# Patient Record
Sex: Male | Born: 1975 | Race: White | Hispanic: No | Marital: Single | Smoking: Current every day smoker
Health system: Southern US, Community
[De-identification: ages and names within clinical notes are randomized; demographics above are authoritative.]

## PROBLEM LIST (undated history)

## (undated) ENCOUNTER — Emergency Department (HOSPITAL_COMMUNITY): Admission: EM | Payer: Medicaid Other | Source: Home / Self Care

## (undated) DIAGNOSIS — J449 Chronic obstructive pulmonary disease, unspecified: Secondary | ICD-10-CM

## (undated) DIAGNOSIS — C801 Malignant (primary) neoplasm, unspecified: Secondary | ICD-10-CM

## (undated) DIAGNOSIS — J189 Pneumonia, unspecified organism: Secondary | ICD-10-CM

## (undated) DIAGNOSIS — I10 Essential (primary) hypertension: Secondary | ICD-10-CM

## (undated) DIAGNOSIS — R569 Unspecified convulsions: Secondary | ICD-10-CM

## (undated) DIAGNOSIS — D7589 Other specified diseases of blood and blood-forming organs: Secondary | ICD-10-CM

## (undated) DIAGNOSIS — R001 Bradycardia, unspecified: Secondary | ICD-10-CM

## (undated) DIAGNOSIS — F1010 Alcohol abuse, uncomplicated: Secondary | ICD-10-CM

## (undated) DIAGNOSIS — K859 Acute pancreatitis without necrosis or infection, unspecified: Secondary | ICD-10-CM

## (undated) DIAGNOSIS — K219 Gastro-esophageal reflux disease without esophagitis: Secondary | ICD-10-CM

## (undated) DIAGNOSIS — M549 Dorsalgia, unspecified: Secondary | ICD-10-CM

## (undated) DIAGNOSIS — I609 Nontraumatic subarachnoid hemorrhage, unspecified: Secondary | ICD-10-CM

## (undated) HISTORY — PX: SEPTOPLASTY: SUR1290

## (undated) HISTORY — PX: BACK SURGERY: SHX140

---

## 2001-03-15 ENCOUNTER — Emergency Department (HOSPITAL_COMMUNITY): Admission: EM | Admit: 2001-03-15 | Discharge: 2001-03-15 | Payer: Self-pay | Admitting: *Deleted

## 2001-05-13 ENCOUNTER — Emergency Department (HOSPITAL_COMMUNITY): Admission: EM | Admit: 2001-05-13 | Discharge: 2001-05-13 | Payer: Self-pay | Admitting: Emergency Medicine

## 2001-05-29 ENCOUNTER — Encounter: Payer: Self-pay | Admitting: Preventative Medicine

## 2001-05-29 ENCOUNTER — Ambulatory Visit (HOSPITAL_COMMUNITY): Admission: RE | Admit: 2001-05-29 | Discharge: 2001-05-29 | Payer: Self-pay | Admitting: Preventative Medicine

## 2001-06-09 ENCOUNTER — Ambulatory Visit (HOSPITAL_COMMUNITY): Admission: RE | Admit: 2001-06-09 | Discharge: 2001-06-09 | Payer: Self-pay | Admitting: Pediatrics

## 2001-06-09 ENCOUNTER — Encounter: Payer: Self-pay | Admitting: Pediatrics

## 2001-06-10 ENCOUNTER — Encounter: Payer: Self-pay | Admitting: *Deleted

## 2001-06-10 ENCOUNTER — Emergency Department (HOSPITAL_COMMUNITY): Admission: EM | Admit: 2001-06-10 | Discharge: 2001-06-10 | Payer: Self-pay | Admitting: *Deleted

## 2001-07-19 ENCOUNTER — Emergency Department (HOSPITAL_COMMUNITY): Admission: EM | Admit: 2001-07-19 | Discharge: 2001-07-19 | Payer: Self-pay | Admitting: Internal Medicine

## 2001-08-04 ENCOUNTER — Emergency Department (HOSPITAL_COMMUNITY): Admission: EM | Admit: 2001-08-04 | Discharge: 2001-08-04 | Payer: Self-pay | Admitting: Emergency Medicine

## 2001-08-10 ENCOUNTER — Emergency Department (HOSPITAL_COMMUNITY): Admission: EM | Admit: 2001-08-10 | Discharge: 2001-08-10 | Payer: Self-pay | Admitting: Internal Medicine

## 2001-09-20 ENCOUNTER — Encounter: Payer: Self-pay | Admitting: Internal Medicine

## 2001-09-20 ENCOUNTER — Emergency Department (HOSPITAL_COMMUNITY): Admission: EM | Admit: 2001-09-20 | Discharge: 2001-09-20 | Payer: Self-pay | Admitting: Internal Medicine

## 2002-04-12 ENCOUNTER — Emergency Department (HOSPITAL_COMMUNITY): Admission: EM | Admit: 2002-04-12 | Discharge: 2002-04-12 | Payer: Self-pay | Admitting: Emergency Medicine

## 2002-04-23 ENCOUNTER — Ambulatory Visit (HOSPITAL_COMMUNITY): Admission: RE | Admit: 2002-04-23 | Discharge: 2002-04-23 | Payer: Self-pay | Admitting: Unknown Physician Specialty

## 2002-04-23 ENCOUNTER — Encounter: Payer: Self-pay | Admitting: Unknown Physician Specialty

## 2002-06-03 ENCOUNTER — Ambulatory Visit (HOSPITAL_COMMUNITY): Admission: RE | Admit: 2002-06-03 | Discharge: 2002-06-03 | Payer: Self-pay | Admitting: Unknown Physician Specialty

## 2003-04-07 ENCOUNTER — Emergency Department (HOSPITAL_COMMUNITY): Admission: EM | Admit: 2003-04-07 | Discharge: 2003-04-07 | Payer: Self-pay | Admitting: Internal Medicine

## 2003-05-02 ENCOUNTER — Emergency Department (HOSPITAL_COMMUNITY): Admission: EM | Admit: 2003-05-02 | Discharge: 2003-05-02 | Payer: Self-pay | Admitting: Internal Medicine

## 2003-06-21 ENCOUNTER — Encounter: Payer: Self-pay | Admitting: *Deleted

## 2003-06-21 ENCOUNTER — Emergency Department (HOSPITAL_COMMUNITY): Admission: EM | Admit: 2003-06-21 | Discharge: 2003-06-21 | Payer: Self-pay | Admitting: *Deleted

## 2003-07-14 ENCOUNTER — Emergency Department (HOSPITAL_COMMUNITY): Admission: EM | Admit: 2003-07-14 | Discharge: 2003-07-14 | Payer: Self-pay | Admitting: Emergency Medicine

## 2003-07-25 ENCOUNTER — Emergency Department (HOSPITAL_COMMUNITY): Admission: EM | Admit: 2003-07-25 | Discharge: 2003-07-25 | Payer: Self-pay | Admitting: Emergency Medicine

## 2004-01-22 ENCOUNTER — Emergency Department (HOSPITAL_COMMUNITY): Admission: EM | Admit: 2004-01-22 | Discharge: 2004-01-22 | Payer: Self-pay | Admitting: Emergency Medicine

## 2004-02-05 ENCOUNTER — Emergency Department (HOSPITAL_COMMUNITY): Admission: EM | Admit: 2004-02-05 | Discharge: 2004-02-06 | Payer: Self-pay

## 2004-06-14 ENCOUNTER — Emergency Department (HOSPITAL_COMMUNITY): Admission: EM | Admit: 2004-06-14 | Discharge: 2004-06-14 | Payer: Self-pay | Admitting: Emergency Medicine

## 2004-09-05 ENCOUNTER — Emergency Department (HOSPITAL_COMMUNITY): Admission: EM | Admit: 2004-09-05 | Discharge: 2004-09-05 | Payer: Self-pay | Admitting: Emergency Medicine

## 2004-10-18 ENCOUNTER — Emergency Department (HOSPITAL_COMMUNITY): Admission: EM | Admit: 2004-10-18 | Discharge: 2004-10-18 | Payer: Self-pay | Admitting: Emergency Medicine

## 2004-10-19 ENCOUNTER — Emergency Department (HOSPITAL_COMMUNITY): Admission: EM | Admit: 2004-10-19 | Discharge: 2004-10-19 | Payer: Self-pay | Admitting: *Deleted

## 2005-03-07 ENCOUNTER — Emergency Department (HOSPITAL_COMMUNITY): Admission: EM | Admit: 2005-03-07 | Discharge: 2005-03-07 | Payer: Self-pay | Admitting: Emergency Medicine

## 2006-04-30 ENCOUNTER — Emergency Department (HOSPITAL_COMMUNITY): Admission: EM | Admit: 2006-04-30 | Discharge: 2006-04-30 | Payer: Self-pay | Admitting: Emergency Medicine

## 2007-03-08 ENCOUNTER — Emergency Department (HOSPITAL_COMMUNITY): Admission: EM | Admit: 2007-03-08 | Discharge: 2007-03-08 | Payer: Self-pay | Admitting: Emergency Medicine

## 2007-03-30 ENCOUNTER — Emergency Department (HOSPITAL_COMMUNITY): Admission: EM | Admit: 2007-03-30 | Discharge: 2007-03-31 | Payer: Self-pay | Admitting: Emergency Medicine

## 2007-04-04 ENCOUNTER — Inpatient Hospital Stay (HOSPITAL_COMMUNITY): Admission: EM | Admit: 2007-04-04 | Discharge: 2007-04-13 | Payer: Self-pay | Admitting: Emergency Medicine

## 2007-04-04 ENCOUNTER — Ambulatory Visit: Payer: Self-pay | Admitting: Internal Medicine

## 2007-04-10 ENCOUNTER — Encounter: Payer: Self-pay | Admitting: Internal Medicine

## 2007-04-20 ENCOUNTER — Emergency Department (HOSPITAL_COMMUNITY): Admission: EM | Admit: 2007-04-20 | Discharge: 2007-04-20 | Payer: Self-pay | Admitting: Emergency Medicine

## 2007-05-01 ENCOUNTER — Ambulatory Visit (HOSPITAL_BASED_OUTPATIENT_CLINIC_OR_DEPARTMENT_OTHER): Admission: RE | Admit: 2007-05-01 | Discharge: 2007-05-01 | Payer: Self-pay | Admitting: Otolaryngology

## 2007-07-10 ENCOUNTER — Emergency Department (HOSPITAL_COMMUNITY): Admission: EM | Admit: 2007-07-10 | Discharge: 2007-07-10 | Payer: Self-pay | Admitting: Emergency Medicine

## 2007-09-09 ENCOUNTER — Emergency Department (HOSPITAL_COMMUNITY): Admission: EM | Admit: 2007-09-09 | Discharge: 2007-09-09 | Payer: Self-pay | Admitting: Emergency Medicine

## 2007-12-08 ENCOUNTER — Emergency Department (HOSPITAL_COMMUNITY): Admission: EM | Admit: 2007-12-08 | Discharge: 2007-12-08 | Payer: Self-pay | Admitting: Emergency Medicine

## 2007-12-12 ENCOUNTER — Emergency Department (HOSPITAL_COMMUNITY): Admission: EM | Admit: 2007-12-12 | Discharge: 2007-12-12 | Payer: Self-pay | Admitting: Emergency Medicine

## 2007-12-20 ENCOUNTER — Emergency Department (HOSPITAL_COMMUNITY): Admission: EM | Admit: 2007-12-20 | Discharge: 2007-12-20 | Payer: Self-pay | Admitting: Emergency Medicine

## 2008-03-06 ENCOUNTER — Encounter: Admission: RE | Admit: 2008-03-06 | Discharge: 2008-03-06 | Payer: Self-pay | Admitting: Neurosurgery

## 2008-03-11 ENCOUNTER — Inpatient Hospital Stay (HOSPITAL_COMMUNITY): Admission: EM | Admit: 2008-03-11 | Discharge: 2008-03-13 | Payer: Self-pay | Admitting: Emergency Medicine

## 2008-04-09 ENCOUNTER — Emergency Department (HOSPITAL_COMMUNITY): Admission: EM | Admit: 2008-04-09 | Discharge: 2008-04-10 | Payer: Self-pay | Admitting: Emergency Medicine

## 2008-06-16 ENCOUNTER — Emergency Department (HOSPITAL_COMMUNITY): Admission: EM | Admit: 2008-06-16 | Discharge: 2008-06-16 | Payer: Self-pay | Admitting: Emergency Medicine

## 2008-08-30 ENCOUNTER — Emergency Department (HOSPITAL_COMMUNITY): Admission: EM | Admit: 2008-08-30 | Discharge: 2008-08-30 | Payer: Self-pay | Admitting: Emergency Medicine

## 2008-10-05 ENCOUNTER — Emergency Department (HOSPITAL_COMMUNITY): Admission: EM | Admit: 2008-10-05 | Discharge: 2008-10-05 | Payer: Self-pay | Admitting: Emergency Medicine

## 2008-10-16 ENCOUNTER — Emergency Department (HOSPITAL_COMMUNITY): Admission: EM | Admit: 2008-10-16 | Discharge: 2008-10-16 | Payer: Self-pay | Admitting: Emergency Medicine

## 2009-02-04 ENCOUNTER — Emergency Department (HOSPITAL_COMMUNITY): Admission: EM | Admit: 2009-02-04 | Discharge: 2009-02-04 | Payer: Self-pay | Admitting: Emergency Medicine

## 2009-04-22 ENCOUNTER — Emergency Department (HOSPITAL_COMMUNITY): Admission: EM | Admit: 2009-04-22 | Discharge: 2009-04-22 | Payer: Self-pay | Admitting: Emergency Medicine

## 2009-06-18 ENCOUNTER — Emergency Department (HOSPITAL_COMMUNITY): Admission: EM | Admit: 2009-06-18 | Discharge: 2009-06-18 | Payer: Self-pay | Admitting: Emergency Medicine

## 2009-12-31 ENCOUNTER — Emergency Department (HOSPITAL_COMMUNITY): Admission: EM | Admit: 2009-12-31 | Discharge: 2009-12-31 | Payer: Self-pay | Admitting: Emergency Medicine

## 2010-02-08 ENCOUNTER — Emergency Department (HOSPITAL_COMMUNITY): Admission: EM | Admit: 2010-02-08 | Discharge: 2010-02-08 | Payer: Self-pay | Admitting: Emergency Medicine

## 2010-02-23 ENCOUNTER — Emergency Department (HOSPITAL_COMMUNITY): Admission: EM | Admit: 2010-02-23 | Discharge: 2010-02-23 | Payer: Self-pay | Admitting: Emergency Medicine

## 2010-12-27 ENCOUNTER — Inpatient Hospital Stay (HOSPITAL_COMMUNITY)
Admission: EM | Admit: 2010-12-27 | Discharge: 2010-12-29 | DRG: 440 | Discharge status: Against Medical Advice | Payer: Medicaid Other | Attending: Internal Medicine | Admitting: Internal Medicine

## 2010-12-27 ENCOUNTER — Emergency Department (HOSPITAL_COMMUNITY): Payer: Medicaid Other

## 2010-12-27 DIAGNOSIS — E876 Hypokalemia: Secondary | ICD-10-CM | POA: Diagnosis present

## 2010-12-27 DIAGNOSIS — Z8673 Personal history of transient ischemic attack (TIA), and cerebral infarction without residual deficits: Secondary | ICD-10-CM

## 2010-12-27 DIAGNOSIS — F1010 Alcohol abuse, uncomplicated: Secondary | ICD-10-CM | POA: Diagnosis present

## 2010-12-27 DIAGNOSIS — M549 Dorsalgia, unspecified: Secondary | ICD-10-CM | POA: Diagnosis present

## 2010-12-27 DIAGNOSIS — G40909 Epilepsy, unspecified, not intractable, without status epilepticus: Secondary | ICD-10-CM | POA: Diagnosis present

## 2010-12-27 DIAGNOSIS — R918 Other nonspecific abnormal finding of lung field: Secondary | ICD-10-CM | POA: Diagnosis present

## 2010-12-27 DIAGNOSIS — G8929 Other chronic pain: Secondary | ICD-10-CM | POA: Diagnosis present

## 2010-12-27 DIAGNOSIS — K859 Acute pancreatitis without necrosis or infection, unspecified: Principal | ICD-10-CM | POA: Diagnosis present

## 2010-12-27 LAB — COMPREHENSIVE METABOLIC PANEL
AST: 29 U/L (ref 0–37)
Albumin: 3.8 g/dL (ref 3.5–5.2)
Alkaline Phosphatase: 109 U/L (ref 39–117)
Chloride: 96 mEq/L (ref 96–112)
GFR calc Af Amer: >60 mL/min (ref >60)
Potassium: 3.3 mEq/L — ABNORMAL LOW (ref 3.5–5.1)
Sodium: 133 mEq/L — ABNORMAL LOW (ref 135–145)
Total Bilirubin: 0.7 mg/dL (ref 0.3–1.2)

## 2010-12-27 LAB — URINALYSIS, ROUTINE W REFLEX MICROSCOPIC
Bilirubin Urine: NEGATIVE
Ketones, ur: 40 mg/dL — AB
Urine Glucose, Fasting: 100 mg/dL — AB
pH: 6.5 (ref 5.0–8.0)

## 2010-12-27 LAB — CBC
RBC: 4.06 MIL/uL — ABNORMAL LOW (ref 4.22–5.81)
RDW: 13.4 % (ref 11.5–15.5)
WBC: 9.3 10*3/uL (ref 4.0–10.5)

## 2010-12-27 LAB — DIFFERENTIAL
Basophils Relative: 0 % (ref 0–1)
Eosinophils Relative: 0 % (ref 0–5)
Lymphocytes Relative: 11 % — ABNORMAL LOW (ref 12–46)
Neutrophils Relative %: 79 % — ABNORMAL HIGH (ref 43–77)

## 2010-12-27 MED ORDER — IOHEXOL 300 MG/ML  SOLN
100.0000 mL | Freq: Once | INTRAMUSCULAR | Status: AC | PRN
  Administered 2010-12-27: 100 mL via INTRAVENOUS

## 2010-12-28 ENCOUNTER — Inpatient Hospital Stay (HOSPITAL_COMMUNITY): Payer: Medicaid Other

## 2010-12-28 LAB — EXPECTORATED SPUTUM ASSESSMENT W GRAM STAIN, RFLX TO RESP C

## 2010-12-28 LAB — CBC
Hemoglobin: 14.8 g/dL (ref 13.0–17.0)
MCH: 35.1 pg — ABNORMAL HIGH (ref 26.0–34.0)
RBC: 4.22 MIL/uL (ref 4.22–5.81)
WBC: 6.8 10*3/uL (ref 4.0–10.5)

## 2010-12-28 LAB — BASIC METABOLIC PANEL
CO2: 25 mEq/L (ref 19–32)
Chloride: 99 mEq/L (ref 96–112)
GFR calc Af Amer: >60 mL/min (ref >60)
Potassium: 3.7 mEq/L (ref 3.5–5.1)
Sodium: 136 mEq/L (ref 135–145)

## 2010-12-28 LAB — DIFFERENTIAL
Basophils Relative: 0 % (ref 0–1)
Monocytes Relative: 13 % — ABNORMAL HIGH (ref 3–12)
Neutro Abs: 4.6 10*3/uL (ref 1.7–7.7)
Neutrophils Relative %: 67 % (ref 43–77)

## 2010-12-28 LAB — LIPID PANEL
Cholesterol: 129 mg/dL (ref 0–200)
HDL: 78 mg/dL (ref >39)
Triglycerides: 54 mg/dL (ref <150)

## 2010-12-29 NOTE — H&P (Signed)
NAMEMILLAN, [name]              ACCOUNT NO.:  [id number]  MEDICAL RECORD NO.:  [id number]           PATIENT TYPE:  I  LOCATION:  A331                          FACILITY:  [facility]  PHYSICIAN:  Hartley Barefoot, MD    DATE OF BIRTH:  1976-08-22  DATE OF ADMISSION:  12/27/2010 DATE OF DISCHARGE:  LH                             HISTORY & PHYSICAL   NEUROSURGEON:  [name], MD.  CHIEF COMPLAINT:  Abdominal pain.  BRIEF HISTORY OF PRESENT ILLNESS:  This is a 35 year old with past medical history of chronic back pain on chronic opioid, history of herniated disk L4-L5, status post right L4-L5, L5-S laminectomy and diskectomy, history of seizure disorders, who presents to [facility] complaining of abdominal pain that started the night prior to admission.  He relates that pain is sharp and tightness in quality, generalized in abdominal area, not radiating, 10/10 in intensity, accompanied by nausea and one episodes of vomiting.  He relates that he drinks a couple of beer a couple of days prior to admission.  He denies any chronic cough.  He has some occasional sporadic cough when he smokes.  Denies weight loss, night sweats, fever, or sick contacts. Denies any recreational drugs or IV drugs.  ALLERGIES:  No known drug allergies.  PAST MEDICAL HISTORY: 1. Seizure disorder. 2. History of herniated disk. 3. Prior history of alcohol withdrawal. 4. History of prior pneumonia in 2008. 5. History of respiratory failure secondary to aspiration pneumonitis     in 2008. 6. History of chronic back pain. 7. Right acute frontal subarachnoid hemorrhage in 2011 March. 8. Contusion.  PAST SURGICAL HISTORY: 1. Septoplasty and closed reduction of nasal fracture. 2. Right L4-L5, L5-S1 laminectomy and diskectomies.  MEDICATIONS: 1. Tegretol 200 mg 2 tablets in the morning, 1 tablet at noon, 1     tablet at night. 2. Keppra 1 tablet in the morning and 1 tablet at night. 3. Tylox 5/500  every 6 hours as needed.  SOCIAL HISTORY:  He is on disability due to seizure disorder.  He is single.  He had a child, but he has not seen him since 15 years ago.  FAMILY HISTORY:  He does not know.  REVIEW OF SYSTEMS:  Negative except as per HPI.  PHYSICAL EXAMINATION:  VITALS:  Blood pressure 145/77, pulse 79, respirations 18, temperature 98.2, sats 98 on room air. GENERAL:  The patient is in no acute distress. HEENT:  Head, atraumatic and normocephalic.  Eyes, anicteric.  Pupils equal and reactive to light.  Extraocular muscle intact. NECK:  Supple.  No JVD.  No carotid fluid. CARDIOVASCULAR:  S1 and S2, regular rhythm and rate.  No rubs, murmurs, or gallops. LUNGS:  Bilateral good air movement.  No wheezing, crackles, or rhonchi. ABDOMEN:  Bowel sounds present.  Soft.  No rigidity.  No guarding. Diffuse tenderness to palpation. EXTREMITY:  Pulse positive.  No edema. NEURO EXAM:  Nonfocal.  LABORATORY DATA:  Admission labs, white blood cell 9.3, hemoglobin 14.0, hematocrit 42.8, platelet 173, ANC 7.4, lipase 1255.  Carbamazepine level 15.4.  Sodium 133, potassium 3.3, chloride 96, bicarb 25,  BUN 4, creatinine 0.54, glucose 124, AST 29, ALT 16.  RADIOGRAPHIC STUDIES: 1. CT of abdomen and pelvis show acute pancreatitis without acute     vascular complication of pseudocyst formation. 2. CT of chest show patchy areas of peribronchial infiltrates, some     which show minimal cavitation, favor this being of infection     etiology.  This could include bacterial process or atypical     infection including fungi.  Recommend CT follow up to ensure     resolution.  Mild emphysematous changes and minimal peribronchial     thickening.  ASSESSMENT AND PLAN: 1. Acute pancreatitis, possibly secondary to alcohol use.  We will     admit the patient to hospital.  We made him n.p.o., bowel rest, IV     fluids, pain medications with Dilaudid.  I will check right upper     quadrant ultrasound  and check lipid panel and alcohol counseling. 2. Seizure disorders.  We will start Keppra IV.  I will ask pharmacy     to find home dose. 3. Bilateral peribronchial infiltrates with minimal cavitation.  The     patient denies chronic cough, night sweats, weight loss.     Differential, discussed with Infectious Disease specialist, Dr.     Maurice March include pneumonia versus tuberculosis versus septic emboli.     We will continue with airborne precautions.  We will start vancomycin     and Zosyn to cover for infectious process.  We will check blood     cultures.  A sputum for AFB x2.  If AFB x2 negative, we will     discontinue the airborne precaution.  If blood cultures positive,     we will need to consider work up for endocarditis and septic emboli.      We will check UDS. 4. Hypokalemia.  will replete potassium with KCl 10 mEq x3 rounds.     We will check mag level.  I will add 20 mEq of potassium to the IV     fluids. 5. Chronic back pain.  Start Dilaudid IV. 6. For deep venous thrombosis prophylaxis, SCDs at this time, no     Lovenox use with prior history of subdural hemorrhage.    Hartley Barefoot, MD     BR/MEDQ  D:  12/27/2010  T:  12/27/2010  Job:  [id number]  Electronically Signed by Hartley Barefoot MD on 12/29/2010 01:12:51 PM

## 2010-12-31 LAB — CULTURE, RESPIRATORY W GRAM STAIN: Culture: NORMAL

## 2011-01-01 LAB — CULTURE, BLOOD (ROUTINE X 2): Culture: NO GROWTH

## 2011-01-02 ENCOUNTER — Emergency Department (HOSPITAL_COMMUNITY)
Admission: EM | Admit: 2011-01-02 | Discharge: 2011-01-02 | Disposition: A | Discharge status: Home / Self Care | Payer: Medicaid Other | Attending: Emergency Medicine | Admitting: Emergency Medicine

## 2011-01-02 DIAGNOSIS — IMO0002 Reserved for concepts with insufficient information to code with codable children: Secondary | ICD-10-CM | POA: Insufficient documentation

## 2011-01-02 LAB — CBC
Hemoglobin: 11.2 g/dL — ABNORMAL LOW (ref 13.0–17.0)
MCH: 35.7 pg — ABNORMAL HIGH (ref 26.0–34.0)
Platelets: 292 10*3/uL (ref 150–400)
RBC: 3.14 MIL/uL — ABNORMAL LOW (ref 4.22–5.81)
WBC: 9.8 10*3/uL (ref 4.0–10.5)

## 2011-01-02 LAB — COMPREHENSIVE METABOLIC PANEL
ALT: 12 U/L (ref 0–53)
AST: 22 U/L (ref 0–37)
Albumin: 3 g/dL — ABNORMAL LOW (ref 3.5–5.2)
Alkaline Phosphatase: 83 U/L (ref 39–117)
CO2: 27 mEq/L (ref 19–32)
Chloride: 96 mEq/L (ref 96–112)
Creatinine, Ser: 0.4 mg/dL (ref 0.4–1.5)
GFR calc Af Amer: >60 mL/min (ref >60)
GFR calc non Af Amer: >60 mL/min (ref >60)
Potassium: 3 mEq/L — ABNORMAL LOW (ref 3.5–5.1)
Total Bilirubin: 0.5 mg/dL (ref 0.3–1.2)

## 2011-01-02 LAB — DIFFERENTIAL
Basophils Relative: 0 % (ref 0–1)
Lymphs Abs: 1.4 10*3/uL (ref 0.7–4.0)
Monocytes Relative: 12 % (ref 3–12)
Neutro Abs: 7 10*3/uL (ref 1.7–7.7)
Neutrophils Relative %: 72 % (ref 43–77)

## 2011-01-09 NOTE — Discharge Summary (Signed)
NAMEOLLIVER, BOYADJIAN              ACCOUNT NO.:  [id number]  MEDICAL RECORD NO.:  [id number]           PATIENT TYPE:  I  LOCATION:  A341                          FACILITY:  [facility]  PHYSICIAN:  Hartley Barefoot, MD    DATE OF BIRTH:  12-Feb-1976  DATE OF ADMISSION:  12/27/2010 DATE OF DISCHARGE:  03/01/2012LH                              DISCHARGE SUMMARY   The patient left against medical advise on December 28, 2010.  DISCHARGE DIAGNOSES: 1. Acute pancreatitis, possible secondary to alcohol use. 2. Seizure disorder. 3. Bilateral peribronchial infiltrate with minimal cavitation,     differential pneumonia versus TB versus septic emboli, unclear     etiology at this time. 4. Hypokalemia. 5. Other past medical history, chronic back pain. 6. History of subarachnoid hemorrhage in 2011. 7. Contusion. 8. History of respiratory failure secondary to aspiration pneumonitis     in 2008. 9. Prior history of alcohol withdrawal.  DISCHARGE MEDICATIONS:  The patient left AMA.  Unable to provide medication.  The patient left overnight.  STUDIES PERFORMED: 1. Right upper quadrant ultrasound, nonvisualization of pancreas, an     hour study to bowel gas.  A slight gallbladder wall thickening,     which is nonspecific in the setting of ascites.  No evidence of     cholelithiasis or biliary obstruction seen. 2. CT abdomen and pelvis February 2012, acute pancreatitis without     acute vascular complications also was cyst formation. 3. CT chest show patchy areas of peribronchial infiltrates, some which     show minimal cavitation, stable, this being of infectious etiology.     These will include bacterial process or atypical infection     including fungal.  Follow-up CT examination of the chest is     recommended to ensure resolution.  Mild emphysematous changes and     minimal peribronchial thickening.  BRIEF HISTORY OF PRESENT ILLNESS:  This is a 35 year old with past medical history of chronic  back pain, on chronic opioid, history of herniated disk, status post laminectomy and diskectomy, history of seizure disorder who presents to [facility] complaining of abdominal pain that started the night prior to admission.  He relate that pain as sharp and tightness in quality, generalized in the abdominal area, nonradiating, 10/10 in intensity.  Accompanied by nausea and one episode of vomiting.  He relate that he drunk a couple of beer a couple of days prior to admission.  He denies any chronic cough.  He has some occasional sporadic cough when he smoke.  Denies night sweats, weight loss, sick contact or any recreational drugs.  HOSPITAL COURSE: 1. Acute pancreatitis possible secondary to alcohol use.  The patient     was admitted to regular floor, he was made n.p.o. bowel rest, IV     fluids, pain medication with Dilaudid 2 mg IV q.3 h. p.r.n.  A     right upper quadrant ultrasound was ordered and it was negative for     cholelithiasis.  Lipid panel showed normal triglyceride at 54.     Over course of hospitalization, the patient continued to have  abdominal pain.  On December 28, 2010, patient decided to leave against     medical advise. 2. History of seizure disorder.  The patient was started on IV Keppra.     Tegretol was on hold due to n.p.o. status.  Keppra dose was     increased to 1000 mg IV b.i.d.  No seizure-like activity during     this hospitalization. 3. Bilateral peribronchial infiltrate with cavitation.  The     differential include pneumonia, tuberculosis, septic emboli.  I     discussed the case with infectious disease, Dr. Maurice March and he     recommend blood cultures to make sure that the patient should have     any bacteremia or evidence of septic emboli.  Also sputum culture     and sputum for FAB acid-fast.  The patient was admitted to airborne     isolation.  Blood cultures no growth to date.  Sputum culture     result is pending.  An HIV test was  negative.  The patient left     against medical advise.  Workup was not able to be completed.  The     patient might need a bronchoscopy if AFB are negative. 4. History of alcohol use.  The patient required Ativan, a total of 3     mg of Ativan.  He was started on thiamine and folate.  No evidence     of DT during this hospitalization. 5. Hypokalemia.  Potassium was repleted. 6. DVT prophylaxis, the patient was on SCDs. 7. Prior history of subdural hemorrhage. 8. The patient left against medical advise.  VITAL SIGNS:  Temperature 97.1, pulse 100, respirations 20, blood pressure 120/85, sat 92 on room air.  Risks and benefits was explained to the patient.     Hartley Barefoot, MD     BR/MEDQ  D:  12/29/2010  T:  12/29/2010  Job:  [id number]  Electronically Signed by Hartley Barefoot MD on 01/09/2011 08:23:38 AM

## 2011-01-12 ENCOUNTER — Emergency Department (HOSPITAL_COMMUNITY)
Admission: EM | Admit: 2011-01-12 | Discharge: 2011-01-13 | Disposition: A | Discharge status: Home / Self Care | Payer: Medicaid Other | Attending: Emergency Medicine | Admitting: Emergency Medicine

## 2011-01-12 DIAGNOSIS — R234 Changes in skin texture: Secondary | ICD-10-CM | POA: Insufficient documentation

## 2011-01-12 DIAGNOSIS — G40909 Epilepsy, unspecified, not intractable, without status epilepticus: Secondary | ICD-10-CM | POA: Insufficient documentation

## 2011-01-12 DIAGNOSIS — R109 Unspecified abdominal pain: Secondary | ICD-10-CM | POA: Insufficient documentation

## 2011-01-12 DIAGNOSIS — IMO0002 Reserved for concepts with insufficient information to code with codable children: Secondary | ICD-10-CM | POA: Insufficient documentation

## 2011-01-17 LAB — DIFFERENTIAL
Lymphocytes Relative: 27 % (ref 12–46)
Lymphs Abs: 1.6 10*3/uL (ref 0.7–4.0)
Monocytes Relative: 11 % (ref 3–12)
Neutro Abs: 3.5 10*3/uL (ref 1.7–7.7)
Neutrophils Relative %: 59 % (ref 43–77)

## 2011-01-17 LAB — CBC
MCV: 107.7 fL — ABNORMAL HIGH (ref 78.0–100.0)
RBC: 4.12 MIL/uL — ABNORMAL LOW (ref 4.22–5.81)
WBC: 5.9 10*3/uL (ref 4.0–10.5)

## 2011-01-17 LAB — BASIC METABOLIC PANEL
Calcium: 9 mg/dL (ref 8.4–10.5)
Chloride: 97 mEq/L (ref 96–112)
Creatinine, Ser: 0.8 mg/dL (ref 0.4–1.5)
GFR calc Af Amer: >60 mL/min (ref >60)

## 2011-01-17 LAB — CARBAMAZEPINE LEVEL, TOTAL: Carbamazepine Lvl: 15.5 ug/mL (ref 4.0–12.0)

## 2011-01-21 LAB — BASIC METABOLIC PANEL
GFR calc Af Amer: >60 mL/min (ref >60)
GFR calc non Af Amer: >60 mL/min (ref >60)
Potassium: 3.7 mEq/L (ref 3.5–5.1)
Sodium: 132 mEq/L — ABNORMAL LOW (ref 135–145)

## 2011-01-21 LAB — VALPROIC ACID LEVEL: Valproic Acid Lvl: 2 ug/mL — ABNORMAL LOW (ref 50.0–100.0)

## 2011-01-21 LAB — ETHANOL: Alcohol, Ethyl (B): 5 mg/dL (ref 0–10)

## 2011-01-26 LAB — FUNGUS CULTURE W SMEAR: Fungal Smear: NONE SEEN

## 2011-02-07 LAB — URINALYSIS, ROUTINE W REFLEX MICROSCOPIC
Glucose, UA: NEGATIVE mg/dL
Hgb urine dipstick: NEGATIVE
Leukocytes, UA: NEGATIVE
Protein, ur: 30 mg/dL — AB

## 2011-02-07 LAB — DIFFERENTIAL
Basophils Relative: 0 % (ref 0–1)
Eosinophils Absolute: 0 10*3/uL (ref 0.0–0.7)
Eosinophils Relative: 0 % (ref 0–5)
Monocytes Relative: 7 % (ref 3–12)
Neutrophils Relative %: 83 % — ABNORMAL HIGH (ref 43–77)

## 2011-02-07 LAB — COMPREHENSIVE METABOLIC PANEL
ALT: 26 U/L (ref 0–53)
AST: 51 U/L — ABNORMAL HIGH (ref 0–37)
Alkaline Phosphatase: 108 U/L (ref 39–117)
CO2: 30 mEq/L (ref 19–32)
GFR calc non Af Amer: >60 mL/min (ref >60)
Glucose, Bld: 165 mg/dL — ABNORMAL HIGH (ref 70–99)
Potassium: 3.7 mEq/L (ref 3.5–5.1)
Sodium: 132 mEq/L — ABNORMAL LOW (ref 135–145)
Total Protein: 7.3 g/dL (ref 6.0–8.3)

## 2011-02-07 LAB — CBC
Hemoglobin: 15.6 g/dL (ref 13.0–17.0)
RBC: 4.23 MIL/uL (ref 4.22–5.81)

## 2011-02-09 LAB — AFB CULTURE WITH SMEAR (NOT AT ARMC): Acid Fast Smear: NONE SEEN

## 2011-03-03 ENCOUNTER — Emergency Department (HOSPITAL_COMMUNITY)
Admission: EM | Admit: 2011-03-03 | Discharge: 2011-03-03 | Disposition: A | Discharge status: Home / Self Care | Payer: Medicaid Other | Attending: Emergency Medicine | Admitting: Emergency Medicine

## 2011-03-03 DIAGNOSIS — IMO0002 Reserved for concepts with insufficient information to code with codable children: Secondary | ICD-10-CM | POA: Insufficient documentation

## 2011-03-03 DIAGNOSIS — R630 Anorexia: Secondary | ICD-10-CM | POA: Insufficient documentation

## 2011-03-03 DIAGNOSIS — G40802 Other epilepsy, not intractable, without status epilepticus: Secondary | ICD-10-CM | POA: Insufficient documentation

## 2011-03-03 DIAGNOSIS — R1013 Epigastric pain: Secondary | ICD-10-CM | POA: Insufficient documentation
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
## 2011-03-13 NOTE — Consult Note (Signed)
NAMEDAMONTA, COSSEY NO.:  [id number]   MEDICAL RECORD NO.:  [id number]          PATIENT TYPE:  INP   LOCATION:  3034                         FACILITY:  [facility]   PHYSICIAN:  Stefani Dama, M.D.  DATE OF BIRTH:  1976-06-16   DATE OF CONSULTATION:  03/11/2008  DATE OF DISCHARGE:                                 CONSULTATION   REQUESTING PHYSICIAN:  Dr. Lynelle Doctor.   REASON FOR REQUEST:  History of herniated nucleus pulposus L4-L5 and L5-  S1.   HISTORY OF PRESENT ILLNESS:  [name] is a 35 year old individual  who has a history of seizures.  Apparently, a few months ago, he had a  seizure and when he woke up, he noted significant back pain over the  last several weeks.  He notes that the back pain has been persistently  getting worse with pain radiating into the right buttock and right lower  extremity.  A few days ago, an MRI of the lumbar spine was ordered after  he contacted Dr. Temple Pacini office.  The MRI demonstrates large herniated  nucleus pulposus at L4-L5 and L5-S1 both on the right side with  compression of the right side at L4-L5 and the S1 nerve roots.  He  presented to the emergency room because of pain, it is intractable.  He  had been taking Tylox fairly regularly to help control the pain and  despite this was not receiving adequate control.  In the emergency  department, he has been given significant parenteral medications  including Dilaudid, which has not yielded substantial control of this  pain.  He is now to be admitted to undergo pain control and surgical  extirpation of these 2 large herniated nucleus pulposus.  For other  details of his history and physical examination, please see that  dictation, it will also give history of his past medical history.   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      Stefani Dama, M.D.  Electronically Signed     HJE/MEDQ  D:  03/11/2008  T:   03/12/2008  Job:  [id number]

## 2011-03-13 NOTE — Op Note (Signed)
NAMESYLVAN, SOOKDEO NO.:  [id number]   MEDICAL RECORD NO.:  [id number]          PATIENT TYPE:  INP   LOCATION:  3034                         FACILITY:  [facility]   PHYSICIAN:  [name], M.D.      DATE OF BIRTH:  1975-11-05   DATE OF PROCEDURE:  03/12/2008  DATE OF DISCHARGE:                               OPERATIVE REPORT   PREOPERATIVE DIAGNOSIS:  Herniated disk, right side at L4-L5 and L5-S1.   POSTOPERATIVE DIAGNOSIS:  Herniated disk, right side at L4-L5 and L5-S1.   PROCEDURE:  Right L4-L5, L5-S1 laminectomy and diskectomy.   SURGEON:  [name], MD.   ANESTHESIA:  Endotracheal.   PREPARATION:  Prepped and draped with alcohol wipe.   COMPLICATIONS:  None.   NURSE ASSISTANT:  [place].   DOCTOR ASSISTANT:  Hewitt Shorts, MD.   BODY OF TEXT:  This 35 year old gentleman with herniated disk at both L4-  L5 and L5-S1 on the right, taken to the operating room, smoothly  anesthetized and intubated, placed prone on the operating table.  Following shave, prep and drape in the usual sterile fashion, the skin  was infiltrated 1% lidocaine with 1:400,000 epinephrine.  The skin was  incised from top L4 to the bottom of L5.  The lamina of L4 and L5 were  exposed on the right side in subperiosteal plane.  An intraoperative x-  ray confirmed correct level.  Having confirmed correct level, hemi/semi-  laminotomy was carried out with the drill to the top of ligamentum  flavum, first at L5 and then L4.  The ligamentum was removed in  retrograde fashion, exposing the lateral margin sac at L5-S1.  The S1  nerve root was identified, dissected free, retracted medially, and there  was fragmented disk lying just under the axilla of the root.  This was  removed without difficulty.  Disk space was explored and found to have  no graspable fragments.  Attention was then turned to L4-L5 disk space  where the disk was migrated inferiorly.  The disk was opened with a 15  blade, this evacuated and then reaching across the midline with the  Epstein curette, the disk material in the anterior epidural space was  pushed and the disk space removed that away.  This allowed decompression  of the anterior epidural space as well as a 5 root on the right side.  After complete decompression, wound was irrigated.  Hemostasis assured.  Both nerves roots were inspected in all quadrants.  Successive layers of  0 Vicryl, 2-0 Vicryl, and 3-0 nylon were used to close.  Betadine and  Telfa dressing was applied and the patient returned to the recovery room  in good condition.           ______________________________  [name], M.D.     MWR/MEDQ  D:  03/12/2008  T:  03/13/2008  Job:  [id number]

## 2011-03-13 NOTE — Op Note (Signed)
NAMEBECKETT, MADEN              ACCOUNT NO.:  [id number]   MEDICAL RECORD NO.:  [id number]          PATIENT TYPE:  AMB   LOCATION:  DSC                          FACILITY:  [facility]   PHYSICIAN:  Suzanna Obey, M.D.       DATE OF BIRTH:  Oct 12, 1976   DATE OF PROCEDURE:  05/01/2007  DATE OF DISCHARGE:                               OPERATIVE REPORT   PREOPERATIVE DIAGNOSES:  1. Deviated septum and nasal fracture.  2. Right zygomatic arch fracture.   POSTOPERATIVE DIAGNOSES:  1. Deviated septum and nasal fracture.  2. Right zygomatic arch fracture.   SURGICAL PROCEDURE:  Septoplasty and closed reduction of nasal fracture  and Gillies approach to right zygomatic arch fracture.   ANESTHESIA:  Excellent.   ESTIMATED BLOOD LOSS:  Approximately 5 mL.   INDICATION:  This is a 35 year old who has had an injury that required  hospitalization previously and he now is approximately 2 to 3 weeks out  from his injury.  He has a significant deviation of his septum to the  left and a nasal fracture deviating to the right.  Some of the deviation  of his dorsum was old.  He actually says some of the deviation is better  on the dorsum than it was previously.  He also has a right zygomatic  arch fracture with depression.  He has a little bit of trismus with  opening and pain in the right zygomatic region.  He was informed of the  risks and benefits of the procedure.  He understands options.  All of  his questions were answered and consent was obtained.   OPERATION:  The patient was taken to the operating room and place in the  supine position after adequate general endotracheal tube anesthesia,  prepped and draped in the usual sterile manner.  The septum and  columella was injected with 1% lidocaine with 1:100,000 epinephrine.  A  left hemitransfixion incision was performed raising the  mucoperichondrial and ostial flap.  The cartilage was divided about 1 cm  posterior to the caudal strut and a  severe deviated septum cartilage was  removed.  The deviated portion of the bone was removed posteriorly.  The  spur was removed with a 4-mm osteotome.  The septum columella was then  freed up and moved back into the midline.  It was then secured with a 4-  0 plain gut quilting suture.  The hemitransfixion incision was closed  with interrupted 4-0 chromic and a quilting stitch placed through the  septum posteriorly.  Telfa roll soaked in bacitracin were placed into  the nose bilaterally and secured with a 3-0 nylon.  The nasal fracture  was reduced prior to the packing insertion with a nasal elevator and  bones were moved on the left side back into the anatomic position and  the right side was moved but it would not significantly change just  slightly.  The bone that was still deviated seemed like an old fracture.  Steri-Strips and benzoin were placed and a cast.   The direction of the operation was taken to the right  side where in the  temporalis region, the incision was made in the hairline, dissected down  to the temporalis fascia and it was divided.  An elevator was placed  medial to the fascia and drawn down underneath the arch.  The arch was  positioned back in its anatomic position by palpation with the  elevator.  It seemed to be a nice contour.  The wound was then closed  with interrupted suture of 5-0 nylon.  The patient was then suctioned  out of all blood and debris from the oral cavity and oropharynx.  He was  awakened, brought to recovery in stable condition.  Counts correct.           ______________________________  Suzanna Obey, M.D.     JB/MEDQ  D:  05/01/2007  T:  05/01/2007  Job:  [id number]

## 2011-03-13 NOTE — H&P (Signed)
[name], [name]:  [id number]   MEDICAL RECORD NO.:  [id number]          PATIENT TYPE:  INP   LOCATION:  3034                         FACILITY:  [facility]   PHYSICIAN:  Stefani Dama, M.D.  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   HISTORY OF PRESENT ILLNESS:  [name] is of 35 year old right-  handed individual who has had chronic back pain for several months.  He  notes that a few months ago he had a seizure.  When he woke up he had  significant back pain.  Over the past few weeks the back pain started  radiating to the right buttock and right lower extremity.  He contacted  Dr. Temple Pacini office and an MRI was ordered as an outpatient.  MRI  demonstrates the patient has large herniated nucleus pulposus at L4-5  and L5-S1 on the right side with elevation and compression particularly  for the L5 and the S1 nerve roots on that right side, but there may be  some involvement in the L4 nerve root out laterally.  The patient has  noted significant pain and weakness, particularly when he stands or  bears weight for any length of time.  He notes that he feels weak in  that right leg also.  The Tylox that he had been given for chronic back  pain does not seem to be helping this process and because of this, he  presented to the emergency room this evening.   PAST MEDICAL HISTORY:  His past medical history is notable for history  of seizures that he has had for over 15 years period of time.  The onset  of seizures is unknown, but he has been under reasonable control with  Tegretol 200 mg q.8 h.  He denies the use of alcohol.  He denies the use  of illicit drugs.  He only uses the Tylox for back pain control.   ALLERGIES:  HE NOTES NO ALLERGIES TO ANY MEDICATIONS.   Denies  any other medications that he takes on a regular basis.   PERSONAL HISTORY:  He lives alone.  He has been disabled because of his  seizures and now his back pain.   PHYSICAL EXAMINATION:  Reveals that he is an alert, oriented and  cooperative individual who is fairly disheveled.  He will stand with a  10 degree forward stoop and he cannot straighten into the erect  position.  He has a functional scoliosis of his lumbar spine with a  significant amount of paravertebral spasm and tenderness to palpation in  his back across the levels of L4-5 and L5-S.  Percussion tenderness also  reproduces substantial pain that he notes radiates in the buttock and  hip.  His motor strength reveals the iliopsoas and quad seems to be  strong to confrontation.  However, the tibialis anterior appears weak to  4/5.  The gastroc strength appears intact.  Deep tendon reflexes are 2+  in the patellae, absent in the left Achilles, 1+ in the right Achilles.  Babinski downgoing.  Sensation appears intact to vibration in the lower  extremities.  Straight leg raising reproduces significant back and right  lower extremity pain at 15 degrees.  Patrick maneuver is difficult on  the right side as he guards considerably,  negative on the left side.  Upper extremity strength and reflexes are within the limits of normal.  GENERAL:  Reveals that he is an alert, oriented, disheveled individual.  He is somewhat asthenic and thin.  His pupils are 4 mm briskly reactive  to light and accommodation.  The extraocular movements are full and the  face is symmetric to grimace.  Tongue and uvula are in the midline.  Sclerae and conjunctivae are clear.  LUNGS:  Clear to auscultation.  HEART:  Has a regular rate and rhythm.  No murmur is heard.  ABDOMEN:  Soft.  Bowel sounds are positive.  No masses are noted.  EXTREMITIES:  Reveal no cyanosis, clubbing or edema.   IMPRESSION:  The patient has evidence of herniated nucleus pulposus at   L4-5 and L5-S1.  Now to be admitted for pain control.  However, on  reviewing the case I noted that he is an appropriate candidate for  surgical intervention.  I discussed this with Dr. Channing Mutters by phone.  He will  be added to the surgical schedule for the morning.      Stefani Dama, M.D.  Electronically Signed     HJE/MEDQ  D:  03/11/2008  T:  03/11/2008  Job:  [id number]
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
## 2011-03-15 ENCOUNTER — Emergency Department (HOSPITAL_COMMUNITY)
Admission: EM | Admit: 2011-03-15 | Discharge: 2011-03-15 | Disposition: A | Discharge status: Home / Self Care | Payer: Medicaid Other | Attending: Emergency Medicine | Admitting: Emergency Medicine

## 2011-03-15 DIAGNOSIS — R109 Unspecified abdominal pain: Secondary | ICD-10-CM | POA: Insufficient documentation

## 2011-03-15 DIAGNOSIS — K861 Other chronic pancreatitis: Secondary | ICD-10-CM | POA: Insufficient documentation
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
## 2011-03-16 NOTE — Procedures (Signed)
   NAME:  ALBERTUS, CHIARELLI                        ACCOUNT NO.:  [id number]   MEDICAL RECORD NO.:  [id number]                   PATIENT TYPE:  OUT   LOCATION:  RESP                                 FACILITY:  [facility]   PHYSICIAN:  Kofi A. Gerilyn Pilgrim, M.D.              DATE OF BIRTH:  [birth year]   DATE OF PROCEDURE:  06/03/2002  DATE OF DISCHARGE:  06/03/2002                                EEG INTERPRETATION   HISTORY:  The patient is suspected of having spells, possibly a seizure that  could be epileptic.   ANALYSIS:  A 16-channel recording was conducted for approximately 20  minutes.  There is a posterior rhythm of 10.5-11 hertz that attenuates with  eye opening.  There is beta activity seen in the frontal eye fields.  Awake  and drowsy architecture is seen.  Photic stimulation did not elicit any  abnormal responses.  There is no clear focal or lateralized slowing or  epileptiform activity seen.   IMPRESSION:  A normal recording of the awake and drowsy state. There is no  focal slowing or epileptiform activity seen.  If clinically indicated, a  repeat sleep and prior recording may be useful.                                               Kofi A. Gerilyn Pilgrim, M.D.    KAD/MEDQ  D:  06/03/2002  T:  06/06/2002  Job:  [id number]

## 2011-04-19 ENCOUNTER — Emergency Department (HOSPITAL_COMMUNITY): Payer: Medicaid Other

## 2011-04-19 ENCOUNTER — Inpatient Hospital Stay (HOSPITAL_COMMUNITY)
Admission: EM | Admit: 2011-04-19 | Discharge: 2011-04-22 | DRG: 439 | Disposition: A | Discharge status: Home / Self Care | Payer: Medicaid Other | Attending: Internal Medicine | Admitting: Internal Medicine

## 2011-04-19 ENCOUNTER — Inpatient Hospital Stay (HOSPITAL_COMMUNITY): Payer: Medicaid Other

## 2011-04-19 DIAGNOSIS — K859 Acute pancreatitis without necrosis or infection, unspecified: Principal | ICD-10-CM | POA: Diagnosis present

## 2011-04-19 DIAGNOSIS — E8779 Other fluid overload: Secondary | ICD-10-CM | POA: Diagnosis not present

## 2011-04-19 DIAGNOSIS — F10931 Alcohol use, unspecified with withdrawal delirium: Secondary | ICD-10-CM | POA: Diagnosis not present

## 2011-04-19 DIAGNOSIS — L02219 Cutaneous abscess of trunk, unspecified: Secondary | ICD-10-CM | POA: Diagnosis present

## 2011-04-19 DIAGNOSIS — IMO0002 Reserved for concepts with insufficient information to code with codable children: Secondary | ICD-10-CM | POA: Diagnosis present

## 2011-04-19 DIAGNOSIS — L089 Local infection of the skin and subcutaneous tissue, unspecified: Secondary | ICD-10-CM | POA: Diagnosis present

## 2011-04-19 DIAGNOSIS — F10231 Alcohol dependence with withdrawal delirium: Secondary | ICD-10-CM | POA: Diagnosis not present

## 2011-04-19 DIAGNOSIS — I10 Essential (primary) hypertension: Secondary | ICD-10-CM | POA: Diagnosis present

## 2011-04-19 DIAGNOSIS — Y929 Unspecified place or not applicable: Secondary | ICD-10-CM

## 2011-04-19 DIAGNOSIS — G40909 Epilepsy, unspecified, not intractable, without status epilepticus: Secondary | ICD-10-CM | POA: Diagnosis present

## 2011-04-19 DIAGNOSIS — F10229 Alcohol dependence with intoxication, unspecified: Secondary | ICD-10-CM | POA: Diagnosis present

## 2011-04-19 DIAGNOSIS — J4489 Other specified chronic obstructive pulmonary disease: Secondary | ICD-10-CM | POA: Diagnosis present

## 2011-04-19 DIAGNOSIS — J449 Chronic obstructive pulmonary disease, unspecified: Secondary | ICD-10-CM | POA: Diagnosis present

## 2011-04-19 DIAGNOSIS — G894 Chronic pain syndrome: Secondary | ICD-10-CM | POA: Diagnosis present

## 2011-04-19 DIAGNOSIS — F17200 Nicotine dependence, unspecified, uncomplicated: Secondary | ICD-10-CM | POA: Diagnosis present

## 2011-04-19 DIAGNOSIS — Z8701 Personal history of pneumonia (recurrent): Secondary | ICD-10-CM

## 2011-04-19 LAB — URINALYSIS, ROUTINE W REFLEX MICROSCOPIC
Hgb urine dipstick: NEGATIVE
Nitrite: NEGATIVE
Protein, ur: NEGATIVE mg/dL
Specific Gravity, Urine: 1.02 (ref 1.005–1.030)
Urobilinogen, UA: 0.2 mg/dL (ref 0.0–1.0)

## 2011-04-19 LAB — COMPREHENSIVE METABOLIC PANEL
BUN: 5 mg/dL — ABNORMAL LOW (ref 6–23)
CO2: 24 mEq/L (ref 19–32)
Calcium: 9.7 mg/dL (ref 8.4–10.5)
Chloride: 95 mEq/L — ABNORMAL LOW (ref 96–112)
Creatinine, Ser: <0.47 mg/dL — ABNORMAL LOW (ref 0.50–1.35)
Glucose, Bld: 98 mg/dL (ref 70–99)
Total Bilirubin: 0.7 mg/dL (ref 0.3–1.2)

## 2011-04-19 LAB — CBC
HCT: 45.2 % (ref 39.0–52.0)
Hemoglobin: 16.1 g/dL (ref 13.0–17.0)
MCH: 39.5 pg — ABNORMAL HIGH (ref 26.0–34.0)
MCV: 110.8 fL — ABNORMAL HIGH (ref 78.0–100.0)
RBC: 4.08 MIL/uL — ABNORMAL LOW (ref 4.22–5.81)
WBC: 12.4 10*3/uL — ABNORMAL HIGH (ref 4.0–10.5)

## 2011-04-19 LAB — ETHANOL: Alcohol, Ethyl (B): 40 mg/dL — ABNORMAL HIGH (ref 0–11)

## 2011-04-19 LAB — LIPASE, BLOOD: Lipase: 1130 U/L — ABNORMAL HIGH (ref 11–59)

## 2011-04-19 MED ORDER — IOHEXOL 300 MG/ML  SOLN
100.0000 mL | Freq: Once | INTRAMUSCULAR | Status: AC | PRN
  Administered 2011-04-19: 100 mL via INTRAVENOUS
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
## 2011-04-20 ENCOUNTER — Inpatient Hospital Stay (HOSPITAL_COMMUNITY): Payer: Medicaid Other

## 2011-04-20 LAB — CBC
HCT: 43.3 % (ref 39.0–52.0)
MCHC: 33.7 g/dL (ref 30.0–36.0)
MCV: 113.1 fL — ABNORMAL HIGH (ref 78.0–100.0)
Platelets: 202 10*3/uL (ref 150–400)
RDW: 12.8 % (ref 11.5–15.5)
WBC: 8.5 10*3/uL (ref 4.0–10.5)

## 2011-04-20 LAB — LIPID PANEL
Cholesterol: 117 mg/dL (ref 0–200)
HDL: 46 mg/dL (ref >39)
Total CHOL/HDL Ratio: 2.5 RATIO
Triglycerides: 79 mg/dL (ref <150)

## 2011-04-20 LAB — DIFFERENTIAL
Basophils Absolute: 0 10*3/uL (ref 0.0–0.1)
Eosinophils Absolute: 0.2 10*3/uL (ref 0.0–0.7)
Eosinophils Relative: 3 % (ref 0–5)
Lymphocytes Relative: 17 % (ref 12–46)
Lymphs Abs: 1.4 10*3/uL (ref 0.7–4.0)
Monocytes Absolute: 0.8 10*3/uL (ref 0.1–1.0)

## 2011-04-20 LAB — COMPREHENSIVE METABOLIC PANEL
AST: 38 U/L — ABNORMAL HIGH (ref 0–37)
Albumin: 3.5 g/dL (ref 3.5–5.2)
BUN: 3 mg/dL — ABNORMAL LOW (ref 6–23)
Calcium: 8.7 mg/dL (ref 8.4–10.5)
Creatinine, Ser: <0.74 mg/dL (ref 0.50–1.35)
Total Protein: 6.6 g/dL (ref 6.0–8.3)

## 2011-04-20 LAB — LIPASE, BLOOD: Lipase: 785 U/L — ABNORMAL HIGH (ref 11–59)

## 2011-04-20 LAB — MAGNESIUM: Magnesium: 1.8 mg/dL (ref 1.5–2.5)

## 2011-04-21 LAB — COMPREHENSIVE METABOLIC PANEL
AST: 35 U/L (ref 0–37)
Albumin: 3.2 g/dL — ABNORMAL LOW (ref 3.5–5.2)
BUN: 3 mg/dL — ABNORMAL LOW (ref 6–23)
CO2: 29 mEq/L (ref 19–32)
Calcium: 8.4 mg/dL (ref 8.4–10.5)
Creatinine, Ser: <0.47 mg/dL — ABNORMAL LOW (ref 0.50–1.35)

## 2011-04-21 LAB — DIFFERENTIAL
Basophils Relative: 0 % (ref 0–1)
Eosinophils Absolute: 0.3 10*3/uL (ref 0.0–0.7)
Eosinophils Relative: 3 % (ref 0–5)
Lymphs Abs: 1.3 10*3/uL (ref 0.7–4.0)
Monocytes Absolute: 1 10*3/uL (ref 0.1–1.0)
Neutro Abs: 6.5 10*3/uL (ref 1.7–7.7)

## 2011-04-21 LAB — CBC
HCT: 37 % — ABNORMAL LOW (ref 39.0–52.0)
MCH: 38.2 pg — ABNORMAL HIGH (ref 26.0–34.0)
MCV: 113.8 fL — ABNORMAL HIGH (ref 78.0–100.0)
Platelets: 167 10*3/uL (ref 150–400)
RDW: 12.7 % (ref 11.5–15.5)

## 2011-04-21 LAB — LIPASE, BLOOD: Lipase: 232 U/L — ABNORMAL HIGH (ref 11–59)

## 2011-04-22 LAB — DIFFERENTIAL
Basophils Relative: 0 % (ref 0–1)
Eosinophils Absolute: 0.3 10*3/uL (ref 0.0–0.7)
Lymphocytes Relative: 16 % (ref 12–46)
Monocytes Absolute: 1 10*3/uL (ref 0.1–1.0)
Neutrophils Relative %: 70 % (ref 43–77)

## 2011-04-22 LAB — BASIC METABOLIC PANEL
CO2: 29 mEq/L (ref 19–32)
Chloride: 96 mEq/L (ref 96–112)
Creatinine, Ser: <0.47 mg/dL — ABNORMAL LOW (ref 0.50–1.35)
Potassium: 3.2 mEq/L — ABNORMAL LOW (ref 3.5–5.1)
Sodium: 137 mEq/L (ref 135–145)

## 2011-04-22 LAB — CBC
MCV: 112.8 fL — ABNORMAL HIGH (ref 78.0–100.0)
Platelets: 154 10*3/uL (ref 150–400)
RBC: 3.36 MIL/uL — ABNORMAL LOW (ref 4.22–5.81)
RDW: 12.7 % (ref 11.5–15.5)
WBC: 8.8 10*3/uL (ref 4.0–10.5)

## 2011-04-22 LAB — LIPASE, BLOOD: Lipase: 107 U/L — ABNORMAL HIGH (ref 11–59)

## 2011-04-23 NOTE — Discharge Summary (Signed)
NAMEJAYCEION, LISENBY NO.:  [id number]  MEDICAL RECORD NO.:  [id number]  LOCATION:  A339                          FACILITY:  [facility]  PHYSICIAN:  Isidor Holts, M.D.  DATE OF BIRTH:  05/08/1976  DATE OF ADMISSION:  04/19/2011 DATE OF DISCHARGE:  06/24/2012LH                              DISCHARGE SUMMARY   PRIMARY MD:  None.  PRIMARY NEUROSURGEON:  Dr. Trey Sailors.  DISCHARGE DIAGNOSES: 1. Recurrent acute pancreatitis. 2. Acute alcoholic intoxication. 3. Chronic alcohol abuse/withdrawal. 4. Delirium tremens. 5. History of seizure disorder. 6. Hypertension. 7. Smoking history. 8. Chronic obstructive pulmonary disease. 9. History of subarachnoid hemorrhage in March 2011. 10.History of chronic back pain - degenerative joint disease. 11.Infected tick bite/cellulitis left upper back.  DISCHARGE MEDICATIONS: 1. Doxycycline 100 mg p.o. b.i.d. for 11 days. 2. Folic acid 1 mg p.o. daily. 3. Lorazepam 1 mg p.o. t.i.d. for one daily, then 1 mg p.o. b.i.d. for     one day, and 1 mg p.o. on the third day. 4. Protonix 40 mg p.o. daily for 7 days. 5. Thiamine 100 mg p.o. daily. 6. Ibuprofen (200 mg) 1 tablet p.o. p.r.n. daily. 7. Keppra 500 mg p.o. b.i.d. 8. Tegretol 400 mg p.o. q.a.m., 200 mg p.o. at noon, and 200 mg p.o.     nightly. 9. Tylox (5/500) 1 p.o. p.r.n. q.6 h. for pain.  PROCEDURES: 1. Chest x-ray on April 19, 2011.  This showed no change in     hyperaeration and mild peribronchial thickening.  No active     process. 2. Abdominal/pelvic CT scan on April 19, 2011.  This showed findings     compatible with acute pancreatitis.  This is formation of     pancreatic necrosis.  Moderate abdominal and pelvic ascites. 3. Chest x-ray on April 20, 2011.  This showed no active lung disease.     No change in hyperaeration.  CONSULTATIONS:  None.  ADMISSION HISTORY:  As in H and P notes of April 19, 2011, dictated by this MD.  However, in brief, this is a  35 year old male, with known history of acute pancreatitis in February 2012, cavitating pneumonia February 2012, incompletely treated/worked up, as the patient left AMA at the time, L4-5 herniated disk, status post L4-5/L5-S1, laminectomy, diskectomy, chronic back pain, status post septoplasty and closed reduction of nasal fracture, alcohol abuse/previous history of alcohol withdrawal seizure disorder, history of pneumonia complicated by respiratory failure in 2008, history of right acute frontal subarachnoid hemorrhage in March 2011, smoking history.  Presenting with a 2-day history of epigastric pain of sudden onset, associated with nausea.  He had no vomiting, but self-induced vomiting about two to three times.  The patient is a known heavy drinker, drinking about 5-6 beers per day.  His last drink was in the evening of April 18, 2011.  On initial evaluation in the emergency department, he was found to have a lipase of 1130.  Alcohol level was 40.  He was admitted for further evaluation, investigation, and management.  CLINICAL COURSE: 1. Recurrent acute pancreatitis.  The patient presents as described     above.  Lipase level was markedly elevated, consistent with acute  pancreatitis, which was confirmed by abdominal/pelvic CT scan done     on the same date.  He was managed with bowel rest, intravenous     fluid hydration, proton pump inhibitor treatment, and opioid     analgesics.  Over the course of his hospitalization, there was     steady diminution in lipase levels down to 107 on April 22, 2011.     On that date, the patient insisted that he was very hungry.  He was     tried with low-fat diet which he tolerated quite well and abdominal     pain had significantly ameliorated.  2. Acute alcoholic intoxication.  The patient presented with an     alcohol level 40.  He was placed on thiamine supplementation as     well as folic acid levels and intravenous fluid hydration.  As  of     April 20, 2011, acute alcoholic intoxication had resolved.  3. Chronic alcohol abuse/withdrawal and delirium tremens.  The patient     does drink quite heavily, approximately 5-6 beers per day.  He started     showing evidence of alcohol withdrawal phenomena on April 18, 2011.  He was placed on the CIWA protocol and required intermittent     injections of Ativan. By April 21, 2011, he was in full-blown     delirium tremens with tremor, confusion and agitation, requiring     addition of Haldol to his tranquilizers.  As a matter of fact,     because he attempted to leave hospital a number of times, he was     involuntarily committed because in his delirious state, it was felt     that he was temporarily mentally incompetent and lacked capacity to make decisions.  Fortunately, by April 22, 2011, delirium had disappeared, mental status appeared to be at baseline.  The patient has been switched to oral Ativan taper.  1. Smoking history.  The patient continues to smoke.  He was counseled     appropriately and was managed with NicoDerm CQ patch.  2. History of seizure disorder.  The patient continued on his     preadmission anticonvulsants, and no seizure episodes were recorded     during this hospitalization.  3. Hypertension.  The patient remained normotensive.  4. Chronic back pain.  This did not prove problematic.  The patient     was managed with opioid analgesics.  5. Infected tick bite/cellulitis left upper back.  The patient was     found to have on his skin, several tick bites.  Apparently the     patient had been scratching up them. In his left upper back, he had     an area which appeared infected, with peri-focal erythema and     tenderness, consistent with cellulitis. He has been placed      on a 14-day course of doxycycline and as of April 22, 2011 was on day #3 of treatment.    DISPOSITION:  The patient was on April 22, 2011, considered clinically stable for  discharge and was very keen to be discharged.  He showed no further evidence of alcohol withdrawal phenomena and was tolerating a low-fat diet. He was therefore considered stable for discharge and discharged accordingly.  ACTIVITY:  As tolerated.  Recommended to increase activity slowly.  DIET:  Low-fat diet for at least 2 weeks.  FOLLOWUP INSTRUCTIONS:  The patient is to follow  up routinely with his neurosurgeon, Dr. Trey Sailors, with prior scheduled appointment.  SPECIAL INSTRUCTIONS:  The patient has been supplied information for [facility] substance abuse counseling telephone number [phone]- [phone].  He was instructed to call to make an appointment and has verbalized understanding.     Isidor Holts, M.D.     CO/MEDQ  D:  04/22/2011  T:  04/22/2011  Job:  [id number]  cc:   [name], M.D. Fax: [phone]  Electronically Signed by Isidor Holts M.D. on 04/23/2011 11:16:29 AM

## 2011-06-10 ENCOUNTER — Emergency Department (HOSPITAL_COMMUNITY)
Admission: EM | Admit: 2011-06-10 | Discharge: 2011-06-10 | Disposition: A | Discharge status: Home / Self Care | Payer: Medicaid Other | Attending: Emergency Medicine | Admitting: Emergency Medicine

## 2011-06-10 ENCOUNTER — Emergency Department (HOSPITAL_COMMUNITY): Payer: Medicaid Other

## 2011-06-10 DIAGNOSIS — R1013 Epigastric pain: Secondary | ICD-10-CM | POA: Insufficient documentation

## 2011-06-10 DIAGNOSIS — K859 Acute pancreatitis without necrosis or infection, unspecified: Secondary | ICD-10-CM | POA: Insufficient documentation

## 2011-06-10 DIAGNOSIS — R569 Unspecified convulsions: Secondary | ICD-10-CM | POA: Insufficient documentation

## 2011-06-10 DIAGNOSIS — F17200 Nicotine dependence, unspecified, uncomplicated: Secondary | ICD-10-CM | POA: Insufficient documentation

## 2011-06-10 HISTORY — DX: Acute pancreatitis without necrosis or infection, unspecified: K85.90

## 2011-06-10 HISTORY — DX: Dorsalgia, unspecified: M54.9

## 2011-06-10 HISTORY — DX: Unspecified convulsions: R56.9

## 2011-06-10 LAB — DIFFERENTIAL
Basophils Relative: 0 % (ref 0–1)
Eosinophils Absolute: 0.2 10*3/uL (ref 0.0–0.7)
Eosinophils Relative: 3 % (ref 0–5)
Lymphocytes Relative: 31 % (ref 12–46)
Monocytes Relative: 13 % — ABNORMAL HIGH (ref 3–12)
Neutro Abs: 4.4 10*3/uL (ref 1.7–7.7)
Neutrophils Relative %: 53 % (ref 43–77)

## 2011-06-10 LAB — LACTIC ACID, PLASMA: Lactic Acid, Venous: 1.2 mmol/L (ref 0.5–2.2)

## 2011-06-10 LAB — CBC
HCT: 43.1 % (ref 39.0–52.0)
Hemoglobin: 14.9 g/dL (ref 13.0–17.0)
MCHC: 34.6 g/dL (ref 30.0–36.0)
RBC: 3.88 MIL/uL — ABNORMAL LOW (ref 4.22–5.81)
WBC: 8.2 10*3/uL (ref 4.0–10.5)

## 2011-06-10 LAB — BASIC METABOLIC PANEL
BUN: 4 mg/dL — ABNORMAL LOW (ref 6–23)
Chloride: 99 mEq/L (ref 96–112)
Glucose, Bld: 104 mg/dL — ABNORMAL HIGH (ref 70–99)
Potassium: 3.8 mEq/L (ref 3.5–5.1)
Sodium: 138 mEq/L (ref 135–145)

## 2011-06-10 LAB — HEPATIC FUNCTION PANEL
AST: 29 U/L (ref 0–37)
Albumin: 4 g/dL (ref 3.5–5.2)
Alkaline Phosphatase: 108 U/L (ref 39–117)
Total Protein: 7.6 g/dL (ref 6.0–8.3)

## 2011-06-10 MED ORDER — IOHEXOL 300 MG/ML  SOLN
100.0000 mL | Freq: Once | INTRAMUSCULAR | Status: AC | PRN
  Administered 2011-06-10: 100 mL via INTRAVENOUS

## 2011-06-10 MED ORDER — SODIUM CHLORIDE 0.9 % IV SOLN
Freq: Once | INTRAVENOUS | Status: AC
  Administered 2011-06-10: 10:00:00 via INTRAVENOUS

## 2011-06-10 MED ORDER — HYDROMORPHONE HCL 2 MG/ML IJ SOLN
2.0000 mg | Freq: Once | INTRAMUSCULAR | Status: AC
  Administered 2011-06-10: 2 mg via INTRAVENOUS
  Filled 2011-06-10: qty 1

## 2011-06-10 MED ORDER — HYDROMORPHONE HCL 1 MG/ML IJ SOLN
1.0000 mg | Freq: Once | INTRAMUSCULAR | Status: AC
  Administered 2011-06-10: 1 mg via INTRAVENOUS
  Filled 2011-06-10: qty 1

## 2011-06-10 MED ORDER — HYDROMORPHONE HCL 2 MG/ML IJ SOLN: 2.0000 mg | Freq: Once | INTRAMUSCULAR | Status: DC

## 2011-06-10 MED ORDER — OXYCODONE-ACETAMINOPHEN 5-325 MG PO TABS
2.0000 | ORAL_TABLET | Freq: Once | ORAL | Status: AC
  Administered 2011-06-10: 2 via ORAL
  Filled 2011-06-10: qty 2

## 2011-06-10 MED ORDER — OXYCODONE-ACETAMINOPHEN 5-325 MG PO TABS: 2.0000 | ORAL_TABLET | ORAL | Status: AC | PRN

## 2011-06-10 MED ORDER — ONDANSETRON HCL 4 MG/2ML IJ SOLN
4.0000 mg | Freq: Once | INTRAMUSCULAR | Status: AC
  Administered 2011-06-10: 4 mg via INTRAVENOUS
  Filled 2011-06-10: qty 2

## 2011-06-10 MED ORDER — SODIUM CHLORIDE 0.9 % IV BOLUS (SEPSIS)
1000.0000 mL | Freq: Once | INTRAVENOUS | Status: AC
  Administered 2011-06-10: 1000 mL via INTRAVENOUS

## 2011-06-10 MED ORDER — ONDANSETRON HCL 4 MG PO TABS: 4.0000 mg | ORAL_TABLET | Freq: Four times a day (QID) | ORAL | Status: AC

## 2011-06-10 NOTE — ED Notes (Signed)
edp aware pt asking for more pain medication.  EDP says not at this time.

## 2011-06-10 NOTE — ED Notes (Signed)
Pt reports severe abd pain that began last night.  Pt reports hx of pancreatitis and says "i know this is what it is".  Pt in obvious distress from pain.

## 2011-06-10 NOTE — ED Notes (Signed)
Pt up to restroom without difficultly.

## 2011-06-10 NOTE — ED Provider Notes (Signed)
Scribed for Glynn Octave, MD, the patient was seen in room [facility]. This chart was scribed by [employer]. The patient's care started at 08:25   History     CSN: [id number] Arrival date & time: 06/10/2011  8:16 AM  Chief Complaint  Patient presents with  . Pancreatitis   HPI  [name] is a 35 y.o. male with a history of pancreatitis due to alcohol abuse in the past, who presents to the Emergency Department complaining of diffuse abdominal pain. Patient states pain started last night and he couldn't sleep due to the pain. He reports a history of similar pain/symptoms with pancreatitis in the past requiring hospital admission. Pain is aggravated by movement. He denies any associated fever, nausea, vomiting, diarrhea, testicular pain or flank pain. Denies any recent alcohol use, states quit "a while ago", or any IV drug use. Denies any abdominal surgeries in the past. History of seizures currently on Tegretol and Keppra, and chronic back pain due to surgery currently on Tylox.  HPI ELEMENTS: Location: abdomen  Onset: last night Timing: constant Severity: 10/10  Modifying factors: Aggravated by movement Context: as above  Associated symptoms: as above  ALLERGIES:  Allergies as of 06/10/2011  . (No Known Allergies)       Past Medical History  Diagnosis Date  . Pancreatitis   . Seizures   . Back pain     Past Surgical History  Procedure Date  . Back surgery    MEDICATIONS:  Previous Medications   CARBAMAZEPINE (TEGRETOL) 200 MG TABLET    Take 200-400 mg by mouth 4 (four) times daily. Take 2 tablets every morning, 1 tablet at noon, 1 tablet every evening, and if needed may take 1 at bedtime.   LEVETIRACETAM (KEPPRA PO)    Take by mouth 2 (two) times daily.     LEVETIRACETAM (KEPPRA) 500 MG TABLET    Take 500 mg by mouth 2 (two) times daily.     OXYCODONE-ACETAMINOPHEN (TYLOX) 5-500 MG PER CAPSULE    Take 1-2 capsules by mouth every 6 (six) hours as needed.  For pain      ALLERGIES:  Allergies as of 06/10/2011  . (No Known Allergies)     FAMILY HISTORY:  No Pertinent Family History  History  Substance Use Topics  . Smoking status: Current Everyday Smoker  . Smokeless tobacco: Not on file  . Alcohol Use: No      Review of Systems  Constitutional: Negative for fever.  HENT: Negative for facial swelling.   Eyes: Negative for visual disturbance.  Respiratory: Negative for shortness of breath.   Gastrointestinal: Negative for nausea, vomiting and diarrhea.  Genitourinary: Negative for flank pain.  Musculoskeletal: Positive for back pain (chronic).  Skin: Negative for pallor and rash.  Neurological: Negative for weakness and headaches.  All other systems reviewed and are negative.    Physical Exam   ED Triage Vitals  Enc Vitals Group     BP 06/10/11 0812 138/114 mmHg     Pulse Rate 06/10/11 0812 107      Resp 06/10/11 0812 22      Temp 06/10/11 0812 98.2 F (36.8 C)     Temp src 06/10/11 0812 Oral     SpO2 06/10/11 0812 98 %     Weight 06/10/11 0812 120 lb (54.432 kg)     Height 06/10/11 0812 5\' 8"  (1.727 m)     Head Cir --      Peak Flow --  Pain Score 06/10/11 0812 Ten     Pain Loc --      Pain Edu? --      Excl. in GC? --      Physical Exam  Constitutional: He is oriented to person, place, and time. He appears well-developed and well-nourished.  HENT:  Head: Normocephalic and atraumatic.  Mouth/Throat: Oropharynx is clear and moist.  Eyes: Conjunctivae and EOM are normal. Pupils are equal, round, and reactive to light. No scleral icterus.  Neck: Normal range of motion. Neck supple.  Cardiovascular: Normal rate, regular rhythm and normal heart sounds.   Pulmonary/Chest: Effort normal and breath sounds normal. No respiratory distress. He has no wheezes.  Abdominal: Soft. Bowel sounds are normal. There is tenderness (diffuse but mailnly epigastric and midline).  Genitourinary: Testes normal. Right testis  shows no tenderness. Left testis shows no tenderness.       Testicles non tender   Musculoskeletal: Normal range of motion.  Neurological: He is alert and oriented to person, place, and time. He has normal strength and normal reflexes. No cranial nerve deficit or sensory deficit. Coordination normal.  Skin: Skin is warm and dry.  Psychiatric: He has a normal mood and affect. His behavior is normal.    ED Course  Procedures  OTHER DATA REVIEWED: Nursing notes, vital signs, and past medical records reviewed.    DIAGNOSTIC STUDIES: Oxygen Saturation is 98% on room air, normal by my interpretation.     LABS / RADIOLOGY:  Results for orders placed during the hospital encounter of 06/10/11  CBC      Component Value Range   WBC 8.2  4.0 - 10.5 (K/uL)   RBC 3.88 (*) 4.22 - 5.81 (MIL/uL)   Hemoglobin 14.9  13.0 - 17.0 (g/dL)   HCT 16.1  09.6 - 04.5 (%)   MCV 111.1 (*) 78.0 - 100.0 (fL)   MCH 38.4 (*) 26.0 - 34.0 (pg)   MCHC 34.6  30.0 - 36.0 (g/dL)   RDW 40.9  81.1 - 91.4 (%)   Platelets 236  150 - 400 (K/uL)  DIFFERENTIAL      Component Value Range   Neutrophils Relative 53  43 - 77 (%)   Lymphocytes Relative 31  12 - 46 (%)   Monocytes Relative 13 (*) 3 - 12 (%)   Eosinophils Relative 3  0 - 5 (%)   Basophils Relative 0  0 - 1 (%)   Neutro Abs 4.4  1.7 - 7.7 (K/uL)   Lymphs Abs 2.5  0.7 - 4.0 (K/uL)   Monocytes Absolute 1.1 (*) 0.1 - 1.0 (K/uL)   Eosinophils Absolute 0.2  0.0 - 0.7 (K/uL)   Basophils Absolute 0.0  0.0 - 0.1 (K/uL)   RBC Morphology OVAL MACROCYTES    BASIC METABOLIC PANEL      Component Value Range   Sodium 138  135 - 145 (mEq/L)   Potassium 3.8  3.5 - 5.1 (mEq/L)   Chloride 99  96 - 112 (mEq/L)   CO2 29  19 - 32 (mEq/L)   Glucose, Bld 104 (*) 70 - 99 (mg/dL)   BUN 4 (*) 6 - 23 (mg/dL)   Creatinine, Ser <7.82 (*) 0.50 - 1.35 (mg/dL)   Calcium 8.9  8.4 - 95.6 (mg/dL)   GFR calc non Af Amer NOT CALCULATED  >60 (mL/min)   GFR calc Af Amer NOT CALCULATED   >60 (mL/min)  LIPASE, BLOOD      Component Value Range   Lipase 206 (*)  11 - 59 (U/L)  HEPATIC FUNCTION PANEL      Component Value Range   Total Protein 7.6  6.0 - 8.3 (g/dL)   Albumin 4.0  3.5 - 5.2 (g/dL)   AST 29  0 - 37 (U/L)   ALT 11  0 - 53 (U/L)   Alkaline Phosphatase 108  39 - 117 (U/L)   Total Bilirubin 0.3  0.3 - 1.2 (mg/dL)   Bilirubin, Direct 0.1  0.0 - 0.3 (mg/dL)   Indirect Bilirubin 0.2 (*) 0.3 - 0.9 (mg/dL)  LACTIC ACID, PLASMA      Component Value Range   Lactic Acid, Venous 1.2  0.5 - 2.2 (mmol/L)   Ct Abdomen Pelvis W Contrast Interpreted by Dr Charline Bills. Impression: Findings compatible with mild acute pancreatitis, without drainable fluid collection or evidence of pancreatic necrosis. The appearance is markedly improved compared to 04/19/2011.   ED COURSE / COORDINATION OF CARE:  08:30 EDMD examined patient and ordered  CBC, Differential, BMP, Lipase, blood, Hepatic function panel and lactic acid plasma. 09:30 Am - Re-examined by ED physician, patient reports pain still persists. ED physician discussed results with the patient and family as well as recommendation for admission and further evaluation/treatment. 10:35 - EDMD quick recheck on patient who said he was gradually improving.    MDM: abdominal pain, similar to previous pancreatitis.    Mild elevation of lipase.  CT with acute pancreatitis changes but much improved from previous. D/w patient option of admission.  States he would rather go home. [place]anson 0-1 (LDH not checked) I feel this is reasonable as he is not vomiting and has stable vitals.  He is encouraged to return with worsening pain, uncontrollable vomiting, fever, or any other concerning signs or symptoms.    IMPRESSION: Diagnoses that have been ruled out:  Diagnoses that are still under consideration:  Final diagnoses:  Acute pancreatitis  Abdominal pain, epigastric    PLAN:  Home Advised to return for worsening or additional  problems such as abdominal or chest pain The patient is to return the emergency department if there is any worsening of symptoms. I have reviewed the discharge instructions with the patient.   CONDITION ON DISCHARGE: Stable   MEDICATIONS GIVEN IN THE E.D.  Medications  carbamazepine (TEGRETOL) 200 MG tablet (not administered)  oxyCODONE-acetaminophen (TYLOX) 5-500 MG per capsule (not administered)  LevETIRAcetam (KEPPRA PO) (not administered)  levETIRAcetam (KEPPRA) 500 MG tablet (not administered)  0.9 %  sodium chloride infusion (  Intravenous New Bag 06/10/11 0941)  sodium chloride 0.9 % bolus 1,000 mL (1000 mL Intravenous Given 06/10/11 0845)  HYDROmorphone (DILAUDID) injection 1 mg (1 mg Intravenous Given 06/10/11 0840)  ondansetron (ZOFRAN) injection 4 mg (4 mg Intravenous Given 06/10/11 0839)  HYDROmorphone (DILAUDID) injection 2 mg (2 mg Intravenous Given 06/10/11 0937)  iohexol (OMNIPAQUE) 300 MG/ML injection 100 mL (100 mL Intravenous Contrast Given 06/10/11 1142)  oxyCODONE-acetaminophen (PERCOCET) 5-325 MG per tablet 2 tablet (2 tablet Oral Given 06/10/11 1218)     DISCHARGE MEDICATIONS: New Prescriptions   ONDANSETRON (ZOFRAN) 4 MG TABLET    Take 1 tablet (4 mg total) by mouth every 6 (six) hours.   OXYCODONE-ACETAMINOPHEN (PERCOCET) 5-325 MG PER TABLET    Take 2 tablets by mouth every 4 (four) hours as needed for pain.   I personally performed the services described in this documentation, which was scribed in my presence.  The recorded information has been reviewed and considered.   I personally performed the services  described in this documentation, which was scribed in my presence.  The recorded information has been reviewed and considered.       Glynn Octave, MD 06/10/11 1520

## 2011-06-10 NOTE — ED Notes (Signed)
MD at bedside. Dr Manus Gunning at bedside talking to pt.

## 2011-07-06 ENCOUNTER — Encounter (HOSPITAL_COMMUNITY): Payer: Self-pay | Admitting: *Deleted

## 2011-07-06 ENCOUNTER — Emergency Department (HOSPITAL_COMMUNITY): Payer: Medicaid Other

## 2011-07-06 ENCOUNTER — Inpatient Hospital Stay (HOSPITAL_COMMUNITY)
Admission: EM | Admit: 2011-07-06 | Discharge: 2011-07-08 | DRG: 439 | Discharge status: Against Medical Advice | Payer: Medicaid Other | Attending: Internal Medicine | Admitting: Internal Medicine

## 2011-07-06 DIAGNOSIS — Z720 Tobacco use: Secondary | ICD-10-CM | POA: Diagnosis present

## 2011-07-06 DIAGNOSIS — J441 Chronic obstructive pulmonary disease with (acute) exacerbation: Secondary | ICD-10-CM | POA: Diagnosis present

## 2011-07-06 DIAGNOSIS — F17200 Nicotine dependence, unspecified, uncomplicated: Secondary | ICD-10-CM | POA: Diagnosis present

## 2011-07-06 DIAGNOSIS — J449 Chronic obstructive pulmonary disease, unspecified: Secondary | ICD-10-CM | POA: Diagnosis present

## 2011-07-06 DIAGNOSIS — K852 Alcohol induced acute pancreatitis without necrosis or infection: Secondary | ICD-10-CM | POA: Diagnosis present

## 2011-07-06 DIAGNOSIS — F10939 Alcohol use, unspecified with withdrawal, unspecified: Secondary | ICD-10-CM | POA: Diagnosis present

## 2011-07-06 DIAGNOSIS — N390 Urinary tract infection, site not specified: Secondary | ICD-10-CM | POA: Diagnosis present

## 2011-07-06 DIAGNOSIS — F1010 Alcohol abuse, uncomplicated: Secondary | ICD-10-CM | POA: Diagnosis present

## 2011-07-06 DIAGNOSIS — D7589 Other specified diseases of blood and blood-forming organs: Secondary | ICD-10-CM | POA: Diagnosis present

## 2011-07-06 DIAGNOSIS — K560 Paralytic ileus: Secondary | ICD-10-CM | POA: Diagnosis present

## 2011-07-06 DIAGNOSIS — F10239 Alcohol dependence with withdrawal, unspecified: Secondary | ICD-10-CM | POA: Diagnosis present

## 2011-07-06 DIAGNOSIS — R030 Elevated blood-pressure reading, without diagnosis of hypertension: Secondary | ICD-10-CM | POA: Diagnosis not present

## 2011-07-06 DIAGNOSIS — R062 Wheezing: Secondary | ICD-10-CM | POA: Diagnosis present

## 2011-07-06 DIAGNOSIS — Z5329 Procedure and treatment not carried out because of patient's decision for other reasons: Secondary | ICD-10-CM

## 2011-07-06 DIAGNOSIS — K859 Acute pancreatitis without necrosis or infection, unspecified: Principal | ICD-10-CM | POA: Diagnosis present

## 2011-07-06 DIAGNOSIS — G40909 Epilepsy, unspecified, not intractable, without status epilepticus: Secondary | ICD-10-CM | POA: Diagnosis present

## 2011-07-06 DIAGNOSIS — IMO0001 Reserved for inherently not codable concepts without codable children: Secondary | ICD-10-CM

## 2011-07-06 HISTORY — DX: Chronic obstructive pulmonary disease, unspecified: J44.9

## 2011-07-06 HISTORY — DX: Alcohol abuse, uncomplicated: F10.10

## 2011-07-06 HISTORY — DX: Nontraumatic subarachnoid hemorrhage, unspecified: I60.9

## 2011-07-06 LAB — COMPREHENSIVE METABOLIC PANEL
ALT: 12 U/L (ref 0–53)
AST: 29 U/L (ref 0–37)
Albumin: 4.3 g/dL (ref 3.5–5.2)
Alkaline Phosphatase: 108 U/L (ref 39–117)
Glucose, Bld: 91 mg/dL (ref 70–99)
Potassium: 3.6 mEq/L (ref 3.5–5.1)
Sodium: 137 mEq/L (ref 135–145)
Total Protein: 7.7 g/dL (ref 6.0–8.3)

## 2011-07-06 LAB — CBC
MCH: 39 pg — ABNORMAL HIGH (ref 26.0–34.0)
Platelets: 237 10*3/uL (ref 150–400)
RBC: 3.95 MIL/uL — ABNORMAL LOW (ref 4.22–5.81)

## 2011-07-06 LAB — DIFFERENTIAL
Basophils Relative: 0 % (ref 0–1)
Eosinophils Absolute: 0.2 10*3/uL (ref 0.0–0.7)
Lymphs Abs: 2.8 10*3/uL (ref 0.7–4.0)
Neutrophils Relative %: 72 % (ref 43–77)

## 2011-07-06 MED ORDER — THIAMINE HCL 100 MG/ML IJ SOLN
100.0000 mg | Freq: Every day | INTRAMUSCULAR | Status: DC
  Administered 2011-07-06 – 2011-07-08 (×3): 100 mg via INTRAVENOUS
  Filled 2011-07-06 (×3): qty 2

## 2011-07-06 MED ORDER — SODIUM CHLORIDE 0.9 % IV SOLN
INTRAVENOUS | Status: AC
  Administered 2011-07-06: 16:00:00 via INTRAVENOUS

## 2011-07-06 MED ORDER — SODIUM CHLORIDE 0.9 % IV SOLN
INTRAVENOUS | Status: DC
  Administered 2011-07-06: 08:00:00 via INTRAVENOUS

## 2011-07-06 MED ORDER — HYDROMORPHONE HCL 1 MG/ML IJ SOLN
1.0000 mg | Freq: Once | INTRAMUSCULAR | Status: AC
  Administered 2011-07-06: 1 mg via INTRAVENOUS
  Filled 2011-07-06: qty 1

## 2011-07-06 MED ORDER — HYDROMORPHONE HCL 1 MG/ML IJ SOLN
1.0000 mg | INTRAMUSCULAR | Status: AC | PRN
  Administered 2011-07-06: 1 mg via INTRAVENOUS
  Administered 2011-07-07 (×2): 2 mg via INTRAVENOUS
  Filled 2011-07-06 (×2): qty 2
  Filled 2011-07-06: qty 1

## 2011-07-06 MED ORDER — FOLIC ACID 5 MG/ML IJ SOLN
1.0000 mg | Freq: Every day | INTRAMUSCULAR | Status: DC
  Administered 2011-07-06 – 2011-07-08 (×3): 1 mg via INTRAVENOUS
  Filled 2011-07-06 (×4): qty 0.2

## 2011-07-06 MED ORDER — ALBUTEROL SULFATE (5 MG/ML) 0.5% IN NEBU
2.5000 mg | INHALATION_SOLUTION | Freq: Four times a day (QID) | RESPIRATORY_TRACT | Status: DC
  Administered 2011-07-06 – 2011-07-08 (×6): 2.5 mg via RESPIRATORY_TRACT
  Filled 2011-07-06 (×6): qty 0.5

## 2011-07-06 MED ORDER — LEVETIRACETAM 500 MG PO TABS
500.0000 mg | ORAL_TABLET | Freq: Two times a day (BID) | ORAL | Status: DC
  Administered 2011-07-06 – 2011-07-08 (×4): 500 mg via ORAL
  Filled 2011-07-06 (×4): qty 1

## 2011-07-06 MED ORDER — FAMOTIDINE IN NACL 20-0.9 MG/50ML-% IV SOLN
20.0000 mg | Freq: Once | INTRAVENOUS | Status: AC
  Administered 2011-07-06: 20 mg via INTRAVENOUS
  Filled 2011-07-06: qty 50

## 2011-07-06 MED ORDER — LORAZEPAM 1 MG PO TABS: 1.0000 mg | ORAL_TABLET | Freq: Four times a day (QID) | ORAL | Status: DC | PRN

## 2011-07-06 MED ORDER — KCL IN DEXTROSE-NACL 20-5-0.9 MEQ/L-%-% IV SOLN
INTRAVENOUS | Status: DC
  Administered 2011-07-06 – 2011-07-08 (×6): via INTRAVENOUS

## 2011-07-06 MED ORDER — NICOTINE 21 MG/24HR TD PT24
21.0000 mg | MEDICATED_PATCH | Freq: Every day | TRANSDERMAL | Status: DC
  Administered 2011-07-06 – 2011-07-08 (×3): 21 mg via TRANSDERMAL
  Filled 2011-07-06 (×3): qty 1

## 2011-07-06 MED ORDER — ONDANSETRON HCL 4 MG/2ML IJ SOLN: 4.0000 mg | Freq: Four times a day (QID) | INTRAMUSCULAR | Status: DC | PRN

## 2011-07-06 MED ORDER — LORAZEPAM 2 MG/ML IJ SOLN: 0.0000 mg | Freq: Two times a day (BID) | INTRAMUSCULAR | Status: DC

## 2011-07-06 MED ORDER — LORAZEPAM 2 MG/ML IJ SOLN
1.0000 mg | Freq: Once | INTRAMUSCULAR | Status: AC
  Administered 2011-07-06: 08:00:00 via INTRAVENOUS
  Filled 2011-07-06: qty 1

## 2011-07-06 MED ORDER — LORAZEPAM 2 MG/ML IJ SOLN
0.0000 mg | Freq: Four times a day (QID) | INTRAMUSCULAR | Status: DC
  Administered 2011-07-06: 2 mg via INTRAVENOUS
  Administered 2011-07-06: 1 mg via INTRAVENOUS
  Administered 2011-07-07 (×2): 2 mg via INTRAVENOUS
  Administered 2011-07-08: 1 mg via INTRAVENOUS
  Filled 2011-07-06 (×5): qty 1

## 2011-07-06 MED ORDER — ONDANSETRON HCL 4 MG/2ML IJ SOLN
4.0000 mg | INTRAMUSCULAR | Status: DC | PRN
  Administered 2011-07-06: 4 mg via INTRAVENOUS
  Filled 2011-07-06: qty 2

## 2011-07-06 MED ORDER — IPRATROPIUM BROMIDE 0.02 % IN SOLN
0.5000 mg | Freq: Four times a day (QID) | RESPIRATORY_TRACT | Status: DC
  Administered 2011-07-06 – 2011-07-08 (×6): 0.5 mg via RESPIRATORY_TRACT
  Filled 2011-07-06 (×6): qty 2.5

## 2011-07-06 MED ORDER — CARBAMAZEPINE 200 MG PO TABS
200.0000 mg | ORAL_TABLET | Freq: Four times a day (QID) | ORAL | Status: DC
  Administered 2011-07-06: 400 mg via ORAL
  Administered 2011-07-06: 200 mg via ORAL
  Filled 2011-07-06: qty 2
  Filled 2011-07-06: qty 1

## 2011-07-06 MED ORDER — LORAZEPAM 2 MG/ML IJ SOLN
1.0000 mg | Freq: Four times a day (QID) | INTRAMUSCULAR | Status: DC | PRN
  Administered 2011-07-07 – 2011-07-08 (×2): 1 mg via INTRAVENOUS
  Filled 2011-07-06 (×2): qty 1

## 2011-07-06 MED ORDER — HYDROMORPHONE HCL 1 MG/ML IJ SOLN
1.0000 mg | INTRAMUSCULAR | Status: DC | PRN
  Administered 2011-07-06: 1 mg via INTRAVENOUS
  Filled 2011-07-06: qty 1

## 2011-07-06 MED ORDER — FENTANYL CITRATE 0.05 MG/ML IJ SOLN
50.0000 ug | INTRAMUSCULAR | Status: AC | PRN
  Administered 2011-07-06 (×2): 100 ug via INTRAVENOUS
  Filled 2011-07-06 (×2): qty 2

## 2011-07-06 MED ORDER — ONDANSETRON HCL 4 MG/2ML IJ SOLN: 4.0000 mg | Freq: Three times a day (TID) | INTRAMUSCULAR | Status: DC | PRN

## 2011-07-06 NOTE — ED Notes (Signed)
C/o onset of severe upper abd pain last night; describes as sharp, rates 10/10; hx of pancreatitis and ETOH abuse; states drinks daily, last drink yesterday; denies n/v

## 2011-07-06 NOTE — ED Notes (Signed)
Pt c/o pain to umbilical region. Pt holding stomach and groaning. Pt breathing rapidly. Pt states pain increases with palpation movement and laying down.

## 2011-07-06 NOTE — ED Provider Notes (Signed)
Scribed for Jose Anger, DO, the patient was seen in room APA06/APA06 . This chart was scribed by Ellie Lunch. This patient's care was started at 7:57 AM.   CSN: [id number] Arrival date & time: 07/06/2011  7:40 AM  Chief Complaint  Patient presents with  . Abdominal Pain    upper   HPI Pt was seen at 7:58. [name] is a 35 y.o. male with a history of pancreatitis and ETOH abuse presents to the Emergency Department complaining of gradual onset and worsening of persistent upper abdominal pain starting last night. Pain began after he was drinking alcohol.  Pt describes the pain as "sharp."  Pt denies drinking any alcohol today. Denies N/V/D, denies black or bloody stools. Denies CP/SOB, no fevers, no back pain.  There are no other associated symptoms and no other alleviating or aggravating factors. Pt was last seen in ED 06/10/2011 for similar symptoms.    Past Medical History  Diagnosis Date  . Pancreatitis   . Seizures   . Back pain   . ETOH abuse   . COPD (chronic obstructive pulmonary disease)   . SAH (subarachnoid hemorrhage)     Past Surgical History  Procedure Date  . Back surgery   . Septoplasty    MEDICATIONS:  Previous Medications   CARBAMAZEPINE (TEGRETOL) 200 MG TABLET    Take 200-400 mg by mouth 4 (four) times daily. Take 2 tablets every morning, 1 tablet at noon, 1 tablet every evening, and if needed may take 1 at bedtime.   LEVETIRACETAM (KEPPRA PO)    Take by mouth 2 (two) times daily.     LEVETIRACETAM (KEPPRA) 500 MG TABLET    Take 500 mg by mouth 2 (two) times daily.     OXYCODONE-ACETAMINOPHEN (TYLOX) 5-500 MG PER CAPSULE    Take 1-2 capsules by mouth every 6 (six) hours as needed. For pain      ALLERGIES:  Allergies as of 07/06/2011  . (No Known Allergies)     History  Substance Use Topics  . Smoking status: Current Everyday Smoker -- 1.0 packs/day    Types: Cigarettes  . Smokeless tobacco: Not on file  . Alcohol Use: Yes     daily     Review of Systems ROS: Statement: All systems negative except as marked or noted in the HPI; Constitutional: Negative for fever and chills. ; ; Eyes: Negative for eye pain, redness and discharge. ; ; ENMT: Negative for ear pain, hoarseness, nasal congestion, sinus pressure and sore throat. ; ; Cardiovascular: Negative for chest pain, palpitations, diaphoresis, dyspnea and peripheral edema. ; ; Respiratory: Negative for cough, wheezing and stridor. ; ; Gastrointestinal: +abd pain.  Negative for nausea, vomiting, diarrhea, blood in stool, hematemesis, jaundice and rectal bleeding. . ; ; Genitourinary: Negative for dysuria, flank pain and hematuria. ; ; Musculoskeletal: Negative for back pain and neck pain. Negative for swelling and trauma.; ; Skin: Negative for pruritus, rash, abrasions, blisters, bruising and skin lesion.; ; Neuro: Negative for headache, lightheadedness and neck stiffness. Negative for weakness, altered level of consciousness , altered mental status, extremity weakness, paresthesias, involuntary movement, seizure and syncope.     Physical Exam  BP 157/94  Pulse 83  Temp(Src) 97.7 F (36.5 C) (Oral)  Resp 26  Ht 5\' 7"  (1.702 m)  Wt 120 lb (54.432 kg)  BMI 18.79 kg/m2  SpO2 97%   Physical Exam 0805: Physical examination:  Nursing notes reviewed; Vital signs and O2 SAT reviewed;  Constitutional:  Well developed, Well nourished, Uncomfortable; Head:  Normocephalic, atraumatic; Eyes: EOMI, PERRL, No scleral icterus; ENMT: Mouth and pharynx normal, Mucous membranes dry; Neck: Supple, Full range of motion, No lymphadenopathy; Cardiovascular: Regular rate and rhythm, No murmur, rub, or gallop; Respiratory: Breath sounds clear & equal bilaterally, No rales, rhonchi, wheezes, or rub, Normal respiratory effort/excursion; Chest: Nontender, Movement normal; Abdomen: Soft, +tender RUQ, mid-epigastric, LUQ areas to palp.  No rebound.  Nondistended, Normal bowel sounds; Extremities: Pulses  normal, No tenderness, No edema, No calf edema or asymmetry.; Neuro: AA&Ox3, Major CN grossly intact.  Speech clear, no facial droop.  No gross focal motor or sensory deficits in extremities. +mildly tremorous.; Skin: Color normal, Warm, Dry.   ED Course  Procedures  MDM MDM Reviewed: nursing note, vitals and previous chart Reviewed previous: labs Interpretation: labs and x-ray   06/10/11 CT A/P: "IMPRESSION:   Findings compatible with mild acute pancreatitis, without drainable fluid collection or evidence of pancreatic necrosis.  The   appearance is markedly improved compared to 04/19/2011.   Original Report Authenticated By: Charline Bills, M.D."  Results for orders placed during the hospital encounter of 07/06/11  ETHANOL      Component Value Range   Alcohol, Ethyl (B) <11  0 - 11 (mg/dL)  CBC      Component Value Range   WBC 14.5 (*) 4.0 - 10.5 (K/uL)   RBC 3.95 (*) 4.22 - 5.81 (MIL/uL)   Hemoglobin 15.4  13.0 - 17.0 (g/dL)   HCT 16.1  09.6 - 04.5 (%)   MCV 113.7 (*) 78.0 - 100.0 (fL)   MCH 39.0 (*) 26.0 - 34.0 (pg)   MCHC 34.3  30.0 - 36.0 (g/dL)   RDW 40.9  81.1 - 91.4 (%)   Platelets 237  150 - 400 (K/uL)  DIFFERENTIAL      Component Value Range   Neutrophils Relative 72  43 - 77 (%)   Neutro Abs 10.4 (*) 1.7 - 7.7 (K/uL)   Lymphocytes Relative 20  12 - 46 (%)   Lymphs Abs 2.8  0.7 - 4.0 (K/uL)   Monocytes Relative 7  3 - 12 (%)   Monocytes Absolute 1.1 (*) 0.1 - 1.0 (K/uL)   Eosinophils Relative 1  0 - 5 (%)   Eosinophils Absolute 0.2  0.0 - 0.7 (K/uL)   Basophils Relative 0  0 - 1 (%)   Basophils Absolute 0.0  0.0 - 0.1 (K/uL)  COMPREHENSIVE METABOLIC PANEL      Component Value Range   Sodium 137  135 - 145 (mEq/L)   Potassium 3.6  3.5 - 5.1 (mEq/L)   Chloride 99  96 - 112 (mEq/L)   CO2 27  19 - 32 (mEq/L)   Glucose, Bld 91  70 - 99 (mg/dL)   BUN 4 (*) 6 - 23 (mg/dL)   Creatinine, Ser <7.82 (*) 0.50 - 1.35 (mg/dL)   Calcium 9.3  8.4 - 95.6 (mg/dL)    Total Protein 7.7  6.0 - 8.3 (g/dL)   Albumin 4.3  3.5 - 5.2 (g/dL)   AST 29  0 - 37 (U/L)   ALT 12  0 - 53 (U/L)   Alkaline Phosphatase 108  39 - 117 (U/L)   Total Bilirubin 0.6  0.3 - 1.2 (mg/dL)   GFR calc non Af Amer NOT CALCULATED  >60 (mL/min)   GFR calc Af Amer NOT CALCULATED  >60 (mL/min)    Dg Abd Acute W/chest  07/06/2011  *RADIOLOGY REPORT*  Clinical  Data: Severe abdominal pain.  ACUTE ABDOMEN SERIES (ABDOMEN 2 VIEW & CHEST 1 VIEW)  Comparison: CT scan 06/10/2011.  Findings: The upright chest x-ray demonstrates no acute cardiopulmonary findings.  The cardiac silhouette, mediastinal and hilar contours are within normal limits.  Two views of the abdomen demonstrate air in the small bowel and colon with mild distention.  Findings suggest an ileus or gastroenteritis.  No free air.  The soft tissue shadows are maintained.  The bony structures are intact.  IMPRESSION:  Ileus appearing bowel gas pattern.  No findings for small bowel obstruction or free air.  Original Report Authenticated By: P. Loralie Champagne, M.D.    Results for AYDDEN, CUMPIAN (MRN [id number]) as of 07/06/2011 10:45  Ref. Range 04/22/2011 08:32 06/10/2011 08:41 07/06/2011 10:16  Lipase Latest Range: 11-59 U/L 107 (H) 206 (H) 441    11:19 AM:  Will hold repeat CT A/P for now, as pt has 3 CT A/P's this year already (last CT last month) without findings of pancreatic pseudocyst or necrosis.  Dx testing d/w pt.  Questions answered.  Verb understanding.  States he continues to be in pain, agreeable to admit (had refused admit last month for same).  T/C to [facility] Lendell Caprice, case discussed, including:  HPI, pertinent PM/SHx, VS/PE, dx testing, ED course and treatment.  Agreeable to admit.  Requests to write temporary orders, medical bed.  [facility] M    I personally performed the services described in this documentation, which was scribed in my presence. The recorded information has been reviewed and considered.  Oluwaseun Bruyere Allison Quarry, DO 07/07/11 1501

## 2011-07-07 DIAGNOSIS — J449 Chronic obstructive pulmonary disease, unspecified: Secondary | ICD-10-CM | POA: Diagnosis present

## 2011-07-07 DIAGNOSIS — F1010 Alcohol abuse, uncomplicated: Secondary | ICD-10-CM | POA: Diagnosis present

## 2011-07-07 DIAGNOSIS — K560 Paralytic ileus: Secondary | ICD-10-CM | POA: Diagnosis present

## 2011-07-07 DIAGNOSIS — K852 Alcohol induced acute pancreatitis without necrosis or infection: Secondary | ICD-10-CM | POA: Diagnosis present

## 2011-07-07 DIAGNOSIS — G40909 Epilepsy, unspecified, not intractable, without status epilepticus: Secondary | ICD-10-CM | POA: Diagnosis present

## 2011-07-07 DIAGNOSIS — R062 Wheezing: Secondary | ICD-10-CM | POA: Diagnosis present

## 2011-07-07 DIAGNOSIS — Z720 Tobacco use: Secondary | ICD-10-CM | POA: Diagnosis present

## 2011-07-07 LAB — URINALYSIS, ROUTINE W REFLEX MICROSCOPIC
Bilirubin Urine: NEGATIVE
Glucose, UA: >1000 mg/dL — AB
Hgb urine dipstick: NEGATIVE
Ketones, ur: NEGATIVE mg/dL
Protein, ur: NEGATIVE mg/dL
pH: 6 (ref 5.0–8.0)

## 2011-07-07 MED ORDER — HYDROMORPHONE HCL 1 MG/ML IJ SOLN
1.0000 mg | INTRAMUSCULAR | Status: DC | PRN
  Administered 2011-07-07 – 2011-07-08 (×4): 1 mg via INTRAVENOUS
  Administered 2011-07-08: 1.5 mg via INTRAVENOUS
  Administered 2011-07-08: 1 mg via INTRAVENOUS
  Filled 2011-07-07 (×2): qty 1
  Filled 2011-07-07: qty 2
  Filled 2011-07-07 (×3): qty 1

## 2011-07-07 MED ORDER — CLONIDINE HCL 0.1 MG/24HR TD PTWK
0.1000 mg | MEDICATED_PATCH | TRANSDERMAL | Status: DC
  Administered 2011-07-07: 0.1 mg via TRANSDERMAL
  Filled 2011-07-07: qty 1

## 2011-07-07 MED ORDER — CLONIDINE HCL 0.1 MG PO TABS
0.1000 mg | ORAL_TABLET | ORAL | Status: DC | PRN
  Administered 2011-07-07: 0.1 mg via ORAL
  Filled 2011-07-07: qty 1

## 2011-07-07 MED ORDER — CARBAMAZEPINE 200 MG PO TABS
200.0000 mg | ORAL_TABLET | Freq: Three times a day (TID) | ORAL | Status: DC
  Administered 2011-07-07 – 2011-07-08 (×4): 200 mg via ORAL
  Filled 2011-07-07 (×4): qty 1

## 2011-07-07 MED ORDER — HYDROMORPHONE HCL 1 MG/ML IJ SOLN
1.0000 mg | INTRAMUSCULAR | Status: DC | PRN
  Administered 2011-07-07: 2 mg via INTRAVENOUS
  Administered 2011-07-07 (×2): 1 mg via INTRAVENOUS
  Filled 2011-07-07: qty 2
  Filled 2011-07-07 (×2): qty 1

## 2011-07-07 MED ORDER — CLONIDINE HCL 0.1 MG PO TABS
0.1000 mg | ORAL_TABLET | Freq: Once | ORAL | Status: AC
  Administered 2011-07-07: 0.1 mg via ORAL
  Filled 2011-07-07: qty 1

## 2011-07-07 MED ORDER — LORAZEPAM 2 MG/ML IJ SOLN
1.0000 mg | Freq: Once | INTRAMUSCULAR | Status: AC
  Administered 2011-07-07: 1 mg via INTRAVENOUS
  Filled 2011-07-07: qty 1

## 2011-07-07 MED ORDER — CARBAMAZEPINE 200 MG PO TABS
400.0000 mg | ORAL_TABLET | ORAL | Status: DC
  Administered 2011-07-07 – 2011-07-08 (×2): 400 mg via ORAL
  Filled 2011-07-07 (×2): qty 2

## 2011-07-07 NOTE — Progress Notes (Signed)
Subjective: C/o pain. Requesting something to drink.  No N/V. Open to discussing alcohol treatment programs with social work. Also complains of suprapubic pain. Denies dysuria.  Objective: Vital signs in last 24 hours: Filed Vitals:   07/07/11 0600 07/07/11 0701 07/07/11 0800 07/07/11 1000  BP: 149/93   179/102  Pulse:   84 81  Temp:    97.4 F (36.3 C)  TempSrc:    Oral  Resp:    18  Height:      Weight:      SpO2:  100%  98%   Weight change:   Intake/Output Summary (Last 24 hours) at 07/07/11 1328 Last data filed at 07/07/11 1000  Gross per 24 hour  Intake   1534 ml  Output   1850 ml  Net   -316 ml   General: Asleep arousable. Has the phone lying on his abdomen which is soft. Operative but seems a little bit disoriented. Does not know the date. Lungs coarse. Better air movement today. Cardiovascular regular rate rhythm without murmurs gallops rubs Abdomen soft nondistended tender in the epigastrium Extremities fine tremor no edema  Lab Results: Basic Metabolic Panel:  Lab 07/06/11 0865  NA 137  K 3.6  CL 99  CO2 27  GLUCOSE 91  BUN 4*  CREATININE <0.47*  CALCIUM 9.3  MG --  PHOS --   Liver Function Tests:  Lab 07/06/11 0804  AST 29  ALT 12  ALKPHOS 108  BILITOT 0.6  PROT 7.7  ALBUMIN 4.3    Lab 07/07/11 0534 07/06/11 0804  LIPASE 295* 441*  AMYLASE -- --   No results found for this basename: AMMONIA:2 in the last 168 hours CBC:  Lab 07/06/11 0804  WBC 14.5*  NEUTROABS 10.4*  HGB 15.4  HCT 44.9  MCV 113.7*  PLT 237   Cardiac Enzymes: No results found for this basename: CKTOTAL:3,CKMB:3,CKMBINDEX:3,TROPONINI:3 in the last 168 hours BNP: No results found for this basename: POCBNP:3 in the last 168 hours D-Dimer: No results found for this basename: DDIMER:2 in the last 168 hours CBG: No results found for this basename: GLUCAP:6 in the last 168 hours Hemoglobin A1C: No results found for this basename: HGBA1C in the last 168 hours Fasting  Lipid Panel: No results found for this basename: CHOL,HDL,LDLCALC,TRIG,CHOLHDL,LDLDIRECT in the last 784 hours Thyroid Function Tests: No results found for this basename: TSH,T4TOTAL,FREET4,T3FREE,THYROIDAB in the last 168 hours Anemia Panel: No results found for this basename: VITAMINB12,FOLATE,FERRITIN,TIBC,IRON,RETICCTPCT in the last 168 hours Alcohol Level:  Lab 07/06/11 0804  ETH <11    Micro Results: No results found for this or any previous visit (from the past 240 hour(s)). Studies/Results: Dg Abd Acute W/chest  07/06/2011  *RADIOLOGY REPORT*  Clinical Data: Severe abdominal pain.  ACUTE ABDOMEN SERIES (ABDOMEN 2 VIEW & CHEST 1 VIEW)  Comparison: CT scan 06/10/2011.  Findings: The upright chest x-ray demonstrates no acute cardiopulmonary findings.  The cardiac silhouette, mediastinal and hilar contours are within normal limits.  Two views of the abdomen demonstrate air in the small bowel and colon with mild distention.  Findings suggest an ileus or gastroenteritis.  No free air.  The soft tissue shadows are maintained.  The bony structures are intact.  IMPRESSION:  Ileus appearing bowel gas pattern.  No findings for small bowel obstruction or free air.  Original Report Authenticated By: P. Loralie Champagne, M.D.   Scheduled Meds:   . sodium chloride   Intravenous STAT  . albuterol  2.5 mg Nebulization Q6H  .  carbamazepine  200 mg Oral TID  . carbamazepine  400 mg Oral QAM  . cloNIDine  0.1 mg Transdermal Weekly  . cloNIDine  0.1 mg Oral Once  . folic acid  1 mg Intravenous Daily  . ipratropium  0.5 mg Nebulization Q6H  . levETIRAcetam  500 mg Oral BID  . LORazepam  0-4 mg Intravenous Q6H   Followed by  . LORazepam  0-4 mg Intravenous Q12H  . LORazepam  1 mg Intravenous Once  . nicotine  21 mg Transdermal Daily  . thiamine  100 mg Intravenous Daily  . DISCONTD: carbamazepine  200-400 mg Oral QID   Continuous Infusions:   . dextrose 5 % and 0.9 % NaCl with KCl 20 mEq/L 125  mL/hr at 07/07/11 1125  . DISCONTD: sodium chloride 125 mL/hr at 07/06/11 0826   PRN Meds:.HYDROmorphone, HYDROmorphone (DILAUDID) injection, LORazepam, LORazepam, ondansetron, DISCONTD:  HYDROmorphone (DILAUDID) injection, DISCONTD: HYDROmorphone, DISCONTD: ondansetron, DISCONTD: ondansetron (ZOFRAN) IV Assessment/Plan: Principal Problem:  *Acute alcoholic pancreatitis Active Problems:  Alcohol abuse, continuous  Adynamic ileus  Wheezing  COPD (chronic obstructive pulmonary disease)  Tobacco abuse  Seizure disorder  Patient's blood pressure is elevated and he seems a bit disoriented today. I'm concerned about early alcohol withdrawal. I will give a dose of Ativan and notified the nurse. Clonidine will help blood pressure and withdrawal symptoms. I will give a single oral dose now and place a patch. He can try sips of clears. Check BMET tomorrow. Social work consult for alcohol abuse.  Check urinalysis to evaluate for UTI (suprapubic pain)  LOS: 1 day   Pauline Pegues L 07/07/2011, 1:28 PM

## 2011-07-07 NOTE — H&P (Signed)
Hospital Admission Note Date: 07/06/11  Patient name: [name] Medical record number: [id number] Date of birth: Aug 21, 1976 Age: 35 y.o. Gender: male PCP: [name], MD  Attending physician: Christiane Ha  Chief Complaint: pancreatitis  History of Present Illness: [name] is an 36 y.o. male with a history of recurrent alcoholic pancreatitis and continued alcohol abuse. He presents with a several day history of worsening epigastric pain. He is nauseated but has not vomited much. He has no abdominal bloating. He has no diarrhea. He's had no bleeding. His last drink was 2 days ago. He drinks more than a case of beer daily. He denies any drug use. He's been hospitalized multiple times over the past several years for same. Multiple ER visits. He's had no fevers or chills. He's had no shortness of breath. He does have a chronic wheeze and uses his girl friend's inhalers. He has not eaten much in several days. However, he is asking for something to eat right now. He had a CAT scan a month ago which showed no complications of pancreatitis. He's had multiple CAT scans of the abdomen and pelvis related to his pancreatitis.   Past Medical History  Diagnosis Date  . Pancreatitis   . Seizures   . Back pain   . ETOH abuse   . COPD (chronic obstructive pulmonary disease)   . SAH (subarachnoid hemorrhage)    Meds: Prescriptions prior to admission  Medication Sig Dispense Refill  . carbamazepine (TEGRETOL) 200 MG tablet Take 200-400 mg by mouth 4 (four) times daily. Take 2 tablets every morning, 1 tablet at noon, 1 tablet every evening, and if needed may take 1 at bedtime.      . levETIRAcetam (KEPPRA) 500 MG tablet Take 500 mg by mouth 2 (two) times daily.       Marland Kitchen oxyCODONE-acetaminophen (TYLOX) 5-500 MG per capsule Take 1-2 capsules by mouth every 6 (six) hours as needed. For pain      . DISCONTD: LevETIRAcetam (KEPPRA PO) Take by mouth 2 (two) times daily.          Allergies: Review of patient's allergies indicates not on file. History   Social History  . Marital Status: Single    Spouse Name: N/A    Number of Children: N/A  . Years of Education: N/A   Occupational History  . Not on file.   Social History Main Topics  . Smoking status: Current Everyday Smoker -- 1.0 packs/day    Types: Cigarettes  . Smokeless tobacco: Not on file  . Alcohol Use: Yes     daily  . Drug Use: No  . Sexually Active: Not Currently   Other Topics Concern  . Not on file   Social History Narrative  . No narrative on file   No family history on file. Past Surgical History  Procedure Date  . Back surgery   . Septoplasty    Review of Systems: Systems reviewed. As above otherwise negative.  Physical Exam: Blood pressure 149/93, pulse 94, temperature 98.8 F (37.1 C), temperature source Oral, resp. rate 18, height 5\' 7"  (1.702 m), weight 54.432 kg (120 lb), SpO2 100.00%. BP 149/93  Pulse 94  Temp(Src) 98.8 F (37.1 C) (Oral)  Resp 18  Ht 5\' 7"  (1.702 m)  Wt 54.432 kg (120 lb)  BMI 18.79 kg/m2  SpO2 100%  General Appearance:    Alert, cooperative, thin. Mild distress. Unkempt.   Head:    Normocephalic, without obvious abnormality, atraumatic  Eyes:    PERRL, conjunctiva/corneas clear, EOM's intact, fundi    benign, both eyes          Nose:   Nares normal, septum midline, mucosa normal, no drainage    or sinus tenderness  Throat:   Lips, mucosa, and tongue normal; teeth and gums normal  Neck:   Supple, symmetrical, trachea midline, no adenopathy;       thyroid:  No enlargement/tenderness/nodules; no carotid   bruit or JVD  Back:     Symmetric, no curvature, ROM normal, no CVA tenderness  Lungs:     Clear to auscultation bilaterally, respirations unlabored  Chest wall:    No tenderness or deformity  Heart:    Regular rate and rhythm, S1 and S2 normal, no murmur, rub   or gallop  Abdomen:     scaphoid. Voluntary guarding. Epigastric tenderness  to palpation. Normal bowel sounds. Nondistended   Genitalia:   deferred   Rectal:   deferred   Extremities:   Extremities normal, atraumatic, no cyanosis or edema  Pulses:   2+ and symmetric all extremities  Skin:   Skin color, texture, turgor normal, no rashes or lesions  Lymph nodes:   Cervical, supraclavicular, and axillary nodes normal  Neurologic:   CNII-XII intact. Normal strength, sensation and reflexes      throughout. No tremor. No asterixis. Psychiatric: Calm and cooperative with normal affect        Lab results: Basic Metabolic Panel:  Basename 07/06/11 0804  NA 137  K 3.6  CL 99  CO2 27  GLUCOSE 91  BUN 4*  CREATININE <0.47*  CALCIUM 9.3  MG --  PHOS --   Liver Function Tests:  [facility] 07/06/11 0804  AST 29  ALT 12  ALKPHOS 108  BILITOT 0.6  PROT 7.7  ALBUMIN 4.3    Basename 07/07/11 0534 07/06/11 0804  LIPASE 295* 441*  AMYLASE -- --   No results found for this basename: AMMONIA:2 in the last 72 hours CBC:  Basename 07/06/11 0804  WBC 14.5*  NEUTROABS 10.4*  HGB 15.4  HCT 44.9  MCV 113.7*  PLT 237   Cardiac Enzymes: No results found for this basename: CKTOTAL:3,CKMB:3,CKMBINDEX:3,TROPONINI:3 in the last 72 hours BNP: No results found for this basename: POCBNP:3 in the last 72 hours D-Dimer: No results found for this basename: DDIMER:2 in the last 72 hours CBG: No results found for this basename: GLUCAP:6 in the last 72 hours Hemoglobin A1C: No results found for this basename: HGBA1C in the last 72 hours Fasting Lipid Panel: No results found for this basename: CHOL,HDL,LDLCALC,TRIG,CHOLHDL,LDLDIRECT in the last 72 hours Thyroid Function Tests: No results found for this basename: TSH,T4TOTAL,FREET4,T3FREE,THYROIDAB in the last 72 hours Anemia Panel: No results found for this basename: VITAMINB12,FOLATE,FERRITIN,TIBC,IRON,RETICCTPCT in the last 72 hours  Alcohol Level:  [facility] 07/06/11 0804  ETH <11   Imaging results:  Dg Abd  Acute W/chest  07/06/2011  *RADIOLOGY REPORT*  Clinical Data: Severe abdominal pain.  ACUTE ABDOMEN SERIES (ABDOMEN 2 VIEW & CHEST 1 VIEW)  Comparison: CT scan 06/10/2011.  Findings: The upright chest x-ray demonstrates no acute cardiopulmonary findings.  The cardiac silhouette, mediastinal and hilar contours are within normal limits.  Two views of the abdomen demonstrate air in the small bowel and colon with mild distention.  Findings suggest an ileus or gastroenteritis.  No free air.  The soft tissue shadows are maintained.  The bony structures are intact.  IMPRESSION:  Ileus appearing bowel gas pattern.  No findings for small bowel obstruction or free air.  Original Report Authenticated By: P. Loralie Champagne, M.D.    Assessment & Plan: Principal Problem:  *Acute alcoholic pancreatitis Active Problems:  Alcohol abuse, continuous  Adynamic ileus  Wheezing  COPD (chronic obstructive pulmonary disease)  Tobacco abuse  Seizure disorder  The patient will be admitted for bowel rest, pain control, IV fluids and supportive care. He has a history of alcohol withdrawal and will be on Ativan withdrawal protocol. He'll be on time and and folate. He has a macrocytosis from alcoholism. He will need a social work consult. He reports that his alcoholism is causing a lot of discord with his girlfriend and he may be receptive to treatment. He has an ileus related to the pancreatitis.  COPD: Patient is currently wheezing. He reports that he always wheezes. I will order bronchodilators.  Seizure disorder: Continue outpatient medications   Jachelle Fluty L 07/07/2011, 7:34 AM

## 2011-07-08 ENCOUNTER — Encounter (HOSPITAL_COMMUNITY): Payer: Self-pay | Admitting: *Deleted

## 2011-07-08 ENCOUNTER — Emergency Department (HOSPITAL_COMMUNITY)
Admission: EM | Admit: 2011-07-08 | Discharge: 2011-07-09 | Disposition: A | Discharge status: Home / Self Care | Payer: Medicaid Other | Attending: Emergency Medicine | Admitting: Emergency Medicine

## 2011-07-08 DIAGNOSIS — F1010 Alcohol abuse, uncomplicated: Secondary | ICD-10-CM | POA: Insufficient documentation

## 2011-07-08 DIAGNOSIS — J4489 Other specified chronic obstructive pulmonary disease: Secondary | ICD-10-CM | POA: Insufficient documentation

## 2011-07-08 DIAGNOSIS — R569 Unspecified convulsions: Secondary | ICD-10-CM | POA: Insufficient documentation

## 2011-07-08 DIAGNOSIS — J449 Chronic obstructive pulmonary disease, unspecified: Secondary | ICD-10-CM | POA: Insufficient documentation

## 2011-07-08 DIAGNOSIS — R1033 Periumbilical pain: Secondary | ICD-10-CM | POA: Insufficient documentation

## 2011-07-08 LAB — DIFFERENTIAL
Basophils Absolute: 0 10*3/uL (ref 0.0–0.1)
Eosinophils Relative: 3 % (ref 0–5)
Lymphocytes Relative: 21 % (ref 12–46)

## 2011-07-08 LAB — BASIC METABOLIC PANEL
BUN: <3 mg/dL — ABNORMAL LOW (ref 6–23)
CO2: 29 mEq/L (ref 19–32)
Chloride: 101 mEq/L (ref 96–112)
Creatinine, Ser: <0.47 mg/dL — ABNORMAL LOW (ref 0.50–1.35)
Glucose, Bld: 94 mg/dL (ref 70–99)
Potassium: 4.9 mEq/L (ref 3.5–5.1)

## 2011-07-08 LAB — COMPREHENSIVE METABOLIC PANEL
ALT: 10 U/L (ref 0–53)
AST: 24 U/L (ref 0–37)
CO2: 27 mEq/L (ref 19–32)
Calcium: 9.4 mg/dL (ref 8.4–10.5)
GFR calc non Af Amer: >60 mL/min (ref >60)
Sodium: 129 mEq/L — ABNORMAL LOW (ref 135–145)
Total Protein: 7.4 g/dL (ref 6.0–8.3)

## 2011-07-08 LAB — CBC
MCV: 114.3 fL — ABNORMAL HIGH (ref 78.0–100.0)
Platelets: 152 10*3/uL (ref 150–400)
RDW: 13.9 % (ref 11.5–15.5)
WBC: 10.1 10*3/uL (ref 4.0–10.5)

## 2011-07-08 LAB — LIPASE, BLOOD: Lipase: 127 U/L — ABNORMAL HIGH (ref 11–59)

## 2011-07-08 MED ORDER — SODIUM CHLORIDE 0.9 % IV BOLUS (SEPSIS)
500.0000 mL | Freq: Once | INTRAVENOUS | Status: AC
  Administered 2011-07-08: 500 mL via INTRAVENOUS

## 2011-07-08 MED ORDER — ONDANSETRON HCL 4 MG/2ML IJ SOLN
4.0000 mg | Freq: Once | INTRAMUSCULAR | Status: AC
  Administered 2011-07-08: 4 mg via INTRAVENOUS
  Filled 2011-07-08: qty 2

## 2011-07-08 MED ORDER — SENNOSIDES-DOCUSATE SODIUM 8.6-50 MG PO TABS
1.0000 | ORAL_TABLET | Freq: Two times a day (BID) | ORAL | Status: DC
  Administered 2011-07-08: 1 via ORAL
  Filled 2011-07-08: qty 1

## 2011-07-08 MED ORDER — SODIUM CHLORIDE 0.9 % IV SOLN: INTRAVENOUS | Status: DC

## 2011-07-08 MED ORDER — HYDROMORPHONE HCL 1 MG/ML IJ SOLN
1.0000 mg | INTRAMUSCULAR | Status: DC | PRN
  Administered 2011-07-08: 1 mg via INTRAVENOUS
  Filled 2011-07-08 (×2): qty 1

## 2011-07-08 MED ORDER — CEFTRIAXONE SODIUM 1 G IJ SOLR
1.0000 g | INTRAMUSCULAR | Status: DC
  Administered 2011-07-08: 1 g via INTRAVENOUS
  Filled 2011-07-08 (×2): qty 1

## 2011-07-08 MED ORDER — HYDROMORPHONE HCL 1 MG/ML IJ SOLN
1.0000 mg | Freq: Once | INTRAMUSCULAR | Status: AC
  Administered 2011-07-08: 1 mg via INTRAVENOUS
  Filled 2011-07-08: qty 1

## 2011-07-08 NOTE — Discharge Summary (Signed)
Physician Discharge Summary  [name] MRN: [id number] DOB/AGE: Sep 15, 1976 35 y.o.  PCP: [name], MD   Admit date: 07/06/2011 Discharge date: 07/08/2011  Discharge Diagnoses:  1. The patient left AGAINST MEDICAL ADVICE. 2. Acute alcoholic pancreatitis. 3. Adynamic ileus, resolved. 4. Alcohol abuse. 5. Minor alcohol withdrawal symptoms. 6. Tobacco abuse. 7. COPD with bronchitic exacerbation. 8. Chronic seizure disorder. 9. Elevated blood pressure. 10. Possible urinary tract infection. 11. Macrocytosis, secondary to alcohol abuse.  Discharge medications: None. However, the patient was advised to continue his chronic medications.  Discharge Condition: Improved and alert and oriented.  Disposition: Home or Self Care   Consults: None.   Significant Diagnostic Studies: Ct Abdomen Pelvis W Contrast  06/10/2011  *RADIOLOGY REPORT*  Clinical Data: Abdominal pain, pancreatitis, evaluate for pseudocyst  CT ABDOMEN AND PELVIS WITH CONTRAST  Technique:  Multidetector CT imaging of the abdomen and pelvis was performed following the standard protocol during bolus administration of intravenous contrast.  Contrast: 100 ml Omnipaque-300 IV  Comparison: 04/19/2011  Findings: Lung bases are essentially clear.  Liver, spleen, and adrenal glands are within normal limits.  Pancreas is notable for mild peripancreatic inflammatory changes involving the pancreatic head/uncinate process.  No associated drainable fluid collection.  No evidence of pancreatic necrosis.  Gallbladder is within normal limits.  No intrahepatic or extrahepatic ductal dilatation.  Kidneys are within normal limits.  No hydronephrosis.  No evidence of bowel obstruction.  Normal appendix.  Atherosclerotic calcifications of the abdominal aorta and branch vessels.  Trace pelvic fluid.  No suspicious abdominopelvic ascites.  Prostate is unremarkable.  Bladder is within normal limits.  Mild degenerative changes of the  visualized thoracolumbar spine.  IMPRESSION: Findings compatible with mild acute pancreatitis, without drainable fluid collection or evidence of pancreatic necrosis.  The appearance is markedly improved compared to 04/19/2011.  Original Report Authenticated By: Charline Bills, M.D.   Dg Abd Acute W/chest  07/06/2011  *RADIOLOGY REPORT*  Clinical Data: Severe abdominal pain.  ACUTE ABDOMEN SERIES (ABDOMEN 2 VIEW & CHEST 1 VIEW)  Comparison: CT scan 06/10/2011.  Findings: The upright chest x-ray demonstrates no acute cardiopulmonary findings.  The cardiac silhouette, mediastinal and hilar contours are within normal limits.  Two views of the abdomen demonstrate air in the small bowel and colon with mild distention.  Findings suggest an ileus or gastroenteritis.  No free air.  The soft tissue shadows are maintained.  The bony structures are intact.  IMPRESSION:  Ileus appearing bowel gas pattern.  No findings for small bowel obstruction or free air.  Original Report Authenticated By: P. Loralie Champagne, M.D.      Microbiology: No results found for this or any previous visit (from the past 240 hour(s)).   Labs: Results for orders placed during the hospital encounter of 07/06/11 (from the past 48 hour(s))  LIPASE, BLOOD     Status: Abnormal   Collection Time   07/07/11  5:34 AM      Component Value Range Comment   Lipase 295 (*) 11 - 59 (U/L)   URINALYSIS, ROUTINE W REFLEX MICROSCOPIC     Status: Abnormal   Collection Time   07/07/11  4:40 PM      Component Value Range Comment   Color, Urine YELLOW  YELLOW     Appearance CLEAR  CLEAR     Specific Gravity, Urine 1.025  1.005 - 1.030     pH 6.0  5.0 - 8.0     Glucose, UA >1000 (*)  NEGATIVE (mg/dL)    Hgb urine dipstick NEGATIVE  NEGATIVE     Bilirubin Urine NEGATIVE  NEGATIVE     Ketones, ur NEGATIVE  NEGATIVE (mg/dL)    Protein, ur NEGATIVE  NEGATIVE (mg/dL)    Urobilinogen, UA 1.0  0.0 - 1.0 (mg/dL)    Nitrite NEGATIVE  NEGATIVE      Leukocytes, UA NEGATIVE  NEGATIVE    URINE MICROSCOPIC-ADD ON     Status: Normal   Collection Time   07/07/11  4:40 PM      Component Value Range Comment   WBC, UA 3-6  <3 (WBC/hpf)   LIPASE, BLOOD     Status: Abnormal   Collection Time   07/08/11  5:11 AM      Component Value Range Comment   Lipase 127 (*) 11 - 59 (U/L)   BASIC METABOLIC PANEL     Status: Abnormal   Collection Time   07/08/11  5:17 AM      Component Value Range Comment   Sodium 135  135 - 145 (mEq/L)    Potassium 4.9  3.5 - 5.1 (mEq/L)    Chloride 101  96 - 112 (mEq/L)    CO2 29  19 - 32 (mEq/L)    Glucose, Bld 94  70 - 99 (mg/dL)    BUN <3 (*) 6 - 23 (mg/dL) REPEATED TO VERIFY   Creatinine, Ser <0.47 (*) 0.50 - 1.35 (mg/dL)    Calcium 8.9  8.4 - 10.5 (mg/dL)    GFR calc non Af Amer NOT CALCULATED  >60 (mL/min)    GFR calc Af Amer NOT CALCULATED  >60 (mL/min)      HPI : The patient is a 35 year old man with a past medical history significant for recurrent alcoholic pancreatitis, alcohol abuse, tobacco abuse, and seizure disorder. He presented to the emergency department on 07/06/2011 with a chief complaint of abdominal pain. In the emergency department, his acute abdominal series was suggestive of an ileus appearing bowel gas pattern. His alcohol level was less than 11. His lipase was 441. White blood cell count was 14.5. His MCV was 113.7. His liver transaminases were within normal limits. He was hemodynamically stable and afebrile. He was admitted for further evaluation and management.   HOSPITAL COURSE: The patient was started on bowel rest, analgesic therapy with as needed IV hydromorphone, IV fluids, and antiemetic therapy as needed. The Ativan withdrawal protocol was initiated. Intravenous Protonix was added. Both albuterol and Atrovent nebulizations were ordered for his wheezing. A nicotine was placed daily. He was advised to stop smoking. Tobacco cessation counseling was ordered. He was also advised to stop drinking  and to obtain help for alcohol cessation. The clinical social worker was consulted to assist the patient with outpatient services that would help with his sobriety. His anti-seizure medications Keppra and Tegretol were continued.  On hospital day #2, his blood pressure began to increase. A clonidine patch was placed. There was concern about early alcohol withdrawal syndrome. However, he appeared more sedated than he did tremulous per my exam on followup on hospital day #3. The dose of hydromorphone was decreased and a clear liquid diet was started. He was started empirically on Rocephin for suprapubic tenderness and mild pyuria, likely an underlying urinary tract infection. Although there was a suggestion of an adynamic ileus, he did have at least one bowel movement during the hospitalization. He had no vomiting at all.   The patient requested an increase in the dosing  of Dilaudid and advancing his diet today. I informed him that if the dose of Dilaudid was increased, then, I would not advance his diet. He was not happy with this decision. He wanted to leave. I advised him not to as I did not believe he was clinically improved enough for discharge today. He requested a prescription for Percocet prior to discharge and I told him that I would not be able to do it because he was leaving AGAINST MEDICAL ADVICE. I told him that I was concerned that he would immediately start drinking alcohol again and be  back in the emergency department later on tonight or in the morning. I advised him to stay in the hospital for another day or 2. He refused. He was alert and oriented x3 at the time of discharge. He was ambulating in his room without difficulty. His lipase improved to 127 prior to discharge.     Blood pressure 161/95, pulse 79, temperature 97.5 F (36.4 C), temperature source Oral, resp. rate 16, height 5\' 7"  (1.702 m), weight 54.432 kg (120 lb), SpO2  97.00%.         Signed: Netta Fodge 07/08/2011, 6:12 PM

## 2011-07-08 NOTE — ED Notes (Signed)
Pt asking security guards to walk him upstairs so he can see someone. Pt now walking out side to smoke.

## 2011-07-08 NOTE — ED Provider Notes (Signed)
History  Scribed for Dr. Adriana Simas, the patient was seen in H4. The chart was scribed by Gilman Schmidt. The patients care was started at 2210.  CSN: [id number] Arrival date & time: 07/08/2011  8:47 PM  Chief Complaint  Patient presents with  . Abdominal Pain   Patient is a 35 y.o. male presenting with abdominal pain.  Abdominal Pain The primary symptoms of the illness include abdominal pain.    [name] is a 35 y.o. male with a history of Pancreatitis and ETOH abuse who presents to the Emergency Department complaining of abdominal pain. Pt had been admitted to ED two days ago with similar symptoms and discharged AMA. Pt reports continued pain and states he wants to be readmitted to ED. Pt additionally reports drinking beer yesterday. There are no other associated symptoms and no other alleviating or aggravating factors.   PAST MEDICAL HISTORY:  Past Medical History  Diagnosis Date  . Pancreatitis   . Seizures   . Back pain   . ETOH abuse   . COPD (chronic obstructive pulmonary disease)   . SAH (subarachnoid hemorrhage)      PAST SURGICAL HISTORY:  Past Surgical History  Procedure Date  . Back surgery   . Septoplasty      MEDICATIONS:  Previous Medications   CARBAMAZEPINE (TEGRETOL) 200 MG TABLET    Take 200-400 mg by mouth 4 (four) times daily. Take 2 tablets every morning, 1 tablet at noon, 1 tablet every evening, and if needed may take 1 at bedtime.   LEVETIRACETAM (KEPPRA) 500 MG TABLET    Take 500 mg by mouth 2 (two) times daily.    OXYCODONE-ACETAMINOPHEN (TYLOX) 5-500 MG PER CAPSULE    Take 1-2 capsules by mouth every 6 (six) hours as needed. For pain     ALLERGIES:  Allergies as of 07/08/2011  . (No Known Allergies)     FAMILY HISTORY:  History reviewed. No pertinent family history.   SOCIAL HISTORY: History  Substance Use Topics  . Smoking status: Current Everyday Smoker -- 1.0 packs/day    Types: Cigarettes  . Smokeless tobacco: Not on file  . Alcohol Use:  Yes     daily     Review of Systems  Gastrointestinal: Positive for abdominal pain.  All other systems reviewed and are negative.    Physical Exam  BP 139/110  Pulse 117  Temp(Src) 97.5 F (36.4 C) (Oral)  Resp 20  Ht 5\' 7"  (1.702 m)  Wt 120 lb (54.432 kg)  BMI 18.79 kg/m2  SpO2 97%  Physical Exam  Nursing note and vitals reviewed. Constitutional: He is oriented to person, place, and time.       Gaunt  Emaciated   HENT:  Head: Normocephalic and atraumatic.  Eyes: Conjunctivae and EOM are normal. Pupils are equal, round, and reactive to light.  Neck: Normal range of motion. Neck supple.  Cardiovascular: Normal rate and regular rhythm.   Pulmonary/Chest: Effort normal and breath sounds normal.  Abdominal: Soft. Bowel sounds are normal. There is tenderness (minimal ) in the periumbilical area.  Musculoskeletal: Normal range of motion.  Neurological: He is alert and oriented to person, place, and time.  Skin: Skin is warm and dry.  Psychiatric: He has a normal mood and affect.   OTHER DATA REVIEWED: Nursing notes, vital signs, and past medical records reviewed.   DIAGNOSTIC STUDIES: Oxygen Saturation is 97% on room air, normal by my interpretation.    LABS :  Results for orders  placed during the hospital encounter of 07/08/11  CBC      Component Value Range   WBC 10.1  4.0 - 10.5 (K/uL)   RBC 3.49 (*) 4.22 - 5.81 (MIL/uL)   Hemoglobin 13.8  13.0 - 17.0 (g/dL)   HCT 16.1  09.6 - 04.5 (%)   MCV 114.3 (*) 78.0 - 100.0 (fL)   MCH 39.5 (*) 26.0 - 34.0 (pg)   MCHC 34.6  30.0 - 36.0 (g/dL)   RDW 40.9  81.1 - 91.4 (%)   Platelets 152  150 - 400 (K/uL)  DIFFERENTIAL      Component Value Range   Neutrophils Relative 67  43 - 77 (%)   Neutro Abs 6.8  1.7 - 7.7 (K/uL)   Lymphocytes Relative 21  12 - 46 (%)   Lymphs Abs 2.1  0.7 - 4.0 (K/uL)   Monocytes Relative 9  3 - 12 (%)   Monocytes Absolute 1.0  0.1 - 1.0 (K/uL)   Eosinophils Relative 3  0 - 5 (%)   Eosinophils  Absolute 0.3  0.0 - 0.7 (K/uL)   Basophils Relative 0  0 - 1 (%)   Basophils Absolute 0.0  0.0 - 0.1 (K/uL)  COMPREHENSIVE METABOLIC PANEL      Component Value Range   Sodium 129 (*) 135 - 145 (mEq/L)   Potassium 3.6  3.5 - 5.1 (mEq/L)   Chloride 92 (*) 96 - 112 (mEq/L)   CO2 27  19 - 32 (mEq/L)   Glucose, Bld 101 (*) 70 - 99 (mg/dL)   BUN 5 (*) 6 - 23 (mg/dL)   Creatinine, Ser 7.82 (*) 0.50 - 1.35 (mg/dL)   Calcium 9.4  8.4 - 95.6 (mg/dL)   Total Protein 7.4  6.0 - 8.3 (g/dL)   Albumin 3.9  3.5 - 5.2 (g/dL)   AST 24  0 - 37 (U/L)   ALT 10  0 - 53 (U/L)   Alkaline Phosphatase 140 (*) 39 - 117 (U/L)   Total Bilirubin 0.4  0.3 - 1.2 (mg/dL)   GFR calc non Af Amer >60  >60 (mL/min)   GFR calc Af Amer >60  >60 (mL/min)  LIPASE, BLOOD      Component Value Range   Lipase 86 (*) 11 - 59 (U/L)     MDM:  Repeat labs. Pain management. IV Fluids.    IMPRESSION: Diagnoses that have been ruled out:  Diagnoses that are still under consideration:  Final diagnoses:    PLAN: No acute abdomen. Will hydrate. Rx for pain. Can discharge home.    MEDICATIONS GIVEN IN THE E.D.  Medications  sodium chloride 0.9 % bolus 500 mL (500 mL Intravenous Given 07/08/11 2224)  0.9 %  sodium chloride infusion (not administered)  HYDROmorphone (DILAUDID) injection 1 mg (1 mg Intravenous Given 07/08/11 2225)  ondansetron (ZOFRAN) injection 4 mg (4 mg Intravenous Given 07/08/11 2225)    DISCHARGE MEDICATIONS: New Prescriptions   No medications on file    SCRIBE ATTESTATION: I personally performed the services described in this documentation, which was scribed in my presence. The recorded information has been reviewed and considered. Donnetta Hutching, MD  Procedures        Donnetta Hutching, MD 07/09/11 [phone]

## 2011-07-08 NOTE — Progress Notes (Signed)
Pt threatened to leave the hospital against medical advice Dr Sherrie Mustache was notified of this and came to the room to discuss the risk of leaving  Pt was offered an increase in diet.  Pt refused to stay. Pt is oriented x4 alert and aware of consequences of leaving  Pt signed himself out of the hospital at 1635.  IV was removed prior to his leaving

## 2011-07-08 NOTE — Progress Notes (Signed)
Subjective: He still has some abdominal pain in the epigastric region. No nausea vomiting or increase in pain with a clear liquid diet. Nursing reports that he had one bowel movement yesterday.  Objective: Vital signs in last 24 hours: Filed Vitals:   07/08/11 0400 07/08/11 0704 07/08/11 1000 07/08/11 1100  BP: 123/80  139/84   Pulse: 73  66 67  Temp: 97.7 F (36.5 C)  97.7 F (36.5 C)   TempSrc:   Oral   Resp: 18  18   Height:      Weight:      SpO2: 94% 95% 98%    Weight change:   Intake/Output Summary (Last 24 hours) at 07/08/11 1218 Last data filed at 07/08/11 0800  Gross per 24 hour  Intake   4268 ml  Output   2476 ml  Net   1792 ml   General: He is a little sedated and attempts to fall asleep while speaking to me. He was just given 1 mg of hydromorphone. Lungs coarse: A few crackles and wheezes bilaterally. Cardiovascular regular rate rhythm without murmurs gallops rubs Abdomen soft nondistended mildly tender in the epigastrium Extremities fine tremor no edema  Lab Results: Basic Metabolic Panel:  Lab 07/08/11 2130 07/06/11 0804  NA 135 137  K 4.9 3.6  CL 101 99  CO2 29 27  GLUCOSE 94 91  BUN <3* 4*  CREATININE <0.47* <0.47*  CALCIUM 8.9 9.3  MG -- --  PHOS -- --   Liver Function Tests:  Lab 07/06/11 0804  AST 29  ALT 12  ALKPHOS 108  BILITOT 0.6  PROT 7.7  ALBUMIN 4.3    Lab 07/08/11 0511 07/07/11 0534  LIPASE 127* 295*  AMYLASE -- --   No results found for this basename: AMMONIA:2 in the last 168 hours CBC:  Lab 07/06/11 0804  WBC 14.5*  NEUTROABS 10.4*  HGB 15.4  HCT 44.9  MCV 113.7*  PLT 237   Cardiac Enzymes: No results found for this basename: CKTOTAL:3,CKMB:3,CKMBINDEX:3,TROPONINI:3 in the last 168 hours BNP: No results found for this basename: POCBNP:3 in the last 168 hours D-Dimer: No results found for this basename: DDIMER:2 in the last 168 hours CBG: No results found for this basename: GLUCAP:6 in the last 168  hours Hemoglobin A1C: No results found for this basename: HGBA1C in the last 168 hours Fasting Lipid Panel: No results found for this basename: CHOL,HDL,LDLCALC,TRIG,CHOLHDL,LDLDIRECT in the last 865 hours Thyroid Function Tests: No results found for this basename: TSH,T4TOTAL,FREET4,T3FREE,THYROIDAB in the last 168 hours Anemia Panel: No results found for this basename: VITAMINB12,FOLATE,FERRITIN,TIBC,IRON,RETICCTPCT in the last 168 hours Alcohol Level:  Lab 07/06/11 0804  ETH <11    Micro Results: No results found for this or any previous visit (from the past 240 hour(s)). Studies/Results: No results found. Scheduled Meds:    . albuterol  2.5 mg Nebulization Q6H  . carbamazepine  200 mg Oral TID  . carbamazepine  400 mg Oral QAM  . cloNIDine  0.1 mg Transdermal Weekly  . cloNIDine  0.1 mg Oral Once  . folic acid  1 mg Intravenous Daily  . ipratropium  0.5 mg Nebulization Q6H  . levETIRAcetam  500 mg Oral BID  . LORazepam  0-4 mg Intravenous Q6H   Followed by  . LORazepam  0-4 mg Intravenous Q12H  . LORazepam  1 mg Intravenous Once  . nicotine  21 mg Transdermal Daily  . senna-docusate  1 tablet Oral BID  . thiamine  100 mg  Intravenous Daily   Continuous Infusions:    . dextrose 5 % and 0.9 % NaCl with KCl 20 mEq/L 75 mL/hr at 07/08/11 1113   PRN Meds:.cloNIDine, HYDROmorphone, LORazepam, LORazepam, ondansetron, DISCONTD: HYDROmorphone, DISCONTD: HYDROmorphone Assessment/Plan: Principal Problem:  *Acute alcoholic pancreatitis Active Problems:  Alcohol abuse, continuous  Tobacco abuse  Seizure disorder  Adynamic ileus  Wheezing  COPD (chronic obstructive pulmonary disease)  Acute alcoholic pancreatitis: His lipase is much better today, however, still elevated. He appears to be sedated from not only hydromorphone but also from the Ativan alcohol withdrawal protocol, and perhaps, his anti-seizure medications. I will increase the frequency of Dilaudid to every 4  hours. He is tolerating a clear liquid diet for now.  Elevated blood pressure. As Dr. Lendell Caprice noted yesterday, there was a concern that the patient may have been developing alcohol withdrawal syndrome. A clonidine patch was placed. His blood pressure is much better today. We'll monitor his blood pressure and discontinue the clonidine patch if or when his blood pressure consistently improved. There is no history of hypertension.   Alcohol abuse and tobacco abuse. The patient was advised against both. The clinical social worker has been consulted for assistance with alcohol cessation. Tobacco cessation counseling will be ordered. A nicotine patch has been placed. He is also receiving vitamin therapy.  Pyuria. The patient was noted to have some suprapubic discomfort. We will start Rocephin for a probable urinary tract infection. Check a urine culture.  Adynamic ileus, resolved.  COPD with bronchitic exacerbation. He is receiving albuterol and Atrovent nebulizations.  Seizure disorder. This is stable on Tegretol and Keppra. We'll check a carbamazepine level in the morning.     LOS: 2 days   Theresa Wedel 07/08/2011, 12:18 PM

## 2011-07-09 MED ORDER — ONDANSETRON HCL 4 MG PO TABS: 4.0000 mg | ORAL_TABLET | Freq: Four times a day (QID) | ORAL | Status: AC

## 2011-07-09 MED ORDER — HYDROCODONE-ACETAMINOPHEN 5-325 MG PO TABS: 1.0000 | ORAL_TABLET | ORAL | Status: AC | PRN

## 2011-07-20 LAB — BASIC METABOLIC PANEL
CO2: 21
Glucose, Bld: 82
Potassium: 4
Sodium: 129 — ABNORMAL LOW

## 2011-07-25 LAB — URINALYSIS, MICROSCOPIC ONLY
Glucose, UA: NEGATIVE
Hgb urine dipstick: NEGATIVE
Specific Gravity, Urine: 1.011

## 2011-08-14 ENCOUNTER — Emergency Department (HOSPITAL_COMMUNITY): Payer: Medicaid Other

## 2011-08-14 ENCOUNTER — Inpatient Hospital Stay (HOSPITAL_COMMUNITY)
Admission: EM | Admit: 2011-08-14 | Discharge: 2011-08-18 | DRG: 439 | Discharge status: Against Medical Advice | Payer: Medicaid Other | Attending: Internal Medicine | Admitting: Internal Medicine

## 2011-08-14 ENCOUNTER — Encounter (HOSPITAL_COMMUNITY): Payer: Self-pay | Admitting: Emergency Medicine

## 2011-08-14 DIAGNOSIS — D7589 Other specified diseases of blood and blood-forming organs: Secondary | ICD-10-CM | POA: Diagnosis present

## 2011-08-14 DIAGNOSIS — J449 Chronic obstructive pulmonary disease, unspecified: Secondary | ICD-10-CM | POA: Diagnosis present

## 2011-08-14 DIAGNOSIS — F10939 Alcohol use, unspecified with withdrawal, unspecified: Secondary | ICD-10-CM | POA: Diagnosis not present

## 2011-08-14 DIAGNOSIS — F17200 Nicotine dependence, unspecified, uncomplicated: Secondary | ICD-10-CM | POA: Diagnosis present

## 2011-08-14 DIAGNOSIS — F1010 Alcohol abuse, uncomplicated: Secondary | ICD-10-CM | POA: Diagnosis present

## 2011-08-14 DIAGNOSIS — R001 Bradycardia, unspecified: Secondary | ICD-10-CM | POA: Diagnosis present

## 2011-08-14 DIAGNOSIS — J4489 Other specified chronic obstructive pulmonary disease: Secondary | ICD-10-CM | POA: Diagnosis present

## 2011-08-14 DIAGNOSIS — F10239 Alcohol dependence with withdrawal, unspecified: Secondary | ICD-10-CM | POA: Diagnosis present

## 2011-08-14 DIAGNOSIS — Z720 Tobacco use: Secondary | ICD-10-CM | POA: Diagnosis present

## 2011-08-14 DIAGNOSIS — F1020 Alcohol dependence, uncomplicated: Secondary | ICD-10-CM | POA: Diagnosis present

## 2011-08-14 DIAGNOSIS — G40909 Epilepsy, unspecified, not intractable, without status epilepticus: Secondary | ICD-10-CM | POA: Diagnosis present

## 2011-08-14 DIAGNOSIS — R030 Elevated blood-pressure reading, without diagnosis of hypertension: Secondary | ICD-10-CM | POA: Diagnosis present

## 2011-08-14 DIAGNOSIS — K852 Alcohol induced acute pancreatitis without necrosis or infection: Secondary | ICD-10-CM | POA: Diagnosis present

## 2011-08-14 DIAGNOSIS — IMO0001 Reserved for inherently not codable concepts without codable children: Secondary | ICD-10-CM | POA: Diagnosis not present

## 2011-08-14 DIAGNOSIS — K859 Acute pancreatitis without necrosis or infection, unspecified: Principal | ICD-10-CM | POA: Diagnosis present

## 2011-08-14 DIAGNOSIS — F1210 Cannabis abuse, uncomplicated: Secondary | ICD-10-CM | POA: Diagnosis present

## 2011-08-14 HISTORY — DX: Other specified diseases of blood and blood-forming organs: D75.89

## 2011-08-14 HISTORY — DX: Bradycardia, unspecified: R00.1

## 2011-08-14 LAB — DIFFERENTIAL
Basophils Absolute: 0 10*3/uL (ref 0.0–0.1)
Basophils Relative: 0 % (ref 0–1)
Eosinophils Absolute: 0.1 10*3/uL (ref 0.0–0.7)
Eosinophils Relative: 1 % (ref 0–5)
Lymphs Abs: 2 10*3/uL (ref 0.7–4.0)

## 2011-08-14 LAB — CBC
MCV: 114.2 fL — ABNORMAL HIGH (ref 78.0–100.0)
Platelets: 249 10*3/uL (ref 150–400)
RDW: 13.5 % (ref 11.5–15.5)
WBC: 11.3 10*3/uL — ABNORMAL HIGH (ref 4.0–10.5)

## 2011-08-14 LAB — POCT HEMOGLOBIN-HEMACUE: Hemoglobin: 12.5 — ABNORMAL LOW

## 2011-08-14 LAB — URINALYSIS, ROUTINE W REFLEX MICROSCOPIC
Glucose, UA: NEGATIVE mg/dL
Leukocytes, UA: NEGATIVE
Urobilinogen, UA: 0.2 mg/dL (ref 0.0–1.0)

## 2011-08-14 LAB — APTT: aPTT: 29 seconds (ref 24–37)

## 2011-08-14 LAB — PROTIME-INR: INR: 0.96 (ref 0.00–1.49)

## 2011-08-14 LAB — COMPREHENSIVE METABOLIC PANEL
ALT: 13 U/L (ref 0–53)
Albumin: 4.2 g/dL (ref 3.5–5.2)
Calcium: 9.4 mg/dL (ref 8.4–10.5)
GFR calc Af Amer: >90 mL/min (ref >90)
Glucose, Bld: 100 mg/dL — ABNORMAL HIGH (ref 70–99)
Sodium: 133 mEq/L — ABNORMAL LOW (ref 135–145)
Total Protein: 7.8 g/dL (ref 6.0–8.3)

## 2011-08-14 LAB — LIPASE, BLOOD: Lipase: 606 U/L — ABNORMAL HIGH (ref 11–59)

## 2011-08-14 LAB — URINE MICROSCOPIC-ADD ON

## 2011-08-14 LAB — RAPID URINE DRUG SCREEN, HOSP PERFORMED: Tetrahydrocannabinol: POSITIVE — AB

## 2011-08-14 MED ORDER — HYDROMORPHONE HCL 1 MG/ML IJ SOLN
1.0000 mg | INTRAMUSCULAR | Status: AC | PRN
  Administered 2011-08-14 (×2): 1 mg via INTRAVENOUS
  Filled 2011-08-14 (×2): qty 1

## 2011-08-14 MED ORDER — ALBUTEROL SULFATE (5 MG/ML) 0.5% IN NEBU
2.5000 mg | INHALATION_SOLUTION | Freq: Four times a day (QID) | RESPIRATORY_TRACT | Status: DC
  Administered 2011-08-14: 2.5 mg via RESPIRATORY_TRACT
  Filled 2011-08-14: qty 0.5

## 2011-08-14 MED ORDER — FOLIC ACID 1 MG PO TABS
1.0000 mg | ORAL_TABLET | Freq: Every day | ORAL | Status: DC
  Administered 2011-08-14 – 2011-08-15 (×2): 1 mg via ORAL
  Filled 2011-08-14 (×2): qty 1

## 2011-08-14 MED ORDER — VITAMIN B-1 100 MG PO TABS
100.0000 mg | ORAL_TABLET | Freq: Every day | ORAL | Status: DC
  Administered 2011-08-14 – 2011-08-15 (×2): 100 mg via ORAL
  Filled 2011-08-14 (×2): qty 1

## 2011-08-14 MED ORDER — HYDROMORPHONE HCL 2 MG/ML IJ SOLN
2.0000 mg | INTRAMUSCULAR | Status: DC | PRN
  Filled 2011-08-14: qty 2

## 2011-08-14 MED ORDER — LEVETIRACETAM 500 MG PO TABS
500.0000 mg | ORAL_TABLET | Freq: Two times a day (BID) | ORAL | Status: DC
  Administered 2011-08-14 – 2011-08-17 (×7): 500 mg via ORAL
  Filled 2011-08-14 (×7): qty 1

## 2011-08-14 MED ORDER — CARBAMAZEPINE 200 MG PO TABS
400.0000 mg | ORAL_TABLET | Freq: Two times a day (BID) | ORAL | Status: DC
  Administered 2011-08-14 – 2011-08-17 (×7): 400 mg via ORAL
  Filled 2011-08-14 (×7): qty 2

## 2011-08-14 MED ORDER — FAMOTIDINE IN NACL 20-0.9 MG/50ML-% IV SOLN
20.0000 mg | Freq: Once | INTRAVENOUS | Status: AC
  Administered 2011-08-14: 20 mg via INTRAVENOUS
  Filled 2011-08-14: qty 50

## 2011-08-14 MED ORDER — SODIUM CHLORIDE 0.9 % IV SOLN
INTRAVENOUS | Status: DC
  Administered 2011-08-14 – 2011-08-18 (×8): via INTRAVENOUS

## 2011-08-14 MED ORDER — PANTOPRAZOLE SODIUM 40 MG IV SOLR
40.0000 mg | INTRAVENOUS | Status: DC
  Administered 2011-08-14 – 2011-08-15 (×2): 40 mg via INTRAVENOUS
  Filled 2011-08-14 (×2): qty 40

## 2011-08-14 MED ORDER — THERA M PLUS PO TABS
1.0000 | ORAL_TABLET | Freq: Every day | ORAL | Status: DC
  Administered 2011-08-14 – 2011-08-15 (×2): 1 via ORAL
  Filled 2011-08-14 (×2): qty 1

## 2011-08-14 MED ORDER — IPRATROPIUM BROMIDE 0.02 % IN SOLN
0.5000 mg | Freq: Four times a day (QID) | RESPIRATORY_TRACT | Status: DC
  Administered 2011-08-14: 0.5 mg via RESPIRATORY_TRACT
  Filled 2011-08-14: qty 2.5

## 2011-08-14 MED ORDER — ONDANSETRON HCL 4 MG/2ML IJ SOLN
4.0000 mg | INTRAMUSCULAR | Status: AC | PRN
  Administered 2011-08-14 (×2): 4 mg via INTRAVENOUS
  Filled 2011-08-14 (×2): qty 2

## 2011-08-14 MED ORDER — THIAMINE HCL 100 MG/ML IJ SOLN
100.0000 mg | Freq: Every day | INTRAMUSCULAR | Status: DC
  Administered 2011-08-16: 100 mg via INTRAVENOUS
  Filled 2011-08-14: qty 2

## 2011-08-14 MED ORDER — ENOXAPARIN SODIUM 40 MG/0.4ML ~~LOC~~ SOLN
40.0000 mg | Freq: Every day | SUBCUTANEOUS | Status: DC
  Administered 2011-08-14 – 2011-08-17 (×4): 40 mg via SUBCUTANEOUS
  Filled 2011-08-14 (×4): qty 0.4

## 2011-08-14 MED ORDER — FENTANYL CITRATE 0.05 MG/ML IJ SOLN
100.0000 ug | INTRAMUSCULAR | Status: AC | PRN
  Administered 2011-08-14 (×2): 100 ug via INTRAVENOUS
  Filled 2011-08-14 (×2): qty 2

## 2011-08-14 MED ORDER — NICOTINE 14 MG/24HR TD PT24
14.0000 mg | MEDICATED_PATCH | Freq: Every day | TRANSDERMAL | Status: DC
  Administered 2011-08-14 – 2011-08-17 (×4): 14 mg via TRANSDERMAL
  Filled 2011-08-14 (×4): qty 1

## 2011-08-14 MED ORDER — LORAZEPAM 2 MG/ML IJ SOLN
1.0000 mg | Freq: Four times a day (QID) | INTRAMUSCULAR | Status: AC | PRN
  Administered 2011-08-17: 1 mg via INTRAVENOUS
  Filled 2011-08-14 (×3): qty 1

## 2011-08-14 MED ORDER — HYDROMORPHONE HCL 1 MG/ML IJ SOLN
2.0000 mg | INTRAMUSCULAR | Status: DC | PRN
  Administered 2011-08-14 – 2011-08-18 (×24): 2 mg via INTRAVENOUS
  Filled 2011-08-14 (×4): qty 2
  Filled 2011-08-14: qty 1
  Filled 2011-08-14 (×9): qty 2
  Filled 2011-08-14: qty 1
  Filled 2011-08-14 (×13): qty 2

## 2011-08-14 MED ORDER — SODIUM CHLORIDE 0.9 % IJ SOLN
INTRAMUSCULAR | Status: AC
  Administered 2011-08-14: 19:00:00
  Filled 2011-08-14: qty 10

## 2011-08-14 MED ORDER — ONDANSETRON HCL 4 MG/2ML IJ SOLN
4.0000 mg | Freq: Four times a day (QID) | INTRAMUSCULAR | Status: DC | PRN
Start: 2011-08-14 — End: 2011-08-18

## 2011-08-14 MED ORDER — SODIUM CHLORIDE 0.9 % IV SOLN: INTRAVENOUS | Status: DC

## 2011-08-14 MED ORDER — HYDROMORPHONE HCL 1 MG/ML IJ SOLN
2.0000 mg | INTRAMUSCULAR | Status: DC | PRN
  Administered 2011-08-14: 2 mg via INTRAVENOUS

## 2011-08-14 MED ORDER — LORAZEPAM 1 MG PO TABS
1.0000 mg | ORAL_TABLET | Freq: Four times a day (QID) | ORAL | Status: AC | PRN
  Administered 2011-08-14 – 2011-08-15 (×3): 1 mg via ORAL
  Filled 2011-08-14 (×3): qty 1

## 2011-08-14 MED ORDER — CARBAMAZEPINE 200 MG PO TABS
200.0000 mg | ORAL_TABLET | Freq: Four times a day (QID) | ORAL | Status: DC
  Filled 2011-08-14: qty 1

## 2011-08-14 NOTE — ED Notes (Signed)
C/o again that the Fentanyl did not really help his abdominal pain.  Explained to him that the hospitalist would be coming to see him and would discuss that with him.

## 2011-08-14 NOTE — H&P (Signed)
PCP:   [name], MD   Chief Complaint:  Abdominal pain  HPI: This is a 35 year old male with a history of pancreatitis alcohol abuse who came to the hospital with abdominal pain. The abdominal pain started last night and has continued since then. Patient has a history of alcohol abuse and as per the patient his last drink was on Sunday. Patient says that he drank 2 beers on that day. Patient did have one episode of nausea and vomiting this morning though he denies any diarrhea. Patient has a previous history of pancreatitis. No chest pain, no shortness of breath.  Allergies:  No Known Allergies    Past Medical History  Diagnosis Date  . Pancreatitis   . Seizures   . Back pain   . ETOH abuse   . COPD (chronic obstructive pulmonary disease)   . SAH (subarachnoid hemorrhage)     Past Surgical History  Procedure Date  . Back surgery   . Septoplasty     Prior to Admission medications   Medication Sig Start Date End Date Taking? Authorizing Provider  carbamazepine (TEGRETOL) 200 MG tablet Take 200-400 mg by mouth 4 (four) times daily. Take 2 tablets every morning, 1 tablet at noon, 1 tablet every evening, and if needed may take 1 at bedtime.   Yes Historical Provider, MD  levETIRAcetam (KEPPRA) 500 MG tablet Take 500 mg by mouth 2 (two) times daily.    Yes Historical Provider, MD  oxyCODONE-acetaminophen (TYLOX) 5-500 MG per capsule Take 2 capsules by mouth every 6 (six) hours as needed. For pain   Yes Historical Provider, MD    Social History:  reports that he has been smoking Cigarettes.  He has been smoking about 1 pack per day. He does not have any smokeless tobacco history on file. He reports that he drinks alcohol. He reports that he does not use illicit drugs.  History reviewed. No pertinent family history.  Review of Systems:  Constitutional: Denies fever, chills, diaphoresis, appetite change and fatigue.  HEENT: Denies blurred vision, no passing out  spell. Respiratory:Has h/o  wheezing, takes albuterol inhaler as needed, Cardiovascular: Denies chest pain, palpitations and leg swelling.  Gastrointestinal: Denies  diarrhea, constipation, blood in stool and abdominal distention.  Genitourinary: Denies dysuria, urgency, frequency, hematuria, flank pain and difficulty urinating.  Musculoskeletal: Denies myalgias, back pain, joint swelling, arthralgias and gait problem.  Skin: Denies pallor, rash and wound.  Neurological: Denies dizziness, seizures, syncope, weakness, light-headedness, numbness and headaches.     Physical Exam: Blood pressure 156/69, pulse 73, temperature 97.6 F (36.4 C), temperature source Oral, resp. rate 20, height 5\' 8"  (1.727 m), weight 54.432 kg (120 lb), SpO2 96.00%.  Constitutional: Vital signs reviewed.  Patient is a well-developed and well-nourished ,  in acute distress and cooperative with exam. Alert and oriented x3.  Head: Normocephalic and atraumatic Mouth: no erythema or exudates, MMM Eyes: PERRL, EOMI, conjunctivae normal, No scleral icterus.  Neck: Supple, Trachea midline normal ROM, No JVD, mass, thyromegaly, or carotid bruit present.  Cardiovascular: RRR, S1 normal, S2 normal, no MRG, pulses symmetric and intact bilaterally Pulmonary/Chest: Bilateral wheezing. Abdominal: Soft, positive tenderness in epigastric region. GU: no CVA tenderness Musculoskeletal: No joint deformities, erythema, or stiffness, ROM full and no nontender Neurological: A&O x3, Strenght is normal and symmetric bilaterally, cranial nerve II-XII are grossly intact, no focal motor deficit, sensory intact to light touch bilaterally.  Skin: Warm, dry and intact. No rash, cyanosis, or clubbing.  Psychiatric: Normal mood  and affect. speech and behavior is normal. Judgment and thought content normal. Cognition and memory are normal.    Labs on Admission:  Results for orders placed during the hospital encounter of 08/14/11 (from the past  48 hour(s))  APTT     Status: Normal   Collection Time   08/14/11 10:12 AM      Component Value Range Comment   aPTT 29  24 - 37 (seconds)   PROTIME-INR     Status: Normal   Collection Time   08/14/11 10:12 AM      Component Value Range Comment   Prothrombin Time 13.0  11.6 - 15.2 (seconds)    INR 0.96  0.00 - 1.49    CBC     Status: Abnormal   Collection Time   08/14/11 10:12 AM      Component Value Range Comment   WBC 11.3 (*) 4.0 - 10.5 (K/uL)    RBC 3.93 (*) 4.22 - 5.81 (MIL/uL)    Hemoglobin 14.9  13.0 - 17.0 (g/dL)    HCT 78.2  95.6 - 21.3 (%)    MCV 114.2 (*) 78.0 - 100.0 (fL)    MCH 37.9 (*) 26.0 - 34.0 (pg)    MCHC 33.2  30.0 - 36.0 (g/dL)    RDW 08.6  57.8 - 46.9 (%)    Platelets 249  150 - 400 (K/uL)   DIFFERENTIAL     Status: Abnormal   Collection Time   08/14/11 10:12 AM      Component Value Range Comment   Neutrophils Relative 74  43 - 77 (%)    Neutro Abs 8.4 (*) 1.7 - 7.7 (K/uL)    Lymphocytes Relative 18  12 - 46 (%)    Lymphs Abs 2.0  0.7 - 4.0 (K/uL)    Monocytes Relative 7  3 - 12 (%)    Monocytes Absolute 0.8  0.1 - 1.0 (K/uL)    Eosinophils Relative 1  0 - 5 (%)    Eosinophils Absolute 0.1  0.0 - 0.7 (K/uL)    Basophils Relative 0  0 - 1 (%)    Basophils Absolute 0.0  0.0 - 0.1 (K/uL)   COMPREHENSIVE METABOLIC PANEL     Status: Abnormal   Collection Time   08/14/11 10:12 AM      Component Value Range Comment   Sodium 133 (*) 135 - 145 (mEq/L)    Potassium 4.0  3.5 - 5.1 (mEq/L)    Chloride 94 (*) 96 - 112 (mEq/L)    CO2 28  19 - 32 (mEq/L)    Glucose, Bld 100 (*) 70 - 99 (mg/dL)    BUN 5 (*) 6 - 23 (mg/dL)    Creatinine, Ser 6.29 (*) 0.50 - 1.35 (mg/dL)    Calcium 9.4  8.4 - 10.5 (mg/dL)    Total Protein 7.8  6.0 - 8.3 (g/dL)    Albumin 4.2  3.5 - 5.2 (g/dL)    AST 29  0 - 37 (U/L)    ALT 13  0 - 53 (U/L)    Alkaline Phosphatase 125 (*) 39 - 117 (U/L)    Total Bilirubin 0.7  0.3 - 1.2 (mg/dL)    GFR calc non Af Amer >90  >90 (mL/min)     GFR calc Af Amer >90  >90 (mL/min)   LIPASE, BLOOD     Status: Abnormal   Collection Time   08/14/11 10:12 AM      Component Value Range Comment  Lipase 606 (*) 11 - 59 (U/L)   ETHANOL     Status: Normal   Collection Time   08/14/11 10:12 AM      Component Value Range Comment   Alcohol, Ethyl (B) <11  0 - 11 (mg/dL)   URINALYSIS, ROUTINE W REFLEX MICROSCOPIC     Status: Abnormal   Collection Time   08/14/11 11:03 AM      Component Value Range Comment   Color, Urine YELLOW  YELLOW     Appearance CLEAR  CLEAR     Specific Gravity, Urine 1.030  1.005 - 1.030     pH 6.0  5.0 - 8.0     Glucose, UA NEGATIVE  NEGATIVE (mg/dL)    Hgb urine dipstick NEGATIVE  NEGATIVE     Bilirubin Urine NEGATIVE  NEGATIVE     Ketones, ur NEGATIVE  NEGATIVE (mg/dL)    Protein, ur TRACE (*) NEGATIVE (mg/dL)    Urobilinogen, UA 0.2  0.0 - 1.0 (mg/dL)    Nitrite NEGATIVE  NEGATIVE     Leukocytes, UA NEGATIVE  NEGATIVE    URINE MICROSCOPIC-ADD ON     Status: Normal   Collection Time   08/14/11 11:03 AM      Component Value Range Comment   RBC / HPF 0-2  <3 (RBC/hpf)     Radiological Exams on Admission: Abdomen/chest xray: IMPRESSION:  Bronchitic changes.  Prominent small bowel loop in the left mid abdomen, which could  reflect a regional ileus in a patient with pancreatitis.  No definite evidence bowel obstruction or perforation identified.    Assessment/Plan Active Problems:  Acute alcoholic pancreatitis: Patient will be kept npo, Will start iv fluids 150 ml/hr. Will check Lipase in am.  Alcohol abuse:  Will start CIWA protocol.  Seizure disorder: Will start him on keppra, carbamazepine.  COPD (chronic obstructive pulmonary disease) Duoneb nebulizer treatments.  Time Spent on Admission: 45 minutes  Deniz Hannan S 08/14/2011, 3:58 PM

## 2011-08-14 NOTE — ED Notes (Signed)
B/P 144/87  HR  72  Appears much more comfortable than upon arrival

## 2011-08-14 NOTE — ED Notes (Signed)
Pt. More settled--now able to sit still on carrier but, continues to c/o pain med not helping--states he is usually given 2 mg of Dilaudid instead of one---have requested a urine several times and he seems agitated with that request "Why does she want a urine--it is my stomach that is hurting?"--urinal given and will be consistent with requests for specimen.

## 2011-08-14 NOTE — ED Notes (Signed)
Taken to x-ray via carrier--Stable

## 2011-08-14 NOTE — ED Notes (Signed)
Taken to 2nd floor via Doctor, general practice

## 2011-08-14 NOTE — ED Notes (Signed)
B/P 152/91  HR  59  T 98.4  R 20  P.O. 97%--Awaiting transfer to floor and rates his abdominal pain a 10 on 1-10 scale.

## 2011-08-14 NOTE — ED Notes (Signed)
C/O epigastric region pain that began last night--unable to sit still, very nervous, nausea but no vomiting--LBM was yesterday--History for pancreatitis--Rates pain 10 on 1-10 scale

## 2011-08-14 NOTE — ED Notes (Signed)
Voided approx. 100 ml of tea colored urine--specimen taken to lab

## 2011-08-14 NOTE — ED Notes (Signed)
Report called to Lisa, RN.

## 2011-08-14 NOTE — ED Provider Notes (Signed)
History    Scribed for Laray Anger, DO, the patient was seen in room [facility]. This chart was scribed by Katha Cabal. This patient's care was started at 9:44 AM.   CSN: [id number] Arrival date & time: 08/14/2011  9:25 AM  Chief Complaint  Patient presents with  . Abdominal Pain     HPI Pt was seen at 9:55 AM. [name] is a 35 y.o. male who presents to the Emergency Department complaining of gradual onset and persistence of constant upper abdominal "pain"  that began last night.  Has been assoc with nausea.  Describes the pain as "like my pancreatitis."  Has recent hx of admission for same.  Denies recent etoh use.  Denies vomiting/diarrhea, no CP/SOB, no black or blood in stools, no fevers, no back pain.  No PMD  [name], MD Neurosurgery    Past Medical History  Diagnosis Date  . Pancreatitis   . Seizures   . Back pain   . ETOH abuse   . COPD (chronic obstructive pulmonary disease)   . SAH (subarachnoid hemorrhage)     Past Surgical History  Procedure Date  . Back surgery   . Septoplasty      History  Substance Use Topics  . Smoking status: Current Everyday Smoker -- 1.0 packs/day    Types: Cigarettes  . Smokeless tobacco: Not on file  . Alcohol Use: Yes     daily   Allergies    Review of patient's allergies indicates no known allergies.   Home Medications    Current Outpatient Rx  Name Route Sig Dispense Refill  . CARBAMAZEPINE 200 MG PO TABS Oral Take 200-400 mg by mouth 4 (four) times daily. Take 2 tablets every morning, 1 tablet at noon, 1 tablet every evening, and if needed may take 1 at bedtime.    Marland Kitchen LEVETIRACETAM 500 MG PO TABS Oral Take 500 mg by mouth 2 (two) times daily.     . OXYCODONE-ACETAMINOPHEN 5-500 MG PO CAPS Oral Take 1-2 capsules by mouth every 6 (six) hours as needed. For pain       Review of Systems ROS: Statement: All systems negative except as marked or noted in the HPI; Constitutional: Negative for fever and  chills. ; ; Eyes: Negative for eye pain, redness and discharge. ; ; ENMT: Negative for ear pain, hoarseness, nasal congestion, sinus pressure and sore throat. ; ; Cardiovascular: Negative for chest pain, palpitations, diaphoresis, dyspnea and peripheral edema. ; ; Respiratory: Negative for cough, wheezing and stridor. ; ; Gastrointestinal: Positive for nausea, and abdominal pain; negative for vomiting, diarrhea, blood in stool, hematemesis, jaundice and rectal bleeding. . ; ; Genitourinary: Negative for dysuria, flank pain and hematuria. ; ; Musculoskeletal: Negative for back pain and neck pain. Negative for swelling and trauma.; ; Skin: Negative for pruritus, rash, abrasions, blisters, bruising and skin lesion.; ; Neuro: Negative for headache, lightheadedness and neck stiffness. Negative for weakness, altered level of consciousness , altered mental status, extremity weakness, paresthesias, involuntary movement, seizure and syncope.     BP 160/100  Pulse 82  Temp(Src) 97.6 F (36.4 C) (Oral)  Resp 21  Ht 5\' 8"  (1.727 m)  Wt 120 lb (54.432 kg)  BMI 18.25 kg/m2  SpO2 99%  Physical Exam 1000: Physical examination:  Nursing notes reviewed; Vital signs and O2 SAT reviewed;  Constitutional: Well developed, Well nourished, Well hydrated, Uncomfortable appearing. Head:  Normocephalic, atraumatic; Eyes: EOMI, PERRL, No scleral icterus; ENMT: Mouth and pharynx normal,  Mucous membranes moist; Neck: Supple, Full range of motion, No lymphadenopathy; Cardiovascular: Regular rate and rhythm, No murmur, rub, or gallop; Respiratory: Breath sounds clear & equal bilaterally, No rales, rhonchi, wheezes, or rub, Normal respiratory effort/excursion; Chest: Nontender, Movement normal; Abdomen: Soft, +tender mid-epigastric area to palp, Nondistended, Normal bowel sounds; Extremities: Pulses normal, No tenderness, No edema, No calf edema or asymmetry. +small scabbed area right volar wrist covered with Band-Aid, no drainage,  no erythema, no fluctuance ; Neuro: AA&Ox3, Major CN grossly intact. No facial droop, speech clear.  No gross focal motor or sensory deficits in extremities.; Skin: Color normal, Warm, Dry.   ED Course  Procedures  1000:  Scab right volar wrist noted, pt states he "picked at a bug bite" for the past several days.  No fluctuance, drainage, or surrounding erythema at this time.  Will continue with abd workup.  Repetitively requesting "some dilaudid for my pain" and that he needs "2mg  at a time to make it get better."     MDM  MDM Reviewed: nursing note, vitals and previous chart Reviewed previous: labs Interpretation: labs and x-ray   Results for orders placed during the hospital encounter of 08/14/11  APTT      Component Value Range   aPTT 29  24 - 37 (seconds)  PROTIME-INR      Component Value Range   Prothrombin Time 13.0  11.6 - 15.2 (seconds)   INR 0.96  0.00 - 1.49   CBC      Component Value Range   WBC 11.3 (*) 4.0 - 10.5 (K/uL)   RBC 3.93 (*) 4.22 - 5.81 (MIL/uL)   Hemoglobin 14.9  13.0 - 17.0 (g/dL)   HCT 16.1  09.6 - 04.5 (%)   MCV 114.2 (*) 78.0 - 100.0 (fL)   MCH 37.9 (*) 26.0 - 34.0 (pg)   MCHC 33.2  30.0 - 36.0 (g/dL)   RDW 40.9  81.1 - 91.4 (%)   Platelets 249  150 - 400 (K/uL)  DIFFERENTIAL      Component Value Range   Neutrophils Relative 74  43 - 77 (%)   Neutro Abs 8.4 (*) 1.7 - 7.7 (K/uL)   Lymphocytes Relative 18  12 - 46 (%)   Lymphs Abs 2.0  0.7 - 4.0 (K/uL)   Monocytes Relative 7  3 - 12 (%)   Monocytes Absolute 0.8  0.1 - 1.0 (K/uL)   Eosinophils Relative 1  0 - 5 (%)   Eosinophils Absolute 0.1  0.0 - 0.7 (K/uL)   Basophils Relative 0  0 - 1 (%)   Basophils Absolute 0.0  0.0 - 0.1 (K/uL)  COMPREHENSIVE METABOLIC PANEL      Component Value Range   Sodium 133 (*) 135 - 145 (mEq/L)   Potassium 4.0  3.5 - 5.1 (mEq/L)   Chloride 94 (*) 96 - 112 (mEq/L)   CO2 28  19 - 32 (mEq/L)   Glucose, Bld 100 (*) 70 - 99 (mg/dL)   BUN 5 (*) 6 - 23 (mg/dL)    Creatinine, Ser 7.82 (*) 0.50 - 1.35 (mg/dL)   Calcium 9.4  8.4 - 95.6 (mg/dL)   Total Protein 7.8  6.0 - 8.3 (g/dL)   Albumin 4.2  3.5 - 5.2 (g/dL)   AST 29  0 - 37 (U/L)   ALT 13  0 - 53 (U/L)   Alkaline Phosphatase 125 (*) 39 - 117 (U/L)   Total Bilirubin 0.7  0.3 - 1.2 (mg/dL)   GFR  calc non Af Amer >90  >90 (mL/min)   GFR calc Af Amer >90  >90 (mL/min)  LIPASE, BLOOD      Component Value Range   Lipase 606 (*) 11 - 59 (U/L)  ETHANOL      Component Value Range   Alcohol, Ethyl (B) <11  0 - 11 (mg/dL)  URINALYSIS, ROUTINE W REFLEX MICROSCOPIC      Component Value Range   Color, Urine YELLOW  YELLOW    Appearance CLEAR  CLEAR    Specific Gravity, Urine 1.030  1.005 - 1.030    pH 6.0  5.0 - 8.0    Glucose, UA NEGATIVE  NEGATIVE (mg/dL)   Hgb urine dipstick NEGATIVE  NEGATIVE    Bilirubin Urine NEGATIVE  NEGATIVE    Ketones, ur NEGATIVE  NEGATIVE (mg/dL)   Protein, ur TRACE (*) NEGATIVE (mg/dL)   Urobilinogen, UA 0.2  0.0 - 1.0 (mg/dL)   Nitrite NEGATIVE  NEGATIVE    Leukocytes, UA NEGATIVE  NEGATIVE   URINE MICROSCOPIC-ADD ON      Component Value Range   RBC / HPF 0-2  <3 (RBC/hpf)   Dg Abd Acute W/chest  08/14/2011  *RADIOLOGY REPORT*  Clinical Data: Pancreatitis, mid to left abdominal pain  ACUTE ABDOMEN SERIES (ABDOMEN 2 VIEW & CHEST 1 VIEW)  Comparison: 07/06/2011  Findings: Normal heart size, mediastinal contours, and pulmonary vascularity. Probable bilateral nipple shadows. No pulmonary infiltrate or pleural effusion. Mild bronchitic changes. Prominent small bowel loop in left upper abdomen though gas is present within the colon. No bowel wall thickening or free intraperitoneal air. Bones questionably mildly demineralized. No definite urinary tract calcification.  IMPRESSION: Bronchitic changes. Prominent small bowel loop in the left mid abdomen, which could reflect a regional ileus in a patient with pancreatitis. No definite evidence bowel obstruction or perforation  identified.  Original Report Authenticated By: Lollie Marrow, M.D.    2:44 PM:  Pt requiring multiple doses of IV dilaudid and fentanyl for pain control.  Lipase elevated today.  Previous 2 CT A/P (03/2011 and 05/2011) without acute pancreatic necrosis or drainable fluid collection.  Will hold another CT A/P for now.  T/C to [facility] Dr. Sharl Ma, case discussed, including:  HPI, pertinent PM/SHx, VS/PE, dx testing, ED course and treatment.  Agreeable to admit.  Requests to write temporary orders, medical bed to team AP2.   [facility]  I personally performed the services described in this documentation, which was scribed in my presence. The recorded information has been reviewed and considered.    Laray Anger, DO 08/16/11 Herbie [place]

## 2011-08-14 NOTE — ED Notes (Signed)
Pt c/o upper abd pain with n since last night. Denies v. States he has h/s of pancreatitis. nad noted.

## 2011-08-14 NOTE — ED Notes (Signed)
Returned from x-ray via carrier and requesting more pain med.

## 2011-08-14 NOTE — ED Notes (Signed)
Hospitalist here to assess pt. And explain plan of care.

## 2011-08-14 NOTE — ED Notes (Addendum)
Also I noticed a band-aid on his left wrist--he advises this is an infected mosquito bite--when I asked to look at it--He stated "No, it is well now."  Later when Dr. Judie Petit. Came in room I mentioned it to her and she removed the band-aid revealed a thick scabbed over area

## 2011-08-14 NOTE — ED Notes (Signed)
C/o abdominal pain returning and feeling nauseated--Orders received

## 2011-08-14 NOTE — ED Notes (Signed)
Awaiting room assignment--pt states "I have to have something else for pain--I am dying and I cannot wait until I get in my room."

## 2011-08-15 LAB — URINE CULTURE

## 2011-08-15 LAB — URINE DRUGS OF ABUSE SCREEN W ALC, ROUTINE (REF LAB)
Barbiturate Quant, Ur: NEGATIVE
Benzodiazepines.: NEGATIVE
Creatinine,U: 106.6 mg/dL
Ethyl Alcohol: <10 mg/dL (ref <10)
Marijuana Metabolite: POSITIVE — AB
Methadone: NEGATIVE
Phencyclidine (PCP): NEGATIVE

## 2011-08-15 LAB — COMPREHENSIVE METABOLIC PANEL
ALT: 9 U/L (ref 0–53)
AST: 23 U/L (ref 0–37)
Calcium: 8.7 mg/dL (ref 8.4–10.5)
Creatinine, Ser: 0.44 mg/dL — ABNORMAL LOW (ref 0.50–1.35)
Sodium: 136 mEq/L (ref 135–145)
Total Protein: 6 g/dL (ref 6.0–8.3)

## 2011-08-15 LAB — LIPASE, BLOOD: Lipase: 201 U/L — ABNORMAL HIGH (ref 11–59)

## 2011-08-15 MED ORDER — ALBUTEROL SULFATE (5 MG/ML) 0.5% IN NEBU: 2.5000 mg | INHALATION_SOLUTION | Freq: Four times a day (QID) | RESPIRATORY_TRACT | Status: DC | PRN

## 2011-08-15 MED ORDER — ONDANSETRON HCL 4 MG/2ML IJ SOLN: 4.0000 mg | Freq: Three times a day (TID) | INTRAMUSCULAR | Status: DC | PRN

## 2011-08-15 MED ORDER — HYDROMORPHONE HCL 1 MG/ML IJ SOLN: 1.0000 mg | INTRAMUSCULAR | Status: DC | PRN

## 2011-08-15 MED ORDER — SODIUM CHLORIDE 0.9 % IJ SOLN
INTRAMUSCULAR | Status: AC
  Administered 2011-08-15: 10 mL
  Filled 2011-08-15: qty 10

## 2011-08-15 MED ORDER — IPRATROPIUM BROMIDE 0.02 % IN SOLN
0.5000 mg | Freq: Three times a day (TID) | RESPIRATORY_TRACT | Status: DC
Start: 2011-08-15 — End: 2011-08-18
  Administered 2011-08-15 – 2011-08-17 (×7): 0.5 mg via RESPIRATORY_TRACT
  Filled 2011-08-15 (×9): qty 2.5

## 2011-08-15 MED ORDER — LORAZEPAM 2 MG/ML IJ SOLN
1.0000 mg | Freq: Once | INTRAMUSCULAR | Status: AC
  Administered 2011-08-15: 1 mg via INTRAVENOUS
  Filled 2011-08-15: qty 1

## 2011-08-15 MED ORDER — CLONIDINE HCL 0.1 MG/24HR TD PTWK
0.1000 mg | MEDICATED_PATCH | TRANSDERMAL | Status: DC
  Administered 2011-08-15: 0.1 mg via TRANSDERMAL
  Filled 2011-08-15: qty 1

## 2011-08-15 MED ORDER — LORAZEPAM 2 MG/ML IJ SOLN
1.0000 mg | Freq: Three times a day (TID) | INTRAMUSCULAR | Status: DC
  Administered 2011-08-15 – 2011-08-16 (×2): 1 mg via INTRAVENOUS
  Filled 2011-08-15: qty 1

## 2011-08-15 MED ORDER — ALBUTEROL SULFATE (5 MG/ML) 0.5% IN NEBU
2.5000 mg | INHALATION_SOLUTION | Freq: Three times a day (TID) | RESPIRATORY_TRACT | Status: DC
  Administered 2011-08-15 – 2011-08-17 (×7): 2.5 mg via RESPIRATORY_TRACT
  Filled 2011-08-15 (×9): qty 0.5

## 2011-08-15 NOTE — Progress Notes (Signed)
Chart reviewed.  Subjective: Pain continues. No nausea.  Objective: Vital signs in last 24 hours: Filed Vitals:   08/15/11 0512 08/15/11 0740 08/15/11 1357 08/15/11 1455  BP: 145/79  171/98   Pulse: 94  59   Temp: 97.6 F (36.4 C)  97.5 F (36.4 C)   TempSrc: Oral  Axillary   Resp: 20  20   Height:      Weight: 55.248 kg (121 lb 12.8 oz)     SpO2: 95% 98% 99% 98%   Weight change:   Intake/Output Summary (Last 24 hours) at 08/15/11 1836 Last data filed at 08/15/11 0900  Gross per 24 hour  Intake      0 ml  Output    500 ml  Net   -500 ml   General: Patient appears somewhat anxious. He is appropriate and cooperative Lungs clear to auscultation bilaterally without wheezes rhonchi or rales Cardiovascular regular rate rhythm without murmurs gallops rubs Abdomen flat normal bowel sounds soft mild epigastric tenderness Extremities no clubbing cyanosis or edema Neurologic alert and oriented slightly tremulous Lab Results: Basic Metabolic Panel:  Lab 08/15/11 1610 08/14/11 1012  NA 136 133*  K 4.2 4.0  CL 101 94*  CO2 28 28  GLUCOSE 81 100*  BUN 4* 5*  CREATININE 0.44* 0.44*  CALCIUM 8.7 9.4  MG -- --  PHOS -- --   Liver Function Tests:  Lab 08/15/11 0501 08/14/11 1012  AST 23 29  ALT 9 13  ALKPHOS 101 125*  BILITOT 0.7 0.7  PROT 6.0 7.8  ALBUMIN 3.2* 4.2    Lab 08/15/11 0501 08/14/11 1012  LIPASE 201* 606*  AMYLASE -- --   No results found for this basename: AMMONIA:2 in the last 168 hours CBC:  Lab 08/14/11 1012  WBC 11.3*  NEUTROABS 8.4*  HGB 14.9  HCT 44.9  MCV 114.2*  PLT 249   Cardiac Enzymes: No results found for this basename: CKTOTAL:3,CKMB:3,CKMBINDEX:3,TROPONINI:3 in the last 168 hours BNP: No results found for this basename: POCBNP:3 in the last 168 hours D-Dimer: No results found for this basename: DDIMER:2 in the last 168 hours CBG: No results found for this basename: GLUCAP:6 in the last 168 hours Hemoglobin A1C: No results  found for this basename: HGBA1C in the last 168 hours Fasting Lipid Panel: No results found for this basename: CHOL,HDL,LDLCALC,TRIG,CHOLHDL,LDLDIRECT in the last 960 hours Thyroid Function Tests: No results found for this basename: TSH,T4TOTAL,FREET4,T3FREE,THYROIDAB in the last 168 hours Anemia Panel: No results found for this basename: VITAMINB12,FOLATE,FERRITIN,TIBC,IRON,RETICCTPCT in the last 168 hours   Micro Results: Recent Results (from the past 240 hour(s))  URINE CULTURE     Status: Normal   Collection Time   08/14/11 11:03 AM      Component Value Range Status Comment   Specimen Description URINE, CLEAN CATCH   Final    Special Requests NONE   Final    Setup Time [id number]   Final    Colony Count NO GROWTH   Final    Culture NO GROWTH   Final    Report Status 08/15/2011 FINAL   Final    Studies/Results: Dg Abd Acute W/chest  08/14/2011  *RADIOLOGY REPORT*  Clinical Data: Pancreatitis, mid to left abdominal pain  ACUTE ABDOMEN SERIES (ABDOMEN 2 VIEW & CHEST 1 VIEW)  Comparison: 07/06/2011  Findings: Normal heart size, mediastinal contours, and pulmonary vascularity. Probable bilateral nipple shadows. No pulmonary infiltrate or pleural effusion. Mild bronchitic changes. Prominent small bowel loop in left upper  abdomen though gas is present within the colon. No bowel wall thickening or free intraperitoneal air. Bones questionably mildly demineralized. No definite urinary tract calcification.  IMPRESSION: Bronchitic changes. Prominent small bowel loop in the left mid abdomen, which could reflect a regional ileus in a patient with pancreatitis. No definite evidence bowel obstruction or perforation identified.  Original Report Authenticated By: Lollie Marrow, M.D.   Scheduled Meds:   . albuterol  2.5 mg Nebulization TID  . carbamazepine  400 mg Oral BID  . enoxaparin  40 mg Subcutaneous Daily  . folic acid  1 mg Oral Daily  . ipratropium  0.5 mg Nebulization TID  .  levETIRAcetam  500 mg Oral BID  . multivitamins ther. w/minerals  1 tablet Oral Daily  . nicotine  14 mg Transdermal Daily  . pantoprazole (PROTONIX) IV  40 mg Intravenous Q24H  . sodium chloride      . vitamin B-1  100 mg Oral Daily   Or  . thiamine  100 mg Intravenous Daily  . DISCONTD: albuterol  2.5 mg Nebulization Q6H  . DISCONTD: carbamazepine  200-400 mg Oral QID  . DISCONTD: ipratropium  0.5 mg Nebulization Q6H   Continuous Infusions:   . sodium chloride 150 mL/hr at 08/15/11 1236   PRN Meds:.albuterol, HYDROmorphone (DILAUDID) injection, LORazepam, LORazepam, ondansetron, DISCONTD:  HYDROmorphone (DILAUDID) injection, DISCONTD:  HYDROmorphone (DILAUDID) injection Assessment/Plan: Active Problems:  Acute alcoholic pancreatitis  Alcohol abuse, continuous  COPD (chronic obstructive pulmonary disease)  Tobacco abuse  Seizure disorder  Continue n.p.o., IV fluids, pain and nausea medications. Patient appears to be in mild withdrawal based on his elevated blood pressure, anxious demeanor and slight tremor. He needs more Ativan. Continue thiamine as well. We discussed the need to quit drinking and he is interested in quitting. Social work will be consulted. His COPD is stable currently. His blood pressure is elevated probably from pain and withdrawal. I will order clonidine patch which will help with blood pressure and withdrawal symptoms.   LOS: 1 day   Annel Zunker L 08/15/2011, 6:36 PM

## 2011-08-16 LAB — URINALYSIS, ROUTINE W REFLEX MICROSCOPIC
Leukocytes, UA: NEGATIVE
Nitrite: NEGATIVE
Specific Gravity, Urine: >1.03 — ABNORMAL HIGH
Urobilinogen, UA: 1
pH: 5.5

## 2011-08-16 LAB — BASIC METABOLIC PANEL
BUN: 2 — ABNORMAL LOW
BUN: 3 — ABNORMAL LOW
BUN: 7
BUN: 7
CO2: 24
CO2: 25
CO2: 26
CO2: 28
Calcium: 8.1 — ABNORMAL LOW
Calcium: 8.9
Calcium: 9.2
Calcium: 9.3
Calcium: 9.4
Chloride: 100
Chloride: 103
Chloride: 103
Chloride: 104
Chloride: 97
Creatinine, Ser: 0.36 — ABNORMAL LOW
Creatinine, Ser: 0.54
Creatinine, Ser: 0.57
Creatinine, Ser: 0.58
Creatinine, Ser: 0.59
GFR calc Af Amer: >60
GFR calc Af Amer: >60
GFR calc Af Amer: >60
GFR calc Af Amer: >60
GFR calc non Af Amer: >60
GFR calc non Af Amer: >60
GFR calc non Af Amer: >60
GFR calc non Af Amer: >60
Glucose, Bld: 134 — ABNORMAL HIGH
Glucose, Bld: 89
Glucose, Bld: 92
Glucose, Bld: 99
Glucose, Bld: 99
Potassium: 3.1 — ABNORMAL LOW
Potassium: 3.2 — ABNORMAL LOW
Potassium: 3.2 — ABNORMAL LOW
Potassium: 3.7
Sodium: 133 — ABNORMAL LOW
Sodium: 135
Sodium: 135
Sodium: 140

## 2011-08-16 LAB — DIFFERENTIAL
Basophils Absolute: 0
Basophils Absolute: 0
Basophils Absolute: 0
Basophils Relative: 0
Eosinophils Absolute: 0
Eosinophils Absolute: 0
Eosinophils Relative: 0
Eosinophils Relative: 0
Eosinophils Relative: 1
Lymphocytes Relative: 15
Lymphocytes Relative: 16
Lymphocytes Relative: 17
Lymphs Abs: 0.6 — ABNORMAL LOW
Lymphs Abs: 1.6
Lymphs Abs: 1.7
Lymphs Abs: 1.8
Monocytes Absolute: 0.5
Monocytes Absolute: 0.9 — ABNORMAL HIGH
Monocytes Absolute: 1.4 — ABNORMAL HIGH
Monocytes Relative: 11
Monocytes Relative: 7
Monocytes Relative: 9
Neutro Abs: 6.5
Neutro Abs: 8 — ABNORMAL HIGH
Neutrophils Relative %: 85 — ABNORMAL HIGH

## 2011-08-16 LAB — CBC
HCT: 29.1 — ABNORMAL LOW
HCT: 30.9 — ABNORMAL LOW
HCT: 36.6 — ABNORMAL LOW
HCT: 39.4
HCT: 41.3
Hemoglobin: 10.3 — ABNORMAL LOW
Hemoglobin: 10.6 — ABNORMAL LOW
Hemoglobin: 11 — ABNORMAL LOW
Hemoglobin: 13
Hemoglobin: 13.4
Hemoglobin: 14
MCHC: 34
MCHC: 34.4
MCHC: 35.1
MCV: 104 — ABNORMAL HIGH
MCV: 104.1 — ABNORMAL HIGH
MCV: 105 — ABNORMAL HIGH
MCV: 105.6 — ABNORMAL HIGH
MCV: 106.3 — ABNORMAL HIGH
Platelets: 204
Platelets: 240
Platelets: 288
RBC: 2.76 — ABNORMAL LOW
RBC: 2.98 — ABNORMAL LOW
RBC: 3.51 — ABNORMAL LOW
RBC: 3.58 — ABNORMAL LOW
RBC: 3.73 — ABNORMAL LOW
RBC: 3.92 — ABNORMAL LOW
RDW: 13
RDW: 13
RDW: 13.2
RDW: 13.2
WBC: 11.4 — ABNORMAL HIGH
WBC: 13.9 — ABNORMAL HIGH
WBC: 7.7

## 2011-08-16 LAB — BLOOD GAS, ARTERIAL
Acid-Base Excess: 2.8 — ABNORMAL HIGH
Acid-base deficit: 1
Bicarbonate: 23.3
Bicarbonate: 25.2 — ABNORMAL HIGH
FIO2: 0.5
FIO2: 0.6
MECHVT: 550
O2 Content: 4
O2 Saturation: 95.2
O2 Saturation: 96.1
O2 Saturation: 97.4
PEEP: 5
PEEP: 5
PEEP: 5
Patient temperature: 37
Patient temperature: 37
Patient temperature: 37
RATE: 12
RATE: 12
RATE: 12
TCO2: 20.5
TCO2: 23.9
pCO2 arterial: 37.2
pCO2 arterial: 38.2
pH, Arterial: 7.417
pH, Arterial: 7.455 — ABNORMAL HIGH
pH, Arterial: 7.456 — ABNORMAL HIGH
pH, Arterial: 7.463 — ABNORMAL HIGH
pO2, Arterial: 283 — ABNORMAL HIGH
pO2, Arterial: 59.3 — ABNORMAL LOW
pO2, Arterial: 72.5 — ABNORMAL LOW
pO2, Arterial: 86
pO2, Arterial: 90.8

## 2011-08-16 LAB — PHENYTOIN LEVEL, TOTAL
Phenytoin Lvl: 15.3
Phenytoin Lvl: 8.1 — ABNORMAL LOW
Phenytoin Lvl: <2.5 — ABNORMAL LOW

## 2011-08-16 LAB — COMPREHENSIVE METABOLIC PANEL
Alkaline Phosphatase: 100
BUN: 9
Creatinine, Ser: 0.69
Glucose, Bld: 79
Potassium: 4.2
Total Bilirubin: 1.4 — ABNORMAL HIGH
Total Protein: 7.2

## 2011-08-16 LAB — CULTURE, BLOOD (ROUTINE X 2)
Culture: NO GROWTH
Culture: NO GROWTH
Culture: NO GROWTH
Report Status: 6122008

## 2011-08-16 LAB — CULTURE, RESPIRATORY W GRAM STAIN

## 2011-08-16 LAB — CHOLESTEROL, TOTAL: Cholesterol: 130

## 2011-08-16 LAB — POTASSIUM: Potassium: 3.9

## 2011-08-16 LAB — HEPATIC FUNCTION PANEL
ALT: 23
Bilirubin, Direct: 0.2
Indirect Bilirubin: 0.3
Total Protein: 7.1

## 2011-08-16 LAB — POCT I-STAT 3, ART BLOOD GAS (G3+)
O2 Saturation: 91
Patient temperature: 98.7
TCO2: 24
pCO2 arterial: 32.8 — ABNORMAL LOW

## 2011-08-16 LAB — URINE MICROSCOPIC-ADD ON

## 2011-08-16 LAB — CLOSTRIDIUM DIFFICILE EIA

## 2011-08-16 LAB — VITAMIN B12: Vitamin B-12: 527 (ref 211–911)

## 2011-08-16 LAB — PROTIME-INR: Prothrombin Time: 12.3

## 2011-08-16 LAB — RAPID URINE DRUG SCREEN, HOSP PERFORMED
Amphetamines: NOT DETECTED
Benzodiazepines: POSITIVE — AB
Opiates: NOT DETECTED
Tetrahydrocannabinol: POSITIVE — AB

## 2011-08-16 LAB — FOLATE: Folate: >20

## 2011-08-16 LAB — ETHANOL: Alcohol, Ethyl (B): <5

## 2011-08-16 LAB — IRON AND TIBC: UIBC: 183

## 2011-08-16 LAB — CK: Total CK: 734 — ABNORMAL HIGH

## 2011-08-16 MED ORDER — VITAMIN B-1 100 MG PO TABS
100.0000 mg | ORAL_TABLET | Freq: Every day | ORAL | Status: DC
  Administered 2011-08-17: 100 mg via ORAL
  Filled 2011-08-16: qty 1

## 2011-08-16 MED ORDER — LORAZEPAM 2 MG/ML IJ SOLN
1.0000 mg | Freq: Two times a day (BID) | INTRAMUSCULAR | Status: DC
  Administered 2011-08-16 – 2011-08-17 (×3): 1 mg via INTRAVENOUS
  Filled 2011-08-16 (×2): qty 1

## 2011-08-16 MED ORDER — SODIUM CHLORIDE 0.9 % IJ SOLN
INTRAMUSCULAR | Status: AC
  Administered 2011-08-16: 10 mL
  Filled 2011-08-16: qty 10

## 2011-08-16 NOTE — Progress Notes (Signed)
Subjective: Pain continues. No nausea.  Objective: Vital signs in last 24 hours: Filed Vitals:   08/15/11 1455 08/15/11 2203 08/16/11 0616 08/16/11 0733  BP:  151/88 135/74   Pulse:  81 83   Temp:  97.6 F (36.4 C) 97.6 F (36.4 C)   TempSrc:      Resp:  20 20   Height:      Weight:      SpO2: 98% 100% 94% 98%   Weight change:   Intake/Output Summary (Last 24 hours) at 08/16/11 1303 Last data filed at 08/16/11 0555  Gross per 24 hour  Intake 4979.17 ml  Output      0 ml  Net 4979.17 ml   General: Nodding off, oriented and appropriate. Cooperative. Lungs clear to auscultation bilaterally without wheezes rhonchi or rales Cardiovascular regular rate rhythm without murmurs gallops rubs Abdomen flat normal bowel sounds soft mild epigastric tenderness Extremities no clubbing cyanosis or edema Neurologic no tremor  Lab Results: Basic Metabolic Panel:  Lab 08/15/11 0454 08/14/11 1012  NA 136 133*  K 4.2 4.0  CL 101 94*  CO2 28 28  GLUCOSE 81 100*  BUN 4* 5*  CREATININE 0.44* 0.44*  CALCIUM 8.7 9.4  MG -- --  PHOS -- --   Liver Function Tests:  Lab 08/15/11 0501 08/14/11 1012  AST 23 29  ALT 9 13  ALKPHOS 101 125*  BILITOT 0.7 0.7  PROT 6.0 7.8  ALBUMIN 3.2* 4.2    Lab 08/16/11 0452 08/15/11 0501  LIPASE 101* 201*  AMYLASE -- --   No results found for this basename: AMMONIA:2 in the last 168 hours CBC:  Lab 08/14/11 1012  WBC 11.3*  NEUTROABS 8.4*  HGB 14.9  HCT 44.9  MCV 114.2*  PLT 249   Cardiac Enzymes: No results found for this basename: CKTOTAL:3,CKMB:3,CKMBINDEX:3,TROPONINI:3 in the last 168 hours BNP: No results found for this basename: POCBNP:3 in the last 168 hours D-Dimer: No results found for this basename: DDIMER:2 in the last 168 hours CBG: No results found for this basename: GLUCAP:6 in the last 168 hours Hemoglobin A1C: No results found for this basename: HGBA1C in the last 168 hours Fasting Lipid Panel: No results found  for this basename: CHOL,HDL,LDLCALC,TRIG,CHOLHDL,LDLDIRECT in the last 098 hours Thyroid Function Tests: No results found for this basename: TSH,T4TOTAL,FREET4,T3FREE,THYROIDAB in the last 168 hours Anemia Panel: No results found for this basename: VITAMINB12,FOLATE,FERRITIN,TIBC,IRON,RETICCTPCT in the last 168 hours   Micro Results: Recent Results (from the past 240 hour(s))  URINE CULTURE     Status: Normal   Collection Time   08/14/11 11:03 AM      Component Value Range Status Comment   Specimen Description URINE, CLEAN CATCH   Final    Special Requests NONE   Final    Setup Time [id number]   Final    Colony Count NO GROWTH   Final    Culture NO GROWTH   Final    Report Status 08/15/2011 FINAL   Final    Studies/Results: No results found. Scheduled Meds:    . albuterol  2.5 mg Nebulization TID  . carbamazepine  400 mg Oral BID  . cloNIDine  0.1 mg Transdermal Weekly  . enoxaparin  40 mg Subcutaneous Daily  . ipratropium  0.5 mg Nebulization TID  . levETIRAcetam  500 mg Oral BID  . LORazepam  1 mg Intravenous Once  . LORazepam  1 mg Intravenous Q12H  . nicotine  14 mg Transdermal Daily  .  sodium chloride      . vitamin B-1  100 mg Oral Daily  . DISCONTD: folic acid  1 mg Oral Daily  . DISCONTD: LORazepam  1 mg Intravenous Q8H  . DISCONTD: multivitamins ther. w/minerals  1 tablet Oral Daily  . DISCONTD: pantoprazole (PROTONIX) IV  40 mg Intravenous Q24H  . DISCONTD: sodium chloride      . DISCONTD: thiamine  100 mg Intravenous Daily  . DISCONTD: vitamin B-1  100 mg Oral Daily   Continuous Infusions:    . sodium chloride 100 mL/hr at 08/16/11 0342   PRN Meds:.albuterol, HYDROmorphone (DILAUDID) injection, LORazepam, LORazepam, ondansetron, DISCONTD:  HYDROmorphone (DILAUDID) injection, DISCONTD: ondansetron (ZOFRAN) IV Assessment/Plan: Principal Problem:  *Acute alcoholic pancreatitis Active Problems:  Alcohol abuse, continuous  COPD (chronic obstructive  pulmonary disease)  Tobacco abuse  Seizure disorder  Alcohol withdrawal symptoms improving. Change Ativan to every 12 but continue Ativan as needed. We will start clear liquids and decrease IV fluids. Change IV medications to by mouth. Blood pressure is better on Catapres patch.   LOS: 2 days   Shawanda Sievert L 08/16/2011, 1:03 PM

## 2011-08-17 ENCOUNTER — Encounter (HOSPITAL_COMMUNITY): Payer: Self-pay | Admitting: Internal Medicine

## 2011-08-17 DIAGNOSIS — D7589 Other specified diseases of blood and blood-forming organs: Secondary | ICD-10-CM

## 2011-08-17 DIAGNOSIS — F1210 Cannabis abuse, uncomplicated: Secondary | ICD-10-CM | POA: Diagnosis present

## 2011-08-17 DIAGNOSIS — R001 Bradycardia, unspecified: Secondary | ICD-10-CM | POA: Diagnosis present

## 2011-08-17 DIAGNOSIS — F10239 Alcohol dependence with withdrawal, unspecified: Secondary | ICD-10-CM | POA: Diagnosis not present

## 2011-08-17 HISTORY — DX: Other specified diseases of blood and blood-forming organs: D75.89

## 2011-08-17 HISTORY — DX: Bradycardia, unspecified: R00.1

## 2011-08-17 MED ORDER — SENNOSIDES-DOCUSATE SODIUM 8.6-50 MG PO TABS: 1.0000 | ORAL_TABLET | Freq: Every day | ORAL | Status: DC

## 2011-08-17 NOTE — Progress Notes (Signed)
Subjective: He still has epigastric pain, but a lot less. No N/V.  Objective: Vital signs in last 24 hours: Filed Vitals:   08/17/11 0534 08/17/11 0731 08/17/11 1408 08/17/11 1413  BP: 128/80   129/81  Pulse: 58 53 93 93  Temp: 98.2 F (36.8 C)   98.2 F (36.8 C)  TempSrc: Oral   Oral  Resp: 18 18 18 18   Height:      Weight:      SpO2: 95% 98% 96% 96%    Intake/Output Summary (Last 24 hours) at 08/17/11 1531 Last data filed at 08/17/11 0620  Gross per 24 hour  Intake 3166.17 ml  Output      0 ml  Net 3166.17 ml    Weight change:   Exam: Lungs: CTA bilateraaly. Heart: S1 S2, with mild bradycardia. Abdomen:Positive bowel sounds, mild epigastric tenderness. Extremities: No pedal edema. Neuro/Psych: A & O x 3 No tremulousness.  Lab Results: Basic Metabolic Panel:  Basename 08/15/11 0501  NA 136  K 4.2  CL 101  CO2 28  GLUCOSE 81  BUN 4*  CREATININE 0.44*  CALCIUM 8.7  MG --  PHOS --   Liver Function Tests:  Basename 08/15/11 0501  AST 23  ALT 9  ALKPHOS 101  BILITOT 0.7  PROT 6.0  ALBUMIN 3.2*    Basename 08/17/11 0522 08/16/11 0452  LIPASE 75* 101*  AMYLASE -- --   No results found for this basename: AMMONIA:2 in the last 72 hours CBC: No results found for this basename: WBC:2,NEUTROABS:2,HGB:2,HCT:2,MCV:2,PLT:2 in the last 72 hours Cardiac Enzymes: No results found for this basename: CKTOTAL:3,CKMB:3,CKMBINDEX:3,TROPONINI:3 in the last 72 hours BNP: No results found for this basename: POCBNP:3 in the last 72 hours D-Dimer: No results found for this basename: DDIMER:2 in the last 72 hours CBG: No results found for this basename: GLUCAP:6 in the last 72 hours Hemoglobin A1C: No results found for this basename: HGBA1C in the last 72 hours Fasting Lipid Panel: No results found for this basename: CHOL,HDL,LDLCALC,TRIG,CHOLHDL,LDLDIRECT in the last 72 hours Thyroid Function Tests: No results found for this basename:  TSH,T4TOTAL,FREET4,T3FREE,THYROIDAB in the last 72 hours Anemia Panel: No results found for this basename: VITAMINB12,FOLATE,FERRITIN,TIBC,IRON,RETICCTPCT in the last 72 hours  Drug Screen: Positive for marijuana  Alcohol Level: No results found for this basename: ETH:2 in the last 72 hours     Micro: Recent Results (from the past 240 hour(s))  URINE CULTURE     Status: Normal   Collection Time   08/14/11 11:03 AM      Component Value Range Status Comment   Specimen Description URINE, CLEAN CATCH   Final    Special Requests NONE   Final    Setup Time [id number]   Final    Colony Count NO GROWTH   Final    Culture NO GROWTH   Final    Report Status 08/15/2011 FINAL   Final     Studies/Results: No results found.  Medications: I have reviewed the patient's current medications.  Assessment: Principal Problem:  *Acute alcoholic pancreatitis Active Problems:  Alcohol abuse, continuous  Tobacco abuse  Seizure disorder  COPD (chronic obstructive pulmonary disease)  Macrocytosis  Marijuana abuse  His pain is subsiding. His blood lipase is decreasing.  He is not tremulous. He is intermittently bradycardic, therefore is TSH will be rechecked. His blood pressure has improved on Catapres. All of his other conditions are stable.   Plan: 1. Advance diet to full liquids. Consult the social worker  for substance abuse counseling. Order a TSH. Check other labs in the morning.   LOS: 3 days   [name] 08/17/2011, 3:31 PM

## 2011-08-18 LAB — CBC
HCT: 37.1 % — ABNORMAL LOW (ref 39.0–52.0)
Platelets: 200 10*3/uL (ref 150–400)
RDW: 13.3 % (ref 11.5–15.5)
WBC: 10.3 10*3/uL (ref 4.0–10.5)

## 2011-08-18 LAB — COMPREHENSIVE METABOLIC PANEL
Alkaline Phosphatase: 135 U/L — ABNORMAL HIGH (ref 39–117)
BUN: <3 mg/dL — ABNORMAL LOW (ref 6–23)
Creatinine, Ser: 0.5 mg/dL (ref 0.50–1.35)
GFR calc Af Amer: >90 mL/min (ref >90)
Glucose, Bld: 107 mg/dL — ABNORMAL HIGH (ref 70–99)
Potassium: 3.6 mEq/L (ref 3.5–5.1)
Total Protein: 7.2 g/dL (ref 6.0–8.3)

## 2011-08-18 MED ORDER — THIAMINE HCL 100 MG/ML IJ SOLN: 100.0000 mg | Freq: Every day | INTRAMUSCULAR | Status: DC

## 2011-08-18 MED ORDER — THERA M PLUS PO TABS: 1.0000 | ORAL_TABLET | Freq: Every day | ORAL | Status: DC

## 2011-08-18 MED ORDER — HALOPERIDOL LACTATE 5 MG/ML IJ SOLN: 5.0000 mg | Freq: Four times a day (QID) | INTRAMUSCULAR | Status: DC | PRN

## 2011-08-18 MED ORDER — LORAZEPAM 2 MG/ML IJ SOLN
2.0000 mg | INTRAMUSCULAR | Status: AC
  Administered 2011-08-18: 2 mg via INTRAVENOUS
  Filled 2011-08-18: qty 1

## 2011-08-18 MED ORDER — LORAZEPAM 1 MG PO TABS: 1.0000 mg | ORAL_TABLET | Freq: Four times a day (QID) | ORAL | Status: DC | PRN

## 2011-08-18 MED ORDER — LORAZEPAM 2 MG/ML IJ SOLN: 1.0000 mg | Freq: Four times a day (QID) | INTRAMUSCULAR | Status: DC | PRN

## 2011-08-18 MED ORDER — SODIUM CHLORIDE 0.9 % IJ SOLN
INTRAMUSCULAR | Status: AC
  Filled 2011-08-18: qty 10

## 2011-08-18 MED ORDER — FOLIC ACID 1 MG PO TABS: 1.0000 mg | ORAL_TABLET | Freq: Every day | ORAL | Status: DC

## 2011-08-18 MED ORDER — LORAZEPAM 2 MG/ML IJ SOLN
0.0000 mg | Freq: Four times a day (QID) | INTRAMUSCULAR | Status: DC
  Administered 2011-08-18: 2 mg via INTRAVENOUS
  Filled 2011-08-18: qty 1

## 2011-08-18 MED ORDER — NICOTINE 21 MG/24HR TD PT24
21.0000 mg | MEDICATED_PATCH | Freq: Every day | TRANSDERMAL | Status: DC
  Administered 2011-08-18: 21 mg via TRANSDERMAL
  Filled 2011-08-18: qty 1

## 2011-08-18 NOTE — Progress Notes (Signed)
Called to room by patient stating he wanted to leave AMA.  Found patient very upset stating he had called his cousin to pick him up and that he was leaving AMA.  Paged MD to floor.  Dr. Sherrie Mustache went over risks of leaving and benefits of staying.  Pt still wanting to go AMA.  Patient signed AMA paper and left floor by self.

## 2011-08-18 NOTE — Discharge Summary (Signed)
Physician Discharge Summary  [name] MRN: [id number] DOB/AGE: August 21, 1976 35 y.o.  PCP: [name], MD   Admit date: 08/14/2011 Discharge date: 08/18/2011  Discharge Diagnoses:  1. The patient left AMA. 2. Acute alcoholic pancreatitis, recurrent. 3. Alcohol abuse/alcoholism. 4. Gen. alcohol withdrawal syndrome. 5. Tobacco abuse. 6. Marijuana use. 7. Macrocytosis, likely secondary to alcohol abuse. 8. COPD. 9. Elevated blood pressure. 10. Seizure disorder.           Discharge Medication List as of 08/18/2011  8:16 AM    CONTINUE these medications which have NOT CHANGED   Details  carbamazepine (TEGRETOL) 200 MG tablet Take 200-400 mg by mouth 4 (four) times daily. Take 2 tablets every morning, 1 tablet at noon, 1 tablet every evening, and if needed may take 1 at bedtime., Until Discontinued, Historical Med    levETIRAcetam (KEPPRA) 500 MG tablet Take 500 mg by mouth 2 (two) times daily. , Until Discontinued, Historical Med    oxyCODONE-acetaminophen (TYLOX) 5-500 MG per capsule Take 2 capsules by mouth every 6 (six) hours as needed. For pain, Until Discontinued, Historical Med        Discharge Condition: Stable and alert and oriented.  Disposition: Left Against Medical Advice   Consults: None.   Significant Diagnostic Studies: Dg Abd Acute W/chest  08/14/2011  *RADIOLOGY REPORT*  Clinical Data: Pancreatitis, mid to left abdominal pain  ACUTE ABDOMEN SERIES (ABDOMEN 2 VIEW & CHEST 1 VIEW)  Comparison: 07/06/2011  Findings: Normal heart size, mediastinal contours, and pulmonary vascularity. Probable bilateral nipple shadows. No pulmonary infiltrate or pleural effusion. Mild bronchitic changes. Prominent small bowel loop in left upper abdomen though gas is present within the colon. No bowel wall thickening or free intraperitoneal air. Bones questionably mildly demineralized. No definite urinary tract calcification.  IMPRESSION: Bronchitic changes. Prominent  small bowel loop in the left mid abdomen, which could reflect a regional ileus in a patient with pancreatitis. No definite evidence bowel obstruction or perforation identified.  Original Report Authenticated By: Lollie Marrow, M.D.     Microbiology: Recent Results (from the past 240 hour(s))  URINE CULTURE     Status: Normal   Collection Time   08/14/11 11:03 AM      Component Value Range Status Comment   Specimen Description URINE, CLEAN CATCH   Final    Special Requests NONE   Final    Setup Time [id number]   Final    Colony Count NO GROWTH   Final    Culture NO GROWTH   Final    Report Status 08/15/2011 FINAL   Final      Labs: Results for orders placed during the hospital encounter of 08/14/11 (from the past 48 hour(s))  LIPASE, BLOOD     Status: Abnormal   Collection Time   08/17/11  5:22 AM      Component Value Range Comment   Lipase 75 (*) 11 - 59 (U/L)   COMPREHENSIVE METABOLIC PANEL     Status: Abnormal   Collection Time   08/18/11  6:19 AM      Component Value Range Comment   Sodium 135  135 - 145 (mEq/L)    Potassium 3.6  3.5 - 5.1 (mEq/L)    Chloride 95 (*) 96 - 112 (mEq/L)    CO2 31  19 - 32 (mEq/L)    Glucose, Bld 107 (*) 70 - 99 (mg/dL)    BUN <3 (*) 6 - 23 (mg/dL) REPEATED TO VERIFY  Creatinine, Ser 0.50  0.50 - 1.35 (mg/dL)    Calcium 9.7  8.4 - 10.5 (mg/dL)    Total Protein 7.2  6.0 - 8.3 (g/dL)    Albumin 3.6  3.5 - 5.2 (g/dL)    AST 21  0 - 37 (U/L)    ALT 10  0 - 53 (U/L)    Alkaline Phosphatase 135 (*) 39 - 117 (U/L)    Total Bilirubin 0.4  0.3 - 1.2 (mg/dL)    GFR calc non Af Amer >90  >90 (mL/min)    GFR calc Af Amer >90  >90 (mL/min)   CBC     Status: Abnormal   Collection Time   08/18/11  6:19 AM      Component Value Range Comment   WBC 10.3  4.0 - 10.5 (K/uL)    RBC 3.16 (*) 4.22 - 5.81 (MIL/uL)    Hemoglobin 12.2 (*) 13.0 - 17.0 (g/dL)    HCT 16.1 (*) 09.6 - 52.0 (%)    MCV 117.4 (*) 78.0 - 100.0 (fL)    MCH 38.6 (*) 26.0 - 34.0  (pg)    MCHC 32.9  30.0 - 36.0 (g/dL)    RDW 04.5  40.9 - 81.1 (%)    Platelets 200  150 - 400 (K/uL)   LIPASE, BLOOD     Status: Normal   Collection Time   08/18/11  6:19 AM      Component Value Range Comment   Lipase 21  11 - 59 (U/L)      HPI :This is a 35 year old male with a history of pancreatitis alcohol abuse who came to the hospital with abdominal pain. The abdominal pain started the night before admission and had continued. Patient has a history of alcohol abuse and as per the patient his last drink was on Sunday. Patient said that he drank 2 beers on that day. Patient did have one episode of nausea and vomiting, though, he denied any diarrhea. Patient has a previous history of pancreatitis. No chest pain, no shortness of breath.   HOSPITAL COURSE: During the initial evaluation, the patient was afebrile and hemodynamically stable, though he was mildly hypertensive. His PT/INR was within normal limits. His WBC was slightly elevated at 11.3. His hemoglobin was within normal limits at 14.9. His MCV was elevated at 114. His serum sodium was slightly low 133. His AST and ALT and bilirubin were within normal limits. His lipase was 606. His alcohol level was less than 11. His urinalysis was not indicative of infection. His acute abdominal series revealed bronchitic changes and findings suggestive of a possible ileus. He was started on IV fluids for hydration. Antiemetics and is analgesics were ordered as needed for pain. Vitamin therapy was started. The Ativan withdrawal protocol (CIWA protocol) was ordered. Bronchodilators were ordered as needed for chronic bronchitis. He was kept initially n.p.o. except for his anti-seizure medications. A nicotine patch was placed. He was advised to stop smoking. He was also encouraged to seek assistance for her alcoholism.  Additional studies were ordered. His TSH was within normal limits. His urine drug screen revealed marijuana only. 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 Approximately 24 hours after admission, his blood pressure and heart rate began to increase. A Catapres patch was placed. He was started on scheduled Ativan every 12 hours. Later, after the Catapres was started, his blood pressure normalized and he became intermittently bradycardic. During sleep, his heart rate decreased to  the mid to upper 50s but never below that.  With supportive treatment, his symptoms subsided. His lipase decreased appropriately daily. His diet was advanced to a clear liquid diet, then to a full liquid diet. He tolerated the advancement well. The Ativan and as needed Dilaudid were decreased.  The patient decided that he wanted to leave AGAINST MEDICAL ADVICE. The night before he left, it was believed that he was cognitively impaired, and therefore he was not allowed to leave. He complained about the care he had received. The following morning, this morning, I evaluated the patient and found him alert, oriented, and with a plan of action following discharge. He was not tremulous. His gait was as steady as it had been in the past. I encouraged him to stay for an additional 24 hours for advancement of his diet and monitoring, however, he refused. I did encourage him to seek help at Alcoholics Anonymous and to refrain from drinking alcohol. He was hemodynamically stable and afebrile at the time that he left AMA. His blood lipase had normalized to 21.      Discharge Exam: Not performed, as the patient left AMA Blood pressure 117/77, pulse 101, temperature 97.5 F (36.4 C), temperature source Oral, resp. rate 18, height 5\' 8"  (1.727 m), weight 55.248 kg (121 lb 12.8 oz), SpO2 94.00%.         Signed: Samira Acero 08/18/2011, 4:31 PM

## 2011-08-18 NOTE — Progress Notes (Addendum)
Patient was found in hallway, very upset, pulling out iv and catapress patch saying that he was sick of being here, he wasn't getting any treatment and that he was going to leave.    Patient was agitated and began to leave floor.  Nursing staff and security were able to stop patient from leaving floor.  Patient claimed that "we" had woke him up with the telephone and he was going to leave because he wasn't getting treatment..  Doctor Orvan Falconer made aware of patient condition.  Nursing staff and security and police department stayed with patient until the doctor arrived.  He found patient unfit cognitively to be discharged or to leave AMA.  Patient advised he was going to have to stay.  Patient still agitated and rambling about various topics.  IV restarted and MD started patient on CIWA protocol.  Patient placed on safety precautions and sitter is in room with patient.

## 2011-08-19 LAB — THC (MARIJUANA), URINE, CONFIRMATION: Marijuana, Ur-Confirmation: 35 NG/ML — ABNORMAL HIGH

## 2011-09-06 ENCOUNTER — Encounter (HOSPITAL_COMMUNITY): Payer: Self-pay | Admitting: Emergency Medicine

## 2011-09-06 ENCOUNTER — Emergency Department (HOSPITAL_COMMUNITY)
Admission: EM | Admit: 2011-09-06 | Discharge: 2011-09-06 | Disposition: A | Discharge status: Home / Self Care | Payer: Medicaid Other | Attending: Emergency Medicine | Admitting: Emergency Medicine

## 2011-09-06 DIAGNOSIS — K859 Acute pancreatitis without necrosis or infection, unspecified: Secondary | ICD-10-CM

## 2011-09-06 DIAGNOSIS — J449 Chronic obstructive pulmonary disease, unspecified: Secondary | ICD-10-CM | POA: Insufficient documentation

## 2011-09-06 DIAGNOSIS — R109 Unspecified abdominal pain: Secondary | ICD-10-CM | POA: Insufficient documentation

## 2011-09-06 DIAGNOSIS — J4489 Other specified chronic obstructive pulmonary disease: Secondary | ICD-10-CM | POA: Insufficient documentation

## 2011-09-06 LAB — DIFFERENTIAL
Basophils Absolute: 0 10*3/uL (ref 0.0–0.1)
Lymphocytes Relative: 17 % (ref 12–46)
Neutro Abs: 8.9 10*3/uL — ABNORMAL HIGH (ref 1.7–7.7)

## 2011-09-06 LAB — COMPREHENSIVE METABOLIC PANEL
ALT: 15 U/L (ref 0–53)
AST: 30 U/L (ref 0–37)
CO2: 30 mEq/L (ref 19–32)
Chloride: 88 mEq/L — ABNORMAL LOW (ref 96–112)
GFR calc non Af Amer: >90 mL/min (ref >90)
Potassium: 4 mEq/L (ref 3.5–5.1)
Sodium: 130 mEq/L — ABNORMAL LOW (ref 135–145)
Total Bilirubin: 0.5 mg/dL (ref 0.3–1.2)

## 2011-09-06 LAB — CBC
HCT: 46 % (ref 39.0–52.0)
Platelets: 234 10*3/uL (ref 150–400)
RDW: 13.7 % (ref 11.5–15.5)
WBC: 12.4 10*3/uL — ABNORMAL HIGH (ref 4.0–10.5)

## 2011-09-06 MED ORDER — HYDROMORPHONE HCL PF 1 MG/ML IJ SOLN
INTRAMUSCULAR | Status: AC
  Filled 2011-09-06: qty 1

## 2011-09-06 MED ORDER — HYDROMORPHONE HCL 1 MG/ML IJ SOLN
2.0000 mg | Freq: Once | INTRAMUSCULAR | Status: AC
  Administered 2011-09-06: 2 mg via INTRAVENOUS

## 2011-09-06 MED ORDER — ONDANSETRON HCL 4 MG/2ML IJ SOLN
4.0000 mg | Freq: Once | INTRAMUSCULAR | Status: AC
  Administered 2011-09-06: 4 mg via INTRAVENOUS
  Filled 2011-09-06: qty 2

## 2011-09-06 MED ORDER — HYDROMORPHONE HCL 1 MG/ML IJ SOLN
1.0000 mg | Freq: Once | INTRAMUSCULAR | Status: AC
  Administered 2011-09-06: 1 mg via INTRAVENOUS

## 2011-09-06 MED ORDER — SODIUM CHLORIDE 0.9 % IV SOLN
Freq: Once | INTRAVENOUS | Status: AC
  Administered 2011-09-06: 14:00:00 via INTRAVENOUS

## 2011-09-06 MED ORDER — PROMETHAZINE HCL 25 MG PO TABS: 12.5000 mg | ORAL_TABLET | Freq: Four times a day (QID) | ORAL | Status: DC | PRN

## 2011-09-06 MED ORDER — OXYCODONE-ACETAMINOPHEN 5-325 MG PO TABS: 2.0000 | ORAL_TABLET | ORAL | Status: AC | PRN

## 2011-09-06 NOTE — ED Notes (Signed)
Pt c/o upper abd pain. Denies n/v/d.

## 2011-09-06 NOTE — ED Notes (Signed)
Patient provided urinal and asked to provide a sample. Attempting to give urine sample at this time.

## 2011-09-06 NOTE — ED Notes (Signed)
Patient provided sprite per request with permission of Dr Colon Branch.

## 2011-09-06 NOTE — ED Provider Notes (Signed)
History   This chart was scribed for [employer]. Colon Branch, MD by Clarita Crane. The patient was seen in room APA04/APA04 and the patient's care was started at 1:46PM.   CSN: [id number] Arrival date & time: 09/06/2011 11:39 AM   First MD Initiated Contact with Patient 09/06/11 1249      Chief Complaint  Patient presents with  . Abdominal Pain    (Consider location/radiation/quality/duration/timing/severity/associated sxs/prior treatment) HPI [name] is a 35 y.o. male who presents to the Emergency Department complaining of constant moderate to severe epigastric abdominal pain onset last night and persistent since. Patient states current pain is similar to pancreatitis previously experienced. Patient reports he was admitted to hospital for 1 week with last episode of pancreatitis. Denies recent etoh use, nausea, vomiting, diarrhea. Patient with h/o pancreatitis, seizure disorder, COPD. Patient is a current smoker. Patient last admitted in September with pancreatitis. At that time he was drinking 2-4 beers a day. He states he now only drinks a "few" beers on week ends with his friends.   Past Medical History  Diagnosis Date  . Pancreatitis   . Seizures   . Back pain   . ETOH abuse   . COPD (chronic obstructive pulmonary disease)   . SAH (subarachnoid hemorrhage)   . Macrocytosis 08/17/2011  . Bradycardia 08/17/2011    Past Surgical History  Procedure Date  . Back surgery   . Septoplasty     History reviewed. No pertinent family history.  History  Substance Use Topics  . Smoking status: Current Everyday Smoker -- 1.0 packs/day    Types: Cigarettes  . Smokeless tobacco: Not on file  . Alcohol Use: Yes     daily      Review of Systems 10 Systems reviewed and are negative for acute change except as noted in the HPI.  Allergies  Review of patient's allergies indicates no known allergies.  Home Medications   Current Outpatient Rx  Name Route Sig Dispense Refill  .  CARBAMAZEPINE 200 MG PO TABS Oral Take 200-400 mg by mouth 4 (four) times daily. Take 2 tablets every morning, 1 tablet at noon, 1 tablet every evening, and if needed may take 1 at bedtime.    Marland Kitchen LEVETIRACETAM 500 MG PO TABS Oral Take 500 mg by mouth 2 (two) times daily.     . OXYCODONE-ACETAMINOPHEN 5-500 MG PO CAPS Oral Take 2 capsules by mouth every 6 (six) hours as needed. For pain      BP 158/109  Pulse 89  Temp(Src) 98.3 F (36.8 C) (Oral)  Resp 19  Ht 5\' 8"  (1.727 m)  Wt 120 lb (54.432 kg)  BMI 18.25 kg/m2  SpO2 99%  Physical Exam  Nursing note and vitals reviewed. Constitutional: He is oriented to person, place, and time. He appears well-developed and well-nourished.       Uncomfortable appearing.   HENT:  Head: Normocephalic and atraumatic.  Eyes: EOM are normal.  Neck: Neck supple. No tracheal deviation present.  Cardiovascular: Normal rate and regular rhythm.  Exam reveals no gallop and no friction rub.   No murmur heard. Pulmonary/Chest: Effort normal. No respiratory distress.       Crackles at bilateral bases.   Abdominal: Soft. Bowel sounds are normal. He exhibits no distension. There is tenderness in the epigastric area.  Musculoskeletal: Normal range of motion. He exhibits no edema.  Neurological: He is alert and oriented to person, place, and time. No sensory deficit.  Skin: Skin is warm and  dry.  Psychiatric: He has a normal mood and affect. His behavior is normal.    ED Course  Procedures (including critical care time)  DIAGNOSTIC STUDIES: Oxygen Saturation is 99% on room air, normal by my interpretation.    COORDINATION OF CARE:    Labs Reviewed  CBC - Abnormal; Notable for the following:    WBC 12.4 (*)    RBC 4.10 (*)    MCV 112.2 (*)    MCH 39.3 (*)    All other components within normal limits  DIFFERENTIAL - Abnormal; Notable for the following:    Neutro Abs 8.9 (*)    Monocytes Absolute 1.3 (*)    All other components within normal limits    COMPREHENSIVE METABOLIC PANEL - Abnormal; Notable for the following:    Sodium 130 (*)    Chloride 88 (*)    BUN 3 (*)    Creatinine, Ser 0.37 (*)    Alkaline Phosphatase 128 (*)    All other components within normal limits  AMYLASE - Abnormal; Notable for the following:    Amylase 295 (*)    All other components within normal limits  LIPASE, BLOOD - Abnormal; Notable for the following:    Lipase 874 (*)    All other components within normal limits   No results found.   No diagnosis found.    MDM  Patient with history of pancreatitis, chronic alcohol use, multiple hospitalizations for the same. Here with pain that began last night. Denies drinking recently. Lipase 874. Received IVF, took PO fluids. Up walking in the department. Had analgesics x 2 with good relief. He does not want to be admitted. Will discharge with Rx for pain and nausea. He understands that should his condition worsen, he will return to the ER.Pt feels improved after observation and/or treatment in ED.Pt stable in ED with no significant deterioration in condition.The patient appears reasonably screened and/or stabilized for discharge and I doubt any other medical condition or other [facility] requiring further screening, evaluation, or treatment in the ED at this time prior to discharge.  MDM Reviewed: previous chart, nursing note and vitals Reviewed previous: labs and x-ray Interpretation: labs Total time providing critical care: 35.   I personally performed the services described in this documentation, which was scribed in my presence. The recorded information has been reviewed and considered.    Nicoletta Dress. Colon Branch, MD 09/06/11 [id number]

## 2011-09-06 NOTE — ED Notes (Signed)
Patient with no complaints at this time. Respirations even and unlabored. Skin warm/dry. Discharge instructions reviewed with patient at this time. Patient given opportunity to voice concerns/ask questions. IV removed per policy and band-aid applied to site. Patient discharged at this time and left Emergency Department with steady gait.  

## 2011-09-09 ENCOUNTER — Encounter (HOSPITAL_COMMUNITY): Payer: Self-pay | Admitting: Emergency Medicine

## 2011-09-09 ENCOUNTER — Emergency Department (HOSPITAL_COMMUNITY)
Admission: EM | Admit: 2011-09-09 | Discharge: 2011-09-09 | Disposition: A | Discharge status: Home / Self Care | Payer: Medicaid Other | Attending: Emergency Medicine | Admitting: Emergency Medicine

## 2011-09-09 ENCOUNTER — Emergency Department (HOSPITAL_COMMUNITY): Payer: Medicaid Other

## 2011-09-09 DIAGNOSIS — R569 Unspecified convulsions: Secondary | ICD-10-CM | POA: Insufficient documentation

## 2011-09-09 DIAGNOSIS — F17200 Nicotine dependence, unspecified, uncomplicated: Secondary | ICD-10-CM | POA: Insufficient documentation

## 2011-09-09 DIAGNOSIS — K861 Other chronic pancreatitis: Secondary | ICD-10-CM | POA: Insufficient documentation

## 2011-09-09 DIAGNOSIS — J4489 Other specified chronic obstructive pulmonary disease: Secondary | ICD-10-CM | POA: Insufficient documentation

## 2011-09-09 DIAGNOSIS — R1013 Epigastric pain: Secondary | ICD-10-CM | POA: Insufficient documentation

## 2011-09-09 DIAGNOSIS — F1010 Alcohol abuse, uncomplicated: Secondary | ICD-10-CM | POA: Insufficient documentation

## 2011-09-09 DIAGNOSIS — K859 Acute pancreatitis without necrosis or infection, unspecified: Secondary | ICD-10-CM

## 2011-09-09 DIAGNOSIS — J449 Chronic obstructive pulmonary disease, unspecified: Secondary | ICD-10-CM | POA: Insufficient documentation

## 2011-09-09 LAB — COMPREHENSIVE METABOLIC PANEL
ALT: 10 U/L (ref 0–53)
Albumin: 3.9 g/dL (ref 3.5–5.2)
Alkaline Phosphatase: 123 U/L — ABNORMAL HIGH (ref 39–117)
BUN: 3 mg/dL — ABNORMAL LOW (ref 6–23)
Chloride: 95 mEq/L — ABNORMAL LOW (ref 96–112)
Glucose, Bld: 102 mg/dL — ABNORMAL HIGH (ref 70–99)
Potassium: 4.6 mEq/L (ref 3.5–5.1)
Sodium: 132 mEq/L — ABNORMAL LOW (ref 135–145)
Total Bilirubin: 0.4 mg/dL (ref 0.3–1.2)

## 2011-09-09 LAB — CBC
HCT: 38.3 % — ABNORMAL LOW (ref 39.0–52.0)
MCV: 113.3 fL — ABNORMAL HIGH (ref 78.0–100.0)
Platelets: 167 10*3/uL (ref 150–400)
RBC: 3.38 MIL/uL — ABNORMAL LOW (ref 4.22–5.81)
WBC: 7 10*3/uL (ref 4.0–10.5)

## 2011-09-09 MED ORDER — HYDROMORPHONE HCL PF 2 MG/ML IJ SOLN
2.0000 mg | Freq: Once | INTRAMUSCULAR | Status: AC
  Administered 2011-09-09: 2 mg via INTRAMUSCULAR
  Filled 2011-09-09: qty 1

## 2011-09-09 NOTE — ED Notes (Signed)
Pt reports continued abdominal pain. No nausea or vomiting at present time.  MD informed of continued pain.

## 2011-09-09 NOTE — ED Provider Notes (Signed)
History     CSN: [id number] Arrival date & time: 09/09/2011 12:29 AM   First MD Initiated Contact with Patient 09/09/11 0041      Chief Complaint  Patient presents with  . Abdominal Pain    (Consider location/radiation/quality/duration/timing/severity/associated sxs/prior treatment) Patient is a 35 y.o. male presenting with abdominal pain. The history is provided by the patient.  Abdominal Pain The primary symptoms of the illness include abdominal pain. The primary symptoms of the illness do not include fever, fatigue, shortness of breath, nausea, vomiting, diarrhea, hematemesis or hematochezia. The current episode started yesterday (Patient has chronic abdominal pain patient was seen in the emergency department on the eighth just one day ago for the same. He states that the pain did recur yesterday after a brief period of improvement.). The problem has been gradually worsening.  The abdominal pain has been gradually worsening since its onset. The abdominal pain is generalized. The abdominal pain does not radiate. The severity of the abdominal pain is 10/10. The abdominal pain is relieved by nothing.  Symptoms associated with the illness do not include constipation, hematuria or back pain.   Patient has a history of chronic abdominal pain most likely related to chronic pancreatitis patient was seen on the eighth with diagnosis of acute pancreatitis with lipases in the 800s. History with IV fluids and IV pain medicine and discharged home. She states that her recovery time the pain was improved but then got worse yesterday morning. He is tolerating by mouth liquids fine. Patient also has a history of alcohol abuse. Patient stating that the oral pain medicine he was sent home with are not controlling the pain.  Past Medical History  Diagnosis Date  . Pancreatitis   . Seizures   . Back pain   . ETOH abuse   . COPD (chronic obstructive pulmonary disease)   . SAH (subarachnoid hemorrhage)     . Macrocytosis 08/17/2011  . Bradycardia 08/17/2011    Past Surgical History  Procedure Date  . Back surgery   . Septoplasty     History reviewed. No pertinent family history.  History  Substance Use Topics  . Smoking status: Current Everyday Smoker -- 1.0 packs/day    Types: Cigarettes  . Smokeless tobacco: Not on file  . Alcohol Use: Yes     ocassionally      Review of Systems  Constitutional: Negative for fever and fatigue.  HENT: Negative for congestion and neck pain.   Eyes: Negative for redness.  Respiratory: Negative for cough and shortness of breath.   Cardiovascular: Negative for chest pain.  Gastrointestinal: Positive for abdominal pain. Negative for nausea, vomiting, diarrhea, constipation, hematochezia and hematemesis.  Genitourinary: Negative for hematuria.  Musculoskeletal: Negative for back pain.  Skin: Negative for rash.  Neurological: Negative for headaches.  Hematological: Does not bruise/bleed easily.    Allergies  Review of patient's allergies indicates no known allergies.  Home Medications   Current Outpatient Rx  Name Route Sig Dispense Refill  . CARBAMAZEPINE 200 MG PO TABS Oral Take 200-400 mg by mouth 4 (four) times daily. Take 2 tablets every morning, 1 tablet at noon, 1 tablet every evening, and if needed may take 1 at bedtime.    Marland Kitchen LEVETIRACETAM 500 MG PO TABS Oral Take 500 mg by mouth 2 (two) times daily.     . OXYCODONE-ACETAMINOPHEN 5-325 MG PO TABS Oral Take 2 tablets by mouth every 4 (four) hours as needed for pain. 15 tablet 0  . OXYCODONE-ACETAMINOPHEN 5-500  MG PO CAPS Oral Take 2 capsules by mouth every 6 (six) hours as needed. For pain    . PROMETHAZINE HCL 25 MG PO TABS Oral Take 0.5 tablets (12.5 mg total) by mouth every 6 (six) hours as needed for nausea. 10 tablet 0    BP 177/90  Pulse 92  Temp(Src) 97.4 F (36.3 C) (Oral)  Resp 18  Ht 5\' 9"  (1.753 m)  Wt 120 lb (54.432 kg)  BMI 17.72 kg/m2  SpO2 98%  Physical  Exam  Nursing note and vitals reviewed. Constitutional: He is oriented to person, place, and time. He appears well-developed and well-nourished.  HENT:  Head: Normocephalic and atraumatic.  Mouth/Throat: Oropharynx is clear and moist.  Eyes: Conjunctivae and EOM are normal. Pupils are equal, round, and reactive to light.  Neck: Normal range of motion. Neck supple.  Cardiovascular: Normal rate, normal heart sounds and intact distal pulses.   No murmur heard. Pulmonary/Chest: Effort normal and breath sounds normal.  Abdominal: Soft. Bowel sounds are normal. There is no tenderness.  Musculoskeletal: Normal range of motion. He exhibits no edema.  Neurological: He is alert and oriented to person, place, and time. No cranial nerve deficit. He exhibits normal muscle tone. Coordination normal.  Skin: Skin is warm. No rash noted.    ED Course  Procedures (including critical care time)  Labs Reviewed  CBC - Abnormal; Notable for the following:    RBC 3.38 (*)    HCT 38.3 (*)    MCV 113.3 (*)    MCH 38.8 (*)    All other components within normal limits  LIPASE, BLOOD - Abnormal; Notable for the following:    Lipase 148 (*)    All other components within normal limits  COMPREHENSIVE METABOLIC PANEL - Abnormal; Notable for the following:    Sodium 132 (*)    Chloride 95 (*)    Glucose, Bld 102 (*)    BUN 3 (*)    Creatinine, Ser 0.44 (*)    Alkaline Phosphatase 123 (*)    All other components within normal limits   Dg Abd Acute W/chest  09/09/2011  *RADIOLOGY REPORT*  Clinical Data: Epigastric abdominal pain; weakness.  History of smoking and pancreatitis.  ACUTE ABDOMEN SERIES (ABDOMEN 2 VIEW & CHEST 1 VIEW)  Comparison: Chest abdominal radiographs performed 08/14/2011  Findings: The lungs are well-aerated and clear.  There is no evidence of focal opacification, pleural effusion or pneumothorax. The cardiomediastinal silhouette is within normal limits.  The visualized bowel gas pattern  is unremarkable.  Scattered air and stool are seen within the colon; there is no evidence of small bowel dilatation to suggest obstruction.  No free intra-abdominal air is identified on the provided upright view.  No acute osseous abnormalities are seen; the sacroiliac joints are unremarkable in appearance.  IMPRESSION:  1.  Unremarkable bowel gas pattern; no free intra-abdominal air seen. 2.  No acute cardiopulmonary process identified.  Original Report Authenticated By: Tonia Ghent, M.D.     1. Pancreatitis       MDM   Patient seen in emergency department on the eighth for abdominal pain and diagnosed with pancreatitis. Labs today show significant improvement in the lipase down from 800 to in the 100 range. As well as electrolytes show improvement. Patient is tolerating a liquids well. States pain not improved at all with Dilaudid in the emergency department the patient was given appropriate dose. Patient has pain medicines at home. Patient is no acute distress nontoxic no  acute surgical abdomen.        Shelda Jakes, MD 09/09/11 0230

## 2011-09-09 NOTE — ED Notes (Signed)
Patient complaining of abdominal pain starting today. Denies nausea/vomiting. Patient has history of pancreatitis.

## 2011-09-09 NOTE — ED Notes (Signed)
09/09/2011  Pt reports abdominal pain x 2 days.  Pt seen in ED on 11/8 for same complaint.  Denies nausea or vomiting at this time. Pt states that he hasn't followed up with PCP since being seen in ED.  Pt requesting IV dilaudid.

## 2011-09-30 ENCOUNTER — Encounter (HOSPITAL_COMMUNITY): Payer: Self-pay | Admitting: *Deleted

## 2011-09-30 ENCOUNTER — Emergency Department (HOSPITAL_COMMUNITY)
Admission: EM | Admit: 2011-09-30 | Discharge: 2011-09-30 | Disposition: A | Discharge status: Home / Self Care | Payer: Medicaid Other | Attending: Emergency Medicine | Admitting: Emergency Medicine

## 2011-09-30 DIAGNOSIS — J4489 Other specified chronic obstructive pulmonary disease: Secondary | ICD-10-CM | POA: Insufficient documentation

## 2011-09-30 DIAGNOSIS — K861 Other chronic pancreatitis: Secondary | ICD-10-CM

## 2011-09-30 DIAGNOSIS — R569 Unspecified convulsions: Secondary | ICD-10-CM | POA: Insufficient documentation

## 2011-09-30 DIAGNOSIS — J449 Chronic obstructive pulmonary disease, unspecified: Secondary | ICD-10-CM | POA: Insufficient documentation

## 2011-09-30 DIAGNOSIS — F17200 Nicotine dependence, unspecified, uncomplicated: Secondary | ICD-10-CM | POA: Insufficient documentation

## 2011-09-30 DIAGNOSIS — F1010 Alcohol abuse, uncomplicated: Secondary | ICD-10-CM | POA: Insufficient documentation

## 2011-09-30 DIAGNOSIS — E876 Hypokalemia: Secondary | ICD-10-CM | POA: Insufficient documentation

## 2011-09-30 LAB — COMPREHENSIVE METABOLIC PANEL
Albumin: 3.5 g/dL (ref 3.5–5.2)
BUN: 4 mg/dL — ABNORMAL LOW (ref 6–23)
Calcium: 8.5 mg/dL (ref 8.4–10.5)
Creatinine, Ser: 0.33 mg/dL — ABNORMAL LOW (ref 0.50–1.35)
GFR calc Af Amer: >90 mL/min (ref >90)
Glucose, Bld: 181 mg/dL — ABNORMAL HIGH (ref 70–99)
Potassium: 3 mEq/L — ABNORMAL LOW (ref 3.5–5.1)
Total Protein: 6.2 g/dL (ref 6.0–8.3)

## 2011-09-30 LAB — LIPASE, BLOOD: Lipase: 74 U/L — ABNORMAL HIGH (ref 11–59)

## 2011-09-30 LAB — CBC
HCT: 39.6 % (ref 39.0–52.0)
MCH: 40 pg — ABNORMAL HIGH (ref 26.0–34.0)
MCHC: 35.4 g/dL (ref 30.0–36.0)
RDW: 13.7 % (ref 11.5–15.5)

## 2011-09-30 MED ORDER — OXYCODONE-ACETAMINOPHEN 5-325 MG PO TABS
2.0000 | ORAL_TABLET | Freq: Once | ORAL | Status: AC
  Administered 2011-09-30: 2 via ORAL
  Filled 2011-09-30: qty 2

## 2011-09-30 MED ORDER — PROMETHAZINE HCL 25 MG PO TABS: 25.0000 mg | ORAL_TABLET | Freq: Four times a day (QID) | ORAL | Status: DC | PRN

## 2011-09-30 MED ORDER — PROMETHAZINE HCL 12.5 MG PO TABS
25.0000 mg | ORAL_TABLET | Freq: Once | ORAL | Status: AC
  Administered 2011-09-30: 25 mg via ORAL
  Filled 2011-09-30: qty 2

## 2011-09-30 MED ORDER — POTASSIUM CHLORIDE CRYS ER 20 MEQ PO TBCR
40.0000 meq | EXTENDED_RELEASE_TABLET | Freq: Once | ORAL | Status: AC
  Administered 2011-09-30: 40 meq via ORAL
  Filled 2011-09-30: qty 2

## 2011-09-30 MED ORDER — SODIUM CHLORIDE 0.9 % IV BOLUS (SEPSIS)
1000.0000 mL | Freq: Once | INTRAVENOUS | Status: AC
  Administered 2011-09-30: 1000 mL via INTRAVENOUS

## 2011-09-30 MED ORDER — OXYCODONE-ACETAMINOPHEN 5-500 MG PO CAPS: 1.0000 | ORAL_CAPSULE | Freq: Four times a day (QID) | ORAL | Status: AC | PRN

## 2011-09-30 NOTE — ED Notes (Signed)
Pt c/o severe mid abd pain above umbilical area.

## 2011-09-30 NOTE — ED Notes (Signed)
Pt c/o severe abd pain that started yesterday; pt denies any n/v/d

## 2011-09-30 NOTE — ED Provider Notes (Signed)
History     CSN: [id number] Arrival date & time: 09/30/2011  8:47 AM   First MD Initiated Contact with Patient 09/30/11 [phone]      Chief Complaint  Patient presents with  . Abdominal Pain    (Consider location/radiation/quality/duration/timing/severity/associated sxs/prior treatment) Patient is a 35 y.o. male presenting with abdominal pain. The history is provided by the patient.  Abdominal Pain The primary symptoms of the illness include abdominal pain. The primary symptoms of the illness do not include shortness of breath, vomiting or dysuria. The current episode started yesterday. The onset of the illness was gradual. The problem has been gradually worsening.  The illness is associated with alcohol use. Additional symptoms associated with the illness include heartburn. Symptoms associated with the illness do not include hematuria, frequency or back pain. Significant associated medical issues do not include PUD. Associated medical issues comments: panceatitis.    Past Medical History  Diagnosis Date  . Pancreatitis   . Seizures   . Back pain   . ETOH abuse   . COPD (chronic obstructive pulmonary disease)   . SAH (subarachnoid hemorrhage)   . Macrocytosis 08/17/2011  . Bradycardia 08/17/2011    Past Surgical History  Procedure Date  . Back surgery   . Septoplasty     History reviewed. No pertinent family history.  History  Substance Use Topics  . Smoking status: Current Everyday Smoker -- 1.0 packs/day    Types: Cigarettes  . Smokeless tobacco: Not on file  . Alcohol Use: Yes     ocassionally      Review of Systems  Constitutional: Negative for activity change.       All ROS Neg except as noted in HPI  HENT: Negative for nosebleeds and neck pain.   Eyes: Negative for photophobia and discharge.  Respiratory: Negative for cough, shortness of breath and wheezing.   Cardiovascular: Negative for chest pain and palpitations.  Gastrointestinal: Positive for  heartburn and abdominal pain. Negative for vomiting and blood in stool.  Genitourinary: Negative for dysuria, frequency and hematuria.  Musculoskeletal: Negative for back pain and arthralgias.  Skin: Negative.   Neurological: Negative for dizziness, seizures and speech difficulty.  Psychiatric/Behavioral: Negative for hallucinations and confusion.    Allergies  Review of patient's allergies indicates no known allergies.  Home Medications   Current Outpatient Rx  Name Route Sig Dispense Refill  . CARBAMAZEPINE 200 MG PO TABS Oral Take 200-400 mg by mouth 4 (four) times daily. Take 2 tablets every morning, 1 tablet at noon, 1 tablet every evening, and if needed may take 1 at bedtime.    Marland Kitchen LEVETIRACETAM 500 MG PO TABS Oral Take 500 mg by mouth 2 (two) times daily.     . OXYCODONE-ACETAMINOPHEN 5-500 MG PO CAPS Oral Take 2 capsules by mouth every 6 (six) hours as needed. For pain      BP 150/85  Pulse 92  Temp(Src) 97.5 F (36.4 C) (Oral)  Resp 18  Ht 5\' 8"  (1.727 m)  Wt 115 lb (52.164 kg)  BMI 17.49 kg/m2  SpO2 100%  Physical Exam  Nursing note and vitals reviewed. Constitutional: He is oriented to person, place, and time. He appears well-developed and well-nourished.  Non-toxic appearance.  HENT:  Head: Normocephalic.  Right Ear: Tympanic membrane and external ear normal.  Left Ear: Tympanic membrane and external ear normal.  Eyes: EOM and lids are normal. Pupils are equal, round, and reactive to light.  Neck: Normal range of motion. Neck supple. Carotid  bruit is not present.  Cardiovascular: Normal rate, regular rhythm, normal heart sounds, intact distal pulses and normal pulses.   Pulmonary/Chest: Breath sounds normal. No respiratory distress.  Abdominal: Soft. Bowel sounds are normal. He exhibits no mass. There is tenderness in the right upper quadrant. There is no guarding.       Epigastric pain to palpation. No organomegally.  Musculoskeletal: Normal range of motion.    Lymphadenopathy:       Head (right side): No submandibular adenopathy present.       Head (left side): No submandibular adenopathy present.    He has no cervical adenopathy.  Neurological: He is alert and oriented to person, place, and time. He has normal strength. No cranial nerve deficit or sensory deficit.  Skin: Skin is warm and dry. He is not diaphoretic.  Psychiatric: His speech is normal. His mood appears anxious.    ED Course: Test results reviewed with pt.  Procedures (including critical care time)  Labs Reviewed - No data to display No results found.   Dx:1 Chronic pancreatitis.  2 ETOH abuse   MDM  Test result from previous ED visits reviewed, and compared to todays visits. Lipase less than previous visits. No vomiting in ED. Vital signs stable. Discussed need to totally stop ETOH use. Safe for pt to be discharged and be seen for out patient therapy.        Kathie Dike, [place] 10/01/11 [phone]

## 2011-10-02 NOTE — ED Provider Notes (Signed)
Medical screening examination/treatment/procedure(s) were performed by non-physician practitioner and as supervising physician I was immediately available for consultation/collaboration.  Taevion Sikora L Vick Filter, MD 10/02/11 0816 

## 2011-12-09 ENCOUNTER — Encounter (HOSPITAL_COMMUNITY): Payer: Self-pay

## 2011-12-09 ENCOUNTER — Inpatient Hospital Stay (HOSPITAL_COMMUNITY)
Admission: EM | Admit: 2011-12-09 | Discharge: 2011-12-13 | DRG: 439 | Disposition: A | Discharge status: Home / Self Care | Payer: Medicaid Other | Attending: Internal Medicine | Admitting: Internal Medicine

## 2011-12-09 DIAGNOSIS — F10239 Alcohol dependence with withdrawal, unspecified: Secondary | ICD-10-CM

## 2011-12-09 DIAGNOSIS — K5900 Constipation, unspecified: Secondary | ICD-10-CM | POA: Diagnosis present

## 2011-12-09 DIAGNOSIS — R062 Wheezing: Secondary | ICD-10-CM

## 2011-12-09 DIAGNOSIS — R001 Bradycardia, unspecified: Secondary | ICD-10-CM

## 2011-12-09 DIAGNOSIS — J4489 Other specified chronic obstructive pulmonary disease: Secondary | ICD-10-CM | POA: Diagnosis present

## 2011-12-09 DIAGNOSIS — K861 Other chronic pancreatitis: Secondary | ICD-10-CM | POA: Diagnosis present

## 2011-12-09 DIAGNOSIS — F1210 Cannabis abuse, uncomplicated: Secondary | ICD-10-CM

## 2011-12-09 DIAGNOSIS — F10929 Alcohol use, unspecified with intoxication, unspecified: Secondary | ICD-10-CM

## 2011-12-09 DIAGNOSIS — G40909 Epilepsy, unspecified, not intractable, without status epilepticus: Secondary | ICD-10-CM | POA: Diagnosis present

## 2011-12-09 DIAGNOSIS — E871 Hypo-osmolality and hyponatremia: Secondary | ICD-10-CM

## 2011-12-09 DIAGNOSIS — F17200 Nicotine dependence, unspecified, uncomplicated: Secondary | ICD-10-CM | POA: Diagnosis present

## 2011-12-09 DIAGNOSIS — K859 Acute pancreatitis without necrosis or infection, unspecified: Principal | ICD-10-CM | POA: Diagnosis present

## 2011-12-09 DIAGNOSIS — Z79899 Other long term (current) drug therapy: Secondary | ICD-10-CM

## 2011-12-09 DIAGNOSIS — D7589 Other specified diseases of blood and blood-forming organs: Secondary | ICD-10-CM | POA: Diagnosis present

## 2011-12-09 DIAGNOSIS — J449 Chronic obstructive pulmonary disease, unspecified: Secondary | ICD-10-CM

## 2011-12-09 DIAGNOSIS — G40802 Other epilepsy, not intractable, without status epilepticus: Secondary | ICD-10-CM | POA: Diagnosis present

## 2011-12-09 DIAGNOSIS — F1010 Alcohol abuse, uncomplicated: Secondary | ICD-10-CM | POA: Diagnosis present

## 2011-12-09 DIAGNOSIS — K852 Alcohol induced acute pancreatitis without necrosis or infection: Secondary | ICD-10-CM

## 2011-12-09 DIAGNOSIS — K560 Paralytic ileus: Secondary | ICD-10-CM

## 2011-12-09 DIAGNOSIS — Z720 Tobacco use: Secondary | ICD-10-CM | POA: Diagnosis present

## 2011-12-09 LAB — CBC
HCT: 40.4 % (ref 39.0–52.0)
Hemoglobin: 14.5 g/dL (ref 13.0–17.0)
MCV: 111.9 fL — ABNORMAL HIGH (ref 78.0–100.0)
RBC: 3.61 MIL/uL — ABNORMAL LOW (ref 4.22–5.81)
WBC: 8.6 10*3/uL (ref 4.0–10.5)

## 2011-12-09 LAB — COMPREHENSIVE METABOLIC PANEL
Albumin: 3.9 g/dL (ref 3.5–5.2)
Alkaline Phosphatase: 107 U/L (ref 39–117)
BUN: <6 mg/dL — ABNORMAL LOW (ref 6–23)
CO2: 24 mEq/L (ref 19–32)
Chloride: 87 mEq/L — ABNORMAL LOW (ref 96–112)
Creatinine, Ser: 0.34 mg/dL — ABNORMAL LOW (ref 0.50–1.35)
GFR calc non Af Amer: >90 mL/min (ref >90)
Potassium: 3.3 mEq/L — ABNORMAL LOW (ref 3.5–5.1)
Total Bilirubin: 0.2 mg/dL — ABNORMAL LOW (ref 0.3–1.2)

## 2011-12-09 LAB — LIPASE, BLOOD: Lipase: 101 U/L — ABNORMAL HIGH (ref 11–59)

## 2011-12-09 LAB — ETHANOL: Alcohol, Ethyl (B): 156 mg/dL — ABNORMAL HIGH (ref 0–11)

## 2011-12-09 MED ORDER — POTASSIUM CHLORIDE 10 MEQ/100ML IV SOLN
10.0000 meq | Freq: Once | INTRAVENOUS | Status: AC
  Administered 2011-12-09: 10 meq via INTRAVENOUS
  Filled 2011-12-09: qty 100

## 2011-12-09 MED ORDER — SODIUM CHLORIDE 0.9 % IV SOLN: INTRAVENOUS | Status: DC

## 2011-12-09 MED ORDER — PANTOPRAZOLE SODIUM 40 MG IV SOLR
40.0000 mg | Freq: Once | INTRAVENOUS | Status: AC
  Administered 2011-12-09: 40 mg via INTRAVENOUS
  Filled 2011-12-09: qty 40

## 2011-12-09 MED ORDER — SODIUM CHLORIDE 0.9 % IV BOLUS (SEPSIS)
1000.0000 mL | Freq: Once | INTRAVENOUS | Status: AC
  Administered 2011-12-09: 1000 mL via INTRAVENOUS

## 2011-12-09 MED ORDER — HYDROMORPHONE HCL PF 1 MG/ML IJ SOLN
1.0000 mg | Freq: Once | INTRAMUSCULAR | Status: AC
  Administered 2011-12-09: 1 mg via INTRAVENOUS
  Filled 2011-12-09: qty 1

## 2011-12-09 MED ORDER — SODIUM CHLORIDE 0.9 % IV BOLUS (SEPSIS): 1000.0000 mL | Freq: Once | INTRAVENOUS | Status: DC

## 2011-12-09 MED ORDER — ONDANSETRON HCL 4 MG/2ML IJ SOLN
4.0000 mg | Freq: Once | INTRAMUSCULAR | Status: AC
  Administered 2011-12-09: 4 mg via INTRAVENOUS
  Filled 2011-12-09: qty 2

## 2011-12-09 NOTE — ED Notes (Signed)
Pt presents with pancreatitis flare up x 3 days.

## 2011-12-09 NOTE — H&P (Signed)
PCP:  Jose Doughty, MD, MD Chief Complaint:  Abdominal pain of 3 days' duration.  HPI:  Patient is a 36 year old Caucasian male with history of her chronic pancreatitis, chronic alcohol use and seizure disorder presenting to the emergency emergency room with abdominal pain of 3 days' duration. She claimed that he went binge drinking and thereafter developed abdominal pain which is located mainly in the epigastric region. He described the pain as colicky, 8/10 intensity and non-radiating. The pain is said to be associated with multiple episode of nausea but he denied any vomiting. He denied any diarrhea. He denied any fever, chills or Rigors. He also denied any history of dysuria hematuria. He denied any chest pain or shortness of breath. Pain was said to be getting progressively worse hence presented to the hospital to be evaluated.  Review of Systems:  The patient denies anorexia, fever, weight loss,, vision loss, decreased hearing, hoarseness, chest pain, syncope, dyspnea on exertion, peripheral edema, balance deficits, hemoptysis, abdominal pain+++, melena, hematochezia, severe indigestion/heartburn, hematuria, incontinence, genital sores, muscle weakness, suspicious skin lesions, transient blindness, difficulty walking, depression, unusual weight change, abnormal bleeding, enlarged lymph nodes, angioedema, and breast masses.  Past Medical History:  Past Medical History  Diagnosis Date  . Pancreatitis   . Seizures   . Back pain   . ETOH abuse   . COPD (chronic obstructive pulmonary disease)   . SAH (subarachnoid hemorrhage)   . Macrocytosis 08/17/2011  . Bradycardia 08/17/2011    Past Surgical History  Procedure Date  . Back surgery   . Septoplasty     Medications:  Prior to Admission medications   Medication Sig Start Date End Date Taking? Authorizing Provider  carbamazepine (TEGRETOL) 200 MG tablet Take by mouth 5 (five) times daily.    Yes Historical Provider, MD    levETIRAcetam (KEPPRA) 500 MG tablet Take 500 mg by mouth 2 (two) times daily.    Yes Historical Provider, MD  oxyCODONE-acetaminophen (TYLOX) 5-500 MG per capsule Take 2 capsules by mouth every 6 (six) hours as needed. For pain   Yes Historical Provider, MD    Allergies:  No Known Allergies  Social History:   reports that he has been smoking Cigarettes.  He has been smoking about 1 pack per day. He does not have any smokeless tobacco history on file. He reports that he drinks alcohol. He reports that he does not use illicit drugs.  Family History:  No family history on file.  Physical Exam:  Filed Vitals:   12/09/11 1900 12/09/11 1901 12/09/11 2314  BP:  158/101 122/78  Pulse:  73 58  Temp:  98.7 F (37.1 C)   TempSrc:  Oral   Resp:  20   Height: 5\' 8"  (1.727 m)    Weight: 54.432 kg (120 lb)    SpO2:  98% 96%      General: Alert and oriented times three, well developed and nourished, not in acute distress  Eyes: PERRLA, pink conjunctiva, scleral anicterus  ENT: Moist oral mucosa, neck supple, no thyromegaly  Lungs: clear to ascultation, no wheeze, no crackles, no use of accessory muscles  Cardiovascular: regular rate and rhythm, no regurgitation, no gallops, no murmurs. No carotid bruits, no JVD  Abdomen: soft, tenderness in the epigastric region, no guarding no rigidity, no organomegaly, bowel sounds are hypoactive  GU: not examined  Neuro: No lateralizing signs  Musculoskeletal: strength 5/5 all extremities, no clubbing, cyanosis or edema  Skin: Normal turgor  Psych: appropriate affect  ?  Labs on Admission:   [name] 12/09/11 2032  NA 125*  K 3.3*  CL 87*  CO2 24  GLUCOSE 77  BUN <6*  CREATININE 0.34*  CALCIUM 9.1  MG --  PHOS --     Basename 12/09/11 2032  AST 30  ALT 12  ALKPHOS 107  BILITOT 0.2*  PROT 7.4  ALBUMIN 3.9     Basename 12/09/11 2032  LIPASE 101*  AMYLASE --     Basename 12/09/11 2032  WBC 8.6  NEUTROABS  --  HGB 14.5  HCT 40.4  MCV 111.9*  PLT 221    No results found for this basename: CKTOTAL:3,CKMB:3,CKMBINDEX:3,TROPONINI:3 in the last 72 hours  No results found for this basename: TSH,T4TOTAL,FREET3,T3FREE,THYROIDAB in the last 72 hours  No results found for this basename: VITAMINB12:2,FOLATE:2,FERRITIN:2,TIBC:2,IRON:2,RETICCTPCT:2 in the last 72 hours  Radiological Exams on Admission:  No results found.  Assessment/Plan  Present on Admission:    Problems: #1 abdominal pain-epigastric #2 nausea #3 elevated lipase #4 macrocytosis #5 electrolyte imbalance i.e. hyponatremia, hypochloremia and hypokalemia  Impression: #1 acute on chronic pancreatitis #2 chronic EtOH use #3 history of seizure disorder #4 macrocytosis #5 history of COPD #6 history of chronic tobacco use.  Plan: #1 admit patient to general medical floor #2 n.p.o. for now except meds #3 pain control with IV Dilaudid #4 correct electrolyte imbalance with iv normal saline with addition of potassium #5 restart home meds #6 labs; CBC CMP and lipase to be repeated in a.with. Patient will be evaluated on daily basis        [name]             [phone]

## 2011-12-09 NOTE — ED Provider Notes (Signed)
History     CSN: [id number]  Arrival date & time 12/09/11  1844   First MD Initiated Contact with Patient 12/09/11 1857      Chief Complaint  Patient presents with  . Pancreatitis    (Consider location/radiation/quality/duration/timing/severity/associated sxs/prior treatment) The history is provided by the patient.  pt w hx etoh abuse, pancreatitis, presents w epigastric pain for past few days. Constant. Epigastric, non radiating, dull. Denies exacerbating or alleviating factors.  No same as prior pancreatitis pain. States continues to drink etoh. Denies hx pud or gallstones. Nausea. No vomiting or diarrhea. Having normal bms. No fever or chills. No cp or sob.   Past Medical History  Diagnosis Date  . Pancreatitis   . Seizures   . Back pain   . ETOH abuse   . COPD (chronic obstructive pulmonary disease)   . SAH (subarachnoid hemorrhage)   . Macrocytosis 08/17/2011  . Bradycardia 08/17/2011    Past Surgical History  Procedure Date  . Back surgery   . Septoplasty     No family history on file.  History  Substance Use Topics  . Smoking status: Current Everyday Smoker -- 1.0 packs/day    Types: Cigarettes  . Smokeless tobacco: Not on file  . Alcohol Use: Yes     ocassionally      Review of Systems  Constitutional: Negative for fever.  HENT: Negative for neck pain.   Eyes: Negative for redness.  Respiratory: Negative for shortness of breath.   Cardiovascular: Negative for chest pain.  Gastrointestinal: Positive for abdominal pain.  Genitourinary: Negative for flank pain.  Musculoskeletal: Negative for back pain.  Skin: Negative for rash.  Neurological: Negative for headaches.  Hematological: Does not bruise/bleed easily.  Psychiatric/Behavioral: Negative for confusion.    Allergies  Review of patient's allergies indicates no known allergies.  Home Medications   Current Outpatient Rx  Name Route Sig Dispense Refill  . CARBAMAZEPINE 200 MG PO TABS Oral  Take 200-400 mg by mouth 4 (four) times daily. Take 2 tablets every morning, 1 tablet at noon, 1 tablet every evening, and if needed may take 1 at bedtime.    Marland Kitchen LEVETIRACETAM 500 MG PO TABS Oral Take 500 mg by mouth 2 (two) times daily.     . OXYCODONE-ACETAMINOPHEN 5-500 MG PO CAPS Oral Take 2 capsules by mouth every 6 (six) hours as needed. For pain      BP 158/101  Pulse 73  Temp(Src) 98.7 F (37.1 C) (Oral)  Resp 20  Ht 5\' 8"  (1.727 m)  Wt 120 lb (54.432 kg)  BMI 18.25 kg/m2  SpO2 98%  Physical Exam  Nursing note and vitals reviewed. Constitutional: He is oriented to person, place, and time. He appears well-developed and well-nourished. No distress.  HENT:  Head: Atraumatic.  Eyes: Pupils are equal, round, and reactive to light. No scleral icterus.  Neck: Neck supple. No tracheal deviation present.  Cardiovascular: Normal rate, regular rhythm, normal heart sounds and intact distal pulses.   Pulmonary/Chest: Effort normal and breath sounds normal. No accessory muscle usage. No respiratory distress.  Abdominal: Soft. Bowel sounds are normal. He exhibits no distension and no mass. There is tenderness. There is no rebound and no guarding.       Epigastric tenderness  Musculoskeletal: Normal range of motion. He exhibits no edema.  Neurological: He is alert and oriented to person, place, and time.  Skin: Skin is warm and dry.  Psychiatric: He has a normal mood and affect.  ED Course  Procedures (including critical care time)  Labs Reviewed  CBC - Abnormal; Notable for the following:    RBC 3.61 (*)    MCV 111.9 (*)    MCH 40.2 (*)    All other components within normal limits  COMPREHENSIVE METABOLIC PANEL  ETHANOL  LIPASE, BLOOD   Results for orders placed during the hospital encounter of 12/09/11  CBC      Component Value Range   WBC 8.6  4.0 - 10.5 (K/uL)   RBC 3.61 (*) 4.22 - 5.81 (MIL/uL)   Hemoglobin 14.5  13.0 - 17.0 (g/dL)   HCT 16.1  09.6 - 04.5 (%)   MCV  111.9 (*) 78.0 - 100.0 (fL)   MCH 40.2 (*) 26.0 - 34.0 (pg)   MCHC 35.9  30.0 - 36.0 (g/dL)   RDW 40.9  81.1 - 91.4 (%)   Platelets 221  150 - 400 (K/uL)  COMPREHENSIVE METABOLIC PANEL      Component Value Range   Sodium 125 (*) 135 - 145 (mEq/L)   Potassium 3.3 (*) 3.5 - 5.1 (mEq/L)   Chloride 87 (*) 96 - 112 (mEq/L)   CO2 24  19 - 32 (mEq/L)   Glucose, Bld 77  70 - 99 (mg/dL)   BUN <6 (*) 6 - 23 (mg/dL)   Creatinine, Ser 7.82 (*) 0.50 - 1.35 (mg/dL)   Calcium 9.1  8.4 - 95.6 (mg/dL)   Total Protein 7.4  6.0 - 8.3 (g/dL)   Albumin 3.9  3.5 - 5.2 (g/dL)   AST 30  0 - 37 (U/L)   ALT 12  0 - 53 (U/L)   Alkaline Phosphatase 107  39 - 117 (U/L)   Total Bilirubin 0.2 (*) 0.3 - 1.2 (mg/dL)   GFR calc non Af Amer >90  >90 (mL/min)   GFR calc Af Amer >90  >90 (mL/min)  ETHANOL      Component Value Range   Alcohol, Ethyl (B) 156 (*) 0 - 11 (mg/dL)  LIPASE, BLOOD      Component Value Range   Lipase 101 (*) 11 - 59 (U/L)        MDM  Labs. Iv ns bolus. protonix iv. zofran iv. Dilaudid 1 mg iv.   2nd liter ns. kcl iv. Pain persists. Dilaudid 1 mg iv.   Na 125, pain persists - will discuss w [facility].  [facility] will admit.       Suzi Roots, MD 12/09/11 [phone]

## 2011-12-10 LAB — DIFFERENTIAL
Eosinophils Absolute: 0.1 10*3/uL (ref 0.0–0.7)
Eosinophils Relative: 1 % (ref 0–5)
Lymphocytes Relative: 19 % (ref 12–46)
Lymphs Abs: 1.6 10*3/uL (ref 0.7–4.0)
Monocytes Absolute: 0.4 10*3/uL (ref 0.1–1.0)

## 2011-12-10 LAB — COMPREHENSIVE METABOLIC PANEL
CO2: 23 mEq/L (ref 19–32)
Calcium: 8.7 mg/dL (ref 8.4–10.5)
Creatinine, Ser: 0.4 mg/dL — ABNORMAL LOW (ref 0.50–1.35)
GFR calc Af Amer: >90 mL/min (ref >90)
GFR calc non Af Amer: >90 mL/min (ref >90)
Glucose, Bld: 101 mg/dL — ABNORMAL HIGH (ref 70–99)
Sodium: 136 mEq/L (ref 135–145)
Total Protein: 6 g/dL (ref 6.0–8.3)

## 2011-12-10 LAB — CBC
HCT: 37.7 % — ABNORMAL LOW (ref 39.0–52.0)
MCH: 39.5 pg — ABNORMAL HIGH (ref 26.0–34.0)
MCV: 113.6 fL — ABNORMAL HIGH (ref 78.0–100.0)
Platelets: 217 10*3/uL (ref 150–400)
RBC: 3.32 MIL/uL — ABNORMAL LOW (ref 4.22–5.81)
RDW: 13.8 % (ref 11.5–15.5)
WBC: 8 10*3/uL (ref 4.0–10.5)

## 2011-12-10 LAB — MAGNESIUM: Magnesium: 1.7 mg/dL (ref 1.5–2.5)

## 2011-12-10 MED ORDER — NICOTINE 21 MG/24HR TD PT24
21.0000 mg | MEDICATED_PATCH | Freq: Every day | TRANSDERMAL | Status: DC
  Administered 2011-12-10 – 2011-12-12 (×3): 21 mg via TRANSDERMAL
  Filled 2011-12-10 (×4): qty 1

## 2011-12-10 MED ORDER — CARBAMAZEPINE 200 MG PO TABS
200.0000 mg | ORAL_TABLET | Freq: Once | ORAL | Status: AC
  Administered 2011-12-10: 200 mg via ORAL
  Filled 2011-12-10: qty 1

## 2011-12-10 MED ORDER — PANTOPRAZOLE SODIUM 40 MG IV SOLR
40.0000 mg | Freq: Every day | INTRAVENOUS | Status: DC
Start: 2011-12-10 — End: 2011-12-13
  Administered 2011-12-10 – 2011-12-12 (×4): 40 mg via INTRAVENOUS
  Filled 2011-12-10 (×4): qty 40

## 2011-12-10 MED ORDER — HYDROMORPHONE HCL PF 1 MG/ML IJ SOLN
1.0000 mg | INTRAMUSCULAR | Status: DC | PRN
  Administered 2011-12-10 (×5): 1 mg via INTRAVENOUS
  Filled 2011-12-10 (×5): qty 1

## 2011-12-10 MED ORDER — LEVETIRACETAM 500 MG PO TABS
500.0000 mg | ORAL_TABLET | Freq: Two times a day (BID) | ORAL | Status: DC
  Administered 2011-12-10 – 2011-12-13 (×7): 500 mg via ORAL
  Filled 2011-12-10 (×7): qty 1

## 2011-12-10 MED ORDER — CARBAMAZEPINE 200 MG PO TABS
200.0000 mg | ORAL_TABLET | Freq: Four times a day (QID) | ORAL | Status: DC
  Administered 2011-12-10 – 2011-12-13 (×13): 200 mg via ORAL
  Filled 2011-12-10 (×14): qty 1

## 2011-12-10 MED ORDER — HYDROMORPHONE HCL PF 1 MG/ML IJ SOLN
2.0000 mg | INTRAMUSCULAR | Status: DC | PRN
  Administered 2011-12-10: 2 mg via INTRAVENOUS
  Administered 2011-12-10: 1 mg via INTRAVENOUS
  Administered 2011-12-11 – 2011-12-12 (×12): 2 mg via INTRAVENOUS
  Filled 2011-12-10: qty 1
  Filled 2011-12-10 (×3): qty 2
  Filled 2011-12-10: qty 1
  Filled 2011-12-10 (×2): qty 2
  Filled 2011-12-10: qty 1
  Filled 2011-12-10: qty 2
  Filled 2011-12-10 (×3): qty 1
  Filled 2011-12-10 (×5): qty 2

## 2011-12-10 MED ORDER — SODIUM CHLORIDE 0.9 % IJ SOLN
INTRAMUSCULAR | Status: AC
  Administered 2011-12-10: 01:00:00
  Filled 2011-12-10: qty 3

## 2011-12-10 MED ORDER — LEVETIRACETAM 500 MG PO TABS
500.0000 mg | ORAL_TABLET | Freq: Once | ORAL | Status: AC
  Administered 2011-12-10: 500 mg via ORAL
  Filled 2011-12-10: qty 1

## 2011-12-10 MED ORDER — KCL IN DEXTROSE-NACL 20-5-0.9 MEQ/L-%-% IV SOLN
INTRAVENOUS | Status: DC
  Administered 2011-12-10 – 2011-12-11 (×5): via INTRAVENOUS
  Administered 2011-12-12: 1000 mL via INTRAVENOUS

## 2011-12-10 NOTE — Progress Notes (Signed)
Subjective: Still having significant pain despite 1 mg of Dilaudid every 4 hours as needed. Not feeling shaky or other signs of withdrawal.  Objective: Vital signs in last 24 hours: Filed Vitals:   12/09/11 2314 12/10/11 0046 12/10/11 0515 12/10/11 1454  BP: 122/78 157/67 103/60 129/71  Pulse: 58 70 75 81  Temp:  98 F (36.7 C) 98.6 F (37 C) 98.2 F (36.8 C)  TempSrc:  Oral Oral Oral  Resp:  18 18 20   Height:  5\' 8"  (1.727 m)    Weight:  56 kg (123 lb 7.3 oz)    SpO2: 96% 98% 93% 97%   Weight change:   Intake/Output Summary (Last 24 hours) at 12/10/11 1914 Last data filed at 12/10/11 1841  Gross per 24 hour  Intake    396 ml  Output   1625 ml  Net  -1229 ml   Physical Exam: General: Alert oriented calm and cooperative. Talking on the phone. Lungs clear to auscultation bilaterally without wheezes rhonchi or rales Cardiovascular regular rate rhythm without murmurs gallops rubs Abdomen soft diffusely tender, normal bowel sounds not distended. Extremities no clubbing cyanosis or edema. No tremmor  Lab Results: Basic Metabolic Panel:  Lab 12/10/11 1610 12/09/11 2032  NA 136 125*  K 3.5 3.3*  CL 100 87*  CO2 23 24  GLUCOSE 101* 77  BUN 3* <6*  CREATININE 0.40* 0.34*  CALCIUM 8.7 9.1  MG 1.7 --  PHOS -- --   Liver Function Tests:  Lab 12/10/11 0513 12/09/11 2032  AST 24 30  ALT 10 12  ALKPHOS 86 107  BILITOT 0.3 0.2*  PROT 6.0 7.4  ALBUMIN 3.2* 3.9    Lab 12/10/11 0513 12/09/11 2032  LIPASE 58 101*  AMYLASE -- --   No results found for this basename: AMMONIA:2 in the last 168 hours CBC:  Lab 12/10/11 0513 12/09/11 2032  WBC 8.0 8.6  NEUTROABS 5.9 --  HGB 13.1 14.5  HCT 37.7* 40.4  MCV 113.6* 111.9*  PLT 217 221   Cardiac Enzymes: No results found for this basename: CKTOTAL:3,CKMB:3,CKMBINDEX:3,TROPONINI:3 in the last 168 hours BNP: No results found for this basename: PROBNP:3 in the last 168 hours D-Dimer: No results found for this basename:  DDIMER:2 in the last 168 hours CBG: No results found for this basename: GLUCAP:6 in the last 168 hours Hemoglobin A1C: No results found for this basename: HGBA1C in the last 168 hours Fasting Lipid Panel: No results found for this basename: CHOL,HDL,LDLCALC,TRIG,CHOLHDL,LDLDIRECT in the last 960 hours Thyroid Function Tests: No results found for this basename: TSH,T4TOTAL,FREET4,T3FREE,THYROIDAB in the last 168 hours Coagulation: No results found for this basename: LABPROT:4,INR:4 in the last 168 hours Anemia Panel: No results found for this basename: VITAMINB12,FOLATE,FERRITIN,TIBC,IRON,RETICCTPCT in the last 168 hours Urine Drug Screen: Drugs of Abuse     Component Value Date/Time   LABOPIA NONE DETECTED 08/14/2011 1711   LABOPIA NEGATIVE 08/14/2011 1711   COCAINSCRNUR NEGATIVE 08/14/2011 1711   COCAINSCRNUR NONE DETECTED 08/14/2011 1711   LABBENZ NEGATIVE 08/14/2011 1711   LABBENZ NONE DETECTED 08/14/2011 1711   AMPHETMU NONE DETECTED 08/14/2011 1711   AMPHETMU NEGATIVE 08/14/2011 1711   THCU POSITIVE* 08/14/2011 1711   LABBARB NONE DETECTED 08/14/2011 1711    Alcohol Level:  Lab 12/09/11 2032  ETH 156*   Urinalysis: No results found for this basename: COLORURINE:2,APPERANCEUR:2,LABSPEC:2,PHURINE:2,GLUCOSEU:2,HGBUR:2,BILIRUBINUR:2,KETONESUR:2,PROTEINUR:2,UROBILINOGEN:2,NITRITE:2,LEUKOCYTESUR:2 in the last 168 hours   Micro Results: No results found for this or any previous visit (from the past 240 hour(s)). Studies/Results: No  results found.  Scheduled Meds:   . carbamazepine  200 mg Oral QID  . carbamazepine  200 mg Oral Once  . HYDROmorphone  1 mg Intravenous Once  . levETIRAcetam  500 mg Oral BID  . levETIRAcetam  500 mg Oral Once  . nicotine  21 mg Transdermal Daily  . ondansetron (ZOFRAN) IV  4 mg Intravenous Once  . pantoprazole (PROTONIX) IV  40 mg Intravenous Once  . pantoprazole (PROTONIX) IV  40 mg Intravenous QHS  . potassium chloride  10 mEq  Intravenous Once  . sodium chloride  1,000 mL Intravenous Once  . sodium chloride      . DISCONTD: sodium chloride  1,000 mL Intravenous Once   Continuous Infusions:   . dextrose 5 % and 0.9 % NaCl with KCl 20 mEq/[name] 100 mL/hr at 12/10/11 1422  . DISCONTD: sodium chloride     PRN Meds:.HYDROmorphone (DILAUDID) injection, DISCONTD:  HYDROmorphone (DILAUDID) injection Assessment/Plan: Principal Problem:  *Acute alcoholic pancreatitis Active Problems:  Alcohol abuse, continuous  Tobacco abuse  Seizure disorder  Macrocytosis    LOS: 1 day   [name] 12/10/2011, 7:14 PM

## 2011-12-11 NOTE — Progress Notes (Signed)
Patient eating clear liquid diet and tolerating well.

## 2011-12-11 NOTE — Progress Notes (Signed)
Subjective: Still having significant pain, but requesting something to eat Objective: Vital signs in last 24 hours: Filed Vitals:   12/10/11 1454 12/10/11 2136 12/11/11 0554 12/11/11 1522  BP: 129/71 169/85 133/88 116/72  Pulse: 81 89 82 90  Temp: 98.2 F (36.8 C) 98 F (36.7 C) 98.1 F (36.7 C) 97.9 F (36.6 C)  TempSrc: Oral Oral Oral   Resp: 20 20 20 20   Height:      Weight:   56.7 kg (125 lb)   SpO2: 97% 97% 94% 91%   Weight change: 2.268 kg (5 lb)  Intake/Output Summary (Last 24 hours) at 12/11/11 1554 Last data filed at 12/11/11 1442  Gross per 24 hour  Intake    240 ml  Output   2900 ml  Net  -2660 ml   Physical Exam: General: Alert oriented calm and cooperative. Talking on the phone. Lungs clear to auscultation bilaterally without wheezes rhonchi or rales Cardiovascular regular rate rhythm without murmurs gallops rubs Abdomen soft diffusely tender, normal bowel sounds not distended. Extremities no clubbing cyanosis or edema. No tremmor  Lab Results: Basic Metabolic Panel:  Lab 12/10/11 5621 12/09/11 2032  NA 136 125*  K 3.5 3.3*  CL 100 87*  CO2 23 24  GLUCOSE 101* 77  BUN 3* <6*  CREATININE 0.40* 0.34*  CALCIUM 8.7 9.1  MG 1.7 --  PHOS -- --   Liver Function Tests:  Lab 12/10/11 0513 12/09/11 2032  AST 24 30  ALT 10 12  ALKPHOS 86 107  BILITOT 0.3 0.2*  PROT 6.0 7.4  ALBUMIN 3.2* 3.9    Lab 12/10/11 0513 12/09/11 2032  LIPASE 58 101*  AMYLASE -- --   No results found for this basename: AMMONIA:2 in the last 168 hours CBC:  Lab 12/10/11 0513 12/09/11 2032  WBC 8.0 8.6  NEUTROABS 5.9 --  HGB 13.1 14.5  HCT 37.7* 40.4  MCV 113.6* 111.9*  PLT 217 221   Cardiac Enzymes: No results found for this basename: CKTOTAL:3,CKMB:3,CKMBINDEX:3,TROPONINI:3 in the last 168 hours BNP: No results found for this basename: PROBNP:3 in the last 168 hours D-Dimer: No results found for this basename: DDIMER:2 in the last 168 hours CBG: No results  found for this basename: GLUCAP:6 in the last 168 hours Hemoglobin A1C: No results found for this basename: HGBA1C in the last 168 hours Fasting Lipid Panel: No results found for this basename: CHOL,HDL,LDLCALC,TRIG,CHOLHDL,LDLDIRECT in the last 308 hours Thyroid Function Tests: No results found for this basename: TSH,T4TOTAL,FREET4,T3FREE,THYROIDAB in the last 168 hours Coagulation: No results found for this basename: LABPROT:4,INR:4 in the last 168 hours Anemia Panel: No results found for this basename: VITAMINB12,FOLATE,FERRITIN,TIBC,IRON,RETICCTPCT in the last 168 hours Urine Drug Screen: Drugs of Abuse     Component Value Date/Time   LABOPIA NONE DETECTED 08/14/2011 1711   LABOPIA NEGATIVE 08/14/2011 1711   COCAINSCRNUR NEGATIVE 08/14/2011 1711   COCAINSCRNUR NONE DETECTED 08/14/2011 1711   LABBENZ NEGATIVE 08/14/2011 1711   LABBENZ NONE DETECTED 08/14/2011 1711   AMPHETMU NONE DETECTED 08/14/2011 1711   AMPHETMU NEGATIVE 08/14/2011 1711   THCU POSITIVE* 08/14/2011 1711   LABBARB NONE DETECTED 08/14/2011 1711    Alcohol Level:  Lab 12/09/11 2032  ETH 156*   Urinalysis: No results found for this basename: COLORURINE:2,APPERANCEUR:2,LABSPEC:2,PHURINE:2,GLUCOSEU:2,HGBUR:2,BILIRUBINUR:2,KETONESUR:2,PROTEINUR:2,UROBILINOGEN:2,NITRITE:2,LEUKOCYTESUR:2 in the last 168 hours   Micro Results: No results found for this or any previous visit (from the past 240 hour(s)). Studies/Results: No results found.  Scheduled Meds:    . carbamazepine  200 mg  Oral QID  . levETIRAcetam  500 mg Oral BID  . nicotine  21 mg Transdermal Daily  . pantoprazole (PROTONIX) IV  40 mg Intravenous QHS   Continuous Infusions:    . dextrose 5 % and 0.9 % NaCl with KCl 20 mEq/L 100 mL/hr at 12/11/11 1133   PRN Meds:.HYDROmorphone (DILAUDID) injection, DISCONTD:  HYDROmorphone (DILAUDID) injection Assessment/Plan: Principal Problem:  *Acute alcoholic pancreatitis Active Problems:  Alcohol  abuse, continuous  Tobacco abuse  Seizure disorder  Macrocytosis  Start clear liquid diet. No evidence of DTs currently.   LOS: 2 days   Marishka Rentfrow L 12/11/2011, 3:54 PM

## 2011-12-12 LAB — CARBAMAZEPINE, FREE AND TOTAL
Carbamazepine Metabolite: 1.3 ug/mL — ABNORMAL HIGH (ref 0.1–1.0)
Carbamazepine, Free: 1.1 ug/mL (ref 1.0–3.0)
Carbamazepine, Total: 4.8 ug/mL (ref 4.0–12.0)

## 2011-12-12 MED ORDER — HYDROMORPHONE HCL PF 1 MG/ML IJ SOLN
1.0000 mg | INTRAMUSCULAR | Status: DC | PRN
  Administered 2011-12-12 – 2011-12-13 (×7): 2 mg via INTRAVENOUS
  Filled 2011-12-12 (×7): qty 2

## 2011-12-12 MED ORDER — SODIUM CHLORIDE 0.9 % IJ SOLN
INTRAMUSCULAR | Status: AC
  Administered 2011-12-12: 17:00:00
  Filled 2011-12-12: qty 3

## 2011-12-12 MED ORDER — POLYETHYLENE GLYCOL 3350 17 G PO PACK
17.0000 g | PACK | Freq: Every day | ORAL | Status: DC
  Administered 2011-12-12 – 2011-12-13 (×2): 17 g via ORAL
  Filled 2011-12-12 (×2): qty 1

## 2011-12-12 NOTE — Progress Notes (Signed)
Met with patient and discussed his etoh use/abuse. He denies drinking heavily and/or daily, stating her went to the beach and was drinking beer "like anybody would do looking out over the ocean". We discussed the negative influence etoh is having on his health and specifically with his pancreatitis  and he does acknowledge this as a concern. He states he plans to stop drinking as he knows "if I don't it will kill me." He declines interest in AA support groups or any other inpt or outpt services stating" its embarrassing" and he doesn't want to go sit in meetings and talk. Encouraged him to think about this- while we were talking his GF called on phone and he shared with her the conversation he and I were having- it sounded as if she was also encouraging him to consider meetings,etc. I will plan to f/u again tomorrow with him to further assess and provided services and interventions as he will accept.  He denies any depression, SI,HI, or drug use and states he stopped etoh use once before on his own- Reece Levy, MSW, [place] [phone]

## 2011-12-12 NOTE — Progress Notes (Signed)
Continues to c/o of abdominal pain. Had small bm today and tolerated a regular diet for supper

## 2011-12-12 NOTE — Progress Notes (Signed)
Subjective Tolerating clear liquids. Having some lower abdominal pressure. Hasn't had a bowel movement. Passing flatus. No vomiting. Objective: Vital signs in last 24 hours: Filed Vitals:   12/11/11 0554 12/11/11 1522 12/11/11 2118 12/12/11 0603  BP: 133/88 116/72 136/86 122/74  Pulse: 82 90 101 91  Temp: 98.1 F (36.7 C) 97.9 F (36.6 C) 98.8 F (37.1 C) 97.4 F (36.3 C)  TempSrc: Oral   Oral  Resp: 20 20 16 16   Height:      Weight: 56.7 kg (125 lb)     SpO2: 94% 91% 90% 90%   Weight change:   Intake/Output Summary (Last 24 hours) at 12/12/11 1248 Last data filed at 12/12/11 0800  Gross per 24 hour  Intake   2240 ml  Output   2500 ml  Net   -260 ml   Physical Exam: Eating a clear liquid tray. Exam unchanged from 12/11/2011.  Lab Results: Basic Metabolic Panel:  Lab 12/10/11 8295 12/09/11 2032  NA 136 125*  K 3.5 3.3*  CL 100 87*  CO2 23 24  GLUCOSE 101* 77  BUN 3* <6*  CREATININE 0.40* 0.34*  CALCIUM 8.7 9.1  MG 1.7 --  PHOS -- --   Liver Function Tests:  Lab 12/10/11 0513 12/09/11 2032  AST 24 30  ALT 10 12  ALKPHOS 86 107  BILITOT 0.3 0.2*  PROT 6.0 7.4  ALBUMIN 3.2* 3.9    Lab 12/10/11 0513 12/09/11 2032  LIPASE 58 101*  AMYLASE -- --   No results found for this basename: AMMONIA:2 in the last 168 hours CBC:  Lab 12/10/11 0513 12/09/11 2032  WBC 8.0 8.6  NEUTROABS 5.9 --  HGB 13.1 14.5  HCT 37.7* 40.4  MCV 113.6* 111.9*  PLT 217 221   Cardiac Enzymes: No results found for this basename: CKTOTAL:3,CKMB:3,CKMBINDEX:3,TROPONINI:3 in the last 168 hours BNP: No results found for this basename: PROBNP:3 in the last 168 hours D-Dimer: No results found for this basename: DDIMER:2 in the last 168 hours CBG: No results found for this basename: GLUCAP:6 in the last 168 hours Hemoglobin A1C: No results found for this basename: HGBA1C in the last 168 hours Fasting Lipid Panel: No results found for this basename:  CHOL,HDL,LDLCALC,TRIG,CHOLHDL,LDLDIRECT in the last 621 hours Thyroid Function Tests: No results found for this basename: TSH,T4TOTAL,FREET4,T3FREE,THYROIDAB in the last 168 hours Coagulation: No results found for this basename: LABPROT:4,INR:4 in the last 168 hours Anemia Panel: No results found for this basename: VITAMINB12,FOLATE,FERRITIN,TIBC,IRON,RETICCTPCT in the last 168 hours Urine Drug Screen: Drugs of Abuse     Component Value Date/Time   LABOPIA NONE DETECTED 08/14/2011 1711   LABOPIA NEGATIVE 08/14/2011 1711   COCAINSCRNUR NEGATIVE 08/14/2011 1711   COCAINSCRNUR NONE DETECTED 08/14/2011 1711   LABBENZ NEGATIVE 08/14/2011 1711   LABBENZ NONE DETECTED 08/14/2011 1711   AMPHETMU NONE DETECTED 08/14/2011 1711   AMPHETMU NEGATIVE 08/14/2011 1711   THCU POSITIVE* 08/14/2011 1711   LABBARB NONE DETECTED 08/14/2011 1711    Alcohol Level:  Lab 12/09/11 2032  ETH 156*   Urinalysis: No results found for this basename: COLORURINE:2,APPERANCEUR:2,LABSPEC:2,PHURINE:2,GLUCOSEU:2,HGBUR:2,BILIRUBINUR:2,KETONESUR:2,PROTEINUR:2,UROBILINOGEN:2,NITRITE:2,LEUKOCYTESUR:2 in the last 168 hours   Micro Results: No results found for this or any previous visit (from the past 240 hour(s)). Studies/Results: No results found.  Scheduled Meds:    . carbamazepine  200 mg Oral QID  . levETIRAcetam  500 mg Oral BID  . nicotine  21 mg Transdermal Daily  . pantoprazole (PROTONIX) IV  40 mg Intravenous QHS  . polyethylene  glycol  17 g Oral Daily   Continuous Infusions:    . DISCONTD: dextrose 5 % and 0.9 % NaCl with KCl 20 mEq/L 1,000 mL (12/12/11 1007)   PRN Meds:.HYDROmorphone (DILAUDID) injection, DISCONTD:  HYDROmorphone (DILAUDID) injection Assessment/Plan: Principal Problem:  *Acute alcoholic pancreatitis Active Problems:  Alcohol abuse, continuous  Tobacco abuse  Seizure disorder  Macrocytosis  constipation  Advance diet to low fat solid. Laxatives. Discontinue IV fluid.  Home tomorrow if stable. Social work consult for continued alcohol abuse   LOS: 3 days   Alazia Crocket L 12/12/2011, 12:48 PM

## 2011-12-13 LAB — BASIC METABOLIC PANEL
Calcium: 9.9 mg/dL (ref 8.4–10.5)
GFR calc Af Amer: >90 mL/min (ref >90)
GFR calc non Af Amer: >90 mL/min (ref >90)
Sodium: 131 mEq/L — ABNORMAL LOW (ref 135–145)

## 2011-12-13 MED ORDER — HYDROMORPHONE HCL 2 MG PO TABS: 2.0000 mg | ORAL_TABLET | ORAL | Status: AC | PRN

## 2011-12-13 NOTE — Progress Notes (Signed)
Patient discharged home with family.

## 2011-12-13 NOTE — Discharge Summary (Signed)
Physician Discharge Summary  Patient ID: PRANEETH BUSSEY MRN: [id number] DOB/AGE: Nov 24, 1975 36 y.o.  Admit date: 12/09/2011 Discharge date: 12/13/2011  Discharge Diagnoses:  Principal Problem:  *Acute alcoholic pancreatitis Active Problems:  Alcohol abuse, continuous  Tobacco abuse  Seizure disorder  Macrocytosis   Medication List  As of 12/13/2011  9:23 AM   TAKE these medications         carbamazepine 200 MG tablet   Commonly known as: TEGRETOL   Take by mouth 5 (five) times daily.      HYDROmorphone 2 MG tablet   Commonly known as: DILAUDID   Take 1 tablet (2 mg total) by mouth every 4 (four) hours as needed for pain.      levETIRAcetam 500 MG tablet   Commonly known as: KEPPRA   Take 500 mg by mouth 2 (two) times daily.      oxyCODONE-acetaminophen 5-500 MG per capsule   Commonly known as: TYLOX   Take 2 capsules by mouth every 6 (six) hours as needed. For pain            Discharge Orders    Future Orders Please Complete By Expires   Discharge instructions      Comments:   Abstain from alcohol. Low fat diet until pain has resolved.   Activity as tolerated - No restrictions         Follow-up Information    PCP         Disposition: Home or Self Care  Discharged Condition:  Stable  Consults:  social work  Labs:   Results for orders placed during the hospital encounter of 12/09/11 (from the past 48 hour(s))  BASIC METABOLIC PANEL     Status: Abnormal   Collection Time   12/13/11  5:37 AM      Component Value Range Comment   Sodium 131 (*) 135 - 145 (mEq/L)    Potassium 4.0  3.5 - 5.1 (mEq/L)    Chloride 93 (*) 96 - 112 (mEq/L)    CO2 28  19 - 32 (mEq/L)    Glucose, Bld 122 (*) 70 - 99 (mg/dL)    BUN <3 (*) 6 - 23 (mg/dL)    Creatinine, Ser 4.09 (*) 0.50 - 1.35 (mg/dL)    Calcium 9.9  8.4 - 10.5 (mg/dL)    GFR calc non Af Amer >90  >90 (mL/min)    GFR calc Af Amer >90  >90 (mL/min)     Diagnostics:  No results  found.  Procedures: None  Hospital Course:  See H&P for admission details. This is one of many hospitalizations for this gentleman who continues to abuse alcohol. He has a history of alcoholic pancreatitis. He presents after an alcohol binge with epigastric pain. He had nausea but no vomiting. In the emergency room, he had a blood pressure 158/101, but otherwise unremarkable vital signs. His abdomen was soft, nondistended with epigastric tenderness and hypoactive bowel sounds. He had a lipase of 101. He was admitted for IV fluids, bowel rest, pain and nausea control. By the time of discharge, he was tolerating a solid diet. He continued to complain of pain and will get a prescription for Dilaudid, 2 mg every 4 hours as needed for pain, 20 were dispensed. He has been counseled to quit drinking but reports again that he will "do it on his own" he declines referral to a treatment center. He had no evidence of DTs during the hospitalization.  Discharge Exam:  Blood pressure 109/73,  pulse 77, temperature 97.7 F (36.5 C), temperature source Oral, resp. rate 18, height 5\' 8"  (1.727 m), weight 56.7 kg (125 lb), SpO2 90.00%.  Unchanged from 12/12/2011   Signed: Crista Curb L 12/13/2011, 9:23 AM

## 2012-01-15 ENCOUNTER — Ambulatory Visit (HOSPITAL_COMMUNITY)
Admission: RE | Admit: 2012-01-15 | Discharge: 2012-01-15 | Disposition: A | Discharge status: Home / Self Care | Payer: Medicaid Other | Source: Ambulatory Visit | Attending: Family Medicine | Admitting: Family Medicine

## 2012-01-15 ENCOUNTER — Other Ambulatory Visit (HOSPITAL_COMMUNITY): Payer: Self-pay | Admitting: Family Medicine

## 2012-01-15 DIAGNOSIS — M545 Low back pain, unspecified: Secondary | ICD-10-CM | POA: Insufficient documentation

## 2012-01-15 DIAGNOSIS — Z09 Encounter for follow-up examination after completed treatment for conditions other than malignant neoplasm: Secondary | ICD-10-CM

## 2012-02-15 ENCOUNTER — Encounter (HOSPITAL_COMMUNITY): Payer: Self-pay | Admitting: *Deleted

## 2012-02-15 ENCOUNTER — Emergency Department (HOSPITAL_COMMUNITY)
Admission: EM | Admit: 2012-02-15 | Discharge: 2012-02-15 | Disposition: A | Discharge status: Home / Self Care | Payer: Medicaid Other | Attending: Emergency Medicine | Admitting: Emergency Medicine

## 2012-02-15 ENCOUNTER — Inpatient Hospital Stay (HOSPITAL_COMMUNITY)
Admission: EM | Admit: 2012-02-15 | Discharge: 2012-02-19 | DRG: 439 | Disposition: A | Discharge status: Home / Self Care | Payer: Medicaid Other | Attending: Family Medicine | Admitting: Family Medicine

## 2012-02-15 ENCOUNTER — Emergency Department (HOSPITAL_COMMUNITY): Payer: Medicaid Other

## 2012-02-15 DIAGNOSIS — J449 Chronic obstructive pulmonary disease, unspecified: Secondary | ICD-10-CM | POA: Diagnosis present

## 2012-02-15 DIAGNOSIS — Z79899 Other long term (current) drug therapy: Secondary | ICD-10-CM | POA: Insufficient documentation

## 2012-02-15 DIAGNOSIS — F1010 Alcohol abuse, uncomplicated: Secondary | ICD-10-CM | POA: Diagnosis present

## 2012-02-15 DIAGNOSIS — G40909 Epilepsy, unspecified, not intractable, without status epilepticus: Secondary | ICD-10-CM | POA: Diagnosis present

## 2012-02-15 DIAGNOSIS — F10239 Alcohol dependence with withdrawal, unspecified: Secondary | ICD-10-CM | POA: Diagnosis present

## 2012-02-15 DIAGNOSIS — K852 Alcohol induced acute pancreatitis without necrosis or infection: Secondary | ICD-10-CM | POA: Diagnosis present

## 2012-02-15 DIAGNOSIS — F17200 Nicotine dependence, unspecified, uncomplicated: Secondary | ICD-10-CM | POA: Diagnosis present

## 2012-02-15 DIAGNOSIS — K859 Acute pancreatitis without necrosis or infection, unspecified: Secondary | ICD-10-CM

## 2012-02-15 DIAGNOSIS — D7589 Other specified diseases of blood and blood-forming organs: Secondary | ICD-10-CM | POA: Diagnosis present

## 2012-02-15 DIAGNOSIS — R1013 Epigastric pain: Secondary | ICD-10-CM | POA: Diagnosis present

## 2012-02-15 DIAGNOSIS — F1020 Alcohol dependence, uncomplicated: Secondary | ICD-10-CM | POA: Diagnosis present

## 2012-02-15 DIAGNOSIS — Z720 Tobacco use: Secondary | ICD-10-CM | POA: Diagnosis present

## 2012-02-15 DIAGNOSIS — F10939 Alcohol use, unspecified with withdrawal, unspecified: Secondary | ICD-10-CM | POA: Diagnosis present

## 2012-02-15 DIAGNOSIS — R109 Unspecified abdominal pain: Secondary | ICD-10-CM | POA: Insufficient documentation

## 2012-02-15 DIAGNOSIS — J441 Chronic obstructive pulmonary disease with (acute) exacerbation: Secondary | ICD-10-CM | POA: Diagnosis present

## 2012-02-15 DIAGNOSIS — J4489 Other specified chronic obstructive pulmonary disease: Secondary | ICD-10-CM | POA: Insufficient documentation

## 2012-02-15 LAB — DIFFERENTIAL
Eosinophils Absolute: 0.1 10*3/uL (ref 0.0–0.7)
Lymphocytes Relative: 24 % (ref 12–46)
Lymphs Abs: 1.7 10*3/uL (ref 0.7–4.0)
Neutro Abs: 4.6 10*3/uL (ref 1.7–7.7)
Neutrophils Relative %: 64 % (ref 43–77)

## 2012-02-15 LAB — COMPREHENSIVE METABOLIC PANEL
ALT: 7 U/L (ref 0–53)
Alkaline Phosphatase: 93 U/L (ref 39–117)
Glucose, Bld: 97 mg/dL (ref 70–99)
Potassium: 4 mEq/L (ref 3.5–5.1)
Sodium: 137 mEq/L (ref 135–145)
Total Protein: 6.8 g/dL (ref 6.0–8.3)

## 2012-02-15 LAB — CBC
MCH: 37.3 pg — ABNORMAL HIGH (ref 26.0–34.0)
Platelets: 194 10*3/uL (ref 150–400)
RBC: 3.67 MIL/uL — ABNORMAL LOW (ref 4.22–5.81)
WBC: 7.1 10*3/uL (ref 4.0–10.5)

## 2012-02-15 LAB — LIPASE, BLOOD: Lipase: 594 U/L — ABNORMAL HIGH (ref 11–59)

## 2012-02-15 MED ORDER — HYDROMORPHONE HCL PF 1 MG/ML IJ SOLN
1.0000 mg | INTRAMUSCULAR | Status: DC | PRN
  Administered 2012-02-15: 1 mg via INTRAVENOUS
  Filled 2012-02-15: qty 1

## 2012-02-15 MED ORDER — HYDROCODONE-ACETAMINOPHEN 5-325 MG PO TABS: 1.0000 | ORAL_TABLET | Freq: Four times a day (QID) | ORAL | Status: AC | PRN

## 2012-02-15 MED ORDER — HYDROMORPHONE HCL PF 1 MG/ML IJ SOLN
1.0000 mg | Freq: Once | INTRAMUSCULAR | Status: AC
  Administered 2012-02-15: 1 mg via INTRAVENOUS
  Filled 2012-02-15: qty 1

## 2012-02-15 MED ORDER — ONDANSETRON HCL 4 MG/2ML IJ SOLN
4.0000 mg | Freq: Once | INTRAMUSCULAR | Status: AC
  Administered 2012-02-15: 4 mg via INTRAVENOUS
  Filled 2012-02-15: qty 2

## 2012-02-15 MED ORDER — HYDROMORPHONE HCL PF 2 MG/ML IJ SOLN
2.0000 mg | Freq: Once | INTRAMUSCULAR | Status: AC
  Administered 2012-02-15: 2 mg via INTRAMUSCULAR
  Filled 2012-02-15: qty 1

## 2012-02-15 MED ORDER — ONDANSETRON HCL 4 MG/2ML IJ SOLN
4.0000 mg | Freq: Three times a day (TID) | INTRAMUSCULAR | Status: DC | PRN
  Administered 2012-02-15: 4 mg via INTRAVENOUS
  Filled 2012-02-15: qty 2

## 2012-02-15 MED ORDER — SODIUM CHLORIDE 0.9 % IV BOLUS (SEPSIS)
1000.0000 mL | Freq: Once | INTRAVENOUS | Status: AC
  Administered 2012-02-15: 1000 mL via INTRAVENOUS

## 2012-02-15 MED ORDER — SODIUM CHLORIDE 0.9 % IV SOLN
INTRAVENOUS | Status: AC
  Administered 2012-02-15: via INTRAVENOUS

## 2012-02-15 NOTE — ED Provider Notes (Signed)
History     CSN: [id number]  Arrival date & time 02/15/12  2014   First MD Initiated Contact with Patient 02/15/12 2038      Chief Complaint  Patient presents with  . Abdominal Pain    (Consider location/radiation/quality/duration/timing/severity/associated sxs/prior treatment) HPI....epigastric pain for 24 hours.  Patient was in ED earlier today. Sent home. Now with persistent pain. Has history of pancreatitis. Minimal alcohol consumption recently. Pain is sharp and moderate. No radiation. Nothing makes it better or worse  Past Medical History  Diagnosis Date  . Pancreatitis   . Seizures   . Back pain   . ETOH abuse   . COPD (chronic obstructive pulmonary disease)   . SAH (subarachnoid hemorrhage)   . Macrocytosis 08/17/2011  . Bradycardia 08/17/2011    Past Surgical History  Procedure Date  . Back surgery   . Septoplasty     History reviewed. No pertinent family history.  History  Substance Use Topics  . Smoking status: Current Everyday Smoker -- 1.0 packs/day    Types: Cigarettes  . Smokeless tobacco: Not on file  . Alcohol Use: Yes     ocassionally      Review of Systems  All other systems reviewed and are negative.    Allergies  Review of patient's allergies indicates no known allergies.  Home Medications   Current Outpatient Rx  Name Route Sig Dispense Refill  . CARBAMAZEPINE 200 MG PO TABS Oral Take 200 mg by mouth 5 (five) times daily.     . IBUPROFEN 200 MG PO TABS Oral Take 400 mg by mouth every 6 (six) hours as needed. Pain    . LEVETIRACETAM 500 MG PO TABS Oral Take 500 mg by mouth 2 (two) times daily.     . OXYCODONE-ACETAMINOPHEN 5-500 MG PO CAPS Oral Take 2 capsules by mouth every 6 (six) hours as needed. For pain      BP 173/94  Pulse 87  Temp 97.5 F (36.4 C)  Resp 20  Ht 5\' 8"  (1.727 m)  Wt 118 lb (53.524 kg)  BMI 17.94 kg/m2  SpO2 100%  Physical Exam  Nursing note and vitals reviewed. Constitutional: He is oriented to  person, place, and time. He appears well-developed and well-nourished.  HENT:  Head: Normocephalic and atraumatic.  Eyes: Conjunctivae and EOM are normal. Pupils are equal, round, and reactive to light.  Neck: Normal range of motion. Neck supple.  Cardiovascular: Normal rate and regular rhythm.   Pulmonary/Chest: Effort normal and breath sounds normal.  Abdominal: Bowel sounds are normal.       Slight epigastric tenderness  Musculoskeletal: Normal range of motion.  Neurological: He is alert and oriented to person, place, and time.  Skin: Skin is warm and dry.  Psychiatric: He has a normal mood and affect.    ED Course  Procedures (including critical care time)  Labs Reviewed - No data to display No results found.   No diagnosis found.    MDM  Will hydrate patient, treat pain, check CBC, chemistry panel, lipase.  2200:  Status with Dr. Estell Harpin.  Will assume care      Donnetta Hutching, MD 02/15/12 2154

## 2012-02-15 NOTE — ED Provider Notes (Signed)
History  This chart was scribed for Joya Gaskins, MD by Cherlynn Perches. The patient was seen in room APA11/[facility]11. Patient's care was started at 0927.  CSN: [id number]  Arrival date & time 02/15/12  4098   First MD Initiated Contact with Patient 02/15/12 0930      Chief Complaint  Patient presents with  . Abdominal Pain    HPI  [name] is a 36 y.o. male with a h/o pancreatitis who presents to the Emergency Department complaining of 12 hours of sudden onset, unchanging moderate abdominal pain localized to the upper abdomen similar to that previously experienced with pancreatitis. Pt reports taking ibuprofen without much relief. No other modifying factors were noted. Pt states that he has never had surgery on his abdomen. Pt also reports having back pain from back surgery and coughing due to smoking.  Pt denies nausea, vomiting, diarrhea, fever, bloody stools, melena and chest congestion. Pt is a current everyday smoker and denies any recent alcohol use. Pt's PCP is Dr. Delbert Harness and pt's neurologist is Dr. Channing Mutters. Denies recent ETOH use  Past Medical History  Diagnosis Date  . Pancreatitis   . Seizures   . Back pain   . ETOH abuse   . COPD (chronic obstructive pulmonary disease)   . SAH (subarachnoid hemorrhage)   . Macrocytosis 08/17/2011  . Bradycardia 08/17/2011    Past Surgical History  Procedure Date  . Back surgery   . Septoplasty     No family history on file.  History  Substance Use Topics  . Smoking status: Current Everyday Smoker -- 1.0 packs/day    Types: Cigarettes  . Smokeless tobacco: Not on file  . Alcohol Use: Yes     ocassionally      Review of Systems A complete 10 system review of systems was obtained and all systems are negative except as noted in the HPI and PMH.    Allergies  Review of patient's allergies indicates no known allergies.  Home Medications   Current Outpatient Rx  Name Route Sig Dispense Refill  . CARBAMAZEPINE  200 MG PO TABS Oral Take by mouth 5 (five) times daily.     Marland Kitchen LEVETIRACETAM 500 MG PO TABS Oral Take 500 mg by mouth 2 (two) times daily.     . OXYCODONE-ACETAMINOPHEN 5-500 MG PO CAPS Oral Take 2 capsules by mouth every 6 (six) hours as needed. For pain      BP 190/105  Pulse 96  Temp(Src) 98.7 F (37.1 C) (Oral)  Resp 16  Ht 5\' 8"  (1.727 m)  Wt 118 lb (53.524 kg)  BMI 17.94 kg/m2  SpO2 97%  Physical Exam CONSTITUTIONAL: Well developed/well nourished HEAD AND FACE: Normocephalic/atraumatic EYES: EOMI/PERRL, no scleral icterus ENMT: Mucous membranes moist NECK: supple no meningeal signs SPINE:entire spine nontender CV: S1/S2 noted, no murmurs/rubs/gallops noted LUNGS: Lungs are clear to auscultation bilaterally, no apparent distress ABDOMEN: soft,  Moderate epigastric tenderness , no rebound or guarding, bowel sounds normal GU:no cva tenderness NEURO: Pt is awake/alert, moves all extremitiesx4 EXTREMITIES: pulses normal, full ROM SKIN: warm, color normal PSYCH: no abnormalities of mood noted   ED Course  Procedures  DIAGNOSTIC STUDIES: Oxygen Saturation is 97% on room air, adequate by my interpretation.    COORDINATION OF CARE: 9:40AM - Patient understands and agrees with initial ED impression and plan with expectations set for ED visit.  Pt improved in the ED, sitting up and appears comfortable after pain meds No vomiting Afebrile Reports  very similar to prior episodes of pancreatitis I doubt acute abd process He denies cp/sob Denies recent symptoms of GI bleed Advised f/u as outpatient  The patient appears reasonably screened and/or stabilized for discharge and I doubt any other medical condition or other [facility] requiring further screening, evaluation, or treatment in the ED at this time prior to discharge.     MDM  Nursing notes reviewed and considered in documentation Previous records reviewed and considered Narcotic database reviewed      I personally  performed the services described in this documentation, which was scribed in my presence. The recorded information has been reviewed and considered.      Joya Gaskins, MD 02/15/12 1030

## 2012-02-15 NOTE — Discharge Instructions (Signed)

## 2012-02-15 NOTE — ED Notes (Signed)
Pt states upper abdominal pain began last night. "My pancreatitis is acting up" per pt. Denies nausea or any other symptoms at this time. Unable to describe pain, "Just hurts"

## 2012-02-15 NOTE — H&P (Signed)
PCP:   Debbe Mounts M.D.   Chief Complaint:  Abdominal pain since yesterday  HPI: [name] is an 36 y.o. male.   History of tobacco and alcohol abuse, recurrent episodes of pancreatitis associated with alcohol abuse, admits to drinking beer episodically, last use 3 beers yesterday, has been having severe abdominal pain since yesterday not relieved by home meds including ibuprofen. Came to the emergency room this morning and was improved with pain medications and discharged home. Returns this evening with worsening pain, anorexia and nausea, no vomiting or diarrheal. Blood work shows markedly elevated lipase.  Patient is sexually active. Had ongoing tobacco abuse episodic wheezing, uses his girlfriends bronchodilators.  Rewiew of Systems:  The patient denies a, fever, weight loss,, vision loss, decreased hearing, hoarseness, chest pain, syncope, dyspnea on exertion, peripheral edema, balance deficits, hemoptysis, melena, hematochezia, severe indigestion/heartburn, hematuria, incontinence, genital sores, muscle weakness, suspicious skin lesions, transient blindness, difficulty walking, depression, unusual weight change, abnormal bleeding, enlarged lymph nodes, angioedema, and breast masses.    Past Medical History  Diagnosis Date  . Pancreatitis   . Seizures   . Back pain   . ETOH abuse   . COPD (chronic obstructive pulmonary disease)   . SAH (subarachnoid hemorrhage)   . Macrocytosis 08/17/2011  . Bradycardia 08/17/2011    Past Surgical History  Procedure Date  . Back surgery   . Septoplasty     Medications:  HOME MEDS: Prior to Admission medications   Medication Sig Start Date End Date Taking? Authorizing Provider  carbamazepine (TEGRETOL) 200 MG tablet Take 200 mg by mouth 5 (five) times daily.    Yes Historical Provider, MD  ibuprofen (ADVIL,MOTRIN) 200 MG tablet Take 400 mg by mouth every 6 (six) hours as needed. Pain   Yes Historical Provider, MD    levETIRAcetam (KEPPRA) 500 MG tablet Take 500 mg by mouth 2 (two) times daily.    Yes Historical Provider, MD  oxyCODONE-acetaminophen (TYLOX) 5-500 MG per capsule Take 2 capsules by mouth every 6 (six) hours as needed. For pain   Yes Historical Provider, MD  HYDROcodone-acetaminophen (NORCO) 5-325 MG per tablet Take 1 tablet by mouth every 6 (six) hours as needed for pain. 02/15/12 02/25/12  Benny Lennert, MD     Allergies:  No Known Allergies  Social History:   reports that he has been smoking Cigarettes.  He has been smoking about 1 pack per day. He does not have any smokeless tobacco history on file. He reports that he drinks alcohol. He reports that he does not use illicit drugs.  Family History: Family History  Problem Relation Age of Onset  . Seizures Father   . Alcohol abuse Father   . Coronary artery disease Mother     deceased age 36     Physical Exam: Filed Vitals:   02/15/12 2034  BP: 173/94  Pulse: 87  Temp: 97.5 F (36.4 C)  Resp: 20  Height: 5\' 8"  (1.727 m)  Weight: 53.524 kg (118 lb)  SpO2: 100%   Blood pressure 173/94, pulse 87, temperature 97.5 F (36.4 C), resp. rate 20, height 5\' 8"  (1.727 m), weight 53.524 kg (118 lb), SpO2 100.00%.  GEN:  Apprehensive young Caucasian man lying in the stretcher; cooperative with exam PSYCH:  alert and oriented x4; does  appear anxious; affect is appropriate. HEENT: Mucous membranes pink, dry and anicteric; PERRLA; EOM intact; no cervical lymphadenopathy nor thyromegaly or carotid bruit; bilateral nontender epitrochlear lymph nodes; Breasts:: Not examined  CHEST WALL: No tenderness CHEST: Normal respiration, bilateral coarse rhonchi HEART: Regular rate and rhythm; no murmurs rubs or gallops BACK: No kyphosis or scoliosis; no CVA tenderness ABDOMEN: Scaphoid diffuse mild tenderness; , normal abdominal bowel sounds;  Rectal Exam: Not done EXTREMITIES: No bone or joint deformity; age-appropriate arthropathy of the  hands and knees; no edema; no ulcerations. Genitalia: not examined PULSES: 2+ and symmetric SKIN: Normal hydration no rash or ulceration CNS: Cranial nerves 2-12 grossly intact no focal lateralizing neurologic deficit   Labs & Imaging Results for orders placed during the hospital encounter of 02/15/12 (from the past 48 hour(s))  CBC     Status: Abnormal   Collection Time   02/15/12  9:49 PM      Component Value Range Comment   WBC 7.1  4.0 - 10.5 (K/uL)    RBC 3.67 (*) 4.22 - 5.81 (MIL/uL)    Hemoglobin 13.7  13.0 - 17.0 (g/dL)    HCT 16.1  09.6 - 04.5 (%)    MCV 110.9 (*) 78.0 - 100.0 (fL)    MCH 37.3 (*) 26.0 - 34.0 (pg)    MCHC 33.7  30.0 - 36.0 (g/dL)    RDW 40.9  81.1 - 91.4 (%)    Platelets 194  150 - 400 (K/uL)   DIFFERENTIAL     Status: Normal   Collection Time   02/15/12  9:49 PM      Component Value Range Comment   Neutrophils Relative 64  43 - 77 (%)    Neutro Abs 4.6  1.7 - 7.7 (K/uL)    Lymphocytes Relative 24  12 - 46 (%)    Lymphs Abs 1.7  0.7 - 4.0 (K/uL)    Monocytes Relative 10  3 - 12 (%)    Monocytes Absolute 0.7  0.1 - 1.0 (K/uL)    Eosinophils Relative 2  0 - 5 (%)    Eosinophils Absolute 0.1  0.0 - 0.7 (K/uL)    Basophils Relative 0  0 - 1 (%)    Basophils Absolute 0.0  0.0 - 0.1 (K/uL)   COMPREHENSIVE METABOLIC PANEL     Status: Abnormal   Collection Time   02/15/12  9:49 PM      Component Value Range Comment   Sodium 137  135 - 145 (mEq/L)    Potassium 4.0  3.5 - 5.1 (mEq/L)    Chloride 102  96 - 112 (mEq/L)    CO2 27  19 - 32 (mEq/L)    Glucose, Bld 97  70 - 99 (mg/dL)    BUN 7  6 - 23 (mg/dL)    Creatinine, Ser 7.82 (*) 0.50 - 1.35 (mg/dL)    Calcium 9.0  8.4 - 10.5 (mg/dL)    Total Protein 6.8  6.0 - 8.3 (g/dL)    Albumin 3.8  3.5 - 5.2 (g/dL)    AST 19  0 - 37 (U/L)    ALT 7  0 - 53 (U/L)    Alkaline Phosphatase 93  39 - 117 (U/L)    Total Bilirubin 0.4  0.3 - 1.2 (mg/dL)    GFR calc non Af Amer >90  >90 (mL/min)    GFR calc Af Amer >90   >90 (mL/min)   LIPASE, BLOOD     Status: Abnormal   Collection Time   02/15/12  9:49 PM      Component Value Range Comment   Lipase 594 (*) 11 - 59 (U/L)    Dg Abd  Acute W/chest  02/15/2012  *RADIOLOGY REPORT*  Clinical Data: Epigastric pain, history of pancreatitis  ACUTE ABDOMEN SERIES (ABDOMEN 2 VIEW & CHEST 1 VIEW)  Comparison: 09/09/2011  Findings: Lungs are clear. No pleural effusion or pneumothorax.  Cardiomediastinal silhouette is within normal limits.  Nonobstructive bowel gas pattern.  Possible wall thickening versus underdistension of the stomach.  No evidence of free air under the diaphragm on the upright view.  Visualized osseous structures are within normal limits.  IMPRESSION: No evidence of acute cardiopulmonary disease.  No evidence of small bowel obstruction or free air.  Original Report Authenticated By: Charline Bills, M.D.      Assessment Present on Admission:  .Acute alcoholic pancreatitis .Alcohol abuse, continuous .Tobacco abuse .Seizure disorder .COPD (chronic obstructive pulmonary disease) with exacerbation  .Macrocytosis likely due to chronic alcoholism   PLAN: Admit patient for hydration and pain control; Keep him n.p.o. while pain persists Nebulizer patient's for wheezing; no antibiotics for the time being Counsel on tobacco cessation and given nicotine replacement; Continue his antiseizure medication  Other plans as per orders.  Primary care physician will assume care in the morning  Jaymason Ledesma 02/16/2012, 12:12 AM

## 2012-02-15 NOTE — ED Provider Notes (Signed)
Pt to be admitted for pancreatitis  Jose Lennert, MD 02/15/12 2339

## 2012-02-15 NOTE — ED Notes (Signed)
Pt c/o upper abdominal pain. Pt states it is his pancreas. Pt states he was here earlier and pain was eased a little bit. EDP did not send pt home with pain meds because he should of had some pain meds left from regular doctor.

## 2012-02-15 NOTE — ED Notes (Signed)
Attempted to call report. Was advised nurse will call this nurse back for report.

## 2012-02-15 NOTE — ED Notes (Signed)
Pt states "his stomach still hurts like hell". But better than when he came in.

## 2012-02-15 NOTE — ED Provider Notes (Deleted)
History   This chart was scribed for Benny Lennert, MD by Melba Coon. The patient was seen in room [facility] and the patient's care was started at 10:16PM.   CSN: [id number]  Arrival date & time 02/15/12  2014   First MD Initiated Contact with Patient 02/15/12 2038      Chief Complaint  Patient presents with  . Abdominal Pain    (Consider location/radiation/quality/duration/timing/severity/associated sxs/prior treatment) Patient is a 36 y.o. male presenting with abdominal pain.  Abdominal Pain The primary symptoms of the illness include abdominal pain. The primary symptoms of the illness do not include nausea, vomiting or diarrhea. The current episode started yesterday. The onset of the illness was sudden. The problem has not changed since onset. The abdominal pain is located in the epigastric region. The abdominal pain does not radiate. The severity of the abdominal pain is 5/10. The abdominal pain is relieved by nothing. The abdominal pain is exacerbated by movement.  Associated medical issues comments: pancreatitis.  Hx of pancreatitis and chronic abd pain. Usually gets admitted; however, pt was here yesterday for similar problems and was sent home after pain was eased with no pain Rx. Pt states that EDMD from previous visit sad that pt had enough meds from regular PCP. No known allergies. No other pertinent medical symptoms.  PCP: Dr. Janna Arch  Past Medical History  Diagnosis Date  . Pancreatitis   . Seizures   . Back pain   . ETOH abuse   . COPD (chronic obstructive pulmonary disease)   . SAH (subarachnoid hemorrhage)   . Macrocytosis 08/17/2011  . Bradycardia 08/17/2011    Past Surgical History  Procedure Date  . Back surgery   . Septoplasty     History reviewed. No pertinent family history.  History  Substance Use Topics  . Smoking status: Current Everyday Smoker -- 1.0 packs/day    Types: Cigarettes  . Smokeless tobacco: Not on file  . Alcohol Use:  Yes     ocassionally      Review of Systems  Gastrointestinal: Positive for abdominal pain. Negative for nausea, vomiting and diarrhea.  10 Systems reviewed and all are negative for acute change except as noted in the HPI.    Allergies  Review of patient's allergies indicates no known allergies.  Home Medications   Current Outpatient Rx  Name Route Sig Dispense Refill  . CARBAMAZEPINE 200 MG PO TABS Oral Take 200 mg by mouth 5 (five) times daily.     . IBUPROFEN 200 MG PO TABS Oral Take 400 mg by mouth every 6 (six) hours as needed. Pain    . LEVETIRACETAM 500 MG PO TABS Oral Take 500 mg by mouth 2 (two) times daily.     . OXYCODONE-ACETAMINOPHEN 5-500 MG PO CAPS Oral Take 2 capsules by mouth every 6 (six) hours as needed. For pain      BP 173/94  Pulse 87  Temp 97.5 F (36.4 C)  Resp 20  Ht 5\' 8"  (1.727 m)  Wt 118 lb (53.524 kg)  BMI 17.94 kg/m2  SpO2 100%  Physical Exam  Nursing note and vitals reviewed. Constitutional: He is oriented to person, place, and time. He appears well-developed and well-nourished.       Awake, alert, nontoxic appearance.  HENT:  Head: Normocephalic and atraumatic.  Eyes: EOM are normal. Pupils are equal, round, and reactive to light. Right eye exhibits no discharge. Left eye exhibits no discharge.  Neck: Normal range of motion. Neck supple.  Cardiovascular: Normal rate.   No murmur heard. Pulmonary/Chest: Effort normal. He exhibits no tenderness.  Abdominal: Soft. There is tenderness (epigastric). There is no rebound.  Musculoskeletal: He exhibits no edema and no tenderness.       Baseline ROM, no obvious new focal weakness.  Neurological: He is alert and oriented to person, place, and time.       Mental status and motor strength appears baseline for patient and situation.  Skin: Skin is warm. No rash noted.  Psychiatric: He has a normal mood and affect. His behavior is normal.    ED Course  Procedures (including critical care  time) DIAGNOSTIC STUDIES: Oxygen Saturation is 100% on room air, normal by my interpretation.    COORDINATION OF CARE:  10:21PM - Blood w/u and pain meds will be ordered for the pt   Labs Reviewed  CBC - Abnormal; Notable for the following:    RBC 3.67 (*)    MCV 110.9 (*)    MCH 37.3 (*)    All other components within normal limits  COMPREHENSIVE METABOLIC PANEL - Abnormal; Notable for the following:    Creatinine, Ser 0.46 (*)    All other components within normal limits  LIPASE, BLOOD - Abnormal; Notable for the following:    Lipase 594 (*)    All other components within normal limits  DIFFERENTIAL   No results found.   No diagnosis found.    MDM  Pt with changes on ct that could be renal ca.  Pt to get ultrasound in am The chart was scribed for me under my direct supervision.  I personally performed the history, physical, and medical decision making and all procedures in the evaluation of this patient.Benny Lennert, MD 02/15/12 2249

## 2012-02-16 ENCOUNTER — Encounter (HOSPITAL_COMMUNITY): Payer: Self-pay | Admitting: Internal Medicine

## 2012-02-16 LAB — COMPREHENSIVE METABOLIC PANEL
ALT: 7 U/L (ref 0–53)
Alkaline Phosphatase: 86 U/L (ref 39–117)
CO2: 25 mEq/L (ref 19–32)
GFR calc Af Amer: >90 mL/min (ref >90)
GFR calc non Af Amer: >90 mL/min (ref >90)
Glucose, Bld: 83 mg/dL (ref 70–99)
Potassium: 3.4 mEq/L — ABNORMAL LOW (ref 3.5–5.1)
Sodium: 139 mEq/L (ref 135–145)

## 2012-02-16 LAB — URINALYSIS, ROUTINE W REFLEX MICROSCOPIC
Bilirubin Urine: NEGATIVE
Glucose, UA: NEGATIVE mg/dL
Leukocytes, UA: NEGATIVE
Nitrite: NEGATIVE
Specific Gravity, Urine: 1.01 (ref 1.005–1.030)
pH: 6 (ref 5.0–8.0)

## 2012-02-16 LAB — CBC
HCT: 40.2 % (ref 39.0–52.0)
Hemoglobin: 13.4 g/dL (ref 13.0–17.0)
MCH: 37.5 pg — ABNORMAL HIGH (ref 26.0–34.0)
RBC: 3.57 MIL/uL — ABNORMAL LOW (ref 4.22–5.81)

## 2012-02-16 MED ORDER — FOLIC ACID 5 MG/ML IJ SOLN
INTRAMUSCULAR | Status: AC
  Filled 2012-02-16: qty 0.2

## 2012-02-16 MED ORDER — ONDANSETRON HCL 4 MG PO TABS
4.0000 mg | ORAL_TABLET | Freq: Four times a day (QID) | ORAL | Status: DC | PRN
  Administered 2012-02-18: 4 mg via ORAL
  Filled 2012-02-16: qty 1

## 2012-02-16 MED ORDER — M.V.I. ADULT IV INJ
INJECTION | INTRAVENOUS | Status: AC
  Filled 2012-02-16: qty 10

## 2012-02-16 MED ORDER — NICOTINE 21 MG/24HR TD PT24
21.0000 mg | MEDICATED_PATCH | Freq: Every day | TRANSDERMAL | Status: DC
  Administered 2012-02-16 – 2012-02-18 (×4): 21 mg via TRANSDERMAL
  Filled 2012-02-16 (×4): qty 1

## 2012-02-16 MED ORDER — TRAZODONE HCL 50 MG PO TABS
25.0000 mg | ORAL_TABLET | Freq: Every evening | ORAL | Status: DC | PRN
  Administered 2012-02-17: 25 mg via ORAL
  Filled 2012-02-16: qty 1

## 2012-02-16 MED ORDER — BIOTENE DRY MOUTH MT LIQD
15.0000 mL | Freq: Two times a day (BID) | OROMUCOSAL | Status: DC
  Administered 2012-02-16 – 2012-02-17 (×3): 15 mL via OROMUCOSAL

## 2012-02-16 MED ORDER — ALBUTEROL SULFATE (5 MG/ML) 0.5% IN NEBU
2.5000 mg | INHALATION_SOLUTION | RESPIRATORY_TRACT | Status: DC
  Administered 2012-02-16 – 2012-02-17 (×7): 2.5 mg via RESPIRATORY_TRACT
  Filled 2012-02-16 (×8): qty 0.5

## 2012-02-16 MED ORDER — IPRATROPIUM BROMIDE 0.02 % IN SOLN
0.5000 mg | RESPIRATORY_TRACT | Status: DC
  Administered 2012-02-16 – 2012-02-17 (×7): 0.5 mg via RESPIRATORY_TRACT
  Filled 2012-02-16 (×8): qty 2.5

## 2012-02-16 MED ORDER — LEVETIRACETAM 500 MG PO TABS
500.0000 mg | ORAL_TABLET | Freq: Two times a day (BID) | ORAL | Status: DC
  Administered 2012-02-16 – 2012-02-18 (×6): 500 mg via ORAL
  Filled 2012-02-16 (×6): qty 1

## 2012-02-16 MED ORDER — HYDROMORPHONE HCL PF 1 MG/ML IJ SOLN
1.0000 mg | INTRAMUSCULAR | Status: DC | PRN
  Administered 2012-02-16 – 2012-02-18 (×25): 2 mg via INTRAVENOUS
  Administered 2012-02-18: 1 mg via INTRAVENOUS
  Administered 2012-02-18 (×2): 2 mg via INTRAVENOUS
  Administered 2012-02-18: 1 mg via INTRAVENOUS
  Administered 2012-02-18 (×2): 2 mg via INTRAVENOUS
  Administered 2012-02-18: 1 mg via INTRAVENOUS
  Administered 2012-02-18 – 2012-02-19 (×6): 2 mg via INTRAVENOUS
  Filled 2012-02-16 (×11): qty 2
  Filled 2012-02-16: qty 1
  Filled 2012-02-16 (×15): qty 2
  Filled 2012-02-16: qty 1
  Filled 2012-02-16 (×11): qty 2

## 2012-02-16 MED ORDER — POTASSIUM CHLORIDE CRYS ER 20 MEQ PO TBCR
20.0000 meq | EXTENDED_RELEASE_TABLET | Freq: Every day | ORAL | Status: AC
  Administered 2012-02-16 – 2012-02-17 (×2): 20 meq via ORAL
  Filled 2012-02-16 (×2): qty 1

## 2012-02-16 MED ORDER — CARBAMAZEPINE 200 MG PO TABS
200.0000 mg | ORAL_TABLET | Freq: Every day | ORAL | Status: DC
  Administered 2012-02-16 – 2012-02-19 (×16): 200 mg via ORAL
  Filled 2012-02-16 (×16): qty 1

## 2012-02-16 MED ORDER — PNEUMOCOCCAL VAC POLYVALENT 25 MCG/0.5ML IJ INJ
0.5000 mL | INJECTION | INTRAMUSCULAR | Status: AC
  Administered 2012-02-16: 0.5 mL via INTRAMUSCULAR
  Filled 2012-02-16: qty 0.5

## 2012-02-16 MED ORDER — THIAMINE HCL 100 MG/ML IJ SOLN
INTRAMUSCULAR | Status: AC
  Filled 2012-02-16: qty 2

## 2012-02-16 MED ORDER — THIAMINE HCL 100 MG/ML IJ SOLN
Freq: Once | INTRAVENOUS | Status: AC
  Administered 2012-02-16: 02:00:00 via INTRAVENOUS
  Filled 2012-02-16: qty 1000

## 2012-02-16 MED ORDER — ONDANSETRON HCL 4 MG/2ML IJ SOLN
4.0000 mg | INTRAMUSCULAR | Status: DC | PRN
  Administered 2012-02-17: 4 mg via INTRAVENOUS
  Filled 2012-02-16: qty 2

## 2012-02-16 MED ORDER — ENOXAPARIN SODIUM 40 MG/0.4ML ~~LOC~~ SOLN
40.0000 mg | Freq: Every day | SUBCUTANEOUS | Status: DC
  Administered 2012-02-16 – 2012-02-18 (×3): 40 mg via SUBCUTANEOUS
  Filled 2012-02-16 (×3): qty 0.4

## 2012-02-16 MED ORDER — POTASSIUM CHLORIDE IN NACL 20-0.9 MEQ/L-% IV SOLN
INTRAVENOUS | Status: DC
  Administered 2012-02-16 (×2): via INTRAVENOUS
  Administered 2012-02-17: 1000 mL via INTRAVENOUS
  Administered 2012-02-17 (×2): via INTRAVENOUS
  Administered 2012-02-18: 1000 mL via INTRAVENOUS
  Administered 2012-02-18: 14:00:00 via INTRAVENOUS
  Administered 2012-02-18: 1000 mL via INTRAVENOUS
  Administered 2012-02-18 – 2012-02-19 (×2): via INTRAVENOUS

## 2012-02-16 NOTE — Progress Notes (Signed)
[id number] 

## 2012-02-16 NOTE — Progress Notes (Signed)
[name], PETRELLI NO.:  [id number]  MEDICAL RECORD NO.:  [id number]  LOCATION:  A213                          FACILITY:  [facility]  PHYSICIAN:  Melvyn Novas, MDDATE OF BIRTH:  [birth year]  DATE OF PROCEDURE: DATE OF DISCHARGE:                                PROGRESS NOTE   The patient has ethanol pancreatitis yesterday, admitted last night, lipase was 594 with epigastric pain.  He is drinking three 40-ounce beers a day.  There is no liquor on top.  He denies any other substance abuse except cigarettes.  Currently has nicotine patch in place.  Other lab abnormalities include a potassium of 3.4, total protein is within normal limits, hemoglobin 13.4.  He has seizure disorder, currently on Keppra.  VITAL SIGNS:  Blood pressure 162/89, pulse is 58 and regular, respiratory rate is 22, O2 sat is 95%. LUNGS:  Scattered rhonchi.  Prolonged expiratory phase.  Diminished breath sounds at bases.  No rales appreciable. HEART:  Regular rhythm.  No S3 or S4. ABDOMEN:  Mild to moderate epigastric tenderness.  No guarding or rebound.  No mass or megaly.  Bowel sounds are normoactive.  IMPRESSION:  Alcoholic pancreatitis.  Chest x-ray is clear.  Tobacco abuse.  The plan right now is to ge serial lipases, get hepatic function panel, administer KCl 20 mEq p.o. daily, check BMET in the a.m., and check serum magnesium level for hypomagnesemia.  We will make further recommendations as the database expands.     Melvyn Novas, MD     RMD/MEDQ  D:  02/16/2012  T:  02/16/2012  Job:  [id number]

## 2012-02-17 LAB — LIPASE, BLOOD: Lipase: 85 U/L — ABNORMAL HIGH (ref 11–59)

## 2012-02-17 LAB — BASIC METABOLIC PANEL
BUN: <3 mg/dL — ABNORMAL LOW (ref 6–23)
Chloride: 104 mEq/L (ref 96–112)
GFR calc Af Amer: >90 mL/min (ref >90)
Potassium: 4.3 mEq/L (ref 3.5–5.1)

## 2012-02-17 LAB — HIV ANTIBODY (ROUTINE TESTING W REFLEX): HIV: NONREACTIVE

## 2012-02-17 LAB — RPR: RPR Ser Ql: NONREACTIVE

## 2012-02-17 MED ORDER — FOLIC ACID 1 MG PO TABS
1.0000 mg | ORAL_TABLET | Freq: Every day | ORAL | Status: DC
  Administered 2012-02-18: 1 mg via ORAL
  Filled 2012-02-17: qty 1

## 2012-02-17 MED ORDER — LORAZEPAM 1 MG PO TABS
1.0000 mg | ORAL_TABLET | Freq: Four times a day (QID) | ORAL | Status: DC | PRN
  Administered 2012-02-18: 1 mg via ORAL
  Filled 2012-02-17: qty 1

## 2012-02-17 MED ORDER — SODIUM CHLORIDE 0.9 % IJ SOLN
INTRAMUSCULAR | Status: AC
  Administered 2012-02-17: 10 mL
  Filled 2012-02-17: qty 3

## 2012-02-17 MED ORDER — ALBUTEROL SULFATE (5 MG/ML) 0.5% IN NEBU
2.5000 mg | INHALATION_SOLUTION | Freq: Four times a day (QID) | RESPIRATORY_TRACT | Status: DC | PRN
  Filled 2012-02-17: qty 0.5

## 2012-02-17 MED ORDER — LORAZEPAM 2 MG/ML IJ SOLN: 1.0000 mg | Freq: Four times a day (QID) | INTRAMUSCULAR | Status: DC | PRN

## 2012-02-17 MED ORDER — THIAMINE HCL 100 MG/ML IJ SOLN
100.0000 mg | Freq: Every day | INTRAMUSCULAR | Status: DC
  Filled 2012-02-17: qty 2

## 2012-02-17 MED ORDER — ADULT MULTIVITAMIN W/MINERALS CH
1.0000 | ORAL_TABLET | Freq: Every day | ORAL | Status: DC
  Administered 2012-02-18: 1 via ORAL
  Filled 2012-02-17: qty 1

## 2012-02-17 MED ORDER — VITAMIN B-1 100 MG PO TABS
100.0000 mg | ORAL_TABLET | Freq: Every day | ORAL | Status: DC
  Administered 2012-02-18: 100 mg via ORAL
  Filled 2012-02-17: qty 1

## 2012-02-17 NOTE — Progress Notes (Signed)
[id number] 

## 2012-02-17 NOTE — Progress Notes (Addendum)
Deleted

## 2012-02-18 LAB — BASIC METABOLIC PANEL
BUN: 3 mg/dL — ABNORMAL LOW (ref 6–23)
Chloride: 94 mEq/L — ABNORMAL LOW (ref 96–112)
GFR calc Af Amer: >90 mL/min (ref >90)
Potassium: 4.7 mEq/L (ref 3.5–5.1)

## 2012-02-18 LAB — LIPASE, BLOOD: Lipase: 39 U/L (ref 11–59)

## 2012-02-18 NOTE — Progress Notes (Signed)
NAMEDERECK, AGERTON NO.:  [id number]  MEDICAL RECORD NO.:  [id number]  LOCATION:                                 FACILITY:  PHYSICIAN:  Melvyn Novas, MDDATE OF BIRTH:  Nov 15, 1975  DATE OF PROCEDURE:  02/18/2012 DATE OF DISCHARGE:                                PROGRESS NOTE   The patient has a ethanol pancreatitis, now resolving.  Magnesium was 1.8.  Lipase now down to 39, within normal limits as of this morning. LFTs appeared normal.  Potassium 4.7 and creatinine 0.44.  PHYSICAL EXAMINATION:  VITAL SIGNS:  Blood pressure 124/71, temperature 98.4, pulse 76 and regular, respiratory rate is 20. NECK:  No JVD. LUNGS:  Scattered rhonchi.  Prolonged expiratory phase.  No rales, wheeze, or rhonchi appreciable. HEART:  Regular rhythm.  No murmurs, gallops, heaves, thrills, or rubs. ABDOMEN:  Mild epigastric tenderness to palpation.  PLAN:  Right now is advanced to full diet, see how patient tolerates the advancing diet.  Monitor the lipase in a.m. and consider discharge within 24 hours.  There are no signs of ethanol toxicity or withdrawal.     Melvyn Novas, MD     RMD/MEDQ  D:  02/18/2012  T:  02/18/2012  Job:  [id number]

## 2012-02-18 NOTE — Progress Notes (Signed)
[id number]

## 2012-02-18 NOTE — Progress Notes (Signed)
Dr. Felecia Shelling was called tonight to get an order for CIWA protocol if needed.  The patient had a box of tissues missing out of his room and was talking about them constantly, wondering who "stole" them.  He finally found out his girlfriend took them home, but is still stressing about not knowing where they were.  When I assessed his CIWA scale, he stated that he was fine and gave me 0 answers except for one item so no ativan was given.  After I was finished, he questioned whether this protocol was going to affect his pain medication and I told him no, but would add ativan if needed for agitation.  He is acting more anxious to me tonight so I wanted to be prepared in case he starts DTs.

## 2012-02-18 NOTE — Progress Notes (Signed)
[name], AMORIN NO.:  [id number]  MEDICAL RECORD NO.:  [id number]  LOCATION:                                 FACILITY:  PHYSICIAN:  Melvyn Novas, MDDATE OF BIRTH:  Oct 27, 1976  DATE OF PROCEDURE: DATE OF DISCHARGE:                                PROGRESS NOTE   The patient has ethanolic pancreatitis and some COPD, smoking a pack and half per day.  Lipase yesterday was 594, now diminished to 85. Potassium 4.3 with having some mild hypokalemia yesterday and creatinine of 0.35.  The patient appears mildly anxious, possibly not quite tremulous.  Has a nicotine patch on.  VITAL SIGNS:  Blood pressure 120/79, temperature 97.9, pulse 79 and regular, and respiratory rate is 20. NECK:  No JVD. LUNGS:  Prolonged expiratory phase.  Scattered rhonchi.  No rales, no wheeze appreciable. HEART:  Regular rate and rhythm.  No murmurs, gallops, or rubs. ABDOMEN:  Mild epigastric tenderness to palpation.  PLAN:  Right now is advance to full liquid diet, monitor amylase and renal function.  His magnesium was within normal limits at 1.8 and continue current course.     Melvyn Novas, MD     RMD/MEDQ  D:  02/17/2012  T:  02/17/2012  Job:  [id number]

## 2012-02-19 LAB — LIPASE, BLOOD: Lipase: 20 U/L (ref 11–59)

## 2012-02-19 LAB — BASIC METABOLIC PANEL
Chloride: 95 mEq/L — ABNORMAL LOW (ref 96–112)
GFR calc Af Amer: >90 mL/min (ref >90)
Potassium: 4.1 mEq/L (ref 3.5–5.1)

## 2012-02-19 MED ORDER — NICOTINE 21 MG/24HR TD PT24: 21.0000 | MEDICATED_PATCH | Freq: Every day | TRANSDERMAL | Status: AC

## 2012-02-19 NOTE — Discharge Summary (Signed)
[id number] 

## 2012-02-19 NOTE — Progress Notes (Signed)
Patient discharged home via family; Pt given and explained discharge instructions, prescriptions, carenotes, drug information sheet on Nicotine patch, "Finding Treatment for Alcohol and Drug Addiction" copies placed in chart; stated understanding and denied questions; pts IV removed without problems; pt stable and ambulitory at time of discharge

## 2012-02-20 NOTE — Discharge Summary (Signed)
NAMETERI, DILTZ NO.:  [id number]  MEDICAL RECORD NO.:  [id number]  LOCATION:                                 FACILITY:  PHYSICIAN:  Melvyn Novas, MDDATE OF BIRTH:  [birth year]  DATE OF ADMISSION:  02/15/2012 DATE OF DISCHARGE:  04/23/2013LH                              DISCHARGE SUMMARY   The patient is a 36 year old white male known ethanolic with COPD, known seizure disorder on 2 antiseizure medicines Tegretol and Keppra, who was admitted with alcoholic pancreatitis.  His lipase was 700, drifted down to 90 then to 37 and normal value.  His epigastric discomfort normalized and he was hemodynamically stable throughout the hospital course.  He had some minor tremulousness, was given thiamine for alcohol withdrawal and some benzodiazepines, and did well over 3-4 day.  He was placed on clear liquids and advanced his diet.  He was also given nicotine patch 21 mg per day for smoking cessation.  The patient had normal renal function, normal CBC and was given fluids and had 2 g sodium diet and he tolerated this well.  He was subsequently discharged on the following medicines NicoDerm CQ patch 21 mg per 24 hours #21, one daily, Tegretol 200 mg p.o. 5 times a day, Keppra 500 mg p.o. b.i.d., Tylox 5/500, 1-2 q.6h p.r.n. for chronic disk pain.  He was told to stop taking ibuprofen.  He was told to take multivitamins and to attend AA for alcohol cessation.  He understands the importance and connects the links between alcohol intake and his pancreatitis.  The patient seem motivated when he left the hospital to have alcohol cessation and his healthcare future.     Melvyn Novas, MD     RMD/MEDQ  D:  02/19/2012  T:  02/19/2012  Job:  [id number]

## 2012-03-24 ENCOUNTER — Encounter (HOSPITAL_COMMUNITY): Payer: Self-pay | Admitting: *Deleted

## 2012-03-24 ENCOUNTER — Emergency Department (HOSPITAL_COMMUNITY)
Admission: EM | Admit: 2012-03-24 | Discharge: 2012-03-25 | Disposition: A | Discharge status: Home / Self Care | Payer: Medicaid Other | Attending: Emergency Medicine | Admitting: Emergency Medicine

## 2012-03-24 DIAGNOSIS — E871 Hypo-osmolality and hyponatremia: Secondary | ICD-10-CM | POA: Insufficient documentation

## 2012-03-24 DIAGNOSIS — Z79899 Other long term (current) drug therapy: Secondary | ICD-10-CM | POA: Insufficient documentation

## 2012-03-24 DIAGNOSIS — F17200 Nicotine dependence, unspecified, uncomplicated: Secondary | ICD-10-CM | POA: Insufficient documentation

## 2012-03-24 DIAGNOSIS — R109 Unspecified abdominal pain: Secondary | ICD-10-CM

## 2012-03-24 LAB — DIFFERENTIAL
Basophils Relative: 0 % (ref 0–1)
Lymphs Abs: 2.8 10*3/uL (ref 0.7–4.0)
Monocytes Relative: 11 % (ref 3–12)
Neutro Abs: 1.9 10*3/uL (ref 1.7–7.7)
Neutrophils Relative %: 36 % — ABNORMAL LOW (ref 43–77)

## 2012-03-24 LAB — CBC
Hemoglobin: 14.6 g/dL (ref 13.0–17.0)
RBC: 3.96 MIL/uL — ABNORMAL LOW (ref 4.22–5.81)

## 2012-03-24 MED ORDER — HYDROMORPHONE HCL PF 1 MG/ML IJ SOLN
1.0000 mg | Freq: Once | INTRAMUSCULAR | Status: AC
  Administered 2012-03-24: 1 mg via INTRAVENOUS
  Filled 2012-03-24: qty 1

## 2012-03-24 MED ORDER — ONDANSETRON HCL 4 MG/2ML IJ SOLN
4.0000 mg | Freq: Once | INTRAMUSCULAR | Status: AC
  Administered 2012-03-24: 4 mg via INTRAVENOUS
  Filled 2012-03-24: qty 2

## 2012-03-24 NOTE — ED Provider Notes (Signed)
History     CSN: [id number]  Arrival date & time 03/24/12  4401   First MD Initiated Contact with Patient 03/24/12 2235      Chief Complaint  Patient presents with  . Abdominal Pain    (Consider location/radiation/quality/duration/timing/severity/associated sxs/prior treatment) HPI Comments: Pt has frequent bouts of pancreatitis.  He was here for treatment with same sxs ~ 1 month ago.  Pain subsided with IV pain meds.  He was released for follow up with dr. Janna Arch.  Last ate this AM.  Ate pinto beans.  Patient is a 36 y.o. male presenting with abdominal pain. The history is provided by the patient. No language interpreter was used.  Abdominal Pain The primary symptoms of the illness include abdominal pain. The primary symptoms of the illness do not include fever, shortness of breath, nausea, vomiting or diarrhea. The current episode started yesterday. The onset of the illness was sudden. The problem has not changed since onset. The illness is associated with alcohol use. The patient has not had a change in bowel habit. Symptoms associated with the illness do not include chills, anorexia or diaphoresis. Significant associated medical issues include substance abuse.    Past Medical History  Diagnosis Date  . Pancreatitis   . Seizures   . Back pain   . ETOH abuse   . COPD (chronic obstructive pulmonary disease)   . SAH (subarachnoid hemorrhage)   . Macrocytosis 08/17/2011  . Bradycardia 08/17/2011    Past Surgical History  Procedure Date  . Back surgery   . Septoplasty     Family History  Problem Relation Age of Onset  . Seizures Father   . Alcohol abuse Father   . Coronary artery disease Mother     deceased age 44    History  Substance Use Topics  . Smoking status: Current Everyday Smoker -- 1.0 packs/day    Types: Cigarettes  . Smokeless tobacco: Not on file  . Alcohol Use: Yes     ocassionally      Review of Systems  Constitutional: Negative for fever,  chills and diaphoresis.  Respiratory: Negative for shortness of breath.   Gastrointestinal: Positive for abdominal pain. Negative for nausea, vomiting, diarrhea and anorexia.  All other systems reviewed and are negative.    Allergies  Review of patient's allergies indicates no known allergies.  Home Medications   Current Outpatient Rx  Name Route Sig Dispense Refill  . CARBAMAZEPINE 200 MG PO TABS Oral Take 200 mg by mouth 5 (five) times daily.     Marland Kitchen LEVETIRACETAM 500 MG PO TABS Oral Take 500 mg by mouth 2 (two) times daily.     . OXYCODONE-ACETAMINOPHEN 5-500 MG PO CAPS Oral Take 2 capsules by mouth every 6 (six) hours as needed. For pain    . HYDROCODONE-ACETAMINOPHEN 5-325 MG PO TABS Oral Take 1 tablet by mouth every 6 (six) hours as needed for pain. 12 tablet 0    BP 145/94  Pulse 66  Temp(Src) 97.9 F (36.6 C) (Oral)  Resp 18  Ht 5\' 8"  (1.727 m)  Wt 120 lb (54.432 kg)  BMI 18.25 kg/m2  SpO2 99%  Physical Exam  Nursing note and vitals reviewed. Constitutional: He is oriented to person, place, and time. He appears well-developed and well-nourished.  HENT:  Head: Normocephalic and atraumatic.  Eyes: EOM are normal.  Neck: Normal range of motion.  Cardiovascular: Normal rate, regular rhythm, normal heart sounds and intact distal pulses.   Pulmonary/Chest: Effort normal and  breath sounds normal. No respiratory distress.  Abdominal: Soft. He exhibits no distension and no mass. There is tenderness in the epigastric area. There is guarding. There is no rigidity, no rebound, no tenderness at McBurney's point and negative Murphy's sign.    Musculoskeletal: Normal range of motion.  Neurological: He is alert and oriented to person, place, and time.  Skin: Skin is warm and dry.  Psychiatric: He has a normal mood and affect. Judgment normal.    ED Course  Procedures (including critical care time)  Labs Reviewed  CBC - Abnormal; Notable for the following:    RBC 3.96 (*)      MCV 104.8 (*)    MCH 36.9 (*)    All other components within normal limits  DIFFERENTIAL - Abnormal; Notable for the following:    Neutrophils Relative 36 (*)    Lymphocytes Relative 52 (*)    All other components within normal limits  COMPREHENSIVE METABOLIC PANEL - Abnormal; Notable for the following:    Sodium 127 (*)    Chloride 94 (*)    Glucose, Bld 104 (*)    BUN 3 (*)    Creatinine, Ser 0.37 (*)    All other components within normal limits  URINALYSIS, ROUTINE W REFLEX MICROSCOPIC - Abnormal; Notable for the following:    Color, Urine BROWN (*) BIOCHEMICALS MAY BE AFFECTED BY COLOR   Specific Gravity, Urine <1.005 (*)    All other components within normal limits  LIPASE, BLOOD   No results found.   1. Abdominal  pain, other specified site   2. Hyponatremia     0015-advised pt we are still waiting on lab results.  He states he is still hurting and would like another dose of pain medicine.  Another 1 mg of dilaudid ordered.  MDM  F/u with dr. Janna Arch in next couple days.        Worthy Rancher, PA 03/25/12 [phone]

## 2012-03-24 NOTE — ED Notes (Addendum)
"  Its my pancreatitis.". Upper abd pain, no vomiting ,  Drank beer yesterday

## 2012-03-25 LAB — COMPREHENSIVE METABOLIC PANEL
ALT: 9 U/L (ref 0–53)
Albumin: 3.6 g/dL (ref 3.5–5.2)
Alkaline Phosphatase: 74 U/L (ref 39–117)
BUN: 3 mg/dL — ABNORMAL LOW (ref 6–23)
Chloride: 94 mEq/L — ABNORMAL LOW (ref 96–112)
Glucose, Bld: 104 mg/dL — ABNORMAL HIGH (ref 70–99)
Potassium: 3.7 mEq/L (ref 3.5–5.1)
Total Bilirubin: 0.4 mg/dL (ref 0.3–1.2)

## 2012-03-25 LAB — URINALYSIS, ROUTINE W REFLEX MICROSCOPIC
Bilirubin Urine: NEGATIVE
Hgb urine dipstick: NEGATIVE
Ketones, ur: NEGATIVE mg/dL
Nitrite: NEGATIVE
pH: 7 (ref 5.0–8.0)

## 2012-03-25 LAB — LIPASE, BLOOD: Lipase: 13 U/L (ref 11–59)

## 2012-03-25 MED ORDER — HYDROMORPHONE HCL PF 1 MG/ML IJ SOLN
1.0000 mg | Freq: Once | INTRAMUSCULAR | Status: AC
  Administered 2012-03-25: 1 mg via INTRAVENOUS

## 2012-03-25 MED ORDER — HYDROMORPHONE HCL PF 1 MG/ML IJ SOLN
INTRAMUSCULAR | Status: AC
  Filled 2012-03-25: qty 1

## 2012-03-25 MED ORDER — HYDROCODONE-ACETAMINOPHEN 5-325 MG PO TABS: 1.0000 | ORAL_TABLET | Freq: Four times a day (QID) | ORAL | Status: DC | PRN

## 2012-03-25 NOTE — Discharge Instructions (Signed)
Abdominal Pain Abdominal pain can be caused by many things. Your caregiver decides the seriousness of your pain by an examination and possibly blood tests and X-rays. Many cases can be observed and treated at home. Most abdominal pain is not caused by a disease and will probably improve without treatment. However, in many cases, more time must pass before a clear cause of the pain can be found. Before that point, it may not be known if you need more testing, or if hospitalization or surgery is needed. HOME CARE INSTRUCTIONS   Do not take laxatives unless directed by your caregiver.   Take pain medicine only as directed by your caregiver.   Only take over-the-counter or prescription medicines for pain, discomfort, or fever as directed by your caregiver.   Try a clear liquid diet (broth, tea, or water) for as long as directed by your caregiver. Slowly move to a bland diet as tolerated.  SEEK IMMEDIATE MEDICAL CARE IF:   The pain does not go away.   You have a fever.   You keep throwing up (vomiting).   The pain is felt only in portions of the abdomen. Pain in the right side could possibly be appendicitis. In an adult, pain in the left lower portion of the abdomen could be colitis or diverticulitis.   You pass bloody or black tarry stools.  MAKE SURE YOU:   Understand these instructions.   Will watch your condition.   Will get help right away if you are not doing well or get worse.  Document Released: 07/25/2005 Document Revised: 10/04/2011 Document Reviewed: 06/02/2008 [facility] Patient Information 2012 [place], [place].your lab work is OK except for your sodium being a little low.  Drink plenty of fluids such as gatoraid.  Do not drink any alcohol.  Take the pain medicine as directed.  If needed.  Call dr. Janna Arch and make an appt to be seen in the next couple days.

## 2012-03-25 NOTE — ED Provider Notes (Signed)
Medical screening examination/treatment/procedure(s) were performed by non-physician practitioner and as supervising physician I was immediately available for consultation/collaboration. Luwanna Brossman, MD, FACEP   Yehudis Monceaux L Kaydin Labo, MD 03/25/12 0124 

## 2012-04-03 ENCOUNTER — Emergency Department (HOSPITAL_COMMUNITY)
Admission: EM | Admit: 2012-04-03 | Discharge: 2012-04-03 | Disposition: A | Discharge status: Home / Self Care | Payer: Medicaid Other | Attending: Emergency Medicine | Admitting: Emergency Medicine

## 2012-04-03 ENCOUNTER — Encounter (HOSPITAL_COMMUNITY): Payer: Self-pay

## 2012-04-03 DIAGNOSIS — R1013 Epigastric pain: Secondary | ICD-10-CM | POA: Insufficient documentation

## 2012-04-03 DIAGNOSIS — J4489 Other specified chronic obstructive pulmonary disease: Secondary | ICD-10-CM | POA: Insufficient documentation

## 2012-04-03 DIAGNOSIS — J449 Chronic obstructive pulmonary disease, unspecified: Secondary | ICD-10-CM | POA: Insufficient documentation

## 2012-04-03 DIAGNOSIS — R109 Unspecified abdominal pain: Secondary | ICD-10-CM

## 2012-04-03 DIAGNOSIS — F17200 Nicotine dependence, unspecified, uncomplicated: Secondary | ICD-10-CM | POA: Insufficient documentation

## 2012-04-03 MED ORDER — OXYCODONE-ACETAMINOPHEN 5-325 MG PO TABS
2.0000 | ORAL_TABLET | Freq: Once | ORAL | Status: AC
  Administered 2012-04-03: 2 via ORAL
  Filled 2012-04-03: qty 2

## 2012-04-03 MED ORDER — ONDANSETRON 8 MG PO TBDP
8.0000 mg | ORAL_TABLET | Freq: Once | ORAL | Status: AC
  Administered 2012-04-03: 8 mg via ORAL
  Filled 2012-04-03: qty 1

## 2012-04-03 NOTE — ED Notes (Signed)
Per pt "my pancreatitis is acting up", denies any n/v/d or fever, only complaint is pain

## 2012-04-03 NOTE — ED Notes (Signed)
Pt stated he is out of his pain medicine "but it don't help anyway"

## 2012-04-03 NOTE — ED Notes (Signed)
Pt is upset because he is not getting iv pain medication and nausea.

## 2012-04-03 NOTE — Discharge Instructions (Signed)

## 2012-04-03 NOTE — ED Provider Notes (Signed)
History  This chart was scribed for Joya Gaskins, MD by Bennett Scrape. This patient was seen in room APA16A/APA16A and the patient's care was started at 3:05PM.  CSN: [id number]  Arrival date & time 04/03/12  1416   First MD Initiated Contact with Patient 04/03/12 1505      Chief Complaint  Patient presents with  . Pancreatitis    Patient is a 36 y.o. male presenting with abdominal pain. The history is provided by the patient. No language interpreter was used.  Abdominal Pain The current episode started more than 2 days ago. The onset of the illness was gradual. The problem has been gradually worsening.  The illness is associated with alcohol use. Significant associated medical issues do not include inflammatory bowel disease, diabetes or diverticulitis.    [name] is a 36 y.o. male with a h/o pancreatitis who presents to the Emergency Department complaining of 3 days of gradual onset, gradually worsening, constant epigastric abdominal pain described as a pancreatitis flare up. Pt states that he has been consuming ETOH for the past couple of days and reports that ETOH is a trigger for his pancreatitis. He denies modifying factors. He has not taken any OTC medications to improve his pain. He denies prior abdominal surgeries. He denies nausea, emesis, diarrhea, chest pain, rash, dysuria, back pain and fever as associated symptoms. He also has a h/o ETOH abuse, seizures, back pain, COPD, SAH, bradycardia and macrocytosis. He is a current everyday smoker and daily alcohol user.   Past Medical History  Diagnosis Date  . Pancreatitis   . Seizures   . Back pain   . ETOH abuse   . COPD (chronic obstructive pulmonary disease)   . SAH (subarachnoid hemorrhage)   . Macrocytosis 08/17/2011  . Bradycardia 08/17/2011    Past Surgical History  Procedure Date  . Back surgery   . Septoplasty     Family History  Problem Relation Age of Onset  . Seizures Father   . Alcohol  abuse Father   . Coronary artery disease Mother     deceased age 64    History  Substance Use Topics  . Smoking status: Current Everyday Smoker -- 1.0 packs/day    Types: Cigarettes  . Smokeless tobacco: Not on file  . Alcohol Use: Yes     every day      Review of Systems  A complete 10 system review of systems was obtained and all systems are negative except as noted in the HPI and PMH.   Allergies  Review of patient's allergies indicates no known allergies.  Home Medications   Current Outpatient Rx  Name Route Sig Dispense Refill  . CARBAMAZEPINE 200 MG PO TABS Oral Take 200 mg by mouth 5 (five) times daily.     Marland Kitchen HYDROCODONE-ACETAMINOPHEN 5-325 MG PO TABS Oral Take 1 tablet by mouth every 6 (six) hours as needed for pain. 12 tablet 0  . LEVETIRACETAM 500 MG PO TABS Oral Take 500 mg by mouth 2 (two) times daily.     . OXYCODONE-ACETAMINOPHEN 5-500 MG PO CAPS Oral Take 2 capsules by mouth every 6 (six) hours as needed. For pain      Triage Vitals: BP 150/99  Pulse 101  Temp(Src) 98.1 F (36.7 C) (Oral)  Resp 20  Ht 5\' 8"  (1.727 m)  Wt 120 lb (54.432 kg)  BMI 18.25 kg/m2  SpO2 98%  Physical Exam  Nursing note and vitals reviewed.  CONSTITUTIONAL: Well developed/well nourished  HEAD AND FACE: Normocephalic/atraumatic EYES: EOMI/PERRL, no scleral icterus ENMT: Mucous membranes moist NECK: supple no meningeal signs SPINE:entire spine nontender CV: S1/S2 noted, no murmurs/rubs/gallops noted LUNGS: Lungs are clear to auscultation bilaterally, no apparent distress ABDOMEN: soft, moderate epigastric tenderness, no rebound or guarding GU:no cva tenderness NEURO: Pt is awake/alert, moves all extremitiesx4 EXTREMITIES: pulses normal, full ROM SKIN: warm, color normal PSYCH: no abnormalities of mood noted  ED Course  Procedures   DIAGNOSTIC STUDIES: Oxygen Saturation is 98% on room air, normal by my interpretation.    COORDINATION OF CARE: 3:50PM-Discussed  treatment plan with pt and pt agreed to plan.   Pt improved, no vomiting, walking around ED in no distress Similar to previous abd pain, reports ETOH is his trigger Abdomen soft on repeat exam Denies cp, doubt ACS No signs of acute abdomen Advised to f/u with PCP   The patient appears reasonably screened and/or stabilized for discharge and I doubt any other medical condition or other [facility] requiring further screening, evaluation, or treatment in the ED at this time prior to discharge.      MDM  Nursing notes including past medical history, social history and family history reviewed and considered in documentation Previous records reviewed and considered - recent lipase was normal on last check     I personally performed the services described in this documentation, which was scribed in my presence. The recorded information has been reviewed and considered.        Joya Gaskins, MD 04/03/12 [phone]

## 2012-04-17 NOTE — Progress Notes (Signed)
UR Chart Review Completed  

## 2012-04-20 ENCOUNTER — Emergency Department (HOSPITAL_COMMUNITY)
Admission: EM | Admit: 2012-04-20 | Discharge: 2012-04-20 | Disposition: A | Discharge status: Home / Self Care | Payer: Medicaid Other | Source: Home / Self Care | Attending: Emergency Medicine | Admitting: Emergency Medicine

## 2012-04-20 ENCOUNTER — Encounter (HOSPITAL_COMMUNITY): Payer: Self-pay | Admitting: Emergency Medicine

## 2012-04-20 ENCOUNTER — Emergency Department (HOSPITAL_COMMUNITY)
Admission: EM | Admit: 2012-04-20 | Discharge: 2012-04-20 | Disposition: A | Discharge status: Home / Self Care | Payer: Medicaid Other | Attending: Emergency Medicine | Admitting: Emergency Medicine

## 2012-04-20 DIAGNOSIS — K859 Acute pancreatitis without necrosis or infection, unspecified: Secondary | ICD-10-CM | POA: Insufficient documentation

## 2012-04-20 DIAGNOSIS — J4489 Other specified chronic obstructive pulmonary disease: Secondary | ICD-10-CM | POA: Insufficient documentation

## 2012-04-20 DIAGNOSIS — J449 Chronic obstructive pulmonary disease, unspecified: Secondary | ICD-10-CM | POA: Insufficient documentation

## 2012-04-20 DIAGNOSIS — Z79899 Other long term (current) drug therapy: Secondary | ICD-10-CM | POA: Insufficient documentation

## 2012-04-20 DIAGNOSIS — F1010 Alcohol abuse, uncomplicated: Secondary | ICD-10-CM

## 2012-04-20 DIAGNOSIS — R1013 Epigastric pain: Secondary | ICD-10-CM | POA: Insufficient documentation

## 2012-04-20 DIAGNOSIS — G40909 Epilepsy, unspecified, not intractable, without status epilepticus: Secondary | ICD-10-CM | POA: Insufficient documentation

## 2012-04-20 DIAGNOSIS — F17200 Nicotine dependence, unspecified, uncomplicated: Secondary | ICD-10-CM | POA: Insufficient documentation

## 2012-04-20 DIAGNOSIS — R109 Unspecified abdominal pain: Secondary | ICD-10-CM

## 2012-04-20 LAB — COMPREHENSIVE METABOLIC PANEL
ALT: 15 U/L (ref 0–53)
AST: 39 U/L — ABNORMAL HIGH (ref 0–37)
Albumin: 3.7 g/dL (ref 3.5–5.2)
Alkaline Phosphatase: 101 U/L (ref 39–117)
Calcium: 8.9 mg/dL (ref 8.4–10.5)
GFR calc Af Amer: >90 mL/min (ref >90)
Potassium: 3.7 mEq/L (ref 3.5–5.1)
Sodium: 129 mEq/L — ABNORMAL LOW (ref 135–145)
Total Protein: 7.2 g/dL (ref 6.0–8.3)

## 2012-04-20 LAB — CBC
MCH: 39 pg — ABNORMAL HIGH (ref 26.0–34.0)
MCV: 108.6 fL — ABNORMAL HIGH (ref 78.0–100.0)
Platelets: 187 10*3/uL (ref 150–400)
RBC: 3.62 MIL/uL — ABNORMAL LOW (ref 4.22–5.81)
RDW: 14.1 % (ref 11.5–15.5)
WBC: 4.8 10*3/uL (ref 4.0–10.5)

## 2012-04-20 LAB — DIFFERENTIAL
Basophils Absolute: 0 10*3/uL (ref 0.0–0.1)
Eosinophils Absolute: 0.1 10*3/uL (ref 0.0–0.7)
Eosinophils Relative: 1 % (ref 0–5)
Neutrophils Relative %: 42 % — ABNORMAL LOW (ref 43–77)

## 2012-04-20 MED ORDER — SODIUM CHLORIDE 0.9 % IV SOLN: INTRAVENOUS | Status: DC

## 2012-04-20 MED ORDER — ONDANSETRON 8 MG PO TBDP
8.0000 mg | ORAL_TABLET | ORAL | Status: DC | PRN
  Filled 2012-04-20: qty 2

## 2012-04-20 MED ORDER — HYDROMORPHONE HCL PF 1 MG/ML IJ SOLN
1.0000 mg | Freq: Once | INTRAMUSCULAR | Status: AC
  Administered 2012-04-20: 1 mg via INTRAVENOUS
  Filled 2012-04-20: qty 1

## 2012-04-20 MED ORDER — PROMETHAZINE HCL 25 MG/ML IJ SOLN
25.0000 mg | Freq: Once | INTRAMUSCULAR | Status: AC
  Administered 2012-04-20: 25 mg via INTRAMUSCULAR
  Filled 2012-04-20: qty 1

## 2012-04-20 MED ORDER — PROMETHAZINE HCL 25 MG PO TABS: 25.0000 mg | ORAL_TABLET | Freq: Four times a day (QID) | ORAL | Status: DC | PRN

## 2012-04-20 MED ORDER — METOCLOPRAMIDE HCL 5 MG/ML IJ SOLN
10.0000 mg | Freq: Once | INTRAMUSCULAR | Status: AC
  Administered 2012-04-20: 10 mg via INTRAVENOUS
  Filled 2012-04-20: qty 2

## 2012-04-20 MED ORDER — HYDROMORPHONE HCL PF 2 MG/ML IJ SOLN
2.0000 mg | Freq: Once | INTRAMUSCULAR | Status: AC
  Administered 2012-04-20: 2 mg via INTRAMUSCULAR
  Filled 2012-04-20: qty 1

## 2012-04-20 MED ORDER — SODIUM CHLORIDE 0.9 % IV BOLUS (SEPSIS)
1000.0000 mL | Freq: Once | INTRAVENOUS | Status: AC
  Administered 2012-04-20: 1000 mL via INTRAVENOUS

## 2012-04-20 MED ORDER — ONDANSETRON 8 MG PO TBDP: ORAL_TABLET | ORAL | Status: AC

## 2012-04-20 MED ORDER — OXYCODONE-ACETAMINOPHEN 5-325 MG PO TABS: 2.0000 | ORAL_TABLET | Freq: Four times a day (QID) | ORAL | Status: AC | PRN

## 2012-04-20 MED ORDER — OXYCODONE-ACETAMINOPHEN 5-325 MG PO TABS: 1.0000 | ORAL_TABLET | ORAL | Status: AC | PRN

## 2012-04-20 MED ORDER — OXYCODONE-ACETAMINOPHEN 5-325 MG PO TABS: 2.0000 | ORAL_TABLET | Freq: Four times a day (QID) | ORAL | Status: DC | PRN

## 2012-04-20 NOTE — ED Notes (Signed)
Pt discharged with pre-pack of Percocet per MD discharge orders and instructed to follow up with physician tomorrow for uncontrolled pain.  Verbalized understanding.

## 2012-04-20 NOTE — Discharge Instructions (Signed)
Acute Pancreatitis The pancreas is a large gland located behind your stomach. It produces (secretes) enzymes. These enzymes help digest food. It also releases the hormones glucagon and insulin. These hormones help regulate blood sugar. When the pancreas becomes inflamed, the disease is called pancreatitis. Inflammation of the pancreas occurs when enzymes from the pancreas begin attacking and digesting the pancreas. CAUSES  Most cases ofsudden onset (acute) pancreatitis are caused by:  Alcohol abuse.   Gallstones.  Other less common causes are:  Some medications.   Exposure to certain chemicals   Infection.   Damage caused by an accident (trauma).   Surgery of the belly (abdomen).  SYMPTOMS  Acute pancreatitis usually begins with pain in the upper abdomen and may radiate to the back. This pain may last a couple days. The constant pain varies from mild to severe. The acute form of this disease may vary from mild, nonspecific abdominal pain to profound shock with coma. About 1 in 5 cases are severe. These patients become dehydrated and develop low blood pressure. In severe cases, bleeding into the pancreas can lead to shock and death. The lungs, heart, and kidneys may fail. DIAGNOSIS  Your caregiver will form a clinical opinion after giving you an exam. Laboratory work is used to confirm this diagnosis. Often,a digestive enzyme from the pancreas (serum amylase) and other enzymes are elevated. Sugars and fats (lipids) in the blood may be elevated. There may also be changes in the following levels: calcium, magnesium, potassium, chloride and bicarbonate (chemicals in the blood). X-rays, a CT scan, or ultrasound of your abdomen may be necessary to search for other causes of your abdominal pain. TREATMENT  Most pancreatitis requires treatment of symptoms. Most acute attacks last a couple of days. Your caregiver can discuss the treatment options with you.  If complications occur, hospitalization  may be necessary for pain control and intravenous (IV) fluid replacement.   Sometimes, a tube may be put into the stomach to control vomiting.   Food may not be allowed for 3 to 4 days. This gives the pancreas time to rest. Giving the pancreas a rest means there is no stimulation that would produce more enzymes and cause more damage.   Medicines (antibiotics) that kill germs may be given if infection is the cause.   Sometimes, surgery may be required.   Following an acute attack, your caregiver will determine the cause, if possible, and offer suggestions to prevent recurrences.  HOME CARE INSTRUCTIONS   Eat smaller, more frequent meals. This reduces the amount of digestive juices the pancreas produces.   Decrease the amount of fat in your diet. This may help reduce loose, diarrheal stools.   Drink enough water and fluids to keep your urine clear or pale yellow. This is to avoid dehydration which can cause increased pain.   Talk to your caregiver about pain relievers or other medicines that may help.   Avoid anything that may have triggered your pancreatitis (for example, alcohol).   Follow the diet advised by your caregiver. Do not advance the diet too soon.   Take medicines as prescribed.   Get plenty of rest.   Check your blood sugar at home as directed by your caregiver.   If your caregiver has given you a follow-up appointment, it is very important to keep that appointment. Not keeping the appointment could result in a lasting (chronic) or permanent injury, pain, and disability. If there is any problem keeping the appointment, you must call to reschedule.    SEEK MEDICAL CARE IF:   You are not recovering in the time described by your caregiver.   You have persistent pain, weakness, or feel sick to your stomach (nauseous).   You have recovered and then have another bout of pain.  SEEK IMMEDIATE MEDICAL CARE IF:   You are unable to eat or keep fluids down.   Your pain  increases a lot or changes.   You have an oral temperature above 102 F (38.9 C), not controlled by medicine.   Your skin or the white part of your eyes look yellow (jaundice).   You develop vomiting.   You feel dizzy or faint.   Your blood sugar is high (over 300).  MAKE SURE YOU:   Understand these instructions.   Will watch your condition.   Will get help right away if you are not doing well or get worse.  Document Released: 10/15/2005 Document Revised: 10/04/2011 Document Reviewed: 05/28/2008 [facility] Patient Information 2012 [place], [place].  Take meds as directed and followup with Dr. Delbert Harness.

## 2012-04-20 NOTE — Discharge Instructions (Signed)
Alcohol Problems Most adults who drink alcohol drink in moderation (not a lot) are at low risk for developing problems related to their drinking. However, all drinkers, including low-risk drinkers, should know about the health risks connected with drinking alcohol. RECOMMENDATIONS FOR LOW-RISK DRINKING  Drink in moderation. Moderate drinking is defined as follows:   Men - no more than 2 drinks per day.   Nonpregnant women - no more than 1 drink per day.   Over age 97 - no more than 1 drink per day.  A standard drink is 12 grams of pure alcohol, which is equal to a 12 ounce bottle of beer or wine cooler, a 5 ounce glass of wine, or 1.5 ounces of distilled spirits (such as whiskey, brandy, vodka, or rum).  ABSTAIN FROM (DO NOT DRINK) ALCOHOL:  When pregnant or considering pregnancy.   When taking a medication that interacts with alcohol.   If you are alcohol dependent.   A medical condition that prohibits drinking alcohol (such as ulcer, liver disease, or heart disease).  DISCUSS WITH YOUR CAREGIVER:  If you are at risk for coronary heart disease, discuss the potential benefits and risks of alcohol use: Light to moderate drinking is associated with lower rates of coronary heart disease in certain populations (for example, men over age 87 and postmenopausal women). Infrequent or nondrinkers are advised not to begin light to moderate drinking to reduce the risk of coronary heart disease so as to avoid creating an alcohol-related problem. Similar protective effects can likely be gained through proper diet and exercise.   Women and the elderly have smaller amounts of body water than men. As a result women and the elderly achieve a higher blood alcohol concentration after drinking the same amount of alcohol.   Exposing a fetus to alcohol can cause a broad range of birth defects referred to as Fetal Alcohol Syndrome (FAS) or Alcohol-Related Birth Defects (ARBD). Although FAS/ARBD is connected with  excessive alcohol consumption during pregnancy, studies also have reported neurobehavioral problems in infants born to mothers reporting drinking an average of 1 drink per day during pregnancy.   Heavier drinking (the consumption of more than 4 drinks per occasion by men and more than 3 drinks per occasion by women) impairs learning (cognitive) and psychomotor functions and increases the risk of alcohol-related problems, including accidents and injuries.  CAGE QUESTIONS:   Have you ever felt that you should Cut down on your drinking?   Have people Annoyed you by criticizing your drinking?   Have you ever felt bad or Guilty about your drinking?   Have you ever had a drink first thing in the morning to steady your nerves or get rid of a hangover (Eye opener)?  If you answered positively to any of these questions: You may be at risk for alcohol-related problems if alcohol consumption is:   Men: Greater than 14 drinks per week or more than 4 drinks per occasion.   Women: Greater than 7 drinks per week or more than 3 drinks per occasion.  Do you or your family have a medical history of alcohol-related problems, such as:  Blackouts.   Sexual dysfunction.   Depression.   Trauma.   Liver dysfunction.   Sleep disorders.   Hypertension.   Chronic abdominal pain.   Has your drinking ever caused you problems, such as problems with your family, problems with your work (or school) performance, or accidents/injuries?   Do you have a compulsion to drink or a preoccupation  with drinking?   Do you have poor control or are you unable to stop drinking once you have started?   Do you have to drink to avoid withdrawal symptoms?   Do you have problems with withdrawal such as tremors, nausea, sweats, or mood disturbances?   Does it take more alcohol than in the past to get you high?   Do you feel a strong urge to drink?   Do you change your plans so that you can have a drink?   Do you ever  drink in the morning to relieve the shakes or a hangover?  If you have answered a number of the previous questions positively, it may be time for you to talk to your caregivers, family, and friends and see if they think you have a problem. Alcoholism is a chemical dependency that keeps getting worse and will eventually destroy your health and relationships. Many alcoholics end up dead, impoverished, or in prison. This is often the end result of all chemical dependency.  Do not be discouraged if you are not ready to take action immediately.   Decisions to change behavior often involve up and down desires to change and feeling like you cannot decide.   Try to think more seriously about your drinking behavior.   Think of the reasons to quit.  WHERE TO GO FOR ADDITIONAL INFORMATION   The National Institute on Alcohol Abuse and Alcoholism (NIAAA)www.niaaa.nih.gov   [employer] on Alcoholism and Drug Dependence (NCADD)www.ncadd.org   American Society of Addiction Medicine (ASAM)www.https://anderson-johnson.com/  Document Released: 10/15/2005 Document Revised: 10/04/2011 Document Reviewed: 06/02/2008 [facility] Patient Information 2012 [place], [place].  Abdominal (belly) pain can be caused by many things. Your caregiver performed an examination and possibly ordered blood/urine tests and imaging (CT scan, x-rays, ultrasound). Many cases can be observed and treated at home after initial evaluation in the emergency department. Even though you are being discharged home, abdominal pain can be unpredictable. Therefore, you need a repeated exam if your pain does not resolve, returns, or worsens. Most patients with abdominal pain don't have to be admitted to the hospital or have surgery, but serious problems like appendicitis and gallbladder attacks can start out as nonspecific pain. Many abdominal conditions cannot be diagnosed in one visit, so follow-up evaluations are very important. SEEK IMMEDIATE MEDICAL ATTENTION IF: The  pain does not go away or becomes severe.  A temperature above 101 develops.  Repeated vomiting occurs (multiple episodes).  The pain becomes localized to portions of the abdomen. The right side could possibly be appendicitis. In an adult, the left lower portion of the abdomen could be colitis or diverticulitis.  Blood is being passed in stools or vomit (bright red or black tarry stools).  Return also if you develop chest pain, difficulty breathing, dizziness or fainting, or become confused, poorly responsive, or inconsolable (young children).

## 2012-04-20 NOTE — ED Provider Notes (Signed)
History     CSN: [id number]  Arrival date & time 04/20/12  4098   First MD Initiated Contact with Patient 04/20/12 0915      Chief Complaint  Patient presents with  . Pancreatitis    (Consider location/radiation/quality/duration/timing/severity/associated sxs/prior treatment) The history is provided by the patient.  patient is a 36 year old male well-known to [place] history of chronic pancreatitis weekly comes in for the complaint of abdominal pain. Donald pain started 2 days ago. Tell pain is in the epigastric area patient rates a 10 out of 10 associated with nausea but no vomiting. Similar to his past pancreatitis bouts. Patient's primary care doctors Dr. Delbert Harness.pain is described a sharp.  Past Medical History  Diagnosis Date  . Pancreatitis   . Seizures   . Back pain   . ETOH abuse   . COPD (chronic obstructive pulmonary disease)   . SAH (subarachnoid hemorrhage)   . Macrocytosis 08/17/2011  . Bradycardia 08/17/2011    Past Surgical History  Procedure Date  . Back surgery   . Septoplasty     Family History  Problem Relation Age of Onset  . Seizures Father   . Alcohol abuse Father   . Coronary artery disease Mother     deceased age 19    History  Substance Use Topics  . Smoking status: Current Everyday Smoker -- 1.0 packs/day    Types: Cigarettes  . Smokeless tobacco: Not on file  . Alcohol Use: Yes     every day      Review of Systems  Constitutional: Negative for fever and chills.  HENT: Negative for congestion and neck pain.   Eyes: Negative for redness.  Respiratory: Negative for shortness of breath.   Cardiovascular: Negative for chest pain.  Gastrointestinal: Positive for nausea and abdominal pain. Negative for vomiting.  Genitourinary: Negative for dysuria.  Musculoskeletal: Negative for back pain.  Skin: Negative for rash.  Neurological: Negative for headaches.  Hematological: Does not bruise/bleed easily.    Allergies  Review of  patient's allergies indicates no known allergies.  Home Medications   Current Outpatient Rx  Name Route Sig Dispense Refill  . CARBAMAZEPINE 200 MG PO TABS Oral Take 200-400 mg by mouth 3 (three) times daily. Take 2 tablets in the morning, 1 tablet at lunch and 2 tablets at bedtime.    Marland Kitchen LEVETIRACETAM 500 MG PO TABS Oral Take 500 mg by mouth 2 (two) times daily.     . OXYCODONE-ACETAMINOPHEN 5-500 MG PO CAPS Oral Take 2 capsules by mouth every 6 (six) hours as needed. For pain    . OXYCODONE-ACETAMINOPHEN 5-325 MG PO TABS Oral Take 1-2 tablets by mouth every 4 (four) hours as needed for pain. 15 tablet 0  . PROMETHAZINE HCL 25 MG PO TABS Oral Take 0.5 tablets (12.5 mg total) by mouth every 6 (six) hours as needed for nausea. 10 tablet 0  . PROMETHAZINE HCL 25 MG PO TABS Oral Take 1 tablet (25 mg total) by mouth every 6 (six) hours as needed for nausea. 12 tablet 0  . PROMETHAZINE HCL 25 MG PO TABS Oral Take 1 tablet (25 mg total) by mouth every 6 (six) hours as needed for nausea. 12 tablet 0    BP 168/87  Pulse 53  Temp 98 F (36.7 C)  Resp 16  Ht 5\' 9"  (1.753 m)  Wt 120 lb (54.432 kg)  BMI 17.72 kg/m2  SpO2 98%  Physical Exam  Nursing note and vitals reviewed. Constitutional: He  is oriented to person, place, and time. He appears well-developed and well-nourished. No distress.  HENT:  Head: Normocephalic and atraumatic.  Mouth/Throat: Oropharynx is clear and moist.  Eyes: Conjunctivae and EOM are normal. Pupils are equal, round, and reactive to light.  Neck: Normal range of motion. Neck supple.  Cardiovascular: Normal rate, regular rhythm and normal heart sounds.   No murmur heard. Pulmonary/Chest: Effort normal and breath sounds normal.  Abdominal: Soft. Bowel sounds are normal. He exhibits no mass. There is tenderness. There is no rebound and no guarding.  Musculoskeletal: Normal range of motion. He exhibits no edema.  Neurological: He is alert and oriented to person, place,  and time. No cranial nerve deficit. He exhibits normal muscle tone. Coordination normal.  Skin: Skin is warm. No rash noted.    ED Course  Procedures (including critical care time)  Labs Reviewed  CBC - Abnormal; Notable for the following:    RBC 3.62 (*)     MCV 108.6 (*)     MCH 39.0 (*)     All other components within normal limits  DIFFERENTIAL - Abnormal; Notable for the following:    Neutrophils Relative 42 (*)     Lymphocytes Relative 47 (*)     All other components within normal limits  COMPREHENSIVE METABOLIC PANEL - Abnormal; Notable for the following:    Sodium 129 (*)     Chloride 93 (*)     Glucose, Bld 112 (*)     BUN 4 (*)     Creatinine, Ser 0.36 (*)     AST 39 (*)     Total Bilirubin 0.2 (*)     All other components within normal limits  LIPASE, BLOOD - Abnormal; Notable for the following:    Lipase 79 (*)     All other components within normal limits   No results found. Results for orders placed during the hospital encounter of 04/20/12  CBC      Component Value Range   WBC 4.8  4.0 - 10.5 K/uL   RBC 3.62 (*) 4.22 - 5.81 MIL/uL   Hemoglobin 14.1  13.0 - 17.0 g/dL   HCT 16.1  09.6 - 04.5 %   MCV 108.6 (*) 78.0 - 100.0 fL   MCH 39.0 (*) 26.0 - 34.0 pg   MCHC 35.9  30.0 - 36.0 g/dL   RDW 40.9  81.1 - 91.4 %   Platelets 187  150 - 400 K/uL  DIFFERENTIAL      Component Value Range   Neutrophils Relative 42 (*) 43 - 77 %   Neutro Abs 2.0  1.7 - 7.7 K/uL   Lymphocytes Relative 47 (*) 12 - 46 %   Lymphs Abs 2.3  0.7 - 4.0 K/uL   Monocytes Relative 9  3 - 12 %   Monocytes Absolute 0.4  0.1 - 1.0 K/uL   Eosinophils Relative 1  0 - 5 %   Eosinophils Absolute 0.1  0.0 - 0.7 K/uL   Basophils Relative 0  0 - 1 %   Basophils Absolute 0.0  0.0 - 0.1 K/uL  COMPREHENSIVE METABOLIC PANEL      Component Value Range   Sodium 129 (*) 135 - 145 mEq/L   Potassium 3.7  3.5 - 5.1 mEq/L   Chloride 93 (*) 96 - 112 mEq/L   CO2 24  19 - 32 mEq/L   Glucose, Bld 112 (*)  70 - 99 mg/dL   BUN 4 (*) 6 - 23  mg/dL   Creatinine, Ser 4.09 (*) 0.50 - 1.35 mg/dL   Calcium 8.9  8.4 - 81.1 mg/dL   Total Protein 7.2  6.0 - 8.3 g/dL   Albumin 3.7  3.5 - 5.2 g/dL   AST 39 (*) 0 - 37 U/L   ALT 15  0 - 53 U/L   Alkaline Phosphatase 101  39 - 117 U/L   Total Bilirubin 0.2 (*) 0.3 - 1.2 mg/dL   GFR calc non Af Amer >90  >90 mL/min   GFR calc Af Amer >90  >90 mL/min  LIPASE, BLOOD      Component Value Range   Lipase 79 (*) 11 - 59 U/L     1. Pancreatitis       MDM  Patient with known history of pancreatitis exacerbation pancreatitis today lipase is elevated. Patient's sodium is low once again patient given IV normal saline bolus. Potassium is good today 3.7. Patient without acute abdomen this should be consistent with his chronic pancreatitis. Discharge home with pain medicine patient given IM hydromorphone in the emergency department with improvement. Patient has a primary care doctor and followup with them.        Shelda Jakes, MD 04/20/12 [phone]

## 2012-04-20 NOTE — ED Notes (Signed)
Pt vomiting; stating pain is at a 10

## 2012-04-20 NOTE — ED Notes (Addendum)
Patient complaining of abdominal pain for "a couple of days." Also complaining of nausea, denies vomiting. Has history of pancreatitis, states he was seen here earlier today for same.

## 2012-04-20 NOTE — ED Provider Notes (Signed)
History   Scribed for No att. providers found, the patient was seen in APOTF/OTF. The chart was scribed by Gilman Schmidt. The patients care was started at 1:43 AM.   CSN: [id number]  Arrival date & time 04/20/12  1900   First MD Initiated Contact with Patient 04/20/12 1939      Chief Complaint  Patient presents with  . Abdominal Pain    (Consider location/radiation/quality/duration/timing/severity/associated sxs/prior treatment) Patient is a 36 y.o. male presenting with abdominal pain.  Abdominal Pain The primary symptoms of the illness include abdominal pain and nausea. The primary symptoms of the illness do not include fever, shortness of breath or vomiting.  Symptoms associated with the illness do not include back pain.   [name] is a 36 y.o. male with a hx of multiple illnesses including Pancreatitis, ETOH abuse, COPD, SAH, and Seizures who presents to the Emergency Department complaining of constant epigastric abdominal pain onset two days. Pt was seen earlier today for same symptoms. Also reports having sx few weeks prior that subsided and returned with worsening symptoms. Denies any other symptoms. Pt reports continued use of alcohol. Pt had rx for pain pills but could not afford to get them filled. There are no other associated symptoms and no other alleviating or aggravating factors.   PCP: [name]  Past Medical History  Diagnosis Date  . Pancreatitis   . Seizures   . Back pain   . ETOH abuse   . COPD (chronic obstructive pulmonary disease)   . SAH (subarachnoid hemorrhage)   . Macrocytosis 08/17/2011  . Bradycardia 08/17/2011    Past Surgical History  Procedure Date  . Back surgery   . Septoplasty     Family History  Problem Relation Age of Onset  . Seizures Father   . Alcohol abuse Father   . Coronary artery disease Mother     deceased age 49    History  Substance Use Topics  . Smoking status: Current Everyday Smoker -- 1.0 packs/day    Types:  Cigarettes  . Smokeless tobacco: Not on file  . Alcohol Use: Yes     every day      Review of Systems  Constitutional: Negative for fever.       10 Systems reviewed and are negative for acute change except as noted in the HPI.  HENT: Negative for congestion.   Eyes: Negative for discharge and redness.  Respiratory: Negative for cough and shortness of breath.   Cardiovascular: Negative for chest pain.  Gastrointestinal: Positive for nausea and abdominal pain. Negative for vomiting.  Musculoskeletal: Negative for back pain.  Skin: Negative for rash.  Neurological: Negative for syncope, numbness and headaches.  Psychiatric/Behavioral:       No behavior change.  All other systems reviewed and are negative.    Allergies  Review of patient's allergies indicates no known allergies.  Home Medications   Current Outpatient Rx  Name Route Sig Dispense Refill  . CARBAMAZEPINE 200 MG PO TABS Oral Take 200-400 mg by mouth 3 (three) times daily. Take 2 tablets in the morning, 1 tablet at lunch and 2 tablets at bedtime.    Marland Kitchen LEVETIRACETAM 500 MG PO TABS Oral Take 500 mg by mouth 2 (two) times daily.     . OXYCODONE-ACETAMINOPHEN 5-500 MG PO CAPS Oral Take 2 capsules by mouth every 6 (six) hours as needed. For pain    . PROMETHAZINE HCL 25 MG PO TABS Oral Take 1 tablet (25 mg total) by  mouth every 6 (six) hours as needed for nausea. 12 tablet 0  . ONDANSETRON 8 MG PO TBDP  8mg  ODT q4 hours prn nausea 2 tablet 0  . OXYCODONE-ACETAMINOPHEN 5-325 MG PO TABS Oral Take 1-2 tablets by mouth every 4 (four) hours as needed for pain. 15 tablet 0  . OXYCODONE-ACETAMINOPHEN 5-325 MG PO TABS Oral Take 2 tablets by mouth every 6 (six) hours as needed for pain. 6 tablet 0  . PROMETHAZINE HCL 25 MG PO TABS Oral Take 0.5 tablets (12.5 mg total) by mouth every 6 (six) hours as needed for nausea. 10 tablet 0  . PROMETHAZINE HCL 25 MG PO TABS Oral Take 1 tablet (25 mg total) by mouth every 6 (six) hours as  needed for nausea. 12 tablet 0    BP 180/105  Pulse 66  Temp 97.7 F (36.5 C) (Oral)  Resp 20  Ht 5\' 8"  (1.727 m)  Wt 120 lb (54.432 kg)  BMI 18.25 kg/m2  SpO2 98%  Physical Exam  Nursing note and vitals reviewed. Constitutional:       Awake, alert, nontoxic appearance.  HENT:  Head: Atraumatic.  Mouth/Throat: Oropharynx is clear and moist.  Eyes: Right eye exhibits no discharge. Left eye exhibits no discharge.  Neck: Neck supple.  Pulmonary/Chest: Effort normal. He exhibits no tenderness.  Abdominal: Soft. There is tenderness in the epigastric area. There is no rebound.  Musculoskeletal: He exhibits no tenderness.       Baseline ROM, no obvious new focal weakness.  Neurological:       Mental status and motor strength appears baseline for patient and situation.  Skin: No rash noted.  Psychiatric: He has a normal mood and affect.    ED Course  Procedures (including critical care time)  Labs Reviewed - No data to display No results found.   1. Abdominal pain   2. Alcohol abuse     DIAGNOSTIC STUDIES: Oxygen Saturation is 98% on room air, norma; by my interpretation.    COORDINATION OF CARE: 7:43pm:  - Patient evaluated by ED physician, Dilaudid, Reglan, Zofran, Percocet ordered     MDM  I personally performed the services described in this documentation, which was scribed in my presence. The recorded information has been reviewed and considered. Pt stable in ED with no significant deterioration in condition.Patient / Family / Caregiver informed of clinical course, understand medical decision-making process, and agree with plan.      Hurman Horn, MD 04/24/12 [phone]

## 2012-04-20 NOTE — ED Notes (Signed)
Pt c/o abd pain from chronic pancreatitis.

## 2012-04-20 NOTE — ED Notes (Signed)
Pt was seen this am; DX with  Pancreatis; po pain meds ineffective

## 2012-04-20 NOTE — ED Notes (Signed)
Pt reporting he does continue to have pain.  Explained to pt that he was given pain medication within last 10 minutes and that the medication had not had time to reach peak effectiveness.  Pt verbalized understanding.

## 2012-04-21 MED FILL — Oxycodone w/ Acetaminophen Tab 5-325 MG: ORAL | Qty: 6 | Status: AC

## 2012-05-28 ENCOUNTER — Emergency Department (HOSPITAL_COMMUNITY)
Admission: EM | Admit: 2012-05-28 | Discharge: 2012-05-28 | Disposition: A | Discharge status: Home / Self Care | Payer: Medicaid Other | Attending: Emergency Medicine | Admitting: Emergency Medicine

## 2012-05-28 ENCOUNTER — Encounter (HOSPITAL_COMMUNITY): Payer: Self-pay | Admitting: Emergency Medicine

## 2012-05-28 ENCOUNTER — Emergency Department (HOSPITAL_COMMUNITY): Payer: Medicaid Other

## 2012-05-28 DIAGNOSIS — R0789 Other chest pain: Secondary | ICD-10-CM

## 2012-05-28 DIAGNOSIS — R071 Chest pain on breathing: Secondary | ICD-10-CM | POA: Insufficient documentation

## 2012-05-28 DIAGNOSIS — F17200 Nicotine dependence, unspecified, uncomplicated: Secondary | ICD-10-CM | POA: Insufficient documentation

## 2012-05-28 DIAGNOSIS — J449 Chronic obstructive pulmonary disease, unspecified: Secondary | ICD-10-CM | POA: Insufficient documentation

## 2012-05-28 DIAGNOSIS — J4489 Other specified chronic obstructive pulmonary disease: Secondary | ICD-10-CM | POA: Insufficient documentation

## 2012-05-28 MED ORDER — OXYCODONE-ACETAMINOPHEN 5-325 MG PO TABS
1.0000 | ORAL_TABLET | Freq: Once | ORAL | Status: AC
  Administered 2012-05-28: 1 via ORAL

## 2012-05-28 MED ORDER — OXYCODONE-ACETAMINOPHEN 5-325 MG PO TABS
ORAL_TABLET | ORAL | Status: AC
  Filled 2012-05-28: qty 1

## 2012-05-28 NOTE — ED Provider Notes (Signed)
History   This chart was scribed for Jose Gaskins, MD by Charolett Bumpers . The patient was seen in room APA03/APA03. Patient's care was started at 0736.    CSN: [id number]  Arrival date & time 05/28/12  0865   First MD Initiated Contact with Patient 05/28/12 [phone]      Chief Complaint  Patient presents with  . Chest Pain    HPI [name] is a 36 y.o. male who presents to the Emergency Department complaining of constant, moderate left-sided chest pain with an onset of yesterday. Pt states that he was bending over when he heard a "popping" noise. Pt denies any recent falls. Pt denies any radiation of pain. Pt states that his symptoms are aggravated with movement. Pt denies any h/o surgeries on his chest.   Past Medical History  Diagnosis Date  . Pancreatitis   . Seizures   . Back pain   . ETOH abuse   . COPD (chronic obstructive pulmonary disease)   . SAH (subarachnoid hemorrhage)   . Macrocytosis 08/17/2011  . Bradycardia 08/17/2011    Past Surgical History  Procedure Date  . Back surgery   . Septoplasty     Family History  Problem Relation Age of Onset  . Seizures Father   . Alcohol abuse Father   . Coronary artery disease Mother     deceased age 3    History  Substance Use Topics  . Smoking status: Current Everyday Smoker -- 1.0 packs/day    Types: Cigarettes  . Smokeless tobacco: Not on file  . Alcohol Use: Yes     every day      Review of Systems  Constitutional: Negative for fever.  Respiratory: Negative for shortness of breath.   Cardiovascular: Positive for chest pain. Negative for leg swelling.  Gastrointestinal: Negative for nausea, vomiting and abdominal pain.  Musculoskeletal: Negative for back pain.  All other systems reviewed and are negative.    Allergies  Review of patient's allergies indicates no known allergies.  Home Medications   Current Outpatient Rx  Name Route Sig Dispense Refill  . CARBAMAZEPINE 200 MG PO  TABS Oral Take 200-400 mg by mouth 3 (three) times daily. Take 2 tablets in the morning, 1 tablet at lunch and 2 tablets at bedtime.    Marland Kitchen LEVETIRACETAM 500 MG PO TABS Oral Take 500 mg by mouth 2 (two) times daily.     . OXYCODONE-ACETAMINOPHEN 5-500 MG PO CAPS Oral Take 2 capsules by mouth every 6 (six) hours as needed. For pain    . PROMETHAZINE HCL 25 MG PO TABS Oral Take 0.5 tablets (12.5 mg total) by mouth every 6 (six) hours as needed for nausea. 10 tablet 0  . PROMETHAZINE HCL 25 MG PO TABS Oral Take 1 tablet (25 mg total) by mouth every 6 (six) hours as needed for nausea. 12 tablet 0  . PROMETHAZINE HCL 25 MG PO TABS Oral Take 1 tablet (25 mg total) by mouth every 6 (six) hours as needed for nausea. 12 tablet 0    BP 147/102  Pulse 97  Temp 98.1 F (36.7 C)  Resp 19  SpO2 94%  Physical Exam CONSTITUTIONAL: Well developed/well nourished HEAD AND FACE: Normocephalic/atraumatic EYES: EOMI/PERRL ENMT: Mucous membranes moist NECK: supple no meningeal signs SPINE:entire spine nontender CV: S1/S2 noted, no murmurs/rubs/gallops noted LUNGS: no apparent distress, coarse breath sounds. Left sided chest wall tenderness.  No crepitance ABDOMEN: soft, nontender, no rebound or guarding GU:no cva tenderness  NEURO: Pt is awake/alert, moves all extremitiesx4, able to ambulate without difficulty EXTREMITIES: pulses normal, full ROM SKIN: warm, color normal PSYCH: no abnormalities of mood noted  ED Course  Procedures   DIAGNOSTIC STUDIES: Oxygen Saturation is 94% on room air, normal by my interpretation.    COORDINATION OF CARE:  07:51-Discussed planned course of treatment with the patient, who is agreeable at this time.   08:00-Medication Orders: Oxycodone-acetaminophen (Percocet/Roxicet) 5-325 mg per tablet 1 tablet-once   cxr reviewed, no acute injury, patient stable for d/c.  He had point tenderness, I doubt ACS/PE/Dissection at this time  MDM  Nursing notes including past  medical history and social history reviewed and considered in documentation xrays reviewed and considered Narcotic database reviewed     Date: 05/28/2012  Rate: 88  Rhythm: normal sinus rhythm  QRS Axis: normal  Intervals: normal  ST/T Wave abnormalities: nonspecific ST changes  Conduction Disutrbances:nonspecific intraventricular conduction delay  Narrative Interpretation:   Old EKG Reviewed: unchanged     I personally performed the services described in this documentation, which was scribed in my presence. The recorded information has been reviewed and considered.        Jose Gaskins, MD 05/28/12 [phone]

## 2012-05-28 NOTE — ED Notes (Signed)
Pt leaning over and heard a pop to L side of ribs under arm. Denies radiation. Worse with movement. Nad. Denies injury.

## 2012-05-28 NOTE — ED Notes (Signed)
Pt reports pain to his left rib area.  Pt denies any sob.

## 2012-06-03 ENCOUNTER — Encounter (HOSPITAL_COMMUNITY): Payer: Self-pay

## 2012-06-03 ENCOUNTER — Emergency Department (HOSPITAL_COMMUNITY)
Admission: EM | Admit: 2012-06-03 | Discharge: 2012-06-03 | Disposition: A | Discharge status: Home / Self Care | Payer: Medicaid Other | Attending: Emergency Medicine | Admitting: Emergency Medicine

## 2012-06-03 ENCOUNTER — Emergency Department (HOSPITAL_COMMUNITY): Payer: Medicaid Other

## 2012-06-03 DIAGNOSIS — F17200 Nicotine dependence, unspecified, uncomplicated: Secondary | ICD-10-CM | POA: Insufficient documentation

## 2012-06-03 DIAGNOSIS — J449 Chronic obstructive pulmonary disease, unspecified: Secondary | ICD-10-CM | POA: Insufficient documentation

## 2012-06-03 DIAGNOSIS — R0789 Other chest pain: Secondary | ICD-10-CM

## 2012-06-03 DIAGNOSIS — J189 Pneumonia, unspecified organism: Secondary | ICD-10-CM | POA: Insufficient documentation

## 2012-06-03 DIAGNOSIS — R071 Chest pain on breathing: Secondary | ICD-10-CM | POA: Insufficient documentation

## 2012-06-03 DIAGNOSIS — J4489 Other specified chronic obstructive pulmonary disease: Secondary | ICD-10-CM | POA: Insufficient documentation

## 2012-06-03 DIAGNOSIS — G8929 Other chronic pain: Secondary | ICD-10-CM | POA: Insufficient documentation

## 2012-06-03 MED ORDER — IBUPROFEN 800 MG PO TABS: 800.0000 mg | ORAL_TABLET | Freq: Three times a day (TID) | ORAL | Status: AC

## 2012-06-03 MED ORDER — AZITHROMYCIN 250 MG PO TABS
500.0000 mg | ORAL_TABLET | Freq: Once | ORAL | Status: AC
  Administered 2012-06-03: 500 mg via ORAL
  Filled 2012-06-03: qty 2

## 2012-06-03 MED ORDER — IBUPROFEN 800 MG PO TABS
800.0000 mg | ORAL_TABLET | Freq: Once | ORAL | Status: AC
  Administered 2012-06-03: 800 mg via ORAL
  Filled 2012-06-03: qty 1

## 2012-06-03 MED ORDER — AZITHROMYCIN 250 MG PO TABS: 250.0000 mg | ORAL_TABLET | Freq: Every day | ORAL | Status: AC

## 2012-06-03 NOTE — ED Provider Notes (Signed)
History    This chart was scribed for Charles B. Bernette Mayers, MD, MD by Smitty Pluck. The patient was seen in room APA08 and the patient's care was started at 10:19AM.   CSN: [id number]  Arrival date & time 06/03/12  1000   First MD Initiated Contact with Patient 06/03/12 1010      Chief Complaint  Patient presents with  . Rib Injury    (Consider location/radiation/quality/duration/timing/severity/associated sxs/prior treatment) The history is provided by the patient.   [name] is a 36 y.o. male who presents to the Emergency Department complaining of constant, moderate left rib pain onset 1.5 weeks ago. Pt reports that he has popping when he leans over. Pt was in ED for same symptoms on 05/28/12. Pt reports that the pain medication (percocet) he takes at home does not provide relief. Denies radiation. Denies fever, chills, and SOB. Denies change in cough. Pt reports that he has not been to PCP for this complaint. Pt reports that he smokes cigarettes.  PCP is Dr. Janna Arch  Past Medical History  Diagnosis Date  . Pancreatitis   . Seizures   . Back pain   . ETOH abuse   . COPD (chronic obstructive pulmonary disease)   . SAH (subarachnoid hemorrhage)   . Macrocytosis 08/17/2011  . Bradycardia 08/17/2011    Past Surgical History  Procedure Date  . Back surgery   . Septoplasty     Family History  Problem Relation Age of Onset  . Seizures Father   . Alcohol abuse Father   . Coronary artery disease Mother     deceased age 75    History  Substance Use Topics  . Smoking status: Current Everyday Smoker -- 1.0 packs/day    Types: Cigarettes  . Smokeless tobacco: Not on file  . Alcohol Use: Yes     every day      Review of Systems  All other systems reviewed and are negative.   10 Systems reviewed and all are negative for acute change except as noted in the HPI.   Allergies  Review of patient's allergies indicates no known allergies.  Home Medications    Current Outpatient Rx  Name Route Sig Dispense Refill  . CARBAMAZEPINE 200 MG PO TABS Oral Take 200-400 mg by mouth 3 (three) times daily. Take 2 tablets in the morning, 1 tablet at lunch and 2 tablets at bedtime.    Marland Kitchen LEVETIRACETAM 500 MG PO TABS Oral Take 500 mg by mouth 2 (two) times daily.     . OXYCODONE-ACETAMINOPHEN 5-500 MG PO CAPS Oral Take 2 capsules by mouth every 6 (six) hours as needed. For pain    . PROMETHAZINE HCL 25 MG PO TABS Oral Take 0.5 tablets (12.5 mg total) by mouth every 6 (six) hours as needed for nausea. 10 tablet 0  . PROMETHAZINE HCL 25 MG PO TABS Oral Take 1 tablet (25 mg total) by mouth every 6 (six) hours as needed for nausea. 12 tablet 0  . PROMETHAZINE HCL 25 MG PO TABS Oral Take 1 tablet (25 mg total) by mouth every 6 (six) hours as needed for nausea. 12 tablet 0    BP 136/98  Pulse 80  Temp 98.3 F (36.8 C) (Oral)  Resp 20  Ht 5\' 9"  (1.753 m)  Wt 120 lb (54.432 kg)  BMI 17.72 kg/m2  SpO2 99%  Physical Exam  Nursing note and vitals reviewed. Constitutional: He is oriented to person, place, and time. He appears well-developed and  well-nourished.  HENT:  Head: Normocephalic and atraumatic.  Eyes: EOM are normal. Pupils are equal, round, and reactive to light.  Neck: Normal range of motion. Neck supple.  Cardiovascular: Normal rate, normal heart sounds and intact distal pulses.   Pulmonary/Chest: Effort normal. He has wheezes (faint bilaterally). He exhibits tenderness (left lateral ribs).  Abdominal: Bowel sounds are normal. He exhibits no distension. There is no tenderness.  Musculoskeletal: Normal range of motion. He exhibits no edema and no tenderness.  Neurological: He is alert and oriented to person, place, and time. He has normal strength. No cranial nerve deficit or sensory deficit.  Skin: Skin is warm and dry. No rash noted.  Psychiatric: He has a normal mood and affect.    ED Course  Procedures (including critical care  time) DIAGNOSTIC STUDIES: Oxygen Saturation is 99% on room air, normal by my interpretation.    COORDINATION OF CARE: 10:25AM EDP discusses pt ED treatment with pt  10:25AM EDP ordered medication:  Scheduled Meds:   . ibuprofen  800 mg Oral Once   Continuous Infusions:  PRN Meds:.      Labs Reviewed - No data to display Dg Ribs Unilateral W/chest Left  06/03/2012  *RADIOLOGY REPORT*  Clinical Data: Left rib pain.  Rib injury 2 weeks ago.  Smoker, cough.  LEFT RIBS AND CHEST - 3+ VIEW  Comparison: 05/28/2012  Findings: No definite acute or healing rib fracture is visualized. Left lung is clear.  No effusion or pneumothorax.  Heart is normal size.  Patchy rounded opacity in the right upper lobe, new since prior study.  Cannot exclude early infiltrate.  Recommend clinical correlation for signs and symptoms of pneumonia.  Given the rounded appearance, follow-up chest x-ray is recommended to assure resolution.  IMPRESSION: No obvious acute or healing rib fracture.  New rounded opacity projects over the right upper lobe, question early infiltrate/pneumonia.  Recommend follow up in 1-2 months to assure resolution.  Original Report Authenticated By: Cyndie Chime, M.D.     1. Chest wall pain   2. CAP (community acquired pneumonia)   3. Chronic pain       MDM  Pt with chronic pain has had atraumatic L rib pain for over a week. Seen in the ED for same and had neg xray, but per radiologist report recommended followup to rule out occult fracture. Pt states percocet has not been helping. Advised that NSAIDs are more effective for rib pain. Given Motrin 800mg .   I personally performed the services described in the documentation, which were scribed in my presence. The recorded information has been reviewed and considered.  CXR as above, neg for fracture, but concerning for developing pneumonia. Given Rx for zithromax. Advised PCP followup. Has oxycodone at home.          Charles B.  Bernette Mayers, MD 06/03/12 [phone]

## 2012-06-03 NOTE — ED Notes (Signed)
Pt reports having rib pain for 2 weeks, was seen in er  For same, feels like ribs are moving and his meds for pain is not working

## 2012-07-09 ENCOUNTER — Emergency Department (HOSPITAL_COMMUNITY)
Admission: EM | Admit: 2012-07-09 | Discharge: 2012-07-09 | Disposition: A | Discharge status: Home / Self Care | Payer: Medicaid Other | Source: Home / Self Care | Attending: Emergency Medicine | Admitting: Emergency Medicine

## 2012-07-09 ENCOUNTER — Encounter (HOSPITAL_COMMUNITY): Payer: Self-pay | Admitting: *Deleted

## 2012-07-09 ENCOUNTER — Emergency Department (HOSPITAL_COMMUNITY)
Admission: EM | Admit: 2012-07-09 | Discharge: 2012-07-09 | Disposition: A | Discharge status: Home / Self Care | Payer: Medicaid Other | Attending: Emergency Medicine | Admitting: Emergency Medicine

## 2012-07-09 ENCOUNTER — Encounter (HOSPITAL_COMMUNITY): Payer: Self-pay | Admitting: Emergency Medicine

## 2012-07-09 DIAGNOSIS — J4489 Other specified chronic obstructive pulmonary disease: Secondary | ICD-10-CM | POA: Insufficient documentation

## 2012-07-09 DIAGNOSIS — Z8719 Personal history of other diseases of the digestive system: Secondary | ICD-10-CM

## 2012-07-09 DIAGNOSIS — F17200 Nicotine dependence, unspecified, uncomplicated: Secondary | ICD-10-CM | POA: Insufficient documentation

## 2012-07-09 DIAGNOSIS — K861 Other chronic pancreatitis: Secondary | ICD-10-CM

## 2012-07-09 DIAGNOSIS — J449 Chronic obstructive pulmonary disease, unspecified: Secondary | ICD-10-CM | POA: Insufficient documentation

## 2012-07-09 DIAGNOSIS — R109 Unspecified abdominal pain: Secondary | ICD-10-CM

## 2012-07-09 LAB — COMPREHENSIVE METABOLIC PANEL
Albumin: 3.4 g/dL — ABNORMAL LOW (ref 3.5–5.2)
Alkaline Phosphatase: 114 U/L (ref 39–117)
BUN: 4 mg/dL — ABNORMAL LOW (ref 6–23)
CO2: 24 mEq/L (ref 19–32)
Chloride: 97 mEq/L (ref 96–112)
Creatinine, Ser: 0.35 mg/dL — ABNORMAL LOW (ref 0.50–1.35)
GFR calc Af Amer: >90 mL/min (ref >90)
GFR calc non Af Amer: >90 mL/min (ref >90)
Glucose, Bld: 91 mg/dL (ref 70–99)
Potassium: 3.4 mEq/L — ABNORMAL LOW (ref 3.5–5.1)
Total Bilirubin: 0.5 mg/dL (ref 0.3–1.2)

## 2012-07-09 LAB — CBC WITH DIFFERENTIAL/PLATELET
HCT: 37.6 % — ABNORMAL LOW (ref 39.0–52.0)
Hemoglobin: 13.2 g/dL (ref 13.0–17.0)
Lymphocytes Relative: 27 % (ref 12–46)
Lymphs Abs: 1.8 10*3/uL (ref 0.7–4.0)
MCHC: 35.1 g/dL (ref 30.0–36.0)
Monocytes Absolute: 0.8 10*3/uL (ref 0.1–1.0)
Monocytes Relative: 12 % (ref 3–12)
Neutro Abs: 3.8 10*3/uL (ref 1.7–7.7)
Neutrophils Relative %: 59 % (ref 43–77)
RBC: 3.37 MIL/uL — ABNORMAL LOW (ref 4.22–5.81)

## 2012-07-09 LAB — LIPASE, BLOOD: Lipase: 489 U/L — ABNORMAL HIGH (ref 11–59)

## 2012-07-09 MED ORDER — HYDROMORPHONE HCL PF 2 MG/ML IJ SOLN
2.0000 mg | Freq: Once | INTRAMUSCULAR | Status: AC
  Administered 2012-07-09: 2 mg via INTRAVENOUS

## 2012-07-09 MED ORDER — HYDROMORPHONE HCL PF 1 MG/ML IJ SOLN
1.0000 mg | Freq: Once | INTRAMUSCULAR | Status: AC
  Administered 2012-07-09: 1 mg via INTRAVENOUS
  Filled 2012-07-09: qty 1

## 2012-07-09 MED ORDER — KETOROLAC TROMETHAMINE 30 MG/ML IJ SOLN
30.0000 mg | Freq: Once | INTRAMUSCULAR | Status: AC
  Administered 2012-07-09: 30 mg via INTRAVENOUS
  Filled 2012-07-09: qty 1

## 2012-07-09 MED ORDER — HYDROMORPHONE HCL PF 1 MG/ML IJ SOLN
2.0000 mg | Freq: Once | INTRAMUSCULAR | Status: AC
  Administered 2012-07-09: 2 mg via INTRAVENOUS
  Filled 2012-07-09: qty 2

## 2012-07-09 MED ORDER — SODIUM CHLORIDE 0.9 % IV BOLUS (SEPSIS)
1000.0000 mL | Freq: Once | INTRAVENOUS | Status: AC
  Administered 2012-07-09: 1000 mL via INTRAVENOUS

## 2012-07-09 MED ORDER — SODIUM CHLORIDE 0.9 % IV SOLN: INTRAVENOUS | Status: DC

## 2012-07-09 MED ORDER — HYDROCODONE-ACETAMINOPHEN 7.5-500 MG/15ML PO SOLN: 7.5000 mL | Freq: Four times a day (QID) | ORAL | Status: DC | PRN

## 2012-07-09 MED ORDER — HYDROMORPHONE HCL PF 2 MG/ML IJ SOLN
INTRAMUSCULAR | Status: AC
  Administered 2012-07-09: 2 mg via INTRAVENOUS
  Filled 2012-07-09: qty 1

## 2012-07-09 MED ORDER — ONDANSETRON HCL 4 MG/2ML IJ SOLN
4.0000 mg | Freq: Once | INTRAMUSCULAR | Status: AC
  Administered 2012-07-09: 4 mg via INTRAVENOUS
  Filled 2012-07-09: qty 2

## 2012-07-09 NOTE — ED Notes (Signed)
Pt demanding additional pain medication, stating that what was given isn't working.  Explained to pt that since he received medication within past 10 minutes, it has not had time to begin to be effective.  Reinforced with pt that he needs to allow adequate time for medication to enter his system.

## 2012-07-09 NOTE — ED Notes (Signed)
Patient c/o abdominal pain for 2-3 days; denies nausea, vomiting or diarrhea.

## 2012-07-09 NOTE — ED Notes (Addendum)
Pt reports generalized abdominal pain for 2-3 days.  States "I'm sure it's an alcohol related problem."  Reports last BM today.  Denies nausea, vomiting, constipation or diarrhea.  Pt states he has not been seeing physician or following up with GI specialist.

## 2012-07-09 NOTE — ED Notes (Addendum)
Pt reporting continued pain.  Reports only minor improvement.  Rates 8/10

## 2012-07-09 NOTE — ED Notes (Signed)
abd pain for 3 days,  No vomiting, Seen here last night for same.

## 2012-07-09 NOTE — ED Provider Notes (Addendum)
History     CSN: [id number]  Arrival date & time 07/09/12  1845   First MD Initiated Contact with Patient 07/09/12 1943      Chief Complaint  Patient presents with  . Abdominal Pain    (Consider location/radiation/quality/duration/timing/severity/associated sxs/prior treatment) Patient is a 36 y.o. male presenting with abdominal pain. The history is provided by the patient.  Abdominal Pain The primary symptoms of the illness include abdominal pain. The primary symptoms of the illness do not include fever, shortness of breath, nausea, vomiting, diarrhea or dysuria.  Symptoms associated with the illness do not include back pain.   patient with history of chronic abdominal pain due to chronic pancreatitis and history of alcohol substance abuse. Patient was just seen earlier this morning for the pain of received IV fluids pain control was discharged home, with pain medication. Patient also has pain medication at home. His back state and a central controlling the pain. No nausea no vomiting he is able to drink liquids fine. The belly pain is epigastric rated 10 out of 10. Does not radiate to his back. She described as sharp. Patient currently does not have a primary care doctor however he has been referred to followup with Dr. Delbert Harness.  Past Medical History  Diagnosis Date  . Pancreatitis   . Seizures   . Back pain   . ETOH abuse   . COPD (chronic obstructive pulmonary disease)   . SAH (subarachnoid hemorrhage)   . Macrocytosis 08/17/2011  . Bradycardia 08/17/2011    Past Surgical History  Procedure Date  . Back surgery   . Septoplasty     Family History  Problem Relation Age of Onset  . Seizures Father   . Alcohol abuse Father   . Coronary artery disease Mother     deceased age 97    History  Substance Use Topics  . Smoking status: Current Every Day Smoker -- 1.0 packs/day    Types: Cigarettes  . Smokeless tobacco: Not on file  . Alcohol Use: Yes     every day       Review of Systems  Constitutional: Negative for fever.  HENT: Negative for congestion and neck pain.   Eyes: Negative for redness.  Respiratory: Negative for shortness of breath.   Cardiovascular: Negative for chest pain.  Gastrointestinal: Positive for abdominal pain. Negative for nausea, vomiting and diarrhea.  Genitourinary: Negative for dysuria.  Musculoskeletal: Negative for back pain.  Skin: Negative for rash.  Neurological: Negative for headaches.  Hematological: Does not bruise/bleed easily.  Psychiatric/Behavioral: Negative for confusion.    Allergies  Review of patient's allergies indicates no known allergies.  Home Medications   Current Outpatient Rx  Name Route Sig Dispense Refill  . CARBAMAZEPINE 200 MG PO TABS Oral Take 200-400 mg by mouth 3 (three) times daily. Take 2 tablets in the morning, 1 tablet at lunch and 2 tablets at bedtime.    . IBUPROFEN 200 MG PO TABS Oral Take 800 mg by mouth once as needed. FOR PAIN    . LEVETIRACETAM 500 MG PO TABS Oral Take 500 mg by mouth 2 (two) times daily.     . OXYCODONE-ACETAMINOPHEN 5-500 MG PO CAPS Oral Take 2 capsules by mouth every 6 (six) hours as needed. For pain    . HYDROCODONE-ACETAMINOPHEN 7.5-500 MG/15ML PO SOLN Oral Take 7.5 mLs by mouth every 6 (six) hours as needed for pain. 60 mL 0    BP 140/77  Pulse 80  Temp 97.9  F (36.6 C) (Oral)  Resp 20  Ht 5\' 8"  (1.727 m)  Wt 120 lb (54.432 kg)  BMI 18.25 kg/m2  SpO2 100%  Physical Exam  Nursing note and vitals reviewed. Constitutional: He is oriented to person, place, and time. He appears well-developed and well-nourished. No distress.  HENT:  Head: Normocephalic and atraumatic.  Mouth/Throat: Oropharynx is clear and moist.  Eyes: Conjunctivae normal and EOM are normal. Pupils are equal, round, and reactive to light. No scleral icterus.  Neck: Normal range of motion. Neck supple.  Cardiovascular: Normal rate, regular rhythm and normal heart sounds.    No murmur heard. Pulmonary/Chest: Effort normal. No respiratory distress. He has no wheezes. He has no rales.  Abdominal: Soft. Bowel sounds are normal. There is tenderness. There is no guarding.  Musculoskeletal: Normal range of motion. He exhibits no edema.  Lymphadenopathy:    He has no cervical adenopathy.  Neurological: He is alert and oriented to person, place, and time. No cranial nerve deficit. He exhibits normal muscle tone. Coordination normal.  Skin: Skin is warm. No rash noted.    ED Course  Procedures (including critical care time)  Labs Reviewed  CBC WITH DIFFERENTIAL - Abnormal; Notable for the following:    RBC 3.37 (*)     HCT 37.6 (*)     MCV 111.6 (*)     MCH 39.2 (*)     Platelets 139 (*)     All other components within normal limits  COMPREHENSIVE METABOLIC PANEL - Abnormal; Notable for the following:    Sodium 131 (*)     Potassium 3.4 (*)     BUN 4 (*)     Creatinine, Ser 0.35 (*)     Albumin 3.4 (*)     All other components within normal limits  LIPASE, BLOOD - Abnormal; Notable for the following:    Lipase 489 (*)     All other components within normal limits   No results found. Results for orders placed during the hospital encounter of 07/09/12  CBC WITH DIFFERENTIAL      Component Value Range   WBC 6.5  4.0 - 10.5 K/uL   RBC 3.37 (*) 4.22 - 5.81 MIL/uL   Hemoglobin 13.2  13.0 - 17.0 g/dL   HCT 40.9 (*) 81.1 - 91.4 %   MCV 111.6 (*) 78.0 - 100.0 fL   MCH 39.2 (*) 26.0 - 34.0 pg   MCHC 35.1  30.0 - 36.0 g/dL   RDW 78.2  95.6 - 21.3 %   Platelets 139 (*) 150 - 400 K/uL   Neutrophils Relative 59  43 - 77 %   Neutro Abs 3.8  1.7 - 7.7 K/uL   Lymphocytes Relative 27  12 - 46 %   Lymphs Abs 1.8  0.7 - 4.0 K/uL   Monocytes Relative 12  3 - 12 %   Monocytes Absolute 0.8  0.1 - 1.0 K/uL   Eosinophils Relative 2  0 - 5 %   Eosinophils Absolute 0.1  0.0 - 0.7 K/uL   Basophils Relative 0  0 - 1 %   Basophils Absolute 0.0  0.0 - 0.1 K/uL   COMPREHENSIVE METABOLIC PANEL      Component Value Range   Sodium 131 (*) 135 - 145 mEq/L   Potassium 3.4 (*) 3.5 - 5.1 mEq/L   Chloride 97  96 - 112 mEq/L   CO2 24  19 - 32 mEq/L   Glucose, Bld 91  70 -  99 mg/dL   BUN 4 (*) 6 - 23 mg/dL   Creatinine, Ser 1.30 (*) 0.50 - 1.35 mg/dL   Calcium 8.5  8.4 - 86.5 mg/dL   Total Protein 6.2  6.0 - 8.3 g/dL   Albumin 3.4 (*) 3.5 - 5.2 g/dL   AST 29  0 - 37 U/L   ALT 15  0 - 53 U/L   Alkaline Phosphatase 114  39 - 117 U/L   Total Bilirubin 0.5  0.3 - 1.2 mg/dL   GFR calc non Af Amer >90  >90 mL/min   GFR calc Af Amer >90  >90 mL/min  LIPASE, BLOOD      Component Value Range   Lipase 489 (*) 11 - 59 U/L     1. Chronic abdominal pain   2. Chronic pancreatitis       MDM  Patient with history of chronic abdominal pain related to chronic pancreatitis due to alcohol abuse. Patient was seen in the emergency department earlier this morning received IV fluids and pain control discharged home. Patient's back today stating that they some trouble controlling his pain he is unable to get medications filled because he is at his limit and the pharmacy won't fill any more. Patient denies any nausea or vomiting is able to drink liquids. Patient's liver function tests without significant Dolphus Jenny is no elevation in the bilirubin no significant elevation in AST or ALT or alkaline phosphatase. No leukocytosis no significant anemia.  Lipase is pending.  Patient's pain is improved with 2 mg of IV and hydromorphone in the emergency department and patient also given another liter of fluids.  Patient's lipase is elevated at 489 consistent with the pancreatitis. However patient is tolerating by mouth fine so he can be discharged home admission not required. Recommended patient stay on a liquid diet for the next 2 days and then advance slowly to a bland diet. Patient will return for new or worse symptoms.         Shelda Jakes, MD 07/09/12  [id number]  Shelda Jakes, MD 07/10/12 [phone]

## 2012-07-09 NOTE — ED Notes (Signed)
Pt stating that pain medication has not worked and he is still in as much pain as he was when he arrived to department.  Again reinforced with pt that the pain medication was administered 10 minutes ago and needed time to begin to become effective.

## 2012-07-09 NOTE — ED Provider Notes (Signed)
History     CSN: [id number]  Arrival date & time 07/09/12  0223   First MD Initiated Contact with Patient 07/09/12 0234      Chief Complaint  Patient presents with  . Abdominal Pain    (Consider location/radiation/quality/duration/timing/severity/associated sxs/prior treatment) HPI Hx per PT.  LUQ pain No radiation Sharp described as "hurts like hell" Onset few days ago and worse tonight after drinking beer - [employer] liquor Duration 3 days Timing is constant waxes and wanes Mod to severe pain Has associated nausea, no vomiting or diarrhea. Last BM yesterday no blood.  H/O pancreatitis that feels the same, related to etoh use. No h/o GB problems. No RUQ pain. No F/C. Drinks etoh every day. No interested in program for stopping alcohol use.   Past Medical History  Diagnosis Date  . Pancreatitis   . Seizures   . Back pain   . ETOH abuse   . COPD (chronic obstructive pulmonary disease)   . SAH (subarachnoid hemorrhage)   . Macrocytosis 08/17/2011  . Bradycardia 08/17/2011    Past Surgical History  Procedure Date  . Back surgery   . Septoplasty     Family History  Problem Relation Age of Onset  . Seizures Father   . Alcohol abuse Father   . Coronary artery disease Mother     deceased age 42    History  Substance Use Topics  . Smoking status: Current Every Day Smoker -- 1.0 packs/day    Types: Cigarettes  . Smokeless tobacco: Not on file  . Alcohol Use: Yes     every day      Review of Systems  Constitutional: Negative for fever and chills.  HENT: Negative for neck pain and neck stiffness.   Eyes: Negative for pain.  Respiratory: Negative for shortness of breath.   Cardiovascular: Negative for chest pain.  Gastrointestinal: Positive for nausea and abdominal pain.  Genitourinary: Negative for dysuria.  Musculoskeletal: Negative for back pain.  Skin: Negative for rash.  Neurological: Negative for headaches.  All other systems reviewed and are  negative.    Allergies  Review of patient's allergies indicates no known allergies.  Home Medications   Current Outpatient Rx  Name Route Sig Dispense Refill  . CARBAMAZEPINE 200 MG PO TABS Oral Take 200-400 mg by mouth 3 (three) times daily. Take 2 tablets in the morning, 1 tablet at lunch and 2 tablets at bedtime.    Marland Kitchen LEVETIRACETAM 500 MG PO TABS Oral Take 500 mg by mouth 2 (two) times daily.     . OXYCODONE-ACETAMINOPHEN 5-500 MG PO CAPS Oral Take 2 capsules by mouth every 6 (six) hours as needed. For pain      BP 114/69  Pulse 112  Temp 98.1 F (36.7 C) (Oral)  Resp 22  Ht 5\' 8"  (1.727 m)  Wt 120 lb (54.432 kg)  BMI 18.25 kg/m2  SpO2 97%  Physical Exam  Constitutional: He is oriented to person, place, and time. He appears well-developed and well-nourished.  HENT:  Head: Normocephalic and atraumatic.  Eyes: Conjunctivae normal and EOM are normal. Pupils are equal, round, and reactive to light.  Neck: Trachea normal. Neck supple. No thyromegaly present.  Cardiovascular: Normal rate, regular rhythm, S1 normal, S2 normal and normal pulses.     No systolic murmur is present   No diastolic murmur is present  Pulses:      Radial pulses are 2+ on the right side, and 2+ on the left side.  Pulmonary/Chest: Effort normal and breath sounds normal. He has no wheezes. He has no rhonchi. He has no rales. He exhibits no tenderness.  Abdominal: Soft. Normal appearance and bowel sounds are normal. He exhibits no mass. There is tenderness. There is no rebound, no CVA tenderness and negative Murphy's sign.       TTP LUQ vol guadring, no acute ABD  Musculoskeletal:       BLE:s Calves nontender, no cords or erythema, negative Homans sign  Neurological: He is alert and oriented to person, place, and time. He has normal strength. No cranial nerve deficit or sensory deficit. GCS eye subscore is 4. GCS verbal subscore is 5. GCS motor subscore is 6.  Skin: Skin is warm and dry. No rash noted.  He is not diaphoretic.  Psychiatric: His speech is normal.       Cooperative and appropriate    ED Course  Procedures (including critical care time)  IVfs. IV Dilaudid  Recheck improved but still some pain, dilaudid repeated and toradol provided, cont IVFs  Recheck after those medications, feeling much better, tolerating PO liquids. On exam no longer TTP. ABD s/nt/nd.  No indication for labs or CT scan at this time. PT states understanding need to avoid alcohol and follow up with PCP  MDM   VS and nursing notes reviewed, old records reviewed. IV narcotics, IVFs. Serial evaluations and improved condition.         Sunnie Nielsen, MD 07/09/12 [phone]

## 2012-07-09 NOTE — ED Notes (Signed)
Discussed pain management with pt.  Pt reports that his goal is to have no pain at all.  Discussed with pt about realistic pain goals, and that with chronic pancreatitis, particularly should he choose to continue drinking, he can expect to have some pain, and to determine what level is acceptable.  Also continued to reinforce with pt importance of following up with recommended physicians, and with resources to assist with recovery from alcoholism should he decide that's what he wants.

## 2012-07-11 ENCOUNTER — Encounter (HOSPITAL_COMMUNITY): Payer: Self-pay | Admitting: Emergency Medicine

## 2012-07-11 ENCOUNTER — Inpatient Hospital Stay (HOSPITAL_COMMUNITY)
Admission: EM | Admit: 2012-07-11 | Discharge: 2012-07-15 | DRG: 439 | Disposition: A | Discharge status: Home / Self Care | Payer: Medicaid Other | Attending: Family Medicine | Admitting: Family Medicine

## 2012-07-11 DIAGNOSIS — K861 Other chronic pancreatitis: Secondary | ICD-10-CM | POA: Diagnosis present

## 2012-07-11 DIAGNOSIS — R109 Unspecified abdominal pain: Secondary | ICD-10-CM

## 2012-07-11 DIAGNOSIS — F1010 Alcohol abuse, uncomplicated: Secondary | ICD-10-CM | POA: Diagnosis present

## 2012-07-11 DIAGNOSIS — R030 Elevated blood-pressure reading, without diagnosis of hypertension: Secondary | ICD-10-CM

## 2012-07-11 DIAGNOSIS — Z23 Encounter for immunization: Secondary | ICD-10-CM

## 2012-07-11 DIAGNOSIS — M51379 Other intervertebral disc degeneration, lumbosacral region without mention of lumbar back pain or lower extremity pain: Secondary | ICD-10-CM | POA: Diagnosis present

## 2012-07-11 DIAGNOSIS — Z79899 Other long term (current) drug therapy: Secondary | ICD-10-CM

## 2012-07-11 DIAGNOSIS — K852 Alcohol induced acute pancreatitis without necrosis or infection: Secondary | ICD-10-CM | POA: Diagnosis present

## 2012-07-11 DIAGNOSIS — Z720 Tobacco use: Secondary | ICD-10-CM

## 2012-07-11 DIAGNOSIS — F10239 Alcohol dependence with withdrawal, unspecified: Secondary | ICD-10-CM | POA: Diagnosis present

## 2012-07-11 DIAGNOSIS — M5137 Other intervertebral disc degeneration, lumbosacral region: Secondary | ICD-10-CM | POA: Diagnosis present

## 2012-07-11 DIAGNOSIS — J449 Chronic obstructive pulmonary disease, unspecified: Secondary | ICD-10-CM | POA: Diagnosis present

## 2012-07-11 DIAGNOSIS — K859 Acute pancreatitis without necrosis or infection, unspecified: Principal | ICD-10-CM | POA: Diagnosis present

## 2012-07-11 DIAGNOSIS — F10939 Alcohol use, unspecified with withdrawal, unspecified: Secondary | ICD-10-CM | POA: Diagnosis present

## 2012-07-11 DIAGNOSIS — K7010 Alcoholic hepatitis without ascites: Secondary | ICD-10-CM | POA: Diagnosis present

## 2012-07-11 DIAGNOSIS — G40909 Epilepsy, unspecified, not intractable, without status epilepticus: Secondary | ICD-10-CM | POA: Diagnosis present

## 2012-07-11 DIAGNOSIS — E876 Hypokalemia: Secondary | ICD-10-CM | POA: Diagnosis present

## 2012-07-11 DIAGNOSIS — J4489 Other specified chronic obstructive pulmonary disease: Secondary | ICD-10-CM | POA: Diagnosis present

## 2012-07-11 DIAGNOSIS — F17200 Nicotine dependence, unspecified, uncomplicated: Secondary | ICD-10-CM | POA: Diagnosis present

## 2012-07-11 DIAGNOSIS — G8929 Other chronic pain: Secondary | ICD-10-CM | POA: Diagnosis present

## 2012-07-11 LAB — COMPREHENSIVE METABOLIC PANEL
AST: 29 U/L (ref 0–37)
Albumin: 3.6 g/dL (ref 3.5–5.2)
Alkaline Phosphatase: 121 U/L — ABNORMAL HIGH (ref 39–117)
BUN: 3 mg/dL — ABNORMAL LOW (ref 6–23)
Creatinine, Ser: 0.39 mg/dL — ABNORMAL LOW (ref 0.50–1.35)
Potassium: 3.7 mEq/L (ref 3.5–5.1)
Total Protein: 6.7 g/dL (ref 6.0–8.3)

## 2012-07-11 LAB — FERRITIN: Ferritin: 443 ng/mL — ABNORMAL HIGH (ref 22–322)

## 2012-07-11 LAB — CBC WITH DIFFERENTIAL/PLATELET
Basophils Absolute: 0 10*3/uL (ref 0.0–0.1)
Basophils Relative: 0 % (ref 0–1)
Eosinophils Absolute: 0.1 10*3/uL (ref 0.0–0.7)
Hemoglobin: 14 g/dL (ref 13.0–17.0)
MCH: 39.7 pg — ABNORMAL HIGH (ref 26.0–34.0)
MCHC: 35.3 g/dL (ref 30.0–36.0)
Monocytes Relative: 15 % — ABNORMAL HIGH (ref 3–12)
Neutrophils Relative %: 59 % (ref 43–77)
RDW: 12.7 % (ref 11.5–15.5)

## 2012-07-11 LAB — IRON AND TIBC
Iron: 58 ug/dL (ref 42–135)
TIBC: 263 ug/dL (ref 215–435)
UIBC: 205 ug/dL (ref 125–400)

## 2012-07-11 LAB — RAPID URINE DRUG SCREEN, HOSP PERFORMED
Amphetamines: NOT DETECTED
Benzodiazepines: NOT DETECTED
Opiates: NOT DETECTED

## 2012-07-11 LAB — LIPASE, BLOOD: Lipase: 273 U/L — ABNORMAL HIGH (ref 11–59)

## 2012-07-11 LAB — ETHANOL: Alcohol, Ethyl (B): <11 mg/dL (ref 0–11)

## 2012-07-11 MED ORDER — INFLUENZA VIRUS VACC SPLIT PF IM SUSP
0.5000 mL | INTRAMUSCULAR | Status: AC
  Administered 2012-07-12: 0.5 mL via INTRAMUSCULAR
  Filled 2012-07-11: qty 0.5

## 2012-07-11 MED ORDER — CARBAMAZEPINE 200 MG PO TABS
400.0000 mg | ORAL_TABLET | Freq: Two times a day (BID) | ORAL | Status: DC
  Administered 2012-07-11 – 2012-07-14 (×8): 400 mg via ORAL
  Filled 2012-07-11 (×8): qty 2

## 2012-07-11 MED ORDER — THIAMINE HCL 100 MG/ML IJ SOLN: 100.0000 mg | Freq: Every day | INTRAMUSCULAR | Status: DC

## 2012-07-11 MED ORDER — ONDANSETRON HCL 4 MG/2ML IJ SOLN: 4.0000 mg | Freq: Four times a day (QID) | INTRAMUSCULAR | Status: DC | PRN

## 2012-07-11 MED ORDER — CARBAMAZEPINE 200 MG PO TABS
200.0000 mg | ORAL_TABLET | Freq: Every day | ORAL | Status: DC
  Administered 2012-07-11 – 2012-07-14 (×4): 200 mg via ORAL
  Filled 2012-07-11 (×4): qty 1

## 2012-07-11 MED ORDER — SODIUM CHLORIDE 0.9 % IV SOLN: INTRAVENOUS | Status: AC

## 2012-07-11 MED ORDER — ONDANSETRON HCL 4 MG PO TABS: 4.0000 mg | ORAL_TABLET | Freq: Four times a day (QID) | ORAL | Status: DC | PRN

## 2012-07-11 MED ORDER — HYDROMORPHONE HCL PF 1 MG/ML IJ SOLN
INTRAMUSCULAR | Status: AC
  Administered 2012-07-11: 1 mg
  Filled 2012-07-11: qty 1

## 2012-07-11 MED ORDER — LORAZEPAM 2 MG/ML IJ SOLN
1.0000 mg | Freq: Four times a day (QID) | INTRAMUSCULAR | Status: AC | PRN
  Administered 2012-07-11 – 2012-07-13 (×6): 1 mg via INTRAVENOUS
  Filled 2012-07-11 (×6): qty 1

## 2012-07-11 MED ORDER — HYDROMORPHONE HCL PF 1 MG/ML IJ SOLN
1.0000 mg | INTRAMUSCULAR | Status: DC | PRN
  Administered 2012-07-11 – 2012-07-12 (×7): 1 mg via INTRAVENOUS
  Filled 2012-07-11 (×6): qty 1

## 2012-07-11 MED ORDER — THIAMINE HCL 100 MG/ML IJ SOLN
Freq: Once | INTRAVENOUS | Status: AC
  Administered 2012-07-11: 07:00:00 via INTRAVENOUS
  Filled 2012-07-11: qty 1000

## 2012-07-11 MED ORDER — M.V.I. ADULT IV INJ
INJECTION | INTRAVENOUS | Status: AC
  Filled 2012-07-11: qty 10

## 2012-07-11 MED ORDER — LEVETIRACETAM 500 MG PO TABS
500.0000 mg | ORAL_TABLET | Freq: Two times a day (BID) | ORAL | Status: DC
  Administered 2012-07-11 – 2012-07-14 (×8): 500 mg via ORAL
  Filled 2012-07-11 (×8): qty 1

## 2012-07-11 MED ORDER — ADULT MULTIVITAMIN W/MINERALS CH
1.0000 | ORAL_TABLET | Freq: Every day | ORAL | Status: DC
  Administered 2012-07-11 – 2012-07-14 (×4): 1 via ORAL
  Filled 2012-07-11 (×4): qty 1

## 2012-07-11 MED ORDER — HYDROMORPHONE HCL PF 1 MG/ML IJ SOLN
1.0000 mg | INTRAMUSCULAR | Status: DC | PRN
  Administered 2012-07-11: 1 mg via INTRAVENOUS
  Filled 2012-07-11 (×2): qty 1

## 2012-07-11 MED ORDER — THIAMINE HCL 100 MG/ML IJ SOLN
INTRAMUSCULAR | Status: AC
  Filled 2012-07-11: qty 2

## 2012-07-11 MED ORDER — SODIUM CHLORIDE 0.9 % IV SOLN
INTRAVENOUS | Status: AC
  Administered 2012-07-11: 125 mL/h via INTRAVENOUS

## 2012-07-11 MED ORDER — HYDROMORPHONE HCL PF 1 MG/ML IJ SOLN
1.0000 mg | Freq: Once | INTRAMUSCULAR | Status: AC
  Administered 2012-07-11: 1 mg via INTRAVENOUS
  Filled 2012-07-11: qty 1

## 2012-07-11 MED ORDER — LORAZEPAM 1 MG PO TABS
1.0000 mg | ORAL_TABLET | Freq: Four times a day (QID) | ORAL | Status: AC | PRN
  Administered 2012-07-13: 1 mg via ORAL
  Filled 2012-07-11 (×2): qty 1

## 2012-07-11 MED ORDER — CARBAMAZEPINE 200 MG PO TABS: 200.0000 mg | ORAL_TABLET | Freq: Three times a day (TID) | ORAL | Status: DC

## 2012-07-11 MED ORDER — PANTOPRAZOLE SODIUM 40 MG PO TBEC
40.0000 mg | DELAYED_RELEASE_TABLET | Freq: Two times a day (BID) | ORAL | Status: DC
  Administered 2012-07-11 – 2012-07-14 (×7): 40 mg via ORAL
  Filled 2012-07-11 (×7): qty 1

## 2012-07-11 MED ORDER — FOLIC ACID 5 MG/ML IJ SOLN
INTRAMUSCULAR | Status: AC
  Filled 2012-07-11: qty 0.2

## 2012-07-11 MED ORDER — VITAMIN B-1 100 MG PO TABS
100.0000 mg | ORAL_TABLET | Freq: Every day | ORAL | Status: DC
  Administered 2012-07-12 – 2012-07-14 (×3): 100 mg via ORAL
  Filled 2012-07-11 (×3): qty 1

## 2012-07-11 MED ORDER — ENOXAPARIN SODIUM 40 MG/0.4ML ~~LOC~~ SOLN
40.0000 mg | SUBCUTANEOUS | Status: DC
  Administered 2012-07-11 – 2012-07-14 (×4): 40 mg via SUBCUTANEOUS
  Filled 2012-07-11 (×4): qty 0.4

## 2012-07-11 MED ORDER — SODIUM CHLORIDE 0.9 % IV SOLN
Freq: Once | INTRAVENOUS | Status: AC
  Administered 2012-07-11: 03:00:00 via INTRAVENOUS

## 2012-07-11 MED ORDER — FOLIC ACID 1 MG PO TABS
1.0000 mg | ORAL_TABLET | Freq: Every day | ORAL | Status: DC
  Administered 2012-07-11 – 2012-07-14 (×4): 1 mg via ORAL
  Filled 2012-07-11 (×4): qty 1

## 2012-07-11 MED ORDER — ONDANSETRON HCL 4 MG/2ML IJ SOLN
4.0000 mg | Freq: Once | INTRAMUSCULAR | Status: AC
  Administered 2012-07-11: 4 mg via INTRAVENOUS
  Filled 2012-07-11: qty 2

## 2012-07-11 NOTE — H&P (Signed)
pcp dr Wilhemina Bonito  Chief Complaint:  abd pain  HPI: 36 y o male with etoh abuse and chronic panc comes in with 3 days of worsening epi abd pain with occasional vomiting nonbloody.  Daily etoh use still.  Knows that his chronic pancreatitis is related to his etoh abuse.  Has not been able to eat anything in days.  No fevers.    Review of Systems:  O/w neg  Past Medical History: Past Medical History  Diagnosis Date  . Pancreatitis   . Seizures   . Back pain   . ETOH abuse   . COPD (chronic obstructive pulmonary disease)   . SAH (subarachnoid hemorrhage)   . Macrocytosis 08/17/2011  . Bradycardia 08/17/2011   Past Surgical History  Procedure Date  . Back surgery   . Septoplasty     Medications: Prior to Admission medications   Medication Sig Start Date End Date Taking? Authorizing Provider  HYDROcodone-acetaminophen (LORTAB) 7.5-500 MG/15ML solution Take 7.5 mLs by mouth every 6 (six) hours as needed for pain. 07/09/12 07/19/12 Yes Sunnie Nielsen, MD  carbamazepine (TEGRETOL) 200 MG tablet Take 200-400 mg by mouth 3 (three) times daily. Take 2 tablets in the morning, 1 tablet at lunch and 2 tablets at bedtime.    Historical Provider, MD  ibuprofen (ADVIL,MOTRIN) 200 MG tablet Take 800 mg by mouth once as needed. FOR PAIN    Historical Provider, MD  levETIRAcetam (KEPPRA) 500 MG tablet Take 500 mg by mouth 2 (two) times daily.     Historical Provider, MD  oxyCODONE-acetaminophen (TYLOX) 5-500 MG per capsule Take 2 capsules by mouth every 6 (six) hours as needed. For pain    Historical Provider, MD    Allergies:  No Known Allergies  Social History:  reports that he has been smoking Cigarettes.  He has been smoking about 1 pack per day. He does not have any smokeless tobacco history on file. He reports that he drinks alcohol. He reports that he does not use illicit drugs.  Family History: Family History  Problem Relation Age of Onset  . Seizures Father   . Alcohol abuse Father     . Coronary artery disease Mother     deceased age 41    Physical Exam: Filed Vitals:   07/11/12 0236 07/11/12 0240  BP:  150/81  Pulse:  84  Temp:  97.8 F (36.6 C)  TempSrc:  Oral  Resp:  20  Height: 5\' 9"  (1.753 m)   Weight: 54.432 kg (120 lb)   SpO2:  99%   General appearance: alert, cooperative and no distress no s/sx of etoh withdrawal Lungs: clear to auscultation bilaterally Heart: regular rate and rhythm, S1, S2 normal, no murmur, click, rub or gallop Abdomen: soft, non-tender; bowel sounds normal; no masses,  no organomegaly Extremities: extremities normal, atraumatic, no cyanosis or edema Pulses: 2+ and symmetric Skin: Skin color, texture, turgor normal. No rashes or lesions Neurologic: Grossly normal    Labs on Admission:   [facility] 07/11/12 0248 07/09/12 2056  NA 130* 131*  K 3.7 3.4*  CL 95* 97  CO2 26 24  GLUCOSE 87 91  BUN 3* 4*  CREATININE 0.39* 0.35*  CALCIUM 8.9 8.5  MG -- --  PHOS -- --    Basename 07/11/12 0248 07/09/12 2056  AST 29 29  ALT 13 15  ALKPHOS 121* 114  BILITOT 0.4 0.5  PROT 6.7 6.2  ALBUMIN 3.6 3.4*    Basename 07/11/12 0248 07/09/12 2056  LIPASE 273* 489*  AMYLASE -- --    Basename 07/11/12 0248 07/09/12 2056  WBC 6.5 6.5  NEUTROABS 3.8 3.8  HGB 14.0 13.2  HCT 39.7 37.6*  MCV 112.5* 111.6*  PLT 158 139*   Radiological Exams on Admission: No results found.  Assessment/Plan 36 yo male with acute on chronic pancreatitis due to continued etoh abse Present on Admission:  .Acute alcoholic pancreatitis .Alcohol abuse, continuous .COPD (chronic obstructive pulmonary disease) .Seizure disorder .Abdominal pain  Advised to stop drinking etoh.  Place on ciwa protocol.  Cont sz meds.  Ivf.  bananna bag.  Iv pain meds and zofran.  abd exam benign.     Alaa Mullally A [phone] 07/11/2012, 4:56 AM

## 2012-07-11 NOTE — Plan of Care (Signed)
Problem: Phase I Progression Outcomes Goal: Nausea/vomiting controlled after medication Outcome: Not Applicable Date Met:  07/11/12 No c/o nausea or vomiting.

## 2012-07-11 NOTE — Progress Notes (Signed)
[id number] 

## 2012-07-11 NOTE — Care Management Note (Signed)
    Page 1 of 1   07/15/2012     11:22:25 AM   CARE MANAGEMENT NOTE 07/15/2012  Patient:  GARRELL, LAVERS   Account Number:  [id number]  Date Initiated:  07/11/2012  Documentation initiated by:  Rosemary Holms  Subjective/Objective Assessment:   Pt admitted from home where he lives with her girlfriend. Per pt, he plans to return home with girlfriend. Admitted with acute pancreatitis.     Action/Plan:   No HH needs identified at this time.   Anticipated DC Date:  07/14/2012   Anticipated DC Plan:  HOME/SELF CARE      DC Planning Services  CM consult      Choice offered to / List presented to:             Status of service:  Completed, signed off Medicare Important Message given?  NO (If response is "NO", the following Medicare IM given date fields will be blank) Date Medicare IM given:   Date Additional Medicare IM given:    Discharge Disposition:  HOME/SELF CARE  Per UR Regulation:    If discussed at Long Length of Stay Meetings, dates discussed:    Comments:  07/15/12 1116 Arlyss Queen, RN BSN CM Pt discharged home today. No CM/HH needs noted.   07/11/12 1115 Amy Leanord Hawking RN BSN CM

## 2012-07-11 NOTE — Progress Notes (Signed)
ANTICOAGULATION CONSULT NOTE - Initial Consult  Pharmacy Consult for Lovenox Indication: VTE prophylaxis  No Known Allergies  Patient Measurements: Height: 5\' 9"  (175.3 cm) Weight: 119 lb 15.9 oz (54.43 kg) IBW/kg (Calculated) : 70.7   Vital Signs: Temp: 97.7 F (36.5 C) (09/13 0556) Temp src: Oral (09/13 0556) BP: 121/72 mmHg (09/13 0556) Pulse Rate: 63  (09/13 0556)  Labs:  Basename 07/11/12 0248 07/09/12 2056  HGB 14.0 13.2  HCT 39.7 37.6*  PLT 158 139*  APTT -- --  LABPROT -- --  INR -- --  HEPARINUNFRC -- --  CREATININE 0.39* 0.35*  CKTOTAL -- --  CKMB -- --  TROPONINI -- --    Estimated Creatinine Clearance: 97.3 ml/min (by C-G formula based on Cr of 0.39).  Medical History: Past Medical History  Diagnosis Date  . Pancreatitis   . Seizures   . Back pain   . ETOH abuse   . COPD (chronic obstructive pulmonary disease)   . SAH (subarachnoid hemorrhage)   . Macrocytosis 08/17/2011  . Bradycardia 08/17/2011   Medications:  Scheduled:    . sodium chloride   Intravenous Once  . sodium chloride   Intravenous STAT  . carbamazepine  400 mg Oral BID   And  . carbamazepine  200 mg Oral Q lunch  . enoxaparin (LOVENOX) injection  40 mg Subcutaneous Q24H  . folic acid  1 mg Oral Daily  . HYDROmorphone      .  HYDROmorphone (DILAUDID) injection  1 mg Intravenous Once  . influenza  inactive virus vaccine  0.5 mL Intramuscular Tomorrow-1000  . levETIRAcetam  500 mg Oral BID  . multivitamin with minerals  1 tablet Oral Daily  . ondansetron  4 mg Intravenous Once  . pantoprazole  40 mg Oral BID AC  . general admission iv infusion   Intravenous Once  . thiamine  100 mg Oral Daily   Or  . thiamine  100 mg Intravenous Daily  . DISCONTD: carbamazepine  200-400 mg Oral TID    Assessment: 36yo male with pancreatitis.  Good renal fxn.  Goal of Therapy:  VTE prophylaxis Monitor platelets by anticoagulation protocol: Yes   Plan:  Lovenox 40mg  sq q24hrs CBC  per protocol  Valrie Hart A 07/11/2012,12:17 PM

## 2012-07-11 NOTE — Plan of Care (Signed)
Problem: Phase I Progression Outcomes Goal: Pain controlled with appropriate interventions Outcome: Progressing Attempting to work towards better pain control.  MD notified of pt continual c/o pain med regimen adjusted.

## 2012-07-11 NOTE — Progress Notes (Signed)
NAMEJAYTHAN, [name] NO.:  [id number]  MEDICAL RECORD NO.:  [id number]  LOCATION:  A209                          FACILITY:  [facility]  PHYSICIAN:  Melvyn Novas, MDDATE OF BIRTH:  03-25-1976  DATE OF PROCEDURE: DATE OF DISCHARGE:                                PROGRESS NOTE   The patient admitted with alcohol pancreatitis, takes opioid analgesia for chronic lumbar radicular pain, and status post lumbosacral laminectomy smoking a pack per day, imbibing alcohol daily arrives to the ER with a lipase of  273.  He is admitted with epigastric pain, currently on a clear liquid diet.  Renal function is normal.  Creatinine is 4, potassium is 3.7.  Hemoglobin is 14, platelet count is also 158.  PHYSICAL EXAMINATION:  Blood pressure 121/72, temperature 97.7, pulse 63 and regular, respiratory rate is 20.  The patient on intravenous Dilaudid 1 mg q.3-4 h. p.r.n. for epigastric pain, tolerating clear liquids.  Lungs show prolonged expiratory phase.  Scattered rhonchi.  No rales, no wheeze.  Heart regular rhythm.  No murmurs, gallops, or rubs. Has significant epigastric discomfort to palpation.  Bowel sounds normoactive.  No borborygmi.  No guarding or rebound.  Plan at present is to monitor daily lipase, hepatic function, and obtain serum ammonia level.  At present, stools for occult blood, and BMET daily, and continue with liquid diet.  We will add Protonix 40 q.12 IV. If pain persists, we will consider CT to rule out pseudocyst formation. No signs of septicemia clinically at present.     Melvyn Novas, MD     RMD/MEDQ  D:  07/11/2012  T:  07/11/2012  Job:  [id number]

## 2012-07-11 NOTE — ED Notes (Signed)
Pt was seen last night and treated for pain from his pancreatitis. States he started drinking beers earlier this afternoon and the pain started back. Admits to drinking 2 16oz beers. Pain is to his upper left quadrant.

## 2012-07-11 NOTE — ED Provider Notes (Addendum)
History     CSN: [id number]  Arrival date & time 07/11/12  0234   First MD Initiated Contact with Patient 07/11/12 0255      Chief Complaint  Patient presents with  . Pancreatitis    (Consider location/radiation/quality/duration/timing/severity/associated sxs/prior treatment) HPI [name] is a 36 y.o. male  With a h/o pancreatitis, chronic back pain who still drinks daily  brought in by ambulance, who presents to the Emergency Department complaining of continued abdominal pain with worsening after trying to eat chicken tonight. He has been seen twice here recently for abdominal pain, lipase was 489. Abdominal pain is across the entire upper abdomen. Denies vomiting or diarrhea. Admits to drinking beer tonight.   PCP Dr. Janna Arch Neurosurgeon  Dr. Channing Mutters  Past Medical History  Diagnosis Date  . Pancreatitis   . Seizures   . Back pain   . ETOH abuse   . COPD (chronic obstructive pulmonary disease)   . SAH (subarachnoid hemorrhage)   . Macrocytosis 08/17/2011  . Bradycardia 08/17/2011    Past Surgical History  Procedure Date  . Back surgery   . Septoplasty     Family History  Problem Relation Age of Onset  . Seizures Father   . Alcohol abuse Father   . Coronary artery disease Mother     deceased age 48    History  Substance Use Topics  . Smoking status: Current Every Day Smoker -- 1.0 packs/day    Types: Cigarettes  . Smokeless tobacco: Not on file  . Alcohol Use: Yes     every day      Review of Systems  Constitutional: Negative for fever.       10 Systems reviewed and are negative for acute change except as noted in the HPI.  HENT: Negative for congestion.   Eyes: Negative for discharge and redness.  Respiratory: Negative for cough and shortness of breath.   Cardiovascular: Negative for chest pain.  Gastrointestinal: Positive for nausea and abdominal pain. Negative for vomiting.  Musculoskeletal: Negative for back pain.  Skin: Negative for rash.    Neurological: Negative for syncope, numbness and headaches.  Psychiatric/Behavioral:       No behavior change.    Allergies  Review of patient's allergies indicates no known allergies.  Home Medications   Current Outpatient Rx  Name Route Sig Dispense Refill  . HYDROCODONE-ACETAMINOPHEN 7.5-500 MG/15ML PO SOLN Oral Take 7.5 mLs by mouth every 6 (six) hours as needed for pain. 60 mL 0  . CARBAMAZEPINE 200 MG PO TABS Oral Take 200-400 mg by mouth 3 (three) times daily. Take 2 tablets in the morning, 1 tablet at lunch and 2 tablets at bedtime.    . IBUPROFEN 200 MG PO TABS Oral Take 800 mg by mouth once as needed. FOR PAIN    . LEVETIRACETAM 500 MG PO TABS Oral Take 500 mg by mouth 2 (two) times daily.     . OXYCODONE-ACETAMINOPHEN 5-500 MG PO CAPS Oral Take 2 capsules by mouth every 6 (six) hours as needed. For pain      BP 150/81  Pulse 84  Temp 97.8 F (36.6 C) (Oral)  Resp 20  Ht 5\' 9"  (1.753 m)  Wt 120 lb (54.432 kg)  BMI 17.72 kg/m2  SpO2 99%  Physical Exam  Nursing note and vitals reviewed. Constitutional: He is oriented to person, place, and time. He appears well-developed and well-nourished.       Awake, alert, nontoxic appearance.  HENT:  Head:  Atraumatic.  Eyes: Right eye exhibits no discharge. Left eye exhibits no discharge.  Neck: Neck supple.  Cardiovascular: Normal heart sounds.   Pulmonary/Chest: Effort normal and breath sounds normal. He exhibits no tenderness.  Abdominal: Soft. There is tenderness. There is no rebound.       Tenderness to epigastric area and RUQ, LUQ. Pushes hand away from exam. No rebound.  Genitourinary:       No cva tenderness  Musculoskeletal: He exhibits no tenderness.       Baseline ROM, no obvious new focal weakness.  Neurological: He is oriented to person, place, and time.       Mental status and motor strength appears baseline for patient and situation.  Skin: No rash noted.  Psychiatric: He has a normal mood and affect.     ED Course  Procedures (including critical care time) Results for orders placed during the hospital encounter of 07/11/12  LIPASE, BLOOD      Component Value Range   Lipase 273 (*) 11 - 59 U/L  CBC WITH DIFFERENTIAL      Component Value Range   WBC 6.5  4.0 - 10.5 K/uL   RBC 3.53 (*) 4.22 - 5.81 MIL/uL   Hemoglobin 14.0  13.0 - 17.0 g/dL   HCT 40.9  81.1 - 91.4 %   MCV 112.5 (*) 78.0 - 100.0 fL   MCH 39.7 (*) 26.0 - 34.0 pg   MCHC 35.3  30.0 - 36.0 g/dL   RDW 78.2  95.6 - 21.3 %   Platelets 158  150 - 400 K/uL   Neutrophils Relative 59  43 - 77 %   Neutro Abs 3.8  1.7 - 7.7 K/uL   Lymphocytes Relative 24  12 - 46 %   Lymphs Abs 1.6  0.7 - 4.0 K/uL   Monocytes Relative 15 (*) 3 - 12 %   Monocytes Absolute 0.9  0.1 - 1.0 K/uL   Eosinophils Relative 2  0 - 5 %   Eosinophils Absolute 0.1  0.0 - 0.7 K/uL   Basophils Relative 0  0 - 1 %   Basophils Absolute 0.0  0.0 - 0.1 K/uL  COMPREHENSIVE METABOLIC PANEL      Component Value Range   Sodium 130 (*) 135 - 145 mEq/L   Potassium 3.7  3.5 - 5.1 mEq/L   Chloride 95 (*) 96 - 112 mEq/L   CO2 26  19 - 32 mEq/L   Glucose, Bld 87  70 - 99 mg/dL   BUN 3 (*) 6 - 23 mg/dL   Creatinine, Ser 0.86 (*) 0.50 - 1.35 mg/dL   Calcium 8.9  8.4 - 57.8 mg/dL   Total Protein 6.7  6.0 - 8.3 g/dL   Albumin 3.6  3.5 - 5.2 g/dL   AST 29  0 - 37 U/L   ALT 13  0 - 53 U/L   Alkaline Phosphatase 121 (*) 39 - 117 U/L   Total Bilirubin 0.4  0.3 - 1.2 mg/dL   GFR calc non Af Amer >90  >90 mL/min   GFR calc Af Amer >90  >90 mL/min  ETHANOL      Component Value Range   Alcohol, Ethyl (B) <11  0 - 11 mg/dL  URINE RAPID DRUG SCREEN (HOSP PERFORMED)      Component Value Range   Opiates NONE DETECTED  NONE DETECTED   Cocaine NONE DETECTED  NONE DETECTED   Benzodiazepines NONE DETECTED  NONE DETECTED   Amphetamines NONE DETECTED  NONE DETECTED   Tetrahydrocannabinol POSITIVE (*) NONE DETECTED   Barbiturates NONE DETECTED  NONE DETECTED    0430 Pain  has improved but still present. Ordered additional analgesic.Labs with lipase decreased To 273 from 07/09/2012 489. 0450 Patient states he does not feel he can go home. Will speak with hospitalist. 4:56 AM:  T/C to Dr. Onalee Hua, hospitalist, case discussed, including:  HPI, pertinent PM/SHx, VS/PE, dx testing, ED course and treatment.  Agreeable to admission.  Requests to write temporary orders, med surg bed to team Dr. Janna Arch.  MDM  Patient with h/o pancreatitis who continues to drink daily here with continued abdominal pain that began several days ago. Lipase has improved from 07/09/2012. Given 2 doses analgesic with some relief. Nausea improved.Spoke with Dr. Onalee Hua, hospitalist who will admit the patient to Dr. Janna Arch.Pt stable in ED with no significant deterioration in condition.The patient appears reasonably stabilized for admission considering the current resources, flow, and capabilities available in the ED at this time, and I doubt any other [facility] requiring further screening and/or treatment in the ED prior to admission.  MDM Reviewed: previous chart, nursing note and vitals Reviewed previous: labs Interpretation: labs  Critical care: 30 minutes         Nicoletta Dress. Colon Branch, MD 07/11/12 1610  Nicoletta Dress. Colon Branch, MD 07/11/12 9604

## 2012-07-12 LAB — COMPREHENSIVE METABOLIC PANEL
ALT: 11 U/L (ref 0–53)
CO2: 27 mEq/L (ref 19–32)
Calcium: 8.5 mg/dL (ref 8.4–10.5)
Creatinine, Ser: 0.4 mg/dL — ABNORMAL LOW (ref 0.50–1.35)
GFR calc Af Amer: >90 mL/min (ref >90)
GFR calc non Af Amer: >90 mL/min (ref >90)
Glucose, Bld: 103 mg/dL — ABNORMAL HIGH (ref 70–99)

## 2012-07-12 LAB — CBC
Hemoglobin: 12.5 g/dL — ABNORMAL LOW (ref 13.0–17.0)
MCHC: 34.6 g/dL (ref 30.0–36.0)
RBC: 3.17 MIL/uL — ABNORMAL LOW (ref 4.22–5.81)

## 2012-07-12 LAB — PROTIME-INR
INR: 0.96 (ref 0.00–1.49)
Prothrombin Time: 13 seconds (ref 11.6–15.2)

## 2012-07-12 MED ORDER — HYDROMORPHONE HCL PF 1 MG/ML IJ SOLN
2.0000 mg | INTRAMUSCULAR | Status: DC | PRN
  Administered 2012-07-12 – 2012-07-14 (×15): 2 mg via INTRAVENOUS
  Filled 2012-07-12: qty 2
  Filled 2012-07-12: qty 8
  Filled 2012-07-12 (×2): qty 2
  Filled 2012-07-12: qty 6
  Filled 2012-07-12 (×3): qty 2
  Filled 2012-07-12: qty 1
  Filled 2012-07-12: qty 2
  Filled 2012-07-12: qty 1
  Filled 2012-07-12: qty 2
  Filled 2012-07-12: qty 6
  Filled 2012-07-12 (×5): qty 2

## 2012-07-12 MED ORDER — NICOTINE 21 MG/24HR TD PT24
21.0000 mg | MEDICATED_PATCH | Freq: Every day | TRANSDERMAL | Status: DC
  Administered 2012-07-12 – 2012-07-14 (×3): 21 mg via TRANSDERMAL
  Filled 2012-07-12 (×3): qty 1

## 2012-07-12 NOTE — Plan of Care (Signed)
Problem: Phase I Progression Outcomes Goal: Pain controlled with appropriate interventions Medication regimen changed.

## 2012-07-12 NOTE — Progress Notes (Signed)
[name], [name]:  [id number]  MEDICAL RECORD NO.:  [id number]  LOCATION:  A209                          FACILITY:  [facility]  PHYSICIAN:  Melvyn Novas, MDDATE OF BIRTH:  1976-06-26  DATE OF PROCEDURE: DATE OF DISCHARGE:                                PROGRESS NOTE   The patient is admitted with ethanol hepatitis and pancreatitis, has history of seizure disorder, chronic lumbosacral disk pain, COPD from smoking.  Blood pressure is 142/85, temperature 97.7, pulse 63 and regular, respiratory rate is 20.  The patient currently on Ativan 1 mg IV q.6, thiamine 100 mg IV daily, multivitamins.  No evidence of seizure activity.  Lipase down from 276 to 126 today, has persistent epigastric discomfort.  Lungs are clear with a prolonged expiratory phase.  No rales, wheezes, or rhonchi appreciable.  Heart regular rhythm.  No murmurs, gallops, or rubs.  The patient tolerating clear liquids.  We will continue clear liquids for another 24 hours.  Monitor liver function and lipase and BMET daily and continue IV Dilaudid and weaning process.     Melvyn Novas, MD     RMD/MEDQ  D:  07/12/2012  T:  07/12/2012  Job:  [id number]

## 2012-07-13 LAB — BASIC METABOLIC PANEL
Calcium: 8.8 mg/dL (ref 8.4–10.5)
Creatinine, Ser: 0.41 mg/dL — ABNORMAL LOW (ref 0.50–1.35)
GFR calc non Af Amer: >90 mL/min (ref >90)
Glucose, Bld: 143 mg/dL — ABNORMAL HIGH (ref 70–99)
Sodium: 131 mEq/L — ABNORMAL LOW (ref 135–145)

## 2012-07-13 LAB — HEPATIC FUNCTION PANEL
Alkaline Phosphatase: 116 U/L (ref 39–117)
Bilirubin, Direct: 0.2 mg/dL (ref 0.0–0.3)
Indirect Bilirubin: 0.2 mg/dL — ABNORMAL LOW (ref 0.3–0.9)
Total Bilirubin: 0.4 mg/dL (ref 0.3–1.2)
Total Protein: 6 g/dL (ref 6.0–8.3)

## 2012-07-13 MED ORDER — POTASSIUM CHLORIDE 10 MEQ/100ML IV SOLN
10.0000 meq | INTRAVENOUS | Status: AC
  Administered 2012-07-13 (×3): 10 meq via INTRAVENOUS

## 2012-07-13 MED ORDER — SODIUM CHLORIDE 0.9 % IJ SOLN
INTRAMUSCULAR | Status: AC
  Filled 2012-07-13: qty 3

## 2012-07-13 MED ORDER — POTASSIUM CHLORIDE 10 MEQ/100ML IV SOLN
INTRAVENOUS | Status: AC
  Administered 2012-07-13: 10 meq via INTRAVENOUS
  Filled 2012-07-13: qty 300

## 2012-07-13 NOTE — Plan of Care (Signed)
Problem: Phase II Progression Outcomes Goal: Tolerates PO clear liquids Outcome: Completed/Met Date Met:  07/13/12 Dr. Janna Arch advance diet to full liquids today at lunch.  Pt tolerated well.

## 2012-07-13 NOTE — Progress Notes (Signed)
Patient showed the aide sum meds he had of his own. When i came in the room he had two pills in hand and stated it was his tegretol and seizure medicince and i asked where he had them he showed me a vial on his [name] where he says he keeps them because his doctor told him to always carry his seizure medicine. I told him that we are already giving him his seizure meds and for him to keep his in his keychain due to possible overdose. He kept his pills and put them back in his keys and stuck his keys in his pocket and assured me he would not take them but needed to keep his own on him at all times. He claims his meds are his "Lifeline".

## 2012-07-14 LAB — HEPATIC FUNCTION PANEL
Indirect Bilirubin: 0.2 mg/dL — ABNORMAL LOW (ref 0.3–0.9)
Total Protein: 6.2 g/dL (ref 6.0–8.3)

## 2012-07-14 LAB — BASIC METABOLIC PANEL
CO2: 29 mEq/L (ref 19–32)
Calcium: 8.7 mg/dL (ref 8.4–10.5)
GFR calc Af Amer: >90 mL/min (ref >90)
GFR calc non Af Amer: >90 mL/min (ref >90)
Sodium: 131 mEq/L — ABNORMAL LOW (ref 135–145)

## 2012-07-14 LAB — LIPASE, BLOOD: Lipase: 29 U/L (ref 11–59)

## 2012-07-14 MED ORDER — HYDROMORPHONE HCL PF 1 MG/ML IJ SOLN
1.0000 mg | INTRAMUSCULAR | Status: DC | PRN
  Administered 2012-07-12 – 2012-07-15 (×10): 1 mg via INTRAVENOUS
  Filled 2012-07-14 (×6): qty 1
  Filled 2012-07-14: qty 14
  Filled 2012-07-14 (×4): qty 1

## 2012-07-14 MED ORDER — SODIUM CHLORIDE 0.9 % IJ SOLN
INTRAMUSCULAR | Status: AC
  Administered 2012-07-14: 3 mL
  Filled 2012-07-14: qty 3

## 2012-07-14 MED ORDER — SODIUM CHLORIDE 0.9 % IJ SOLN
INTRAMUSCULAR | Status: AC
  Administered 2012-07-14: 10 mL
  Filled 2012-07-14: qty 3

## 2012-07-14 MED ORDER — SODIUM CHLORIDE 0.9 % IJ SOLN
INTRAMUSCULAR | Status: AC
  Administered 2012-07-14: 10:00:00
  Filled 2012-07-14: qty 3

## 2012-07-14 NOTE — Progress Notes (Signed)
NAMETAM, DELISLE NO.:  [id number]  MEDICAL RECORD NO.:  [id number]  LOCATION:  A209                          FACILITY:  [facility]  PHYSICIAN:  Melvyn Novas, MDDATE OF BIRTH:  03-21-1976  DATE OF PROCEDURE: DATE OF DISCHARGE:                                PROGRESS NOTE   The patient has acute pancreatitis, ethanol withdrawal has less tremulous today, on Ativan protocol.  Lipase is normal as he is down to 1 mg of IV Dilaudid, tolerating full liquids this morning.  We will advance to full diet see how he tolerates this and hopefully discharge in a.m.  Potassium 3.9, creatinine 0.39.  PHYSICAL EXAMINATION:  VITAL SIGNS:  Blood pressure is 98/65, temperature 98.2 pulse is 92 and regular, respiratory rate is 20. LUNGS:  Prolonged expiratory phase.  Scattered rhonchi.  No rales, no wheeze. HEART:  Regular rhythm.  No murmurs, gallops, or rubs. ABDOMEN:  Some definite epigastric tenderness to moderate palpation. Bowel sounds normoactive.  No guarding, rebound, mass, or megaly.  Continue current therapy.  Discharge in a.m.     Melvyn Novas, MD     RMD/MEDQ  D:  07/14/2012  T:  07/14/2012  Job:  [id number]

## 2012-07-14 NOTE — Progress Notes (Signed)
[id number] 

## 2012-07-14 NOTE — Progress Notes (Signed)
UR Chart Review Completed  

## 2012-07-14 NOTE — Plan of Care (Signed)
Problem: Phase III Progression Outcomes Goal: Activity at appropriate level-compared to baseline (UP IN CHAIR FOR HEMODIALYSIS)  Outcome: Completed/Met Date Met:  07/14/12 Up in room Goal: Amylase, WBC trending downward Outcome: Completed/Met Date Met:  07/14/12 WBC 6.3  07/12/2012

## 2012-07-15 LAB — CBC
HCT: 41.1 % (ref 39.0–52.0)
Hemoglobin: 14.1 g/dL (ref 13.0–17.0)
MCH: 38.4 pg — ABNORMAL HIGH (ref 26.0–34.0)
MCHC: 34.3 g/dL (ref 30.0–36.0)
MCV: 112 fL — ABNORMAL HIGH (ref 78.0–100.0)
Platelets: 168 10*3/uL (ref 150–400)
RBC: 3.67 MIL/uL — ABNORMAL LOW (ref 4.22–5.81)
RDW: 12.5 % (ref 11.5–15.5)
WBC: 7.1 10*3/uL (ref 4.0–10.5)

## 2012-07-15 MED ORDER — PANTOPRAZOLE SODIUM 40 MG PO TBEC: 40.0000 mg | DELAYED_RELEASE_TABLET | Freq: Two times a day (BID) | ORAL | Status: DC

## 2012-07-15 NOTE — Discharge Summary (Signed)
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  The patient presented with alcoholic pancreatitis, amylase 400, clinical epigastric discomfort with no signs of bleeding __________ controlled with a clear liquid diet, bowel rest, IV Protonix q.12 h. 40 mg, intravenous Dilaudid and Ativan protocol for alcohol withdrawal seizures.  I get no evidence of seizure activity while in the hospital. The patient did well.  He had some recurrent hypokalemia in hospital/replenished intravenously, was given vitamin and multivitamins. The patient's stayed in the hospital for a total of 5 days, his lipase trending down to normal a day prior to his discharge.  Advanced to a full diet and tolerated this well.  He was subsequently discharged on the following medicines:  Tegretol 400 mg p.o. b.i.d. and 200 mg p.o. before lunch, folic acid, Protonix 40 mg p.o. daily, Keppra 500 mg p.o. b.i.d., Avelox 5/500 one p.o. t.i.d. p.r.n. at home, folic acid 1 mg. The patient was told to quit alcohol and quit smoking.  To follow up in the office in 4 days' time for monitoring electrolytes, hemoglobin, as well as lipase for clinical symptomatology.     Melvyn Novas, MD     RMD/MEDQ  D:  07/15/2012  T:  07/15/2012  Job:  [id number]

## 2012-07-15 NOTE — Plan of Care (Signed)
Problem: Discharge Progression Outcomes Goal: Pain controlled with appropriate interventions Outcome: Completed/Met Date Met:  07/15/12 Pt received dilaudid IV for pain while a pt Goal: Tolerating diet Outcome: Completed/Met Date Met:  07/15/12 Low sodium diet without nausea or increased pain Goal: Activity appropriate for discharge plan Outcome: Completed/Met Date Met:  07/15/12 ambulatory Goal: Other Discharge Outcomes/Goals Outcome: Completed/Met Date Met:  07/15/12 Pt pain in control denies nausea Discharged to home with friend

## 2012-07-15 NOTE — Discharge Summary (Signed)
[id number] 

## 2012-08-13 ENCOUNTER — Encounter (HOSPITAL_COMMUNITY): Payer: Self-pay | Admitting: *Deleted

## 2012-08-13 ENCOUNTER — Emergency Department (HOSPITAL_COMMUNITY)
Admission: EM | Admit: 2012-08-13 | Discharge: 2012-08-13 | Disposition: A | Discharge status: Home / Self Care | Payer: Medicaid Other | Attending: Emergency Medicine | Admitting: Emergency Medicine

## 2012-08-13 DIAGNOSIS — J4489 Other specified chronic obstructive pulmonary disease: Secondary | ICD-10-CM | POA: Insufficient documentation

## 2012-08-13 DIAGNOSIS — G40909 Epilepsy, unspecified, not intractable, without status epilepticus: Secondary | ICD-10-CM | POA: Insufficient documentation

## 2012-08-13 DIAGNOSIS — J449 Chronic obstructive pulmonary disease, unspecified: Secondary | ICD-10-CM | POA: Insufficient documentation

## 2012-08-13 DIAGNOSIS — L0201 Cutaneous abscess of face: Secondary | ICD-10-CM | POA: Insufficient documentation

## 2012-08-13 DIAGNOSIS — F17200 Nicotine dependence, unspecified, uncomplicated: Secondary | ICD-10-CM | POA: Insufficient documentation

## 2012-08-13 DIAGNOSIS — L0291 Cutaneous abscess, unspecified: Secondary | ICD-10-CM

## 2012-08-13 DIAGNOSIS — L03211 Cellulitis of face: Secondary | ICD-10-CM | POA: Insufficient documentation

## 2012-08-13 MED ORDER — DOXYCYCLINE HYCLATE 100 MG PO TABS
100.0000 mg | ORAL_TABLET | Freq: Once | ORAL | Status: AC
  Administered 2012-08-13: 100 mg via ORAL
  Filled 2012-08-13: qty 1

## 2012-08-13 MED ORDER — OXYCODONE-ACETAMINOPHEN 5-325 MG PO TABS
1.0000 | ORAL_TABLET | Freq: Once | ORAL | Status: AC
  Administered 2012-08-13: 1 via ORAL
  Filled 2012-08-13: qty 1

## 2012-08-13 MED ORDER — DOXYCYCLINE HYCLATE 100 MG PO CAPS: 100.0000 mg | ORAL_CAPSULE | Freq: Two times a day (BID) | ORAL | Status: DC

## 2012-08-13 MED ORDER — LIDOCAINE HCL (PF) 1 % IJ SOLN
5.0000 mL | Freq: Once | INTRAMUSCULAR | Status: AC
  Administered 2012-08-13: 5 mL via INTRADERMAL
  Filled 2012-08-13: qty 5

## 2012-08-13 MED ORDER — OXYCODONE-ACETAMINOPHEN 5-325 MG PO TABS: 1.0000 | ORAL_TABLET | ORAL | Status: AC | PRN

## 2012-08-13 NOTE — ED Notes (Signed)
Pt reports pain is worse due to I and D.  PA notified.

## 2012-08-13 NOTE — ED Provider Notes (Signed)
History     CSN: [id number]  Arrival date & time 08/13/12  1107   First MD Initiated Contact with Patient 08/13/12 1130      Chief Complaint  Patient presents with  . Abscess    (Consider location/radiation/quality/duration/timing/severity/associated sxs/prior treatment) HPI Comments: Patient complains of a boil to his left face for 2 days. States the symptoms began after shaving.  States it began as a small pimple and became larger and more painful. States he has been trying to squeeze it. He denies difficulty swallowing, dental pain, neck pain, fever or chills.  Patient is a 36 y.o. male presenting with abscess. The history is provided by the patient.  Abscess  This is a new problem. The current episode started less than one week ago. The onset was gradual. The problem occurs continuously. The problem has been gradually worsening. The abscess is present on the face. The problem is moderate. The abscess is characterized by redness, painfulness, swelling and itchiness. The abscess first occurred at home. Pertinent negatives include no fever, no diarrhea, no vomiting, no rhinorrhea, no sore throat and no cough. His past medical history does not include skin abscesses in family. There were no sick contacts. He has received no recent medical care.    Past Medical History  Diagnosis Date  . Pancreatitis   . Seizures   . Back pain   . ETOH abuse   . COPD (chronic obstructive pulmonary disease)   . SAH (subarachnoid hemorrhage)   . Macrocytosis 08/17/2011  . Bradycardia 08/17/2011    Past Surgical History  Procedure Date  . Back surgery   . Septoplasty     Family History  Problem Relation Age of Onset  . Seizures Father   . Alcohol abuse Father   . Coronary artery disease Mother     deceased age 34    History  Substance Use Topics  . Smoking status: Current Every Day Smoker -- 1.0 packs/day    Types: Cigarettes  . Smokeless tobacco: Not on file  . Alcohol Use: Yes   every day      Review of Systems  Constitutional: Negative for fever, chills, activity change and appetite change.  HENT: Negative for sore throat and rhinorrhea.   Respiratory: Negative for cough.   Gastrointestinal: Negative for nausea, vomiting and diarrhea.  Musculoskeletal: Negative for joint swelling and arthralgias.  Skin: Positive for color change.       Abscess   Hematological: Negative for adenopathy.  All other systems reviewed and are negative.    Allergies  Review of patient's allergies indicates no known allergies.  Home Medications   Current Outpatient Rx  Name Route Sig Dispense Refill  . CARBAMAZEPINE 200 MG PO TABS Oral Take 200-400 mg by mouth 3 (three) times daily. Take 2 tablets in the morning, 1 tablet at lunch and 2 tablets at bedtime.    Marland Kitchen LEVETIRACETAM 500 MG PO TABS Oral Take 500 mg by mouth 2 (two) times daily.     . OXYCODONE-ACETAMINOPHEN 5-500 MG PO CAPS Oral Take 2 capsules by mouth every 6 (six) hours as needed. For pain    . PANTOPRAZOLE SODIUM 40 MG PO TBEC Oral Take 1 tablet (40 mg total) by mouth 2 (two) times daily before a meal. 30 tablet 1    BP 146/96  Pulse 94  Temp 98 F (36.7 C) (Oral)  Resp 16  Ht 5\' 9"  (1.753 m)  Wt 120 lb (54.432 kg)  BMI 17.72 kg/m2  SpO2  100%  Physical Exam  Nursing note and vitals reviewed. Constitutional: He is oriented to person, place, and time. He appears well-developed and well-nourished. No distress.  HENT:  Head: Normocephalic and atraumatic.    Mouth/Throat: Uvula is midline, oropharynx is clear and moist and mucous membranes are normal.  Eyes: EOM are normal. Pupils are equal, round, and reactive to light.  Neck: Normal range of motion. Neck supple.  Cardiovascular: Normal rate, regular rhythm and normal heart sounds.   Pulmonary/Chest: Effort normal and breath sounds normal.  Musculoskeletal: Normal range of motion.  Lymphadenopathy:    He has no cervical adenopathy.  Neurological: He  is alert and oriented to person, place, and time. He exhibits normal muscle tone. Coordination normal.  Skin: Skin is warm. There is erythema.    ED Course  Procedures (including critical care time)  Labs Reviewed - No data to display No results found.      MDM    INCISION AND DRAINAGE Performed by: Maxwell Caul. Consent: Verbal consent obtained. Risks and benefits: risks, benefits and alternatives were discussed Type: abscess  Body area: left face Anesthesia: local infiltration  Local anesthetic: lidocaine 1 % w/o epinephrine  Anesthetic total:1 ml  Complexity: complex Blunt dissection to break up loculations  Drainage: purulent  Drainage amount: small  Packing material: 1/4 in iodoform gauze  Patient tolerance: Patient tolerated the procedure well with no immediate complications.     Pt agrees to return here if the sx's worsen.  Pt to remove the packing in 2 days Abscess likely related to folliculitis.  No trismus, patient is edentulous.  Prescribed: Doxycycline Percocet #10        Zoey Bidwell L. Parker, [place] 08/14/12 1829

## 2012-08-13 NOTE — ED Notes (Signed)
Abscess to left jaw x 2 days after shaving.

## 2012-08-15 NOTE — ED Provider Notes (Signed)
Medical screening examination/treatment/procedure(s) were performed by non-physician practitioner and as supervising physician I was immediately available for consultation/collaboration.   Joya Gaskins, MD 08/15/12 [phone]

## 2012-09-16 ENCOUNTER — Emergency Department (HOSPITAL_COMMUNITY): Payer: Medicaid Other

## 2012-09-16 ENCOUNTER — Emergency Department (HOSPITAL_COMMUNITY)
Admission: EM | Admit: 2012-09-16 | Discharge: 2012-09-16 | Disposition: A | Discharge status: Home / Self Care | Payer: Medicaid Other | Attending: Emergency Medicine | Admitting: Emergency Medicine

## 2012-09-16 ENCOUNTER — Encounter (HOSPITAL_COMMUNITY): Payer: Self-pay | Admitting: Emergency Medicine

## 2012-09-16 DIAGNOSIS — W010XXA Fall on same level from slipping, tripping and stumbling without subsequent striking against object, initial encounter: Secondary | ICD-10-CM | POA: Insufficient documentation

## 2012-09-16 DIAGNOSIS — M549 Dorsalgia, unspecified: Secondary | ICD-10-CM | POA: Insufficient documentation

## 2012-09-16 DIAGNOSIS — Z87828 Personal history of other (healed) physical injury and trauma: Secondary | ICD-10-CM | POA: Insufficient documentation

## 2012-09-16 DIAGNOSIS — J449 Chronic obstructive pulmonary disease, unspecified: Secondary | ICD-10-CM | POA: Insufficient documentation

## 2012-09-16 DIAGNOSIS — Z79899 Other long term (current) drug therapy: Secondary | ICD-10-CM | POA: Insufficient documentation

## 2012-09-16 DIAGNOSIS — R569 Unspecified convulsions: Secondary | ICD-10-CM | POA: Insufficient documentation

## 2012-09-16 DIAGNOSIS — K859 Acute pancreatitis without necrosis or infection, unspecified: Secondary | ICD-10-CM | POA: Insufficient documentation

## 2012-09-16 DIAGNOSIS — Z8679 Personal history of other diseases of the circulatory system: Secondary | ICD-10-CM | POA: Insufficient documentation

## 2012-09-16 DIAGNOSIS — Z862 Personal history of diseases of the blood and blood-forming organs and certain disorders involving the immune mechanism: Secondary | ICD-10-CM | POA: Insufficient documentation

## 2012-09-16 DIAGNOSIS — Y9389 Activity, other specified: Secondary | ICD-10-CM | POA: Insufficient documentation

## 2012-09-16 DIAGNOSIS — F17200 Nicotine dependence, unspecified, uncomplicated: Secondary | ICD-10-CM | POA: Insufficient documentation

## 2012-09-16 DIAGNOSIS — S20219A Contusion of unspecified front wall of thorax, initial encounter: Secondary | ICD-10-CM

## 2012-09-16 DIAGNOSIS — F1010 Alcohol abuse, uncomplicated: Secondary | ICD-10-CM | POA: Insufficient documentation

## 2012-09-16 DIAGNOSIS — J4489 Other specified chronic obstructive pulmonary disease: Secondary | ICD-10-CM | POA: Insufficient documentation

## 2012-09-16 DIAGNOSIS — Y929 Unspecified place or not applicable: Secondary | ICD-10-CM | POA: Insufficient documentation

## 2012-09-16 DIAGNOSIS — R0789 Other chest pain: Secondary | ICD-10-CM | POA: Insufficient documentation

## 2012-09-16 MED ORDER — IBUPROFEN 800 MG PO TABS
800.0000 mg | ORAL_TABLET | Freq: Once | ORAL | Status: AC
  Administered 2012-09-16: 800 mg via ORAL
  Filled 2012-09-16: qty 1

## 2012-09-16 MED ORDER — HYDROMORPHONE HCL PF 1 MG/ML IJ SOLN
1.0000 mg | Freq: Once | INTRAMUSCULAR | Status: AC
  Administered 2012-09-16: 1 mg via INTRAMUSCULAR
  Filled 2012-09-16: qty 1

## 2012-09-16 MED ORDER — PREDNISONE 20 MG PO TABS: 60.0000 mg | ORAL_TABLET | Freq: Every day | ORAL | Status: DC

## 2012-09-16 MED ORDER — PREDNISONE 50 MG PO TABS
60.0000 mg | ORAL_TABLET | Freq: Once | ORAL | Status: AC
  Administered 2012-09-16: 60 mg via ORAL
  Filled 2012-09-16: qty 1

## 2012-09-16 NOTE — ED Provider Notes (Signed)
History  This chart was scribed for Donnetta Hutching, MD by Manuela Schwartz, ED scribe. This patient was seen in room [facility] and the patient's care was started at [phone].   CSN: [id number]  Arrival date & time 09/16/12  1191   First MD Initiated Contact with Patient 09/16/12 [phone]      Chief Complaint  Patient presents with  . Rib Pain    Patient is a 36 y.o. male presenting with fall. The history is provided by the patient. No language interpreter was used.  Fall The accident occurred more than 1 week ago. The fall occurred while standing. He landed on a hard floor. There was no blood loss. The point of impact was the right hip (right ribs). Pain location: right ribs. The pain is moderate. He was ambulatory at the scene. Pertinent negatives include no fever, no nausea, no vomiting and no loss of consciousness. Exacerbated by: tactile pressure over right ribs. He has tried nothing for the symptoms.   [name] is a 36 y.o. male who presents to the Emergency Department complaining of constant non-radiating right rib pain from a fall 9 days ago while he was reportedly moving trees and tripped/fell. He states he tripped while lifting a tree and fell on the right side of his body landing on his right hip and right ribs. He denies any LOC from fall. He states constant soreness that has not improved with time and difficulty sleeping. He states takes 6 Tylox per day for chronic back pain which has not relieved his current symptoms. He denies any other injuries.   Past Medical History  Diagnosis Date  . Pancreatitis   . Seizures   . Back pain   . ETOH abuse   . COPD (chronic obstructive pulmonary disease)   . SAH (subarachnoid hemorrhage)   . Macrocytosis 08/17/2011  . Bradycardia 08/17/2011    Past Surgical History  Procedure Date  . Back surgery   . Septoplasty     Family History  Problem Relation Age of Onset  . Seizures Father   . Alcohol abuse Father   . Coronary artery disease  Mother     deceased age 37    History  Substance Use Topics  . Smoking status: Current Every Day Smoker -- 1.0 packs/day for 20 years    Types: Cigarettes  . Smokeless tobacco: Never Used  . Alcohol Use: Yes     Comment: occasionally      Review of Systems  Constitutional: Negative for fever and chills.  Respiratory: Negative for shortness of breath.   Cardiovascular: Positive for chest pain (right rib pain).  Gastrointestinal: Negative for nausea and vomiting.  Neurological: Negative for loss of consciousness and weakness.  All other systems reviewed and are negative.    Allergies  Review of patient's allergies indicates no known allergies.  Home Medications   Current Outpatient Rx  Name  Route  Sig  Dispense  Refill  . CARBAMAZEPINE 200 MG PO TABS   Oral   Take 200-400 mg by mouth 3 (three) times daily. Take 2 tablets in the morning, 1 tablet at lunch and 2 tablets at bedtime.         Marland Kitchen DOXYCYCLINE HYCLATE 100 MG PO CAPS   Oral   Take 1 capsule (100 mg total) by mouth 2 (two) times daily. For 10 days   20 capsule   0   . LEVETIRACETAM 500 MG PO TABS   Oral   Take 500 mg  by mouth 2 (two) times daily.          . OXYCODONE-ACETAMINOPHEN 5-500 MG PO CAPS   Oral   Take 2 capsules by mouth every 6 (six) hours as needed. For pain         . PANTOPRAZOLE SODIUM 40 MG PO TBEC   Oral   Take 1 tablet (40 mg total) by mouth 2 (two) times daily before a meal.   30 tablet   1     Triage Vitals: BP 167/94  Pulse 71  Temp 97.8 F (36.6 C)  Resp 24  Ht 5\' 9"  (1.753 m)  Wt 125 lb (56.7 kg)  BMI 18.46 kg/m2  SpO2 96%  Physical Exam  Nursing note and vitals reviewed. Constitutional: He is oriented to person, place, and time. He appears well-developed and well-nourished.  HENT:  Head: Normocephalic and atraumatic.  Eyes: Conjunctivae normal and EOM are normal. Pupils are equal, round, and reactive to light.  Neck: Normal range of motion. Neck supple.    Cardiovascular: Normal rate, regular rhythm and normal heart sounds.   Pulmonary/Chest: Effort normal and breath sounds normal. No respiratory distress. He has no wheezes. He has no rales. He exhibits tenderness (tender right anterior chest).  Abdominal: Soft. Bowel sounds are normal.  Musculoskeletal: Normal range of motion. He exhibits tenderness (right anterior chest tenderness).  Neurological: He is alert and oriented to person, place, and time.  Skin: Skin is warm and dry.  Psychiatric: He has a normal mood and affect.    ED Course  Procedures (including critical care time) DIAGNOSTIC STUDIES: Oxygen Saturation is 96% on room air, normal by my interpretation.    COORDINATION OF CARE: At 810 AM Discussed treatment plan with patient which includes CXR. Patient agrees.  Labs Reviewed - No data to display Dg Chest 2 View  09/16/2012  *RADIOLOGY REPORT*  Clinical Data: Larey Seat 10 days ago with right-sided chest pain  CHEST - 2 VIEW  Comparison: Chest x-ray of 06/03/2012  Findings: The lungs are clear but hyperaerated consistent with COPD.  No pneumothorax is seen.  No effusion is noted.  Mediastinal contours are stable.  The heart is within normal limits in size. There is some peribronchial thickening which may indicate bronchitis.  A partially compressed T9 vertebral body appears stable.  IMPRESSION:  1.  COPD.  No active lung disease. 2.  No pneumothorax.  No acute bony abnormality.   Original Report Authenticated By: Dwyane Dee, M.D.      No diagnosis found.    MDM  Chest x-ray shows no rib fracture. No respiratory distress. Oxygenation good. Discharge home on prednisone 60 mg for 5 days, ibuprofen 800 mg 3 times a day, patient is on prescription for pain medicine    I personally performed the services described in this documentation, which was scribed in my presence. The recorded information has been reviewed and is accurate.          Donnetta Hutching, MD 09/16/12 [phone]

## 2012-09-16 NOTE — ED Notes (Addendum)
Patient c/o right rib pain after falling 10 days ago moving trees. Patient denies shortness of breath. Reports hitting hip and head with LOC. Patient states "I thought the pain would just go away." Patient denies hip pain, headache, or dizziness.

## 2012-09-23 ENCOUNTER — Emergency Department (HOSPITAL_COMMUNITY)
Admission: EM | Admit: 2012-09-23 | Discharge: 2012-09-23 | Disposition: A | Discharge status: Home / Self Care | Payer: Medicaid Other | Attending: Emergency Medicine | Admitting: Emergency Medicine

## 2012-09-23 ENCOUNTER — Encounter (HOSPITAL_COMMUNITY): Payer: Self-pay | Admitting: Emergency Medicine

## 2012-09-23 DIAGNOSIS — S299XXA Unspecified injury of thorax, initial encounter: Secondary | ICD-10-CM

## 2012-09-23 DIAGNOSIS — Y929 Unspecified place or not applicable: Secondary | ICD-10-CM | POA: Insufficient documentation

## 2012-09-23 DIAGNOSIS — S298XXA Other specified injuries of thorax, initial encounter: Secondary | ICD-10-CM | POA: Insufficient documentation

## 2012-09-23 DIAGNOSIS — J4489 Other specified chronic obstructive pulmonary disease: Secondary | ICD-10-CM | POA: Insufficient documentation

## 2012-09-23 DIAGNOSIS — Z8719 Personal history of other diseases of the digestive system: Secondary | ICD-10-CM | POA: Insufficient documentation

## 2012-09-23 DIAGNOSIS — F17200 Nicotine dependence, unspecified, uncomplicated: Secondary | ICD-10-CM | POA: Insufficient documentation

## 2012-09-23 DIAGNOSIS — W1809XA Striking against other object with subsequent fall, initial encounter: Secondary | ICD-10-CM | POA: Insufficient documentation

## 2012-09-23 DIAGNOSIS — Y939 Activity, unspecified: Secondary | ICD-10-CM | POA: Insufficient documentation

## 2012-09-23 DIAGNOSIS — Z79899 Other long term (current) drug therapy: Secondary | ICD-10-CM | POA: Insufficient documentation

## 2012-09-23 DIAGNOSIS — D7589 Other specified diseases of blood and blood-forming organs: Secondary | ICD-10-CM | POA: Insufficient documentation

## 2012-09-23 DIAGNOSIS — J449 Chronic obstructive pulmonary disease, unspecified: Secondary | ICD-10-CM | POA: Insufficient documentation

## 2012-09-23 DIAGNOSIS — F1010 Alcohol abuse, uncomplicated: Secondary | ICD-10-CM | POA: Insufficient documentation

## 2012-09-23 DIAGNOSIS — I498 Other specified cardiac arrhythmias: Secondary | ICD-10-CM | POA: Insufficient documentation

## 2012-09-23 MED ORDER — IBUPROFEN 800 MG PO TABS
800.0000 mg | ORAL_TABLET | Freq: Once | ORAL | Status: AC
  Administered 2012-09-23: 800 mg via ORAL
  Filled 2012-09-23: qty 1

## 2012-09-23 MED ORDER — ALBUTEROL SULFATE HFA 108 (90 BASE) MCG/ACT IN AERS
2.0000 | INHALATION_SPRAY | RESPIRATORY_TRACT | Status: DC | PRN
  Administered 2012-09-23: 2 via RESPIRATORY_TRACT
  Filled 2012-09-23: qty 6.7

## 2012-09-23 MED ORDER — IBUPROFEN 800 MG PO TABS: 800.0000 mg | ORAL_TABLET | Freq: Three times a day (TID) | ORAL | Status: DC

## 2012-09-23 MED ORDER — HYDROMORPHONE HCL PF 1 MG/ML IJ SOLN
1.0000 mg | Freq: Once | INTRAMUSCULAR | Status: AC
  Administered 2012-09-23: 1 mg via INTRAMUSCULAR
  Filled 2012-09-23: qty 1

## 2012-09-23 NOTE — ED Provider Notes (Signed)
History     CSN: [id number]  Arrival date & time 09/23/12  0508   First MD Initiated Contact with Patient 09/23/12 [phone]      Chief Complaint  Patient presents with  . Chest Pain    right rib pain    (Consider location/radiation/quality/duration/timing/severity/associated sxs/prior treatment) HPI History provided by patient. Right rib pain since injury over a week ago. States she fell and tripped landing on a log injuring his ribs. He was evaluated here last week with x-rays. He was given pain medicine and has ran out. He is having trouble sleeping due to rib pain which is sharp in quality and not radiating from right lateral ribs. He has baseline shortness of breath with COPD and continues to smoke cigarettes. No new shortness of breath. Pain is moderate in severity it hurts to take a deep breath. Hurts to touch the area. Pain medications were helping. No other known alleviating factors. Patient has not seen his primary care physician in this interim, Dr. Delbert Harness.  No fevers or chills. No new cough. Past Medical History  Diagnosis Date  . Pancreatitis   . Seizures   . Back pain   . ETOH abuse   . COPD (chronic obstructive pulmonary disease)   . SAH (subarachnoid hemorrhage)   . Macrocytosis 08/17/2011  . Bradycardia 08/17/2011    Past Surgical History  Procedure Date  . Back surgery   . Septoplasty     Family History  Problem Relation Age of Onset  . Seizures Father   . Alcohol abuse Father   . Coronary artery disease Mother     deceased age 37    History  Substance Use Topics  . Smoking status: Current Every Day Smoker -- 1.0 packs/day for 20 years    Types: Cigarettes  . Smokeless tobacco: Never Used  . Alcohol Use: Yes     Comment: occasionally      Review of Systems  Constitutional: Negative for fever and chills.  HENT: Negative for neck pain and neck stiffness.   Eyes: Negative for pain.  Respiratory: Negative for shortness of breath.     Cardiovascular: Negative for palpitations and leg swelling.  Gastrointestinal: Negative for abdominal pain.  Genitourinary: Negative for dysuria.  Musculoskeletal: Negative for back pain.  Skin: Negative for rash.  Neurological: Negative for headaches.  All other systems reviewed and are negative.    Allergies  Review of patient's allergies indicates no known allergies.  Home Medications   Current Outpatient Rx  Name  Route  Sig  Dispense  Refill  . CARBAMAZEPINE 200 MG PO TABS   Oral   Take 200-400 mg by mouth 3 (three) times daily. Take 2 tablets in the morning, 1 tablet at lunch and 2 tablets at bedtime.         . IBUPROFEN 800 MG PO TABS   Oral   Take 1 tablet (800 mg total) by mouth 3 (three) times daily.   21 tablet   0   . LEVETIRACETAM 500 MG PO TABS   Oral   Take 500 mg by mouth 2 (two) times daily.          . OXYCODONE-ACETAMINOPHEN 5-500 MG PO CAPS   Oral   Take 2 capsules by mouth every 6 (six) hours as needed. For pain         . PREDNISONE 20 MG PO TABS   Oral   Take 3 tablets (60 mg total) by mouth daily.   15  tablet   0     BP 141/118  Pulse 121  Temp 98 F (36.7 C) (Oral)  Resp 22  Ht 5\' 9"  (1.753 m)  Wt 120 lb (54.432 kg)  BMI 17.72 kg/m2  SpO2 95%  Physical Exam  Constitutional: He is oriented to person, place, and time. He appears well-developed and well-nourished.  HENT:  Head: Normocephalic and atraumatic.  Eyes: Conjunctivae normal and EOM are normal. Pupils are equal, round, and reactive to light.  Neck: Trachea normal. Neck supple. No thyromegaly present.  Cardiovascular: Normal rate, regular rhythm, S1 normal, S2 normal and normal pulses.     No systolic murmur is present   No diastolic murmur is present  Pulses:      Radial pulses are 2+ on the right side, and 2+ on the left side.  Pulmonary/Chest: Effort normal. He has no rhonchi. He has no rales.       Right lateral rib tenderness to palpation. No crepitus. No  erythema or swelling. Equal bilateral breath sounds with mild expiratory wheezes present. No splinting. No respiratory distress.  Abdominal: Soft. Normal appearance and bowel sounds are normal. He exhibits no mass. There is no tenderness. There is no rebound, no guarding, no CVA tenderness and negative Murphy's sign.       No right upper quadrant tenderness. No acute abdomen.  Musculoskeletal:       BLE:s Calves nontender, no cords or erythema, negative Homans sign  Neurological: He is alert and oriented to person, place, and time. He has normal strength. No cranial nerve deficit or sensory deficit. GCS eye subscore is 4. GCS verbal subscore is 5. GCS motor subscore is 6.  Skin: Skin is warm and dry. No rash noted. He is not diaphoretic.  Psychiatric: His speech is normal.       Cooperative and appropriate    ED Course  Procedures (including critical care time)  IM Dilaudid Inhaler and 2 puffs albuterol provided - improved wheezing and improved pain.  Ice provided.   1. Rib injury    Dg Chest 2 View  09/16/2012  *RADIOLOGY REPORT*  Clinical Data: Larey Seat 10 days ago with right-sided chest pain  CHEST - 2 VIEW  Comparison: Chest x-ray of 06/03/2012  Findings: The lungs are clear but hyperaerated consistent with COPD.  No pneumothorax is seen.  No effusion is noted.  Mediastinal contours are stable.  The heart is within normal limits in size. There is some peribronchial thickening which may indicate bronchitis.  A partially compressed T9 vertebral body appears stable.  IMPRESSION:  1.  COPD.  No active lung disease. 2.  No pneumothorax.  No acute bony abnormality.   Original Report Authenticated By: Dwyane Dee, M.D.      MDM  Right lateral rib injury over a week ago. Old records reviewed including chest x-ray dated 09/16/2012 as above and ED visit from that date. Medications provided - intramuscular narcotics. Pain improved. Sats adequate. No new trauma. No shortness of breath beyond baseline  COPD. Do not feel repeat imaging is indicated. Vital signs and nursing notes reviewed and considered. Stable for discharge home with prescription NSAIDs and recommended follow up with primary care physician. Patient agrees to all discharge and follow up instructions.        Sunnie Nielsen, MD 09/23/12 [phone]

## 2012-09-23 NOTE — ED Notes (Signed)
Pt c/o right sided rib pain.  States "It feels like my rib is popping." Denies SOB.

## 2012-10-08 ENCOUNTER — Inpatient Hospital Stay (HOSPITAL_COMMUNITY)
Admission: EM | Admit: 2012-10-08 | Discharge: 2012-10-11 | DRG: 439 | Disposition: A | Discharge status: Home / Self Care | Payer: Medicaid Other | Attending: Family Medicine | Admitting: Family Medicine

## 2012-10-08 ENCOUNTER — Encounter (HOSPITAL_COMMUNITY): Payer: Self-pay | Admitting: Emergency Medicine

## 2012-10-08 ENCOUNTER — Emergency Department (HOSPITAL_COMMUNITY): Payer: Medicaid Other

## 2012-10-08 DIAGNOSIS — R7402 Elevation of levels of lactic acid dehydrogenase (LDH): Secondary | ICD-10-CM | POA: Diagnosis present

## 2012-10-08 DIAGNOSIS — R7401 Elevation of levels of liver transaminase levels: Secondary | ICD-10-CM | POA: Diagnosis present

## 2012-10-08 DIAGNOSIS — K7010 Alcoholic hepatitis without ascites: Secondary | ICD-10-CM | POA: Diagnosis present

## 2012-10-08 DIAGNOSIS — G40909 Epilepsy, unspecified, not intractable, without status epilepticus: Secondary | ICD-10-CM | POA: Diagnosis present

## 2012-10-08 DIAGNOSIS — Z9119 Patient's noncompliance with other medical treatment and regimen: Secondary | ICD-10-CM

## 2012-10-08 DIAGNOSIS — F1010 Alcohol abuse, uncomplicated: Secondary | ICD-10-CM | POA: Diagnosis present

## 2012-10-08 DIAGNOSIS — J4489 Other specified chronic obstructive pulmonary disease: Secondary | ICD-10-CM | POA: Diagnosis present

## 2012-10-08 DIAGNOSIS — F10939 Alcohol use, unspecified with withdrawal, unspecified: Secondary | ICD-10-CM | POA: Diagnosis present

## 2012-10-08 DIAGNOSIS — K861 Other chronic pancreatitis: Secondary | ICD-10-CM | POA: Diagnosis present

## 2012-10-08 DIAGNOSIS — Z79899 Other long term (current) drug therapy: Secondary | ICD-10-CM

## 2012-10-08 DIAGNOSIS — Z91199 Patient's noncompliance with other medical treatment and regimen due to unspecified reason: Secondary | ICD-10-CM

## 2012-10-08 DIAGNOSIS — K859 Acute pancreatitis without necrosis or infection, unspecified: Principal | ICD-10-CM | POA: Diagnosis present

## 2012-10-08 DIAGNOSIS — F17200 Nicotine dependence, unspecified, uncomplicated: Secondary | ICD-10-CM | POA: Diagnosis present

## 2012-10-08 DIAGNOSIS — J449 Chronic obstructive pulmonary disease, unspecified: Secondary | ICD-10-CM | POA: Diagnosis present

## 2012-10-08 DIAGNOSIS — E876 Hypokalemia: Secondary | ICD-10-CM | POA: Diagnosis present

## 2012-10-08 DIAGNOSIS — D7589 Other specified diseases of blood and blood-forming organs: Secondary | ICD-10-CM | POA: Diagnosis present

## 2012-10-08 DIAGNOSIS — F10239 Alcohol dependence with withdrawal, unspecified: Secondary | ICD-10-CM | POA: Diagnosis present

## 2012-10-08 LAB — CBC WITH DIFFERENTIAL/PLATELET
Basophils Absolute: 0 10*3/uL (ref 0.0–0.1)
HCT: 43.2 % (ref 39.0–52.0)
Hemoglobin: 15.6 g/dL (ref 13.0–17.0)
Lymphocytes Relative: 24 % (ref 12–46)
Monocytes Absolute: 1.3 10*3/uL — ABNORMAL HIGH (ref 0.1–1.0)
Monocytes Relative: 9 % (ref 3–12)
Neutro Abs: 8.9 10*3/uL — ABNORMAL HIGH (ref 1.7–7.7)
Neutrophils Relative %: 66 % (ref 43–77)
RDW: 13.1 % (ref 11.5–15.5)
WBC: 13.5 10*3/uL — ABNORMAL HIGH (ref 4.0–10.5)

## 2012-10-08 LAB — URINALYSIS, ROUTINE W REFLEX MICROSCOPIC
Bilirubin Urine: NEGATIVE
Glucose, UA: NEGATIVE mg/dL
Hgb urine dipstick: NEGATIVE
Ketones, ur: 40 mg/dL — AB
Leukocytes, UA: NEGATIVE
Nitrite: NEGATIVE
Protein, ur: NEGATIVE mg/dL
Specific Gravity, Urine: >1.03 — ABNORMAL HIGH (ref 1.005–1.030)
Urobilinogen, UA: 0.2 mg/dL (ref 0.0–1.0)
pH: 5.5 (ref 5.0–8.0)

## 2012-10-08 LAB — AMMONIA: Ammonia: 39 umol/L (ref 11–60)

## 2012-10-08 LAB — COMPREHENSIVE METABOLIC PANEL
AST: 35 U/L (ref 0–37)
Albumin: 4.3 g/dL (ref 3.5–5.2)
Alkaline Phosphatase: 128 U/L — ABNORMAL HIGH (ref 39–117)
CO2: 22 mEq/L (ref 19–32)
Chloride: 92 mEq/L — ABNORMAL LOW (ref 96–112)
Creatinine, Ser: 0.43 mg/dL — ABNORMAL LOW (ref 0.50–1.35)
GFR calc non Af Amer: >90 mL/min (ref >90)
Potassium: 3.4 mEq/L — ABNORMAL LOW (ref 3.5–5.1)
Total Bilirubin: 0.5 mg/dL (ref 0.3–1.2)

## 2012-10-08 LAB — MAGNESIUM: Magnesium: 1.5 mg/dL (ref 1.5–2.5)

## 2012-10-08 MED ORDER — PANTOPRAZOLE SODIUM 40 MG IV SOLR
40.0000 mg | Freq: Once | INTRAVENOUS | Status: AC
  Administered 2012-10-08: 40 mg via INTRAVENOUS
  Filled 2012-10-08: qty 40

## 2012-10-08 MED ORDER — POTASSIUM CHLORIDE 10 MEQ/100ML IV SOLN
INTRAVENOUS | Status: AC
  Filled 2012-10-08: qty 100

## 2012-10-08 MED ORDER — ONDANSETRON HCL 4 MG/2ML IJ SOLN
4.0000 mg | Freq: Once | INTRAMUSCULAR | Status: AC
  Administered 2012-10-08: 4 mg via INTRAVENOUS
  Filled 2012-10-08: qty 2

## 2012-10-08 MED ORDER — MORPHINE SULFATE 4 MG/ML IJ SOLN
6.0000 mg | Freq: Once | INTRAMUSCULAR | Status: AC
  Administered 2012-10-08: 6 mg via INTRAVENOUS
  Filled 2012-10-08: qty 2

## 2012-10-08 MED ORDER — NICOTINE 21 MG/24HR TD PT24
21.0000 mg | MEDICATED_PATCH | Freq: Every day | TRANSDERMAL | Status: DC
  Administered 2012-10-08 – 2012-10-10 (×3): 21 mg via TRANSDERMAL
  Filled 2012-10-08 (×3): qty 1

## 2012-10-08 MED ORDER — HYDROMORPHONE HCL PF 1 MG/ML IJ SOLN
1.0000 mg | Freq: Once | INTRAMUSCULAR | Status: AC
  Administered 2012-10-08: 1 mg via INTRAVENOUS
  Filled 2012-10-08: qty 1

## 2012-10-08 MED ORDER — HYDROMORPHONE HCL PF 1 MG/ML IJ SOLN
2.0000 mg | INTRAMUSCULAR | Status: DC | PRN
  Administered 2012-10-08 – 2012-10-10 (×12): 2 mg via INTRAVENOUS
  Filled 2012-10-08 (×13): qty 2

## 2012-10-08 MED ORDER — CARBAMAZEPINE 200 MG PO TABS
200.0000 mg | ORAL_TABLET | Freq: Three times a day (TID) | ORAL | Status: DC
  Administered 2012-10-08 – 2012-10-10 (×8): 200 mg via ORAL
  Filled 2012-10-08 (×8): qty 1

## 2012-10-08 MED ORDER — HYDROMORPHONE HCL PF 1 MG/ML IJ SOLN
2.0000 mg | Freq: Once | INTRAMUSCULAR | Status: AC
  Administered 2012-10-08: 2 mg via INTRAVENOUS
  Filled 2012-10-08: qty 2

## 2012-10-08 MED ORDER — ENOXAPARIN SODIUM 40 MG/0.4ML ~~LOC~~ SOLN
40.0000 mg | SUBCUTANEOUS | Status: DC
  Administered 2012-10-08 – 2012-10-10 (×3): 40 mg via SUBCUTANEOUS
  Filled 2012-10-08 (×3): qty 0.4

## 2012-10-08 MED ORDER — HYDROMORPHONE HCL PF 1 MG/ML IJ SOLN
1.0000 mg | INTRAMUSCULAR | Status: DC | PRN
  Administered 2012-10-08: 1 mg via INTRAVENOUS
  Filled 2012-10-08: qty 1

## 2012-10-08 MED ORDER — LORAZEPAM 0.5 MG PO TABS
0.5000 mg | ORAL_TABLET | Freq: Four times a day (QID) | ORAL | Status: DC | PRN
  Administered 2012-10-08 – 2012-10-09 (×3): 0.5 mg via ORAL
  Filled 2012-10-08 (×3): qty 1

## 2012-10-08 MED ORDER — SODIUM CHLORIDE 0.9 % IV BOLUS (SEPSIS)
1000.0000 mL | Freq: Once | INTRAVENOUS | Status: AC
  Administered 2012-10-08: 1000 mL via INTRAVENOUS

## 2012-10-08 MED ORDER — LEVETIRACETAM 500 MG PO TABS
500.0000 mg | ORAL_TABLET | Freq: Two times a day (BID) | ORAL | Status: DC
  Administered 2012-10-08 – 2012-10-10 (×5): 500 mg via ORAL
  Filled 2012-10-08 (×5): qty 1

## 2012-10-08 MED ORDER — POTASSIUM CHLORIDE 10 MEQ/100ML IV SOLN
10.0000 meq | INTRAVENOUS | Status: AC
  Administered 2012-10-08 (×3): 10 meq via INTRAVENOUS
  Filled 2012-10-08 (×2): qty 100

## 2012-10-08 MED ORDER — THIAMINE HCL 100 MG/ML IJ SOLN
100.0000 mg | Freq: Every day | INTRAMUSCULAR | Status: AC
  Administered 2012-10-08 – 2012-10-10 (×3): 100 mg via INTRAVENOUS
  Filled 2012-10-08 (×3): qty 2

## 2012-10-08 NOTE — ED Notes (Signed)
Pt c/o abdominal pain. Pt has hx of pancreatitis and states he was drinking liquor last night.

## 2012-10-08 NOTE — ED Provider Notes (Signed)
History     CSN: [id number]  Arrival date & time 10/08/12  1249   First MD Initiated Contact with Patient 10/08/12 1320      Chief Complaint  Patient presents with  . Abdominal Pain  . Nausea    (Consider location/radiation/quality/duration/timing/severity/associated sxs/prior treatment) Patient is a 36 y.o. male presenting with abdominal pain. The history is provided by the patient (the pt complains of abd pain and vomiting.  pt drank some alcohol last night).  Abdominal Pain The primary symptoms of the illness include abdominal pain. The primary symptoms of the illness do not include fatigue or diarrhea. The current episode started 3 to 5 hours ago. The onset of the illness was sudden. The problem has not changed since onset. The illness is associated with alcohol use. The patient states that she believes she is currently not pregnant. The patient has not had a change in bowel habit. Risk factors: hx of pancreatitis. Symptoms associated with the illness do not include chills, hematuria, frequency or back pain. Significant associated medical issues include PUD.    Past Medical History  Diagnosis Date  . Pancreatitis   . Seizures   . Back pain   . ETOH abuse   . COPD (chronic obstructive pulmonary disease)   . SAH (subarachnoid hemorrhage)   . Macrocytosis 08/17/2011  . Bradycardia 08/17/2011    Past Surgical History  Procedure Date  . Back surgery   . Septoplasty     Family History  Problem Relation Age of Onset  . Seizures Father   . Alcohol abuse Father   . Coronary artery disease Mother     deceased age 73    History  Substance Use Topics  . Smoking status: Current Every Day Smoker -- 1.0 packs/day for 20 years    Types: Cigarettes  . Smokeless tobacco: Never Used  . Alcohol Use: Yes     Comment: occasionally      Review of Systems  Constitutional: Negative for chills and fatigue.  HENT: Negative for congestion, sinus pressure and ear discharge.    Eyes: Negative for discharge.  Respiratory: Negative for cough.   Cardiovascular: Negative for chest pain.  Gastrointestinal: Positive for abdominal pain. Negative for diarrhea.  Genitourinary: Negative for frequency and hematuria.  Musculoskeletal: Negative for back pain.  Skin: Negative for rash.  Neurological: Negative for seizures and headaches.  Hematological: Negative.   Psychiatric/Behavioral: Negative for hallucinations.    Allergies  Review of patient's allergies indicates no known allergies.  Home Medications   Current Outpatient Rx  Name  Route  Sig  Dispense  Refill  . CARBAMAZEPINE 200 MG PO TABS   Oral   Take 200-400 mg by mouth 3 (three) times daily. Take 2 tablets in the morning, 1 tablet at lunch and 2 tablets at bedtime.         . IBUPROFEN 800 MG PO TABS   Oral   Take 800 mg by mouth 3 (three) times daily as needed.         Marland Kitchen LEVETIRACETAM 500 MG PO TABS   Oral   Take 500 mg by mouth 2 (two) times daily.          . OXYCODONE-ACETAMINOPHEN 5-500 MG PO CAPS   Oral   Take 1 capsule by mouth every 6 (six) hours as needed. For pain           BP 169/93  Pulse 89  Temp 98.8 F (37.1 C) (Oral)  Resp 20  Ht  5\' 9"  (1.753 m)  Wt 125 lb (56.7 kg)  BMI 18.46 kg/m2  SpO2 94%  Physical Exam  Constitutional: He is oriented to person, place, and time. He appears well-developed.  HENT:  Head: Normocephalic and atraumatic.  Eyes: Conjunctivae normal and EOM are normal. No scleral icterus.  Neck: Neck supple. No thyromegaly present.  Cardiovascular: Normal rate and regular rhythm.  Exam reveals no gallop and no friction rub.   No murmur heard. Pulmonary/Chest: No stridor. He has no wheezes. He has no rales. He exhibits no tenderness.  Abdominal: He exhibits no distension. There is tenderness. There is no rebound.  Musculoskeletal: Normal range of motion. He exhibits no edema.  Lymphadenopathy:    He has no cervical adenopathy.  Neurological: He is  oriented to person, place, and time. Coordination normal.  Skin: No rash noted. No erythema.  Psychiatric: He has a normal mood and affect. His behavior is normal.    ED Course  Procedures (including critical care time)  Labs Reviewed  CBC WITH DIFFERENTIAL - Abnormal; Notable for the following:    WBC 13.5 (*)     RBC 4.03 (*)     MCV 107.2 (*)     MCH 38.7 (*)     MCHC 36.1 (*)     Neutro Abs 8.9 (*)     Monocytes Absolute 1.3 (*)     All other components within normal limits  COMPREHENSIVE METABOLIC PANEL - Abnormal; Notable for the following:    Sodium 133 (*)     Potassium 3.4 (*)     Chloride 92 (*)     Creatinine, Ser 0.43 (*)     Alkaline Phosphatase 128 (*)     All other components within normal limits  LIPASE, BLOOD - Abnormal; Notable for the following:    Lipase 677 (*)     All other components within normal limits   Dg Abd Acute W/chest  10/08/2012  *RADIOLOGY REPORT*  Clinical Data: Severe abdominal pain.  History of pancreatitis  ACUTE ABDOMEN SERIES (ABDOMEN 2 VIEW & CHEST 1 VIEW)  Comparison: 09/16/2012  Findings: COPD with pulmonary hyperinflation.  Negative for infiltrate or effusion.  No heart failure or mass lesion.  Normal bowel gas pattern.  No bowel obstruction or free air.  No renal calculi and no significant bony abnormality.  IMPRESSION: COPD.  No acute cardiopulmonary disease.  Negative abdomen.   Original Report Authenticated By: Janeece Riggers, M.D.      1. Pancreatitis       MDM          Benny Lennert, MD 10/08/12 [phone]

## 2012-10-08 NOTE — H&P (Signed)
[id number] 

## 2012-10-08 NOTE — ED Notes (Signed)
Hx of pancreatitis. Pt states he drank liquor last night and has been having pain and nausea since.

## 2012-10-08 NOTE — ED Notes (Signed)
Paged [name] to [phone]

## 2012-10-08 NOTE — Progress Notes (Signed)
ANTICOAGULATION CONSULT NOTE - Initial Consult  Pharmacy Consult for Lovenox Indication: VTE prophylaxis  No Known Allergies  Patient Measurements: Height: 5\' 9"  (175.3 cm) Weight: 125 lb (56.7 kg) IBW/kg (Calculated) : 70.7   Vital Signs: Temp: 98.8 F (37.1 C) (12/11 1256) Temp src: Oral (12/11 1256) BP: 169/93 mmHg (12/11 1451) Pulse Rate: 89  (12/11 1451)  Labs:  Basename 10/08/12 1258  HGB 15.6  HCT 43.2  PLT 200  APTT --  LABPROT --  INR --  HEPARINUNFRC --  CREATININE 0.43*  CKTOTAL --  CKMB --  TROPONINI --    Estimated Creatinine Clearance: 101.4 ml/min (by C-G formula based on Cr of 0.43).  Medical History: Past Medical History  Diagnosis Date  . Pancreatitis   . Seizures   . Back pain   . ETOH abuse   . COPD (chronic obstructive pulmonary disease)   . SAH (subarachnoid hemorrhage)   . Macrocytosis 08/17/2011  . Bradycardia 08/17/2011   Medications:  Scheduled:    . enoxaparin (LOVENOX) injection  40 mg Subcutaneous Q24H  . [COMPLETED] HYDROmorphone  1 mg Intravenous Once  . [COMPLETED] HYDROmorphone  2 mg Intravenous Once  . levETIRAcetam  500 mg Oral BID  . [COMPLETED] morphine  6 mg Intravenous Once  . [COMPLETED] ondansetron  4 mg Intravenous Once  . [COMPLETED] pantoprazole  40 mg Intravenous Once    Assessment: 36yo male with good renal fxn.  Estimated Creatinine Clearance: 101.4 ml/min (by C-G formula based on Cr of 0.43). Goal of Therapy:  VTE prophylaxis Monitor platelets by anticoagulation protocol: Yes   Plan:  Lovenox 40mg  SQ q24hrs CBC per protocol  Valrie Hart A 10/08/2012,4:13 PM

## 2012-10-09 LAB — BASIC METABOLIC PANEL
Calcium: 8.3 mg/dL — ABNORMAL LOW (ref 8.4–10.5)
GFR calc non Af Amer: >90 mL/min (ref >90)
Glucose, Bld: 90 mg/dL (ref 70–99)
Potassium: 3.2 mEq/L — ABNORMAL LOW (ref 3.5–5.1)
Sodium: 132 mEq/L — ABNORMAL LOW (ref 135–145)

## 2012-10-09 LAB — HEPATIC FUNCTION PANEL
AST: 28 U/L (ref 0–37)
Bilirubin, Direct: 0.2 mg/dL (ref 0.0–0.3)
Indirect Bilirubin: 0.5 mg/dL (ref 0.3–0.9)
Total Bilirubin: 0.7 mg/dL (ref 0.3–1.2)

## 2012-10-09 LAB — PROTIME-INR
INR: 0.95 (ref 0.00–1.49)
Prothrombin Time: 12.6 seconds (ref 11.6–15.2)

## 2012-10-09 LAB — LIPASE, BLOOD: Lipase: 241 U/L — ABNORMAL HIGH (ref 11–59)

## 2012-10-09 MED ORDER — POTASSIUM CHLORIDE 10 MEQ/100ML IV SOLN
10.0000 meq | INTRAVENOUS | Status: AC
  Administered 2012-10-09 (×3): 10 meq via INTRAVENOUS
  Filled 2012-10-09 (×3): qty 100

## 2012-10-09 MED ORDER — POTASSIUM CHLORIDE 10 MEQ/100ML IV SOLN
INTRAVENOUS | Status: AC
  Administered 2012-10-09: 10 meq
  Filled 2012-10-09: qty 200

## 2012-10-09 MED ORDER — LISINOPRIL 10 MG PO TABS
10.0000 mg | ORAL_TABLET | Freq: Once | ORAL | Status: AC
  Administered 2012-10-09: 10 mg via ORAL
  Filled 2012-10-09: qty 1

## 2012-10-09 NOTE — Progress Notes (Signed)
UR Chart Review Completed  

## 2012-10-09 NOTE — Care Management Note (Unsigned)
    Page 1 of 1   10/09/2012     3:21:05 PM   CARE MANAGEMENT NOTE 10/09/2012  Patient:  [name], [name]   Account Number:  [id number]  Date Initiated:  10/09/2012  Documentation initiated by:  Sharrie Rothman  Subjective/Objective Assessment:   Pt admitted from home with pancreatitis. Pt admitted to drinking beer and hard alcohol. Pt lives with his girlfriend and will return home with her at discharge.     Action/Plan:   CSW made aware of pts admission. Primary MD offered AA and other alcohol cessation interventions. Pt not receptive to those at this time.   Anticipated DC Date:  10/11/2012   Anticipated DC Plan:  HOME/SELF CARE  In-house referral  Clinical Social Worker      DC Planning Services  CM consult      Choice offered to / List presented to:             Status of service:  Completed, signed off Medicare Important Message given?   (If response is "NO", the following Medicare IM given date fields will be blank) Date Medicare IM given:   Date Additional Medicare IM given:    Discharge Disposition:    Per UR Regulation:    If discussed at Long Length of Stay Meetings, dates discussed:    Comments:  10/09/12 1517 Arlyss Queen, RN BSN CM

## 2012-10-09 NOTE — Progress Notes (Signed)
[id number] 

## 2012-10-09 NOTE — H&P (Signed)
[name], [name] NO.:  [id number]  MEDICAL RECORD NO.:  [id number]  LOCATION:  [facility]                         FACILITY:  [facility]  PHYSICIAN:  Melvyn Novas, MDDATE OF BIRTH:  05-03-1976  DATE OF ADMISSION:  10/08/2012 DATE OF DISCHARGE:  LH                             HISTORY & PHYSICAL   The patient is a 36 year old white male, known ethanol abuse, who apparently had 6/8 shots of brandy yesterday along with 3-4 beers ending the last night, woke up this morning with some diffuse abdominal pain, epigastric pain.  Presents with an elevated lipases in the 600s.  The EtOH transaminitis is not very elevated and clinically with pancreatitis.  Abdominal series shows no evidence of perforation or air. The patient was admitted with alcoholic pancreatitis and hepatitis, will be treated with analgesics, fluids, monitoring daily liver function, lipases, and hemoglobin, hematocrit, and electrolytes.  He likewise presents with potassium of 3.4, which will be rectified.  PAST MEDICAL HISTORY:  Significant for recurrent alcohol pancreatitis, hepatitis, history of seizures due to subarachnoid hemorrhage, chronic low back pain, ethanol abuse, COPD, subarachnoid hemorrhage, and microcytosis, history of bradycardia.  PAST SURGICAL HISTORY:  Remarkable for lumbosacral laminectomy and a septoplasty.  SMOKING STATUS:  He smokes 1 pack a day for the last 20 years.  Alcohol: Heavily imbibes daily.  CURRENT MEDICINES:  Tegretol 200 mg p.o. t.i.d., ibuprofen 800 t.i.d. p.r.n., Keppra 500 mg p.o. b.i.d. and oxycodone 5 mg p.o. q.6h.  PHYSICAL EXAMINATION:  VITAL SIGNS:  Blood pressure 169/63, temperature 98.8, respiratory rate is 20, O2 sat is 94%. EYES:  PERRLA intact.  Sclerae clear.  Conjunctivae pink. NECK:  No JVD.  No carotid bruits.  No thyromegaly.  No thyroid bruits. LUNGS:  Prolonged expiratory phase.  Scattered rhonchi.  Diminished breath sounds at bases.  No  rales or wheeze appreciable. HEART:  Regular rhythm.  No murmurs, gallops, heaves, or rubs. ABDOMEN:  Diffuse tenderness, specifically in the epigastric region. Guarding.  No rebound.  Bowel sounds are normoactive.  No peristaltic rushes. NEUROLOGIC:  Cranial nerves II through XII grossly intact.  The patient moves all 4 extremities.  Plantars are downgoing.  IMPRESSION:  As follows: 1. 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 2. Macrocytosis. 3. Seizure disorder. 4. History of subarachnoid hemorrhage. 5. Chronic noncompliance. 6. Chronic obstructive pulmonary disease.  PLAN:  To admit, give IV Dilaudid, serial blood works on transaminases and lipases, clear liquid diet, monitor electrolytes daily and CBC, and I will make further recommendations as the database expands.     Melvyn Novas, MD     RMD/MEDQ  D:  10/08/2012  T:  10/09/2012  Job:  [id number]

## 2012-10-09 NOTE — Progress Notes (Signed)
Patient's blood pressure has been staying high during his admission even after he has gotten pain medicine to get his pain under control. Patient's blood pressure was 157/105. Doctor was notified and new orders were given for a one time dose of blood pressure medicine. Will continue to monitor patient.

## 2012-10-09 NOTE — Progress Notes (Signed)
Patient asked if he could have a nicotine patch while in the hospital. Doctor was called and new orders were given for a 21mg  Nicotine patch.

## 2012-10-10 LAB — BASIC METABOLIC PANEL
Chloride: 97 mEq/L (ref 96–112)
GFR calc Af Amer: >90 mL/min (ref >90)
GFR calc non Af Amer: >90 mL/min (ref >90)
Potassium: 3.7 mEq/L (ref 3.5–5.1)
Sodium: 135 mEq/L (ref 135–145)

## 2012-10-10 LAB — PROTIME-INR
INR: 1.01 (ref 0.00–1.49)
Prothrombin Time: 13.2 seconds (ref 11.6–15.2)

## 2012-10-10 LAB — HEPATIC FUNCTION PANEL
ALT: 25 U/L (ref 0–53)
Bilirubin, Direct: 0.2 mg/dL (ref 0.0–0.3)
Indirect Bilirubin: 0.3 mg/dL (ref 0.3–0.9)
Total Protein: 6.9 g/dL (ref 6.0–8.3)

## 2012-10-10 LAB — LIPASE, BLOOD: Lipase: 137 U/L — ABNORMAL HIGH (ref 11–59)

## 2012-10-10 MED ORDER — ONDANSETRON HCL 4 MG/2ML IJ SOLN
4.0000 mg | INTRAMUSCULAR | Status: DC | PRN
  Administered 2012-10-10: 4 mg via INTRAVENOUS

## 2012-10-10 MED ORDER — ONDANSETRON HCL 4 MG/2ML IJ SOLN
INTRAMUSCULAR | Status: AC
  Administered 2012-10-10: 4 mg via INTRAVENOUS
  Filled 2012-10-10: qty 2

## 2012-10-10 MED ORDER — HYDROMORPHONE HCL PF 1 MG/ML IJ SOLN
0.5000 mg | INTRAMUSCULAR | Status: DC | PRN
  Administered 2012-10-10 – 2012-10-11 (×5): 0.5 mg via INTRAVENOUS
  Filled 2012-10-10 (×6): qty 1

## 2012-10-10 MED ORDER — OXYCODONE-ACETAMINOPHEN 5-325 MG PO TABS
1.0000 | ORAL_TABLET | Freq: Four times a day (QID) | ORAL | Status: DC | PRN
  Administered 2012-10-10 – 2012-10-11 (×3): 1 via ORAL
  Filled 2012-10-10 (×4): qty 1

## 2012-10-10 NOTE — Progress Notes (Signed)
[name], SANDATE NO.:  [id number]  MEDICAL RECORD NO.:  [id number]  LOCATION:  [facility]                         FACILITY:  [facility]  PHYSICIAN:  Melvyn Novas, MDDATE OF BIRTH:  Nov 27, 1975  DATE OF PROCEDURE: DATE OF DISCHARGE:                                PROGRESS NOTE   The patient has ethanolic pancreatitis combined with hypokalemia, potassium of 3.2 this morning.  No evidence of GI bleed.  Lipase was 600 yesterday down to 244, mildly elevated transaminases.  VITAL SIGNS:  Blood pressure 149/91, temperature 98, pulse is 79 and regular, respiratory rate is 20. LUNGS:  Clear to A and P with prolonged expiratory phase.  Scattered rhonchi.  Otherwise, no rales, no wheeze. HEART:  Regular rate and rhythm.  No murmurs, gallops, or rubs. ABDOMEN:  Positive epigastric tenderness.  No peristaltic rushes.  No guarding, rebound.  Plan is to continue current liquid diet, monitor electrolytes, give KCl 10 mEq x4.  Continue seizure medicines for which he has been noncompliant.  Nicotine patch and continue Dilaudid 2 mg IV q.3h. and Ativan protocol.     Melvyn Novas, MD     RMD/MEDQ  D:  10/09/2012  T:  10/10/2012  Job:  [id number]

## 2012-10-10 NOTE — Progress Notes (Signed)
ANTICOAGULATION CONSULT NOTE  Pharmacy Consult for Lovenox Indication: VTE prophylaxis  No Known Allergies  Patient Measurements: Height: 5\' 9"  (175.3 cm) Weight: 126 lb (57.153 kg) IBW/kg (Calculated) : 70.7   Vital Signs: Temp: 97.4 F (36.3 C) (12/13 0516) BP: 141/84 mmHg (12/13 0516) Pulse Rate: 70  (12/13 0516)  Labs:  Basename 10/10/12 0540 10/09/12 0557 10/08/12 1258  HGB -- -- 15.6  HCT -- -- 43.2  PLT -- -- 200  APTT -- -- --  LABPROT 13.2 12.6 --  INR 1.01 0.95 --  HEPARINUNFRC -- -- --  CREATININE 0.42* 0.32* 0.43*  CKTOTAL -- -- --  CKMB -- -- --  TROPONINI -- -- --    Estimated Creatinine Clearance: 102.3 ml/min (by C-G formula based on Cr of 0.42).  Medical History: Past Medical History  Diagnosis Date  . Pancreatitis   . Seizures   . Back pain   . ETOH abuse   . COPD (chronic obstructive pulmonary disease)   . SAH (subarachnoid hemorrhage)   . Macrocytosis 08/17/2011  . Bradycardia 08/17/2011   Medications:  Scheduled:     . carbamazepine  200 mg Oral TID  . enoxaparin (LOVENOX) injection  40 mg Subcutaneous Q24H  . levETIRAcetam  500 mg Oral BID  . nicotine  21 mg Transdermal Daily    Assessment: 35yo male continues on Lovenox for VTE px while hospitalized. Renal function has been stable. No bleeding noted.   Goal of Therapy:  Monitor platelets by anticoagulation protocol: Yes   Plan:  Lovenox 40mg  SQ q24hrs Monitor CBC & s/sx of bleeding Pharmacy to sign off. Please re-consult as needed.   Elson Clan 10/10/2012,10:49 AM

## 2012-10-10 NOTE — Progress Notes (Signed)
[name], [name] NO.:  [id number]  MEDICAL RECORD NO.:  [id number]  LOCATION:  [facility]                         FACILITY:  [facility]  PHYSICIAN:  Melvyn Novas, MDDATE OF BIRTH:  1976/02/27  DATE OF PROCEDURE: DATE OF DISCHARGE:                                PROGRESS NOTE   The patient has ethanol-induced pancreatitis, has concomitant hypokalemia, now corrected; COPD.  The patient is much better, still complains of some epigastric pain.  Blood pressure 141/84, temperature 97.4, pulse 70 and regular, respiratory rate is 20.  Lipase is now diminished from 600-241, now down to 137.  AST increased at 71.  Tolerating clear liquids.  We will advance diet today to full liquids.  Lungs are clear with a prolonged expiratory phase.  Scattered rhonchi.  No rales appreciable.  Heart; regular rhythm.  No murmurs, gallops, or rubs.  Abdomen is soft, some mild epigastric tenderness.  No guarding or rebound.  Plan right now is to decrease Dilaudid to 0.5 mg q.6h IV p.r.n.  Add oxycodone, Percocet 325 q.i.d. p.r.n., and consider early discharge tomorrow.     Melvyn Novas, MD     RMD/MEDQ  D:  10/10/2012  T:  10/10/2012  Job:  [id number]

## 2012-10-10 NOTE — Progress Notes (Signed)
Patient's bed alarm placed on due to his fall risk assessment and complaining of dizziness while in the bathroom.

## 2012-10-10 NOTE — Progress Notes (Signed)
[id number] 

## 2012-10-11 MED ORDER — OXYCODONE-ACETAMINOPHEN 5-500 MG PO CAPS: 1.0000 | ORAL_CAPSULE | Freq: Four times a day (QID) | ORAL | Status: DC | PRN

## 2012-10-11 MED ORDER — LORAZEPAM 0.5 MG PO TABS: 0.5000 mg | ORAL_TABLET | Freq: Three times a day (TID) | ORAL | Status: DC

## 2012-10-11 NOTE — Discharge Summary (Signed)
[id number] 

## 2012-10-11 NOTE — Discharge Summary (Signed)
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  The patient has been imbibing 6-12 drinks per day prior to admission, who was admitted with significantly elevated lipases in the 600 range, trended down to 137 prior to discharge and slightly above normal on day of discharge, and he had significant abdominal pain.  On admission, this trended down also.  He was placed on Ativan for ETOH withdrawal.  He has a known seizure disorder.  History of small subarachnoid hemorrhage from trauma, COPD, and tobacco abuse.  Patient has been noncompliant with all medicines.  Upon admission, he was reinstituted on his Tegretol as well as his Keppra 500 mg p.o. b.i.d. He had no seizure activity in hospital, placed on lorazepam 1 mg q.6h. This is diminished to progressively.  The patient continued to have mild pain and felt he achieved maximum therapeutic benefit of his inpatient stay, intravenous Dilaudid was discontinued.  The patient was subsequently discharged with a strong advice to attend AA not to have any alcohol and to resume all medicines including seizure medicines.  His discharge medicines included lorazepam 0.5 mg p.o. t.i.d. #21 no refills for 1 week only, Tylox 5/500 one p.o. q.i.d. for 1 week #30 with no refills.  Distributed Tegretol 200 mg tablets p.o. t.i.d., Keppra 500 mg p.o. b.i.d., and the patient will follow up in the office within 3-4 days' time to check electrolytes, amylase level, lipase level, and CBC. Patient understands he should attend AA and avoid all substance abuse.     Melvyn Novas, MD     RMD/MEDQ   D:  10/11/2012  T:  10/11/2012  Job:  [id number]

## 2012-10-19 ENCOUNTER — Emergency Department (HOSPITAL_COMMUNITY)
Admission: EM | Admit: 2012-10-19 | Discharge: 2012-10-19 | Disposition: A | Discharge status: Home / Self Care | Payer: Medicaid Other | Attending: Emergency Medicine | Admitting: Emergency Medicine

## 2012-10-19 ENCOUNTER — Encounter (HOSPITAL_COMMUNITY): Payer: Self-pay | Admitting: *Deleted

## 2012-10-19 ENCOUNTER — Emergency Department (HOSPITAL_COMMUNITY): Payer: Medicaid Other

## 2012-10-19 DIAGNOSIS — G40909 Epilepsy, unspecified, not intractable, without status epilepticus: Secondary | ICD-10-CM | POA: Insufficient documentation

## 2012-10-19 DIAGNOSIS — Z79899 Other long term (current) drug therapy: Secondary | ICD-10-CM | POA: Insufficient documentation

## 2012-10-19 DIAGNOSIS — G8929 Other chronic pain: Secondary | ICD-10-CM | POA: Insufficient documentation

## 2012-10-19 DIAGNOSIS — R000 Tachycardia, unspecified: Secondary | ICD-10-CM | POA: Insufficient documentation

## 2012-10-19 DIAGNOSIS — Z9889 Other specified postprocedural states: Secondary | ICD-10-CM | POA: Insufficient documentation

## 2012-10-19 DIAGNOSIS — F17200 Nicotine dependence, unspecified, uncomplicated: Secondary | ICD-10-CM | POA: Insufficient documentation

## 2012-10-19 DIAGNOSIS — R0789 Other chest pain: Secondary | ICD-10-CM | POA: Insufficient documentation

## 2012-10-19 DIAGNOSIS — J449 Chronic obstructive pulmonary disease, unspecified: Secondary | ICD-10-CM | POA: Insufficient documentation

## 2012-10-19 DIAGNOSIS — R062 Wheezing: Secondary | ICD-10-CM | POA: Insufficient documentation

## 2012-10-19 DIAGNOSIS — R0602 Shortness of breath: Secondary | ICD-10-CM | POA: Insufficient documentation

## 2012-10-19 DIAGNOSIS — Z8679 Personal history of other diseases of the circulatory system: Secondary | ICD-10-CM | POA: Insufficient documentation

## 2012-10-19 DIAGNOSIS — R110 Nausea: Secondary | ICD-10-CM | POA: Insufficient documentation

## 2012-10-19 DIAGNOSIS — J189 Pneumonia, unspecified organism: Secondary | ICD-10-CM

## 2012-10-19 DIAGNOSIS — J4489 Other specified chronic obstructive pulmonary disease: Secondary | ICD-10-CM | POA: Insufficient documentation

## 2012-10-19 DIAGNOSIS — Z8719 Personal history of other diseases of the digestive system: Secondary | ICD-10-CM | POA: Insufficient documentation

## 2012-10-19 DIAGNOSIS — R6883 Chills (without fever): Secondary | ICD-10-CM | POA: Insufficient documentation

## 2012-10-19 DIAGNOSIS — F1010 Alcohol abuse, uncomplicated: Secondary | ICD-10-CM | POA: Insufficient documentation

## 2012-10-19 MED ORDER — ALBUTEROL SULFATE (5 MG/ML) 0.5% IN NEBU
5.0000 mg | INHALATION_SOLUTION | Freq: Once | RESPIRATORY_TRACT | Status: AC
  Administered 2012-10-19: 5 mg via RESPIRATORY_TRACT
  Filled 2012-10-19: qty 1

## 2012-10-19 MED ORDER — ALBUTEROL SULFATE (5 MG/ML) 0.5% IN NEBU
2.5000 mg | INHALATION_SOLUTION | Freq: Once | RESPIRATORY_TRACT | Status: AC
  Administered 2012-10-19: 2.5 mg via RESPIRATORY_TRACT
  Filled 2012-10-19: qty 0.5

## 2012-10-19 MED ORDER — IBUPROFEN 800 MG PO TABS
800.0000 mg | ORAL_TABLET | Freq: Once | ORAL | Status: AC
  Administered 2012-10-19: 800 mg via ORAL
  Filled 2012-10-19: qty 1

## 2012-10-19 MED ORDER — LEVOFLOXACIN 500 MG PO TABS
500.0000 mg | ORAL_TABLET | Freq: Once | ORAL | Status: AC
  Administered 2012-10-19: 500 mg via ORAL
  Filled 2012-10-19: qty 1

## 2012-10-19 MED ORDER — ONDANSETRON HCL 4 MG PO TABS: 4.0000 mg | ORAL_TABLET | Freq: Four times a day (QID) | ORAL | Status: DC

## 2012-10-19 MED ORDER — ALBUTEROL SULFATE HFA 108 (90 BASE) MCG/ACT IN AERS: 1.0000 | INHALATION_SPRAY | Freq: Four times a day (QID) | RESPIRATORY_TRACT | Status: DC | PRN

## 2012-10-19 MED ORDER — IPRATROPIUM BROMIDE 0.02 % IN SOLN
0.5000 mg | Freq: Once | RESPIRATORY_TRACT | Status: AC
  Administered 2012-10-19: 0.5 mg via RESPIRATORY_TRACT
  Filled 2012-10-19: qty 2.5

## 2012-10-19 MED ORDER — LEVOFLOXACIN 500 MG PO TABS: 500.0000 mg | ORAL_TABLET | Freq: Every day | ORAL | Status: DC

## 2012-10-19 NOTE — ED Notes (Signed)
O2 Sats increased to 93% on oxygen.

## 2012-10-19 NOTE — ED Notes (Addendum)
Pt reports chest tightness, cough, and SOB for about 4 days. Reports productive cough and pain in upper back. Also reporting some nausea.  O2 sats 88 on room air, placed on 2 LPM O2 via [place].

## 2012-10-25 NOTE — ED Provider Notes (Signed)
History     CSN: [id number]  Arrival date & time 10/19/12  7829   First MD Initiated Contact with Patient 10/19/12 0500      Chief Complaint  Patient presents with  . Shortness of Breath  . Nasal Congestion    (Consider location/radiation/quality/duration/timing/severity/associated sxs/prior treatment) HPI OCTAVE MONTROSE is a 36 y.o. male with a h/opancreatitis, etoh abuse, chronic pain, COPD, SAH who presents to the Emergency Department complaining of productive cough, chest discomfort, nausea, wheezing x 4 days. Shortness of breath with exertion and with coughing. Denies fever but has had chills. Has taken no medicines.  PCP Dr. Janna Arch    Past Medical History  Diagnosis Date  . Pancreatitis   . Seizures   . Back pain   . ETOH abuse   . COPD (chronic obstructive pulmonary disease)   . SAH (subarachnoid hemorrhage)   . Macrocytosis 08/17/2011  . Bradycardia 08/17/2011    Past Surgical History  Procedure Date  . Back surgery   . Septoplasty     Family History  Problem Relation Age of Onset  . Seizures Father   . Alcohol abuse Father   . Coronary artery disease Mother     deceased age 46    History  Substance Use Topics  . Smoking status: Current Every Day Smoker -- 1.0 packs/day for 20 years    Types: Cigarettes  . Smokeless tobacco: Never Used  . Alcohol Use: Yes     Comment: occasionally      Review of Systems  Constitutional: Negative for fever.       10 Systems reviewed and are negative for acute change except as noted in the HPI.  HENT: Negative for congestion.   Eyes: Negative for discharge and redness.  Respiratory: Positive for cough, chest tightness, shortness of breath and wheezing.   Cardiovascular: Negative for chest pain.  Gastrointestinal: Positive for nausea. Negative for vomiting and abdominal pain.  Musculoskeletal: Negative for back pain.  Skin: Negative for rash.  Neurological: Negative for syncope, numbness and headaches.    Psychiatric/Behavioral:       No behavior change.    Allergies  Review of patient's allergies indicates no known allergies.  Home Medications   Current Outpatient Rx  Name  Route  Sig  Dispense  Refill  . ALBUTEROL SULFATE HFA 108 (90 BASE) MCG/ACT IN AERS   Inhalation   Inhale 1-2 puffs into the lungs every 6 (six) hours as needed for wheezing.   1 Inhaler   0   . CARBAMAZEPINE 200 MG PO TABS   Oral   Take 200-400 mg by mouth 3 (three) times daily. Take 2 tablets in the morning, 1 tablet at lunch and 2 tablets at bedtime.         Marland Kitchen LEVETIRACETAM 500 MG PO TABS   Oral   Take 500 mg by mouth 2 (two) times daily.          Marland Kitchen LEVOFLOXACIN 500 MG PO TABS   Oral   Take 1 tablet (500 mg total) by mouth daily.   7 tablet   0   . LORAZEPAM 0.5 MG PO TABS   Oral   Take 1 tablet (0.5 mg total) by mouth every 8 (eight) hours.   21 tablet   0   . ONDANSETRON HCL 4 MG PO TABS   Oral   Take 1 tablet (4 mg total) by mouth every 6 (six) hours.   12 tablet   0   .  OXYCODONE-ACETAMINOPHEN 5-500 MG PO CAPS   Oral   Take 1 capsule by mouth every 6 (six) hours as needed. For pain   30 capsule   0     BP 122/77  Pulse 111  Temp 98.9 F (37.2 C) (Oral)  Resp 18  Ht 5\' 9"  (1.753 m)  Wt 125 lb (56.7 kg)  BMI 18.46 kg/m2  SpO2 96%  Physical Exam  Nursing note and vitals reviewed. Constitutional:       Thin, sallow complexion, chronically ill appearing man  HENT:  Head: Atraumatic.  Eyes: Right eye exhibits no discharge. Left eye exhibits no discharge.  Neck: Neck supple.  Cardiovascular:       tachycardia  Pulmonary/Chest: Effort normal. He has wheezes. He exhibits no tenderness.       Coarse harsh cough, rhonchi to  left chest  Abdominal: Soft. There is no tenderness. There is no rebound.  Musculoskeletal: He exhibits no tenderness.       Baseline ROM, no obvious new focal weakness.  Neurological:       Mental status and motor strength appears baseline for  patient and situation.  Skin: No rash noted.  Psychiatric: He has a normal mood and affect.    ED Course  Procedures (including critical care time)  Dg Chest Portable 1 View  10/19/2012  *RADIOLOGY REPORT*  Clinical Data: Shortness of breath  PORTABLE CHEST - 1 VIEW  Comparison: 10/08/2012  Findings: Interval development of left upper lobe consolidation, in keeping with pneumonia.  No cavitation. There is background hyperinflation/interstitial coarsening.  Cardiomediastinal contours within normal limits. No pleural effusion or pneumothorax.  No acute osseous finding.  IMPRESSION: Left upper lobe pneumonia has developed in the interval. Recommend radiograph follow-up after therapy to document resolution.   Original Report Authenticated By: Jearld Lesch, M.D.      1. Community acquired pneumonia       MDM  Patient wolidation c/w pna. Initial O2 sats after walking to the room were 88%. Given albuterol/atrovent nebulizer tith COPD presents with productive cough, wheezing x 4 days. Chest xray shows Left upper lobe consolidation c/w pna. Given albuterol/atrovent nebulizer threatment with improvement. O2 sats improved to 96%. Given additional nebulizer treatment . Initiated antibiotic  Therapy. Pt stable in ED with no significant deterioration in condition.The patient appears reasonably screened and/or stabilized for discharge and I doubt any other medical condition or other [facility] requiring further screening, evaluation, or treatment in the ED at this time prior to discharge.        Nicoletta Dress. Colon Branch, MD 10/25/12 [phone]

## 2012-10-26 ENCOUNTER — Encounter (HOSPITAL_COMMUNITY): Payer: Self-pay

## 2012-10-26 ENCOUNTER — Emergency Department (HOSPITAL_COMMUNITY): Payer: Medicaid Other

## 2012-10-26 ENCOUNTER — Inpatient Hospital Stay (HOSPITAL_COMMUNITY)
Admission: EM | Admit: 2012-10-26 | Discharge: 2012-10-29 | DRG: 178 | Disposition: A | Discharge status: Home / Self Care | Payer: Medicaid Other | Attending: Family Medicine | Admitting: Family Medicine

## 2012-10-26 DIAGNOSIS — J690 Pneumonitis due to inhalation of food and vomit: Principal | ICD-10-CM | POA: Diagnosis present

## 2012-10-26 DIAGNOSIS — G8929 Other chronic pain: Secondary | ICD-10-CM | POA: Diagnosis present

## 2012-10-26 DIAGNOSIS — K7010 Alcoholic hepatitis without ascites: Secondary | ICD-10-CM | POA: Diagnosis present

## 2012-10-26 DIAGNOSIS — G40909 Epilepsy, unspecified, not intractable, without status epilepticus: Secondary | ICD-10-CM | POA: Diagnosis present

## 2012-10-26 DIAGNOSIS — J4489 Other specified chronic obstructive pulmonary disease: Secondary | ICD-10-CM | POA: Diagnosis present

## 2012-10-26 DIAGNOSIS — Z79899 Other long term (current) drug therapy: Secondary | ICD-10-CM

## 2012-10-26 DIAGNOSIS — E871 Hypo-osmolality and hyponatremia: Secondary | ICD-10-CM | POA: Diagnosis present

## 2012-10-26 DIAGNOSIS — F1020 Alcohol dependence, uncomplicated: Secondary | ICD-10-CM | POA: Diagnosis present

## 2012-10-26 DIAGNOSIS — J189 Pneumonia, unspecified organism: Secondary | ICD-10-CM

## 2012-10-26 DIAGNOSIS — F17200 Nicotine dependence, unspecified, uncomplicated: Secondary | ICD-10-CM | POA: Diagnosis present

## 2012-10-26 DIAGNOSIS — J449 Chronic obstructive pulmonary disease, unspecified: Secondary | ICD-10-CM | POA: Diagnosis present

## 2012-10-26 DIAGNOSIS — E876 Hypokalemia: Secondary | ICD-10-CM | POA: Diagnosis present

## 2012-10-26 DIAGNOSIS — Z91199 Patient's noncompliance with other medical treatment and regimen due to unspecified reason: Secondary | ICD-10-CM

## 2012-10-26 DIAGNOSIS — F1910 Other psychoactive substance abuse, uncomplicated: Secondary | ICD-10-CM | POA: Diagnosis present

## 2012-10-26 DIAGNOSIS — Z9119 Patient's noncompliance with other medical treatment and regimen: Secondary | ICD-10-CM

## 2012-10-26 HISTORY — DX: Pneumonia, unspecified organism: J18.9

## 2012-10-26 LAB — COMPREHENSIVE METABOLIC PANEL
ALT: 40 U/L (ref 0–53)
AST: 60 U/L — ABNORMAL HIGH (ref 0–37)
CO2: 38 mEq/L — ABNORMAL HIGH (ref 19–32)
Calcium: 8.7 mg/dL (ref 8.4–10.5)
Chloride: 69 mEq/L — ABNORMAL LOW (ref 96–112)
GFR calc non Af Amer: >90 mL/min (ref >90)
Sodium: 117 mEq/L — CL (ref 135–145)

## 2012-10-26 LAB — IRON AND TIBC
Saturation Ratios: 6 % — ABNORMAL LOW (ref 20–55)
UIBC: 166 ug/dL (ref 125–400)

## 2012-10-26 LAB — CBC WITH DIFFERENTIAL/PLATELET
Eosinophils Relative: 0 % (ref 0–5)
Lymphocytes Relative: 5 % — ABNORMAL LOW (ref 12–46)
Monocytes Absolute: 1.2 10*3/uL — ABNORMAL HIGH (ref 0.1–1.0)
Monocytes Relative: 6 % (ref 3–12)
Neutrophils Relative %: 89 % — ABNORMAL HIGH (ref 43–77)
Platelets: 335 10*3/uL (ref 150–400)
RBC: 3.52 MIL/uL — ABNORMAL LOW (ref 4.22–5.81)
WBC: 20.3 10*3/uL — ABNORMAL HIGH (ref 4.0–10.5)

## 2012-10-26 LAB — MAGNESIUM: Magnesium: 2.3 mg/dL (ref 1.5–2.5)

## 2012-10-26 LAB — RETICULOCYTES
RBC.: 3.49 MIL/uL — ABNORMAL LOW (ref 4.22–5.81)
Retic Ct Pct: 1 % (ref 0.4–3.1)

## 2012-10-26 LAB — VITAMIN B12: Vitamin B-12: 913 pg/mL — ABNORMAL HIGH (ref 211–911)

## 2012-10-26 LAB — FERRITIN: Ferritin: 1546 ng/mL — ABNORMAL HIGH (ref 22–322)

## 2012-10-26 MED ORDER — ALBUTEROL SULFATE (5 MG/ML) 0.5% IN NEBU
5.0000 mg | INHALATION_SOLUTION | Freq: Once | RESPIRATORY_TRACT | Status: AC
  Administered 2012-10-26: 5 mg via RESPIRATORY_TRACT
  Filled 2012-10-26: qty 1

## 2012-10-26 MED ORDER — IPRATROPIUM-ALBUTEROL 0.5-2.5 (3) MG/3ML IN SOLN
3.0000 mL | RESPIRATORY_TRACT | Status: DC
Start: 2012-10-26 — End: 2012-10-26

## 2012-10-26 MED ORDER — OXYCODONE-ACETAMINOPHEN 5-325 MG PO TABS
1.0000 | ORAL_TABLET | Freq: Once | ORAL | Status: AC
  Administered 2012-10-26: 1 via ORAL
  Filled 2012-10-26: qty 1

## 2012-10-26 MED ORDER — LEVETIRACETAM 500 MG PO TABS
500.0000 mg | ORAL_TABLET | Freq: Two times a day (BID) | ORAL | Status: DC
  Administered 2012-10-26 – 2012-10-29 (×6): 500 mg via ORAL
  Filled 2012-10-26 (×10): qty 1

## 2012-10-26 MED ORDER — ENOXAPARIN SODIUM 40 MG/0.4ML ~~LOC~~ SOLN
40.0000 mg | SUBCUTANEOUS | Status: DC
  Administered 2012-10-26 – 2012-10-28 (×3): 40 mg via SUBCUTANEOUS
  Filled 2012-10-26 (×4): qty 0.4

## 2012-10-26 MED ORDER — SODIUM CHLORIDE 0.9 % IV SOLN
INTRAVENOUS | Status: DC
  Administered 2012-10-26 – 2012-10-29 (×6): via INTRAVENOUS

## 2012-10-26 MED ORDER — PIPERACILLIN-TAZOBACTAM 3.375 G IVPB
INTRAVENOUS | Status: AC
  Filled 2012-10-26: qty 50

## 2012-10-26 MED ORDER — POTASSIUM CHLORIDE CRYS ER 20 MEQ PO TBCR
40.0000 meq | EXTENDED_RELEASE_TABLET | Freq: Once | ORAL | Status: AC
  Administered 2012-10-26: 40 meq via ORAL
  Filled 2012-10-26: qty 2

## 2012-10-26 MED ORDER — VANCOMYCIN HCL 500 MG IV SOLR
500.0000 mg | Freq: Three times a day (TID) | INTRAVENOUS | Status: DC
  Administered 2012-10-27: 500 mg via INTRAVENOUS
  Filled 2012-10-26 (×5): qty 500

## 2012-10-26 MED ORDER — CARBAMAZEPINE 200 MG PO TABS
200.0000 mg | ORAL_TABLET | Freq: Three times a day (TID) | ORAL | Status: DC
  Administered 2012-10-26 – 2012-10-29 (×9): 200 mg via ORAL
  Filled 2012-10-26 (×10): qty 1

## 2012-10-26 MED ORDER — SODIUM CHLORIDE 0.9 % IV BOLUS (SEPSIS): 1000.0000 mL | Freq: Once | INTRAVENOUS | Status: DC

## 2012-10-26 MED ORDER — OXYCODONE HCL 5 MG PO TABS
5.0000 mg | ORAL_TABLET | Freq: Four times a day (QID) | ORAL | Status: DC | PRN
  Administered 2012-10-26 – 2012-10-27 (×3): 5 mg via ORAL
  Filled 2012-10-26 (×3): qty 1

## 2012-10-26 MED ORDER — IPRATROPIUM BROMIDE 0.02 % IN SOLN
0.5000 mg | RESPIRATORY_TRACT | Status: DC
  Administered 2012-10-26 – 2012-10-27 (×7): 0.5 mg via RESPIRATORY_TRACT
  Filled 2012-10-26 (×8): qty 2.5

## 2012-10-26 MED ORDER — POTASSIUM CHLORIDE 10 MEQ/100ML IV SOLN
10.0000 meq | INTRAVENOUS | Status: AC
Start: 2012-10-26 — End: 2012-10-27
  Administered 2012-10-26 (×4): 10 meq via INTRAVENOUS
  Filled 2012-10-26: qty 400

## 2012-10-26 MED ORDER — POTASSIUM CHLORIDE 10 MEQ/100ML IV SOLN: 10.0000 meq | INTRAVENOUS | Status: DC

## 2012-10-26 MED ORDER — PIPERACILLIN-TAZOBACTAM 3.375 G IVPB
3.3750 g | Freq: Once | INTRAVENOUS | Status: AC
  Administered 2012-10-26: 3.375 g via INTRAVENOUS
  Filled 2012-10-26: qty 50

## 2012-10-26 MED ORDER — PANTOPRAZOLE SODIUM 40 MG PO TBEC
40.0000 mg | DELAYED_RELEASE_TABLET | Freq: Every day | ORAL | Status: DC
  Administered 2012-10-27 – 2012-10-28 (×2): 40 mg via ORAL
  Filled 2012-10-26 (×2): qty 1

## 2012-10-26 MED ORDER — PIPERACILLIN-TAZOBACTAM 3.375 G IVPB
3.3750 g | Freq: Three times a day (TID) | INTRAVENOUS | Status: DC
  Administered 2012-10-26 – 2012-10-29 (×8): 3.375 g via INTRAVENOUS
  Filled 2012-10-26 (×13): qty 50

## 2012-10-26 MED ORDER — SODIUM CHLORIDE 0.9 % IV SOLN
Freq: Once | INTRAVENOUS | Status: AC
  Administered 2012-10-26: 15:00:00 via INTRAVENOUS

## 2012-10-26 MED ORDER — IOHEXOL 350 MG/ML SOLN
100.0000 mL | Freq: Once | INTRAVENOUS | Status: AC | PRN
  Administered 2012-10-26: 100 mL via INTRAVENOUS

## 2012-10-26 MED ORDER — POTASSIUM CHLORIDE CRYS ER 20 MEQ PO TBCR
20.0000 meq | EXTENDED_RELEASE_TABLET | Freq: Every day | ORAL | Status: AC
  Administered 2012-10-26 – 2012-10-27 (×2): 20 meq via ORAL
  Filled 2012-10-26 (×2): qty 1

## 2012-10-26 MED ORDER — VANCOMYCIN HCL 500 MG IV SOLR
INTRAVENOUS | Status: AC
  Filled 2012-10-26: qty 500

## 2012-10-26 MED ORDER — IPRATROPIUM BROMIDE 0.02 % IN SOLN
0.5000 mg | Freq: Once | RESPIRATORY_TRACT | Status: AC
  Administered 2012-10-26: 0.5 mg via RESPIRATORY_TRACT
  Filled 2012-10-26 (×2): qty 2.5

## 2012-10-26 MED ORDER — METHYLPREDNISOLONE SODIUM SUCC 125 MG IJ SOLR
125.0000 mg | Freq: Three times a day (TID) | INTRAMUSCULAR | Status: DC
  Administered 2012-10-26 – 2012-10-28 (×6): 125 mg via INTRAVENOUS
  Filled 2012-10-26 (×6): qty 2

## 2012-10-26 MED ORDER — VANCOMYCIN HCL IN DEXTROSE 1-5 GM/200ML-% IV SOLN
1000.0000 mg | Freq: Once | INTRAVENOUS | Status: AC
  Administered 2012-10-26: 1000 mg via INTRAVENOUS
  Filled 2012-10-26: qty 200

## 2012-10-26 MED ORDER — ALBUTEROL SULFATE (5 MG/ML) 0.5% IN NEBU
2.5000 mg | INHALATION_SOLUTION | RESPIRATORY_TRACT | Status: DC
  Administered 2012-10-26 – 2012-10-27 (×7): 2.5 mg via RESPIRATORY_TRACT
  Filled 2012-10-26 (×8): qty 0.5

## 2012-10-26 MED ORDER — THIAMINE HCL 100 MG/ML IJ SOLN
100.0000 mg | Freq: Every day | INTRAMUSCULAR | Status: AC
  Administered 2012-10-26 – 2012-10-28 (×3): 100 mg via INTRAVENOUS
  Filled 2012-10-26 (×3): qty 2

## 2012-10-26 NOTE — ED Notes (Signed)
Dr. Janna Arch at bedside,

## 2012-10-26 NOTE — Progress Notes (Signed)
ANTICOAGULATION CONSULT NOTE - Initial Consult  Pharmacy Consult for Lovenox Indication: VTE prophylaxis  No Known Allergies  Patient Measurements: Height: 5\' 9"  (175.3 cm) Weight: 120 lb (54.432 kg) IBW/kg (Calculated) : 70.7   Vital Signs: Temp: 94.1 F (34.5 C) (12/29 1344) Temp src: Oral (12/29 1344) BP: 103/60 mmHg (12/29 1344) Pulse Rate: 82  (12/29 1344)  Labs:  Basename 10/26/12 1312  HGB 13.0  HCT 36.1*  PLT 335  APTT --  LABPROT --  INR --  HEPARINUNFRC --  CREATININE 0.56  CKTOTAL --  CKMB --  TROPONINI --    Estimated Creatinine Clearance: 97.3 ml/min (by C-G formula based on Cr of 0.56).   Medical History: Past Medical History  Diagnosis Date  . Pancreatitis   . Seizures   . Back pain   . ETOH abuse   . COPD (chronic obstructive pulmonary disease)   . SAH (subarachnoid hemorrhage)   . Macrocytosis 08/17/2011  . Bradycardia 08/17/2011  . Pneumonia     Medications:  Scheduled:    . ipratropium  0.5 mg Nebulization Q4H   And  . albuterol  2.5 mg Nebulization Q4H  . [COMPLETED] albuterol  5 mg Nebulization Once  . carbamazepine  200 mg Oral TID  . enoxaparin (LOVENOX) injection  40 mg Subcutaneous Q24H  . [COMPLETED] ipratropium  0.5 mg Nebulization Once  . levETIRAcetam  500 mg Oral BID  . methylPREDNISolone sodium succinate  125 mg Intravenous Q8H  . [COMPLETED] oxyCODONE-acetaminophen  1 tablet Oral Once  . pantoprazole  40 mg Oral Q1200  . potassium chloride  20 mEq Oral Daily  . [COMPLETED] potassium chloride SA  40 mEq Oral Once  . thiamine  100 mg Intravenous Daily  . [DISCONTINUED] ipratropium-albuterol  3 mL Nebulization Q4H    Assessment: CrCl > 30 ml/min  Goal of Therapy:  DVT prophylaxis dosing Monitor platelets by anticoagulation protocol: Yes   Plan:  Lovenox 40 mg SQ every 24 hours Monitor platelets, CBC Monitor renal function  Raquel James, Tabithia Stroder Bennett 10/26/2012,4:38 PM

## 2012-10-26 NOTE — ED Notes (Signed)
Pt reports that he was dx w/ pneumonia on 12/22, has gotten much worse since then and can hardly breath. Dizzy at times. ?fever and now vomiting.

## 2012-10-26 NOTE — Progress Notes (Signed)
ANTIBIOTIC CONSULT NOTE - INITIAL  Pharmacy Consult for Zosyn and Vancomycin Indication: pneumonia  No Known Allergies  Patient Measurements: Height: 5\' 9"  (175.3 cm) Weight: 120 lb (54.432 kg) IBW/kg (Calculated) : 70.7    Vital Signs: Temp: 94.1 F (34.5 C) (12/29 1344) Temp src: Oral (12/29 1344) BP: 103/60 mmHg (12/29 1344) Pulse Rate: 82  (12/29 1344) Intake/Output from previous day:   Intake/Output from this shift: Total I/O In: 1000 [I.V.:1000] Out: -   Labs:  Basename 10/26/12 1312  WBC 20.3*  HGB 13.0  PLT 335  LABCREA --  CREATININE 0.56   Estimated Creatinine Clearance: 97.3 ml/min (by C-G formula based on Cr of 0.56). No results found for this basename: VANCOTROUGH:2,VANCOPEAK:2,VANCORANDOM:2,GENTTROUGH:2,GENTPEAK:2,GENTRANDOM:2,TOBRATROUGH:2,TOBRAPEAK:2,TOBRARND:2,AMIKACINPEAK:2,AMIKACINTROU:2,AMIKACIN:2, in the last 72 hours   Microbiology: Recent Results (from the past 720 hour(s))  CULTURE, BLOOD (ROUTINE X 2)     Status: Normal (Preliminary result)   Collection Time   10/26/12  3:17 PM      Component Value Range Status Comment   Specimen Description RIGHT ANTECUBITAL   Final    Special Requests BOTTLES DRAWN AEROBIC AND ANAEROBIC 6CC   Final    Culture PENDING   Incomplete    Report Status PENDING   Incomplete   CULTURE, BLOOD (ROUTINE X 2)     Status: Normal (Preliminary result)   Collection Time   10/26/12  3:18 PM      Component Value Range Status Comment   Specimen Description LEFT ANTECUBITAL   Final    Special Requests BOTTLES DRAWN AEROBIC ONLY 6CC   Final    Culture PENDING   Incomplete    Report Status PENDING   Incomplete     Medical History: Past Medical History  Diagnosis Date  . Pancreatitis   . Seizures   . Back pain   . ETOH abuse   . COPD (chronic obstructive pulmonary disease)   . SAH (subarachnoid hemorrhage)   . Macrocytosis 08/17/2011  . Bradycardia 08/17/2011  . Pneumonia     Medications:  Scheduled:    .  ipratropium  0.5 mg Nebulization Q4H   And  . albuterol  2.5 mg Nebulization Q4H  . [COMPLETED] albuterol  5 mg Nebulization Once  . carbamazepine  200 mg Oral TID  . enoxaparin (LOVENOX) injection  40 mg Subcutaneous Q24H  . [COMPLETED] ipratropium  0.5 mg Nebulization Once  . levETIRAcetam  500 mg Oral BID  . methylPREDNISolone sodium succinate  125 mg Intravenous Q8H  . [COMPLETED] oxyCODONE-acetaminophen  1 tablet Oral Once  . pantoprazole  40 mg Oral Q1200  . potassium chloride  20 mEq Oral Daily  . [COMPLETED] potassium chloride SA  40 mEq Oral Once  . thiamine  100 mg Intravenous Daily  . [DISCONTINUED] ipratropium-albuterol  3 mL Nebulization Q4H   Assessment: Excellent renal function Patient weight 54.4 kg Vancomycin 1 GM and Zosyn 3.375 GM given in ER  Goal of Therapy:  Vancomycin trough level 15-20 mcg/ml  Plan:  Zosyn 3.375 GM IV every 8 hours, next dose midnight  Vancomycin 500 mg IV every 8 hours, after Vancomycin 1 GM loading dose in ER Vancomycin trough at steady state Labs per protocol  [name], Tynell Winchell Bennett 10/26/2012,4:36 PM

## 2012-10-26 NOTE — ED Notes (Signed)
Pt returned from ct scan, transported to floor,

## 2012-10-26 NOTE — ED Notes (Signed)
Pt c/o cough, states that he was diagnosed with pneumonia October 19, 2012, has not gotten any better, Dr. Estell Harpin at bedside prior to RN, see EDP assessment for further,

## 2012-10-26 NOTE — ED Provider Notes (Signed)
History  This chart was scribed for Jose Lennert, MD by [name], ED scribe. This patient was seen in room APA01/[facility] and the patient's care was started at 1343.   CSN: [id number]  Arrival date & time 10/26/12  1343   First MD Initiated Contact with Patient 10/26/12 1406      Chief Complaint  Patient presents with  . Pneumonia   Patient is a 36 y.o. male presenting with shortness of breath. The history is provided by the patient. No language interpreter was used.  Shortness of Breath  The current episode started 5 to 7 days ago. The problem occurs continuously. The problem has been gradually worsening. The problem is mild. Nothing relieves the symptoms. The symptoms are aggravated by activity. Associated symptoms include shortness of breath. Pertinent negatives include no fever. The cough has no precipitants. The cough is productive. Jose becomes [name] when coughing. Nothing relieves the cough. Nothing worsens the cough. There was no intake of a foreign body. The Heimlich maneuver was not attempted. He has had no prior steroid use.   [name] is a 36 y.o. male who presents to the Emergency Department complaining of constant gradually worsening SOB associated w/recent dx of pneumonia on 12/22 and states has gotten worse since then and can hardly breathe. He states is dizzy at times and sometimes has right sided rib pain.   Past Medical History  Diagnosis Date  . Pancreatitis   . Seizures   . Back pain   . ETOH abuse   . COPD (chronic obstructive pulmonary disease)   . SAH (subarachnoid hemorrhage)   . Macrocytosis 08/17/2011  . Bradycardia 08/17/2011  . Pneumonia     Past Surgical History  Procedure Date  . Back surgery   . Septoplasty     Family History  Problem Relation Age of Onset  . Seizures Father   . Alcohol abuse Father   . Coronary artery disease Mother     deceased age 43    History  Substance Use Topics  . Smoking status: Current Every Day  Smoker -- 1.0 packs/day for 20 years    Types: Cigarettes  . Smokeless tobacco: Never Used  . Alcohol Use: Yes     Comment: occasionally      Review of Systems  Constitutional: Negative for fever and chills.  Respiratory: Positive for shortness of breath.   Gastrointestinal: Negative for nausea and vomiting.  Neurological: Positive for dizziness. Negative for weakness.  All other systems reviewed and are negative.    Allergies  Review of patient's allergies indicates no known allergies.  Home Medications   Current Outpatient Rx  Name  Route  Sig  Dispense  Refill  . ALBUTEROL SULFATE HFA 108 (90 BASE) MCG/ACT IN AERS   Inhalation   Inhale 1-2 puffs into the lungs every 6 (six) hours as needed for wheezing.   1 Inhaler   0   . CARBAMAZEPINE 200 MG PO TABS   Oral   Take 200-400 mg by mouth 3 (three) times daily. Take 2 tablets in the morning, 1 tablet at lunch and 2 tablets at bedtime.         Marland Kitchen LEVETIRACETAM 500 MG PO TABS   Oral   Take 500 mg by mouth 2 (two) times daily.          Marland Kitchen LEVOFLOXACIN 500 MG PO TABS   Oral   Take 1 tablet (500 mg total) by mouth daily.   7 tablet  0   . LORAZEPAM 0.5 MG PO TABS   Oral   Take 1 tablet (0.5 mg total) by mouth every 8 (eight) hours.   21 tablet   0   . ONDANSETRON HCL 4 MG PO TABS   Oral   Take 1 tablet (4 mg total) by mouth every 6 (six) hours.   12 tablet   0   . OXYCODONE-ACETAMINOPHEN 5-500 MG PO CAPS   Oral   Take 1 capsule by mouth every 6 (six) hours as needed. For pain   30 capsule   0     BP 103/60  Pulse 82  Temp 94.1 F (34.5 C) (Oral)  Resp 22  Ht 5\' 9"  (1.753 m)  Wt 120 lb (54.432 kg)  BMI 17.72 kg/m2  SpO2 96%  Physical Exam  Nursing note and vitals reviewed. Constitutional: He is oriented to person, place, and time. He appears well-developed and well-nourished. No distress.  HENT:  Head: Normocephalic and atraumatic.  Eyes: EOM are normal.  Neck: Neck supple. No tracheal  deviation present.  Cardiovascular: Normal rate, regular rhythm and normal heart sounds.   No murmur heard. Pulmonary/Chest: Effort normal. No respiratory distress.       Crackles bilaterally  Musculoskeletal: Normal range of motion.  Neurological: He is alert and oriented to person, place, and time.  Skin: Skin is warm and dry.  Psychiatric: He has a normal mood and affect. His behavior is normal.    ED Course  Procedures (including critical care time) DIAGNOSTIC STUDIES: Oxygen Saturation is 96% on room air, adequate by my interpretation.    COORDINATION OF CARE: At 215 PM Discussed treatment plan with patient which includes breathing treatment, CXR, blood work. Patient agrees.    Labs Reviewed  CBC WITH DIFFERENTIAL  COMPREHENSIVE METABOLIC PANEL   No results found.   No diagnosis found.  CRITICAL CARE Performed by: [name]   Total critical care time: 40  Critical care time was exclusive of separately billable procedures and treating other patients.  Critical care was necessary to treat or prevent imminent or life-threatening deterioration.  Critical care was time spent personally by me on the following activities: development of treatment plan with patient and/or surrogate as well as nursing, discussions with consultants, evaluation of patient's response to treatment, examination of patient, obtaining history from patient or surrogate, ordering and performing treatments and interventions, ordering and review of laboratory studies, ordering and review of radiographic studies, pulse oximetry and re-evaluation of patient's condition.   MDM  The chart was scribed for me under my direct supervision.  I personally performed the history, physical, and medical decision making and all procedures in the evaluation of this patient.Jose Lennert, MD 10/26/12 [phone]

## 2012-10-26 NOTE — H&P (Signed)
[id number] 

## 2012-10-26 NOTE — ED Notes (Signed)
CRITICAL VALUE ALERT  Critical value received:  Na 117, K 2.3  Date of notification:  10/26/2012  Time of notification: 1457  Critical value read back:yes  Nurse who received alert:  L. Elmer Picker RN  MD notified (1st page):  Dr. Estell Harpin  Time of first page: 1457  MD notified (2nd page):  Time of second page:  Responding MD: Dr. Estell Harpin  Time MD responded: [phone]

## 2012-10-27 LAB — BASIC METABOLIC PANEL
BUN: 5 mg/dL — ABNORMAL LOW (ref 6–23)
Chloride: 85 mEq/L — ABNORMAL LOW (ref 96–112)
GFR calc Af Amer: >90 mL/min (ref >90)
Potassium: 2.5 mEq/L — CL (ref 3.5–5.1)
Sodium: 127 mEq/L — ABNORMAL LOW (ref 135–145)

## 2012-10-27 LAB — DIFFERENTIAL
Basophils Absolute: 0 10*3/uL (ref 0.0–0.1)
Basophils Relative: 0 % (ref 0–1)
Lymphocytes Relative: 10 % — ABNORMAL LOW (ref 12–46)
Neutro Abs: 10.2 10*3/uL — ABNORMAL HIGH (ref 1.7–7.7)

## 2012-10-27 LAB — HEPATIC FUNCTION PANEL
ALT: 27 U/L (ref 0–53)
AST: 28 U/L (ref 0–37)
Albumin: 2 g/dL — ABNORMAL LOW (ref 3.5–5.2)
Bilirubin, Direct: 0.2 mg/dL (ref 0.0–0.3)

## 2012-10-27 LAB — CBC
HCT: 31.6 % — ABNORMAL LOW (ref 39.0–52.0)
Hemoglobin: 11.4 g/dL — ABNORMAL LOW (ref 13.0–17.0)
RDW: 13.1 % (ref 11.5–15.5)
WBC: 12.1 10*3/uL — ABNORMAL HIGH (ref 4.0–10.5)

## 2012-10-27 MED ORDER — PIPERACILLIN-TAZOBACTAM 3.375 G IVPB
INTRAVENOUS | Status: AC
  Filled 2012-10-27: qty 100

## 2012-10-27 MED ORDER — LEVOFLOXACIN IN D5W 500 MG/100ML IV SOLN
INTRAVENOUS | Status: AC
Start: 2012-10-27 — End: 2012-10-27
  Filled 2012-10-27: qty 100

## 2012-10-27 MED ORDER — VANCOMYCIN HCL 500 MG IV SOLR
500.0000 mg | Freq: Three times a day (TID) | INTRAVENOUS | Status: DC
  Administered 2012-10-27 – 2012-10-28 (×3): 500 mg via INTRAVENOUS
  Filled 2012-10-27 (×10): qty 500

## 2012-10-27 MED ORDER — GUAIFENESIN ER 600 MG PO TB12
1200.0000 mg | ORAL_TABLET | Freq: Two times a day (BID) | ORAL | Status: DC
  Administered 2012-10-27 – 2012-10-29 (×4): 1200 mg via ORAL
  Filled 2012-10-27 (×4): qty 2

## 2012-10-27 MED ORDER — CIPROFLOXACIN IN D5W 400 MG/200ML IV SOLN
INTRAVENOUS | Status: AC
  Filled 2012-10-27: qty 200

## 2012-10-27 MED ORDER — VANCOMYCIN HCL 500 MG IV SOLR
INTRAVENOUS | Status: AC
  Filled 2012-10-27: qty 1000

## 2012-10-27 MED ORDER — OXYCODONE HCL 5 MG PO TABS
5.0000 mg | ORAL_TABLET | ORAL | Status: DC | PRN
  Administered 2012-10-27 – 2012-10-29 (×11): 5 mg via ORAL
  Filled 2012-10-27 (×11): qty 1

## 2012-10-27 MED ORDER — BOOST / RESOURCE BREEZE PO LIQD
1.0000 | Freq: Three times a day (TID) | ORAL | Status: DC
  Administered 2012-10-28 – 2012-10-29 (×2): 1 via ORAL

## 2012-10-27 MED ORDER — LEVOFLOXACIN IN D5W 500 MG/100ML IV SOLN
500.0000 mg | INTRAVENOUS | Status: DC
  Administered 2012-10-27: 500 mg via INTRAVENOUS
  Filled 2012-10-27: qty 100

## 2012-10-27 MED ORDER — OXYCODONE HCL 5 MG PO TABS
5.0000 mg | ORAL_TABLET | Freq: Once | ORAL | Status: AC
  Administered 2012-10-27: 5 mg via ORAL
  Filled 2012-10-27: qty 1

## 2012-10-27 MED ORDER — POTASSIUM CHLORIDE 10 MEQ/100ML IV SOLN
10.0000 meq | INTRAVENOUS | Status: AC
  Administered 2012-10-27 (×5): 10 meq via INTRAVENOUS
  Filled 2012-10-27 (×5): qty 100

## 2012-10-27 NOTE — Consult Note (Signed)
Consult requested by: Dr. Delbert Harness Consult requested for healthcare associated pneumonia:  HPI: This is a 36 year old who has a history as noted below. He became increasingly short of breath and came to the emergency room where he was noted to have a left upper lobe infiltrate. CT has shown that he has what looks like a cavitary infiltrate and he has some debris in the mainstem bronchus. He has a significant history of alcohol abuse and of seizures but he is not aware of any episodes of being unconscious that would predispose him to aspiration. He says he is smoking less. His sodium level was very low on admission his potassium level is still low. He had been in the hospital with pancreatitis recently so he is being treated his healthcare associated pneumonia  Past Medical History  Diagnosis Date  . Pancreatitis   . Seizures   . Back pain   . ETOH abuse   . COPD (chronic obstructive pulmonary disease)   . SAH (subarachnoid hemorrhage)   . Macrocytosis 08/17/2011  . Bradycardia 08/17/2011  . Pneumonia      Family History  Problem Relation Age of Onset  . Seizures Father   . Alcohol abuse Father   . Coronary artery disease Mother     deceased age 71     History   Social History  . Marital Status: Single    Spouse Name: N/A    Number of Children: N/A  . Years of Education: N/A   Social History Main Topics  . Smoking status: Current Every Day Smoker -- 1.0 packs/day for 20 years    Types: Cigarettes  . Smokeless tobacco: Never Used  . Alcohol Use: Yes     Comment: occasionally  . Drug Use: No  . Sexually Active: Not Currently   Other Topics Concern  . None   Social History Narrative  . None     ROS: He says he feels better. He is coughing up sputum. He has not had any fever or chills. He has not had any hemoptysis    Objective: Vital signs in last 24 hours: Temp:  [97.2 F (36.2 C)-97.6 F (36.4 C)] 97.2 F (36.2 C) (12/30 1426) Pulse Rate:  [57-87] 57   (12/30 1426) Resp:  [20] 20  (12/30 1426) BP: (110-131)/(60-97) 131/97 mmHg (12/30 1426) SpO2:  [91 %-96 %] 92 % (12/30 1545) FiO2 (%):  [21 %] 21 % (12/30 1545) Weight change:  Last BM Date: 10/27/12  Intake/Output from previous day: 12/29 0701 - 12/30 0700 In: 3175 [I.V.:3025; IV Piggyback:150] Out: 4 [Urine:4]  PHYSICAL EXAM He is a thin male in no acute distress. His pupils are reactive. His nose and throat are clear. His neck is supple without masses bruits or JVD. His chest shows some rhonchi bilaterally more on the left than on the right and he does have some fine rales in the left upper lobe area. His heart is regular without gallop. His abdomen is soft without masses. Extremities showed no edema. Central nervous system exam is grossly intact  Lab Results: Basic Metabolic Panel:  Basename 10/27/12 0519 10/26/12 1604 10/26/12 1312  NA 127* -- 117*  K 2.5* -- 2.3*  CL 85* -- 69*  CO2 31 -- 38*  GLUCOSE 154* -- 89  BUN 5* -- 10  CREATININE 0.37* -- 0.56  CALCIUM 8.0* -- 8.7  MG -- 2.3 --  PHOS -- -- --   Liver Function Tests:  [facility] 10/27/12 0519 10/26/12 1312  AST 28 60*  ALT 27 40  ALKPHOS 166* 171*  BILITOT 0.3 0.6  PROT 5.9* 7.3  ALBUMIN 2.0* 2.4*   No results found for this basename: LIPASE:2,AMYLASE:2 in the last 72 hours No results found for this basename: AMMONIA:2 in the last 72 hours CBC:  Basename 10/27/12 0519 10/26/12 1312  WBC 12.1* 20.3*  NEUTROABS 10.2* 18.1*  HGB 11.4* 13.0  HCT 31.6* 36.1*  MCV 102.9* 102.6*  PLT 307 335   Cardiac Enzymes: No results found for this basename: CKTOTAL:3,CKMB:3,CKMBINDEX:3,TROPONINI:3 in the last 72 hours BNP: No results found for this basename: PROBNP:3 in the last 72 hours D-Dimer:  [facility] 10/26/12 1604  DDIMER 1.10*   CBG: No results found for this basename: GLUCAP:6 in the last 72 hours Hemoglobin A1C: No results found for this basename: HGBA1C in the last 72 hours Fasting Lipid Panel: No  results found for this basename: CHOL,HDL,LDLCALC,TRIG,CHOLHDL,LDLDIRECT in the last 72 hours Thyroid Function Tests:  Basename 10/27/12 0519  TSH 0.346*  T4TOTAL --  FREET4 --  T3FREE --  THYROIDAB --   Anemia Panel:  Basename 10/26/12 1604  VITAMINB12 913*  FOLATE 2.2*  FERRITIN 1546*  TIBC 177*  IRON 11*  RETICCTPCT 1.0   Coagulation: No results found for this basename: LABPROT:2,INR:2 in the last 72 hours Urine Drug Screen: Drugs of Abuse     Component Value Date/Time   LABOPIA NONE DETECTED 07/11/2012 0336   LABOPIA NEGATIVE 08/14/2011 1711   COCAINSCRNUR NONE DETECTED 07/11/2012 0336   COCAINSCRNUR NEGATIVE 08/14/2011 1711   LABBENZ NONE DETECTED 07/11/2012 0336   LABBENZ NEGATIVE 08/14/2011 1711   AMPHETMU NONE DETECTED 07/11/2012 0336   AMPHETMU NEGATIVE 08/14/2011 1711   THCU POSITIVE* 07/11/2012 0336   LABBARB NONE DETECTED 07/11/2012 0336    Alcohol Level: No results found for this basename: ETH:2 in the last 72 hours Urinalysis: No results found for this basename: COLORURINE:2,APPERANCEUR:2,LABSPEC:2,PHURINE:2,GLUCOSEU:2,HGBUR:2,BILIRUBINUR:2,KETONESUR:2,PROTEINUR:2,UROBILINOGEN:2,NITRITE:2,LEUKOCYTESUR:2 in the last 72 hours Misc. Labs:   ABGS: No results found for this basename: PHART,PCO2,PO2ART,TCO2,HCO3 in the last 72 hours   MICROBIOLOGY: Recent Results (from the past 240 hour(s))  CULTURE, BLOOD (ROUTINE X 2)     Status: Normal (Preliminary result)   Collection Time   10/26/12  3:17 PM      Component Value Range Status Comment   Specimen Description RIGHT ANTECUBITAL   Final    Special Requests BOTTLES DRAWN AEROBIC AND ANAEROBIC 6CC   Final    Culture NO GROWTH 1 DAY   Final    Report Status PENDING   Incomplete   CULTURE, BLOOD (ROUTINE X 2)     Status: Normal (Preliminary result)   Collection Time   10/26/12  3:18 PM      Component Value Range Status Comment   Specimen Description LEFT ANTECUBITAL   Final    Special Requests BOTTLES DRAWN  AEROBIC ONLY 6CC   Final    Culture NO GROWTH 1 DAY   Final    Report Status PENDING   Incomplete     Studies/Results: Dg Chest 2 View  10/26/2012  *RADIOLOGY REPORT*  Clinical Data: Pneumonia.  Cough.  CHEST - 2 VIEW  Comparison: 12/22 and 09/16/2012  Findings: There has been marked progression of the pneumonia in the left upper lobe now extending into the lingula.  There are now patchy infiltrates in the right upper lobe.  The consolidative infiltrate in the left lung apex is now cavitating.  No effusions.  No acute osseous abnormality.  There is an  old compression fracture of the mid thoracic spine.  IMPRESSION: Marked progression of left upper lobe pneumonia.  New right upper lobe pneumonia.  The patient has developed multiple areas of cavitation in the areas of pneumonia.   Original Report Authenticated By: Francene Boyers, M.D.    Ct Angio Chest W/cm &/or Wo Cm  10/26/2012  *RADIOLOGY REPORT*  Clinical Data: Pneumonia and elevated D-dimer.  Fever, vomiting and dizziness.  CT ANGIOGRAPHY CHEST  Technique:  Multidetector CT imaging of the chest using the standard protocol during bolus administration of intravenous contrast. Multiplanar reconstructed images including MIPs were obtained and reviewed to evaluate the vascular anatomy.  Contrast: OMNIPAQUE IOHEXOL 350 MG/ML SOLN  Comparison: Chest radiograph 10/26/2012 and CT chest 12/27/2010.  Findings: Mediastinal lymph nodes measure up to 10 mm in the low right paratracheal station.  Bihilar lymph nodes measure up to 1.2 cm on the right.  No axillary adenopathy.  Atherosclerotic calcification of the arterial vasculature, including coronary arteries.  Heart size normal.  Left ventricle appears dilated.  No pericardial effusion.  There is patchy bilateral air space consolidation with many areas of associated cavitation.  Findings are worst in the left upper lobe.  Patchy areas of peribronchovascular nodularity and septal thickening are seen as well.   Overall, the distribution appears peribronchovascular.  No pleural fluid.  Dependent debris is seen in the trachea and left mainstem bronchus.  Incidental imaging of the upper abdomen shows lymph nodes measuring up to 8 mm in the portacaval station.  No worrisome lytic or sclerotic lesions.  IMPRESSION:  1.  Multilobar airspace consolidation, predominantly cavitary in appearance.  Findings are worst in the left upper lobe.  Associated septal thickening and peribronchovascular nodularity bilaterally. Necrotizing pneumonia is favored.  Septic emboli cannot be excluded. 2.  Mediastinal and hilar adenopathy, likely reactive. 3.  Age advanced coronary artery calcification.   Original Report Authenticated By: Leanna Battles, M.D.     Medications:  Scheduled:   . ipratropium  0.5 mg Nebulization Q4H   And  . albuterol  2.5 mg Nebulization Q4H  . carbamazepine  200 mg Oral TID  . enoxaparin (LOVENOX) injection  40 mg Subcutaneous Q24H  . feeding supplement  1 Container Oral TID BM  . guaiFENesin  1,200 mg Oral BID  . levETIRAcetam  500 mg Oral BID  . levofloxacin (LEVAQUIN) IV  500 mg Intravenous Q24H  . methylPREDNISolone sodium succinate  125 mg Intravenous Q8H  . pantoprazole  40 mg Oral Q1200  . piperacillin-tazobactam (ZOSYN)  IV  3.375 g Intravenous Q8H  . sodium chloride  1,000 mL Intravenous Once  . thiamine  100 mg Intravenous Daily  . vancomycin  500 mg Intravenous Q8H   Continuous:   . sodium chloride 150 mL/hr at 10/27/12 1319   ZOX:[id number]  Assesment: He has healthcare associated pneumonia and some of this appears to be cavitary. I suspect this is related to aspiration. I added Levaquin because of the healthcare associated nature of his pneumonia. Zosyn should be good coverage for anaerobic bacteria. I don't think is really anything to add. Active Problems:  * No active hospital problems. *     Plan: Continue with triple antibiotic coverage. Thanks for allow me to see him  with you    LOS: 1 day   Jaielle Dlouhy L 10/27/2012, 6:54 PM

## 2012-10-27 NOTE — Progress Notes (Signed)
[id number] 

## 2012-10-27 NOTE — Progress Notes (Signed)
UR Chart Review Completed  

## 2012-10-27 NOTE — Care Management Note (Unsigned)
    Page 1 of 1   10/27/2012     2:21:30 PM   CARE MANAGEMENT NOTE 10/27/2012  Patient:  [name], [name]   Account Number:  [id number]  Date Initiated:  10/27/2012  Documentation initiated by:  Sharrie Rothman  Subjective/Objective Assessment:   Pt admitted from home with pneumonia. Pt is currently living with a friend and will return with friend at discharge. Pt is independent with ADL's.     Action/Plan:   No CM or HH needs noted.   Anticipated DC Date:  10/29/2012   Anticipated DC Plan:  HOME/SELF CARE      DC Planning Services  CM consult      Choice offered to / List presented to:             Status of service:  Completed, signed off Medicare Important Message given?   (If response is "NO", the following Medicare IM given date fields will be blank) Date Medicare IM given:   Date Additional Medicare IM given:    Discharge Disposition:    Per UR Regulation:    If discussed at Long Length of Stay Meetings, dates discussed:    Comments:  10/27/12 1415 Arlyss Queen, RN BSN CM

## 2012-10-27 NOTE — H&P (Signed)
NAMELEYTON, MAGOON NO.:  [id number]  MEDICAL RECORD NO.:  [id number]  LOCATION:  A327                          FACILITY:  [facility]  PHYSICIAN:  Melvyn Novas, MDDATE OF BIRTH:  10-23-1976  DATE OF ADMISSION:  10/26/2012 DATE OF DISCHARGE:  LH                             HISTORY & PHYSICAL   The patient is a 36 year old white male, discharged 2 weeks ago from the hospital, who is a known ethanolic presumed polysubstance abuse.  He was admitted for alcoholic hepatitis and pancreatitis several weeks ago.  He comes in with history of cough, fever, chills, and diminished sensorium. He was found to have a left upper lobe pneumonia which is new for him. With concomitant only with a sodium of 117 and hypokalemia with potassium of 2.3, etiology undetermined.  He does not take any diuretics.  He states he is imbibing 2 beers per day and "not smoking much."  He denies hemoptysis, rigors, or chills.  Interestingly, he is hypothermic with a temperature of 94.  Septicemia is considered, however he is admitted to be treated for his left upper lobe infiltrate and some consolidation in the right upper lobe also.  He will be placed on vanc and Zosyn with aggressive nebulizer therapy, pulmonary toilet, and incentive spirometry.  He denies chest pain.  He does not appear intoxicated at present and I am awaiting LFTs as of this dictation, they are not back yet.  PAST MEDICAL HISTORY:  Significant for subarachnoid hemorrhage from trauma, long-standing history of ethanol abuse, tobacco abuse, COPD, ethanolic hepatitis, questionable history of seizure disorder or withdrawal seizures, not certain.  He is theoretically on Keppra as well as Tegretol.  He is chronically noncompliant.  PAST SURGICAL HISTORY:  Remarkable for lumbar laminectomy as well as nasal septoplasty, presumably from trauma.  Current medicines are listed as Keppra 500 p.o. b.i.d., Tegretol 200 mg p.o.  t.i.d., oxycodone 5 mg p.o. t.i.d. p.r.n.  SOCIAL HISTORY:  He smokes a pack per day since adolescence and imbibes alcohol every day.  States he lives with a friend for the last 2 weeks.  PHYSICAL EXAMINATION:  VITAL SIGNS:  Temperature is 94.1, blood pressure 103/60, pulse 82 and regular, respiratory rate is 22, O2 sat is 96% on room air. HEENT:  Eyes, extraocular movement intact.  Sclerae clear.  Conjunctiva pink. LUNGS:  Show good bilateral coarse rhonchi.  Diminished breath sounds at the bases.  Bilateral inspiratory and expiratory wheeze of mild-to- moderate degree. HEART:  Regular rhythm.  No S3, S4 auscultated.  No heaves, thrills, or rubs. ABDOMEN:  Soft.  No right upper quadrant epigastric tenderness.  No guarding or rebound.  No masses.  No megaly. EXTREMITIES:  No clubbing, cyanosis, or edema. NEUROLOGIC:  The patient has no signs of asterixis.  He answers questions appropriately.  Cranial nerves II-XII grossly intact.  The patient moves all 4 extremities.  Impression as follows: 1. Left upper lobe infiltrate, new since 2 weeks ago in the face of     chronic obstructive pulmonary disease. 2. Hyponatremia, sodium of 117, etiology undetermined. 3. Hypokalemia, 2.3. 4. History of seizure disorder, withdrawal seizures not certain. 5. Ethanol abuse and presumed polysubstance  abuse.  PLAN:  Right now is to admit.  He is given thiamine 100 mg IV.  Seizure precautions, has been cultured, blood cultures drawn, D-dimer drawn. DuoNeb nebulizer.  We will add Solu-Medrol 125 IV q.8 h.  Consider CT of chest, if D-dimer is elevated.  CT angio and normal saline and potassium intravenously and monitor renal function.  Potassium and electrolytes as well as magnesium ordered.     Melvyn Novas, MD     RMD/MEDQ  D:  10/26/2012  T:  10/27/2012  Job:  [id number]

## 2012-10-27 NOTE — Progress Notes (Signed)
INITIAL NUTRITION ASSESSMENT  DOCUMENTATION CODES Per approved criteria  -Underweight -Severe malnutrition in the context of acute illness   INTERVENTION: Resource Breeze po TID, each supplement provides 250 kcal and 9 grams of protein.   NUTRITION DIAGNOSIS: Inadequate oral intake related to estimated nutrition needs exceed intake as evidenced by severe wt loss, polysubstance abuse and pneumonia  Goal: Pt to meet >/= 90% of their estimated nutrition needs; not met  Monitor:  Meal and oral nutrition supplement intake, labs and wt trends  Reason for Assessment: Malnutrition Screen  36 y.o. male  Admitting Dx: HCAP, Hyponatriemia   ASSESSMENT: Pt has hx of polysubstance abuse. Recent hospitalization related to pancreatitis.  Currently his appetite is very good and he is asking for additional food at each meal. Will provide additional low sodium options for him with meal tray and add oral supplement between meals. His wt hx reflects severe wt loss since previous admission 9%,11# (5 kg) in < 1 month, moderate depletion of body fat arms. He meets criteria for severe malnutrition given his >5% wt loss in 1 month and moderate depletion of body fat.  Height: Ht Readings from Last 1 Encounters:  10/26/12 5\' 9"  (1.753 m)    Weight: Wt Readings from Last 1 Encounters:  10/26/12 114 lb 6.7 oz (51.9 kg)    Ideal Body Weight: 160# (72.7 kg)  % Ideal Body Weight: 71%  Wt Readings from Last 10 Encounters:  10/26/12 114 lb 6.7 oz (51.9 kg)  10/19/12 125 lb (56.7 kg)  10/08/12 126 lb (57.153 kg)  09/23/12 120 lb (54.432 kg)  09/16/12 125 lb (56.7 kg)  08/13/12 120 lb (54.432 kg)  07/11/12 119 lb 15.9 oz (54.43 kg)  07/09/12 120 lb (54.432 kg)  07/09/12 120 lb (54.432 kg)  06/03/12 120 lb (54.432 kg)    Usual Body Weight: 120# (54.4 kg)  % Usual Body Weight: 96%  BMI:  Body mass index is 16.90 kg/(m^2). Underweight   Estimated Nutritional Needs: Kcal: 1600-1900 Protein:  78-93 gr Fluid: 1 ml/kcal  Skin: no issues noted  Diet Order: Sodium Restricted  EDUCATION NEEDS: -Education needs addressed   Intake/Output Summary (Last 24 hours) at 10/27/12 0959 Last data filed at 10/27/12 1610  Gross per 24 hour  Intake   3415 ml  Output      5 ml  Net   3410 ml    Last BM: 10/25/12  Labs:   Lab 10/27/12 0519 10/26/12 1604 10/26/12 1312  NA 127* -- 117*  K 2.5* -- 2.3*  CL 85* -- 69*  CO2 31 -- 38*  BUN 5* -- 10  CREATININE 0.37* -- 0.56  CALCIUM 8.0* -- 8.7  MG -- 2.3 --  PHOS -- -- --  GLUCOSE 154* -- 89    CBG (last 3)  No results found for this basename: GLUCAP:3 in the last 72 hours  Scheduled Meds:   . ipratropium  0.5 mg Nebulization Q4H   And  . albuterol  2.5 mg Nebulization Q4H  . carbamazepine  200 mg Oral TID  . enoxaparin (LOVENOX) injection  40 mg Subcutaneous Q24H  . levETIRAcetam  500 mg Oral BID  . methylPREDNISolone sodium succinate  125 mg Intravenous Q8H  . pantoprazole  40 mg Oral Q1200  . piperacillin-tazobactam (ZOSYN)  IV  3.375 g Intravenous Q8H  . potassium chloride  10 mEq Intravenous Q1 Hr x 5  . potassium chloride  20 mEq Oral Daily  . sodium chloride  1,000 mL  Intravenous Once  . thiamine  100 mg Intravenous Daily  . vancomycin  500 mg Intravenous Q8H    Continuous Infusions:   . sodium chloride 150 mL/hr at 10/27/12 0014    Past Medical History  Diagnosis Date  . Pancreatitis   . Seizures   . Back pain   . ETOH abuse   . COPD (chronic obstructive pulmonary disease)   . SAH (subarachnoid hemorrhage)   . Macrocytosis 08/17/2011  . Bradycardia 08/17/2011  . Pneumonia     Past Surgical History  Procedure Date  . Back surgery   . Septoplasty     [phone]

## 2012-10-28 LAB — CBC WITH DIFFERENTIAL/PLATELET
Basophils Relative: 0 % (ref 0–1)
Eosinophils Absolute: 0 10*3/uL (ref 0.0–0.7)
Eosinophils Relative: 0 % (ref 0–5)
Lymphs Abs: 1.5 10*3/uL (ref 0.7–4.0)
MCH: 37 pg — ABNORMAL HIGH (ref 26.0–34.0)
MCHC: 35.4 g/dL (ref 30.0–36.0)
MCV: 104.7 fL — ABNORMAL HIGH (ref 78.0–100.0)
Neutrophils Relative %: 78 % — ABNORMAL HIGH (ref 43–77)
Platelets: 302 10*3/uL (ref 150–400)
RDW: 13.3 % (ref 11.5–15.5)

## 2012-10-28 LAB — BASIC METABOLIC PANEL
BUN: 5 mg/dL — ABNORMAL LOW (ref 6–23)
CO2: 30 mEq/L (ref 19–32)
Chloride: 89 mEq/L — ABNORMAL LOW (ref 96–112)
GFR calc non Af Amer: >90 mL/min (ref >90)
Glucose, Bld: 144 mg/dL — ABNORMAL HIGH (ref 70–99)
Potassium: 3.2 mEq/L — ABNORMAL LOW (ref 3.5–5.1)
Sodium: 127 mEq/L — ABNORMAL LOW (ref 135–145)

## 2012-10-28 LAB — HEPATIC FUNCTION PANEL
ALT: 37 U/L (ref 0–53)
AST: 50 U/L — ABNORMAL HIGH (ref 0–37)
Albumin: 2 g/dL — ABNORMAL LOW (ref 3.5–5.2)
Alkaline Phosphatase: 122 U/L — ABNORMAL HIGH (ref 39–117)
Bilirubin, Direct: <0.1 mg/dL (ref 0.0–0.3)
Total Bilirubin: 0.3 mg/dL (ref 0.3–1.2)

## 2012-10-28 LAB — VANCOMYCIN, TROUGH: Vancomycin Tr: <5 ug/mL — ABNORMAL LOW (ref 10.0–20.0)

## 2012-10-28 MED ORDER — VANCOMYCIN HCL IN DEXTROSE 1-5 GM/200ML-% IV SOLN
1000.0000 mg | Freq: Two times a day (BID) | INTRAVENOUS | Status: DC
  Administered 2012-10-28 – 2012-10-29 (×2): 1000 mg via INTRAVENOUS
  Filled 2012-10-28 (×4): qty 200

## 2012-10-28 MED ORDER — POTASSIUM CHLORIDE 10 MEQ/100ML IV SOLN
10.0000 meq | INTRAVENOUS | Status: AC
  Administered 2012-10-28 (×4): 10 meq via INTRAVENOUS
  Filled 2012-10-28 (×4): qty 100

## 2012-10-28 MED ORDER — IPRATROPIUM BROMIDE 0.02 % IN SOLN
0.5000 mg | Freq: Four times a day (QID) | RESPIRATORY_TRACT | Status: DC
  Administered 2012-10-28 – 2012-10-29 (×6): 0.5 mg via RESPIRATORY_TRACT
  Filled 2012-10-28 (×5): qty 2.5

## 2012-10-28 MED ORDER — ALBUTEROL SULFATE (5 MG/ML) 0.5% IN NEBU
2.5000 mg | INHALATION_SOLUTION | Freq: Four times a day (QID) | RESPIRATORY_TRACT | Status: DC
  Administered 2012-10-28 – 2012-10-29 (×6): 2.5 mg via RESPIRATORY_TRACT
  Filled 2012-10-28 (×5): qty 0.5

## 2012-10-28 MED ORDER — FOLIC ACID 1 MG PO TABS
1.0000 mg | ORAL_TABLET | Freq: Every day | ORAL | Status: DC
  Administered 2012-10-28 – 2012-10-29 (×2): 1 mg via ORAL
  Filled 2012-10-28 (×2): qty 1

## 2012-10-28 MED ORDER — METHYLPREDNISOLONE SODIUM SUCC 125 MG IJ SOLR
80.0000 mg | Freq: Three times a day (TID) | INTRAMUSCULAR | Status: DC
  Administered 2012-10-28 – 2012-10-29 (×3): 80 mg via INTRAVENOUS
  Filled 2012-10-28 (×4): qty 2

## 2012-10-28 MED ORDER — LEVOFLOXACIN 750 MG PO TABS
750.0000 mg | ORAL_TABLET | ORAL | Status: DC
  Administered 2012-10-28: 750 mg via ORAL
  Filled 2012-10-28: qty 1

## 2012-10-28 NOTE — Progress Notes (Signed)
ANTIBIOTIC CONSULT NOTE   Pharmacy Consult for Zosyn, Vancomycin, and Levaquin Indication: pneumonia  No Known Allergies  Patient Measurements: Height: 5\' 9"  (175.3 cm) Weight: 114 lb 6.7 oz (51.9 kg) IBW/kg (Calculated) : 70.7   Vital Signs: Temp: 97.3 F (36.3 C) (12/31 0628) Temp src: Oral (12/31 0628) BP: 150/75 mmHg (12/31 0628) Pulse Rate: 68  (12/31 0628) Intake/Output from previous day: 12/30 0701 - 12/31 0700 In: 4832.5 [P.O.:720; I.V.:3562.5; IV Piggyback:550] Out: 4 [Urine:4] Intake/Output from this shift: Total I/O In: 240 [P.O.:240] Out: -   Labs:  Basename 10/28/12 0439 10/27/12 0519 10/26/12 1312  WBC 10.1 12.1* 20.3*  HGB 11.0* 11.4* 13.0  PLT 302 307 335  LABCREA -- -- --  CREATININE 0.31* 0.37* 0.56   Estimated Creatinine Clearance: 92.8 ml/min (by C-G formula based on Cr of 0.31). No results found for this basename: VANCOTROUGH:2,VANCOPEAK:2,VANCORANDOM:2,GENTTROUGH:2,GENTPEAK:2,GENTRANDOM:2,TOBRATROUGH:2,TOBRAPEAK:2,TOBRARND:2,AMIKACINPEAK:2,AMIKACINTROU:2,AMIKACIN:2, in the last 72 hours   Microbiology: Recent Results (from the past 720 hour(s))  CULTURE, BLOOD (ROUTINE X 2)     Status: Normal (Preliminary result)   Collection Time   10/26/12  3:17 PM      Component Value Range Status Comment   Specimen Description RIGHT ANTECUBITAL   Final    Special Requests BOTTLES DRAWN AEROBIC AND ANAEROBIC 6CC   Final    Culture NO GROWTH 1 DAY   Final    Report Status PENDING   Incomplete   CULTURE, BLOOD (ROUTINE X 2)     Status: Normal (Preliminary result)   Collection Time   10/26/12  3:18 PM      Component Value Range Status Comment   Specimen Description LEFT ANTECUBITAL   Final    Special Requests BOTTLES DRAWN AEROBIC ONLY 6CC   Final    Culture NO GROWTH 1 DAY   Final    Report Status PENDING   Incomplete    Medical History: Past Medical History  Diagnosis Date  . Pancreatitis   . Seizures   . Back pain   . ETOH abuse   . COPD  (chronic obstructive pulmonary disease)   . SAH (subarachnoid hemorrhage)   . Macrocytosis 08/17/2011  . Bradycardia 08/17/2011  . Pneumonia    Medications:  Scheduled:     . albuterol  2.5 mg Nebulization Q6H  . carbamazepine  200 mg Oral TID  . enoxaparin (LOVENOX) injection  40 mg Subcutaneous Q24H  . feeding supplement  1 Container Oral TID BM  . guaiFENesin  1,200 mg Oral BID  . ipratropium  0.5 mg Nebulization Q6H  . levETIRAcetam  500 mg Oral BID  . levofloxacin  750 mg Oral Q24H  . methylPREDNISolone sodium succinate  125 mg Intravenous Q8H  . [COMPLETED] oxyCODONE  5 mg Oral Once  . pantoprazole  40 mg Oral Q1200  . piperacillin-tazobactam (ZOSYN)  IV  3.375 g Intravenous Q8H  . [COMPLETED] potassium chloride  10 mEq Intravenous Q1 Hr x 5  . potassium chloride  10 mEq Intravenous Q1 Hr x 4  . [COMPLETED] potassium chloride  20 mEq Oral Daily  . sodium chloride  1,000 mL Intravenous Once  . [COMPLETED] thiamine  100 mg Intravenous Daily  . vancomycin  500 mg Intravenous Q8H  . [DISCONTINUED] albuterol  2.5 mg Nebulization Q4H  . [DISCONTINUED] ipratropium  0.5 mg Nebulization Q4H  . [DISCONTINUED] levofloxacin (LEVAQUIN) IV  500 mg Intravenous Q24H  . [DISCONTINUED] vancomycin  500 mg Intravenous Q8H   Assessment: 36yo male admitted for pna.  On Vancomycin,  Zosyn, and Levaquin.  Good renal fxn.  Estimated Creatinine Clearance: 92.8 ml/min (by C-G formula based on Cr of 0.31).  Goal of Therapy:  Vancomycin trough level 15-20 mcg/ml  Plan:  Zosyn 3.375 GM IV every 8 hours Vancomycin 500 mg IV every 8 hours Vancomycin trough today before next dose Levaquin changed to 750mg  PO q24hrs Duration of therapy per MD Monitor labs, renal fxn, and cultures per protocol  Valrie Hart A 10/28/2012,10:13 AM

## 2012-10-28 NOTE — Progress Notes (Signed)
Subjective: Is complaining of pain and says he's going to check out AMA because he has pain medicine at home they will take care of his problem.  Objective: Vital signs in last 24 hours: Temp:  [97.1 F (36.2 C)-97.3 F (36.3 C)] 97.3 F (36.3 C) (12/31 0628) Pulse Rate:  [57-77] 68  (12/31 0628) Resp:  [20] 20  (12/31 0628) BP: (131-150)/(73-97) 150/75 mmHg (12/31 0628) SpO2:  [91 %-97 %] 92 % (12/31 0802) FiO2 (%):  [21 %] 21 % (12/30 1545) Weight change:  Last BM Date: 10/28/12  Intake/Output from previous day: 12/30 0701 - 12/31 0700 In: 4832.5 [P.O.:720; I.V.:3562.5; IV Piggyback:550] Out: 4 [Urine:4]  PHYSICAL EXAM General appearance: alert and mild distress Resp: rales LUL Cardio: regular rate and rhythm, S1, S2 normal, no murmur, click, rub or gallop GI: soft, non-tender; bowel sounds normal; no masses,  no organomegaly Extremities: extremities normal, atraumatic, no cyanosis or edema  Lab Results:    Basic Metabolic Panel:  Basename 10/28/12 0439 10/27/12 0519 10/26/12 1604  NA 127* 127* --  K 3.2* 2.5* --  CL 89* 85* --  CO2 30 31 --  GLUCOSE 144* 154* --  BUN 5* 5* --  CREATININE 0.31* 0.37* --  CALCIUM 8.3* 8.0* --  MG -- -- 2.3  PHOS -- -- --   Liver Function Tests:  Basename 10/28/12 0439 10/27/12 0519  AST 50* 28  ALT 37 27  ALKPHOS 122* 166*  BILITOT 0.3 0.3  PROT 6.0 5.9*  ALBUMIN 2.0* 2.0*   No results found for this basename: LIPASE:2,AMYLASE:2 in the last 72 hours No results found for this basename: AMMONIA:2 in the last 72 hours CBC:  Basename 10/28/12 0439 10/27/12 0519  WBC 10.1 12.1*  NEUTROABS 7.9* 10.2*  HGB 11.0* 11.4*  HCT 31.1* 31.6*  MCV 104.7* 102.9*  PLT 302 307   Cardiac Enzymes: No results found for this basename: CKTOTAL:3,CKMB:3,CKMBINDEX:3,TROPONINI:3 in the last 72 hours BNP: No results found for this basename: PROBNP:3 in the last 72 hours D-Dimer:  [facility] 10/26/12 1604  DDIMER 1.10*   CBG: No  results found for this basename: GLUCAP:6 in the last 72 hours Hemoglobin A1C: No results found for this basename: HGBA1C in the last 72 hours Fasting Lipid Panel: No results found for this basename: CHOL,HDL,LDLCALC,TRIG,CHOLHDL,LDLDIRECT in the last 72 hours Thyroid Function Tests:  Basename 10/27/12 0519  TSH 0.346*  T4TOTAL --  FREET4 --  T3FREE --  THYROIDAB --   Anemia Panel:  Basename 10/26/12 1604  VITAMINB12 913*  FOLATE 2.2*  FERRITIN 1546*  TIBC 177*  IRON 11*  RETICCTPCT 1.0   Coagulation: No results found for this basename: LABPROT:2,INR:2 in the last 72 hours Urine Drug Screen: Drugs of Abuse     Component Value Date/Time   LABOPIA NONE DETECTED 07/11/2012 0336   LABOPIA NEGATIVE 08/14/2011 1711   COCAINSCRNUR NONE DETECTED 07/11/2012 0336   COCAINSCRNUR NEGATIVE 08/14/2011 1711   LABBENZ NONE DETECTED 07/11/2012 0336   LABBENZ NEGATIVE 08/14/2011 1711   AMPHETMU NONE DETECTED 07/11/2012 0336   AMPHETMU NEGATIVE 08/14/2011 1711   THCU POSITIVE* 07/11/2012 0336   LABBARB NONE DETECTED 07/11/2012 0336    Alcohol Level: No results found for this basename: ETH:2 in the last 72 hours Urinalysis: No results found for this basename: COLORURINE:2,APPERANCEUR:2,LABSPEC:2,PHURINE:2,GLUCOSEU:2,HGBUR:2,BILIRUBINUR:2,KETONESUR:2,PROTEINUR:2,UROBILINOGEN:2,NITRITE:2,LEUKOCYTESUR:2 in the last 72 hours Misc. Labs:  ABGS No results found for this basename: PHART,PCO2,PO2ART,TCO2,HCO3 in the last 72 hours CULTURES Recent Results (from the past 240 hour(s))  CULTURE, BLOOD (ROUTINE  X 2)     Status: Normal (Preliminary result)   Collection Time   10/26/12  3:17 PM      Component Value Range Status Comment   Specimen Description RIGHT ANTECUBITAL   Final    Special Requests BOTTLES DRAWN AEROBIC AND ANAEROBIC 6CC   Final    Culture NO GROWTH 1 DAY   Final    Report Status PENDING   Incomplete   CULTURE, BLOOD (ROUTINE X 2)     Status: Normal (Preliminary result)    Collection Time   10/26/12  3:18 PM      Component Value Range Status Comment   Specimen Description LEFT ANTECUBITAL   Final    Special Requests BOTTLES DRAWN AEROBIC ONLY 6CC   Final    Culture NO GROWTH 1 DAY   Final    Report Status PENDING   Incomplete    Studies/Results: Dg Chest 2 View  10/26/2012  *RADIOLOGY REPORT*  Clinical Data: Pneumonia.  Cough.  CHEST - 2 VIEW  Comparison: 12/22 and 09/16/2012  Findings: There has been marked progression of the pneumonia in the left upper lobe now extending into the lingula.  There are now patchy infiltrates in the right upper lobe.  The consolidative infiltrate in the left lung apex is now cavitating.  No effusions.  No acute osseous abnormality.  There is an old compression fracture of the mid thoracic spine.  IMPRESSION: Marked progression of left upper lobe pneumonia.  New right upper lobe pneumonia.  The patient has developed multiple areas of cavitation in the areas of pneumonia.   Original Report Authenticated By: Francene Boyers, M.D.    Ct Angio Chest W/cm &/or Wo Cm  10/26/2012  *RADIOLOGY REPORT*  Clinical Data: Pneumonia and elevated D-dimer.  Fever, vomiting and dizziness.  CT ANGIOGRAPHY CHEST  Technique:  Multidetector CT imaging of the chest using the standard protocol during bolus administration of intravenous contrast. Multiplanar reconstructed images including MIPs were obtained and reviewed to evaluate the vascular anatomy.  Contrast: OMNIPAQUE IOHEXOL 350 MG/ML SOLN  Comparison: Chest radiograph 10/26/2012 and CT chest 12/27/2010.  Findings: Mediastinal lymph nodes measure up to 10 mm in the low right paratracheal station.  Bihilar lymph nodes measure up to 1.2 cm on the right.  No axillary adenopathy.  Atherosclerotic calcification of the arterial vasculature, including coronary arteries.  Heart size normal.  Left ventricle appears dilated.  No pericardial effusion.  There is patchy bilateral air space consolidation with many  areas of associated cavitation.  Findings are worst in the left upper lobe.  Patchy areas of peribronchovascular nodularity and septal thickening are seen as well.  Overall, the distribution appears peribronchovascular.  No pleural fluid.  Dependent debris is seen in the trachea and left mainstem bronchus.  Incidental imaging of the upper abdomen shows lymph nodes measuring up to 8 mm in the portacaval station.  No worrisome lytic or sclerotic lesions.  IMPRESSION:  1.  Multilobar airspace consolidation, predominantly cavitary in appearance.  Findings are worst in the left upper lobe.  Associated septal thickening and peribronchovascular nodularity bilaterally. Necrotizing pneumonia is favored.  Septic emboli cannot be excluded. 2.  Mediastinal and hilar adenopathy, likely reactive. 3.  Age advanced coronary artery calcification.   Original Report Authenticated By: Leanna Battles, M.D.     Medications:  Scheduled:   . albuterol  2.5 mg Nebulization Q6H  . carbamazepine  200 mg Oral TID  . enoxaparin (LOVENOX) injection  40 mg Subcutaneous Q24H  .  feeding supplement  1 Container Oral TID BM  . guaiFENesin  1,200 mg Oral BID  . ipratropium  0.5 mg Nebulization Q6H  . levETIRAcetam  500 mg Oral BID  . levofloxacin (LEVAQUIN) IV  500 mg Intravenous Q24H  . methylPREDNISolone sodium succinate  125 mg Intravenous Q8H  . pantoprazole  40 mg Oral Q1200  . piperacillin-tazobactam (ZOSYN)  IV  3.375 g Intravenous Q8H  . potassium chloride  10 mEq Intravenous Q1 Hr x 4  . sodium chloride  1,000 mL Intravenous Once  . vancomycin  500 mg Intravenous Q8H   Continuous:   . sodium chloride 150 mL/hr at 10/27/12 1319   ZOX:[id number]  Assesment: He has healthcare associated pneumonia. He has chronic pain. He has a history of significant alcohol abuse. He says he is going to check out AGAINST MEDICAL ADVICE. I advised him not to do that and to finish his treatment for pneumonia Active Problems:  * No  active hospital problems. *     Plan: Continue antibiotics if he stays in the hospital    LOS: 2 days   Annasofia Pohl L 10/28/2012, 9:01 AM

## 2012-10-28 NOTE — Progress Notes (Signed)
ANTIBIOTIC CONSULT NOTE   Pharmacy Consult for Zosyn, Vancomycin, and Levaquin Indication: pneumonia  No Known Allergies  Patient Measurements: Height: 5\' 9"  (175.3 cm) Weight: 114 lb 6.7 oz (51.9 kg) IBW/kg (Calculated) : 70.7   Vital Signs: Temp: 97.3 F (36.3 C) (12/31 0628) Temp src: Oral (12/31 0628) BP: 150/75 mmHg (12/31 0628) Pulse Rate: 68  (12/31 0628) Intake/Output from previous day: 12/30 0701 - 12/31 0700 In: 4832.5 [P.O.:720; I.V.:3562.5; IV Piggyback:550] Out: 4 [Urine:4] Intake/Output from this shift: Total I/O In: 240 [P.O.:240] Out: -   Labs:  Basename 10/28/12 0439 10/27/12 0519 10/26/12 1312  WBC 10.1 12.1* 20.3*  HGB 11.0* 11.4* 13.0  PLT 302 307 335  LABCREA -- -- --  CREATININE 0.31* 0.37* 0.56   Estimated Creatinine Clearance: 92.8 ml/min (by C-G formula based on Cr of 0.31).  Basename 10/28/12 1141  VANCOTROUGH <5.0*  VANCOPEAK --  Drue Dun --  GENTTROUGH --  GENTPEAK --  GENTRANDOM --  TOBRATROUGH --  TOBRAPEAK --  TOBRARND --  AMIKACINPEAK --  AMIKACINTROU --  AMIKACIN --    Microbiology: Recent Results (from the past 720 hour(s))  CULTURE, BLOOD (ROUTINE X 2)     Status: Normal (Preliminary result)   Collection Time   10/26/12  3:17 PM      Component Value Range Status Comment   Specimen Description RIGHT ANTECUBITAL   Final    Special Requests BOTTLES DRAWN AEROBIC AND ANAEROBIC 6CC   Final    Culture NO GROWTH 2 DAYS   Final    Report Status PENDING   Incomplete   CULTURE, BLOOD (ROUTINE X 2)     Status: Normal (Preliminary result)   Collection Time   10/26/12  3:18 PM      Component Value Range Status Comment   Specimen Description LEFT ANTECUBITAL   Final    Special Requests BOTTLES DRAWN AEROBIC ONLY 6CC   Final    Culture NO GROWTH 2 DAYS   Final    Report Status PENDING   Incomplete    Medical History: Past Medical History  Diagnosis Date  . Pancreatitis   . Seizures   . Back pain   . ETOH abuse   .  COPD (chronic obstructive pulmonary disease)   . SAH (subarachnoid hemorrhage)   . Macrocytosis 08/17/2011  . Bradycardia 08/17/2011  . Pneumonia    Medications:  Scheduled:     . albuterol  2.5 mg Nebulization Q6H  . carbamazepine  200 mg Oral TID  . enoxaparin (LOVENOX) injection  40 mg Subcutaneous Q24H  . feeding supplement  1 Container Oral TID BM  . folic acid  1 mg Oral Daily  . guaiFENesin  1,200 mg Oral BID  . ipratropium  0.5 mg Nebulization Q6H  . levETIRAcetam  500 mg Oral BID  . levofloxacin  750 mg Oral Q24H  . methylPREDNISolone sodium succinate  80 mg Intravenous Q8H  . pantoprazole  40 mg Oral Q1200  . piperacillin-tazobactam (ZOSYN)  IV  3.375 g Intravenous Q8H  . [COMPLETED] potassium chloride  10 mEq Intravenous Q1 Hr x 5  . [COMPLETED] potassium chloride  10 mEq Intravenous Q1 Hr x 4  . sodium chloride  1,000 mL Intravenous Once  . [COMPLETED] thiamine  100 mg Intravenous Daily  . [DISCONTINUED] albuterol  2.5 mg Nebulization Q4H  . [DISCONTINUED] ipratropium  0.5 mg Nebulization Q4H  . [DISCONTINUED] levofloxacin (LEVAQUIN) IV  500 mg Intravenous Q24H  . [DISCONTINUED] methylPREDNISolone sodium succinate  125  mg Intravenous Q8H  . [DISCONTINUED] vancomycin  500 mg Intravenous Q8H   Assessment: 36yo male admitted for pna.  On Vancomycin, Zosyn, and Levaquin.  Good renal fxn.  Estimated Creatinine Clearance: 92.8 ml/min (by C-G formula based on Cr of 0.31).  Trough level below goal.  Excellent Vancomycin clearance.  Goal of Therapy:  Vancomycin trough level 15-20 mcg/ml  Plan:  Zosyn 3.375 GM IV every 8 hours Increase Vancomycin to 1gm iv q12hrs Re-check Vancomycin trough when appropriate & weekly Levaquin changed to 750mg  PO q24hrs Duration of therapy per MD Monitor labs, renal fxn, and cultures per protocol  Valrie Hart A 10/28/2012,2:12 PM

## 2012-10-28 NOTE — Progress Notes (Signed)
[name], [name] NO.:  [id number]  MEDICAL RECORD NO.:  [id number]  LOCATION:  A327                          FACILITY:  [facility]  PHYSICIAN:  Melvyn Novas, MDDATE OF BIRTH:  1976/08/14  DATE OF PROCEDURE: DATE OF DISCHARGE:                                PROGRESS NOTE   HISTORY OF PRESENT ILLNESS:  The patient came in with left upper lobe pneumonia and some infiltrate in the right upper lobe, also with concomitant COPD, ethanol history with significant hyponatremia and hypokalemia, now being rectified.  CT angio of the chest reveals no definite evidence of pulmonary embolism, however, there are consistent indications of possible necrotizing pneumonia and septic emboli cannot be excluded.  He is currently on vanc and Zosyn, and aggressive nebulizer therapy.  White count has diminished from 20,000 to 12.1. Sodium improved from 117-127, potassium 2.5 being repleted again, creatinine 0.37.  PHYSICAL EXAMINATION:  VITAL SIGNS:  Blood pressure 115/64, temperature 97.5, pulse 71 and regular, respiratory rate is 20. LUNGS:  Coarse rhonchi.  Mild end-expiratory wheeze bilaterally. Diminished breath sounds at the bases.  No rales audible. HEART:  Regular rate and rhythm.  No S3, S4.  No heaves, thrills, or rubs. ABDOMEN:  Soft, nontender.  Bowel sounds normoactive.  PLAN:  Right now is to continue vanc and Zosyn, as well as DuoNeb nebulizer.  Continue Lovenox and Solu-Medrol 125 IV.  Wheezing is much less predominant upon admission.  He is also given thiamine.  Awaiting consult from Pulmonology for further recommendations.     Melvyn Novas, MD     RMD/MEDQ  D:  10/27/2012  T:  10/28/2012  Job:  [id number]

## 2012-10-28 NOTE — Progress Notes (Signed)
PHARMACIST - PHYSICIAN COMMUNICATION DR:   Hawkins and [name] CONCERNING: Antibiotic IV to Oral Route Change Policy  RECOMMENDATION: This patient is receiving Levaquin by the intravenous route.  Based on criteria approved by the Pharmacy and Therapeutics Committee, the antibiotic(s) is/are being converted to the equivalent oral dose form(s).   DESCRIPTION: These criteria include:  Patient being treated for a respiratory tract infection, urinary tract infection, or cellulitis  The patient is not neutropenic and does not exhibit a GI malabsorption state  The patient is eating (either orally or via tube) and/or has been taking other orally administered medications for a least 24 hours  The patient is improving clinically and has a Tmax < 100.5  If you have questions about this conversion, please contact the Pharmacy Department  [x]  ( [phone] )  [facility] []  ( [phone] )  [facility]  []  ( [phone] )  [facility] []  ( [phone] )  [facility]    S. Tobi Leinweber, PharmD 

## 2012-10-28 NOTE — Progress Notes (Signed)
[id number] 

## 2012-10-29 LAB — CBC WITH DIFFERENTIAL/PLATELET
Basophils Absolute: 0 10*3/uL (ref 0.0–0.1)
HCT: 30.7 % — ABNORMAL LOW (ref 39.0–52.0)
Lymphocytes Relative: 19 % (ref 12–46)
Neutro Abs: 8.2 10*3/uL — ABNORMAL HIGH (ref 1.7–7.7)
Platelets: 340 10*3/uL (ref 150–400)
RBC: 2.88 MIL/uL — ABNORMAL LOW (ref 4.22–5.81)
RDW: 13.4 % (ref 11.5–15.5)
WBC: 11.4 10*3/uL — ABNORMAL HIGH (ref 4.0–10.5)

## 2012-10-29 LAB — HEPATIC FUNCTION PANEL
Alkaline Phosphatase: 134 U/L — ABNORMAL HIGH (ref 39–117)
Indirect Bilirubin: 0.2 mg/dL — ABNORMAL LOW (ref 0.3–0.9)
Total Bilirubin: 0.3 mg/dL (ref 0.3–1.2)
Total Protein: 6.2 g/dL (ref 6.0–8.3)

## 2012-10-29 LAB — BASIC METABOLIC PANEL
CO2: 29 mEq/L (ref 19–32)
Calcium: 8.4 mg/dL (ref 8.4–10.5)
Creatinine, Ser: 0.3 mg/dL — ABNORMAL LOW (ref 0.50–1.35)
GFR calc non Af Amer: >90 mL/min (ref >90)

## 2012-10-29 MED ORDER — TRAMADOL HCL 50 MG PO TABS: 50.0000 mg | ORAL_TABLET | Freq: Four times a day (QID) | ORAL | Status: DC | PRN

## 2012-10-29 MED ORDER — POTASSIUM CHLORIDE ER 10 MEQ PO TBCR: 10.0000 meq | EXTENDED_RELEASE_TABLET | Freq: Two times a day (BID) | ORAL | Status: DC

## 2012-10-29 MED ORDER — AMOXICILLIN-POT CLAVULANATE 875-125 MG PO TABS: 1.0000 | ORAL_TABLET | Freq: Two times a day (BID) | ORAL | Status: DC

## 2012-10-29 MED ORDER — AMOXICILLIN-POT CLAVULANATE 875-125 MG PO TABS
1.0000 | ORAL_TABLET | Freq: Two times a day (BID) | ORAL | Status: DC
Start: 2012-10-29 — End: 2012-10-29
  Administered 2012-10-29: 1 via ORAL
  Filled 2012-10-29: qty 1

## 2012-10-29 MED ORDER — POTASSIUM CHLORIDE CRYS ER 20 MEQ PO TBCR
40.0000 meq | EXTENDED_RELEASE_TABLET | Freq: Once | ORAL | Status: AC
  Administered 2012-10-29: 40 meq via ORAL
  Filled 2012-10-29: qty 2

## 2012-10-29 NOTE — Progress Notes (Signed)
[name], [name] NO.:  [id number]  MEDICAL RECORD NO.:  [id number]  LOCATION:  A327                          FACILITY:  [facility]  PHYSICIAN:  Melvyn Novas, MDDATE OF BIRTH:  08-Feb-1976  DATE OF PROCEDURE: DATE OF DISCHARGE:                                PROGRESS NOTE   The patient has polysubstance abuse, nonethanolic, has left upper lobe infiltrate, possible necrotizing pneumonia, health-care associated pneumonia.  Dr. Juanetta Gosling has added Levaquin to the vanc and Zosyn and currently on methylprednisolone also for bronchospastic component and nebulizer therapy.  He was admitted with concomitant hyponatremia, hypokalemia.  His potassium is 3.2 this morning.  We will give additional p.o. and IV potassium today and check BMET in a.m.  Lungs show prolonged expiratory phase, mild end-expiratory wheeze, scattered rhonchi.  No rales appreciable.  Heart, regular rhythm, no murmurs, gallops, heaves, thrills, or rubs.  The patient currently has oxycodone increased to q.4 h.  This is twice the dose of normal which was 5 mg t.i.d. before admission and he complains of low back pain, not pleuritic pain so much.  I was very firm that there will be no further increase in opioid analgesics and the patient is not quite happy with this.  I stressed the importance of finishing his course of therapy.  The plan right now is continue triple antibiotics.  Solu-Medrol will be reduced to 80 q.8.  Continue anti seizure medicines, oxycodone 5 q.4 h. p.r.n. and DuoNeb nebulizer.     Melvyn Novas, MD     RMD/MEDQ  D:  10/28/2012  T:  10/29/2012  Job:  [id number]

## 2012-10-29 NOTE — Progress Notes (Signed)
Discharged to home. Prescriptions and discharge instructions given. Patient in a hurry to leave. Ride waiting outside. IV removed with no difficulty. Clean, dry and intact on discharge. Vitals WNL. Augmentin and 40 KCL given prior to DC.

## 2012-10-29 NOTE — Progress Notes (Signed)
Subjective: He says he feels well and wants to go home. He threatened to leave AGAINST MEDICAL ADVICE yesterday. His potassium is still somewhat low. He says he's going to leave AGAINST MEDICAL ADVICE unless he is discharged. He is up and ambulating in the hall. He is not short of breath.  Objective: Vital signs in last 24 hours: Temp:  [97.1 F (36.2 C)-98.2 F (36.8 C)] 97.1 F (36.2 C) (01/01 0457) Pulse Rate:  [64-78] 72  (01/01 0457) Resp:  [20] 20  (01/01 0457) BP: (133-165)/(66-96) 165/85 mmHg (01/01 0457) SpO2:  [9 %-98 %] 95 % (01/01 0833) Weight change:  Last BM Date: 11/28/12  Intake/Output from previous day: 12/31 0701 - 01/01 0700 In: 2772.5 [P.O.:720; I.V.:1802.5; IV Piggyback:250] Out: 4 [Urine:4]  PHYSICAL EXAM General appearance: alert and no distress Resp: clear to auscultation bilaterally Cardio: regular rate and rhythm, S1, S2 normal, no murmur, click, rub or gallop GI: soft, non-tender; bowel sounds normal; no masses,  no organomegaly Extremities: extremities normal, atraumatic, no cyanosis or edema  Lab Results:    Basic Metabolic Panel:  Basename 10/29/12 0538 10/28/12 0439 10/26/12 1604  NA 128* 127* --  K 2.9* 3.2* --  CL 90* 89* --  CO2 29 30 --  GLUCOSE 118* 144* --  BUN 5* 5* --  CREATININE 0.30* 0.31* --  CALCIUM 8.4 8.3* --  MG -- -- 2.3  PHOS -- -- --   Liver Function Tests:  Basename 10/29/12 0538 10/28/12 0439  AST 46* 50*  ALT 42 37  ALKPHOS 134* 122*  BILITOT 0.3 0.3  PROT 6.2 6.0  ALBUMIN 2.3* 2.0*   No results found for this basename: LIPASE:2,AMYLASE:2 in the last 72 hours No results found for this basename: AMMONIA:2 in the last 72 hours CBC:  Basename 10/29/12 0538 10/28/12 0439  WBC 11.4* 10.1  NEUTROABS 8.2* 7.9*  HGB 10.9* 11.0*  HCT 30.7* 31.1*  MCV 106.6* 104.7*  PLT 340 302   Cardiac Enzymes: No results found for this basename: CKTOTAL:3,CKMB:3,CKMBINDEX:3,TROPONINI:3 in the last 72 hours BNP: No  results found for this basename: PROBNP:3 in the last 72 hours D-Dimer:  [facility] 10/26/12 1604  DDIMER 1.10*   CBG: No results found for this basename: GLUCAP:6 in the last 72 hours Hemoglobin A1C: No results found for this basename: HGBA1C in the last 72 hours Fasting Lipid Panel: No results found for this basename: CHOL,HDL,LDLCALC,TRIG,CHOLHDL,LDLDIRECT in the last 72 hours Thyroid Function Tests:  Basename 10/27/12 0519  TSH 0.346*  T4TOTAL --  FREET4 --  T3FREE --  THYROIDAB --   Anemia Panel:  Basename 10/26/12 1604  VITAMINB12 913*  FOLATE 2.2*  FERRITIN 1546*  TIBC 177*  IRON 11*  RETICCTPCT 1.0   Coagulation: No results found for this basename: LABPROT:2,INR:2 in the last 72 hours Urine Drug Screen: Drugs of Abuse     Component Value Date/Time   LABOPIA NONE DETECTED 07/11/2012 0336   LABOPIA NEGATIVE 08/14/2011 1711   COCAINSCRNUR NONE DETECTED 07/11/2012 0336   COCAINSCRNUR NEGATIVE 08/14/2011 1711   LABBENZ NONE DETECTED 07/11/2012 0336   LABBENZ NEGATIVE 08/14/2011 1711   AMPHETMU NONE DETECTED 07/11/2012 0336   AMPHETMU NEGATIVE 08/14/2011 1711   THCU POSITIVE* 07/11/2012 0336   LABBARB NONE DETECTED 07/11/2012 0336    Alcohol Level: No results found for this basename: ETH:2 in the last 72 hours Urinalysis: No results found for this basename: COLORURINE:2,APPERANCEUR:2,LABSPEC:2,PHURINE:2,GLUCOSEU:2,HGBUR:2,BILIRUBINUR:2,KETONESUR:2,PROTEINUR:2,UROBILINOGEN:2,NITRITE:2,LEUKOCYTESUR:2 in the last 72 hours Misc. Labs:  ABGS No results found for this basename:  PHART,PCO2,PO2ART,TCO2,HCO3 in the last 72 hours CULTURES Recent Results (from the past 240 hour(s))  CULTURE, BLOOD (ROUTINE X 2)     Status: Normal (Preliminary result)   Collection Time   10/26/12  3:17 PM      Component Value Range Status Comment   Specimen Description RIGHT ANTECUBITAL   Final    Special Requests BOTTLES DRAWN AEROBIC AND ANAEROBIC 6CC   Final    Culture NO GROWTH 2  DAYS   Final    Report Status PENDING   Incomplete   CULTURE, BLOOD (ROUTINE X 2)     Status: Normal (Preliminary result)   Collection Time   10/26/12  3:18 PM      Component Value Range Status Comment   Specimen Description LEFT ANTECUBITAL   Final    Special Requests BOTTLES DRAWN AEROBIC ONLY 6CC   Final    Culture NO GROWTH 2 DAYS   Final    Report Status PENDING   Incomplete    Studies/Results: No results found.  Medications:  Prior to Admission:  Prescriptions prior to admission  Medication Sig Dispense Refill  . albuterol (PROVENTIL HFA;VENTOLIN HFA) 108 (90 BASE) MCG/ACT inhaler Inhale 1-2 puffs into the lungs every 6 (six) hours as needed for wheezing.  1 Inhaler  0  . carbamazepine (TEGRETOL) 200 MG tablet Take 200-400 mg by mouth 3 (three) times daily. Take 2 tablets in the morning, 1 tablet at lunch and 2 tablets at bedtime.      . levETIRAcetam (KEPPRA) 500 MG tablet Take 500 mg by mouth 2 (two) times daily.       Marland Kitchen levofloxacin (LEVAQUIN) 500 MG tablet Take 1 tablet (500 mg total) by mouth daily.  7 tablet  0  . ondansetron (ZOFRAN) 4 MG tablet Take 1 tablet (4 mg total) by mouth every 6 (six) hours.  12 tablet  0  . oxyCODONE-acetaminophen (TYLOX) 5-500 MG per capsule Take 1 capsule by mouth every 6 (six) hours as needed. For pain  30 capsule  0   Scheduled:   . albuterol  2.5 mg Nebulization Q6H  . amoxicillin-clavulanate  1 tablet Oral Q12H  . carbamazepine  200 mg Oral TID  . enoxaparin (LOVENOX) injection  40 mg Subcutaneous Q24H  . feeding supplement  1 Container Oral TID BM  . folic acid  1 mg Oral Daily  . guaiFENesin  1,200 mg Oral BID  . ipratropium  0.5 mg Nebulization Q6H  . levETIRAcetam  500 mg Oral BID  . levofloxacin  750 mg Oral Q24H  . methylPREDNISolone sodium succinate  80 mg Intravenous Q8H  . pantoprazole  40 mg Oral Q1200  . piperacillin-tazobactam (ZOSYN)  IV  3.375 g Intravenous Q8H  . potassium chloride  40 mEq Oral Once  . sodium  chloride  1,000 mL Intravenous Once  . vancomycin  1,000 mg Intravenous Q12H   Continuous:   . sodium chloride 150 mL/hr at 10/29/12 1914   NWG:[id number]  Assesment: He has pneumonia which I think is probably aspiration. He is doing much better as far as his breathing is concerned. He is hypokalemic. After much discussion with him and promises from him that he will followup with Dr. Delbert Harness in the next few days I'm going to replace his potassium orally switch him to Augmentin and send him home Active Problems:  * No active hospital problems. *     Plan: Discharge home    LOS: 3 days   Alayjah Boehringer L 10/29/2012,  11:07 AM

## 2012-10-31 NOTE — Discharge Summary (Signed)
Physician Discharge Summary  Patient ID: [name] MRN: [id number] DOB/AGE: [birth year] 37 y.o. Primary Care Physician:ROY, MARK, MD Admit date: 10/26/2012 Discharge date: 10/31/2012    Discharge Diagnoses:  Healthcare associated pneumonia Active Problems:  * No active hospital problems. *   history of alcoholic hepatitis and pancreatitis History of subarachnoid hemorrhage Alcoholism COPD Seizure disorder Chronic back pain Hyponatremia Hypokalemia    Medication List     As of 10/31/2012  8:56 PM    TAKE these medications         albuterol 108 (90 BASE) MCG/ACT inhaler   Commonly known as: PROVENTIL HFA;VENTOLIN HFA   Inhale 1-2 puffs into the lungs every 6 (six) hours as needed for wheezing.      amoxicillin-clavulanate 875-125 MG per tablet   Commonly known as: AUGMENTIN   Take 1 tablet by mouth every 12 (twelve) hours.      carbamazepine 200 MG tablet   Commonly known as: TEGRETOL   Take 200-400 mg by mouth 3 (three) times daily. Take 2 tablets in the morning, 1 tablet at lunch and 2 tablets at bedtime.      levETIRAcetam 500 MG tablet   Commonly known as: KEPPRA   Take 500 mg by mouth 2 (two) times daily.      levofloxacin 500 MG tablet   Commonly known as: LEVAQUIN   Take 1 tablet (500 mg total) by mouth daily.      ondansetron 4 MG tablet   Commonly known as: ZOFRAN   Take 1 tablet (4 mg total) by mouth every 6 (six) hours.      oxyCODONE-acetaminophen 5-500 MG per capsule   Commonly known as: TYLOX   Take 1 capsule by mouth every 6 (six) hours as needed. For pain      potassium chloride 10 MEQ tablet   Commonly known as: K-DUR   Take 1 tablet (10 mEq total) by mouth 2 (two) times daily.      traMADol 50 MG tablet   Commonly known as: ULTRAM   Take 1 tablet (50 mg total) by mouth every 6 (six) hours as needed for pain.        Discharged Condition: Improved    Consults: Pulmonary  Significant Diagnostic Studies: Dg Chest 2  View  10/26/2012  *RADIOLOGY REPORT*  Clinical Data: Pneumonia.  Cough.  CHEST - 2 VIEW  Comparison: 12/22 and 09/16/2012  Findings: There has been marked progression of the pneumonia in the left upper lobe now extending into the lingula.  There are now patchy infiltrates in the right upper lobe.  The consolidative infiltrate in the left lung apex is now cavitating.  No effusions.  No acute osseous abnormality.  There is an old compression fracture of the mid thoracic spine.  IMPRESSION: Marked progression of left upper lobe pneumonia.  New right upper lobe pneumonia.  The patient has developed multiple areas of cavitation in the areas of pneumonia.   Original Report Authenticated By: Francene Boyers, M.D.    Ct Angio Chest W/cm &/or Wo Cm  10/26/2012  *RADIOLOGY REPORT*  Clinical Data: Pneumonia and elevated D-dimer.  Fever, vomiting and dizziness.  CT ANGIOGRAPHY CHEST  Technique:  Multidetector CT imaging of the chest using the standard protocol during bolus administration of intravenous contrast. Multiplanar reconstructed images including MIPs were obtained and reviewed to evaluate the vascular anatomy.  Contrast: OMNIPAQUE IOHEXOL 350 MG/ML SOLN  Comparison: Chest radiograph 10/26/2012 and CT chest 12/27/2010.  Findings: Mediastinal lymph nodes measure  up to 10 mm in the low right paratracheal station.  Bihilar lymph nodes measure up to 1.2 cm on the right.  No axillary adenopathy.  Atherosclerotic calcification of the arterial vasculature, including coronary arteries.  Heart size normal.  Left ventricle appears dilated.  No pericardial effusion.  There is patchy bilateral air space consolidation with many areas of associated cavitation.  Findings are worst in the left upper lobe.  Patchy areas of peribronchovascular nodularity and septal thickening are seen as well.  Overall, the distribution appears peribronchovascular.  No pleural fluid.  Dependent debris is seen in the trachea and left mainstem  bronchus.  Incidental imaging of the upper abdomen shows lymph nodes measuring up to 8 mm in the portacaval station.  No worrisome lytic or sclerotic lesions.  IMPRESSION:  1.  Multilobar airspace consolidation, predominantly cavitary in appearance.  Findings are worst in the left upper lobe.  Associated septal thickening and peribronchovascular nodularity bilaterally. Necrotizing pneumonia is favored.  Septic emboli cannot be excluded. 2.  Mediastinal and hilar adenopathy, likely reactive. 3.  Age advanced coronary artery calcification.   Original Report Authenticated By: Leanna Battles, M.D.    Dg Chest Portable 1 View  10/19/2012  *RADIOLOGY REPORT*  Clinical Data: Shortness of breath  PORTABLE CHEST - 1 VIEW  Comparison: 10/08/2012  Findings: Interval development of left upper lobe consolidation, in keeping with pneumonia.  No cavitation. There is background hyperinflation/interstitial coarsening.  Cardiomediastinal contours within normal limits. No pleural effusion or pneumothorax.  No acute osseous finding.  IMPRESSION: Left upper lobe pneumonia has developed in the interval. Recommend radiograph follow-up after therapy to document resolution.   Original Report Authenticated By: Jearld Lesch, M.D.    Dg Abd Acute W/chest  10/08/2012  *RADIOLOGY REPORT*  Clinical Data: Severe abdominal pain.  History of pancreatitis  ACUTE ABDOMEN SERIES (ABDOMEN 2 VIEW & CHEST 1 VIEW)  Comparison: 09/16/2012  Findings: COPD with pulmonary hyperinflation.  Negative for infiltrate or effusion.  No heart failure or mass lesion.  Normal bowel gas pattern.  No bowel obstruction or free air.  No renal calculi and no significant bony abnormality.  IMPRESSION: COPD.  No acute cardiopulmonary disease.  Negative abdomen.   Original Report Authenticated By: Janeece Riggers, M.D.     Lab Results: Basic Metabolic Panel:  Basename 10/29/12 0538  NA 128*  K 2.9*  CL 90*  CO2 29  GLUCOSE 118*  BUN 5*  CREATININE 0.30*   CALCIUM 8.4  MG --  PHOS --   Liver Function Tests:  [facility] 10/29/12 0538  AST 46*  ALT 42  ALKPHOS 134*  BILITOT 0.3  PROT 6.2  ALBUMIN 2.3*     CBC:  Basename 10/29/12 0538  WBC 11.4*  NEUTROABS 8.2*  HGB 10.9*  HCT 30.7*  MCV 106.6*  PLT 340    Recent Results (from the past 240 hour(s))  CULTURE, BLOOD (ROUTINE X 2)     Status: Normal (Preliminary result)   Collection Time   10/26/12  3:17 PM      Component Value Range Status Comment   Specimen Description RIGHT ANTECUBITAL   Final    Special Requests BOTTLES DRAWN AEROBIC AND ANAEROBIC 6CC   Final    Culture NO GROWTH 4 DAYS   Final    Report Status PENDING   Incomplete   CULTURE, BLOOD (ROUTINE X 2)     Status: Normal (Preliminary result)   Collection Time   10/26/12  3:18 PM  Component Value Range Status Comment   Specimen Description LEFT ANTECUBITAL   Final    Special Requests BOTTLES DRAWN AEROBIC ONLY 6CC   Final    Culture NO GROWTH 4 DAYS   Final    Report Status PENDING   Incomplete      Hospital Course: He was admitted with cough fever chills and diminished sensorium. He was found to have a left upper lobe pneumonia. His sodium was very low and his potassium was very low. He was started on 3 antibiotics for presumed healthcare associated pneumonia but after further questioning it appeared that he had probably been drinking heavily and had been unconscious and it was felt that he probably had aspiration pneumonia. After about 2 days in the hospital he began demanding to be discharged home. Initially he agreed to stay 24 more hours and then the next day he said he is going to check out AGAINST MEDICAL ADVICE if he wasn't discharged. His potassium is still somewhat low but clinically he was much better as far as his pneumonia was concerned. His sodium had come up. Rather than discharge him AGAINST MEDICAL ADVICE I elected to discharge him on an antibiotic which should cover aspiration  pneumonia.  Discharge Exam: Blood pressure 165/85, pulse 72, temperature 97.1 F (36.2 C), temperature source Oral, resp. rate 20, height 5\' 9"  (1.753 m), weight 51.9 kg (114 lb 6.7 oz), SpO2 95.00%. He was awake and alert. He was ventilating in the halls without difficulty. He did not appear to be short of breath  Disposition: Home      Discharge Orders    Future Orders Please Complete By Expires   Discharge patient           Signed: Fredirick Maudlin Pager [phone]  10/31/2012, 8:56 PM

## 2012-11-01 LAB — CULTURE, BLOOD (ROUTINE X 2)
Culture: NO GROWTH
Culture: NO GROWTH

## 2012-11-02 ENCOUNTER — Emergency Department (HOSPITAL_COMMUNITY): Payer: Medicaid Other

## 2012-11-02 ENCOUNTER — Encounter (HOSPITAL_COMMUNITY): Payer: Self-pay | Admitting: *Deleted

## 2012-11-02 ENCOUNTER — Inpatient Hospital Stay (HOSPITAL_COMMUNITY)
Admission: EM | Admit: 2012-11-02 | Discharge: 2012-11-10 | DRG: 178 | Disposition: A | Discharge status: Home / Self Care | Payer: Medicaid Other | Attending: Family Medicine | Admitting: Family Medicine

## 2012-11-02 DIAGNOSIS — D7589 Other specified diseases of blood and blood-forming organs: Secondary | ICD-10-CM | POA: Diagnosis present

## 2012-11-02 DIAGNOSIS — J4489 Other specified chronic obstructive pulmonary disease: Secondary | ICD-10-CM | POA: Diagnosis present

## 2012-11-02 DIAGNOSIS — E876 Hypokalemia: Secondary | ICD-10-CM | POA: Diagnosis present

## 2012-11-02 DIAGNOSIS — J189 Pneumonia, unspecified organism: Secondary | ICD-10-CM

## 2012-11-02 DIAGNOSIS — F17200 Nicotine dependence, unspecified, uncomplicated: Secondary | ICD-10-CM | POA: Diagnosis present

## 2012-11-02 DIAGNOSIS — IMO0002 Reserved for concepts with insufficient information to code with codable children: Secondary | ICD-10-CM | POA: Diagnosis present

## 2012-11-02 DIAGNOSIS — J15212 Pneumonia due to Methicillin resistant Staphylococcus aureus: Principal | ICD-10-CM | POA: Diagnosis present

## 2012-11-02 DIAGNOSIS — Z91199 Patient's noncompliance with other medical treatment and regimen due to unspecified reason: Secondary | ICD-10-CM

## 2012-11-02 DIAGNOSIS — G8929 Other chronic pain: Secondary | ICD-10-CM | POA: Diagnosis present

## 2012-11-02 DIAGNOSIS — E871 Hypo-osmolality and hyponatremia: Secondary | ICD-10-CM | POA: Diagnosis present

## 2012-11-02 DIAGNOSIS — Z9119 Patient's noncompliance with other medical treatment and regimen: Secondary | ICD-10-CM

## 2012-11-02 DIAGNOSIS — F329 Major depressive disorder, single episode, unspecified: Secondary | ICD-10-CM | POA: Diagnosis present

## 2012-11-02 DIAGNOSIS — G40909 Epilepsy, unspecified, not intractable, without status epilepticus: Secondary | ICD-10-CM | POA: Diagnosis present

## 2012-11-02 DIAGNOSIS — D638 Anemia in other chronic diseases classified elsewhere: Secondary | ICD-10-CM | POA: Diagnosis present

## 2012-11-02 DIAGNOSIS — J449 Chronic obstructive pulmonary disease, unspecified: Secondary | ICD-10-CM | POA: Diagnosis present

## 2012-11-02 DIAGNOSIS — Z79899 Other long term (current) drug therapy: Secondary | ICD-10-CM

## 2012-11-02 DIAGNOSIS — F1910 Other psychoactive substance abuse, uncomplicated: Secondary | ICD-10-CM | POA: Diagnosis present

## 2012-11-02 DIAGNOSIS — F3289 Other specified depressive episodes: Secondary | ICD-10-CM | POA: Diagnosis present

## 2012-11-02 DIAGNOSIS — F411 Generalized anxiety disorder: Secondary | ICD-10-CM | POA: Diagnosis present

## 2012-11-02 DIAGNOSIS — D72829 Elevated white blood cell count, unspecified: Secondary | ICD-10-CM | POA: Diagnosis present

## 2012-11-02 DIAGNOSIS — Z8701 Personal history of pneumonia (recurrent): Secondary | ICD-10-CM

## 2012-11-02 DIAGNOSIS — F1010 Alcohol abuse, uncomplicated: Secondary | ICD-10-CM | POA: Diagnosis present

## 2012-11-02 LAB — CBC WITH DIFFERENTIAL/PLATELET
HCT: 34.3 % — ABNORMAL LOW (ref 39.0–52.0)
Hemoglobin: 11.9 g/dL — ABNORMAL LOW (ref 13.0–17.0)
Lymphocytes Relative: 8 % — ABNORMAL LOW (ref 12–46)
Lymphs Abs: 1.7 10*3/uL (ref 0.7–4.0)
Monocytes Absolute: 1.4 10*3/uL — ABNORMAL HIGH (ref 0.1–1.0)
Monocytes Relative: 6 % (ref 3–12)
Neutro Abs: 19.2 10*3/uL — ABNORMAL HIGH (ref 1.7–7.7)
WBC: 22.3 10*3/uL — ABNORMAL HIGH (ref 4.0–10.5)

## 2012-11-02 LAB — COMPREHENSIVE METABOLIC PANEL
AST: 23 U/L (ref 0–37)
BUN: <3 mg/dL — ABNORMAL LOW (ref 6–23)
CO2: 29 mEq/L (ref 19–32)
Chloride: 85 mEq/L — ABNORMAL LOW (ref 96–112)
Creatinine, Ser: 0.29 mg/dL — ABNORMAL LOW (ref 0.50–1.35)
GFR calc non Af Amer: >90 mL/min (ref >90)
Total Bilirubin: 0.5 mg/dL (ref 0.3–1.2)

## 2012-11-02 LAB — INFLUENZA PANEL BY PCR (TYPE A & B)
H1N1 flu by pcr: NOT DETECTED
Influenza A By PCR: NEGATIVE
Influenza B By PCR: NEGATIVE

## 2012-11-02 LAB — LACTIC ACID, PLASMA: Lactic Acid, Venous: 0.8 mmol/L (ref 0.5–2.2)

## 2012-11-02 MED ORDER — ENOXAPARIN SODIUM 40 MG/0.4ML ~~LOC~~ SOLN
40.0000 mg | SUBCUTANEOUS | Status: DC
  Administered 2012-11-02 – 2012-11-10 (×9): 40 mg via SUBCUTANEOUS
  Filled 2012-11-02 (×9): qty 0.4

## 2012-11-02 MED ORDER — OXYCODONE-ACETAMINOPHEN 5-325 MG PO TABS
1.0000 | ORAL_TABLET | Freq: Once | ORAL | Status: AC
  Administered 2012-11-02: 1 via ORAL
  Filled 2012-11-02: qty 1

## 2012-11-02 MED ORDER — SODIUM CHLORIDE 0.9 % IV BOLUS (SEPSIS)
1000.0000 mL | Freq: Once | INTRAVENOUS | Status: AC
  Administered 2012-11-02: 1000 mL via INTRAVENOUS

## 2012-11-02 MED ORDER — ALBUTEROL SULFATE (5 MG/ML) 0.5% IN NEBU
2.5000 mg | INHALATION_SOLUTION | Freq: Four times a day (QID) | RESPIRATORY_TRACT | Status: DC
  Administered 2012-11-02 – 2012-11-10 (×26): 2.5 mg via RESPIRATORY_TRACT
  Filled 2012-11-02 (×27): qty 0.5

## 2012-11-02 MED ORDER — CARBAMAZEPINE 200 MG PO TABS
400.0000 mg | ORAL_TABLET | Freq: Two times a day (BID) | ORAL | Status: DC
  Administered 2012-11-02 – 2012-11-10 (×16): 400 mg via ORAL
  Filled 2012-11-02 (×18): qty 2
  Filled 2012-11-02 (×3): qty 1
  Filled 2012-11-02 (×2): qty 2

## 2012-11-02 MED ORDER — LEVOFLOXACIN IN D5W 750 MG/150ML IV SOLN
750.0000 mg | INTRAVENOUS | Status: DC
  Administered 2012-11-02 – 2012-11-06 (×5): 750 mg via INTRAVENOUS
  Filled 2012-11-02 (×9): qty 150

## 2012-11-02 MED ORDER — VANCOMYCIN HCL IN DEXTROSE 1-5 GM/200ML-% IV SOLN
1000.0000 mg | Freq: Two times a day (BID) | INTRAVENOUS | Status: DC
  Administered 2012-11-02 – 2012-11-04 (×4): 1000 mg via INTRAVENOUS
  Filled 2012-11-02 (×6): qty 200

## 2012-11-02 MED ORDER — GUAIFENESIN ER 600 MG PO TB12
600.0000 mg | ORAL_TABLET | Freq: Two times a day (BID) | ORAL | Status: DC
  Administered 2012-11-02 – 2012-11-10 (×17): 600 mg via ORAL
  Filled 2012-11-02 (×17): qty 1

## 2012-11-02 MED ORDER — OXYCODONE HCL 5 MG PO TABS
5.0000 mg | ORAL_TABLET | ORAL | Status: DC | PRN
  Administered 2012-11-02 – 2012-11-10 (×43): 5 mg via ORAL
  Filled 2012-11-02 (×46): qty 1

## 2012-11-02 MED ORDER — CARBAMAZEPINE 200 MG PO TABS
200.0000 mg | ORAL_TABLET | ORAL | Status: DC
  Administered 2012-11-02 – 2012-11-09 (×8): 200 mg via ORAL

## 2012-11-02 MED ORDER — ONDANSETRON HCL 4 MG PO TABS: 4.0000 mg | ORAL_TABLET | Freq: Four times a day (QID) | ORAL | Status: DC | PRN

## 2012-11-02 MED ORDER — ALBUTEROL SULFATE (5 MG/ML) 0.5% IN NEBU: 2.5000 mg | INHALATION_SOLUTION | RESPIRATORY_TRACT | Status: DC | PRN

## 2012-11-02 MED ORDER — POTASSIUM CHLORIDE CRYS ER 10 MEQ PO TBCR
10.0000 meq | EXTENDED_RELEASE_TABLET | Freq: Two times a day (BID) | ORAL | Status: DC
  Administered 2012-11-02 – 2012-11-10 (×17): 10 meq via ORAL
  Filled 2012-11-02 (×17): qty 1

## 2012-11-02 MED ORDER — VANCOMYCIN HCL IN DEXTROSE 1-5 GM/200ML-% IV SOLN
1000.0000 mg | Freq: Once | INTRAVENOUS | Status: AC
  Administered 2012-11-02: 1000 mg via INTRAVENOUS
  Filled 2012-11-02: qty 200

## 2012-11-02 MED ORDER — ACETAMINOPHEN 650 MG RE SUPP: 650.0000 mg | Freq: Four times a day (QID) | RECTAL | Status: DC | PRN

## 2012-11-02 MED ORDER — ACETAMINOPHEN 325 MG PO TABS
650.0000 mg | ORAL_TABLET | Freq: Four times a day (QID) | ORAL | Status: DC | PRN
  Administered 2012-11-02: 650 mg via ORAL
  Filled 2012-11-02: qty 2

## 2012-11-02 MED ORDER — IPRATROPIUM BROMIDE 0.02 % IN SOLN
0.5000 mg | Freq: Four times a day (QID) | RESPIRATORY_TRACT | Status: DC
  Administered 2012-11-02 – 2012-11-10 (×26): 0.5 mg via RESPIRATORY_TRACT
  Filled 2012-11-02 (×28): qty 2.5

## 2012-11-02 MED ORDER — IBUPROFEN 800 MG PO TABS
800.0000 mg | ORAL_TABLET | Freq: Once | ORAL | Status: AC
  Administered 2012-11-02: 800 mg via ORAL
  Filled 2012-11-02: qty 1

## 2012-11-02 MED ORDER — ONDANSETRON HCL 4 MG/2ML IJ SOLN: 4.0000 mg | Freq: Four times a day (QID) | INTRAMUSCULAR | Status: DC | PRN

## 2012-11-02 MED ORDER — PIPERACILLIN-TAZOBACTAM 3.375 G IVPB
3.3750 g | Freq: Once | INTRAVENOUS | Status: AC
  Administered 2012-11-02: 3.375 g via INTRAVENOUS
  Filled 2012-11-02: qty 50

## 2012-11-02 MED ORDER — LEVETIRACETAM 500 MG PO TABS
500.0000 mg | ORAL_TABLET | Freq: Two times a day (BID) | ORAL | Status: DC
  Administered 2012-11-02 – 2012-11-10 (×17): 500 mg via ORAL
  Filled 2012-11-02 (×18): qty 1

## 2012-11-02 MED ORDER — PIPERACILLIN-TAZOBACTAM 3.375 G IVPB
3.3750 g | Freq: Three times a day (TID) | INTRAVENOUS | Status: DC
  Administered 2012-11-02 – 2012-11-10 (×24): 3.375 g via INTRAVENOUS
  Filled 2012-11-02 (×27): qty 50

## 2012-11-02 MED ORDER — HYDROMORPHONE HCL PF 1 MG/ML IJ SOLN
0.5000 mg | INTRAMUSCULAR | Status: DC | PRN
  Administered 2012-11-02 – 2012-11-07 (×53): 0.5 mg via INTRAVENOUS
  Filled 2012-11-02 (×53): qty 1

## 2012-11-02 MED ORDER — SODIUM CHLORIDE 0.9 % IV SOLN
INTRAVENOUS | Status: DC
  Administered 2012-11-02 – 2012-11-04 (×3): via INTRAVENOUS
  Administered 2012-11-05: 125 mL/h via INTRAVENOUS
  Administered 2012-11-06 – 2012-11-10 (×8): via INTRAVENOUS

## 2012-11-02 MED ORDER — SODIUM CHLORIDE 0.9 % IV SOLN: INTRAVENOUS | Status: AC

## 2012-11-02 MED ORDER — POTASSIUM CHLORIDE 10 MEQ/100ML IV SOLN
10.0000 meq | INTRAVENOUS | Status: AC
  Administered 2012-11-02: 10 meq via INTRAVENOUS
  Filled 2012-11-02: qty 400

## 2012-11-02 MED ORDER — ALUM & MAG HYDROXIDE-SIMETH 200-200-20 MG/5ML PO SUSP: 30.0000 mL | Freq: Four times a day (QID) | ORAL | Status: DC | PRN

## 2012-11-02 NOTE — ED Notes (Signed)
Lab stated that [facility] were done x 2 prior to antibiotics given

## 2012-11-02 NOTE — Progress Notes (Addendum)
ANTIBIOTIC CONSULT NOTE - INITIAL  Pharmacy Consult for Vancomycin and Zosyn Indication: pneumonia  No Known Allergies  Patient Measurements: Height: 5\' 9"  (175.3 cm) Weight: 120 lb (54.432 kg) IBW/kg (Calculated) : 70.7   Vital Signs: Temp: 100 F (37.8 C) (01/05 1251) Temp src: Oral (01/05 1251) BP: 106/45 mmHg (01/05 1251) Pulse Rate: 81  (01/05 1251) Intake/Output from previous day:   Intake/Output from this shift:    Labs:  Basename 11/02/12 0911  WBC 22.3*  HGB 11.9*  PLT 414*  LABCREA --  CREATININE 0.29*   Estimated Creatinine Clearance: 97.3 ml/min (by C-G formula based on Cr of 0.29). No results found for this basename: VANCOTROUGH:2,VANCOPEAK:2,VANCORANDOM:2,GENTTROUGH:2,GENTPEAK:2,GENTRANDOM:2,TOBRATROUGH:2,TOBRAPEAK:2,TOBRARND:2,AMIKACINPEAK:2,AMIKACINTROU:2,AMIKACIN:2, in the last 72 hours   Microbiology: Recent Results (from the past 720 hour(s))  CULTURE, BLOOD (ROUTINE X 2)     Status: Normal   Collection Time   10/26/12  3:17 PM      Component Value Range Status Comment   Specimen Description RIGHT ANTECUBITAL   Final    Special Requests BOTTLES DRAWN AEROBIC AND ANAEROBIC 6CC   Final    Culture NO GROWTH 6 DAYS   Final    Report Status 11/01/2012 FINAL   Final   CULTURE, BLOOD (ROUTINE X 2)     Status: Normal   Collection Time   10/26/12  3:18 PM      Component Value Range Status Comment   Specimen Description LEFT ANTECUBITAL   Final    Special Requests BOTTLES DRAWN AEROBIC ONLY 6CC   Final    Culture NO GROWTH 6 DAYS   Final    Report Status 11/01/2012 FINAL   Final   CULTURE, BLOOD (ROUTINE X 2)     Status: Normal (Preliminary result)   Collection Time   11/02/12 10:33 AM      Component Value Range Status Comment   Specimen Description BLOOD LEFT HAND   Final    Special Requests BOTTLES DRAWN AEROBIC AND ANAEROBIC 4CC   Final    Culture PENDING   Incomplete    Report Status PENDING   Incomplete   CULTURE, BLOOD (ROUTINE X 2)      Status: Normal (Preliminary result)   Collection Time   11/02/12 10:50 AM      Component Value Range Status Comment   Specimen Description BLOOD RIGHT FOREARM   Final    Special Requests 4CC   Final    Culture PENDING   Incomplete    Report Status PENDING   Incomplete     Medical History: Past Medical History  Diagnosis Date  . Pancreatitis   . Seizures   . Back pain   . ETOH abuse   . COPD (chronic obstructive pulmonary disease)   . SAH (subarachnoid hemorrhage)   . Macrocytosis 08/17/2011  . Bradycardia 08/17/2011  . Pneumonia     Medications:  Scheduled:    . sodium chloride   Intravenous STAT  . albuterol  2.5 mg Nebulization Q6H  . carbamazepine  200 mg Oral Q24H  . carbamazepine  400 mg Oral BID  . enoxaparin (LOVENOX) injection  40 mg Subcutaneous Q24H  . guaiFENesin  600 mg Oral BID  . [COMPLETED] ibuprofen  800 mg Oral Once  . ipratropium  0.5 mg Nebulization Q6H  . levETIRAcetam  500 mg Oral BID  . levofloxacin (LEVAQUIN) IV  750 mg Intravenous Q24H  . [COMPLETED] oxyCODONE-acetaminophen  1 tablet Oral Once  . [COMPLETED] oxyCODONE-acetaminophen  1 tablet Oral Once  . [COMPLETED]  piperacillin-tazobactam (ZOSYN)  IV  3.375 g Intravenous Once  . piperacillin-tazobactam (ZOSYN)  IV  3.375 g Intravenous Q8H  . potassium chloride  10 mEq Oral BID  . potassium chloride  10 mEq Intravenous Q1 Hr x 4  . [COMPLETED] sodium chloride  1,000 mL Intravenous Once  . [COMPLETED] vancomycin  1,000 mg Intravenous Once  . vancomycin  1,000 mg Intravenous Q12H   Assessment: 37yo male who was hospitalized for the same symptoms and discharged 4 days ago after threatening to leave AMA.  Pt got worse after discharge and is now re-admitted for pna.  Pt has good renal fxn.  Estimated Creatinine Clearance: 97.3 ml/min (by C-G formula based on Cr of 0.29).  Goal of Therapy:  Vancomycin trough level 15-20 mcg/ml  Plan: Vancomycin 1gm iv q12hrs Check trough at steady state Zosyn  3.375gm IV q8hrs (4hr infusion) Monitor labs, renal fxn, and cultures per protocol Duration of therapy per MD  Valrie Hart A 11/02/2012,1:30 PM

## 2012-11-02 NOTE — H&P (Signed)
[name] MRN: [id number] DOB/AGE: 1976-10-06 37 y.o. Primary Care Physician:ROY, MARK, MD Admit date: 11/02/2012 Chief Complaint: Shortness of breath and chest pain HPI: This is a 37 year old who was discharged from hospital 4 days ago with pneumonia. He had what appeared to be a cavitary pneumonia and it was thought to be secondary to aspiration although it was healthcare associated due to the fact that he had been in the hospital. He has a history of alcohol abuse and seizure disorder and it was thought that he probably had aspirated. A live discharge he was threatening to leave AGAINST MEDICAL ADVICE and I discharged him home on Augmentin to try to help him with his pneumonia. However he did not fill that prescription and has returned with the same symptoms. He has had fever and is complaining of chest pain. His coughing but not bringing in the sputum up. His past history is extensive and as listed below and he has a history of COPD chronic pain chronic alcohol abuse and a previous subarachnoid hemorrhage.  Past Medical History  Diagnosis Date  . Pancreatitis   . Seizures   . Back pain   . ETOH abuse   . COPD (chronic obstructive pulmonary disease)   . SAH (subarachnoid hemorrhage)   . Macrocytosis 08/17/2011  . Bradycardia 08/17/2011  . Pneumonia    Past Surgical History  Procedure Date  . Back surgery   . Septoplasty         Family History  Problem Relation Age of Onset  . Seizures Father   . Alcohol abuse Father   . Coronary artery disease Mother     deceased age 39    Social History:  reports that he has been smoking Cigarettes.  He has a 20 pack-year smoking history. He has never used smokeless tobacco. He reports that he drinks alcohol. He reports that he does not use illicit drugs.   Allergies: No Known Allergies  Medications Prior to Admission  Medication Sig Dispense Refill  . albuterol (PROVENTIL HFA;VENTOLIN HFA) 108 (90 BASE) MCG/ACT inhaler Inhale 1-2  puffs into the lungs every 6 (six) hours as needed for wheezing.  1 Inhaler  0  . amoxicillin-clavulanate (AUGMENTIN) 875-125 MG per tablet Take 1 tablet by mouth every 12 (twelve) hours.  20 tablet  0  . carbamazepine (TEGRETOL) 200 MG tablet Take 200-400 mg by mouth 3 (three) times daily. Take 2 tablets in the morning, 1 tablet at lunch and 2 tablets at bedtime.      . levETIRAcetam (KEPPRA) 500 MG tablet Take 500 mg by mouth 2 (two) times daily.       . ondansetron (ZOFRAN) 4 MG tablet Take 1 tablet (4 mg total) by mouth every 6 (six) hours.  12 tablet  0  . oxyCODONE-acetaminophen (TYLOX) 5-500 MG per capsule Take 1 capsule by mouth every 6 (six) hours as needed. For pain  30 capsule  0  . potassium chloride (K-DUR) 10 MEQ tablet Take 1 tablet (10 mEq total) by mouth 2 (two) times daily.  60 tablet  0  . traMADol (ULTRAM) 50 MG tablet Take 1 tablet (50 mg total) by mouth every 6 (six) hours as needed for pain.  30 tablet  0       NWG:NFAOZ from the symptoms mentioned above,there are no other symptoms referable to all systems reviewed.  Physical Exam: Blood pressure 125/63, pulse 96, temperature 98.2 F (36.8 C), temperature source Oral, resp. rate 20, height 5\' 9"  (1.753 m),  weight 54.432 kg (120 lb), SpO2 94.00%. He is awake and alert. He is somewhat sleepy from getting pain medication. He does not appear to be in acute distress. His pupils are reactive. His mucous membranes are slightly dry. His neck is supple without masses bruits or JVD. His chest shows rhonchi bilaterally left greater than right. His heart is regular without gallop. His abdomen is soft without masses. Extremities showed no edema. Central nervous system examination other than being sleepy is grossly intact    Basename 11/02/12 0911  WBC 22.3*  NEUTROABS 19.2*  HGB 11.9*  HCT 34.3*  MCV 106.5*  PLT 414*    Basename 11/02/12 0911  NA 126*  K 2.9*  CL 85*  CO2 29  GLUCOSE 101*  BUN <3*  CREATININE 0.29*    CALCIUM 8.7  MG --  lablast2(ast:2,ALT:2,alkphos:2,bilitot:2,prot:2,albumin:2)@    Recent Results (from the past 240 hour(s))  CULTURE, BLOOD (ROUTINE X 2)     Status: Normal   Collection Time   10/26/12  3:17 PM      Component Value Range Status Comment   Specimen Description RIGHT ANTECUBITAL   Final    Special Requests BOTTLES DRAWN AEROBIC AND ANAEROBIC 6CC   Final    Culture NO GROWTH 6 DAYS   Final    Report Status 11/01/2012 FINAL   Final   CULTURE, BLOOD (ROUTINE X 2)     Status: Normal   Collection Time   10/26/12  3:18 PM      Component Value Range Status Comment   Specimen Description LEFT ANTECUBITAL   Final    Special Requests BOTTLES DRAWN AEROBIC ONLY 6CC   Final    Culture NO GROWTH 6 DAYS   Final    Report Status 11/01/2012 FINAL   Final   CULTURE, BLOOD (ROUTINE X 2)     Status: Normal (Preliminary result)   Collection Time   11/02/12 10:33 AM      Component Value Range Status Comment   Specimen Description BLOOD LEFT HAND   Final    Special Requests BOTTLES DRAWN AEROBIC AND ANAEROBIC 4CC   Final    Culture PENDING   Incomplete    Report Status PENDING   Incomplete   CULTURE, BLOOD (ROUTINE X 2)     Status: Normal (Preliminary result)   Collection Time   11/02/12 10:50 AM      Component Value Range Status Comment   Specimen Description BLOOD RIGHT FOREARM   Final    Special Requests 4CC   Final    Culture PENDING   Incomplete    Report Status PENDING   Incomplete      Dg Chest 2 View  11/02/2012  *RADIOLOGY REPORT*  Clinical Data: Cough, chest pain, shortness of breath  CHEST - 2 VIEW  Comparison: CT 10/26/2012, chest radiograph 10/26/2012  Findings: Cavitary bilateral upper lobe airspace opacities are again noted with slightly increased cavitary component to the dominant left upper lobe consolidation.  Left lower lobe consolidation persists.  No pleural effusion.  Heart size is normal.  IMPRESSION: Increased aeration/cavitation in dominant left upper lobe  consolidation with persistent multilobar airspace opacities elsewhere.   Original Report Authenticated By: Christiana Pellant, M.D.    Dg Chest 2 View  10/26/2012  *RADIOLOGY REPORT*  Clinical Data: Pneumonia.  Cough.  CHEST - 2 VIEW  Comparison: 12/22 and 09/16/2012  Findings: There has been marked progression of the pneumonia in the left upper lobe now extending into the lingula.  There are now patchy infiltrates in the right upper lobe.  The consolidative infiltrate in the left lung apex is now cavitating.  No effusions.  No acute osseous abnormality.  There is an old compression fracture of the mid thoracic spine.  IMPRESSION: Marked progression of left upper lobe pneumonia.  New right upper lobe pneumonia.  The patient has developed multiple areas of cavitation in the areas of pneumonia.   Original Report Authenticated By: Francene Boyers, M.D.    Ct Angio Chest W/cm &/or Wo Cm  10/26/2012  *RADIOLOGY REPORT*  Clinical Data: Pneumonia and elevated D-dimer.  Fever, vomiting and dizziness.  CT ANGIOGRAPHY CHEST  Technique:  Multidetector CT imaging of the chest using the standard protocol during bolus administration of intravenous contrast. Multiplanar reconstructed images including MIPs were obtained and reviewed to evaluate the vascular anatomy.  Contrast: OMNIPAQUE IOHEXOL 350 MG/ML SOLN  Comparison: Chest radiograph 10/26/2012 and CT chest 12/27/2010.  Findings: Mediastinal lymph nodes measure up to 10 mm in the low right paratracheal station.  Bihilar lymph nodes measure up to 1.2 cm on the right.  No axillary adenopathy.  Atherosclerotic calcification of the arterial vasculature, including coronary arteries.  Heart size normal.  Left ventricle appears dilated.  No pericardial effusion.  There is patchy bilateral air space consolidation with many areas of associated cavitation.  Findings are worst in the left upper lobe.  Patchy areas of peribronchovascular nodularity and septal thickening are seen  as well.  Overall, the distribution appears peribronchovascular.  No pleural fluid.  Dependent debris is seen in the trachea and left mainstem bronchus.  Incidental imaging of the upper abdomen shows lymph nodes measuring up to 8 mm in the portacaval station.  No worrisome lytic or sclerotic lesions.  IMPRESSION:  1.  Multilobar airspace consolidation, predominantly cavitary in appearance.  Findings are worst in the left upper lobe.  Associated septal thickening and peribronchovascular nodularity bilaterally. Necrotizing pneumonia is favored.  Septic emboli cannot be excluded. 2.  Mediastinal and hilar adenopathy, likely reactive. 3.  Age advanced coronary artery calcification.   Original Report Authenticated By: Leanna Battles, M.D.    Dg Chest Portable 1 View  10/19/2012  *RADIOLOGY REPORT*  Clinical Data: Shortness of breath  PORTABLE CHEST - 1 VIEW  Comparison: 10/08/2012  Findings: Interval development of left upper lobe consolidation, in keeping with pneumonia.  No cavitation. There is background hyperinflation/interstitial coarsening.  Cardiomediastinal contours within normal limits. No pleural effusion or pneumothorax.  No acute osseous finding.  IMPRESSION: Left upper lobe pneumonia has developed in the interval. Recommend radiograph follow-up after therapy to document resolution.   Original Report Authenticated By: Jearld Lesch, M.D.    Dg Abd Acute W/chest  10/08/2012  *RADIOLOGY REPORT*  Clinical Data: Severe abdominal pain.  History of pancreatitis  ACUTE ABDOMEN SERIES (ABDOMEN 2 VIEW & CHEST 1 VIEW)  Comparison: 09/16/2012  Findings: COPD with pulmonary hyperinflation.  Negative for infiltrate or effusion.  No heart failure or mass lesion.  Normal bowel gas pattern.  No bowel obstruction or free air.  No renal calculi and no significant bony abnormality.  IMPRESSION: COPD.  No acute cardiopulmonary disease.  Negative abdomen.   Original Report Authenticated By: Janeece Riggers, M.D.     Impression: He has pneumonia. This is probably aspiration but will be treated his hospital or healthcare associated pneumonia because of his being in the hospital. He will be on 3 antibiotics for now. His sodium level was low which is a chronic  problem. His potassium is still low. Apparently the only prescription that he feels was 4 potassium. Active Problems:  * No active hospital problems. *      Plan: As mentioned he'll be treated with 3 antibiotics continue his IV fluids et Karie Soda he'll have runs of potassium and oral potassium.      Ardena Gangl L Pager [phone]  11/02/2012, 12:34 PM

## 2012-11-02 NOTE — Progress Notes (Signed)
Patient on Potassium IV as ordered, patient complained of IV burning. Potassium titrated down patient still could not tolerate. Dr. Juanetta Gosling made aware. New order to stop Potassium.

## 2012-11-02 NOTE — ED Notes (Signed)
Pt states he was d/c from hospital 5 days ago for 5 day stay for pneumonia. Arrived today with fever, chills, non productive cough.

## 2012-11-02 NOTE — ED Notes (Signed)
Recently d/c for PNA - reports increased cough and sob that started last night.

## 2012-11-02 NOTE — ED Provider Notes (Signed)
History    Scribed for Benny Lennert, MD, the patient was seen in room APA12/APA12. This chart was scribed by Katha Cabal.   CSN: [id number]  Arrival date & time 11/02/12  4098   First MD Initiated Contact with Patient 11/02/12 [phone]      Chief Complaint  Patient presents with  . Cough    (Consider location/radiation/quality/duration/timing/severity/associated sxs/prior Treatment)  Benny Lennert, MD entered patient's room at 9:06 AM    Patient is a 37 y.o. male presenting with cough.  Cough This is a recurrent problem. The current episode started more than 1 week ago. The problem occurs constantly. The problem has been gradually worsening. The cough is productive of sputum. The maximum temperature recorded prior to his arrival was 102 to 102.9 F. Associated symptoms include shortness of breath. Pertinent negatives include no chest pain and no headaches. Treatments tried: antibiotics. The treatment provided no relief. He is a smoker. His past medical history is significant for pneumonia and COPD.    Patient seen here for same about a week ago.  Patient states he was diagnosed with pneumonia and has taken antibiotics.  Patient smokes a pack of cigarettes a day.       PCP Dr. Janna Arch     s Past Medical History  Diagnosis Date  . Pancreatitis   . Seizures   . Back pain   . ETOH abuse   . COPD (chronic obstructive pulmonary disease)   . SAH (subarachnoid hemorrhage)   . Macrocytosis 08/17/2011  . Bradycardia 08/17/2011  . Pneumonia     Past Surgical History  Procedure Date  . Back surgery   . Septoplasty     Family History  Problem Relation Age of Onset  . Seizures Father   . Alcohol abuse Father   . Coronary artery disease Mother     deceased age 54    History  Substance Use Topics  . Smoking status: Current Every Day Smoker -- 1.0 packs/day for 20 years    Types: Cigarettes  . Smokeless tobacco: Never Used  . Alcohol Use: Yes     Comment:  occasionally      Review of Systems  Constitutional: Negative for fatigue.  HENT: Negative for congestion, sinus pressure and ear discharge.   Eyes: Negative for discharge.  Respiratory: Positive for cough and shortness of breath.   Cardiovascular: Negative for chest pain.  Gastrointestinal: Negative for abdominal pain and diarrhea.  Genitourinary: Negative for frequency and hematuria.  Musculoskeletal: Positive for back pain.  Skin: Negative for rash.  Neurological: Negative for seizures and headaches.  Hematological: Negative.   Psychiatric/Behavioral: Negative for hallucinations.    Allergies  Review of patient's allergies indicates no known allergies.  Home Medications   Current Outpatient Rx  Name  Route  Sig  Dispense  Refill  . ALBUTEROL SULFATE HFA 108 (90 BASE) MCG/ACT IN AERS   Inhalation   Inhale 1-2 puffs into the lungs every 6 (six) hours as needed for wheezing.   1 Inhaler   0   . AMOXICILLIN-POT CLAVULANATE 875-125 MG PO TABS   Oral   Take 1 tablet by mouth every 12 (twelve) hours.   20 tablet   0   . CARBAMAZEPINE 200 MG PO TABS   Oral   Take 200-400 mg by mouth 3 (three) times daily. Take 2 tablets in the morning, 1 tablet at lunch and 2 tablets at bedtime.         Marland Kitchen LEVETIRACETAM 500  MG PO TABS   Oral   Take 500 mg by mouth 2 (two) times daily.          Marland Kitchen ONDANSETRON HCL 4 MG PO TABS   Oral   Take 1 tablet (4 mg total) by mouth every 6 (six) hours.   12 tablet   0   . OXYCODONE-ACETAMINOPHEN 5-500 MG PO CAPS   Oral   Take 1 capsule by mouth every 6 (six) hours as needed. For pain   30 capsule   0   . POTASSIUM CHLORIDE ER 10 MEQ PO TBCR   Oral   Take 1 tablet (10 mEq total) by mouth 2 (two) times daily.   60 tablet   0   . TRAMADOL HCL 50 MG PO TABS   Oral   Take 1 tablet (50 mg total) by mouth every 6 (six) hours as needed for pain.   30 tablet   0     BP 158/78  Pulse 107  Temp 102.7 F (39.3 C) (Oral)  Resp 20  Ht  5\' 9"  (1.753 m)  Wt 120 lb (54.432 kg)  BMI 17.72 kg/m2  SpO2 95%  Physical Exam  Constitutional: He is oriented to person, place, and time. He appears well-developed.  HENT:  Head: Normocephalic and atraumatic.  Eyes: Conjunctivae normal and EOM are normal. No scleral icterus.  Neck: Neck supple. No thyromegaly present.  Cardiovascular: Normal rate and regular rhythm.  Exam reveals no gallop and no friction rub.   No murmur heard. Pulmonary/Chest: No stridor. He has no wheezes. He has rales (bilaterally). He exhibits no tenderness.  Abdominal: He exhibits no distension. There is no tenderness. There is no rebound.  Musculoskeletal: Normal range of motion. He exhibits no edema.  Lymphadenopathy:    He has no cervical adenopathy.  Neurological: He is oriented to person, place, and time. Coordination normal.  Skin: No rash noted. No erythema.  Psychiatric: He has a normal mood and affect. His behavior is normal.    ED Course  Procedures (including critical care time)    DIAGNOSTIC STUDIES: Oxygen Saturation is 95% on room air adquate by my interpretation.     COORDINATION OF CARE:  9:09 AM  Physical exam complete.  Patient is febrile.  Will review CXR and order breathing treatment.    9:11 AM  Ibuprofen, IV fluids and Percocet/roxicet ordered.   10:45 AM  Reviewed xray.  Discussed radiological findings with patient.   10:49 AM  Consultation call returned.  Spoke with Hospitalist, Dr. Juanetta Gosling, regarding patient radiological findings.  Plan to admit patient.          LABS / RADIOLOGY:   Labs Reviewed  CBC WITH DIFFERENTIAL - Abnormal; Notable for the following:    WBC 22.3 (*)     RBC 3.22 (*)     Hemoglobin 11.9 (*)     HCT 34.3 (*)     MCV 106.5 (*)     MCH 37.0 (*)     Platelets 414 (*)     Neutrophils Relative 86 (*)     Neutro Abs 19.2 (*)     Lymphocytes Relative 8 (*)     Monocytes Absolute 1.4 (*)     All other components within normal limits    COMPREHENSIVE METABOLIC PANEL - Abnormal; Notable for the following:    Sodium 126 (*)     Potassium 2.9 (*)     Chloride 85 (*)     Glucose, Bld 101 (*)  BUN <3 (*)     Creatinine, Ser 0.29 (*)     Albumin 2.4 (*)     Alkaline Phosphatase 147 (*)     All other components within normal limits  CULTURE, BLOOD (ROUTINE X 2)  CULTURE, BLOOD (ROUTINE X 2)  LACTIC ACID, PLASMA   Dg Chest 2 View  11/02/2012  *RADIOLOGY REPORT*  Clinical Data: Cough, chest pain, shortness of breath  CHEST - 2 VIEW  Comparison: CT 10/26/2012, chest radiograph 10/26/2012  Findings: Cavitary bilateral upper lobe airspace opacities are again noted with slightly increased cavitary component to the dominant left upper lobe consolidation.  Left lower lobe consolidation persists.  No pleural effusion.  Heart size is normal.  IMPRESSION: Increased aeration/cavitation in dominant left upper lobe consolidation with persistent multilobar airspace opacities elsewhere.   Original Report Authenticated By: Christiana Pellant, M.D.          MDM          MEDICATIONS GIVEN IN THE E.D. Scheduled Meds:   Continuous Infusions:    . piperacillin-tazobactam (ZOSYN)  IV    . vancomycin         IMPRESSION: No diagnosis found.      The chart was scribed for me under my direct supervision.  I personally performed the history, physical, and medical decision making and all procedures in the evaluation of this patient.Benny Lennert, MD 11/02/12 1054

## 2012-11-03 LAB — CBC WITH DIFFERENTIAL/PLATELET
Basophils Relative: 0 % (ref 0–1)
Eosinophils Absolute: 0 10*3/uL (ref 0.0–0.7)
HCT: 28.4 % — ABNORMAL LOW (ref 39.0–52.0)
Hemoglobin: 9.7 g/dL — ABNORMAL LOW (ref 13.0–17.0)
MCH: 36.3 pg — ABNORMAL HIGH (ref 26.0–34.0)
MCHC: 34.2 g/dL (ref 30.0–36.0)
MCV: 106.4 fL — ABNORMAL HIGH (ref 78.0–100.0)
Monocytes Absolute: 1.2 10*3/uL — ABNORMAL HIGH (ref 0.1–1.0)
Monocytes Relative: 8 % (ref 3–12)
Neutro Abs: 11.7 10*3/uL — ABNORMAL HIGH (ref 1.7–7.7)

## 2012-11-03 LAB — EXPECTORATED SPUTUM ASSESSMENT W GRAM STAIN, RFLX TO RESP C

## 2012-11-03 LAB — BASIC METABOLIC PANEL
BUN: 4 mg/dL — ABNORMAL LOW (ref 6–23)
CO2: 31 mEq/L (ref 19–32)
Chloride: 90 mEq/L — ABNORMAL LOW (ref 96–112)
Glucose, Bld: 114 mg/dL — ABNORMAL HIGH (ref 70–99)
Potassium: 2.8 mEq/L — ABNORMAL LOW (ref 3.5–5.1)

## 2012-11-03 MED ORDER — POTASSIUM CHLORIDE 10 MEQ/100ML IV SOLN
10.0000 meq | INTRAVENOUS | Status: AC
  Administered 2012-11-03 (×5): 10 meq via INTRAVENOUS
  Filled 2012-11-03: qty 500

## 2012-11-03 MED ORDER — PRO-STAT SUGAR FREE PO LIQD
30.0000 mL | Freq: Three times a day (TID) | ORAL | Status: DC
  Administered 2012-11-03 – 2012-11-10 (×19): 30 mL via ORAL
  Filled 2012-11-03 (×19): qty 30

## 2012-11-03 MED ORDER — HYDROCORTISONE 1 % EX CREA
TOPICAL_CREAM | Freq: Three times a day (TID) | CUTANEOUS | Status: DC | PRN
  Administered 2012-11-03: 1 via TOPICAL
  Filled 2012-11-03: qty 1.5

## 2012-11-03 MED ORDER — BOOST / RESOURCE BREEZE PO LIQD
1.0000 | Freq: Three times a day (TID) | ORAL | Status: DC
  Administered 2012-11-03 – 2012-11-09 (×17): 1 via ORAL

## 2012-11-03 NOTE — Progress Notes (Signed)
INITIAL NUTRITION ASSESSMENT  DOCUMENTATION CODES Per approved criteria  -Underweight -Severe malnutrition in the context of acute illness   INTERVENTION: Resource Breeze po TID, each supplement provides 250 kcal and 9 grams of protein. Add ProStat 30 ml TID provides 300 kcal, 45 gr protein daily   NUTRITION DIAGNOSIS: Inadequate oral intake related to estimated nutrition needs exceed intake as evidenced by severe wt loss, polysubstance abuse and pneumonia  Goal: Pt to meet >/= 90% of their estimated nutrition needs; not met  Monitor:  Meal and oral nutrition supplement intake, labs and wt trends  Reason for Assessment: Malnutrition Screen  37 y.o. male  Admitting Dx: HCAP, Hyponatriemia   ASSESSMENT: Pt recently assessed due to Malnutrition screen. He has hx of polysubstance abuse.  His wt hx reflects severe wt loss since previous admission 7%,9# (4 kg) in < 1 month, moderate depletion of body fat arms.He meets criteria for severe malnutrition given his >5% wt loss in 1 month and moderate depletion of body fat.  Height: Ht Readings from Last 1 Encounters:  11/02/12 5\' 9"  (1.753 m)    Weight: Wt Readings from Last 1 Encounters:  11/02/12 115 lb 15.4 oz (52.6 kg)    Ideal Body Weight: 160# (72.7 kg)  % Ideal Body Weight: 71%  Wt Readings from Last 10 Encounters:  11/02/12 115 lb 15.4 oz (52.6 kg)  10/26/12 114 lb 6.7 oz (51.9 kg)  10/19/12 125 lb (56.7 kg)  10/08/12 126 lb (57.153 kg)  09/23/12 120 lb (54.432 kg)  09/16/12 125 lb (56.7 kg)  08/13/12 120 lb (54.432 kg)  07/11/12 119 lb 15.9 oz (54.43 kg)  07/09/12 120 lb (54.432 kg)  07/09/12 120 lb (54.432 kg)    Usual Body Weight: 120# (54.4 kg)  % Usual Body Weight: 96%  BMI:  Body mass index is 17.12 kg/(m^2). Underweight   Estimated Nutritional Needs: Kcal: 1600-1900 Protein: 78-93 gr Fluid: 1 ml/kcal  Skin: no issues noted  Diet Order: Cardiac  EDUCATION NEEDS: -Education needs  addressed   Intake/Output Summary (Last 24 hours) at 11/03/12 1003 Last data filed at 11/02/12 2217  Gross per 24 hour  Intake    600 ml  Output      0 ml  Net    600 ml    Last BM: 11/03/12  Labs:   Lab 11/03/12 0455 11/02/12 0911 10/29/12 0538  NA 129* 126* 128*  K 2.8* 2.9* 2.9*  CL 90* 85* 90*  CO2 31 29 29   BUN 4* <3* 5*  CREATININE 0.30* 0.29* 0.30*  CALCIUM 8.1* 8.7 8.4  MG -- -- --  PHOS -- -- --  GLUCOSE 114* 101* 118*    CBG (last 3)  No results found for this basename: GLUCAP:3 in the last 72 hours  Scheduled Meds:    . sodium chloride   Intravenous STAT  . albuterol  2.5 mg Nebulization Q6H  . carbamazepine  200 mg Oral Q24H  . carbamazepine  400 mg Oral BID  . enoxaparin (LOVENOX) injection  40 mg Subcutaneous Q24H  . guaiFENesin  600 mg Oral BID  . ipratropium  0.5 mg Nebulization Q6H  . levETIRAcetam  500 mg Oral BID  . levofloxacin (LEVAQUIN) IV  750 mg Intravenous Q24H  . piperacillin-tazobactam (ZOSYN)  IV  3.375 g Intravenous Q8H  . potassium chloride  10 mEq Oral BID  . potassium chloride  10 mEq Intravenous Q1 Hr x 5  . vancomycin  1,000 mg Intravenous Q12H  Continuous Infusions:    . sodium chloride 125 mL/hr at 11/02/12 1419    Past Medical History  Diagnosis Date  . Pancreatitis   . Seizures   . Back pain   . ETOH abuse   . COPD (chronic obstructive pulmonary disease)   . SAH (subarachnoid hemorrhage)   . Macrocytosis 08/17/2011  . Bradycardia 08/17/2011  . Pneumonia     Past Surgical History  Procedure Date  . Back surgery   . Septoplasty     [phone]

## 2012-11-03 NOTE — Progress Notes (Signed)
[name], [name] NO.:  [id number]  MEDICAL RECORD NO.:  [id number]  LOCATION:                                 FACILITY:  PHYSICIAN:  Melvyn Novas, MDDATE OF BIRTH:  05/06/1976  DATE OF PROCEDURE: DATE OF DISCHARGE:                                PROGRESS NOTE   The patient pressured physician who is willing to sign out AMA at home for several days.  Do not take Augmentin antibiotic now back with his cavitary left upper lobe pneumonia, currently replaced on 3 antibiotics vanc, Zosyn, as well as Levaquin.  She has a history of ethanol abuse, pancreatitis, polysubstance abuse, subarachnoid hemorrhage, COPD, macrocytosis on arrival with a white count of 22,000 currently down to 14,000, hemoglobin 9.7, potassium 2.8, creatinine 0.3, on seizure medicines Tegretol and Keppra, blood pressure 118/65, temperature 98, pulse 77 and regular, respiratory rate is 20.  Lungs show coarse rhonchi bilaterally with prolonged inspiratory and expiratory phase.  Diminished breath sounds at the bases.  Mild end-expiratory wheeze noted.  Heart regular rhythm, no S3-S4 appreciated.  PLAN:  Right now is to continue triple antibiotic coverage showed aggressive nebulizer therapy, as per Pulmonary.  Monitor white count. Replenish potassium intravenously and orally.  Sodium improved to 129.     Melvyn Novas, MD     RMD/MEDQ  D:  11/03/2012  T:  11/03/2012  Job:  [id number]

## 2012-11-03 NOTE — Progress Notes (Signed)
[id number] 

## 2012-11-03 NOTE — Care Management Note (Signed)
    Page 1 of 1   11/10/2012     1:34:04 PM   CARE MANAGEMENT NOTE 11/10/2012  Patient:  [name], [name]   Account Number:  [id number]  Date Initiated:  11/03/2012  Documentation initiated by:  Sharrie Rothman  Subjective/Objective Assessment:   Pt admitted from home with pneumonia. Pt lives with a friend and will return home at discharge. Pt is independent with ADL's. Pt has a history of noncompliance.     Action/Plan:   No CM or HH needs noted.   Anticipated DC Date:  11/06/2012   Anticipated DC Plan:  HOME/SELF CARE      DC Planning Services  CM consult      Choice offered to / List presented to:             Status of service:  Completed, signed off Medicare Important Message given?   (If response is "NO", the following Medicare IM given date fields will be blank) Date Medicare IM given:   Date Additional Medicare IM given:    Discharge Disposition:  HOME/SELF CARE  Per UR Regulation:    If discussed at Long Length of Stay Meetings, dates discussed:    Comments:  11/10/12 1333 Arlyss Queen, RN BSN CM Pt discharged home today. No CM or HH needs noted.  11/03/12 1115 Arlyss Queen, RN BSN CM

## 2012-11-03 NOTE — Progress Notes (Signed)
UR chart review completed.  

## 2012-11-04 LAB — DIFFERENTIAL
Basophils Absolute: 0 10*3/uL (ref 0.0–0.1)
Basophils Relative: 0 % (ref 0–1)
Monocytes Relative: 10 % (ref 3–12)
Neutro Abs: 10.6 10*3/uL — ABNORMAL HIGH (ref 1.7–7.7)
Neutrophils Relative %: 79 % — ABNORMAL HIGH (ref 43–77)

## 2012-11-04 LAB — CBC
Hemoglobin: 9.9 g/dL — ABNORMAL LOW (ref 13.0–17.0)
RBC: 2.68 MIL/uL — ABNORMAL LOW (ref 4.22–5.81)
WBC: 13.5 10*3/uL — ABNORMAL HIGH (ref 4.0–10.5)

## 2012-11-04 LAB — BASIC METABOLIC PANEL
CO2: 26 mEq/L (ref 19–32)
Calcium: 8.3 mg/dL — ABNORMAL LOW (ref 8.4–10.5)
GFR calc non Af Amer: >90 mL/min (ref >90)
Sodium: 126 mEq/L — ABNORMAL LOW (ref 135–145)

## 2012-11-04 LAB — VANCOMYCIN, TROUGH: Vancomycin Tr: <5 ug/mL — ABNORMAL LOW (ref 10.0–20.0)

## 2012-11-04 MED ORDER — PAROXETINE HCL 20 MG PO TABS
20.0000 mg | ORAL_TABLET | Freq: Every day | ORAL | Status: DC
  Administered 2012-11-04 – 2012-11-10 (×7): 20 mg via ORAL
  Filled 2012-11-04 (×7): qty 1

## 2012-11-04 MED ORDER — VANCOMYCIN HCL IN DEXTROSE 1-5 GM/200ML-% IV SOLN
1000.0000 mg | Freq: Three times a day (TID) | INTRAVENOUS | Status: DC
  Administered 2012-11-04 – 2012-11-06 (×5): 1000 mg via INTRAVENOUS
  Filled 2012-11-04 (×6): qty 200

## 2012-11-04 NOTE — Progress Notes (Signed)
Patient received 0.5mg  of dilaudid every two hrs for 12 hrs.  Also received oxy ir 5mg  every 4hrs.  Unable to get pain under control. Will notify Dr. Juanetta Gosling.

## 2012-11-04 NOTE — Progress Notes (Signed)
[id number] 

## 2012-11-04 NOTE — Progress Notes (Signed)
[name], [name]              ACCOUNT NO.:  [id number]  MEDICAL RECORD NO.:  [id number]  LOCATION:  A312                          FACILITY:  [facility]  PHYSICIAN:  Melvyn Novas, MDDATE OF BIRTH:  Apr 14, 1976  DATE OF PROCEDURE: DATE OF DISCHARGE:                                PROGRESS NOTE   The patient has left upper lobe consolidation and cavitation, presumed pneumonia, possible aspiration, possible hospital-acquired as he was in the hospital 2 weeks prior for unrelated illness, has polysubstance abuse, ethanol history, pancreatitis, chronic low back pain, status post diskectomy with lumbar radicular pain, has anemia, currently controlled on 3 antibiotics, Levaquin, Zosyn and vancomycin.  The patient was noncompliant with antibiotics for 4 days as he left hospital AMA.  He also has seizure disorder.  This was controlled.  Blood pressure 127/63, temperature 97.8, pulse is 80 and regular, respiratory rate is 20.  WBCs down to 13.5, hemoglobin 9.9, stable. Lungs show scattered coarse rhonchi and inspiratory and expiratory wheezes.  Mild diminished breath sounds at the bases.  Heart regular rhythm.  No murmurs, gallops, heaves, thrills, or rubs.  Planright now is to continue triple antibiotics to improve cavitation and consolidation of left upper lobe.  CT angio was negative for PE. Continue Dilaudid 0.5 q.2 h., potassium chloride, and seizure medicines.     Melvyn Novas, MD     RMD/MEDQ  D:  11/04/2012  T:  11/04/2012  Job:  [id number]

## 2012-11-04 NOTE — Progress Notes (Signed)
ANTIBIOTIC CONSULT NOTE - INITIAL  Pharmacy Consult for Vancomycin, Levaquin and Zosyn Indication: pneumonia  No Known Allergies  Patient Measurements: Height: 5\' 9"  (175.3 cm) Weight: 115 lb 15.4 oz (52.6 kg) IBW/kg (Calculated) : 70.7   Vital Signs: Temp: 97.8 F (36.6 C) (01/07 0534) Temp src: Oral (01/07 0534) BP: 127/63 mmHg (01/07 0534) Pulse Rate: 88  (01/07 0534) Intake/Output from previous day: 01/06 0701 - 01/07 0700 In: 1656.3 [I.V.:1356.3; IV Piggyback:300] Out: -  Intake/Output from this shift:    Labs:  Basename 11/04/12 0518 11/03/12 0455 11/02/12 0911  WBC 13.5* 14.4* 22.3*  HGB 9.9* 9.7* 11.9*  PLT 389 400 414*  LABCREA -- -- --  CREATININE 0.30* 0.30* 0.29*   Estimated Creatinine Clearance: 94.1 ml/min (by C-G formula based on Cr of 0.3).  Basename 11/04/12 0905  VANCOTROUGH <5.0*  VANCOPEAK --  Drue Dun --  Janeece Agee --  GENTPEAK --  GENTRANDOM --  TOBRATROUGH --  TOBRAPEAK --  TOBRARND --  AMIKACINPEAK --  AMIKACINTROU --  AMIKACIN --     Microbiology: Recent Results (from the past 720 hour(s))  CULTURE, BLOOD (ROUTINE X 2)     Status: Normal   Collection Time   10/26/12  3:17 PM      Component Value Range Status Comment   Specimen Description RIGHT ANTECUBITAL   Final    Special Requests BOTTLES DRAWN AEROBIC AND ANAEROBIC 6CC   Final    Culture NO GROWTH 6 DAYS   Final    Report Status 11/01/2012 FINAL   Final   CULTURE, BLOOD (ROUTINE X 2)     Status: Normal   Collection Time   10/26/12  3:18 PM      Component Value Range Status Comment   Specimen Description LEFT ANTECUBITAL   Final    Special Requests BOTTLES DRAWN AEROBIC ONLY 6CC   Final    Culture NO GROWTH 6 DAYS   Final    Report Status 11/01/2012 FINAL   Final   CULTURE, BLOOD (ROUTINE X 2)     Status: Normal (Preliminary result)   Collection Time   11/02/12 10:33 AM      Component Value Range Status Comment   Specimen Description BLOOD LEFT HAND   Final    Special Requests BOTTLES DRAWN AEROBIC AND ANAEROBIC 4CC   Final    Culture NO GROWTH 2 DAYS   Final    Report Status PENDING   Incomplete   CULTURE, BLOOD (ROUTINE X 2)     Status: Normal (Preliminary result)   Collection Time   11/02/12 10:50 AM      Component Value Range Status Comment   Specimen Description BLOOD RIGHT FOREARM   Final    Special Requests BOTTLES DRAWN AEROBIC AND ANAEROBIC 4CC   Final    Culture NO GROWTH 2 DAYS   Final    Report Status PENDING   Incomplete   CULTURE, EXPECTORATED SPUTUM-ASSESSMENT     Status: Normal   Collection Time   11/02/12  2:13 PM      Component Value Range Status Comment   Specimen Description SPUTUM   Final    Special Requests Normal   Final    Sputum evaluation     Final    Value: THIS SPECIMEN IS ACCEPTABLE. RESPIRATORY CULTURE REPORT TO FOLLOW. SMEAR REVIEWED 11/03/2012 BY BAUGHAM,M. CORRECTED ON 01/06 AT 1229: PREVIOUSLY REPORTED AS MICROSCOPIC FINDINGS SUGGEST THAT THIS SPECIMEN IS NOT REPRESENTATIVE OF LOWER RESPIRATORY      SECRETIONS.  PLEASE RECOLLECT.     Gram Stain Report Called to,Read Back By and Verified With: KNIGHT, C. AT 15:55PM ON 11/02/12 BY PRUITT, C.   Report Status 11/03/2012 FINAL   Final   CULTURE, RESPIRATORY     Status: Normal (Preliminary result)   Collection Time   11/02/12  2:13 PM      Component Value Range Status Comment   Specimen Description SPUTUM EXPECTORATED   Final    Special Requests NONE   Final    Gram Stain     Final    Value: ABUNDANT WBC PRESENT,BOTH PMN AND MONONUCLEAR     RARE SQUAMOUS EPITHELIAL CELLS PRESENT     MODERATE GRAM POSITIVE RODS     MODERATE GRAM POSITIVE COCCI IN CLUSTERS     IRP   Culture Culture reincubated for better growth   Final    Report Status PENDING   Incomplete     Medical History: Past Medical History  Diagnosis Date  . Pancreatitis   . Seizures   . Back pain   . ETOH abuse   . COPD (chronic obstructive pulmonary disease)   . SAH (subarachnoid hemorrhage)   .  Macrocytosis 08/17/2011  . Bradycardia 08/17/2011  . Pneumonia     Medications:  Scheduled:     . albuterol  2.5 mg Nebulization Q6H  . carbamazepine  200 mg Oral Q24H  . carbamazepine  400 mg Oral BID  . enoxaparin (LOVENOX) injection  40 mg Subcutaneous Q24H  . feeding supplement  30 mL Oral TID WC  . feeding supplement  1 Container Oral TID BM  . guaiFENesin  600 mg Oral BID  . ipratropium  0.5 mg Nebulization Q6H  . levETIRAcetam  500 mg Oral BID  . levofloxacin (LEVAQUIN) IV  750 mg Intravenous Q24H  . PARoxetine  20 mg Oral Daily  . piperacillin-tazobactam (ZOSYN)  IV  3.375 g Intravenous Q8H  . potassium chloride  10 mEq Oral BID  . [COMPLETED] potassium chloride  10 mEq Intravenous Q1 Hr x 5  . vancomycin  1,000 mg Intravenous Q8H  . [DISCONTINUED] vancomycin  1,000 mg Intravenous Q12H   Assessment: 37yo male who was hospitalized for the same symptoms and discharged 4 days ago after threatening to leave AMA.  Pt got worse after discharge and is now re-admitted for pna.  He is currently on day#3 broad spectrum antibiotics with Vancomycin, Levaquin & Zosyn.  Pt has good renal fxn.  Vancomycin trough level was undetectable today.  Goal of Therapy:  Vancomycin trough level 15-20 mcg/ml  Plan: Increase Vancomycin 1gm IV q8hrs Recheck trough at steady state Continue Zosyn 3.375gm IV q8hrs (4hr infusion) & Levaquin 750mg  IV Q24h Monitor labs, renal fxn, and cultures per protocol Duration of therapy per MD  Elson Clan 11/04/2012,1:40 PM

## 2012-11-05 LAB — BASIC METABOLIC PANEL
CO2: 25 mEq/L (ref 19–32)
Chloride: 89 mEq/L — ABNORMAL LOW (ref 96–112)
Potassium: 3.8 mEq/L (ref 3.5–5.1)
Sodium: 123 mEq/L — ABNORMAL LOW (ref 135–145)

## 2012-11-05 LAB — CBC WITH DIFFERENTIAL/PLATELET
Eosinophils Relative: 1 % (ref 0–5)
HCT: 30 % — ABNORMAL LOW (ref 39.0–52.0)
Lymphocytes Relative: 9 % — ABNORMAL LOW (ref 12–46)
Lymphs Abs: 1.5 10*3/uL (ref 0.7–4.0)
MCV: 106.8 fL — ABNORMAL HIGH (ref 78.0–100.0)
Monocytes Absolute: 1.7 10*3/uL — ABNORMAL HIGH (ref 0.1–1.0)
Neutro Abs: 12.7 10*3/uL — ABNORMAL HIGH (ref 1.7–7.7)
RBC: 2.81 MIL/uL — ABNORMAL LOW (ref 4.22–5.81)
WBC: 16 10*3/uL — ABNORMAL HIGH (ref 4.0–10.5)

## 2012-11-05 NOTE — Progress Notes (Signed)
[id number] 

## 2012-11-06 ENCOUNTER — Inpatient Hospital Stay (HOSPITAL_COMMUNITY): Payer: Medicaid Other

## 2012-11-06 LAB — CULTURE, RESPIRATORY W GRAM STAIN

## 2012-11-06 LAB — BASIC METABOLIC PANEL
CO2: 28 mEq/L (ref 19–32)
Calcium: 8.5 mg/dL (ref 8.4–10.5)
Chloride: 86 mEq/L — ABNORMAL LOW (ref 96–112)
Creatinine, Ser: 0.3 mg/dL — ABNORMAL LOW (ref 0.50–1.35)
Glucose, Bld: 130 mg/dL — ABNORMAL HIGH (ref 70–99)
Sodium: 121 mEq/L — ABNORMAL LOW (ref 135–145)

## 2012-11-06 MED ORDER — VANCOMYCIN HCL 10 G IV SOLR
1250.0000 mg | Freq: Three times a day (TID) | INTRAVENOUS | Status: DC
  Administered 2012-11-06 – 2012-11-07 (×5): 1250 mg via INTRAVENOUS
  Filled 2012-11-06 (×13): qty 1250

## 2012-11-06 NOTE — Progress Notes (Signed)
[name], [name] NO.:  [id number]  MEDICAL RECORD NO.:  [id number]  LOCATION:  A312                          FACILITY:  [facility]  PHYSICIAN:  Melvyn Novas, MDDATE OF BIRTH:  [birth year]  DATE OF PROCEDURE: DATE OF DISCHARGE:                                PROGRESS NOTE   SUBJECTIVE:  The patient has left lower lobe consolidation and cavitation, pneumonia, possible aspiration, possible hospital-acquired pneumonia, chronic polysubstance abuse, ethanol history, history of pancreatitis several weeks ago, chronic low back pain, status post a diskectomy with chronic lumbar radicular pain, has anemia currently stable.  The patient readmitted with 3 antibiotics on Levaquin, Zosyn, and vancomycin.  After he was noncompliant with antibiotics after leaving the hospital AMA.  OBJECTIVE:  Blood pressure 124/72, temperature 98.5, pulse 84 and regular, respiratory rate is 19 O2 sat is 91%.  Lungs show prolonged inspiratory and expiratory phase, scattered rhonchi.  No rales audible. Heart regular rhythm.  No S3, S4.  No heaves, thrills, or rubs.  Abdomen is soft, nontender.  Bowel sounds normoactive.  WBCs 16 hemoglobin 10.2, platelets are normal.  Creatinine 3.32.  PLAN:  The plan right now is to continue 3 antibiotics and nebulizer therapy, pulmonary toilet.  We will assess improvement or change in left upper lobe lesion.  The x-ray or CT per Pulmonary and length of antibiotics will be determined by Pulmonary.  The patient little less agitated on Paxil given started yesterday on continue vancomycin and Zosyn, as well as IV Levaquin.     Melvyn Novas, MD     RMD/MEDQ  D:  11/05/2012  T:  11/06/2012  Job:  [id number]

## 2012-11-06 NOTE — Progress Notes (Signed)
[id number] 

## 2012-11-06 NOTE — Progress Notes (Signed)
CRITICAL VALUE ALERT  Critical value received:  MRSA in sputum culture  Date of notification:  11-06-12  Time of notification:  0900  Critical value read back:yes  Nurse who received alert:  Sherrye Payor, RN  MD notified (1st page):  [name]  Time of first page:  0915  MD notified (2nd page):  Time of second page:  Responding MD:  Janna Arch  Time MD responded:  1100

## 2012-11-06 NOTE — Progress Notes (Signed)
ANTIBIOTIC CONSULT NOTE - INITIAL  Pharmacy Consult for Vancomycin, Levaquin and Zosyn Indication: pneumonia  No Known Allergies  Patient Measurements: Height: 5\' 9"  (175.3 cm) Weight: 115 lb 15.4 oz (52.6 kg) IBW/kg (Calculated) : 70.7   Vital Signs: Temp: 99.5 F (37.5 C) (01/08 2232) Temp src: Oral (01/08 2232) BP: 122/66 mmHg (01/08 2232) Pulse Rate: 89  (01/08 2232) Intake/Output from previous day: 01/08 0701 - 01/09 0700 In: 1210 [P.O.:360; I.V.:250; IV Piggyback:600] Out: -  Intake/Output from this shift:    Labs:  Basename 11/06/12 0622 11/05/12 0443 11/04/12 0518  WBC -- 16.0* 13.5*  HGB -- 10.2* 9.9*  PLT -- 427* 389  LABCREA -- -- --  CREATININE 0.30* 0.32* 0.30*   Estimated Creatinine Clearance: 94.1 ml/min (by C-G formula based on Cr of 0.3).  Basename 11/06/12 0625 11/04/12 0905  VANCOTROUGH 8.1* <5.0*  VANCOPEAK -- --  Drue Dun -- --  GENTTROUGH -- --  GENTPEAK -- --  GENTRANDOM -- --  TOBRATROUGH -- --  TOBRAPEAK -- --  TOBRARND -- --  AMIKACINPEAK -- --  AMIKACINTROU -- --  AMIKACIN -- --    Microbiology: Recent Results (from the past 720 hour(s))  CULTURE, BLOOD (ROUTINE X 2)     Status: Normal   Collection Time   10/26/12  3:17 PM      Component Value Range Status Comment   Specimen Description RIGHT ANTECUBITAL   Final    Special Requests BOTTLES DRAWN AEROBIC AND ANAEROBIC 6CC   Final    Culture NO GROWTH 6 DAYS   Final    Report Status 11/01/2012 FINAL   Final   CULTURE, BLOOD (ROUTINE X 2)     Status: Normal   Collection Time   10/26/12  3:18 PM      Component Value Range Status Comment   Specimen Description LEFT ANTECUBITAL   Final    Special Requests BOTTLES DRAWN AEROBIC ONLY 6CC   Final    Culture NO GROWTH 6 DAYS   Final    Report Status 11/01/2012 FINAL   Final   CULTURE, BLOOD (ROUTINE X 2)     Status: Normal (Preliminary result)   Collection Time   11/02/12 10:33 AM      Component Value Range Status Comment   Specimen Description BLOOD LEFT HAND   Final    Special Requests BOTTLES DRAWN AEROBIC AND ANAEROBIC 4CC   Final    Culture NO GROWTH 3 DAYS   Final    Report Status PENDING   Incomplete   CULTURE, BLOOD (ROUTINE X 2)     Status: Normal (Preliminary result)   Collection Time   11/02/12 10:50 AM      Component Value Range Status Comment   Specimen Description BLOOD RIGHT FOREARM   Final    Special Requests BOTTLES DRAWN AEROBIC AND ANAEROBIC 4CC   Final    Culture NO GROWTH 3 DAYS   Final    Report Status PENDING   Incomplete   CULTURE, EXPECTORATED SPUTUM-ASSESSMENT     Status: Normal   Collection Time   11/02/12  2:13 PM      Component Value Range Status Comment   Specimen Description SPUTUM   Final    Special Requests Normal   Final    Sputum evaluation     Final    Value: THIS SPECIMEN IS ACCEPTABLE. RESPIRATORY CULTURE REPORT TO FOLLOW. SMEAR REVIEWED 11/03/2012 BY BAUGHAM,M. CORRECTED ON 01/06 AT 1229: PREVIOUSLY REPORTED AS MICROSCOPIC FINDINGS SUGGEST THAT  THIS SPECIMEN IS NOT REPRESENTATIVE OF LOWER RESPIRATORY      SECRETIONS. PLEASE RECOLLECT.     Gram Stain Report Called to,Read Back By and Verified With: KNIGHT, C. AT 15:55PM ON 11/02/12 BY PRUITT, C.   Report Status 11/03/2012 FINAL   Final   CULTURE, RESPIRATORY     Status: Normal   Collection Time   11/02/12  2:13 PM      Component Value Range Status Comment   Specimen Description SPUTUM EXPECTORATED   Final    Special Requests NONE   Final    Gram Stain     Final    Value: ABUNDANT WBC PRESENT,BOTH PMN AND MONONUCLEAR     RARE SQUAMOUS EPITHELIAL CELLS PRESENT     MODERATE GRAM POSITIVE RODS     MODERATE GRAM POSITIVE COCCI IN CLUSTERS     IRP   Culture     Final    Value: ABUNDANT METHICILLIN RESISTANT STAPHYLOCOCCUS AUREUS     Note: RIFAMPIN AND GENTAMICIN SHOULD NOT BE USED AS SINGLE DRUGS FOR TREATMENT OF STAPH INFECTIONS. This organism DOES NOT demonstrate inducible Clindamycin resistance in vitro. CRITICAL RESULT  CALLED TO, READ BACK BY AND VERIFIED WITH: MEGAN B@7 :42AM ON      11/06/12 BY DANTS   Report Status 11/06/2012 FINAL   Final    Organism ID, Bacteria METHICILLIN RESISTANT STAPHYLOCOCCUS AUREUS   Final    Medical History: Past Medical History  Diagnosis Date  . Pancreatitis   . Seizures   . Back pain   . ETOH abuse   . COPD (chronic obstructive pulmonary disease)   . SAH (subarachnoid hemorrhage)   . Macrocytosis 08/17/2011  . Bradycardia 08/17/2011  . Pneumonia    Medications:  Scheduled:     . albuterol  2.5 mg Nebulization Q6H  . carbamazepine  200 mg Oral Q24H  . carbamazepine  400 mg Oral BID  . enoxaparin (LOVENOX) injection  40 mg Subcutaneous Q24H  . feeding supplement  30 mL Oral TID WC  . feeding supplement  1 Container Oral TID BM  . guaiFENesin  600 mg Oral BID  . ipratropium  0.5 mg Nebulization Q6H  . levETIRAcetam  500 mg Oral BID  . levofloxacin (LEVAQUIN) IV  750 mg Intravenous Q24H  . PARoxetine  20 mg Oral Daily  . piperacillin-tazobactam (ZOSYN)  IV  3.375 g Intravenous Q8H  . potassium chloride  10 mEq Oral BID  . vancomycin  1,000 mg Intravenous Q8H   Assessment: 37yo male who was hospitalized for the same symptoms and discharged 4 days ago after threatening to leave AMA.  Pt got worse after discharge and is now re-admitted for pna.  He has LLL consolidation and cavitation, pna, and possible aspiration.  He is currently on  broad spectrum antibiotics with Vancomycin, Levaquin & Zosyn. Pt has good renal fxn. Vancomycin trough level was still below goal today despite dose increase to 1gm IV q8hrs.  Pt has excellent Vancomycin clearance.    Goal of Therapy:  Vancomycin trough level 15-20 mcg/ml  Plan: Increase Vancomycin to 1250mg  IV q8hrs Recheck trough when appropriate Continue Zosyn 3.375gm IV q8hrs (4hr infusion) & Levaquin 750mg  IV Q24h Monitor labs, renal fxn, and cultures per protocol Duration of therapy per MD  Valrie Hart A 11/06/2012,8:25  AM

## 2012-11-06 NOTE — Plan of Care (Signed)
Problem: Phase II Progression Outcomes Goal: Review culture results with MD if needed Outcome: Completed/Met Date Met:  11/06/12 Sputum Culture positive for MRSA - Md aware

## 2012-11-07 LAB — CULTURE, BLOOD (ROUTINE X 2)

## 2012-11-07 LAB — CBC WITH DIFFERENTIAL/PLATELET
Basophils Absolute: 0 10*3/uL (ref 0.0–0.1)
Eosinophils Relative: 2 % (ref 0–5)
HCT: 26.9 % — ABNORMAL LOW (ref 39.0–52.0)
Hemoglobin: 9.1 g/dL — ABNORMAL LOW (ref 13.0–17.0)
Lymphocytes Relative: 13 % (ref 12–46)
Lymphs Abs: 1.5 10*3/uL (ref 0.7–4.0)
MCV: 106.7 fL — ABNORMAL HIGH (ref 78.0–100.0)
Monocytes Absolute: 1.4 10*3/uL — ABNORMAL HIGH (ref 0.1–1.0)
Monocytes Relative: 12 % (ref 3–12)
Neutro Abs: 8.3 10*3/uL — ABNORMAL HIGH (ref 1.7–7.7)
RDW: 14.2 % (ref 11.5–15.5)
WBC: 11.5 10*3/uL — ABNORMAL HIGH (ref 4.0–10.5)

## 2012-11-07 MED ORDER — HYDROMORPHONE HCL PF 1 MG/ML IJ SOLN
1.0000 mg | INTRAMUSCULAR | Status: DC | PRN
  Administered 2012-11-07 – 2012-11-10 (×21): 1 mg via INTRAVENOUS
  Filled 2012-11-07 (×21): qty 1

## 2012-11-07 MED ORDER — LEVOFLOXACIN IN D5W 750 MG/150ML IV SOLN
750.0000 mg | INTRAVENOUS | Status: DC
  Administered 2012-11-07 – 2012-11-09 (×3): 750 mg via INTRAVENOUS
  Filled 2012-11-07 (×4): qty 150

## 2012-11-07 MED ORDER — VANCOMYCIN HCL 10 G IV SOLR
2000.0000 mg | Freq: Three times a day (TID) | INTRAVENOUS | Status: DC
  Administered 2012-11-08 – 2012-11-10 (×8): 2000 mg via INTRAVENOUS
  Filled 2012-11-07 (×11): qty 2000

## 2012-11-07 NOTE — Progress Notes (Signed)
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  The patient is chronically noncompliant.  OBJECTIVE:  Blood pressure is 128/62, temperature 98.2, pulse 82 and regular, respiratory rate is 18.  WBCs were 16, hemoglobin 10.2. Hyponatremia, went down to 121.  Creatinine 0.30 potassium 3.7.  Lungs show prolonged inspiratory and expiratory phase.  Mild end-expiratory wheeze.  Scattered coarse rhonchi, no rales audible.  Heart regular rhythm.  No S3, S4.  No heaves, thrills, or rubs.  PLAN:  Plan right now is to continue 3 antibiotics.  I will check chest x-ray for possible progress or resolution of left upper lobe cavitation and consolidation.  Obtain CBC and BMET in a.m.  Not certain but the endpoint is for triple antibiotic therapy.  We will discuss this with Pulmonary.     Melvyn Novas, MD     RMD/MEDQ  D:  11/06/2012  T:  11/07/2012  Job:  [id number]

## 2012-11-07 NOTE — Progress Notes (Signed)
[id number] 

## 2012-11-07 NOTE — Progress Notes (Signed)
ANTIBIOTIC CONSULT NOTE - INITIAL  Pharmacy Consult for Vancomycin, Levaquin and Zosyn Indication: pneumonia  No Known Allergies  Patient Measurements: Height: 5\' 9"  (175.3 cm) Weight: 115 lb 15.4 oz (52.6 kg) IBW/kg (Calculated) : 70.7   Vital Signs: Temp: 98.7 F (37.1 C) (01/10 1500) Temp src: Oral (01/10 1500) BP: 110/73 mmHg (01/10 1500) Pulse Rate: 85  (01/10 1500) Intake/Output from previous day: 01/09 0701 - 01/10 0700 In: 5585 [P.O.:960; I.V.:4625] Out: -  Intake/Output from this shift: Total I/O In: 240 [P.O.:240] Out: -   Labs:  Basename 11/07/12 0604 11/06/12 0622 11/05/12 0443  WBC 11.5* -- 16.0*  HGB 9.1* -- 10.2*  PLT 400 -- 427*  LABCREA -- -- --  CREATININE -- 0.30* 0.32*   Estimated Creatinine Clearance: 94.1 ml/min (by C-G formula based on Cr of 0.3).  Basename 11/07/12 1601 11/06/12 0625  VANCOTROUGH 10.1 8.1*  VANCOPEAK -- --  Drue Dun -- --  GENTTROUGH -- --  GENTPEAK -- --  GENTRANDOM -- --  TOBRATROUGH -- --  TOBRAPEAK -- --  TOBRARND -- --  AMIKACINPEAK -- --  AMIKACINTROU -- --  AMIKACIN -- --    Microbiology: Recent Results (from the past 720 hour(s))  CULTURE, BLOOD (ROUTINE X 2)     Status: Normal   Collection Time   10/26/12  3:17 PM      Component Value Range Status Comment   Specimen Description RIGHT ANTECUBITAL   Final    Special Requests BOTTLES DRAWN AEROBIC AND ANAEROBIC 6CC   Final    Culture NO GROWTH 6 DAYS   Final    Report Status 11/01/2012 FINAL   Final   CULTURE, BLOOD (ROUTINE X 2)     Status: Normal   Collection Time   10/26/12  3:18 PM      Component Value Range Status Comment   Specimen Description LEFT ANTECUBITAL   Final    Special Requests BOTTLES DRAWN AEROBIC ONLY 6CC   Final    Culture NO GROWTH 6 DAYS   Final    Report Status 11/01/2012 FINAL   Final   CULTURE, BLOOD (ROUTINE X 2)     Status: Normal   Collection Time   11/02/12 10:33 AM      Component Value Range Status Comment   Specimen  Description BLOOD LEFT HAND   Final    Special Requests BOTTLES DRAWN AEROBIC AND ANAEROBIC 4CC   Final    Culture NO GROWTH 5 DAYS   Final    Report Status 11/07/2012 FINAL   Final   CULTURE, BLOOD (ROUTINE X 2)     Status: Normal   Collection Time   11/02/12 10:50 AM      Component Value Range Status Comment   Specimen Description BLOOD RIGHT FOREARM   Final    Special Requests BOTTLES DRAWN AEROBIC AND ANAEROBIC 4CC   Final    Culture NO GROWTH 5 DAYS   Final    Report Status 11/07/2012 FINAL   Final   CULTURE, EXPECTORATED SPUTUM-ASSESSMENT     Status: Normal   Collection Time   11/02/12  2:13 PM      Component Value Range Status Comment   Specimen Description SPUTUM   Final    Special Requests Normal   Final    Sputum evaluation     Final    Value: THIS SPECIMEN IS ACCEPTABLE. RESPIRATORY CULTURE REPORT TO FOLLOW. SMEAR REVIEWED 11/03/2012 BY BAUGHAM,M. CORRECTED ON 01/06 AT 1229: PREVIOUSLY REPORTED AS MICROSCOPIC  FINDINGS SUGGEST THAT THIS SPECIMEN IS NOT REPRESENTATIVE OF LOWER RESPIRATORY      SECRETIONS. PLEASE RECOLLECT.     Gram Stain Report Called to,Read Back By and Verified With: KNIGHT, C. AT 15:55PM ON 11/02/12 BY PRUITT, C.   Report Status 11/03/2012 FINAL   Final   CULTURE, RESPIRATORY     Status: Normal   Collection Time   11/02/12  2:13 PM      Component Value Range Status Comment   Specimen Description SPUTUM EXPECTORATED   Final    Special Requests NONE   Final    Gram Stain     Final    Value: ABUNDANT WBC PRESENT,BOTH PMN AND MONONUCLEAR     RARE SQUAMOUS EPITHELIAL CELLS PRESENT     MODERATE GRAM POSITIVE RODS     MODERATE GRAM POSITIVE COCCI IN CLUSTERS     IRP   Culture     Final    Value: ABUNDANT METHICILLIN RESISTANT STAPHYLOCOCCUS AUREUS     Note: RIFAMPIN AND GENTAMICIN SHOULD NOT BE USED AS SINGLE DRUGS FOR TREATMENT OF STAPH INFECTIONS. This organism DOES NOT demonstrate inducible Clindamycin resistance in vitro. CRITICAL RESULT CALLED TO, READ BACK  BY AND VERIFIED WITH: MEGAN B@7 :42AM ON      11/06/12 BY DANTS   Report Status 11/06/2012 FINAL   Final    Organism ID, Bacteria METHICILLIN RESISTANT STAPHYLOCOCCUS AUREUS   Final    Medical History: Past Medical History  Diagnosis Date  . Pancreatitis   . Seizures   . Back pain   . ETOH abuse   . COPD (chronic obstructive pulmonary disease)   . SAH (subarachnoid hemorrhage)   . Macrocytosis 08/17/2011  . Bradycardia 08/17/2011  . Pneumonia    Medications:  Scheduled:     . albuterol  2.5 mg Nebulization Q6H  . carbamazepine  200 mg Oral Q24H  . carbamazepine  400 mg Oral BID  . enoxaparin (LOVENOX) injection  40 mg Subcutaneous Q24H  . feeding supplement  30 mL Oral TID WC  . feeding supplement  1 Container Oral TID BM  . guaiFENesin  600 mg Oral BID  . ipratropium  0.5 mg Nebulization Q6H  . levETIRAcetam  500 mg Oral BID  . levofloxacin (LEVAQUIN) IV  750 mg Intravenous Q24H  . PARoxetine  20 mg Oral Daily  . piperacillin-tazobactam (ZOSYN)  IV  3.375 g Intravenous Q8H  . potassium chloride  10 mEq Oral BID  . vancomycin  1,250 mg Intravenous Q8H  . [DISCONTINUED] levofloxacin (LEVAQUIN) IV  750 mg Intravenous Q24H   Assessment: 37yo male who was hospitalized for the same symptoms and discharged 4 days ago after threatening to leave AMA.  Pt got worse after discharge and is now re-admitted for pna.  He has LLL consolidation and cavitation, pna, and possible aspiration.  He is currently on  Day#6 broad spectrum antibiotics with Vancomycin, Levaquin & Zosyn. Pt's renal fxn is stable. Vancomycin trough level was still below goal today despite dose increase to 1.25gm IV q8hrs.  Pt has excellent Vancomycin clearance.  Respiratory cx is +MRSA.  Pulmonary consulted and awaiting input.  Goal of Therapy:  Vancomycin trough level 15-20 mcg/ml  Plan: Increase Vancomycin to 2gm IV q8hrs Recheck trough when appropriate Continue Zosyn 3.375gm IV q8hrs (4hr infusion) & Levaquin  750mg  IV Q24h Monitor labs, renal fxn, and cultures per protocol Duration of therapy per MD  Elson Clan 11/07/2012,5:11 PM

## 2012-11-08 NOTE — Progress Notes (Signed)
[name], GILL NO.:  [id number]  MEDICAL RECORD NO.:  [id number]  LOCATION:  A312                          FACILITY:  [facility]  PHYSICIAN:  Melvyn Novas, MDDATE OF BIRTH:  12-07-1975  DATE OF PROCEDURE: DATE OF DISCHARGE:                                PROGRESS NOTE   The patient has severe COPD, history of polysubstance abuse, left upper lobe cavitary lesion with pneumonia, noncompliance.  The patient currently controlled on 3 antibiotics including vanc, Zosyn, and Levaquin.  Additional complications includes chronic seizure disorder with no active seizures and hyponatremia etiology undetermined.  CT scan shows no evidence of carcinoma.  Blood pressure is 96/55, temperature 98.8, pulse is 78 and regular, respiratory rate is 20.  Sodium was 121 with a creatinine of 0.3.  Awaiting today's sodium.  Diet has been switched to regular diet with added salt, currently on normal saline. Lungs show prolonged inspiratory and expiratory phases.  Scattered rhonchi.  No rales.  Audible heart. Regular rhythm.  No S3, S4, gallops also appreciated.  WBC is down to 11.5, hemoglobin stable at 9.1.  PLAN:  Right now is to get BMET and CBC in a.m. and BMET daily.  Change sodium in diet. Monitor hyponatremia.  We will consider fluid restriction if necessary later.  Renal function appears normal. Continue triple antibiotics and nebulizer therapy.     Melvyn Novas, MD     RMD/MEDQ  D:  11/08/2012  T:  11/08/2012  Job:  [id number]

## 2012-11-08 NOTE — Progress Notes (Signed)
[id number] 

## 2012-11-08 NOTE — Progress Notes (Signed)
NUTRITION FOLLOW UP  Intervention:     Continue Resource Breeze po TID, each supplement provides 250 kcal and 9 grams of protein. ProStat 30 ml TID provides 300 kcal, 45 gr protein daily.  Downgrade diet to Mechanical soft due to chewing difficulty   Nutrition Dx:   Inadequate oral intake improving  Goal:   Pt to meet >/= 90% of their estimated nutrition needs;  met  Monitor:   Meal and supplement intake  Assessment:   Pt is having difficulty chewing meats due to poor detention. He it taking Breeze and ProStat daily.   Height: Ht Readings from Last 1 Encounters:  11/02/12 5\' 9"  (1.753 m)    Weight Status:   Wt Readings from Last 1 Encounters:  11/02/12 115 lb 15.4 oz (52.6 kg)    Re-estimated needs:  Kcal: 1600-1900 Protein: 78-93 gr Fluid: 1 ml/kcal  Skin: no issues noted  Diet Order: General   Intake/Output Summary (Last 24 hours) at 11/08/12 1200 Last data filed at 11/08/12 0524  Gross per 24 hour  Intake 12778.24 ml  Output    200 ml  Net 12578.24 ml    Last BM: 11/07/12   Labs:   Lab 11/06/12 0622 11/05/12 0443 11/04/12 0518  NA 121* 123* 126*  K 3.7 3.8 3.7  CL 86* 89* 91*  CO2 28 25 26   BUN 4* 3* 3*  CREATININE 0.30* 0.32* 0.30*  CALCIUM 8.5 8.5 8.3*  MG -- -- --  PHOS -- -- --  GLUCOSE 130* 94 89    CBG (last 3)  No results found for this basename: GLUCAP:3 in the last 72 hours  Scheduled Meds:   . albuterol  2.5 mg Nebulization Q6H  . carbamazepine  200 mg Oral Q24H  . carbamazepine  400 mg Oral BID  . enoxaparin (LOVENOX) injection  40 mg Subcutaneous Q24H  . feeding supplement  30 mL Oral TID WC  . feeding supplement  1 Container Oral TID BM  . guaiFENesin  600 mg Oral BID  . ipratropium  0.5 mg Nebulization Q6H  . levETIRAcetam  500 mg Oral BID  . levofloxacin (LEVAQUIN) IV  750 mg Intravenous Q24H  . PARoxetine  20 mg Oral Daily  . piperacillin-tazobactam (ZOSYN)  IV  3.375 g Intravenous Q8H  . potassium chloride  10  mEq Oral BID  . vancomycin  2,000 mg Intravenous Q8H    Continuous Infusions:   . sodium chloride 125 mL/hr at 11/08/12 0008    [phone]

## 2012-11-09 LAB — CBC WITH DIFFERENTIAL/PLATELET
Basophils Absolute: 0 10*3/uL (ref 0.0–0.1)
Basophils Relative: 1 % (ref 0–1)
Eosinophils Absolute: 0.3 10*3/uL (ref 0.0–0.7)
Eosinophils Relative: 3 % (ref 0–5)
MCH: 36.2 pg — ABNORMAL HIGH (ref 26.0–34.0)
MCHC: 33.8 g/dL (ref 30.0–36.0)
MCV: 107 fL — ABNORMAL HIGH (ref 78.0–100.0)
Neutrophils Relative %: 63 % (ref 43–77)
Platelets: 500 10*3/uL — ABNORMAL HIGH (ref 150–400)
RBC: 2.71 MIL/uL — ABNORMAL LOW (ref 4.22–5.81)
RDW: 14.4 % (ref 11.5–15.5)

## 2012-11-09 LAB — BASIC METABOLIC PANEL
BUN: 3 mg/dL — ABNORMAL LOW (ref 6–23)
Calcium: 8.6 mg/dL (ref 8.4–10.5)
Creatinine, Ser: 0.26 mg/dL — ABNORMAL LOW (ref 0.50–1.35)
GFR calc non Af Amer: >90 mL/min (ref >90)
Glucose, Bld: 127 mg/dL — ABNORMAL HIGH (ref 70–99)
Sodium: 128 mEq/L — ABNORMAL LOW (ref 135–145)

## 2012-11-10 LAB — BASIC METABOLIC PANEL
Calcium: 9.4 mg/dL (ref 8.4–10.5)
GFR calc Af Amer: >90 mL/min (ref >90)
GFR calc non Af Amer: >90 mL/min (ref >90)
Potassium: 4.1 mEq/L (ref 3.5–5.1)
Sodium: 131 mEq/L — ABNORMAL LOW (ref 135–145)

## 2012-11-10 MED ORDER — OXYCODONE-ACETAMINOPHEN 10-325 MG PO TABS: 1.0000 | ORAL_TABLET | ORAL | Status: DC | PRN

## 2012-11-10 MED ORDER — CLINDAMYCIN HCL 300 MG PO CAPS: 300.0000 mg | ORAL_CAPSULE | Freq: Three times a day (TID) | ORAL | Status: DC

## 2012-11-10 MED ORDER — IPRATROPIUM BROMIDE 0.02 % IN SOLN: 0.5000 mg | Freq: Four times a day (QID) | RESPIRATORY_TRACT | Status: DC

## 2012-11-10 MED ORDER — AMOXICILLIN-POT CLAVULANATE 875-125 MG PO TABS: 1.0000 | ORAL_TABLET | Freq: Two times a day (BID) | ORAL | Status: DC

## 2012-11-10 MED ORDER — PAROXETINE HCL 20 MG PO TABS: 20.0000 mg | ORAL_TABLET | Freq: Every day | ORAL | Status: DC

## 2012-11-10 NOTE — Progress Notes (Signed)
[name], [name] NO.:  [id number]  MEDICAL RECORD NO.:  [id number]  LOCATION:                                 FACILITY:  PHYSICIAN:  Mila Homer. Sudie Bailey, M.D.DATE OF BIRTH:  1976-05-12  DATE OF PROCEDURE:  11/09/2012 DATE OF DISCHARGE:                                PROGRESS NOTE   SUBJECTIVE:  He says he is feeling somewhat better.  I have been talking about his substance use.  He does smoke cigarettes.  He does drink, by his admission, 4 beers a day.  He denies using other medication or illicit drugs.  He lives on [address].  OBJECTIVE:  VITAL SIGNS:  Temperature is 98.5, pulse 72, respiratory rate 17, blood pressure 117/73. GENERAL:  He is sitting up in bed.  He appears to be alert and oriented. LUNGS:  Show decreased breath sounds throughout, but he is moving air well. HEART:  Regular rhythm with a rate of about 80.  There is no edema of the ankles.  Today his white cell count is 7600, which is significantly better than his admission white cell count on November 02, 2012, of 22,000.  His hemoglobin is 9.8, which is improved compared to yesterday.  His sodium is 128 with a chloride of 95, improved compared to his last check.  Repeat chest x-ray shows persistent upper lobe airspace disease bilaterally and the cavitary lesion of the left upper lobe.  This is an air-fluid level.  There is a chronic compression deformity in the mid thoracic spine.  Chest x-ray really seemed to be pretty much unchanged compared to his last chest x-ray.  Blood cultures x2 were negative, but his sputum culture, obtained 3 days ago, grew out methicillin-resistant Staph aureus.  ASSESSMENT: 1. Methicillin resistant Staphylococcus aureus pneumonia. 2. Chronic obstructive pulmonary disease. 3. Tobacco use disorder. 4. Ethyl alcohol abuse. 5. History of seizures. 6. History of pancreatitis. 7. History of subarachnoid hemorrhage. 8. 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 9. Noncompliance. 10.History of polysubstance abuse based upon the dictation from his     local medical doctor. 11.Hyponatremia, improved.  This is probably secondary to pneumonia.  PLAN:  Continue with his antibiotics which are vancomycin, Zosyn, and levofloxacin.  Continue the IV fluids.  I discussed this with him.  He will be followed by his LMD, Dr. Delbert [name], tomorrow.     Mila Homer. Sudie Bailey, M.D.     SDK/MEDQ  D:  11/09/2012  T:  11/09/2012  Job:  [id number]

## 2012-11-10 NOTE — Progress Notes (Signed)
PharmD notified of Vanc Trough 20.6. Orders received to give scheduled dose of 0800. Will continue to monitor.

## 2012-11-10 NOTE — Progress Notes (Signed)
NAMEROMAR, WOODRICK NO.:  [id number]  MEDICAL RECORD NO.:  [id number]  LOCATION:                                 FACILITY:  PHYSICIAN:  Melvyn Novas, MDDATE OF BIRTH:  [birth year]  DATE OF PROCEDURE:  11/07/2012 DATE OF DISCHARGE:                                PROGRESS NOTE   SUBJECTIVE:  Chest x-ray reveals new air-fluid level within cavitary lesion of left upper lobe, some improvement in leukocytosis.  White count down to 11.5.  Hyponatremia persists despite a normal saline infusion down to 121, potassium 3.7, creatinine 0.3.  OBJECTIVE:  VITAL SIGNS:  Blood pressure 115/70, temperature 98.8, pulse 84 and regular, respiratory rate is 18.  Lungs show mild end-expiratory wheezes, scattered rhonchi.  No rales appreciable.  Heart regular rhythm.  No S3, S4.  PLAN:  Right now is to continue Levaquin, Zosyn, and vancomycin. Uncertain of duration of therapy or if thoracic surgery may be required for left upper lobe cavitary lesion.  We will reconsult with Pulmonary regarding the endpoints of antibiotic therapy.     Melvyn Novas, MD     RMD/MEDQ  D:  11/07/2012  T:  11/08/2012  Job:  [id number]

## 2012-11-10 NOTE — Discharge Summary (Signed)
[id number] 

## 2012-11-10 NOTE — Discharge Summary (Signed)
[id number]

## 2012-11-10 NOTE — Consult Note (Signed)
Consult requested by: Dr. Delbert Harness Consult requested for cavitary lung lesion:  HPI: This is a 37 year old who has multiple medical problems including COPD seizures history of polysubstance abuse and a left upper lobe cavitary pneumonia. He says he wants to go home. He says he is not coughing and does not have any more chest pain.  Past Medical History  Diagnosis Date  . Pancreatitis   . Seizures   . Back pain   . ETOH abuse   . COPD (chronic obstructive pulmonary disease)   . SAH (subarachnoid hemorrhage)   . Macrocytosis 08/17/2011  . Bradycardia 08/17/2011  . Pneumonia      Family History  Problem Relation Age of Onset  . Seizures Father   . Alcohol abuse Father   . Coronary artery disease Mother     deceased age 37     History   Social History  . Marital Status: Single    Spouse Name: N/A    Number of Children: N/A  . Years of Education: N/A   Social History Main Topics  . Smoking status: Current Every Day Smoker -- 1.0 packs/day for 20 years    Types: Cigarettes  . Smokeless tobacco: Never Used  . Alcohol Use: Yes     Comment: occasionally  . Drug Use: No  . Sexually Active: Not Currently   Other Topics Concern  . None   Social History Narrative  . None     ROS: He denies chest pain fever or chills. He is not coughing up any sputum    Objective: Vital signs in last 24 hours: Temp:  [97.9 F (36.6 C)-98 F (36.7 C)] 98 F (36.7 C) (01/13 0433) Pulse Rate:  [63-64] 64  (01/13 0433) Resp:  [18] 18  (01/13 0433) BP: (104-129)/(54-67) 104/54 mmHg (01/13 0433) SpO2:  [63 %-98 %] 95 % (01/13 0702) FiO2 (%):  [21 %] 21 % (01/13 0425) Weight change:  Last BM Date: 11/09/12  Intake/Output from previous day: 01/12 0701 - 01/13 0700 In: 4441.8 [P.O.:480; I.V.:3961.8] Out: -   PHYSICAL EXAM He is awake and alert. He is thin. His pupils are reactive. His medicines are clear. His neck is supple. His chest shows rhonchi bilaterally. His heart is  regular without rub gallop or rub. His abdomen is soft. Extremities showed no edema and his central nervous system exam is grossly intact  Lab Results: Basic Metabolic Panel:  Basename 11/10/12 0508 11/09/12 0636  NA 131* 128*  K 4.1 3.9  CL 95* 95*  CO2 27 25  GLUCOSE 131* 127*  BUN 4* 3*  CREATININE 0.30* 0.26*  CALCIUM 9.4 8.6  MG -- --  PHOS -- --   Liver Function Tests: No results found for this basename: AST:2,ALT:2,ALKPHOS:2,BILITOT:2,PROT:2,ALBUMIN:2 in the last 72 hours No results found for this basename: LIPASE:2,AMYLASE:2 in the last 72 hours No results found for this basename: AMMONIA:2 in the last 72 hours CBC:  Basename 11/09/12 0636  WBC 7.6  NEUTROABS 4.8  HGB 9.8*  HCT 29.0*  MCV 107.0*  PLT 500*   Cardiac Enzymes: No results found for this basename: CKTOTAL:3,CKMB:3,CKMBINDEX:3,TROPONINI:3 in the last 72 hours BNP: No results found for this basename: PROBNP:3 in the last 72 hours D-Dimer: No results found for this basename: DDIMER:2 in the last 72 hours CBG: No results found for this basename: GLUCAP:6 in the last 72 hours Hemoglobin A1C: No results found for this basename: HGBA1C in the last 72 hours Fasting Lipid Panel: No results found  for this basename: CHOL,HDL,LDLCALC,TRIG,CHOLHDL,LDLDIRECT in the last 72 hours Thyroid Function Tests: No results found for this basename: TSH,T4TOTAL,FREET4,T3FREE,THYROIDAB in the last 72 hours Anemia Panel: No results found for this basename: VITAMINB12,FOLATE,FERRITIN,TIBC,IRON,RETICCTPCT in the last 72 hours Coagulation: No results found for this basename: LABPROT:2,INR:2 in the last 72 hours Urine Drug Screen: Drugs of Abuse     Component Value Date/Time   LABOPIA NONE DETECTED 07/11/2012 0336   LABOPIA NEGATIVE 08/14/2011 1711   COCAINSCRNUR NONE DETECTED 07/11/2012 0336   COCAINSCRNUR NEGATIVE 08/14/2011 1711   LABBENZ NONE DETECTED 07/11/2012 0336   LABBENZ NEGATIVE 08/14/2011 1711   AMPHETMU NONE  DETECTED 07/11/2012 0336   AMPHETMU NEGATIVE 08/14/2011 1711   THCU POSITIVE* 07/11/2012 0336   LABBARB NONE DETECTED 07/11/2012 0336    Alcohol Level: No results found for this basename: ETH:2 in the last 72 hours Urinalysis: No results found for this basename: COLORURINE:2,APPERANCEUR:2,LABSPEC:2,PHURINE:2,GLUCOSEU:2,HGBUR:2,BILIRUBINUR:2,KETONESUR:2,PROTEINUR:2,UROBILINOGEN:2,NITRITE:2,LEUKOCYTESUR:2 in the last 72 hours Misc. Labs:   ABGS: No results found for this basename: PHART,PCO2,PO2ART,TCO2,HCO3 in the last 72 hours   MICROBIOLOGY: Recent Results (from the past 240 hour(s))  CULTURE, BLOOD (ROUTINE X 2)     Status: Normal   Collection Time   11/02/12 10:33 AM      Component Value Range Status Comment   Specimen Description BLOOD LEFT HAND   Final    Special Requests BOTTLES DRAWN AEROBIC AND ANAEROBIC 4CC   Final    Culture NO GROWTH 5 DAYS   Final    Report Status 11/07/2012 FINAL   Final   CULTURE, BLOOD (ROUTINE X 2)     Status: Normal   Collection Time   11/02/12 10:50 AM      Component Value Range Status Comment   Specimen Description BLOOD RIGHT FOREARM   Final    Special Requests BOTTLES DRAWN AEROBIC AND ANAEROBIC 4CC   Final    Culture NO GROWTH 5 DAYS   Final    Report Status 11/07/2012 FINAL   Final   CULTURE, EXPECTORATED SPUTUM-ASSESSMENT     Status: Normal   Collection Time   11/02/12  2:13 PM      Component Value Range Status Comment   Specimen Description SPUTUM   Final    Special Requests Normal   Final    Sputum evaluation     Final    Value: THIS SPECIMEN IS ACCEPTABLE. RESPIRATORY CULTURE REPORT TO FOLLOW. SMEAR REVIEWED 11/03/2012 BY BAUGHAM,M. CORRECTED ON 01/06 AT 1229: PREVIOUSLY REPORTED AS MICROSCOPIC FINDINGS SUGGEST THAT THIS SPECIMEN IS NOT REPRESENTATIVE OF LOWER RESPIRATORY      SECRETIONS. PLEASE RECOLLECT.     Gram Stain Report Called to,Read Back By and Verified With: KNIGHT, C. AT 15:55PM ON 11/02/12 BY PRUITT, C.   Report Status  11/03/2012 FINAL   Final   CULTURE, RESPIRATORY     Status: Normal   Collection Time   11/02/12  2:13 PM      Component Value Range Status Comment   Specimen Description SPUTUM EXPECTORATED   Final    Special Requests NONE   Final    Gram Stain     Final    Value: ABUNDANT WBC PRESENT,BOTH PMN AND MONONUCLEAR     RARE SQUAMOUS EPITHELIAL CELLS PRESENT     MODERATE GRAM POSITIVE RODS     MODERATE GRAM POSITIVE COCCI IN CLUSTERS     IRP   Culture     Final    Value: ABUNDANT METHICILLIN RESISTANT STAPHYLOCOCCUS AUREUS     Note: RIFAMPIN  AND GENTAMICIN SHOULD NOT BE USED AS SINGLE DRUGS FOR TREATMENT OF STAPH INFECTIONS. This organism DOES NOT demonstrate inducible Clindamycin resistance in vitro. CRITICAL RESULT CALLED TO, READ BACK BY AND VERIFIED WITH: MEGAN B@7 :42AM ON      11/06/12 BY DANTS   Report Status 11/06/2012 FINAL   Final    Organism ID, Bacteria METHICILLIN RESISTANT STAPHYLOCOCCUS AUREUS   Final     Studies/Results: No results found.  Medications:  Prior to Admission:  Prescriptions prior to admission  Medication Sig Dispense Refill  . albuterol (PROVENTIL HFA;VENTOLIN HFA) 108 (90 BASE) MCG/ACT inhaler Inhale 1-2 puffs into the lungs every 6 (six) hours as needed for wheezing.  1 Inhaler  0  . amoxicillin-clavulanate (AUGMENTIN) 875-125 MG per tablet Take 1 tablet by mouth every 12 (twelve) hours.  20 tablet  0  . carbamazepine (TEGRETOL) 200 MG tablet Take 200-400 mg by mouth 3 (three) times daily. Take 2 tablets in the morning, 1 tablet at lunch and 2 tablets at bedtime.      . levETIRAcetam (KEPPRA) 500 MG tablet Take 500 mg by mouth 2 (two) times daily.       . ondansetron (ZOFRAN) 4 MG tablet Take 1 tablet (4 mg total) by mouth every 6 (six) hours.  12 tablet  0  . oxyCODONE-acetaminophen (TYLOX) 5-500 MG per capsule Take 1 capsule by mouth every 6 (six) hours as needed. For pain  30 capsule  0  . potassium chloride (K-DUR) 10 MEQ tablet Take 1 tablet (10 mEq total)  by mouth 2 (two) times daily.  60 tablet  0  . traMADol (ULTRAM) 50 MG tablet Take 1 tablet (50 mg total) by mouth every 6 (six) hours as needed for pain.  30 tablet  0   Scheduled:   . albuterol  2.5 mg Nebulization Q6H  . carbamazepine  200 mg Oral Q24H  . carbamazepine  400 mg Oral BID  . enoxaparin (LOVENOX) injection  40 mg Subcutaneous Q24H  . feeding supplement  30 mL Oral TID WC  . feeding supplement  1 Container Oral TID BM  . guaiFENesin  600 mg Oral BID  . ipratropium  0.5 mg Nebulization Q6H  . levETIRAcetam  500 mg Oral BID  . levofloxacin (LEVAQUIN) IV  750 mg Intravenous Q24H  . PARoxetine  20 mg Oral Daily  . piperacillin-tazobactam (ZOSYN)  IV  3.375 g Intravenous Q8H  . potassium chloride  10 mEq Oral BID  . vancomycin  2,000 mg Intravenous Q8H   Continuous:   . sodium chloride 125 mL/hr at 11/09/12 1509   ZOX:[id number], acetaminophen, albuterol, alum & mag hydroxide-simeth, hydrocortisone cream, HYDROmorphone (DILAUDID) injection, HYDROmorphone (DILAUDID) injection, ondansetron (ZOFRAN) IV, ondansetron, oxyCODONE  Assesment: He has a cavitary pneumonia. This is likely from aspiration. He is on 3 antibiotics now. His sodium level is very low his much improved. CT is not suggestive of tuberculosis or of cancer. Active Problems:  * No active hospital problems. *     Plan: I think with his improvement he can probably be switched to oral antibiotics. I would put him on something like Augmentin or a combination of Ceftin and Flagyl and he will need to be on that until this cavity is radiographically gone. Alternatively we could place a PICC line and have him on IV antibiotics I think the potential for diversion with that  is high. He will need chest x-rays about every 2 weeks and check on his antibiotic use as frequently as  is feasible    LOS: 8 days   Curtisha Bendix L 11/10/2012, 8:34 AM

## 2012-11-10 NOTE — Progress Notes (Signed)
ANTIBIOTIC CONSULT NOTE  Pharmacy Consult for Vancomycin, Levaquin and Zosyn Indication: pneumonia  No Known Allergies  Patient Measurements: Height: 5\' 9"  (175.3 cm) Weight: 115 lb 15.4 oz (52.6 kg) IBW/kg (Calculated) : 70.7   Vital Signs: Temp: 98 F (36.7 C) (01/13 0433) Temp src: Oral (01/13 0433) BP: 104/54 mmHg (01/13 0433) Pulse Rate: 64  (01/13 0433) Intake/Output from previous day: 01/12 0701 - 01/13 0700 In: 4441.8 [P.O.:480; I.V.:3961.8] Out: -  Intake/Output from this shift: Total I/O In: 620 [P.O.:120; IV Piggyback:500] Out: -   Labs:  Basename 11/10/12 0508 11/09/12 0636  WBC -- 7.6  HGB -- 9.8*  PLT -- 500*  LABCREA -- --  CREATININE 0.30* 0.26*   Estimated Creatinine Clearance: 94.1 ml/min (by C-G formula based on Cr of 0.3).  Basename 11/10/12 0703 11/07/12 1601  VANCOTROUGH 20.6* 10.1  VANCOPEAK -- --  Drue Dun -- --  GENTTROUGH -- --  GENTPEAK -- --  GENTRANDOM -- --  TOBRATROUGH -- --  TOBRAPEAK -- --  TOBRARND -- --  AMIKACINPEAK -- --  AMIKACINTROU -- --  AMIKACIN -- --    Microbiology: Recent Results (from the past 720 hour(s))  CULTURE, BLOOD (ROUTINE X 2)     Status: Normal   Collection Time   10/26/12  3:17 PM      Component Value Range Status Comment   Specimen Description RIGHT ANTECUBITAL   Final    Special Requests BOTTLES DRAWN AEROBIC AND ANAEROBIC 6CC   Final    Culture NO GROWTH 6 DAYS   Final    Report Status 11/01/2012 FINAL   Final   CULTURE, BLOOD (ROUTINE X 2)     Status: Normal   Collection Time   10/26/12  3:18 PM      Component Value Range Status Comment   Specimen Description LEFT ANTECUBITAL   Final    Special Requests BOTTLES DRAWN AEROBIC ONLY 6CC   Final    Culture NO GROWTH 6 DAYS   Final    Report Status 11/01/2012 FINAL   Final   CULTURE, BLOOD (ROUTINE X 2)     Status: Normal   Collection Time   11/02/12 10:33 AM      Component Value Range Status Comment   Specimen Description BLOOD LEFT  HAND   Final    Special Requests BOTTLES DRAWN AEROBIC AND ANAEROBIC 4CC   Final    Culture NO GROWTH 5 DAYS   Final    Report Status 11/07/2012 FINAL   Final   CULTURE, BLOOD (ROUTINE X 2)     Status: Normal   Collection Time   11/02/12 10:50 AM      Component Value Range Status Comment   Specimen Description BLOOD RIGHT FOREARM   Final    Special Requests BOTTLES DRAWN AEROBIC AND ANAEROBIC 4CC   Final    Culture NO GROWTH 5 DAYS   Final    Report Status 11/07/2012 FINAL   Final   CULTURE, EXPECTORATED SPUTUM-ASSESSMENT     Status: Normal   Collection Time   11/02/12  2:13 PM      Component Value Range Status Comment   Specimen Description SPUTUM   Final    Special Requests Normal   Final    Sputum evaluation     Final    Value: THIS SPECIMEN IS ACCEPTABLE. RESPIRATORY CULTURE REPORT TO FOLLOW. SMEAR REVIEWED 11/03/2012 BY BAUGHAM,M. CORRECTED ON 01/06 AT 1229: PREVIOUSLY REPORTED AS MICROSCOPIC FINDINGS SUGGEST THAT THIS SPECIMEN IS NOT  REPRESENTATIVE OF LOWER RESPIRATORY      SECRETIONS. PLEASE RECOLLECT.     Gram Stain Report Called to,Read Back By and Verified With: KNIGHT, C. AT 15:55PM ON 11/02/12 BY PRUITT, C.   Report Status 11/03/2012 FINAL   Final   CULTURE, RESPIRATORY     Status: Normal   Collection Time   11/02/12  2:13 PM      Component Value Range Status Comment   Specimen Description SPUTUM EXPECTORATED   Final    Special Requests NONE   Final    Gram Stain     Final    Value: ABUNDANT WBC PRESENT,BOTH PMN AND MONONUCLEAR     RARE SQUAMOUS EPITHELIAL CELLS PRESENT     MODERATE GRAM POSITIVE RODS     MODERATE GRAM POSITIVE COCCI IN CLUSTERS     IRP   Culture     Final    Value: ABUNDANT METHICILLIN RESISTANT STAPHYLOCOCCUS AUREUS     Note: RIFAMPIN AND GENTAMICIN SHOULD NOT BE USED AS SINGLE DRUGS FOR TREATMENT OF STAPH INFECTIONS. This organism DOES NOT demonstrate inducible Clindamycin resistance in vitro. CRITICAL RESULT CALLED TO, READ BACK BY AND VERIFIED WITH:  MEGAN B@7 :42AM ON      11/06/12 BY DANTS   Report Status 11/06/2012 FINAL   Final    Organism ID, Bacteria METHICILLIN RESISTANT STAPHYLOCOCCUS AUREUS   Final    Medical History: Past Medical History  Diagnosis Date  . Pancreatitis   . Seizures   . Back pain   . ETOH abuse   . COPD (chronic obstructive pulmonary disease)   . SAH (subarachnoid hemorrhage)   . Macrocytosis 08/17/2011  . Bradycardia 08/17/2011  . Pneumonia    Medications:  Scheduled:     . albuterol  2.5 mg Nebulization Q6H  . carbamazepine  200 mg Oral Q24H  . carbamazepine  400 mg Oral BID  . enoxaparin (LOVENOX) injection  40 mg Subcutaneous Q24H  . feeding supplement  30 mL Oral TID WC  . feeding supplement  1 Container Oral TID BM  . guaiFENesin  600 mg Oral BID  . ipratropium  0.5 mg Nebulization Q6H  . levETIRAcetam  500 mg Oral BID  . levofloxacin (LEVAQUIN) IV  750 mg Intravenous Q24H  . PARoxetine  20 mg Oral Daily  . piperacillin-tazobactam (ZOSYN)  IV  3.375 g Intravenous Q8H  . potassium chloride  10 mEq Oral BID  . vancomycin  2,000 mg Intravenous Q8H   Assessment: 37yo male on  Day#9 broad spectrum antibiotics with Vancomycin, Levaquin & Zosyn. Pt's renal fxn is stable.  Vancomycin trough level is now at goal today. Respiratory cx is +MRSA.  MRSA PNA is likely cause of cavitary lesions although could be from aspiration per Dr Juanetta Gosling note.   Goal of Therapy:  Vancomycin trough level 15-20 mcg/ml  Plan: Continue Vancomycin to 2gm IV q8hrs Continue Zosyn 3.375gm IV q8hrs (4hr infusion) & Levaquin 750mg  IV Q24h Weekly trough while on Vancomycin Monitor labs, renal fxn, and cultures per protocol Duration of therapy per MD - agree with Dr Juanetta Gosling recommendation for oral therapy at discharge.  Would suggest Clindamycin 300mg  po tid for MRSA PNA (and coverage for possible aspiration).  Elson Clan 11/10/2012,12:58 PM

## 2012-11-10 NOTE — Progress Notes (Signed)
Pt discharge home today per Dr. Janna Arch. Pt's IV site d/c'd and WNL. Pt's VS stable at this time. Pt provided with home medication list, discharge instructions and prescriptions. Pt verbalized understanding. Pt given prescription for DME use nebulizer machine. Pt given instructions to take to [facility] to fill. Pt verbalized understanding. Pt left floor in stable condition accompanied by NT.

## 2012-11-11 NOTE — Discharge Summary (Signed)
NAMEYAW, ESCOTO NO.:  [id number]  MEDICAL RECORD NO.:  [id number]  LOCATION:  A312                          FACILITY:  [facility]  PHYSICIAN:  Melvyn Novas, MDDATE OF BIRTH:  08-Oct-1976  DATE OF ADMISSION:  11/02/2012 DATE OF DISCHARGE:  01/13/2014LH                              DISCHARGE SUMMARY   ADDENDUM:  After discussion with pharmacist via conversation with Dr. Juanetta Gosling, it was felt due to MRSA positivity we will discharge the patient on clindamycin 300 mg p.o. t.i.d. for 2 weeks at least instead of Augmentin and will follow the patient with the same clinical and radiographic parameters as aforementioned in the previous discharge summary.     Melvyn Novas, MD     RMD/MEDQ  D:  11/10/2012  T:  11/11/2012  Job:  [id number]

## 2012-11-11 NOTE — Consult Note (Signed)
NAMEGRAYDEN, [name] NO.:  [id number]  MEDICAL RECORD NO.:  [id number]  LOCATION:                                 FACILITY:  PHYSICIAN:  Jerime Arif L. Juanetta Gosling, M.D.DATE OF BIRTH:  Mar 15, 1976  DATE OF CONSULTATION:  11/10/2012 DATE OF DISCHARGE:  11/10/2012                                CONSULTATION   ADDENDUM  The patient of Dr. Janna Arch.  After I had previously dictated my consult note, it was brought to my attention that [name]  had a positive sputum culture for MRSA.  He will therefore be placed on clindamycin instead of Augmentin.  I discussed this with Dr. Janna Arch.     [name] L. Juanetta Gosling, M.D.     ELH/MEDQ  D:  11/10/2012  T:  11/11/2012  Job:  [id number]

## 2012-11-11 NOTE — Discharge Summary (Signed)
NAMEOLLEN, RAO NO.:  [id number]  MEDICAL RECORD NO.:  [id number]  LOCATION:  A312                          FACILITY:  [facility]  PHYSICIAN:  Melvyn Novas, MDDATE OF BIRTH:  1976-01-27  DATE OF ADMISSION:  11/02/2012 DATE OF DISCHARGE:  01/13/2014LH                              DISCHARGE SUMMARY   HOSPITAL COURSE:  The patient is a 37 year old white male with 2 recent admissions with a left upper lobe cavitary pneumonia who signed out AMA and was noncompliant with his antibiotics, re-admitted for the same.  He likewise has a history of polysubstance abuse, ethanol, chronic degenerative disk disease, chronic low back pain, depression, anxiety, COPD, and history of seizure disorder.  There was anemia of chronic disease.  The patient was admitted and placed on 3 antibiotics, Levaquin and vancomycin as well as Zosyn.  He continued to defervesce the left upper lobe lesion and it became less having less respiratory distress, coughing sputum and so forth.  He was afebrile all the time in the hospital.  His white count, leukocytosis diminished also while in the hospital.  He was continued for an additional 9 days on 3 antibiotics. Consultation with Pulmonary.  Discussion with Dr. Juanetta Gosling and he felt it was okay to discharge him on Augmentin which should cover any anaerobic bacteria also.  The patient did not report a seizure activity in the hospital.  Recent chest x-ray 2 days prior to admission reveals mild air- fluid level in cavitary space with resolution of infiltrates in other lung areas.  It was felt to continue Augmentin 875/125 q.12 h. for a period of at least 2 weeks or possibly longer and there was no evidence of neoplasm.  He did have concomitant hypokalemia as well as hyponatremia in hospital.  CT scan revealed no evidence of carcinoma or lesion within the cavitary lesion.  DISCHARGE MEDICATIONS:  His discharge medicines included  Atrovent inhaler 2 puffs q.6 h., oxycodone 10/325 #30 capsules one q.4-6 h. p.r.n. no refills, Paxil 20 mg p.o. daily, Augmentin clavulanic acid 875/125 one p.o. q.12 h. #30, albuterol inhaler 2 puffs q.i.d., Tegretol 200 mg p.o. t.i.d., Keppra 500 mg p.o. b.i.d., potassium chloride 10 mEq p.o. b.i.d.  DISCHARGE INSTRUCTIONS:  The patient will follow up in my office in 4 days' time to check potassium, sodium, and compliance with his outpatient antibiotics as he was noncompliant last time and he signed out AMA.  We will keep a sure leash on him.  He understands that he has a serious life-threatening condition and he must comply with every medicine prescribed and he will have followup chest x-rays and CT scans to check for complete clinical and radiographic resolution.  Otherwise, the therapeutic modalities will be entertained.     Melvyn Novas, MD    RMD/MEDQ  D:  11/10/2012  T:  11/11/2012  Job:  [id number]

## 2012-11-17 ENCOUNTER — Other Ambulatory Visit (HOSPITAL_COMMUNITY): Payer: Self-pay | Admitting: Family Medicine

## 2012-11-17 ENCOUNTER — Ambulatory Visit (HOSPITAL_COMMUNITY)
Admission: RE | Admit: 2012-11-17 | Discharge: 2012-11-17 | Disposition: A | Discharge status: Home / Self Care | Payer: Medicaid Other | Source: Ambulatory Visit | Attending: Family Medicine | Admitting: Family Medicine

## 2012-11-17 DIAGNOSIS — Z09 Encounter for follow-up examination after completed treatment for conditions other than malignant neoplasm: Secondary | ICD-10-CM | POA: Insufficient documentation

## 2012-11-17 DIAGNOSIS — J189 Pneumonia, unspecified organism: Secondary | ICD-10-CM

## 2012-12-03 ENCOUNTER — Ambulatory Visit (HOSPITAL_COMMUNITY)
Admission: RE | Admit: 2012-12-03 | Discharge: 2012-12-03 | Disposition: A | Discharge status: Home / Self Care | Payer: Medicaid Other | Source: Ambulatory Visit | Attending: Family Medicine | Admitting: Family Medicine

## 2012-12-03 ENCOUNTER — Other Ambulatory Visit (HOSPITAL_COMMUNITY): Payer: Self-pay | Admitting: Family Medicine

## 2012-12-03 DIAGNOSIS — R059 Cough, unspecified: Secondary | ICD-10-CM | POA: Insufficient documentation

## 2012-12-03 DIAGNOSIS — J189 Pneumonia, unspecified organism: Secondary | ICD-10-CM

## 2012-12-03 DIAGNOSIS — J438 Other emphysema: Secondary | ICD-10-CM | POA: Insufficient documentation

## 2012-12-03 DIAGNOSIS — R05 Cough: Secondary | ICD-10-CM | POA: Insufficient documentation

## 2012-12-03 DIAGNOSIS — R0602 Shortness of breath: Secondary | ICD-10-CM | POA: Insufficient documentation

## 2012-12-17 ENCOUNTER — Other Ambulatory Visit (HOSPITAL_COMMUNITY): Payer: Self-pay | Admitting: Family Medicine

## 2012-12-17 ENCOUNTER — Ambulatory Visit (HOSPITAL_COMMUNITY)
Admission: RE | Admit: 2012-12-17 | Discharge: 2012-12-17 | Disposition: A | Discharge status: Home / Self Care | Payer: Medicaid Other | Source: Ambulatory Visit | Attending: Family Medicine | Admitting: Family Medicine

## 2012-12-17 DIAGNOSIS — J189 Pneumonia, unspecified organism: Secondary | ICD-10-CM

## 2012-12-17 DIAGNOSIS — Z09 Encounter for follow-up examination after completed treatment for conditions other than malignant neoplasm: Secondary | ICD-10-CM | POA: Insufficient documentation

## 2013-01-21 ENCOUNTER — Other Ambulatory Visit (HOSPITAL_COMMUNITY): Payer: Self-pay | Admitting: Family Medicine

## 2013-01-21 ENCOUNTER — Ambulatory Visit (HOSPITAL_COMMUNITY)
Admission: RE | Admit: 2013-01-21 | Discharge: 2013-01-21 | Disposition: A | Discharge status: Home / Self Care | Payer: Medicaid Other | Source: Ambulatory Visit | Attending: Family Medicine | Admitting: Family Medicine

## 2013-01-21 DIAGNOSIS — F17200 Nicotine dependence, unspecified, uncomplicated: Secondary | ICD-10-CM | POA: Insufficient documentation

## 2013-01-21 DIAGNOSIS — J189 Pneumonia, unspecified organism: Secondary | ICD-10-CM

## 2013-02-22 ENCOUNTER — Emergency Department (HOSPITAL_COMMUNITY)
Admission: EM | Admit: 2013-02-22 | Discharge: 2013-02-22 | Disposition: A | Discharge status: Home / Self Care | Payer: Medicaid Other | Attending: Emergency Medicine | Admitting: Emergency Medicine

## 2013-02-22 ENCOUNTER — Encounter (HOSPITAL_COMMUNITY): Payer: Self-pay | Admitting: *Deleted

## 2013-02-22 ENCOUNTER — Emergency Department (HOSPITAL_COMMUNITY): Payer: Medicaid Other

## 2013-02-22 DIAGNOSIS — J449 Chronic obstructive pulmonary disease, unspecified: Secondary | ICD-10-CM | POA: Insufficient documentation

## 2013-02-22 DIAGNOSIS — J4489 Other specified chronic obstructive pulmonary disease: Secondary | ICD-10-CM | POA: Insufficient documentation

## 2013-02-22 DIAGNOSIS — R0789 Other chest pain: Secondary | ICD-10-CM | POA: Insufficient documentation

## 2013-02-22 DIAGNOSIS — Z8679 Personal history of other diseases of the circulatory system: Secondary | ICD-10-CM | POA: Insufficient documentation

## 2013-02-22 DIAGNOSIS — M549 Dorsalgia, unspecified: Secondary | ICD-10-CM | POA: Insufficient documentation

## 2013-02-22 DIAGNOSIS — G40909 Epilepsy, unspecified, not intractable, without status epilepticus: Secondary | ICD-10-CM | POA: Insufficient documentation

## 2013-02-22 DIAGNOSIS — K859 Acute pancreatitis without necrosis or infection, unspecified: Secondary | ICD-10-CM | POA: Insufficient documentation

## 2013-02-22 DIAGNOSIS — R197 Diarrhea, unspecified: Secondary | ICD-10-CM | POA: Insufficient documentation

## 2013-02-22 DIAGNOSIS — Z79899 Other long term (current) drug therapy: Secondary | ICD-10-CM | POA: Insufficient documentation

## 2013-02-22 DIAGNOSIS — F17200 Nicotine dependence, unspecified, uncomplicated: Secondary | ICD-10-CM | POA: Insufficient documentation

## 2013-02-22 DIAGNOSIS — R21 Rash and other nonspecific skin eruption: Secondary | ICD-10-CM | POA: Insufficient documentation

## 2013-02-22 DIAGNOSIS — Z8701 Personal history of pneumonia (recurrent): Secondary | ICD-10-CM | POA: Insufficient documentation

## 2013-02-22 DIAGNOSIS — G8929 Other chronic pain: Secondary | ICD-10-CM | POA: Insufficient documentation

## 2013-02-22 DIAGNOSIS — R509 Fever, unspecified: Secondary | ICD-10-CM | POA: Insufficient documentation

## 2013-02-22 DIAGNOSIS — R05 Cough: Secondary | ICD-10-CM | POA: Insufficient documentation

## 2013-02-22 DIAGNOSIS — R059 Cough, unspecified: Secondary | ICD-10-CM | POA: Insufficient documentation

## 2013-02-22 DIAGNOSIS — R110 Nausea: Secondary | ICD-10-CM | POA: Insufficient documentation

## 2013-02-22 DIAGNOSIS — R0602 Shortness of breath: Secondary | ICD-10-CM | POA: Insufficient documentation

## 2013-02-22 DIAGNOSIS — R51 Headache: Secondary | ICD-10-CM | POA: Insufficient documentation

## 2013-02-22 LAB — BASIC METABOLIC PANEL
CO2: 25 mEq/L (ref 19–32)
Calcium: 9.3 mg/dL (ref 8.4–10.5)
GFR calc non Af Amer: >90 mL/min (ref >90)
Glucose, Bld: 104 mg/dL — ABNORMAL HIGH (ref 70–99)
Potassium: 3.4 mEq/L — ABNORMAL LOW (ref 3.5–5.1)
Sodium: 129 mEq/L — ABNORMAL LOW (ref 135–145)

## 2013-02-22 LAB — CBC WITH DIFFERENTIAL/PLATELET
Basophils Absolute: 0 10*3/uL (ref 0.0–0.1)
Eosinophils Absolute: 0 10*3/uL (ref 0.0–0.7)
Eosinophils Relative: 0 % (ref 0–5)
Lymphocytes Relative: 27 % (ref 12–46)
Lymphs Abs: 2.6 10*3/uL (ref 0.7–4.0)
MCV: 96.6 fL (ref 78.0–100.0)
Neutrophils Relative %: 64 % (ref 43–77)
Platelets: 191 10*3/uL (ref 150–400)
RBC: 4.45 MIL/uL (ref 4.22–5.81)
RDW: 12.7 % (ref 11.5–15.5)
WBC: 9.7 10*3/uL (ref 4.0–10.5)

## 2013-02-22 LAB — HEPATIC FUNCTION PANEL
Alkaline Phosphatase: 136 U/L — ABNORMAL HIGH (ref 39–117)
Bilirubin, Direct: 0.1 mg/dL (ref 0.0–0.3)
Indirect Bilirubin: 0.3 mg/dL (ref 0.3–0.9)
Total Protein: 8 g/dL (ref 6.0–8.3)

## 2013-02-22 LAB — LIPASE, BLOOD: Lipase: 94 U/L — ABNORMAL HIGH (ref 11–59)

## 2013-02-22 MED ORDER — HYDROMORPHONE HCL PF 1 MG/ML IJ SOLN
1.0000 mg | Freq: Once | INTRAMUSCULAR | Status: AC
  Administered 2013-02-22: 1 mg via INTRAVENOUS
  Filled 2013-02-22: qty 1

## 2013-02-22 MED ORDER — ONDANSETRON 4 MG PO TBDP: 4.0000 mg | ORAL_TABLET | Freq: Three times a day (TID) | ORAL | Status: DC | PRN

## 2013-02-22 MED ORDER — HYDROCODONE-ACETAMINOPHEN 5-325 MG PO TABS: 1.0000 | ORAL_TABLET | Freq: Four times a day (QID) | ORAL | Status: DC | PRN

## 2013-02-22 MED ORDER — PROMETHAZINE HCL 25 MG PO TABS: 25.0000 mg | ORAL_TABLET | Freq: Four times a day (QID) | ORAL | Status: DC | PRN

## 2013-02-22 MED ORDER — SODIUM CHLORIDE 0.9 % IV BOLUS (SEPSIS)
1000.0000 mL | Freq: Once | INTRAVENOUS | Status: AC
  Administered 2013-02-22: 1000 mL via INTRAVENOUS

## 2013-02-22 MED ORDER — SODIUM CHLORIDE 0.9 % IV SOLN
INTRAVENOUS | Status: DC
  Administered 2013-02-22 (×2): via INTRAVENOUS

## 2013-02-22 MED ORDER — IOHEXOL 300 MG/ML  SOLN
100.0000 mL | Freq: Once | INTRAMUSCULAR | Status: AC | PRN
  Administered 2013-02-22: 100 mL via INTRAVENOUS

## 2013-02-22 MED ORDER — ONDANSETRON HCL 4 MG/2ML IJ SOLN
4.0000 mg | Freq: Once | INTRAMUSCULAR | Status: AC
  Administered 2013-02-22: 4 mg via INTRAVENOUS
  Filled 2013-02-22: qty 2

## 2013-02-22 MED ORDER — ALBUTEROL SULFATE (5 MG/ML) 0.5% IN NEBU
2.5000 mg | INHALATION_SOLUTION | Freq: Once | RESPIRATORY_TRACT | Status: AC
  Administered 2013-02-22: 2.5 mg via RESPIRATORY_TRACT
  Filled 2013-02-22: qty 0.5

## 2013-02-22 MED ORDER — IOHEXOL 300 MG/ML  SOLN
50.0000 mL | Freq: Once | INTRAMUSCULAR | Status: AC | PRN
  Administered 2013-02-22: 50 mL via ORAL

## 2013-02-22 NOTE — ED Provider Notes (Signed)
History  This chart was scribed for Jose Jakes, MD by Ardelia Mems, ED Scribe. This patient was seen in room [facility] and the patient's care was started at 10:10 AM.   CSN: [id number]  Arrival date & time 02/22/13  1191     Chief Complaint  Patient presents with  . Pancreatitis    Patient is a 37 y.o. male presenting with abdominal pain. The history is provided by the patient. No language interpreter was used.  Abdominal Pain Pain location:  Epigastric Pain quality: sharp   Pain radiates to:  Does not radiate Pain severity:  Severe Duration:  3 days Timing:  Constant Chronicity:  Recurrent Context: alcohol use   Relieved by:  Nothing Associated symptoms: chest pain, chills, cough, diarrhea, fever, nausea and shortness of breath   Associated symptoms: no dysuria, no hematuria and no vomiting     HPI Comments: [name] is a 37 y.o. male with a h/o pancreatitis who presents to the Emergency Department complaining of "10/10" sharp, non-radiating epigastric abdominal pain that began 3 days ago. Pt relates current pain to previous pancreatitis pain. There is associated nausea, non-bloody diarrhea, fever and chills. Pt is tolerant of food and liquids. Pt is c/o cough with chest discomfort, SOB, chronic back pain, a rash to the right hand and HA. Pt denies vomiting, visual changes, dysuria, leg swelling or any other symptoms. Pt is a current every day smoker that has smoked 1 pack/day for 20 years. Pt uses alcohol as well.   Past Medical History  Diagnosis Date  . Pancreatitis   . Seizures   . Back pain   . ETOH abuse   . COPD (chronic obstructive pulmonary disease)   . SAH (subarachnoid hemorrhage)   . Macrocytosis 08/17/2011  . Bradycardia 08/17/2011  . Pneumonia     Past Surgical History  Procedure Laterality Date  . Back surgery    . Septoplasty      Family History  Problem Relation Age of Onset  . Seizures Father   . Alcohol abuse Father   .  Coronary artery disease Mother     deceased age 35    History  Substance Use Topics  . Smoking status: Current Every Day Smoker -- 1.00 packs/day for 20 years    Types: Cigarettes  . Smokeless tobacco: Never Used  . Alcohol Use: Yes     Comment: occasionally      Review of Systems  Constitutional: Positive for fever and chills.  HENT: Negative for congestion, rhinorrhea and neck pain.   Eyes: Negative for visual disturbance.  Respiratory: Positive for cough and shortness of breath. Negative for choking.   Cardiovascular: Positive for chest pain. Negative for leg swelling.  Gastrointestinal: Positive for nausea and diarrhea. Negative for vomiting and abdominal pain.  Genitourinary: Negative for dysuria and hematuria.  Musculoskeletal: Positive for back pain.  Skin: Positive for rash (right hand).  Neurological: Positive for headaches.  Psychiatric/Behavioral: Negative for confusion.    Allergies  Review of patient's allergies indicates no known allergies.  Home Medications   Current Outpatient Rx  Name  Route  Sig  Dispense  Refill  . carbamazepine (TEGRETOL) 200 MG tablet   Oral   Take 200-400 mg by mouth 3 (three) times daily. Take 2 tablets in the morning, 1 tablet at lunch if needed, 2 tablets in the evening         . ipratropium (ATROVENT) 0.02 % nebulizer solution   Nebulization   Take  2.5 mLs (0.5 mg total) by nebulization every 6 (six) hours.   75 mL   5   . levETIRAcetam (KEPPRA) 500 MG tablet   Oral   Take 500 mg by mouth 2 (two) times daily.          Marland Kitchen oxyCODONE-acetaminophen (PERCOCET) 10-325 MG per tablet   Oral   Take 1 tablet by mouth every 4 (four) hours as needed for pain.   30 tablet   0   . albuterol (PROVENTIL HFA;VENTOLIN HFA) 108 (90 BASE) MCG/ACT inhaler   Inhalation   Inhale 1-2 puffs into the lungs every 6 (six) hours as needed for wheezing.   1 Inhaler   0   . PARoxetine (PAXIL) 20 MG tablet   Oral   Take 1 tablet (20 mg  total) by mouth daily.   30 tablet   1     Triage Vitals: BP 145/85  Pulse 89  Temp(Src) 97.8 F (36.6 C) (Oral)  Resp 18  SpO2 98%  Physical Exam  Constitutional: He is oriented to person, place, and time. He appears well-developed and well-nourished. No distress.  HENT:  Head: Normocephalic and atraumatic.  Eyes: Conjunctivae and EOM are normal. Pupils are equal, round, and reactive to light. No scleral icterus.  Neck: Normal range of motion. Neck supple. No tracheal deviation present.  Cardiovascular: Normal rate, regular rhythm and normal heart sounds.   No murmur heard. Pulmonary/Chest: Effort normal. No respiratory distress. He has wheezes (left-sided).  Abdominal: Soft. Bowel sounds are normal. There is tenderness in the right upper quadrant and epigastric area. There is no rebound and no guarding.  Lymphadenopathy:    He has no cervical adenopathy.  Neurological: He is alert and oriented to person, place, and time.  Skin: Skin is warm and dry.  Fungal changes to right nail beds. Scaly, dry skin to back of hand and distal forearm on the right that pt implies is not acute.    ED Course  Procedures (including critical care time)  DIAGNOSTIC STUDIES: Oxygen Saturation is 98% on RA, normal by my interpretation.    COORDINATION OF CARE: 10:59 AM- Pt advised of plan for treatment and pt agrees.     Dg Chest 2 View  02/22/2013  *RADIOLOGY REPORT*  Clinical Data: Epigastric pain.  CHEST - 2 VIEW  Comparison: 01/21/2013 and prior chest radiographs  Findings: The cardiomediastinal silhouette is unremarkable. Mild peribronchial thickening and scattered areas of scarring are stable. There is no evidence of focal airspace disease, pulmonary edema, suspicious pulmonary nodule/mass, pleural effusion, or pneumothorax. No acute bony abnormalities are identified. A mid - lower thoracic compression fracture is unchanged.  IMPRESSION: No evidence of acute cardiopulmonary disease.    Original Report Authenticated By: Harmon Pier, M.D.    Ct Abdomen Pelvis W Contrast  02/22/2013  *RADIOLOGY REPORT*  Clinical Data: Abdominal and pelvic pain.  History of pancreatitis.  CT ABDOMEN AND PELVIS WITH CONTRAST  Technique:  Multidetector CT imaging of the abdomen and pelvis was performed following the standard protocol during bolus administration of intravenous contrast.  Contrast:  100 ml intravenous Omnipaque-300  Comparison: 06/10/2011 CT  Findings: Patchy ground-glass opacities within both lower lobes and lingula identified - question infection versus nonspecific inflammation.  The liver, spleen, adrenal glands, gallbladder and kidneys are unremarkable.  There may be very mild peripancreatic inflammation along the pancreatic head - correlate with laboratory values for pancreatitis. The remainder of the pancreas is unremarkable.  No free fluid, enlarged lymph nodes,  biliary dilation or abdominal aortic aneurysm identified.  The bowel and bladder are unremarkable.  Atherosclerotic narrowing of the proximal SMA is noted. No acute or suspicious bony abnormalities are identified. Moderate to large central disc protrusions at L4-L5 and L5-S1 are noted narrowing the spinal canal.  IMPRESSION: Equivocal very mild peripancreatic inflammation - correlate with laboratory values for pancreatitis.  Patchy ground-glass opacities within both lower lobes and lingula - question pneumonia. Nonspecific inflammatory processes may have this appearance  Moderate to large central disc protrusions at L4-L5 and L5-S1.   Original Report Authenticated By: Harmon Pier, M.D.    Results for orders placed during the hospital encounter of 02/22/13  CBC WITH DIFFERENTIAL      Result Value Range   WBC 9.7  4.0 - 10.5 K/uL   RBC 4.45  4.22 - 5.81 MIL/uL   Hemoglobin 15.2  13.0 - 17.0 g/dL   HCT 04.5  40.9 - 81.1 %   MCV 96.6  78.0 - 100.0 fL   MCH 34.2 (*) 26.0 - 34.0 pg   MCHC 35.3  30.0 - 36.0 g/dL   RDW 91.4  78.2 -  95.6 %   Platelets 191  150 - 400 K/uL   Neutrophils Relative 64  43 - 77 %   Neutro Abs 6.2  1.7 - 7.7 K/uL   Lymphocytes Relative 27  12 - 46 %   Lymphs Abs 2.6  0.7 - 4.0 K/uL   Monocytes Relative 8  3 - 12 %   Monocytes Absolute 0.8  0.1 - 1.0 K/uL   Eosinophils Relative 0  0 - 5 %   Eosinophils Absolute 0.0  0.0 - 0.7 K/uL   Basophils Relative 0  0 - 1 %   Basophils Absolute 0.0  0.0 - 0.1 K/uL  BASIC METABOLIC PANEL      Result Value Range   Sodium 129 (*) 135 - 145 mEq/L   Potassium 3.4 (*) 3.5 - 5.1 mEq/L   Chloride 92 (*) 96 - 112 mEq/L   CO2 25  19 - 32 mEq/L   Glucose, Bld 104 (*) 70 - 99 mg/dL   BUN 5 (*) 6 - 23 mg/dL   Creatinine, Ser 2.13 (*) 0.50 - 1.35 mg/dL   Calcium 9.3  8.4 - 08.6 mg/dL   GFR calc non Af Amer >90  >90 mL/min   GFR calc Af Amer >90  >90 mL/min  LIPASE, BLOOD      Result Value Range   Lipase 94 (*) 11 - 59 U/L  HEPATIC FUNCTION PANEL      Result Value Range   Total Protein 8.0  6.0 - 8.3 g/dL   Albumin 3.7  3.5 - 5.2 g/dL   AST 21  0 - 37 U/L   ALT 8  0 - 53 U/L   Alkaline Phosphatase 136 (*) 39 - 117 U/L   Total Bilirubin 0.4  0.3 - 1.2 mg/dL   Bilirubin, Direct 0.1  0.0 - 0.3 mg/dL   Indirect Bilirubin 0.3  0.3 - 0.9 mg/dL     1. Pancreatitis       MDM  Patient's lipase is mildly elevated not as bad as it has been in the past. No leukocytosis liver function tests are normal. No renal function abnormalities. CT scan shows mild information the pancreas consistent with pancreatitis. She has a known history of pancreatitis at times. Patient's not vomiting no acute distress urine emergent Gertie Gowda can be discharged home on clear liquids and  followup with primary care Dr. Pain medication and antinausea medicine provided. Chest x-ray was negative for pneumonia patient's had pneumonia in the past patient also is known to have COPD. Wheezing resolved with albuterol nebulizer here. Do not feel that clinically patient has pneumonia despite the  CAT scan showing some chronic changes in the lower part along I think it's more consistent with COPD.          I personally performed the services described in this documentation, which was scribed in my presence. The recorded information has been reviewed and is accurate.      Jose Jakes, MD 02/22/13 1415

## 2013-02-22 NOTE — ED Notes (Addendum)
Pt c/o epigastric pain that started three days ago, n/v this am, admits to drinking alcohol (6 pack) last night while watching the race.

## 2013-03-30 IMAGING — CR DG CHEST 2V
2 series · 2 of 2 positions shown · non-contrast
Comparison: Two-view chest 11/17/2012.

CLINICAL DATA: Cough and shortness of breath.

CHEST - 2 VIEW

[view not recorded (1 of 2)]
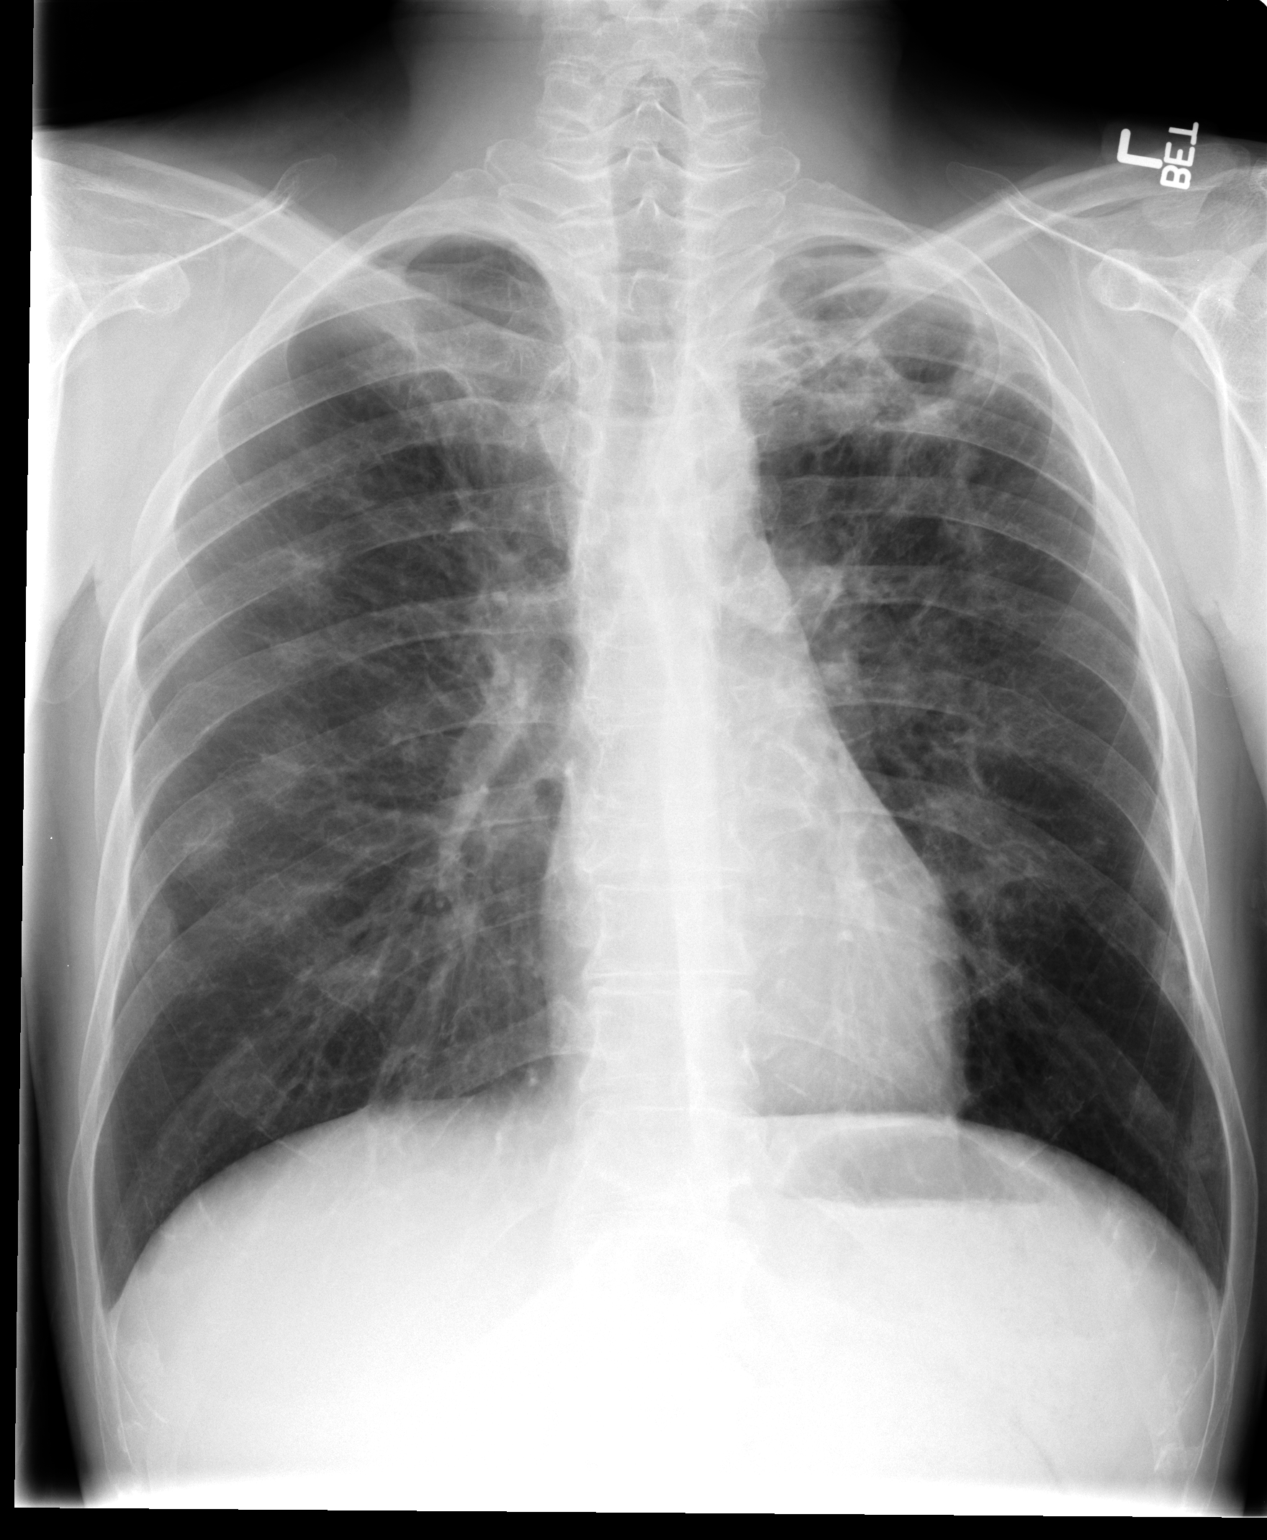

[view not recorded (2 of 2)]
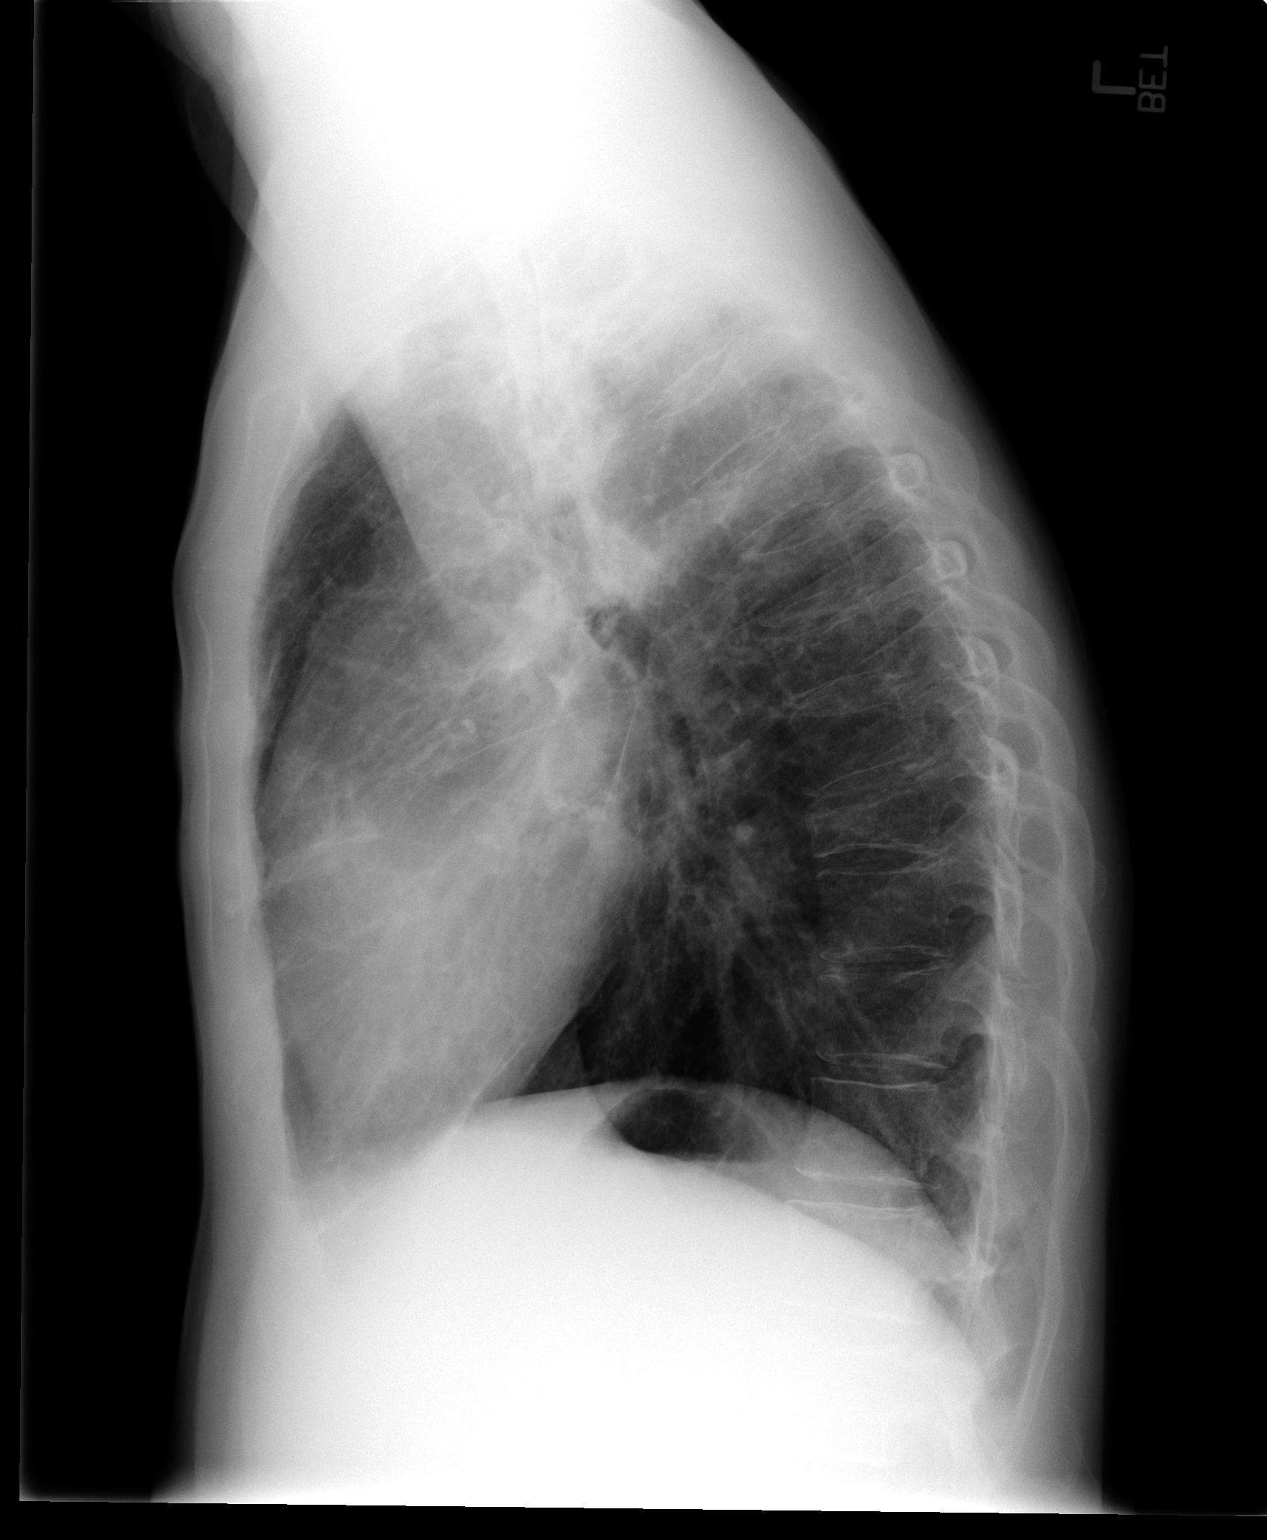

[2 of 2 positions shown; findings below may reference images not displayed]

FINDINGS: Heart size is normal.  The lobe airspace disease
continues to improve.  No new airspace disease is present.  The
visualized soft tissues and bony thorax are unremarkable.
IMPRESSION: 1.  Improving bilateral upper lobe airspace disease.
2.  Emphysema.

## 2013-04-13 ENCOUNTER — Emergency Department (HOSPITAL_COMMUNITY)
Admission: EM | Admit: 2013-04-13 | Discharge: 2013-04-13 | Disposition: A | Discharge status: Home / Self Care | Payer: Medicaid Other | Attending: Emergency Medicine | Admitting: Emergency Medicine

## 2013-04-13 ENCOUNTER — Encounter (HOSPITAL_COMMUNITY): Payer: Self-pay | Admitting: *Deleted

## 2013-04-13 ENCOUNTER — Emergency Department (HOSPITAL_COMMUNITY)
Admission: EM | Admit: 2013-04-13 | Discharge: 2013-04-13 | Disposition: A | Discharge status: Home / Self Care | Payer: Medicaid Other

## 2013-04-13 DIAGNOSIS — Z8709 Personal history of other diseases of the respiratory system: Secondary | ICD-10-CM | POA: Insufficient documentation

## 2013-04-13 DIAGNOSIS — R109 Unspecified abdominal pain: Secondary | ICD-10-CM

## 2013-04-13 DIAGNOSIS — F17200 Nicotine dependence, unspecified, uncomplicated: Secondary | ICD-10-CM | POA: Insufficient documentation

## 2013-04-13 DIAGNOSIS — Z8719 Personal history of other diseases of the digestive system: Secondary | ICD-10-CM | POA: Insufficient documentation

## 2013-04-13 DIAGNOSIS — J4489 Other specified chronic obstructive pulmonary disease: Secondary | ICD-10-CM | POA: Insufficient documentation

## 2013-04-13 DIAGNOSIS — Z8739 Personal history of other diseases of the musculoskeletal system and connective tissue: Secondary | ICD-10-CM | POA: Insufficient documentation

## 2013-04-13 DIAGNOSIS — Z79899 Other long term (current) drug therapy: Secondary | ICD-10-CM | POA: Insufficient documentation

## 2013-04-13 DIAGNOSIS — J449 Chronic obstructive pulmonary disease, unspecified: Secondary | ICD-10-CM | POA: Insufficient documentation

## 2013-04-13 DIAGNOSIS — Z8679 Personal history of other diseases of the circulatory system: Secondary | ICD-10-CM | POA: Insufficient documentation

## 2013-04-13 DIAGNOSIS — Z862 Personal history of diseases of the blood and blood-forming organs and certain disorders involving the immune mechanism: Secondary | ICD-10-CM | POA: Insufficient documentation

## 2013-04-13 LAB — COMPREHENSIVE METABOLIC PANEL
ALT: 23 U/L (ref 0–53)
AST: 41 U/L — ABNORMAL HIGH (ref 0–37)
Albumin: 3.9 g/dL (ref 3.5–5.2)
Alkaline Phosphatase: 108 U/L (ref 39–117)
CO2: 26 mEq/L (ref 19–32)
Chloride: 98 mEq/L (ref 96–112)
GFR calc non Af Amer: >90 mL/min (ref >90)
Potassium: 3.7 mEq/L (ref 3.5–5.1)
Sodium: 137 mEq/L (ref 135–145)
Total Bilirubin: 0.4 mg/dL (ref 0.3–1.2)

## 2013-04-13 LAB — CBC WITH DIFFERENTIAL/PLATELET
Basophils Absolute: 0 10*3/uL (ref 0.0–0.1)
Basophils Relative: 0 % (ref 0–1)
HCT: 38.7 % — ABNORMAL LOW (ref 39.0–52.0)
Lymphocytes Relative: 45 % (ref 12–46)
MCHC: 34.4 g/dL (ref 30.0–36.0)
Monocytes Absolute: 0.8 10*3/uL (ref 0.1–1.0)
Neutro Abs: 3.3 10*3/uL (ref 1.7–7.7)
Neutrophils Relative %: 44 % (ref 43–77)
Platelets: 221 10*3/uL (ref 150–400)
RDW: 14.1 % (ref 11.5–15.5)
WBC: 7.6 10*3/uL (ref 4.0–10.5)

## 2013-04-13 MED ORDER — HYDROCODONE-ACETAMINOPHEN 5-325 MG PO TABS: 1.0000 | ORAL_TABLET | Freq: Four times a day (QID) | ORAL | Status: DC | PRN

## 2013-04-13 MED ORDER — ONDANSETRON HCL 4 MG/2ML IJ SOLN
4.0000 mg | Freq: Once | INTRAMUSCULAR | Status: AC
  Administered 2013-04-13: 4 mg via INTRAVENOUS
  Filled 2013-04-13: qty 2

## 2013-04-13 MED ORDER — HYDROMORPHONE HCL PF 1 MG/ML IJ SOLN
1.0000 mg | Freq: Once | INTRAMUSCULAR | Status: AC
  Administered 2013-04-13: 1 mg via INTRAVENOUS
  Filled 2013-04-13: qty 1

## 2013-04-13 MED ORDER — SODIUM CHLORIDE 0.9 % IV BOLUS (SEPSIS)
250.0000 mL | Freq: Once | INTRAVENOUS | Status: AC
  Administered 2013-04-13: 250 mL via INTRAVENOUS

## 2013-04-13 MED ORDER — SODIUM CHLORIDE 0.9 % IV SOLN
INTRAVENOUS | Status: DC
  Administered 2013-04-13: 22:00:00 via INTRAVENOUS

## 2013-04-13 NOTE — ED Notes (Signed)
Pt reporting abdominal pain in right upper quadrant due to pancreatitis.  Denies nausea or vomiting.  Reports pain for past several days.

## 2013-04-13 NOTE — ED Notes (Signed)
Pt called to treatment area x 3 by Victorino Dike, RN, Darel Hong, RN, and [place], RN. No answer.

## 2013-04-13 NOTE — ED Provider Notes (Signed)
History     CSN: [id number]  Arrival date & time 04/13/13  1844   First MD Initiated Contact with Patient 04/13/13 2018      Chief Complaint  Patient presents with  . Abdominal Pain    (Consider location/radiation/quality/duration/timing/severity/associated sxs/prior treatment) Patient is a 37 y.o. male presenting with abdominal pain. The history is provided by the patient.  Abdominal Pain Associated symptoms include abdominal pain. Pertinent negatives include no chest pain, no headaches and no shortness of breath.   presents with pain similar to his pancreatic eyes. He is pain in the epigastric area in the right upper quadrant area. Denies nausea or vomiting. The patient was last seen by me on April 27 for mild pancreatitis CT scan then was negative. Other than showing some mild inflammation in the pancreas. Patient states pain 10 out of 10 does not radiate to the back not made worse or better by anything.Lambert Mody in nature.  Past Medical History  Diagnosis Date  . Pancreatitis   . Seizures   . Back pain   . ETOH abuse   . COPD (chronic obstructive pulmonary disease)   . SAH (subarachnoid hemorrhage)   . Macrocytosis 08/17/2011  . Bradycardia 08/17/2011  . Pneumonia     Past Surgical History  Procedure Laterality Date  . Back surgery    . Septoplasty      Family History  Problem Relation Age of Onset  . Seizures Father   . Alcohol abuse Father   . Coronary artery disease Mother     deceased age 9    History  Substance Use Topics  . Smoking status: Current Every Day Smoker -- 1.00 packs/day for 20 years    Types: Cigarettes  . Smokeless tobacco: Never Used  . Alcohol Use: Yes     Comment: occasionally      Review of Systems  Constitutional: Negative for fever.  HENT: Negative for congestion.   Eyes: Negative for visual disturbance.  Respiratory: Negative for shortness of breath.   Cardiovascular: Negative for chest pain.  Gastrointestinal: Positive for  abdominal pain.  Genitourinary: Negative for dysuria and hematuria.  Musculoskeletal: Negative for back pain.  Skin: Negative for rash.  Neurological: Negative for headaches.  Hematological: Does not bruise/bleed easily.  Psychiatric/Behavioral: Negative for confusion.    Allergies  Review of patient's allergies indicates no known allergies.  Home Medications   Current Outpatient Rx  Name  Route  Sig  Dispense  Refill  . carbamazepine (TEGRETOL) 200 MG tablet   Oral   Take 200-400 mg by mouth 3 (three) times daily. Take 2 tablets in the morning, 1 tablet at lunch if needed, 2 tablets in the evening         . ipratropium (ATROVENT) 0.02 % nebulizer solution   Nebulization   Take 2.5 mLs (0.5 mg total) by nebulization every 6 (six) hours.   75 mL   5   . levETIRAcetam (KEPPRA) 500 MG tablet   Oral   Take 500 mg by mouth 2 (two) times daily.          Marland Kitchen oxyCODONE-acetaminophen (PERCOCET) 10-325 MG per tablet   Oral   Take 1 tablet by mouth every 4 (four) hours as needed for pain.   30 tablet   0   . albuterol (PROVENTIL HFA;VENTOLIN HFA) 108 (90 BASE) MCG/ACT inhaler   Inhalation   Inhale 1-2 puffs into the lungs every 6 (six) hours as needed for wheezing.   1 Inhaler  0   . HYDROcodone-acetaminophen (NORCO/VICODIN) 5-325 MG per tablet   Oral   Take 1-2 tablets by mouth every 6 (six) hours as needed for pain.   14 tablet   0     BP 133/85  Pulse 73  Temp(Src) 98.2 F (36.8 C) (Oral)  Resp 20  Ht 5\' 9"  (1.753 m)  Wt 121 lb 9.6 oz (55.157 kg)  BMI 17.95 kg/m2  SpO2 96%  Physical Exam  Nursing note and vitals reviewed. Constitutional: He is oriented to person, place, and time. He appears well-developed and well-nourished. No distress.  HENT:  Head: Normocephalic and atraumatic.  Mouth/Throat: Oropharynx is clear and moist.  Eyes: Conjunctivae and EOM are normal. Pupils are equal, round, and reactive to light.  Neck: Normal range of motion. Neck supple.   Cardiovascular: Normal rate, regular rhythm and normal heart sounds.   No murmur heard. Abdominal: Soft. Bowel sounds are normal. There is tenderness.  Mild to moderate tenderness of right upper quadrant.  Musculoskeletal: Normal range of motion. He exhibits no edema.  Neurological: He is alert and oriented to person, place, and time. No cranial nerve deficit. He exhibits normal muscle tone. Coordination normal.  Skin: Skin is warm. No rash noted.    ED Course  Procedures (including critical care time)  Labs Reviewed  CBC WITH DIFFERENTIAL - Abnormal; Notable for the following:    RBC 3.75 (*)    HCT 38.7 (*)    MCV 103.2 (*)    MCH 35.5 (*)    All other components within normal limits  COMPREHENSIVE METABOLIC PANEL - Abnormal; Notable for the following:    Glucose, Bld 100 (*)    Creatinine, Ser 0.45 (*)    AST 41 (*)    All other components within normal limits  LIPASE, BLOOD   No results found. Results for orders placed during the hospital encounter of 04/13/13  CBC WITH DIFFERENTIAL      Result Value Range   WBC 7.6  4.0 - 10.5 K/uL   RBC 3.75 (*) 4.22 - 5.81 MIL/uL   Hemoglobin 13.3  13.0 - 17.0 g/dL   HCT 16.1 (*) 09.6 - 04.5 %   MCV 103.2 (*) 78.0 - 100.0 fL   MCH 35.5 (*) 26.0 - 34.0 pg   MCHC 34.4  30.0 - 36.0 g/dL   RDW 40.9  81.1 - 91.4 %   Platelets 221  150 - 400 K/uL   Neutrophils Relative % 44  43 - 77 %   Neutro Abs 3.3  1.7 - 7.7 K/uL   Lymphocytes Relative 45  12 - 46 %   Lymphs Abs 3.5  0.7 - 4.0 K/uL   Monocytes Relative 10  3 - 12 %   Monocytes Absolute 0.8  0.1 - 1.0 K/uL   Eosinophils Relative 1  0 - 5 %   Eosinophils Absolute 0.1  0.0 - 0.7 K/uL   Basophils Relative 0  0 - 1 %   Basophils Absolute 0.0  0.0 - 0.1 K/uL  COMPREHENSIVE METABOLIC PANEL      Result Value Range   Sodium 137  135 - 145 mEq/L   Potassium 3.7  3.5 - 5.1 mEq/L   Chloride 98  96 - 112 mEq/L   CO2 26  19 - 32 mEq/L   Glucose, Bld 100 (*) 70 - 99 mg/dL   BUN 6  6 -  23 mg/dL   Creatinine, Ser 7.82 (*) 0.50 - 1.35 mg/dL  Calcium 8.9  8.4 - 10.5 mg/dL   Total Protein 7.2  6.0 - 8.3 g/dL   Albumin 3.9  3.5 - 5.2 g/dL   AST 41 (*) 0 - 37 U/L   ALT 23  0 - 53 U/L   Alkaline Phosphatase 108  39 - 117 U/L   Total Bilirubin 0.4  0.3 - 1.2 mg/dL   GFR calc non Af Amer >90  >90 mL/min   GFR calc Af Amer >90  >90 mL/min  LIPASE, BLOOD      Result Value Range   Lipase 13  11 - 59 U/L     1. Abdominal pain       MDM  The patient with history of pancreatitis. However today lipase is not elevated that his pain is similar to his pancreatitis pain. Improved with the pain medication here no nausea vomiting tolerating food and liquid diet fine. Will discharge home with pain medicine followup with his primary care doctor and recommend clear liquids for the next 24 hours and then a bland diet. Patient will return for any newer worse symptoms. No significant leukocytosis no significant liver function test abnormalities.        Shelda Jakes, MD 04/13/13 2204

## 2013-04-24 ENCOUNTER — Encounter (HOSPITAL_COMMUNITY): Payer: Self-pay

## 2013-04-24 ENCOUNTER — Emergency Department (HOSPITAL_COMMUNITY)
Admission: EM | Admit: 2013-04-24 | Discharge: 2013-04-24 | Disposition: A | Discharge status: Home / Self Care | Payer: Medicaid Other | Attending: Emergency Medicine | Admitting: Emergency Medicine

## 2013-04-24 DIAGNOSIS — R220 Localized swelling, mass and lump, head: Secondary | ICD-10-CM | POA: Insufficient documentation

## 2013-04-24 DIAGNOSIS — F17200 Nicotine dependence, unspecified, uncomplicated: Secondary | ICD-10-CM | POA: Insufficient documentation

## 2013-04-24 DIAGNOSIS — Z8701 Personal history of pneumonia (recurrent): Secondary | ICD-10-CM | POA: Insufficient documentation

## 2013-04-24 DIAGNOSIS — J449 Chronic obstructive pulmonary disease, unspecified: Secondary | ICD-10-CM | POA: Insufficient documentation

## 2013-04-24 DIAGNOSIS — R0609 Other forms of dyspnea: Secondary | ICD-10-CM | POA: Insufficient documentation

## 2013-04-24 DIAGNOSIS — Z8679 Personal history of other diseases of the circulatory system: Secondary | ICD-10-CM | POA: Insufficient documentation

## 2013-04-24 DIAGNOSIS — R0989 Other specified symptoms and signs involving the circulatory and respiratory systems: Secondary | ICD-10-CM | POA: Insufficient documentation

## 2013-04-24 DIAGNOSIS — J4489 Other specified chronic obstructive pulmonary disease: Secondary | ICD-10-CM | POA: Insufficient documentation

## 2013-04-24 DIAGNOSIS — IMO0002 Reserved for concepts with insufficient information to code with codable children: Secondary | ICD-10-CM | POA: Insufficient documentation

## 2013-04-24 DIAGNOSIS — G8929 Other chronic pain: Secondary | ICD-10-CM | POA: Insufficient documentation

## 2013-04-24 DIAGNOSIS — Z79899 Other long term (current) drug therapy: Secondary | ICD-10-CM | POA: Insufficient documentation

## 2013-04-24 DIAGNOSIS — G40909 Epilepsy, unspecified, not intractable, without status epilepticus: Secondary | ICD-10-CM | POA: Insufficient documentation

## 2013-04-24 DIAGNOSIS — T7840XA Allergy, unspecified, initial encounter: Secondary | ICD-10-CM

## 2013-04-24 DIAGNOSIS — Z862 Personal history of diseases of the blood and blood-forming organs and certain disorders involving the immune mechanism: Secondary | ICD-10-CM | POA: Insufficient documentation

## 2013-04-24 DIAGNOSIS — Z8719 Personal history of other diseases of the digestive system: Secondary | ICD-10-CM | POA: Insufficient documentation

## 2013-04-24 LAB — CBC WITH DIFFERENTIAL/PLATELET
Basophils Absolute: 0 10*3/uL (ref 0.0–0.1)
HCT: 44.3 % (ref 39.0–52.0)
Lymphs Abs: 7.8 10*3/uL — ABNORMAL HIGH (ref 0.7–4.0)
MCH: 36.5 pg — ABNORMAL HIGH (ref 26.0–34.0)
MCV: 106.5 fL — ABNORMAL HIGH (ref 78.0–100.0)
Monocytes Absolute: 1.2 10*3/uL — ABNORMAL HIGH (ref 0.1–1.0)
Monocytes Relative: 7 % (ref 3–12)
Neutro Abs: 7.7 10*3/uL (ref 1.7–7.7)
Platelets: 264 10*3/uL (ref 150–400)
RDW: 14.1 % (ref 11.5–15.5)

## 2013-04-24 LAB — BASIC METABOLIC PANEL
BUN: 3 mg/dL — ABNORMAL LOW (ref 6–23)
CO2: 26 mEq/L (ref 19–32)
Calcium: 8.9 mg/dL (ref 8.4–10.5)
Chloride: 91 mEq/L — ABNORMAL LOW (ref 96–112)
Creatinine, Ser: 0.79 mg/dL (ref 0.50–1.35)

## 2013-04-24 LAB — LACTIC ACID, PLASMA: Lactic Acid, Venous: 6 mmol/L — ABNORMAL HIGH (ref 0.5–2.2)

## 2013-04-24 MED ORDER — DIPHENHYDRAMINE HCL 50 MG/ML IJ SOLN
25.0000 mg | Freq: Once | INTRAMUSCULAR | Status: AC
  Administered 2013-04-24: 25 mg via INTRAVENOUS
  Filled 2013-04-24: qty 1

## 2013-04-24 MED ORDER — IPRATROPIUM BROMIDE 0.02 % IN SOLN
0.5000 mg | Freq: Once | RESPIRATORY_TRACT | Status: AC
  Administered 2013-04-24: 0.5 mg via RESPIRATORY_TRACT
  Filled 2013-04-24: qty 2.5

## 2013-04-24 MED ORDER — LORATADINE 10 MG PO TABS: 10.0000 mg | ORAL_TABLET | Freq: Every day | ORAL | Status: DC

## 2013-04-24 MED ORDER — SODIUM CHLORIDE 0.9 % IV SOLN
1000.0000 mL | Freq: Once | INTRAVENOUS | Status: AC
  Administered 2013-04-24: 1000 mL via INTRAVENOUS

## 2013-04-24 MED ORDER — POTASSIUM CHLORIDE 10 MEQ/100ML IV SOLN
10.0000 meq | Freq: Once | INTRAVENOUS | Status: AC
  Administered 2013-04-24: 10 meq via INTRAVENOUS
  Filled 2013-04-24: qty 100

## 2013-04-24 MED ORDER — METHYLPREDNISOLONE SODIUM SUCC 125 MG IJ SOLR
125.0000 mg | Freq: Once | INTRAMUSCULAR | Status: AC
  Administered 2013-04-24: 125 mg via INTRAVENOUS
  Filled 2013-04-24: qty 2

## 2013-04-24 MED ORDER — HYDROMORPHONE HCL PF 1 MG/ML IJ SOLN
1.0000 mg | Freq: Once | INTRAMUSCULAR | Status: AC
  Administered 2013-04-24: 1 mg via INTRAVENOUS
  Filled 2013-04-24: qty 1

## 2013-04-24 MED ORDER — FAMOTIDINE IN NACL 20-0.9 MG/50ML-% IV SOLN
20.0000 mg | Freq: Once | INTRAVENOUS | Status: AC
  Administered 2013-04-24: 20 mg via INTRAVENOUS
  Filled 2013-04-24: qty 50

## 2013-04-24 MED ORDER — POTASSIUM CHLORIDE CRYS ER 20 MEQ PO TBCR
40.0000 meq | EXTENDED_RELEASE_TABLET | Freq: Once | ORAL | Status: AC
  Administered 2013-04-24: 40 meq via ORAL
  Filled 2013-04-24: qty 2

## 2013-04-24 MED ORDER — ALBUTEROL SULFATE (5 MG/ML) 0.5% IN NEBU
2.5000 mg | INHALATION_SOLUTION | Freq: Once | RESPIRATORY_TRACT | Status: AC
  Administered 2013-04-24: 2.5 mg via RESPIRATORY_TRACT
  Filled 2013-04-24: qty 0.5

## 2013-04-24 MED ORDER — PREDNISONE 50 MG PO TABS: 50.0000 mg | ORAL_TABLET | Freq: Every day | ORAL | Status: DC

## 2013-04-24 MED ORDER — ONDANSETRON HCL 4 MG/2ML IJ SOLN
4.0000 mg | Freq: Once | INTRAMUSCULAR | Status: AC
  Administered 2013-04-24: 4 mg via INTRAVENOUS
  Filled 2013-04-24: qty 2

## 2013-04-24 MED ORDER — SODIUM CHLORIDE 0.9 % IV SOLN
1000.0000 mL | INTRAVENOUS | Status: DC
  Administered 2013-04-24: 1000 mL via INTRAVENOUS

## 2013-04-24 NOTE — ED Notes (Signed)
Pt states he took a half of a viagra about 30 minutes ago, states he started having severe itching, lips and oral swelling and sob.   Pt states he has taken this med before without problems

## 2013-04-24 NOTE — ED Provider Notes (Addendum)
History    CSN: [id number] Arrival date & time 04/24/13  0101  First MD Initiated Contact with Patient 04/24/13 0109     Chief Complaint  Patient presents with  . Allergic Reaction   (Consider location/radiation/quality/duration/timing/severity/associated sxs/prior Treatment) Patient is a 37 y.o. male presenting with allergic reaction. The history is provided by the patient.  Allergic Reaction He took a dose of Viagra and almost immediately developed swelling in his face and generalized itching. He has noted some dyspnea. There is no difficulty swallowing. He has never had a reaction like this before. Symptoms are severe. Nothing makes it better nothing makes it worse. He did not treat himself with anything before coming to the emergency department. He has taken Viagra before without any reactions like this. There is been no recent medication changes and he denies any unusual exposures. Past Medical History  Diagnosis Date  . Pancreatitis   . Seizures   . Back pain   . ETOH abuse   . COPD (chronic obstructive pulmonary disease)   . SAH (subarachnoid hemorrhage)   . Macrocytosis 08/17/2011  . Bradycardia 08/17/2011  . Pneumonia    Past Surgical History  Procedure Laterality Date  . Back surgery    . Septoplasty     Family History  Problem Relation Age of Onset  . Seizures Father   . Alcohol abuse Father   . Coronary artery disease Mother     deceased age 103   History  Substance Use Topics  . Smoking status: Current Every Day Smoker -- 1.00 packs/day for 20 years    Types: Cigarettes  . Smokeless tobacco: Never Used  . Alcohol Use: Yes     Comment: occasionally    Review of Systems  All other systems reviewed and are negative.    Allergies  Review of patient's allergies indicates no known allergies.  Home Medications   Current Outpatient Rx  Name  Route  Sig  Dispense  Refill  . albuterol (PROVENTIL HFA;VENTOLIN HFA) 108 (90 BASE) MCG/ACT inhaler  Inhalation   Inhale 1-2 puffs into the lungs every 6 (six) hours as needed for wheezing.   1 Inhaler   0   . carbamazepine (TEGRETOL) 200 MG tablet   Oral   Take 200-400 mg by mouth 3 (three) times daily. Take 2 tablets in the morning, 1 tablet at lunch if needed, 2 tablets in the evening         . HYDROcodone-acetaminophen (NORCO/VICODIN) 5-325 MG per tablet   Oral   Take 1-2 tablets by mouth every 6 (six) hours as needed for pain.   14 tablet   0   . ipratropium (ATROVENT) 0.02 % nebulizer solution   Nebulization   Take 2.5 mLs (0.5 mg total) by nebulization every 6 (six) hours.   75 mL   5   . levETIRAcetam (KEPPRA) 500 MG tablet   Oral   Take 500 mg by mouth 2 (two) times daily.          Marland Kitchen oxyCODONE-acetaminophen (PERCOCET) 10-325 MG per tablet   Oral   Take 1 tablet by mouth every 4 (four) hours as needed for pain.   30 tablet   0    BP 121/80  Pulse 86  Resp 13  SpO2 100% Physical Exam  Nursing note and vitals reviewed.  37 year old male, who appears uncomfortable and it is in mild respiratory distress. He is using some accessory muscles of respiration. Vital signs are normal. Oxygen saturation  is 100%, which is normal. Head is normocephalic and atraumatic. PERRLA, EOMI. Oropharynx is clear. Mild swelling is noted of the lips and periorbital areas. There is no swelling of the tongue, uvula, or sublingual tissue. There is no stridor and has had difficulty with secretions. Phonation is normal. Neck is nontender and supple without adenopathy or JVD. Back is nontender and there is no CVA tenderness. Lungs have faint wheezes. There no rales or rhonchi. Intercostal retractions are present. Chest is nontender. Heart has regular rate and rhythm without murmur. Abdomen is soft, flat, nontender without masses or hepatosplenomegaly and peristalsis is normoactive. Extremities have no cyanosis or edema, full range of motion is present. Skin is warm and dry. Erythema is  noted over his feet and legs. No urticaria is seen. Neurologic: Mental status is normal, cranial nerves are intact, there are no motor or sensory deficits.  ED Course  Procedures (including critical care time) Results for orders placed during the hospital encounter of 04/24/13  CBC WITH DIFFERENTIAL      Result Value Range   WBC 16.9 (*) 4.0 - 10.5 K/uL   RBC 4.16 (*) 4.22 - 5.81 MIL/uL   Hemoglobin 15.2  13.0 - 17.0 g/dL   HCT 16.1  09.6 - 04.5 %   MCV 106.5 (*) 78.0 - 100.0 fL   MCH 36.5 (*) 26.0 - 34.0 pg   MCHC 34.3  30.0 - 36.0 g/dL   RDW 40.9  81.1 - 91.4 %   Platelets 264  150 - 400 K/uL   Neutrophils Relative % 46  43 - 77 %   Lymphocytes Relative 46  12 - 46 %   Monocytes Relative 7  3 - 12 %   Eosinophils Relative 1  0 - 5 %   Basophils Relative 0  0 - 1 %   Neutro Abs 7.7  1.7 - 7.7 K/uL   Lymphs Abs 7.8 (*) 0.7 - 4.0 K/uL   Monocytes Absolute 1.2 (*) 0.1 - 1.0 K/uL   Eosinophils Absolute 0.2  0.0 - 0.7 K/uL   Basophils Absolute 0.0  0.0 - 0.1 K/uL   WBC Morphology TOXIC GRANULATION    BASIC METABOLIC PANEL      Result Value Range   Sodium 134 (*) 135 - 145 mEq/L   Potassium 2.7 (*) 3.5 - 5.1 mEq/L   Chloride 91 (*) 96 - 112 mEq/L   CO2 26  19 - 32 mEq/L   Glucose, Bld 161 (*) 70 - 99 mg/dL   BUN 3 (*) 6 - 23 mg/dL   Creatinine, Ser 7.82  0.50 - 1.35 mg/dL   Calcium 8.9  8.4 - 95.6 mg/dL   GFR calc non Af Amer >90  >90 mL/min   GFR calc Af Amer >90  >90 mL/min  LACTIC ACID, PLASMA      Result Value Range   Lactic Acid, Venous 6.0 (*) 0.5 - 2.2 mmol/L    Date: 04/24/2013  Rate: 88  Rhythm: normal sinus rhythm  QRS Axis: normal  Intervals: normal  ST/T Wave abnormalities: normal  Conduction Disutrbances:none  Narrative Interpretation: Right atrial hypertrophy, possible old anteroseptal myocardial infarction. When compared with ECG of 05/28/2012, no significant changes are seen  Old EKG Reviewed: unchanged    1. Allergic reaction to drug   2. Chronic  pain    CRITICAL CARE Performed by: OZHYQ,MVHQI Total critical care time: 45 minutes Critical care time was exclusive of separately billable procedures and treating other patients. Critical care was  necessary to treat or prevent imminent or life-threatening deterioration. Critical care was time spent personally by me on the following activities: development of treatment plan with patient and/or surrogate as well as nursing, discussions with consultants, evaluation of patient's response to treatment, examination of patient, obtaining history from patient or surrogate, ordering and performing treatments and interventions, ordering and review of laboratory studies, ordering and review of radiographic studies, pulse oximetry and re-evaluation of patient's condition.   MDM  Acute allergic reaction which is most likely due to Viagra. He will be treated with IV diphenhydramine, IV famotidine, IV methylprednisolone. He'll be given albuterol and ipratropium via nebulizer.  2:06 AM He states that the itching has improved and swelling seems to be going down and he does not feel as shaky as he felt before. He is complaining that he hurts all over and that he he usually gets a lot of 1 mg when he has pain like this. On exam, erythema has resolved and lips do not appear swollen although there is still some slight periorbital swelling. Lungs are clear and he is no longer using accessory muscles of respiration. Old records were reviewed and he has multiple ED visits for pancreatitis and other painful conditions and in deed is generally treated with hydromorphone and is given a dose today.  4:56 AM He continues to improve from the standpoint of his allergy. Selling is almost completely gone. He is felt stable for discharge at this point. He continues to complain of pain in review of his record on [place] controlled substance reporting website shows that he gets monthly prescriptions of 120  oxycodone-acetaminophen 10-325 tablets. He is given an additional dose of hydromorphone prior to discharge. He is discharged with prescriptions for prednisone and loratadine.  Dione Booze, MD 04/24/13 9604  Dione Booze, MD 04/24/13 [phone]

## 2013-04-24 NOTE — ED Notes (Signed)
CRITICAL VALUE ALERT  Critical value received:  Potassium 2.7  Date of notification:  04/24/13  Time of notification:  02:15  Critical value read back: Yes  Nurse who received alert:  Alda Lea, RN  Responding MD:  Dr. Preston Fleeting  Time MD responded:  02:23

## 2013-05-19 ENCOUNTER — Emergency Department (HOSPITAL_COMMUNITY): Payer: Medicaid Other

## 2013-05-19 ENCOUNTER — Emergency Department (HOSPITAL_COMMUNITY)
Admission: EM | Admit: 2013-05-19 | Discharge: 2013-05-20 | Disposition: A | Discharge status: Home / Self Care | Payer: Medicaid Other | Attending: Emergency Medicine | Admitting: Emergency Medicine

## 2013-05-19 ENCOUNTER — Encounter (HOSPITAL_COMMUNITY): Payer: Self-pay | Admitting: Emergency Medicine

## 2013-05-19 DIAGNOSIS — Y929 Unspecified place or not applicable: Secondary | ICD-10-CM | POA: Insufficient documentation

## 2013-05-19 DIAGNOSIS — Z862 Personal history of diseases of the blood and blood-forming organs and certain disorders involving the immune mechanism: Secondary | ICD-10-CM | POA: Insufficient documentation

## 2013-05-19 DIAGNOSIS — S298XXA Other specified injuries of thorax, initial encounter: Secondary | ICD-10-CM | POA: Insufficient documentation

## 2013-05-19 DIAGNOSIS — IMO0002 Reserved for concepts with insufficient information to code with codable children: Secondary | ICD-10-CM | POA: Insufficient documentation

## 2013-05-19 DIAGNOSIS — J4489 Other specified chronic obstructive pulmonary disease: Secondary | ICD-10-CM | POA: Insufficient documentation

## 2013-05-19 DIAGNOSIS — F1010 Alcohol abuse, uncomplicated: Secondary | ICD-10-CM | POA: Insufficient documentation

## 2013-05-19 DIAGNOSIS — I498 Other specified cardiac arrhythmias: Secondary | ICD-10-CM | POA: Insufficient documentation

## 2013-05-19 DIAGNOSIS — Z8719 Personal history of other diseases of the digestive system: Secondary | ICD-10-CM | POA: Insufficient documentation

## 2013-05-19 DIAGNOSIS — F17200 Nicotine dependence, unspecified, uncomplicated: Secondary | ICD-10-CM | POA: Insufficient documentation

## 2013-05-19 DIAGNOSIS — R0789 Other chest pain: Secondary | ICD-10-CM

## 2013-05-19 DIAGNOSIS — Z8679 Personal history of other diseases of the circulatory system: Secondary | ICD-10-CM | POA: Insufficient documentation

## 2013-05-19 DIAGNOSIS — R569 Unspecified convulsions: Secondary | ICD-10-CM | POA: Insufficient documentation

## 2013-05-19 DIAGNOSIS — J449 Chronic obstructive pulmonary disease, unspecified: Secondary | ICD-10-CM | POA: Insufficient documentation

## 2013-05-19 DIAGNOSIS — Z79899 Other long term (current) drug therapy: Secondary | ICD-10-CM | POA: Insufficient documentation

## 2013-05-19 DIAGNOSIS — Y939 Activity, unspecified: Secondary | ICD-10-CM | POA: Insufficient documentation

## 2013-05-19 DIAGNOSIS — Z8701 Personal history of pneumonia (recurrent): Secondary | ICD-10-CM | POA: Insufficient documentation

## 2013-05-19 MED ORDER — IBUPROFEN 800 MG PO TABS
800.0000 mg | ORAL_TABLET | Freq: Once | ORAL | Status: AC
  Administered 2013-05-20: 800 mg via ORAL
  Filled 2013-05-19: qty 1

## 2013-05-19 NOTE — ED Notes (Signed)
Patient reports was attempting to cut and pull limbs off of a tree a week ago. States cutters that he was using to cut the limb pushed into his chest. States has had knot to right side of chest accompanied by pain for a week since the incident.

## 2013-05-20 NOTE — ED Provider Notes (Addendum)
History    CSN: [id number] Arrival date & time 05/19/13  2221  First MD Initiated Contact with Patient 05/19/13 2310     Chief Complaint  Patient presents with  . Chest Pain   (Consider location/radiation/quality/duration/timing/severity/associated sxs/prior Treatment) HPI HPI Comments: [name] is a 37 y.o. male who presents to the Emergency Department complaining of pain to the chest after cutting down a limb which hit him as it fell. This event occurred a week ago and he has continued to have pain.   PCp Dr. Janna Arch Past Medical History  Diagnosis Date  . Pancreatitis   . Seizures   . Back pain   . ETOH abuse   . COPD (chronic obstructive pulmonary disease)   . SAH (subarachnoid hemorrhage)   . Macrocytosis 08/17/2011  . Bradycardia 08/17/2011  . Pneumonia    Past Surgical History  Procedure Laterality Date  . Back surgery    . Septoplasty     Family History  Problem Relation Age of Onset  . Seizures Father   . Alcohol abuse Father   . Coronary artery disease Mother     deceased age 27   History  Substance Use Topics  . Smoking status: Current Every Day Smoker -- 1.00 packs/day for 20 years    Types: Cigarettes  . Smokeless tobacco: Never Used  . Alcohol Use: Yes     Comment: occasionally    Review of Systems  Constitutional: Negative for fever.       10 Systems reviewed and are negative for acute change except as noted in the HPI.  HENT: Negative for congestion.   Eyes: Negative for discharge and redness.  Respiratory: Negative for cough and shortness of breath.   Cardiovascular: Negative for chest pain.  Gastrointestinal: Negative for vomiting and abdominal pain.  Musculoskeletal: Negative for back pain.       Chest discomfort  Skin: Negative for rash.  Neurological: Negative for syncope, numbness and headaches.  Psychiatric/Behavioral:       No behavior change.    Allergies  Review of patient's allergies indicates no known  allergies.  Home Medications   Current Outpatient Rx  Name  Route  Sig  Dispense  Refill  . albuterol (PROVENTIL HFA;VENTOLIN HFA) 108 (90 BASE) MCG/ACT inhaler   Inhalation   Inhale 1-2 puffs into the lungs every 6 (six) hours as needed for wheezing.   1 Inhaler   0   . carbamazepine (TEGRETOL) 200 MG tablet   Oral   Take 200-400 mg by mouth 3 (three) times daily. Take 2 tablets in the morning, 1 tablet at lunch if needed, 2 tablets in the evening         . HYDROcodone-acetaminophen (NORCO/VICODIN) 5-325 MG per tablet   Oral   Take 1-2 tablets by mouth every 6 (six) hours as needed for pain.   14 tablet   0   . ipratropium (ATROVENT) 0.02 % nebulizer solution   Nebulization   Take 2.5 mLs (0.5 mg total) by nebulization every 6 (six) hours.   75 mL   5   . levETIRAcetam (KEPPRA) 500 MG tablet   Oral   Take 500 mg by mouth 2 (two) times daily.          Marland Kitchen loratadine (CLARITIN) 10 MG tablet   Oral   Take 1 tablet (10 mg total) by mouth daily.   15 tablet   0   . oxyCODONE-acetaminophen (PERCOCET) 10-325 MG per tablet   Oral  Take 1 tablet by mouth every 4 (four) hours as needed for pain.   30 tablet   0   . predniSONE (DELTASONE) 50 MG tablet   Oral   Take 1 tablet (50 mg total) by mouth daily.   5 tablet   0    BP 124/69  Pulse 109  Temp(Src) 98.6 F (37 C) (Oral)  Resp 22  Ht 5\' 9"  (1.753 m)  Wt 120 lb (54.432 kg)  BMI 17.71 kg/m2  SpO2 97% Physical Exam  Nursing note and vitals reviewed. Constitutional: He appears well-developed.  Awake, alert, nontoxic appearance.  HENT:  Head: Atraumatic.  Eyes: Right eye exhibits no discharge. Left eye exhibits no discharge.  Neck: Neck supple.  Pulmonary/Chest: Effort normal. He exhibits tenderness.  No bruising to the right side of his chest. Lung sounds are clear.   Abdominal: Soft. There is no tenderness. There is no rebound.  Musculoskeletal: He exhibits no tenderness.  Baseline ROM, no obvious new  focal weakness.  Neurological:  Mental status and motor strength appears baseline for patient and situation.  Skin: No rash noted.  Psychiatric: He has a normal mood and affect.    ED Course  Procedures (including critical care time) Labs Reviewed - No data to display Dg Chest 2 View  05/19/2013   *RADIOLOGY REPORT*  Clinical Data: Right-sided chest pain for 1 week.  CHEST - 2 VIEW  Comparison: 02/22/2013  Findings: Normal heart size and pulmonary vascularity. Emphysematous changes and scattered fibrosis in the lungs.  Focal scarring in the right upper lung and left apex.  Central interstitial changes suggest chronic bronchitis.  No focal airspace disease or consolidation.  No blunting of costophrenic angles.  No pneumothorax.  Old right rib fractures.  The vertebral compression deformities, stable since previous study.  IMPRESSION: No evidence of active pulmonary disease.  Stable chronic changes including emphysema, chronic bronchitis, and scarring in the upper lungs.   Original Report Authenticated By: Burman Nieves, M.D.   Dg Ribs Unilateral Right  05/19/2013   *RADIOLOGY REPORT*  Clinical Data: Right-sided chest pain.  RIGHT RIBS - 2 VIEW  Comparison: Two-view chest x-ray of the same day.  Two-view chest x- ray 10/26/2012.  Findings: Deformities of the posterolateral right seventh and eighth ribs are stable.  No acute or healing fractures are present. There is no pneumothorax.  Emphysematous changes are stable.  IMPRESSION:  1.  No acute or healing fractures.   Original Report Authenticated By: Marin Roberts, M.D.    Date: 05/19/2013   2235  Rate: 71  Rhythm: normal sinus rhythm with sinus arrhythmia  QRS Axis: normal  Intervals: normal  ST/T Wave abnormalities: normal  Conduction Disutrbances: none  Narrative Interpretation: unremarkable     1. Chest wall pain     MDM  Patient with chest pain for a week since cutting down limbs and having one hit him in the  chest. Xray  is negative for acute injury. Reviewed results with patient.Pt stable in ED with no significant deterioration in condition.The patient appears reasonably screened and/or stabilized for discharge and I doubt any other medical condition or other [facility] requiring further screening, evaluation, or treatment in the ED at this time prior to discharge.  MDM Reviewed: nursing note and vitals Interpretation: x-ray     Nicoletta Dress. Colon Branch, MD 05/20/13 0014  Nicoletta Dress. Colon Branch, MD 05/20/13 0981

## 2013-06-03 ENCOUNTER — Inpatient Hospital Stay (HOSPITAL_COMMUNITY)
Admission: EM | Admit: 2013-06-03 | Discharge: 2013-06-06 | DRG: 439 | Disposition: A | Discharge status: Home / Self Care | Payer: Medicaid Other | Attending: Family Medicine | Admitting: Family Medicine

## 2013-06-03 ENCOUNTER — Inpatient Hospital Stay (HOSPITAL_COMMUNITY): Payer: Medicaid Other

## 2013-06-03 ENCOUNTER — Encounter (HOSPITAL_COMMUNITY): Payer: Self-pay

## 2013-06-03 DIAGNOSIS — F10239 Alcohol dependence with withdrawal, unspecified: Secondary | ICD-10-CM | POA: Diagnosis present

## 2013-06-03 DIAGNOSIS — F1010 Alcohol abuse, uncomplicated: Secondary | ICD-10-CM | POA: Diagnosis present

## 2013-06-03 DIAGNOSIS — Z720 Tobacco use: Secondary | ICD-10-CM | POA: Diagnosis present

## 2013-06-03 DIAGNOSIS — E871 Hypo-osmolality and hyponatremia: Secondary | ICD-10-CM | POA: Diagnosis present

## 2013-06-03 DIAGNOSIS — K859 Acute pancreatitis without necrosis or infection, unspecified: Principal | ICD-10-CM | POA: Diagnosis present

## 2013-06-03 DIAGNOSIS — K852 Alcohol induced acute pancreatitis without necrosis or infection: Secondary | ICD-10-CM | POA: Diagnosis present

## 2013-06-03 DIAGNOSIS — Z79899 Other long term (current) drug therapy: Secondary | ICD-10-CM

## 2013-06-03 DIAGNOSIS — J449 Chronic obstructive pulmonary disease, unspecified: Secondary | ICD-10-CM | POA: Diagnosis present

## 2013-06-03 DIAGNOSIS — J4489 Other specified chronic obstructive pulmonary disease: Secondary | ICD-10-CM

## 2013-06-03 DIAGNOSIS — G40909 Epilepsy, unspecified, not intractable, without status epilepticus: Secondary | ICD-10-CM | POA: Diagnosis present

## 2013-06-03 DIAGNOSIS — R110 Nausea: Secondary | ICD-10-CM

## 2013-06-03 DIAGNOSIS — J441 Chronic obstructive pulmonary disease with (acute) exacerbation: Secondary | ICD-10-CM | POA: Diagnosis present

## 2013-06-03 DIAGNOSIS — F10939 Alcohol use, unspecified with withdrawal, unspecified: Secondary | ICD-10-CM | POA: Diagnosis present

## 2013-06-03 DIAGNOSIS — F1020 Alcohol dependence, uncomplicated: Secondary | ICD-10-CM | POA: Diagnosis present

## 2013-06-03 DIAGNOSIS — K7010 Alcoholic hepatitis without ascites: Secondary | ICD-10-CM | POA: Diagnosis present

## 2013-06-03 DIAGNOSIS — F17200 Nicotine dependence, unspecified, uncomplicated: Secondary | ICD-10-CM | POA: Diagnosis present

## 2013-06-03 LAB — MAGNESIUM: Magnesium: 1.9 mg/dL (ref 1.5–2.5)

## 2013-06-03 LAB — COMPREHENSIVE METABOLIC PANEL
ALT: 26 U/L (ref 0–53)
AST: 62 U/L — ABNORMAL HIGH (ref 0–37)
Alkaline Phosphatase: 165 U/L — ABNORMAL HIGH (ref 39–117)
CO2: 25 mEq/L (ref 19–32)
Calcium: 9.4 mg/dL (ref 8.4–10.5)
GFR calc Af Amer: >90 mL/min (ref >90)
GFR calc non Af Amer: >90 mL/min (ref >90)
Glucose, Bld: 98 mg/dL (ref 70–99)
Potassium: 4.3 mEq/L (ref 3.5–5.1)
Sodium: 131 mEq/L — ABNORMAL LOW (ref 135–145)
Total Protein: 8.5 g/dL — ABNORMAL HIGH (ref 6.0–8.3)

## 2013-06-03 LAB — CBC WITH DIFFERENTIAL/PLATELET
Basophils Relative: 0 % (ref 0–1)
Eosinophils Absolute: 0.1 10*3/uL (ref 0.0–0.7)
Eosinophils Relative: 1 % (ref 0–5)
Hemoglobin: 15.9 g/dL (ref 13.0–17.0)
Lymphs Abs: 3.5 10*3/uL (ref 0.7–4.0)
MCH: 37.2 pg — ABNORMAL HIGH (ref 26.0–34.0)
MCHC: 34.6 g/dL (ref 30.0–36.0)
MCV: 107.5 fL — ABNORMAL HIGH (ref 78.0–100.0)
Monocytes Relative: 10 % (ref 3–12)
RBC: 4.27 MIL/uL (ref 4.22–5.81)

## 2013-06-03 LAB — MRSA PCR SCREENING: MRSA by PCR: NEGATIVE

## 2013-06-03 MED ORDER — ONDANSETRON HCL 4 MG PO TABS: 4.0000 mg | ORAL_TABLET | Freq: Four times a day (QID) | ORAL | Status: DC | PRN

## 2013-06-03 MED ORDER — IPRATROPIUM BROMIDE 0.02 % IN SOLN: 0.5000 mg | Freq: Four times a day (QID) | RESPIRATORY_TRACT | Status: DC | PRN

## 2013-06-03 MED ORDER — LORAZEPAM 1 MG PO TABS
1.0000 mg | ORAL_TABLET | Freq: Four times a day (QID) | ORAL | Status: AC | PRN
  Administered 2013-06-04 – 2013-06-05 (×5): 1 mg via ORAL
  Filled 2013-06-03 (×5): qty 1

## 2013-06-03 MED ORDER — HYDROMORPHONE HCL PF 1 MG/ML IJ SOLN
1.0000 mg | INTRAMUSCULAR | Status: DC | PRN
  Filled 2013-06-03 (×3): qty 1

## 2013-06-03 MED ORDER — CARBAMAZEPINE 200 MG PO TABS
400.0000 mg | ORAL_TABLET | Freq: Two times a day (BID) | ORAL | Status: DC
  Administered 2013-06-03 – 2013-06-06 (×6): 400 mg via ORAL
  Filled 2013-06-03 (×5): qty 2

## 2013-06-03 MED ORDER — SODIUM CHLORIDE 0.9 % IV SOLN
INTRAVENOUS | Status: DC
  Filled 2013-06-03 (×2): qty 1000

## 2013-06-03 MED ORDER — METHYLPREDNISOLONE SODIUM SUCC 125 MG IJ SOLR
125.0000 mg | Freq: Three times a day (TID) | INTRAMUSCULAR | Status: DC
  Administered 2013-06-04 – 2013-06-05 (×4): 125 mg via INTRAVENOUS
  Filled 2013-06-03 (×5): qty 2

## 2013-06-03 MED ORDER — HYDROMORPHONE HCL PF 2 MG/ML IJ SOLN
2.0000 mg | Freq: Once | INTRAMUSCULAR | Status: AC
  Administered 2013-06-03: 2 mg via INTRAVENOUS
  Filled 2013-06-03: qty 1

## 2013-06-03 MED ORDER — ONDANSETRON HCL 4 MG/2ML IJ SOLN
4.0000 mg | Freq: Once | INTRAMUSCULAR | Status: AC
  Administered 2013-06-03: 4 mg via INTRAVENOUS
  Filled 2013-06-03: qty 2

## 2013-06-03 MED ORDER — VITAMIN B-1 100 MG PO TABS
100.0000 mg | ORAL_TABLET | Freq: Every day | ORAL | Status: DC
  Administered 2013-06-03 – 2013-06-06 (×4): 100 mg via ORAL
  Filled 2013-06-03 (×4): qty 1

## 2013-06-03 MED ORDER — CARBAMAZEPINE 200 MG PO TABS
200.0000 mg | ORAL_TABLET | Freq: Three times a day (TID) | ORAL | Status: DC
  Administered 2013-06-03: 400 mg via ORAL

## 2013-06-03 MED ORDER — HYDROMORPHONE HCL PF 1 MG/ML IJ SOLN
2.0000 mg | INTRAMUSCULAR | Status: AC | PRN
  Administered 2013-06-03 (×3): 2 mg via INTRAVENOUS

## 2013-06-03 MED ORDER — CARBAMAZEPINE 200 MG PO TABS
400.0000 mg | ORAL_TABLET | Freq: Once | ORAL | Status: AC
  Administered 2013-06-03: 400 mg via ORAL
  Filled 2013-06-03: qty 2

## 2013-06-03 MED ORDER — METHYLPREDNISOLONE SODIUM SUCC 125 MG IJ SOLR
INTRAMUSCULAR | Status: AC
  Administered 2013-06-03: 125 mg via INTRAVENOUS
  Filled 2013-06-03: qty 2

## 2013-06-03 MED ORDER — ONDANSETRON HCL 4 MG/2ML IJ SOLN: 4.0000 mg | Freq: Four times a day (QID) | INTRAMUSCULAR | Status: AC | PRN

## 2013-06-03 MED ORDER — FOLIC ACID 5 MG/ML IJ SOLN
INTRAMUSCULAR | Status: AC
  Filled 2013-06-03: qty 0.2

## 2013-06-03 MED ORDER — ONDANSETRON HCL 4 MG/2ML IJ SOLN: 4.0000 mg | Freq: Four times a day (QID) | INTRAMUSCULAR | Status: DC | PRN

## 2013-06-03 MED ORDER — CARBAMAZEPINE 200 MG PO TABS
200.0000 mg | ORAL_TABLET | Freq: Every day | ORAL | Status: DC | PRN
  Filled 2013-06-03: qty 1

## 2013-06-03 MED ORDER — THIAMINE HCL 100 MG/ML IJ SOLN: 100.0000 mg | Freq: Every day | INTRAMUSCULAR | Status: DC

## 2013-06-03 MED ORDER — SODIUM CHLORIDE 0.9 % IV BOLUS (SEPSIS)
1000.0000 mL | Freq: Once | INTRAVENOUS | Status: AC
  Administered 2013-06-03: 1000 mL via INTRAVENOUS

## 2013-06-03 MED ORDER — ADULT MULTIVITAMIN W/MINERALS CH
1.0000 | ORAL_TABLET | Freq: Every day | ORAL | Status: DC
  Administered 2013-06-03 – 2013-06-06 (×4): 1 via ORAL
  Filled 2013-06-03 (×4): qty 1

## 2013-06-03 MED ORDER — LACTATED RINGERS IV SOLN: INTRAVENOUS | Status: DC

## 2013-06-03 MED ORDER — HYDROMORPHONE HCL PF 1 MG/ML IJ SOLN
1.0000 mg | INTRAMUSCULAR | Status: DC | PRN
  Administered 2013-06-03 – 2013-06-04 (×10): 1 mg via INTRAVENOUS
  Filled 2013-06-03 (×5): qty 1
  Filled 2013-06-03 (×3): qty 2
  Filled 2013-06-03 (×3): qty 1

## 2013-06-03 MED ORDER — IPRATROPIUM-ALBUTEROL 0.5-2.5 (3) MG/3ML IN SOLN: 3.0000 mL | RESPIRATORY_TRACT | Status: DC

## 2013-06-03 MED ORDER — LORATADINE 10 MG PO TABS
10.0000 mg | ORAL_TABLET | Freq: Every day | ORAL | Status: DC
  Administered 2013-06-03 – 2013-06-06 (×4): 10 mg via ORAL
  Filled 2013-06-03 (×4): qty 1

## 2013-06-03 MED ORDER — FOLIC ACID 1 MG PO TABS
1.0000 mg | ORAL_TABLET | Freq: Every day | ORAL | Status: DC
  Administered 2013-06-03 – 2013-06-06 (×4): 1 mg via ORAL
  Filled 2013-06-03 (×4): qty 1

## 2013-06-03 MED ORDER — IPRATROPIUM BROMIDE 0.02 % IN SOLN
0.5000 mg | RESPIRATORY_TRACT | Status: DC
  Administered 2013-06-03 – 2013-06-06 (×18): 0.5 mg via RESPIRATORY_TRACT
  Filled 2013-06-03 (×19): qty 2.5

## 2013-06-03 MED ORDER — ENOXAPARIN SODIUM 40 MG/0.4ML ~~LOC~~ SOLN
40.0000 mg | SUBCUTANEOUS | Status: DC
  Administered 2013-06-03 – 2013-06-06 (×4): 40 mg via SUBCUTANEOUS
  Filled 2013-06-03 (×4): qty 0.4

## 2013-06-03 MED ORDER — HYDROMORPHONE HCL PF 1 MG/ML IJ SOLN
1.0000 mg | Freq: Once | INTRAMUSCULAR | Status: AC
  Administered 2013-06-03: 1 mg via INTRAVENOUS
  Filled 2013-06-03: qty 1

## 2013-06-03 MED ORDER — SODIUM CHLORIDE 0.9 % IV SOLN
INTRAVENOUS | Status: DC
  Administered 2013-06-03 – 2013-06-06 (×9): via INTRAVENOUS

## 2013-06-03 MED ORDER — LEVETIRACETAM 500 MG PO TABS
500.0000 mg | ORAL_TABLET | Freq: Two times a day (BID) | ORAL | Status: DC
  Administered 2013-06-03 – 2013-06-06 (×7): 500 mg via ORAL
  Filled 2013-06-03 (×7): qty 1

## 2013-06-03 MED ORDER — ALBUTEROL SULFATE (5 MG/ML) 0.5% IN NEBU
2.5000 mg | INHALATION_SOLUTION | RESPIRATORY_TRACT | Status: DC
  Administered 2013-06-03 – 2013-06-06 (×18): 2.5 mg via RESPIRATORY_TRACT
  Filled 2013-06-03 (×19): qty 0.5

## 2013-06-03 MED ORDER — LORAZEPAM 2 MG/ML IJ SOLN
1.0000 mg | Freq: Four times a day (QID) | INTRAMUSCULAR | Status: AC | PRN
  Administered 2013-06-03 – 2013-06-04 (×4): 1 mg via INTRAVENOUS
  Filled 2013-06-03 (×4): qty 1

## 2013-06-03 MED ORDER — SODIUM CHLORIDE 0.9 % IV SOLN
INTRAVENOUS | Status: DC
  Administered 2013-06-03: 05:00:00 via INTRAVENOUS

## 2013-06-03 MED ORDER — SODIUM CHLORIDE 0.9 % IV SOLN
1.0000 mg | Freq: Once | INTRAVENOUS | Status: AC
  Administered 2013-06-03: 1 mg via INTRAVENOUS
  Filled 2013-06-03: qty 0.2

## 2013-06-03 MED ORDER — LACTATED RINGERS IV BOLUS (SEPSIS)
1000.0000 mL | Freq: Once | INTRAVENOUS | Status: AC
  Administered 2013-06-03: 1000 mL via INTRAVENOUS

## 2013-06-03 NOTE — ED Notes (Signed)
Chronic pancreatitis, continues to drink alcohol, states pain started approx 8 pm last night.

## 2013-06-03 NOTE — ED Notes (Signed)
Patient was given a cup of ice chips and requesting pain medication at this time. Explained to patient that Dr is with another patient and that he will come back to see him as soon as he gets a opportunity.

## 2013-06-03 NOTE — ED Provider Notes (Signed)
CSN: [id number]     Arrival date & time 06/03/13  0050 History     First MD Initiated Contact with Patient 06/03/13 0135     Chief Complaint  Patient presents with  . Abdominal Pain   (Consider location/radiation/quality/duration/timing/severity/associated sxs/prior Treatment) Patient is a 37 y.o. male presenting with abdominal pain.  Abdominal Pain [name] is a 37 y.o. male history of alcoholism chronic pancreatitis presents with pancreatitis after drinking alcohol. She also has history of seizures, subarachnoid hemorrhage, COPD, does not complaining about chest pain, numbness, tingling, headache, fevers, chills, shortness of breath. Patient's pain is severe, it's been getting worse, is in the epigastrium, does not radiate, it is sharp, is associated nausea no vomiting, diarrhea. Patient says it feels typical of his pancreatitis pain. Past Medical History  Diagnosis Date  . Pancreatitis   . Seizures   . Back pain   . ETOH abuse   . COPD (chronic obstructive pulmonary disease)   . SAH (subarachnoid hemorrhage)   . Macrocytosis 08/17/2011  . Bradycardia 08/17/2011  . Pneumonia    Past Surgical History  Procedure Laterality Date  . Back surgery    . Septoplasty     Family History  Problem Relation Age of Onset  . Seizures Father   . Alcohol abuse Father   . Coronary artery disease Mother     deceased age 48   History  Substance Use Topics  . Smoking status: Current Every Day Smoker -- 1.00 packs/day for 20 years    Types: Cigarettes  . Smokeless tobacco: Never Used  . Alcohol Use: 25.2 oz/week    42 Cans of beer per week     Comment: occasionally    Review of Systems  Gastrointestinal: Positive for abdominal pain.   At least 10pt or greater review of systems completed and are negative except where specified in the HPI.  Allergies  Review of patient's allergies indicates no known allergies.  Home Medications  No current outpatient prescriptions on  file. BP 157/89  Pulse 63  Temp(Src) 97.4 F (36.3 C) (Oral)  Resp 20  Ht 5\' 9"  (1.753 m)  Wt 120 lb (54.432 kg)  BMI 17.71 kg/m2  SpO2 94% Physical Exam  Nursing notes reviewed.  Electronic medical record reviewed. VITAL SIGNS:   Filed Vitals:   06/03/13 0305 06/03/13 0403 06/03/13 0459 06/03/13 0600  BP: 144/94 153/77 131/77 157/89  Pulse: 94 60 62 63  Temp:    97.4 F (36.3 C)  TempSrc:    Oral  Resp:  20 20 20   Height:    5\' 9"  (1.753 m)  Weight:      SpO2: 92% 97% 93% 94%   CONSTITUTIONAL: Awake, oriented, appears non-toxic HENT: Atraumatic, normocephalic, oral mucosa pink and moist, airway patent. Nares patent without drainage. External ears normal. EYES: Conjunctiva clear, EOMI, PERRLA NECK: Trachea midline, non-tender, supple CARDIOVASCULAR: Normal heart rate, Normal rhythm, No murmurs, rubs, gallops PULMONARY/CHEST: Clear to auscultation, no rhonchi, wheezes, or rales. Symmetrical breath sounds. Non-tender. ABDOMINAL: Non-distended, soft, very tender to palpation epigastrium with voluntary guarding, no rebound tenderness.  BS normal. NEUROLOGIC: Non-focal, moving all four extremities, no gross sensory or motor deficits. EXTREMITIES: No clubbing, cyanosis, or edema SKIN: Warm, Dry, No erythema, No rash  ED Course   Procedures (including critical care time)  Labs Reviewed  COMPREHENSIVE METABOLIC PANEL - Abnormal; Notable for the following:    Sodium 131 (*)    Chloride 93 (*)    Creatinine, Ser 0.45 (*)  Total Protein 8.5 (*)    AST 62 (*)    Alkaline Phosphatase 165 (*)    All other components within normal limits  LIPASE, BLOOD - Abnormal; Notable for the following:    Lipase 270 (*)    All other components within normal limits  CBC WITH DIFFERENTIAL - Abnormal; Notable for the following:    WBC 11.1 (*)    MCV 107.5 (*)    MCH 37.2 (*)    Monocytes Absolute 1.1 (*)    All other components within normal limits   No results found. 1.  Pancreatitis, alcoholic, acute   2. Nausea   3. Alcohol abuse, continuous   4. COPD (chronic obstructive pulmonary disease)     MDM  Patient presents with pain he thinks is related to pancreatitis, obtain basic labs including a lipase showing a 270,  Slight elevation in AST, slight decrease in sodium and chloride. Patient's laboratory workup otherwise fairly unremarkable. Obtained abdominal x-ray showed no obstruction. Do not think patient requires CT imaging at this time.  Discussed with Dr. Rebbeca Paul for admission- able for admission chronic alcoholic pancreatitis  Jose Skene, MD 06/03/13 [phone]

## 2013-06-03 NOTE — ED Notes (Signed)
Pt requesting ice chips, ok with MD

## 2013-06-03 NOTE — ED Notes (Signed)
Pt advised of NPO status

## 2013-06-03 NOTE — Progress Notes (Signed)
Utilization Review Complete  

## 2013-06-03 NOTE — H&P (Addendum)
PCP:   Isabella Stalling, MD   Chief Complaint:  Abdominal pain  HPI: 37 year old male with a history of alcohol abuse, seizures, recurrent pancreatitis came to the ED today with abdominal pain which started today.  Patient says the pain is very severe, which does not radiate, he denies vomiting or diarrhea, admits to having nausea. Denies chest pain shortness of breath. Patient drinks 6-8 beers daily.   Allergies:  No Known Allergies    Past Medical History  Diagnosis Date  . Pancreatitis   . Seizures   . Back pain   . ETOH abuse   . COPD (chronic obstructive pulmonary disease)   . SAH (subarachnoid hemorrhage)   . Macrocytosis 08/17/2011  . Bradycardia 08/17/2011  . Pneumonia     Past Surgical History  Procedure Laterality Date  . Back surgery    . Septoplasty      Prior to Admission medications   Medication Sig Start Date End Date Taking? Authorizing Provider  albuterol (PROVENTIL HFA;VENTOLIN HFA) 108 (90 BASE) MCG/ACT inhaler Inhale 1-2 puffs into the lungs every 6 (six) hours as needed for wheezing. 10/19/12   Nicoletta Dress. Colon Branch, MD  carbamazepine (TEGRETOL) 200 MG tablet Take 200-400 mg by mouth 3 (three) times daily. Take 2 tablets in the morning, 1 tablet at lunch if needed, 2 tablets in the evening    Historical Provider, MD  HYDROcodone-acetaminophen (NORCO/VICODIN) 5-325 MG per tablet Take 1-2 tablets by mouth every 6 (six) hours as needed for pain. 04/13/13   Shelda Jakes, MD  ipratropium (ATROVENT) 0.02 % nebulizer solution Take 2.5 mLs (0.5 mg total) by nebulization every 6 (six) hours. 11/10/12   Isabella Stalling, MD  levETIRAcetam (KEPPRA) 500 MG tablet Take 500 mg by mouth 2 (two) times daily.     Historical Provider, MD  loratadine (CLARITIN) 10 MG tablet Take 1 tablet (10 mg total) by mouth daily. 04/24/13   Dione Booze, MD  oxyCODONE-acetaminophen (PERCOCET) 10-325 MG per tablet Take 1 tablet by mouth every 4 (four) hours as needed for pain. 11/10/12    Isabella Stalling, MD  predniSONE (DELTASONE) 50 MG tablet Take 1 tablet (50 mg total) by mouth daily. 04/24/13   Dione Booze, MD    Social History:  reports that he has been smoking Cigarettes.  He has a 20 pack-year smoking history. He has never used smokeless tobacco. He reports that  drinks alcohol. He reports that he does not use illicit drugs.  Family History  Problem Relation Age of Onset  . Seizures Father   . Alcohol abuse Father   . Coronary artery disease Mother     deceased age 54     All the positives are listed in BOLD  Review of Systems:  HEENT: Headache, blurred vision, runny nose, sore throat Neck: Hypothyroidism, hyperthyroidism,,lymphadenopathy Chest : Shortness of breath, history of COPD, Asthma Heart : Chest pain, history of coronary arterey disease GI:  Nausea, vomiting, diarrhea, constipation, GERD GU: Dysuria, urgency, frequency of urination, hematuria Neuro: Stroke, seizures, syncope Psych: Depression, anxiety, hallucinations   Physical Exam: Blood pressure 153/77, pulse 60, temperature 98 F (36.7 C), temperature source Oral, resp. rate 20, height 5\' 9"  (1.753 m), weight 54.432 kg (120 lb), SpO2 97.00%. Constitutional:   Patient is a well-developed and well-nourished male* in no acute distress and cooperative with exam. Head: Normocephalic and atraumatic Mouth: Mucus membranes moist Eyes: PERRL, EOMI, conjunctivae normal Neck: Supple, No Thyromegaly Cardiovascular: RRR, S1 normal, S2 normal Pulmonary/Chest: CTAB, no  wheezes, rales, or rhonchi Abdominal: Soft. Tenderness in the epigastric region, with guarding non-distended, bowel sounds are normal, no masses, organomegaly  Neurological: A&O x3, Strenght is normal and symmetric bilaterally, cranial nerve II-XII are grossly intact, no focal motor deficit, sensory intact to light touch bilaterally.  Extremities : No Cyanosis, Clubbing or Edema   Labs on Admission:  Results for orders placed during  the hospital encounter of 06/03/13 (from the past 48 hour(s))  COMPREHENSIVE METABOLIC PANEL     Status: Abnormal   Collection Time    06/03/13  2:00 AM      Result Value Range   Sodium 131 (*) 135 - 145 mEq/L   Potassium 4.3  3.5 - 5.1 mEq/L   Chloride 93 (*) 96 - 112 mEq/L   CO2 25  19 - 32 mEq/L   Glucose, Bld 98  70 - 99 mg/dL   BUN 9  6 - 23 mg/dL   Creatinine, Ser 1.61 (*) 0.50 - 1.35 mg/dL   Calcium 9.4  8.4 - 09.6 mg/dL   Total Protein 8.5 (*) 6.0 - 8.3 g/dL   Albumin 4.1  3.5 - 5.2 g/dL   AST 62 (*) 0 - 37 U/L   Comment: HEMOLYZED SPECIMEN, RESULTS MAY BE AFFECTED   ALT 26  0 - 53 U/L   Alkaline Phosphatase 165 (*) 39 - 117 U/L   Total Bilirubin 0.5  0.3 - 1.2 mg/dL   GFR calc non Af Amer >90  >90 mL/min   GFR calc Af Amer >90  >90 mL/min   Comment:            The eGFR has been calculated     using the CKD EPI equation.     This calculation has not been     validated in all clinical     situations.     eGFR's persistently     <90 mL/min signify     possible Chronic Kidney Disease.  LIPASE, BLOOD     Status: Abnormal   Collection Time    06/03/13  2:00 AM      Result Value Range   Lipase 270 (*) 11 - 59 U/L  CBC WITH DIFFERENTIAL     Status: Abnormal   Collection Time    06/03/13  2:00 AM      Result Value Range   WBC 11.1 (*) 4.0 - 10.5 K/uL   RBC 4.27  4.22 - 5.81 MIL/uL   Hemoglobin 15.9  13.0 - 17.0 g/dL   HCT 04.5  40.9 - 81.1 %   MCV 107.5 (*) 78.0 - 100.0 fL   MCH 37.2 (*) 26.0 - 34.0 pg   MCHC 34.6  30.0 - 36.0 g/dL   RDW 91.4  78.2 - 95.6 %   Platelets 218  150 - 400 K/uL   Neutrophils Relative % 57  43 - 77 %   Neutro Abs 6.3  1.7 - 7.7 K/uL   Lymphocytes Relative 32  12 - 46 %   Lymphs Abs 3.5  0.7 - 4.0 K/uL   Monocytes Relative 10  3 - 12 %   Monocytes Absolute 1.1 (*) 0.1 - 1.0 K/uL   Eosinophils Relative 1  0 - 5 %   Eosinophils Absolute 0.1  0.0 - 0.7 K/uL   Basophils Relative 0  0 - 1 %   Basophils Absolute 0.0  0.0 - 0.1 K/uL     Radiological Exams on Admission: No results found.  Assessment/Plan Active  Problems:   Acute alcoholic pancreatitis   Alcohol abuse, continuous   Tobacco abuse   Seizure disorder   COPD (chronic obstructive pulmonary disease)  Acute alcoholic pancreatitis Patient admitted to the hospital  We'll start IV normal saline at 125 per hr Zofran for nausea and vomiting  Dilaudid 1 mg IV every 2 hours when necessary for pain  We'll keep him n.p.o. , Obtain abdominal ultrasound to rule out gallstones as alkaline phosphatase is mildly elevated   Alcohol abuse  We'll put the patient on CIWA protocol  We'll add thiamine and folic acid to IV fluids  Seizure disorder Continue with Keppra and Tegretol  Mild hyponatremia Likely due to dehydration will follow the BMP in a.m.   COPD Continue when necessary Atrovent nebulizers  DVT prophylaxis Lovenox  Code status:full code presumed   Family discussion: discussed with patient in detail .no family at bedside    Time Spent on Admission: 55 min  [facility] Pager: [phone] 06/03/2013, 4:43 AM  If 7PM-7AM, please contact night-coverage  [email or web address]  Password [email or web address]

## 2013-06-03 NOTE — Progress Notes (Signed)
[id number] 

## 2013-06-03 NOTE — ED Notes (Signed)
Pt denies vomiting or diarrhea

## 2013-06-03 NOTE — ED Notes (Signed)
Pt ambulatory to the bathroom 

## 2013-06-03 NOTE — ED Notes (Signed)
Pt standing and pacing in room, MD advised need more pain medication, orders given.

## 2013-06-04 LAB — LIPASE, BLOOD: Lipase: 196 U/L — ABNORMAL HIGH (ref 11–59)

## 2013-06-04 LAB — BASIC METABOLIC PANEL
Chloride: 99 mEq/L (ref 96–112)
Creatinine, Ser: 0.4 mg/dL — ABNORMAL LOW (ref 0.50–1.35)
GFR calc Af Amer: >90 mL/min (ref >90)
GFR calc non Af Amer: >90 mL/min (ref >90)
Potassium: 3.3 mEq/L — ABNORMAL LOW (ref 3.5–5.1)

## 2013-06-04 LAB — TSH: TSH: 2.333 u[IU]/mL (ref 0.350–4.500)

## 2013-06-04 LAB — HEPATIC FUNCTION PANEL
AST: 30 U/L (ref 0–37)
Bilirubin, Direct: 0.2 mg/dL (ref 0.0–0.3)

## 2013-06-04 MED ORDER — POTASSIUM CHLORIDE CRYS ER 20 MEQ PO TBCR
20.0000 meq | EXTENDED_RELEASE_TABLET | Freq: Every day | ORAL | Status: AC
  Administered 2013-06-04 – 2013-06-05 (×2): 20 meq via ORAL
  Filled 2013-06-04 (×2): qty 1

## 2013-06-04 MED ORDER — HYDROMORPHONE HCL PF 1 MG/ML IJ SOLN
2.0000 mg | INTRAMUSCULAR | Status: DC | PRN
  Administered 2013-06-04 – 2013-06-06 (×19): 2 mg via INTRAVENOUS
  Filled 2013-06-04 (×8): qty 2
  Filled 2013-06-04: qty 1
  Filled 2013-06-04 (×6): qty 2
  Filled 2013-06-04: qty 1
  Filled 2013-06-04 (×2): qty 2
  Filled 2013-06-04: qty 1
  Filled 2013-06-04 (×2): qty 2

## 2013-06-04 NOTE — Plan of Care (Signed)
Problem: Phase I Progression Outcomes Goal: OOB as tolerated unless otherwise ordered Outcome: Completed/Met Date Met:  06/04/13 Ambulating in room.

## 2013-06-04 NOTE — Progress Notes (Signed)
NAMEMAYS, PAINO NO.:  [id number]  MEDICAL RECORD NO.:  [id number]  LOCATION:                        FACILITY:  [facility]  PHYSICIAN:  Melvyn Novas, MDDATE OF BIRTH:  [birth year]  DATE OF PROCEDURE: DATE OF DISCHARGE:                                PROGRESS NOTE   The patient has severe recurrent ethanolic hepatitis, pancreatitis, has chronic severe COPD with current exacerbation status post left upper lobe cavitating pneumonia with prolonged antibiotic administration several months ago, history of seizure disorder, presumed withdrawal. The patient presents with drinking six 16 ounce beers per day.  He denies any other alcohol, however, family is in the room, presumed greater amount, comes in with pancreatitis, lipase significantly elevated.  Hemoglobin is 15.9, creatinine is 0.45, currently on ethanol withdrawal protocol with Ativan, thiamine, given hydromorphone for significant epigastric pain.  He does require higher amounts from previous hospitalization according to my memory.  We will give 2 mg IV q.2-3 h. p.r.n.  Blood pressure 157/89, temperature 97.4, pulse 59 and regular, respiratory rate is 16.  Lungs show prolonged inspiratory and expiratory phase.  Moderate inspiratory and expiratory wheezes. Scattered rhonchi.  No rales appreciable.  Heart, regular rhythm.  No murmurs, gallops, or rubs.  No S3, S4 appreciable.  Abdomen: Significant epigastric tenderness.  Positive guarding, no rebound. Lipase elevated at 270 as of yesterday.  IMPRESSION:  Acute pancreatitis, ethanol hepatitis, history of seizure disorder, chronic obstructive pulmonary disease exacerbation __________ packs per day status post left upper lobe cavitary pneumonia 6 months ago with prolonged healing.  PLAN:  Right now is to continue Dilaudid, monitor lipase and liver function tests on a daily basis.  We will put him on Ativan protocol for sedation, 24 hour  nicotine patch of 21 mg, and I will make further recommendations as the database expands.     Melvyn Novas, MD     RMD/MEDQ  D:  06/03/2013  T:  06/03/2013  Job:  [id number]

## 2013-06-04 NOTE — Progress Notes (Signed)
[id number] 

## 2013-06-05 LAB — HEPATIC FUNCTION PANEL
Albumin: 4 g/dL (ref 3.5–5.2)
Alkaline Phosphatase: 152 U/L — ABNORMAL HIGH (ref 39–117)
Indirect Bilirubin: 0.3 mg/dL (ref 0.3–0.9)
Total Protein: 8.2 g/dL (ref 6.0–8.3)

## 2013-06-05 LAB — BASIC METABOLIC PANEL
Calcium: 9.6 mg/dL (ref 8.4–10.5)
GFR calc Af Amer: >90 mL/min (ref >90)
GFR calc non Af Amer: >90 mL/min (ref >90)
Glucose, Bld: 172 mg/dL — ABNORMAL HIGH (ref 70–99)
Potassium: 3.9 mEq/L (ref 3.5–5.1)
Sodium: 135 mEq/L (ref 135–145)

## 2013-06-05 LAB — LIPASE, BLOOD: Lipase: 81 U/L — ABNORMAL HIGH (ref 11–59)

## 2013-06-05 MED ORDER — NICOTINE 21 MG/24HR TD PT24
21.0000 mg | MEDICATED_PATCH | Freq: Every day | TRANSDERMAL | Status: DC
  Administered 2013-06-05 – 2013-06-06 (×2): 21 mg via TRANSDERMAL
  Filled 2013-06-05 (×2): qty 1

## 2013-06-05 MED ORDER — METHYLPREDNISOLONE SODIUM SUCC 125 MG IJ SOLR
80.0000 mg | Freq: Three times a day (TID) | INTRAMUSCULAR | Status: DC
  Administered 2013-06-05 – 2013-06-06 (×3): 80 mg via INTRAVENOUS
  Filled 2013-06-05 (×3): qty 2

## 2013-06-05 NOTE — Care Management Note (Signed)
    Page 1 of 1   06/05/2013     2:42:27 PM   CARE MANAGEMENT NOTE 06/05/2013  Patient:  [name], [name]   Account Number:  [id number]  Date Initiated:  06/05/2013  Documentation initiated by:  Rosemary Holms  Subjective/Objective Assessment:   Pt admitted with Panc./COPD. Advancing diet. No HH needs anticipated.     Action/Plan:   Anticipated DC Date:  06/06/2013   Anticipated DC Plan:  HOME/SELF CARE      DC Planning Services  CM consult      Choice offered to / List presented to:             Status of service:  Completed, signed off Medicare Important Message given?   (If response is "NO", the following Medicare IM given date fields will be blank) Date Medicare IM given:   Date Additional Medicare IM given:    Discharge Disposition:    Per UR Regulation:    If discussed at Long Length of Stay Meetings, dates discussed:    Comments:  06/05/13 Rosemary Holms RN BSN CM

## 2013-06-05 NOTE — Progress Notes (Signed)
[id number] 

## 2013-06-05 NOTE — Progress Notes (Signed)
NAMEANTONE, SUMMONS NO.:  [id number]  MEDICAL RECORD NO.:  [id number]  LOCATION:  A331                          FACILITY:  [facility]  PHYSICIAN:  Melvyn Novas, MDDATE OF BIRTH:  1976/09/20  DATE OF PROCEDURE: DATE OF DISCHARGE:                                PROGRESS NOTE   The patient has alcoholic pancreatitis, hepatitis, ethanol withdrawal issues, hypokalemia, COPD with bronchospasm, tobacco abuse, status post left upper lobe pneumonia several months ago.  No evidence of this at present clinically.  The patient is on Dilaudid 2 mg IV q.2-3h, the pain is fairly well controlled.  Blood pressure 158/85, temperature 98.2, respiratory rate is 18, O2 sat 94%.  Potassium 3.3, creatinine 0.4. Ultrasound of the biliary tract reveals no evidence of gallbladder wall thickening, ductal tortuosity, or stones.  PLAN:  Right now is to continue ethanol withdrawal, thiamine, Ativan protocol.  Continue Dilaudid for pancreatitis pain.  Monitor liver function tests, lipase daily. We will wean Dilaudid since lipases dipped to 200s from 279, somewhat of an improvement and continue Solu-Medrol for bronchospasm as well as DuoNeb nebulizer.     Melvyn Novas, MD     RMD/MEDQ  D:  06/04/2013  T:  06/05/2013  Job:  [id number]

## 2013-06-06 LAB — BASIC METABOLIC PANEL
CO2: 28 mEq/L (ref 19–32)
Chloride: 98 mEq/L (ref 96–112)
Glucose, Bld: 130 mg/dL — ABNORMAL HIGH (ref 70–99)
Potassium: 3.7 mEq/L (ref 3.5–5.1)
Sodium: 136 mEq/L (ref 135–145)

## 2013-06-06 LAB — HEPATIC FUNCTION PANEL
ALT: 13 U/L (ref 0–53)
AST: 22 U/L (ref 0–37)
Albumin: 3.5 g/dL (ref 3.5–5.2)
Alkaline Phosphatase: 125 U/L — ABNORMAL HIGH (ref 39–117)
Indirect Bilirubin: 0.3 mg/dL (ref 0.3–0.9)
Total Protein: 7.2 g/dL (ref 6.0–8.3)

## 2013-06-06 LAB — LIPASE, BLOOD: Lipase: 55 U/L (ref 11–59)

## 2013-06-06 MED ORDER — THIAMINE HCL 100 MG PO TABS: 100.0000 mg | ORAL_TABLET | Freq: Every day | ORAL | Status: DC

## 2013-06-06 MED ORDER — LORAZEPAM 1 MG PO TABS: 1.0000 mg | ORAL_TABLET | Freq: Four times a day (QID) | ORAL | Status: DC | PRN

## 2013-06-06 MED ORDER — ADULT MULTIVITAMIN W/MINERALS CH: 1.0000 | ORAL_TABLET | Freq: Every day | ORAL | Status: DC

## 2013-06-06 NOTE — Discharge Summary (Signed)
Pt stated he was ready to go home and he was in No pain.  Pt's IV was removed before DC and he will be walked to car by PCT and family will be picking pt up.  Pt was given script to take to pharm and also given ETOH education print outs.

## 2013-06-06 NOTE — Discharge Summary (Signed)
NAMEHEMI, CHACKO NO.:  [id number]  MEDICAL RECORD NO.:  [id number]  LOCATION:  A331                          FACILITY:  [facility]  PHYSICIAN:  Melvyn Novas, MDDATE OF BIRTH:  May 28, 1976  DATE OF ADMISSION:  06/03/2013 DATE OF DISCHARGE:  LH                              DISCHARGE SUMMARY   HISTORY OF PRESENT ILLNESS:  The patient is a 37 year old white male, with current ethanol, pancreatitis, and hepatitis admitted 4 days. Prior to his discharge, he had a lipase of 281.  Subsequently defervesced, his epigastric pain diminished.  His diet was advanced from clear liquids to full liquids to dysphagia-3, and he tolerated this well.  The patient also had some bronchospasm in hospital.  He has chronic COPD and nicotine abuse.  This was treated with nebulizer therapy of DuoNeb as well as Solu-Medrol IV 125, then diminished to 80 mg IV q.8 hours with fair response.  He likewise has a seizure disorder and there was no evidence of seizure activity in the hospital.  The patient wanted to go home, and was improving steadily and was felt safe to let him go home.  Today, he is admonished not to smoke and not to drink, and imbibe alcohol and to follow up in my office in 2 days' time for assessment of electrolytes, lipase, and clinical symptomatology.  DISCHARGE MEDICATIONS:  Ativan 1 mg p.o. t.i.d. for 1 week's time, #21; thiamine 100 mg p.o. daily;  multivitamin 1 tablet p.o. daily; Ventolin HFA inhaler 2 puffs q.i.d.; Tegretol 200 mg p.o. t.i.d.; Keppra 500 mg p.o. b.i.d., Percocet 10/325 p.o. t.i.d. p.r.n.  The patient was advised to follow up with alcoholics anonymous, and not to smoke cigarettes.  He was offered Chantix several times, however, the patient refused.     Melvyn Novas, MD     RMD/MEDQ  D:  06/06/2013  T:  06/06/2013  Job:  [id number]

## 2013-06-06 NOTE — Progress Notes (Signed)
[name], [name] NO.:  [id number]  MEDICAL RECORD NO.:  [id number]  LOCATION:  A331                          FACILITY:  [facility]  PHYSICIAN:  Melvyn Novas, MDDATE OF BIRTH:  1976-07-16  DATE OF PROCEDURE:  06/05/2013 DATE OF DISCHARGE:                                PROGRESS NOTE   PROBLEMS: 1. Acute alcoholic pancreatitis. 2. Acute alcoholic hepatitis. 3. Ethanol withdrawal. 4. COPD. 5. Elevated lipase.  The patient appears somewhat agitated, currently on Ativan protocol, as well as Dilaudid for pain, tolerating full liquid diet.  Lipase has diminished today from 270-81.  Lungs show less wheezes, prolonged expiratory phase,  mild end-expiratory wheeze only, scattered rhonchi. No rales.  Heart, regular rhythm.  No murmurs, gallops, or rubs. Positive epigastric tenderness to palpation noted.  PLAN:  Right now is to continue full liquids, diminish Solu-Medrol to 80 q.8 hours, advance to dysphagia diet and see how he tolerates this and follow transaminases, as well as lipase in a.m.     Melvyn Novas, MD     RMD/MEDQ  D:  06/05/2013  T:  06/06/2013  Job:  [id number]

## 2013-06-06 NOTE — Discharge Summary (Signed)
[id number] 

## 2013-06-08 NOTE — Progress Notes (Signed)
UR chart review completed.  

## 2013-07-27 ENCOUNTER — Encounter (HOSPITAL_COMMUNITY): Payer: Self-pay

## 2013-07-27 ENCOUNTER — Inpatient Hospital Stay (HOSPITAL_COMMUNITY)
Admission: EM | Admit: 2013-07-27 | Discharge: 2013-07-30 | DRG: 440 | Disposition: A | Discharge status: Home / Self Care | Payer: Medicaid Other | Attending: Family Medicine | Admitting: Family Medicine

## 2013-07-27 DIAGNOSIS — R109 Unspecified abdominal pain: Secondary | ICD-10-CM

## 2013-07-27 DIAGNOSIS — K859 Acute pancreatitis without necrosis or infection, unspecified: Principal | ICD-10-CM

## 2013-07-27 DIAGNOSIS — F1010 Alcohol abuse, uncomplicated: Secondary | ICD-10-CM

## 2013-07-27 DIAGNOSIS — Z23 Encounter for immunization: Secondary | ICD-10-CM

## 2013-07-27 DIAGNOSIS — Z8249 Family history of ischemic heart disease and other diseases of the circulatory system: Secondary | ICD-10-CM

## 2013-07-27 DIAGNOSIS — Z720 Tobacco use: Secondary | ICD-10-CM

## 2013-07-27 DIAGNOSIS — K852 Alcohol induced acute pancreatitis without necrosis or infection: Secondary | ICD-10-CM | POA: Diagnosis present

## 2013-07-27 DIAGNOSIS — G40909 Epilepsy, unspecified, not intractable, without status epilepticus: Secondary | ICD-10-CM | POA: Diagnosis present

## 2013-07-27 DIAGNOSIS — F1020 Alcohol dependence, uncomplicated: Secondary | ICD-10-CM | POA: Diagnosis present

## 2013-07-27 DIAGNOSIS — J4489 Other specified chronic obstructive pulmonary disease: Secondary | ICD-10-CM | POA: Diagnosis present

## 2013-07-27 DIAGNOSIS — F17200 Nicotine dependence, unspecified, uncomplicated: Secondary | ICD-10-CM | POA: Diagnosis present

## 2013-07-27 DIAGNOSIS — J449 Chronic obstructive pulmonary disease, unspecified: Secondary | ICD-10-CM | POA: Diagnosis present

## 2013-07-27 DIAGNOSIS — I10 Essential (primary) hypertension: Secondary | ICD-10-CM | POA: Diagnosis present

## 2013-07-27 LAB — CBC WITH DIFFERENTIAL/PLATELET
Basophils Relative: 0 % (ref 0–1)
Eosinophils Absolute: 0.1 10*3/uL (ref 0.0–0.7)
Hemoglobin: 14.8 g/dL (ref 13.0–17.0)
MCH: 35.5 pg — ABNORMAL HIGH (ref 26.0–34.0)
MCHC: 33.5 g/dL (ref 30.0–36.0)
Neutro Abs: 2.5 10*3/uL (ref 1.7–7.7)
Neutrophils Relative %: 38 % — ABNORMAL LOW (ref 43–77)
Platelets: 304 10*3/uL (ref 150–400)
RBC: 4.17 MIL/uL — ABNORMAL LOW (ref 4.22–5.81)

## 2013-07-27 LAB — LIPASE, BLOOD: Lipase: 213 U/L — ABNORMAL HIGH (ref 11–59)

## 2013-07-27 LAB — URINALYSIS, ROUTINE W REFLEX MICROSCOPIC
Bilirubin Urine: NEGATIVE
Glucose, UA: NEGATIVE mg/dL
Hgb urine dipstick: NEGATIVE
Nitrite: NEGATIVE
Specific Gravity, Urine: 1.01 (ref 1.005–1.030)
pH: 6.5 (ref 5.0–8.0)

## 2013-07-27 LAB — COMPREHENSIVE METABOLIC PANEL
ALT: 12 U/L (ref 0–53)
AST: 26 U/L (ref 0–37)
Albumin: 4.1 g/dL (ref 3.5–5.2)
Alkaline Phosphatase: 95 U/L (ref 39–117)
Chloride: 102 mEq/L (ref 96–112)
Potassium: 4.5 mEq/L (ref 3.5–5.1)
Sodium: 140 mEq/L (ref 135–145)
Total Bilirubin: 0.3 mg/dL (ref 0.3–1.2)
Total Protein: 7.8 g/dL (ref 6.0–8.3)

## 2013-07-27 LAB — BILIRUBIN, DIRECT: Bilirubin, Direct: <0.1 mg/dL (ref 0.0–0.3)

## 2013-07-27 MED ORDER — HYDROMORPHONE HCL PF 1 MG/ML IJ SOLN
1.0000 mg | Freq: Once | INTRAMUSCULAR | Status: AC
  Administered 2013-07-27: 1 mg via INTRAVENOUS
  Filled 2013-07-27: qty 1

## 2013-07-27 MED ORDER — CARBAMAZEPINE 200 MG PO TABS
400.0000 mg | ORAL_TABLET | Freq: Two times a day (BID) | ORAL | Status: DC
  Administered 2013-07-27 – 2013-07-30 (×7): 400 mg via ORAL
  Filled 2013-07-27 (×7): qty 2

## 2013-07-27 MED ORDER — ADULT MULTIVITAMIN W/MINERALS CH
1.0000 | ORAL_TABLET | Freq: Every day | ORAL | Status: DC
  Administered 2013-07-27 – 2013-07-30 (×4): 1 via ORAL
  Filled 2013-07-27 (×4): qty 1

## 2013-07-27 MED ORDER — ONDANSETRON HCL 4 MG PO TABS: 4.0000 mg | ORAL_TABLET | Freq: Four times a day (QID) | ORAL | Status: DC | PRN

## 2013-07-27 MED ORDER — LORAZEPAM 2 MG/ML IJ SOLN
1.0000 mg | Freq: Four times a day (QID) | INTRAMUSCULAR | Status: DC | PRN
  Administered 2013-07-27 – 2013-07-28 (×3): 1 mg via INTRAVENOUS
  Filled 2013-07-27 (×3): qty 1

## 2013-07-27 MED ORDER — THIAMINE HCL 100 MG/ML IJ SOLN: 100.0000 mg | Freq: Every day | INTRAMUSCULAR | Status: DC

## 2013-07-27 MED ORDER — ONDANSETRON HCL 4 MG/2ML IJ SOLN
4.0000 mg | Freq: Once | INTRAMUSCULAR | Status: AC
  Administered 2013-07-27: 4 mg via INTRAVENOUS
  Filled 2013-07-27: qty 2

## 2013-07-27 MED ORDER — ACETAMINOPHEN 650 MG RE SUPP: 650.0000 mg | Freq: Four times a day (QID) | RECTAL | Status: DC | PRN

## 2013-07-27 MED ORDER — BISACODYL 10 MG RE SUPP: 10.0000 mg | Freq: Every day | RECTAL | Status: DC | PRN

## 2013-07-27 MED ORDER — SODIUM CHLORIDE 0.9 % IV SOLN
Freq: Once | INTRAVENOUS | Status: AC
  Administered 2013-07-27: 1000 mL via INTRAVENOUS

## 2013-07-27 MED ORDER — LEVETIRACETAM 500 MG PO TABS
500.0000 mg | ORAL_TABLET | Freq: Two times a day (BID) | ORAL | Status: DC
  Administered 2013-07-27 – 2013-07-30 (×7): 500 mg via ORAL
  Filled 2013-07-27 (×7): qty 1

## 2013-07-27 MED ORDER — IPRATROPIUM BROMIDE 0.02 % IN SOLN: 0.5000 mg | Freq: Four times a day (QID) | RESPIRATORY_TRACT | Status: DC | PRN

## 2013-07-27 MED ORDER — LORAZEPAM 1 MG PO TABS
1.0000 mg | ORAL_TABLET | Freq: Four times a day (QID) | ORAL | Status: DC | PRN
  Administered 2013-07-29: 1 mg via ORAL
  Filled 2013-07-27: qty 1

## 2013-07-27 MED ORDER — VITAMIN B-1 100 MG PO TABS
100.0000 mg | ORAL_TABLET | Freq: Every day | ORAL | Status: DC
  Administered 2013-07-27 – 2013-07-30 (×4): 100 mg via ORAL
  Filled 2013-07-27 (×4): qty 1

## 2013-07-27 MED ORDER — ACETAMINOPHEN 325 MG PO TABS: 650.0000 mg | ORAL_TABLET | Freq: Four times a day (QID) | ORAL | Status: DC | PRN

## 2013-07-27 MED ORDER — HYDROMORPHONE HCL PF 1 MG/ML IJ SOLN
1.0000 mg | INTRAMUSCULAR | Status: DC | PRN
  Administered 2013-07-27 – 2013-07-28 (×5): 1 mg via INTRAVENOUS
  Filled 2013-07-27 (×6): qty 1

## 2013-07-27 MED ORDER — INFLUENZA VAC SPLIT QUAD 0.5 ML IM SUSP
0.5000 mL | INTRAMUSCULAR | Status: AC
  Administered 2013-07-28: 0.5 mL via INTRAMUSCULAR
  Filled 2013-07-27: qty 0.5

## 2013-07-27 MED ORDER — TRAZODONE HCL 50 MG PO TABS
25.0000 mg | ORAL_TABLET | Freq: Every evening | ORAL | Status: DC | PRN
  Administered 2013-07-28 – 2013-07-29 (×2): 25 mg via ORAL
  Filled 2013-07-27 (×2): qty 1

## 2013-07-27 MED ORDER — ENOXAPARIN SODIUM 40 MG/0.4ML ~~LOC~~ SOLN
40.0000 mg | SUBCUTANEOUS | Status: DC
  Administered 2013-07-27 – 2013-07-29 (×3): 40 mg via SUBCUTANEOUS
  Filled 2013-07-27 (×3): qty 0.4

## 2013-07-27 MED ORDER — ALUM & MAG HYDROXIDE-SIMETH 200-200-20 MG/5ML PO SUSP: 30.0000 mL | Freq: Four times a day (QID) | ORAL | Status: DC | PRN

## 2013-07-27 MED ORDER — ONDANSETRON HCL 4 MG/2ML IJ SOLN: 4.0000 mg | Freq: Four times a day (QID) | INTRAMUSCULAR | Status: DC | PRN

## 2013-07-27 MED ORDER — FOLIC ACID 1 MG PO TABS
1.0000 mg | ORAL_TABLET | Freq: Every day | ORAL | Status: DC
  Administered 2013-07-27 – 2013-07-30 (×4): 1 mg via ORAL
  Filled 2013-07-27 (×4): qty 1

## 2013-07-27 MED ORDER — SENNA 8.6 MG PO TABS
1.0000 | ORAL_TABLET | Freq: Two times a day (BID) | ORAL | Status: DC
  Administered 2013-07-27 – 2013-07-30 (×7): 8.6 mg via ORAL
  Filled 2013-07-27 (×7): qty 1

## 2013-07-27 MED ORDER — SODIUM CHLORIDE 0.9 % IV SOLN
INTRAVENOUS | Status: DC
  Administered 2013-07-28 – 2013-07-29 (×3): via INTRAVENOUS

## 2013-07-27 MED ORDER — HYDROMORPHONE HCL PF 1 MG/ML IJ SOLN
1.0000 mg | INTRAMUSCULAR | Status: AC
Start: 2013-07-27 — End: 2013-07-27
  Administered 2013-07-27: 1 mg via INTRAVENOUS
  Filled 2013-07-27: qty 1

## 2013-07-27 NOTE — ED Notes (Signed)
Pt requesting something to drink and pain med.  Toya Smothers, NP at bedside and notified.

## 2013-07-27 NOTE — ED Provider Notes (Signed)
Medical screening examination/treatment/procedure(s) were performed by non-physician practitioner and as supervising physician I was immediately available for consultation/collaboration.  Case discussed with me.    Shelda Jakes, MD 07/27/13 [phone]

## 2013-07-27 NOTE — ED Notes (Signed)
MD at bedside. 

## 2013-07-27 NOTE — ED Provider Notes (Signed)
CSN: [id number]     Arrival date & time 07/27/13  4098 History   First MD Initiated Contact with Patient 07/27/13 [phone]     Chief Complaint  Patient presents with  . Abdominal Pain   (Consider location/radiation/quality/duration/timing/severity/associated sxs/prior Treatment) Patient is a 37 y.o. male presenting with abdominal pain. The history is provided by the patient.  Abdominal Pain Pain location:  RUQ and RLQ Pain quality: sharp   Pain radiates to:  Epigastric region Pain severity:  Severe (10/10) Onset quality:  Gradual Duration:  24 hours Timing:  Constant Progression:  Worsening Chronicity:  Chronic Context: alcohol use   Relieved by:  Nothing Worsened by:  Nothing tried Ineffective treatments: percocet. Associated symptoms: no anorexia, no chest pain, no chills, no cough, no fever, no nausea, no shortness of breath and no vomiting    SHAWNEE HIGHAM is a 37 y.o. male who presents to the ED with severe right side abdominal pain. He has been drinking alcohol.  He was admitted last month for pancreatitis and this pain feels the same but worse. He has been taking oxycodone for the pain without relief.   Past Medical History  Diagnosis Date  . Pancreatitis   . Seizures   . Back pain   . ETOH abuse   . COPD (chronic obstructive pulmonary disease)   . SAH (subarachnoid hemorrhage)   . Macrocytosis 08/17/2011  . Bradycardia 08/17/2011  . Pneumonia    Past Surgical History  Procedure Laterality Date  . Back surgery    . Septoplasty     Family History  Problem Relation Age of Onset  . Seizures Father   . Alcohol abuse Father   . Coronary artery disease Mother     deceased age 32   History  Substance Use Topics  . Smoking status: Current Every Day Smoker -- 1.00 packs/day for 20 years    Types: Cigarettes  . Smokeless tobacco: Never Used  . Alcohol Use: 25.2 oz/week    42 Cans of beer per week     Comment: occasionally    Review of Systems  Constitutional:  Negative for fever and chills.  HENT: Negative for neck pain.   Eyes: Negative for visual disturbance.  Respiratory: Negative for cough and shortness of breath.   Cardiovascular: Negative for chest pain.  Gastrointestinal: Positive for abdominal pain. Negative for nausea, vomiting and anorexia.  Genitourinary: Negative for urgency and frequency.  Musculoskeletal: Positive for back pain.  Skin: Negative for rash.  Neurological: Negative for syncope and headaches.  Psychiatric/Behavioral: The patient is not nervous/anxious.     Allergies  Review of patient's allergies indicates no known allergies.  Home Medications   Current Outpatient Rx  Name  Route  Sig  Dispense  Refill  . albuterol (PROVENTIL HFA;VENTOLIN HFA) 108 (90 BASE) MCG/ACT inhaler   Inhalation   Inhale 1-2 puffs into the lungs every 6 (six) hours as needed for wheezing.   1 Inhaler   0   . carbamazepine (TEGRETOL) 200 MG tablet   Oral   Take 200-400 mg by mouth See admin instructions. Take 2 tablets by mouth twice daily.  May take 1 tablet at lunch if needed.         . levETIRAcetam (KEPPRA) 500 MG tablet   Oral   Take 500 mg by mouth 2 (two) times daily.          . Multiple Vitamin (MULTIVITAMIN WITH MINERALS) TABS tablet   Oral   Take 1 tablet  by mouth daily.   30 tablet   3   . oxyCODONE-acetaminophen (PERCOCET) 10-325 MG per tablet   Oral   Take 1 tablet by mouth every 4 (four) hours as needed for pain.   30 tablet   0    BP 146/81  Pulse 95  Temp(Src) 98.1 F (36.7 C) (Oral)  Resp 20  SpO2 98% Physical Exam  Nursing note and vitals reviewed. Constitutional: He is oriented to person, place, and time. No distress.  Elevated BP  HENT:  Head: Normocephalic and atraumatic.  Eyes: EOM are normal.  Neck: Normal range of motion. Neck supple.  Cardiovascular: Normal rate and regular rhythm.   Pulmonary/Chest: Effort normal. He has wheezes.  Abdominal: Soft. Bowel sounds are normal. There is  tenderness in the right upper quadrant, right lower quadrant and epigastric area.  Musculoskeletal: Normal range of motion.  Neurological: He is alert and oriented to person, place, and time. No cranial nerve deficit.  Skin: Skin is warm and dry.  Psychiatric: He has a normal mood and affect. His behavior is normal.    ED Course  Procedures  Results for orders placed during the hospital encounter of 07/27/13 (from the past 24 hour(s))  CBC WITH DIFFERENTIAL     Status: Abnormal   Collection Time    07/27/13  9:19 AM      Result Value Range   WBC 6.7  4.0 - 10.5 K/uL   RBC 4.17 (*) 4.22 - 5.81 MIL/uL   Hemoglobin 14.8  13.0 - 17.0 g/dL   HCT 16.1  09.6 - 04.5 %   MCV 106.0 (*) 78.0 - 100.0 fL   MCH 35.5 (*) 26.0 - 34.0 pg   MCHC 33.5  30.0 - 36.0 g/dL   RDW 40.9  81.1 - 91.4 %   Platelets 304  150 - 400 K/uL   Neutrophils Relative % 38 (*) 43 - 77 %   Neutro Abs 2.5  1.7 - 7.7 K/uL   Lymphocytes Relative 48 (*) 12 - 46 %   Lymphs Abs 3.2  0.7 - 4.0 K/uL   Monocytes Relative 13 (*) 3 - 12 %   Monocytes Absolute 0.8  0.1 - 1.0 K/uL   Eosinophils Relative 2  0 - 5 %   Eosinophils Absolute 0.1  0.0 - 0.7 K/uL   Basophils Relative 0  0 - 1 %   Basophils Absolute 0.0  0.0 - 0.1 K/uL  COMPREHENSIVE METABOLIC PANEL     Status: Abnormal   Collection Time    07/27/13  9:19 AM      Result Value Range   Sodium 140  135 - 145 mEq/L   Potassium 4.5  3.5 - 5.1 mEq/L   Chloride 102  96 - 112 mEq/L   CO2 29  19 - 32 mEq/L   Glucose, Bld 106 (*) 70 - 99 mg/dL   BUN 6  6 - 23 mg/dL   Creatinine, Ser 7.82 (*) 0.50 - 1.35 mg/dL   Calcium 9.6  8.4 - 95.6 mg/dL   Total Protein 7.8  6.0 - 8.3 g/dL   Albumin 4.1  3.5 - 5.2 g/dL   AST 26  0 - 37 U/L   ALT 12  0 - 53 U/L   Alkaline Phosphatase 95  39 - 117 U/L   Total Bilirubin 0.3  0.3 - 1.2 mg/dL   GFR calc non Af Amer >90  >90 mL/min   GFR calc Af Amer >90  >90  mL/min  LIPASE, BLOOD     Status: Abnormal   Collection Time    07/27/13   9:19 AM      Result Value Range   Lipase 213 (*) 11 - 59 U/L    MDM  37 y.o. male with abdominal pain. ETOH abuse, pancreatitis. Will admit for pain management and further evaluation. Patient agrees with plan of care.    Janne Napoleon, [place] 07/27/13 1217

## 2013-07-27 NOTE — H&P (Signed)
[facility] History and Physical  [name] ZOX:[id number] DOB: 21-Dec-1975 DOA: 07/27/2013  Referring physician:  PCP: Isabella Stalling, MD  Specialists:   Chief Complaint: Abdominal pain  HPI: [name] is a 37 y.o. male with a history of alcohol abuse, seizures, recurrent pancreatitis, COPD came to the ED with the chief complaint of abdominal pain which started yesterday. Information is obtained from the patient. He indicates that he spent most of yesterday watching the car races and drinking more beer than usual with his friends. He states the pain as constant and sharp and located in his right upper and right lower quadrant and "right where my pancreas is". He denies any nausea vomiting chest pain palpitations shortness of breath headache dizziness anorexia. He denies any constipation or diarrhea. He denies any frequency or urgency dysuria or hematuria. He states that he was taken his home prescription of oxycodone without relief. He indicates that he drinks 5-632 ounce beers per day. His last drink was last evening. They wish in the emergency department yields lab work significant for a lipase of 213. He indicates he's had several episodes of pancreatitis and that this episode was not the worst it's ever had. Of note patient was in the emergency room last month and admitted for same. Symptoms came on suddenly have persisted and are characterized as moderate to severe.   Review of Systems: 10 point review of systems complete and all systems are negative except as indicated in history of present illness.  Past Medical History  Diagnosis Date  . Pancreatitis   . Seizures   . Back pain   . ETOH abuse   . COPD (chronic obstructive pulmonary disease)   . SAH (subarachnoid hemorrhage)   . Macrocytosis 08/17/2011  . Bradycardia 08/17/2011  . Pneumonia    Past Surgical History  Procedure Laterality Date  . Back surgery    . Septoplasty     Social History:  reports  that he has been smoking Cigarettes.  He has a 20 pack-year smoking history. He has never used smokeless tobacco. He reports that he drinks about 25.2 ounces of alcohol per week. He reports that he does not use illicit drugs. He sent lives with his girlfriend is unemployed and independent with ADLs  No Known Allergies  Family History  Problem Relation Age of Onset  . Seizures Father   . Alcohol abuse Father   . Coronary artery disease Mother     deceased age 38    Prior to Admission medications   Medication Sig Start Date End Date Taking? Authorizing Provider  albuterol (PROVENTIL HFA;VENTOLIN HFA) 108 (90 BASE) MCG/ACT inhaler Inhale 1-2 puffs into the lungs every 6 (six) hours as needed for wheezing. 10/19/12  Yes Nicoletta Dress. Colon Branch, MD  carbamazepine (TEGRETOL) 200 MG tablet Take 200-400 mg by mouth See admin instructions. Take 2 tablets by mouth twice daily.  May take 1 tablet at lunch if needed.   Yes Historical Provider, MD  levETIRAcetam (KEPPRA) 500 MG tablet Take 500 mg by mouth 2 (two) times daily.    Yes Historical Provider, MD  Multiple Vitamin (MULTIVITAMIN WITH MINERALS) TABS tablet Take 1 tablet by mouth daily. 06/06/13  Yes Isabella Stalling, MD  oxyCODONE-acetaminophen (PERCOCET) 10-325 MG per tablet Take 1 tablet by mouth every 4 (four) hours as needed for pain. 11/10/12  Yes Isabella Stalling, MD   Physical Exam: Filed Vitals:   07/27/13 1201  BP: 147/79  Pulse: 61  Temp:  Resp: 20     General:  Well-nourished somewhat unkempt appearing no acute distress  Eyes: PE RRL, EOMI, no scleral icterus  ENT: Ears clear nose without drainage oropharynx without erythema or exudate. Mucous membranes of his mouth are pink slightly dry poor dentition  Neck: Supple no JVD full range of motion no lymphadenopathy  Cardiovascular: Regular rate and rhythm no murmur no gallop no rub. No lower extremity edema  Respiratory: Normal effort. Breath sounds are distant with diffuse  expiratory wheezing. No crackles no rhonchi  Abdomen: Flat soft positive bowel sounds but somewhat sluggish. Mild tenderness to palpation particularly in the right upper and lower quadrants and in the epigastric area.  Skin: Warm and dry  Musculoskeletal: Moves all extremities. Ambulating in room with steady gait. Joints without swelling/erythema  Psychiatric: Operative and only slightly anxious  Neurologic: Cranial nerves II through XII grossly intact speech clear facial symmetry  Labs on Admission:  Basic Metabolic Panel:  Recent Labs Lab 07/27/13 0919  NA 140  K 4.5  CL 102  CO2 29  GLUCOSE 106*  BUN 6  CREATININE 0.37*  CALCIUM 9.6   Liver Function Tests:  Recent Labs Lab 07/27/13 0919  AST 26  ALT 12  ALKPHOS 95  BILITOT 0.3  PROT 7.8  ALBUMIN 4.1    Recent Labs Lab 07/27/13 0919  LIPASE 213*   No results found for this basename: AMMONIA,  in the last 168 hours CBC:  Recent Labs Lab 07/27/13 0919  WBC 6.7  NEUTROABS 2.5  HGB 14.8  HCT 44.2  MCV 106.0*  PLT 304   Cardiac Enzymes: No results found for this basename: CKTOTAL, CKMB, CKMBINDEX, TROPONINI,  in the last 168 hours  BNP (last 3 results) No results found for this basename: PROBNP,  in the last 8760 hours CBG: No results found for this basename: GLUCAP,  in the last 168 hours  Radiological Exams on Admission: No results found.  EKG:   Assessment/Plan Principal Problem:   Acute alcoholic pancreatitis: Likely related to increased alcohol intake over the last 24 hours. Will admit to medical floor. Will vigorously hydrate with IV fluids. Will keep n.p.o. for now. We'll provide pain medicine and antimanic as needed. Will check hepatic panel as well. Will recheck his lipase in the morning.  Active Problems:  Abdominal pain: Related to #1. Will provide pain medicine keep n.p.o. except sips with meds.    Alcohol abuse, continuous: Will provide CIWA protocol. Will add thiamine and folic  acid to his meds. Monitor closely for withdrawals. Request social work consult  HTN: Controlled at the time of my exam. On presentation his blood pressure is 129/101. Heart rate 73 respiratory 18. Elevated blood pressure likely related to pain. Will monitor    Tobacco abuse: Cessation counseling provided will also provide patch    Seizure disorder: Stable at baseline. Will continue his Keppra and Tegretol    COPD (chronic obstructive pulmonary disease): Appears stable at baseline. Will provide when necessary nebulizers as needed.    Code Status: full Family Communication: none available Disposition Plan: home when ready hopefully 24-48 hours  Time spent: 60 minutes  Gwenyth Bender [facility] Pager [phone]  If 7PM-7AM, please contact night-coverage [email or web address] Password [email or web address] 07/27/2013, 1:28 PM

## 2013-07-27 NOTE — ED Notes (Signed)
Pt reports abd pain since yesterday.  Pt denies any n/v.  Pt reports " my pancreas hurts".

## 2013-07-28 LAB — CBC
MCV: 107.4 fL — ABNORMAL HIGH (ref 78.0–100.0)
Platelets: 245 10*3/uL (ref 150–400)
RBC: 4.04 MIL/uL — ABNORMAL LOW (ref 4.22–5.81)
RDW: 12.9 % (ref 11.5–15.5)
WBC: 7.9 10*3/uL (ref 4.0–10.5)

## 2013-07-28 LAB — BASIC METABOLIC PANEL
BUN: 4 mg/dL — ABNORMAL LOW (ref 6–23)
CO2: 28 mEq/L (ref 19–32)
Calcium: 9.4 mg/dL (ref 8.4–10.5)
Creatinine, Ser: 0.39 mg/dL — ABNORMAL LOW (ref 0.50–1.35)
GFR calc Af Amer: >90 mL/min (ref >90)
GFR calc non Af Amer: >90 mL/min (ref >90)
Sodium: 138 mEq/L (ref 135–145)

## 2013-07-28 LAB — LIPASE, BLOOD: Lipase: 161 U/L — ABNORMAL HIGH (ref 11–59)

## 2013-07-28 MED ORDER — HYDROMORPHONE HCL PF 1 MG/ML IJ SOLN
2.0000 mg | INTRAMUSCULAR | Status: DC | PRN
  Administered 2013-07-28 – 2013-07-29 (×9): 2 mg via INTRAVENOUS
  Filled 2013-07-28 (×9): qty 2

## 2013-07-28 NOTE — H&P (Signed)
Patient seen and examined. Agree with note as above per Toya Smothers, NP.  The patient has been admitted with recurrent alcoholic pancreatitis. He is aware that consuming alcohol this week as her presentation. He's been advised to abstain from further alcohol. Treatment will be conservative with bowel rest, IV fluids and pain medications. He'll be kept n.p.o. for now.  Patient will be admitted to the service of Dr. Janna Arch. [facility] will be available for any issues until 7 AM on 9/30.  Kysean Sweet

## 2013-07-28 NOTE — Progress Notes (Signed)
[id number] 

## 2013-07-28 NOTE — Progress Notes (Signed)
Utilization Review Complete  

## 2013-07-28 NOTE — Care Management Note (Unsigned)
    Page 1 of 1   07/28/2013     12:04:31 PM   CARE MANAGEMENT NOTE 07/28/2013  Patient:  [name], [name]   Account Number:  [id number]  Date Initiated:  07/28/2013  Documentation initiated by:  Rosemary Holms  Subjective/Objective Assessment:   Pt admitted from home where he lives with his wife. No HH / DME needs     Action/Plan:   Anticipated DC Date:  07/30/2013   Anticipated DC Plan:  HOME/SELF CARE      DC Planning Services  CM consult      Choice offered to / List presented to:             Status of service:  In process, will continue to follow Medicare Important Message given?   (If response is "NO", the following Medicare IM given date fields will be blank) Date Medicare IM given:   Date Additional Medicare IM given:    Discharge Disposition:    Per UR Regulation:    If discussed at Long Length of Stay Meetings, dates discussed:    Comments:  07/28/13 Rosemary Holms RN BNS CM

## 2013-07-29 LAB — BASIC METABOLIC PANEL WITH GFR
BUN: 4 mg/dL — ABNORMAL LOW (ref 6–23)
CO2: 24 meq/L (ref 19–32)
Calcium: 9.4 mg/dL (ref 8.4–10.5)
Chloride: 96 meq/L (ref 96–112)
Creatinine, Ser: 0.39 mg/dL — ABNORMAL LOW (ref 0.50–1.35)
GFR calc Af Amer: >90 mL/min
GFR calc non Af Amer: >90 mL/min
Glucose, Bld: 64 mg/dL — ABNORMAL LOW (ref 70–99)
Potassium: 3.7 meq/L (ref 3.5–5.1)
Sodium: 135 meq/L (ref 135–145)

## 2013-07-29 LAB — MRSA PCR SCREENING: MRSA by PCR: NEGATIVE

## 2013-07-29 LAB — LIPASE, BLOOD: Lipase: 54 U/L (ref 11–59)

## 2013-07-29 MED ORDER — HYDROMORPHONE HCL PF 1 MG/ML IJ SOLN
1.0000 mg | INTRAMUSCULAR | Status: DC | PRN
  Administered 2013-07-29 – 2013-07-30 (×7): 1 mg via INTRAVENOUS
  Filled 2013-07-29 (×8): qty 1

## 2013-07-29 MED ORDER — OXYCODONE-ACETAMINOPHEN 5-325 MG PO TABS
1.0000 | ORAL_TABLET | ORAL | Status: DC | PRN
  Administered 2013-07-30: 1 via ORAL
  Filled 2013-07-29: qty 1

## 2013-07-29 NOTE — Progress Notes (Signed)
[id number] 

## 2013-07-29 NOTE — Progress Notes (Signed)
Called Dr. Otilio Saber office regarding patient's request to eat this morning.  New order given for Dysphagia 3 diet.  Will make patient aware.

## 2013-07-29 NOTE — Progress Notes (Signed)
[name], CARGILE NO.:  [id number]  MEDICAL RECORD NO.:  [id number]  LOCATION:  A335                          FACILITY:  [facility]  PHYSICIAN:  Melvyn Novas, MDDATE OF BIRTH:  [birth year]  DATE OF PROCEDURE: DATE OF DISCHARGE:                                PROGRESS NOTE   The patient admitted with alcoholic pancreatitis, has severe epigastric pain, some retching.  No vomiting.  No hematemesis.  No hematochezia. Lipase elevated at 239.  This morning, liver function test surprisingly normal.  Lungs show prolonged inspiratory and expiratory, scattered rhonchi, prolonged mild end-expiratory wheeze, no rales audible.  Heart: Regular rhythm.  No murmurs, gallops, or rubs.  Abdomen:  Significant epigastric tenderness.  No guarding, rebound, mass, or megaly.  Plan right now is to monitor lipase daily, BMET and CBC, and be sure there is no hemorrhage.  Continuous clear liquids.  Protonix.  Ativan per protocol for alcohol cessation and withdrawal, thiamine as well as Dilaudid 2 mg IV q.3h for pain.     Melvyn Novas, MD     RMD/MEDQ  D:  07/28/2013  T:  07/29/2013  Job:  [id number]

## 2013-07-30 LAB — BASIC METABOLIC PANEL
CO2: 29 mEq/L (ref 19–32)
Calcium: 9 mg/dL (ref 8.4–10.5)
Creatinine, Ser: 0.45 mg/dL — ABNORMAL LOW (ref 0.50–1.35)
GFR calc Af Amer: >90 mL/min (ref >90)
GFR calc non Af Amer: >90 mL/min (ref >90)
Potassium: 3.8 mEq/L (ref 3.5–5.1)

## 2013-07-30 LAB — LIPASE, BLOOD: Lipase: 70 U/L — ABNORMAL HIGH (ref 11–59)

## 2013-07-30 NOTE — Progress Notes (Signed)
NAMETERI, DILTZ NO.:  [id number]  MEDICAL RECORD NO.:  [id number]  LOCATION:  A335                          FACILITY:  [facility]  PHYSICIAN:  Melvyn Novas, MDDATE OF BIRTH:  July 10, 1976  DATE OF PROCEDURE:  07/29/2013 DATE OF DISCHARGE:                                PROGRESS NOTE   The patient admitted with ethanol pancreatitis with significant severe epigastric pain, elevated lipase in the 219 range.  The patient's lipase has normalized, but still has significant epigastric pain, tolerating a dysphagia-3 diet today.  No vomiting, no hematemesis, melena, hematochezia.  Blood pressure is 141/75, temperature 98.3, pulse 79 and regular, respiratory rate is 17.  Lungs show prolonged inspiratory and expiratory phase.  Scattered rhonchi.  No rales.  No wheeze audible. Heart regular rhythm.  No murmurs, gallops, or rubs.  Abdomen has significant epigastric tenderness.  No guarding, rebound, mass, or megaly.  Creatinine is 0.39.  PLAN:  Right now is to continue IV Protonix.  Diet, observe toleration of food and amount of pain.  Diminish Dilaudid to 1 mg and we will make further recommendations as the database expands.     Melvyn Novas, MD     RMD/MEDQ  D:  07/29/2013  T:  07/30/2013  Job:  [id number]

## 2013-07-30 NOTE — Progress Notes (Signed)
Pt discharged home today per Dr. Janna Arch. Pt's IV site D/c'd and WNL. Pt's VS stable at this time. Pt provided with home medication list and discharge instructions. Pt verbalized understanding. Pt ambulated off floor with steady gait in stable condition accompanied by RN.

## 2013-07-30 NOTE — Discharge Summary (Signed)
[id number] 

## 2013-07-31 NOTE — Discharge Summary (Signed)
NAMETRENNON, TORBECK NO.:  [id number]  MEDICAL RECORD NO.:  [id number]  LOCATION:                                 FACILITY:  PHYSICIAN:  Melvyn Novas, MDDATE OF BIRTH:  December 15, 1975  DATE OF ADMISSION:  07/27/2013 DATE OF DISCHARGE:  10/02/2014LH                              DISCHARGE SUMMARY   The patient is a 37 year old, white male with known bullous emphysema, continued smoking, COPD, history of seizure disorder, history of ethanol abuse, who was admitted with exacerbation of alcohol pancreatitis.  His initial lipase was 219.  He has severe epigastric pain, was admitted, given alcohol detox protocol consisting of thiamine and Ativan and fluids.  He did well from ethanol cessation perspective.  He had severe pain, was controlled Dilaudid intravenously.  His lipase was trended down over 4 days and he was able to tolerate dysphagia 3 diet with no exacerbation of epigastric pain and discomfort.  However, his lipase bumped a little on day of discharge is 70.  However, patient was highly motivated to go home.  He was cautioned about alcohol cessation and to attend AA for the time.  The patient seems to understand this.  His discharge medicines include Proventil HFA 2 puffs q.i.d., Keppra 500 mg p.o. b.i.d., multivitamin 1 p.o. b.i.d., Percocet 10/325 p.o. q.i.d. p.r.n. for compression fracture of thoracic spine.  He has all these medicines at home, and will follow up in the office within 4 days time for assessment of LFTs, lipase, and clinical situation.     Melvyn Novas, MD     RMD/MEDQ  D:  07/30/2013  T:  07/31/2013  Job:  [id number]

## 2013-09-09 ENCOUNTER — Encounter (HOSPITAL_COMMUNITY): Payer: Self-pay | Admitting: Emergency Medicine

## 2013-09-09 ENCOUNTER — Emergency Department (HOSPITAL_COMMUNITY)
Admission: EM | Admit: 2013-09-09 | Discharge: 2013-09-09 | Disposition: A | Discharge status: Home / Self Care | Payer: Medicaid Other | Attending: Emergency Medicine | Admitting: Emergency Medicine

## 2013-09-09 DIAGNOSIS — K2970 Gastritis, unspecified, without bleeding: Secondary | ICD-10-CM

## 2013-09-09 DIAGNOSIS — F17200 Nicotine dependence, unspecified, uncomplicated: Secondary | ICD-10-CM | POA: Insufficient documentation

## 2013-09-09 DIAGNOSIS — Z8679 Personal history of other diseases of the circulatory system: Secondary | ICD-10-CM | POA: Insufficient documentation

## 2013-09-09 DIAGNOSIS — Z79899 Other long term (current) drug therapy: Secondary | ICD-10-CM | POA: Insufficient documentation

## 2013-09-09 DIAGNOSIS — Z8701 Personal history of pneumonia (recurrent): Secondary | ICD-10-CM | POA: Insufficient documentation

## 2013-09-09 DIAGNOSIS — Z862 Personal history of diseases of the blood and blood-forming organs and certain disorders involving the immune mechanism: Secondary | ICD-10-CM | POA: Insufficient documentation

## 2013-09-09 DIAGNOSIS — J441 Chronic obstructive pulmonary disease with (acute) exacerbation: Secondary | ICD-10-CM | POA: Insufficient documentation

## 2013-09-09 DIAGNOSIS — G40909 Epilepsy, unspecified, not intractable, without status epilepticus: Secondary | ICD-10-CM | POA: Insufficient documentation

## 2013-09-09 DIAGNOSIS — R109 Unspecified abdominal pain: Secondary | ICD-10-CM

## 2013-09-09 LAB — CBC WITH DIFFERENTIAL/PLATELET
Basophils Absolute: 0 10*3/uL (ref 0.0–0.1)
Basophils Relative: 0 % (ref 0–1)
Eosinophils Absolute: 0.1 10*3/uL (ref 0.0–0.7)
Eosinophils Relative: 1 % (ref 0–5)
HCT: 47.4 % (ref 39.0–52.0)
Hemoglobin: 15.5 g/dL (ref 13.0–17.0)
MCH: 35 pg — ABNORMAL HIGH (ref 26.0–34.0)
MCHC: 32.7 g/dL (ref 30.0–36.0)
MCV: 107 fL — ABNORMAL HIGH (ref 78.0–100.0)
Monocytes Absolute: 0.9 10*3/uL (ref 0.1–1.0)
Monocytes Relative: 10 % (ref 3–12)
Platelets: 262 10*3/uL (ref 150–400)

## 2013-09-09 LAB — COMPREHENSIVE METABOLIC PANEL
AST: 26 U/L (ref 0–37)
Albumin: 4 g/dL (ref 3.5–5.2)
Alkaline Phosphatase: 141 U/L — ABNORMAL HIGH (ref 39–117)
BUN: 4 mg/dL — ABNORMAL LOW (ref 6–23)
Calcium: 9.6 mg/dL (ref 8.4–10.5)
Chloride: 102 mEq/L (ref 96–112)
Creatinine, Ser: 0.47 mg/dL — ABNORMAL LOW (ref 0.50–1.35)
Sodium: 140 mEq/L (ref 135–145)
Total Bilirubin: 0.4 mg/dL (ref 0.3–1.2)
Total Protein: 8.5 g/dL — ABNORMAL HIGH (ref 6.0–8.3)

## 2013-09-09 LAB — URINALYSIS, ROUTINE W REFLEX MICROSCOPIC
Bilirubin Urine: NEGATIVE
Ketones, ur: NEGATIVE mg/dL
Leukocytes, UA: NEGATIVE
Nitrite: NEGATIVE
Urobilinogen, UA: 0.2 mg/dL (ref 0.0–1.0)
pH: 5.5 (ref 5.0–8.0)

## 2013-09-09 LAB — TROPONIN I: Troponin I: <0.3 ng/mL (ref <0.30)

## 2013-09-09 MED ORDER — ALBUTEROL SULFATE (2.5 MG/3ML) 0.083% IN NEBU: 2.5000 mg | INHALATION_SOLUTION | RESPIRATORY_TRACT | Status: DC | PRN

## 2013-09-09 MED ORDER — ALBUTEROL SULFATE HFA 108 (90 BASE) MCG/ACT IN AERS: 2.0000 | INHALATION_SPRAY | RESPIRATORY_TRACT | Status: DC | PRN

## 2013-09-09 MED ORDER — SUCRALFATE 1 GM/10ML PO SUSP: 1.0000 g | Freq: Three times a day (TID) | ORAL | Status: DC

## 2013-09-09 MED ORDER — IPRATROPIUM BROMIDE 0.02 % IN SOLN
0.5000 mg | Freq: Once | RESPIRATORY_TRACT | Status: AC
  Administered 2013-09-09: 0.5 mg via RESPIRATORY_TRACT
  Filled 2013-09-09: qty 2.5

## 2013-09-09 MED ORDER — HYDROCODONE-ACETAMINOPHEN 5-325 MG PO TABS: 2.0000 | ORAL_TABLET | ORAL | Status: DC | PRN

## 2013-09-09 MED ORDER — ALBUTEROL SULFATE (5 MG/ML) 0.5% IN NEBU
2.5000 mg | INHALATION_SOLUTION | Freq: Once | RESPIRATORY_TRACT | Status: AC
  Administered 2013-09-09: 2.5 mg via RESPIRATORY_TRACT
  Filled 2013-09-09: qty 0.5

## 2013-09-09 MED ORDER — RANITIDINE HCL 150 MG PO TABS: 150.0000 mg | ORAL_TABLET | Freq: Two times a day (BID) | ORAL | Status: DC

## 2013-09-09 MED ORDER — MORPHINE SULFATE 4 MG/ML IJ SOLN
4.0000 mg | Freq: Once | INTRAMUSCULAR | Status: AC
  Administered 2013-09-09: 4 mg via INTRAVENOUS
  Filled 2013-09-09: qty 1

## 2013-09-09 MED ORDER — ONDANSETRON HCL 4 MG/2ML IJ SOLN
4.0000 mg | Freq: Once | INTRAMUSCULAR | Status: AC
  Administered 2013-09-09: 4 mg via INTRAVENOUS
  Filled 2013-09-09: qty 2

## 2013-09-09 NOTE — ED Notes (Signed)
Pt c/o pain to ruq and around navel x 2 days. Denies n/v/d. lnbm today, denies blood in stool. Nad.

## 2013-09-09 NOTE — ED Notes (Signed)
Pt reports that he received his flu and pneumonia vaccinations in September when he was admitted.

## 2013-09-09 NOTE — ED Notes (Signed)
Pt request 2 mg of dilaudid. States what the doctor upstairs gives him

## 2013-09-09 NOTE — ED Provider Notes (Signed)
CSN: [id number]     Arrival date & time 09/09/13  1503 History   First MD Initiated Contact with Patient 09/09/13 1722     Chief Complaint  Patient presents with  . Abdominal Pain   (Consider location/radiation/quality/duration/timing/severity/associated sxs/prior Treatment) HPI Comments: Patient presents to ER for evaluation of abdominal pain. Patient reports that he has been experiencing pain in his upper abdomen for the last 2 days. Pain is in the center and right side of the abdomen. Reports that this is similar to when he has had pancreatitis. No nausea, vomiting, diarrhea or constipation. He has not had any fever. He admits to daily alcohol intake, no more or less than usual. Pain is constant, moderate to severe.  Patient is a 37 y.o. male presenting with abdominal pain.  Abdominal Pain Associated symptoms: cough     Past Medical History  Diagnosis Date  . Pancreatitis   . Seizures   . Back pain   . ETOH abuse   . COPD (chronic obstructive pulmonary disease)   . SAH (subarachnoid hemorrhage)   . Macrocytosis 08/17/2011  . Bradycardia 08/17/2011  . Pneumonia    Past Surgical History  Procedure Laterality Date  . Back surgery    . Septoplasty     Family History  Problem Relation Age of Onset  . Seizures Father   . Alcohol abuse Father   . Coronary artery disease Mother     deceased age 22   History  Substance Use Topics  . Smoking status: Current Every Day Smoker -- 1.00 packs/day for 20 years    Types: Cigarettes  . Smokeless tobacco: Never Used  . Alcohol Use: 25.2 oz/week    42 Cans of beer per week    Review of Systems  Respiratory: Positive for cough and wheezing.   Cardiovascular: Negative.   Gastrointestinal: Positive for abdominal pain.  All other systems reviewed and are negative.    Allergies  Review of patient's allergies indicates no known allergies.  Home Medications   Current Outpatient Rx  Name  Route  Sig  Dispense  Refill  .  albuterol (PROVENTIL HFA;VENTOLIN HFA) 108 (90 BASE) MCG/ACT inhaler   Inhalation   Inhale 1-2 puffs into the lungs every 6 (six) hours as needed for wheezing.   1 Inhaler   0   . albuterol (PROVENTIL) (2.5 MG/3ML) 0.083% nebulizer solution   Nebulization   Take 2.5 mg by nebulization every 6 (six) hours as needed for wheezing or shortness of breath.         . levETIRAcetam (KEPPRA) 500 MG tablet   Oral   Take 500 mg by mouth 2 (two) times daily.          . Multiple Vitamin (MULTIVITAMIN WITH MINERALS) TABS tablet   Oral   Take 1 tablet by mouth daily.         Marland Kitchen oxyCODONE-acetaminophen (PERCOCET) 10-325 MG per tablet   Oral   Take 1 tablet by mouth every 4 (four) hours as needed for pain.   30 tablet   0    BP 138/87  Pulse 105  Temp(Src) 97.6 F (36.4 C) (Oral)  Resp 20  Ht 5\' 9"  (1.753 m)  Wt 120 lb (54.432 kg)  BMI 17.71 kg/m2  SpO2 97% Physical Exam  Constitutional: He is oriented to person, place, and time. He appears well-developed and well-nourished. No distress.  HENT:  Head: Normocephalic and atraumatic.  Right Ear: Hearing normal.  Left Ear: Hearing normal.  Nose:  Nose normal.  Mouth/Throat: Oropharynx is clear and moist and mucous membranes are normal.  Eyes: Conjunctivae and EOM are normal. Pupils are equal, round, and reactive to light.  Neck: Normal range of motion. Neck supple.  Cardiovascular: Regular rhythm, S1 normal and S2 normal.  Exam reveals no gallop and no friction rub.   No murmur heard. Pulmonary/Chest: Effort normal. No accessory muscle usage. Not tachypneic. No respiratory distress. He has no decreased breath sounds. He has wheezes. He has no rhonchi. He has no rales. He exhibits no tenderness.  Abdominal: Soft. Normal appearance and bowel sounds are normal. There is no hepatosplenomegaly. There is tenderness in the right upper quadrant, epigastric area and left upper quadrant. There is no rebound, no guarding, no tenderness at  McBurney's point and negative Murphy's sign. No hernia.  Musculoskeletal: Normal range of motion.  Neurological: He is alert and oriented to person, place, and time. He has normal strength. No cranial nerve deficit or sensory deficit. Coordination normal. GCS eye subscore is 4. GCS verbal subscore is 5. GCS motor subscore is 6.  Skin: Skin is warm, dry and intact. No rash noted. No cyanosis.  Psychiatric: He has a normal mood and affect. His speech is normal and behavior is normal. Thought content normal.    ED Course  Procedures (including critical care time) Labs Review Labs Reviewed  CBC WITH DIFFERENTIAL - Abnormal; Notable for the following:    MCV 107.0 (*)    MCH 35.0 (*)    All other components within normal limits  COMPREHENSIVE METABOLIC PANEL - Abnormal; Notable for the following:    BUN 4 (*)    Creatinine, Ser 0.47 (*)    Total Protein 8.5 (*)    Alkaline Phosphatase 141 (*)    All other components within normal limits  TROPONIN I  LIPASE, BLOOD  URINALYSIS, ROUTINE W REFLEX MICROSCOPIC   Imaging Review No results found.  EKG Interpretation     Ventricular Rate:  66 PR Interval:  180 QRS Duration: 98 QT Interval:  438 QTC Calculation: 459 R Axis:   82 Text Interpretation:  Normal sinus rhythm with sinus arrhythmia Incomplete right bundle branch block Borderline ECG When compared with ECG of 19-May-2013 22:35, No significant change was found            MDM  Diagnoses: 1. Abdominal pain, likely alcoholic gastritis 2. COPD exacerbation, mild  Patient presents to the ER for evaluation of abdominal pain. Patient complaining of pain in the epigastric and right upper abdominal region. He reports that symptoms are system with what he has had pancreatitis. Patient admits to daily alcohol intake. He has not had any hematemesis or rectal bleeding, denies melena. Exam reveals diffuse upper abdominal tenderness. No lower abdominal tenderness to be concerned about  diverticulitis, appendicitis. Patient's LFTs are normal. Lipase is normal. No concern for acute pancreatitis. Patient had ultrasound performed 2 months ago that did not show any gallbladder disease, unlikely and does not require further testing. Symptoms most consistent with a gastritis secondary to alcohol intake.  Patient also noted to have mild wheezing upon arrival. He does have a history of COPD. Patient treated with bronchodilator therapy. Continue bronchodilators as an outpatient. Clinical concern for pneumonia is very low, does not require imaging.    Gilda Crease, MD 09/09/13 [phone]

## 2013-09-13 IMAGING — CR DG RIBS 2V*R*
3 series · 3 of 3 positions shown · non-contrast
Comparison: Two-view chest x-ray of the same day.  Two-view chest x-
ray 10/26/2012.

CLINICAL DATA: Right-sided chest pain.

RIGHT RIBS - 2 VIEW

[view not recorded (1 of 3)]
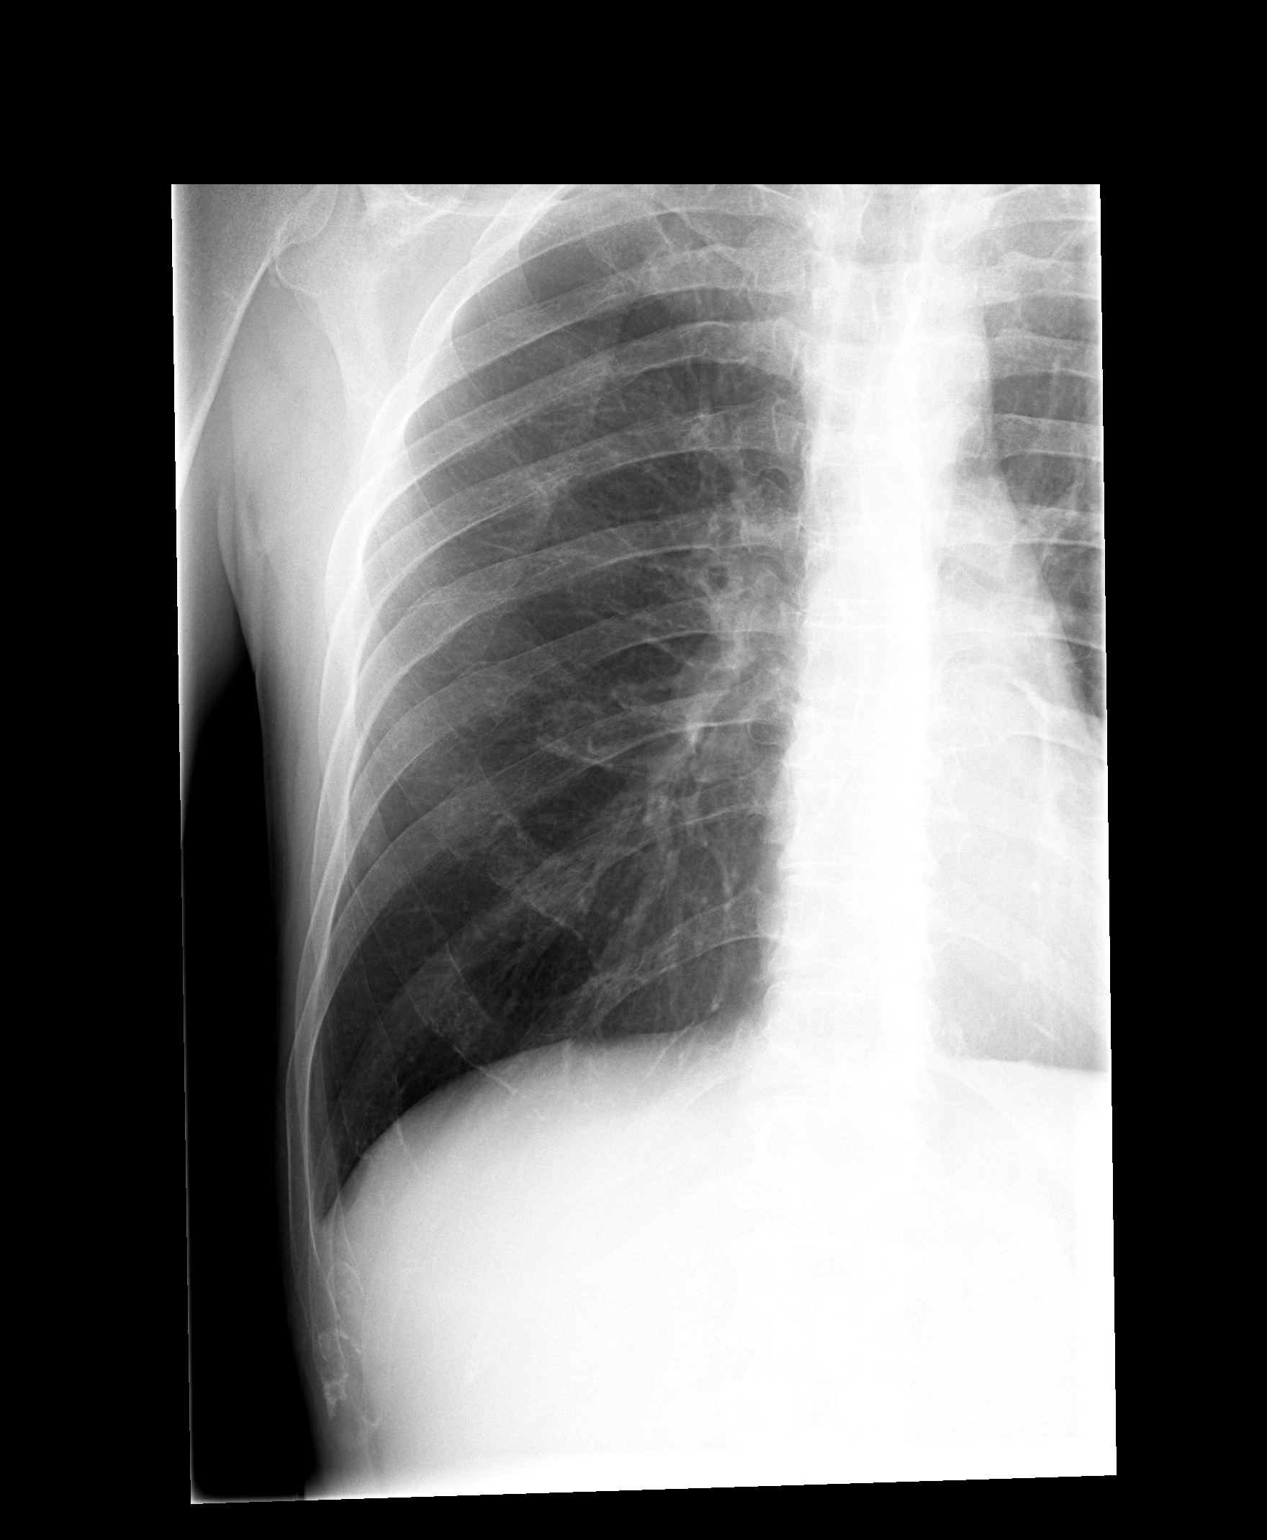

[view not recorded (2 of 3)]
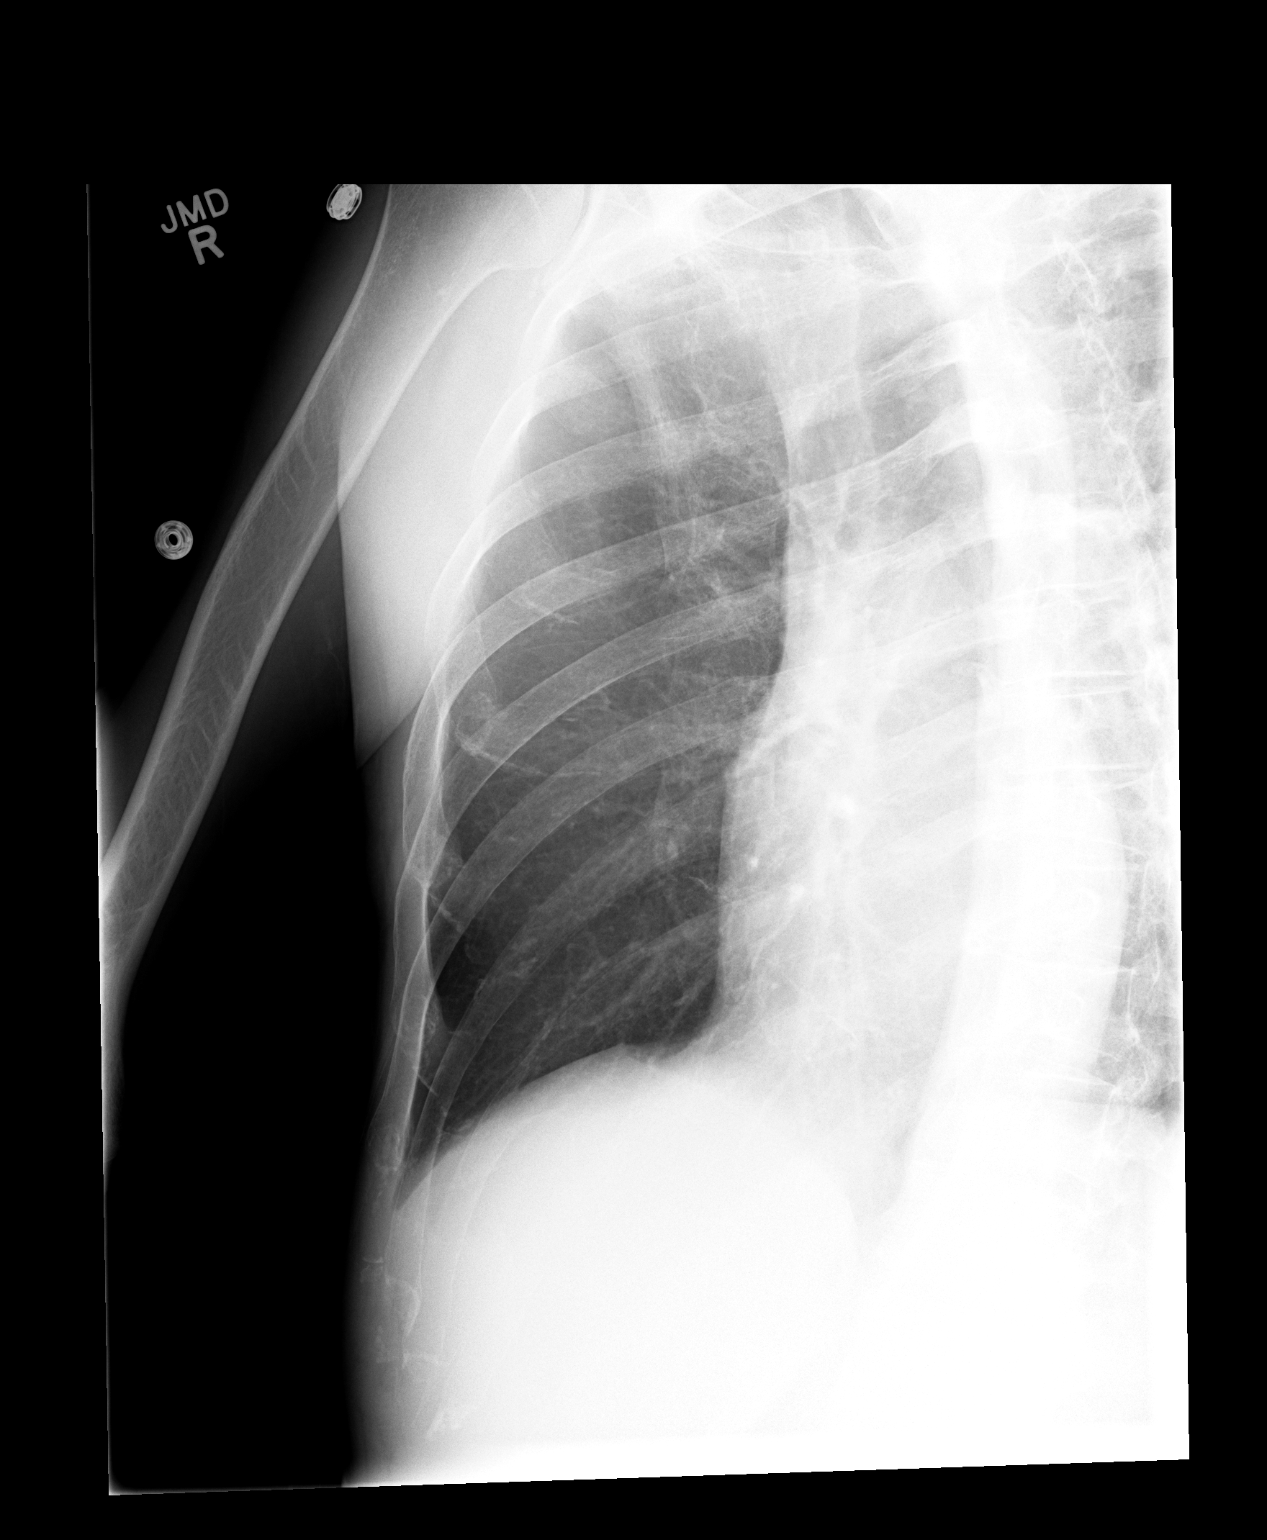

[view not recorded (3 of 3)]
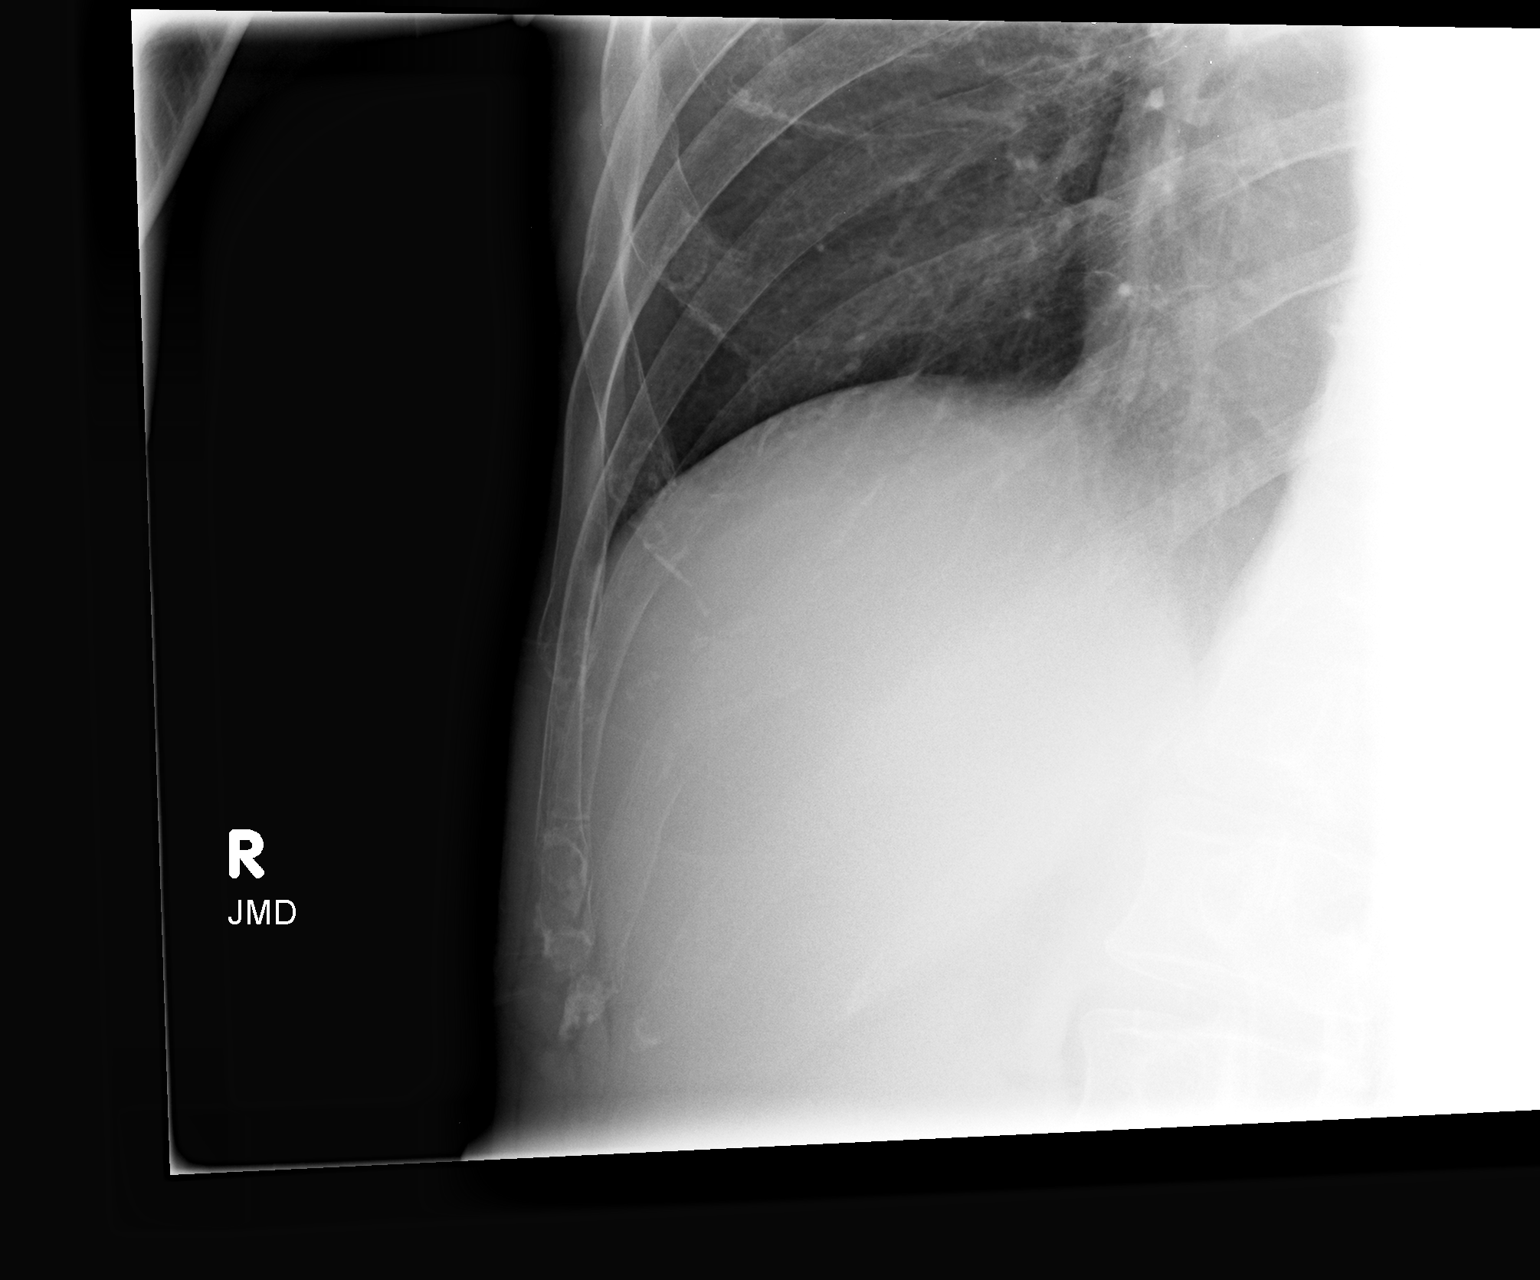

[3 of 3 positions shown; findings below may reference images not displayed]

FINDINGS: Deformities of the posterolateral right seventh and
eighth ribs are stable.  No acute or healing fractures are present.
There is no pneumothorax.  Emphysematous changes are stable.
IMPRESSION: 1.  No acute or healing fractures.

## 2013-10-20 ENCOUNTER — Encounter (HOSPITAL_COMMUNITY): Payer: Self-pay | Admitting: Emergency Medicine

## 2013-10-20 ENCOUNTER — Emergency Department (HOSPITAL_COMMUNITY)
Admission: EM | Admit: 2013-10-20 | Discharge: 2013-10-20 | Disposition: A | Discharge status: Home / Self Care | Payer: Medicaid Other | Attending: Emergency Medicine | Admitting: Emergency Medicine

## 2013-10-20 ENCOUNTER — Emergency Department (HOSPITAL_COMMUNITY): Payer: Medicaid Other

## 2013-10-20 DIAGNOSIS — Z8679 Personal history of other diseases of the circulatory system: Secondary | ICD-10-CM | POA: Insufficient documentation

## 2013-10-20 DIAGNOSIS — F17200 Nicotine dependence, unspecified, uncomplicated: Secondary | ICD-10-CM | POA: Insufficient documentation

## 2013-10-20 DIAGNOSIS — G40909 Epilepsy, unspecified, not intractable, without status epilepticus: Secondary | ICD-10-CM | POA: Insufficient documentation

## 2013-10-20 DIAGNOSIS — Z8701 Personal history of pneumonia (recurrent): Secondary | ICD-10-CM | POA: Insufficient documentation

## 2013-10-20 DIAGNOSIS — Z862 Personal history of diseases of the blood and blood-forming organs and certain disorders involving the immune mechanism: Secondary | ICD-10-CM | POA: Insufficient documentation

## 2013-10-20 DIAGNOSIS — J441 Chronic obstructive pulmonary disease with (acute) exacerbation: Secondary | ICD-10-CM | POA: Insufficient documentation

## 2013-10-20 DIAGNOSIS — Z79899 Other long term (current) drug therapy: Secondary | ICD-10-CM | POA: Insufficient documentation

## 2013-10-20 DIAGNOSIS — K859 Acute pancreatitis without necrosis or infection, unspecified: Secondary | ICD-10-CM | POA: Insufficient documentation

## 2013-10-20 LAB — CBC WITH DIFFERENTIAL/PLATELET
Basophils Absolute: 0 10*3/uL (ref 0.0–0.1)
Basophils Relative: 0 % (ref 0–1)
Eosinophils Absolute: 0.2 10*3/uL (ref 0.0–0.7)
Eosinophils Relative: 2 % (ref 0–5)
Hemoglobin: 16 g/dL (ref 13.0–17.0)
Lymphs Abs: 2.8 10*3/uL (ref 0.7–4.0)
MCH: 35 pg — ABNORMAL HIGH (ref 26.0–34.0)
MCHC: 34.3 g/dL (ref 30.0–36.0)
MCV: 102.2 fL — ABNORMAL HIGH (ref 78.0–100.0)
Monocytes Absolute: 1.1 10*3/uL — ABNORMAL HIGH (ref 0.1–1.0)
Neutrophils Relative %: 60 % (ref 43–77)
Platelets: 318 10*3/uL (ref 150–400)
RBC: 4.57 MIL/uL (ref 4.22–5.81)
RDW: 12.2 % (ref 11.5–15.5)

## 2013-10-20 LAB — COMPREHENSIVE METABOLIC PANEL
ALT: 25 U/L (ref 0–53)
AST: 48 U/L — ABNORMAL HIGH (ref 0–37)
Albumin: 4.1 g/dL (ref 3.5–5.2)
Alkaline Phosphatase: 126 U/L — ABNORMAL HIGH (ref 39–117)
Creatinine, Ser: 0.46 mg/dL — ABNORMAL LOW (ref 0.50–1.35)
Potassium: 4 mEq/L (ref 3.5–5.1)
Sodium: 131 mEq/L — ABNORMAL LOW (ref 135–145)
Total Protein: 8.1 g/dL (ref 6.0–8.3)

## 2013-10-20 MED ORDER — PROMETHAZINE HCL 25 MG PO TABS: 25.0000 mg | ORAL_TABLET | Freq: Four times a day (QID) | ORAL | Status: DC | PRN

## 2013-10-20 MED ORDER — IOHEXOL 300 MG/ML  SOLN
100.0000 mL | Freq: Once | INTRAMUSCULAR | Status: AC | PRN
  Administered 2013-10-20: 100 mL via INTRAVENOUS

## 2013-10-20 MED ORDER — ONDANSETRON HCL 4 MG/2ML IJ SOLN
4.0000 mg | Freq: Once | INTRAMUSCULAR | Status: AC
  Administered 2013-10-20: 4 mg via INTRAVENOUS
  Filled 2013-10-20: qty 2

## 2013-10-20 MED ORDER — IOHEXOL 300 MG/ML  SOLN
50.0000 mL | Freq: Once | INTRAMUSCULAR | Status: AC | PRN
  Administered 2013-10-20: 50 mL via ORAL

## 2013-10-20 MED ORDER — HYDROMORPHONE HCL PF 1 MG/ML IJ SOLN
1.0000 mg | Freq: Once | INTRAMUSCULAR | Status: AC
  Administered 2013-10-20: 1 mg via INTRAVENOUS
  Filled 2013-10-20: qty 1

## 2013-10-20 MED ORDER — PANTOPRAZOLE SODIUM 40 MG IV SOLR
40.0000 mg | Freq: Once | INTRAVENOUS | Status: AC
  Administered 2013-10-20: 40 mg via INTRAVENOUS
  Filled 2013-10-20: qty 40

## 2013-10-20 MED ORDER — HYDROMORPHONE HCL PF 1 MG/ML IJ SOLN
0.5000 mg | Freq: Once | INTRAMUSCULAR | Status: AC
  Administered 2013-10-20: 0.5 mg via INTRAVENOUS
  Filled 2013-10-20: qty 1

## 2013-10-20 MED ORDER — SODIUM CHLORIDE 0.9 % IV BOLUS (SEPSIS)
1000.0000 mL | Freq: Once | INTRAVENOUS | Status: AC
  Administered 2013-10-20: 1000 mL via INTRAVENOUS

## 2013-10-20 MED ORDER — HYDROMORPHONE HCL 4 MG PO TABS: 4.0000 mg | ORAL_TABLET | Freq: Four times a day (QID) | ORAL | Status: DC | PRN

## 2013-10-20 NOTE — ED Notes (Signed)
Pt reports severe ab. Pain that started last night after drinking alcohol. He has chronic pancreatitis and has been told to stop drinking. Took his own percocet 2 hours ago.

## 2013-10-20 NOTE — ED Provider Notes (Signed)
CSN: [id number]     Arrival date & time 10/20/13  4098 History  This chart was scribed for Benny Lennert, MD, by Yevette Edwards, ED Scribe. This patient was seen in room APA08/APA08 and the patient's care was started at 7:57 AM.   None    Chief Complaint  Patient presents with  . Abdominal Pain    Patient is a 37 y.o. male presenting with abdominal pain. The history is provided by the patient and medical records. No language interpreter was used.  Abdominal Pain Pain location:  Epigastric Pain quality: burning   Pain radiates to:  Does not radiate Pain severity:  Severe (10/10) Duration:  1 day Timing:  Constant Progression:  Unchanged Chronicity:  Chronic Context: alcohol use   Relieved by:  Nothing Worsened by:  Nothing tried Ineffective treatments: Percocet. Associated symptoms: no diarrhea, no fever and no vomiting   Risk factors: alcohol abuse    HPI Comments: [name] is a 37 y.o. male, with a h/o pancreatitis and alcohol abuse, who presents to the Emergency Department complaining of constant epigastric abdominal pain which began yesterday evening after drinking alcohol. He rates the pain as 10/10, and he characterizes the pain as "burning." The pt treated his symptoms with percocet two hours ago with minimal relief. He was treated at the ED approximately five weeks ago for similar symptoms. The pt denies emesis, hematemesis, and diarrhea. He is a current smoker.   Dr. Janna Arch is the pt's PCP.   Past Medical History  Diagnosis Date  . Pancreatitis   . Seizures   . Back pain   . ETOH abuse   . COPD (chronic obstructive pulmonary disease)   . SAH (subarachnoid hemorrhage)   . Macrocytosis 08/17/2011  . Bradycardia 08/17/2011  . Pneumonia    Past Surgical History  Procedure Laterality Date  . Back surgery    . Septoplasty     Family History  Problem Relation Age of Onset  . Seizures Father   . Alcohol abuse Father   . Coronary artery disease Mother      deceased age 4   History  Substance Use Topics  . Smoking status: Current Every Day Smoker -- 1.00 packs/day for 20 years    Types: Cigarettes  . Smokeless tobacco: Never Used  . Alcohol Use: 25.2 oz/week    42 Cans of beer per week    Review of Systems  Constitutional: Negative for fever.  Gastrointestinal: Positive for abdominal pain. Negative for vomiting and diarrhea.  All other systems reviewed and are negative.    Allergies  Review of patient's allergies indicates no known allergies.  Home Medications   Current Outpatient Rx  Name  Route  Sig  Dispense  Refill  . albuterol (PROVENTIL HFA;VENTOLIN HFA) 108 (90 BASE) MCG/ACT inhaler   Inhalation   Inhale 1-2 puffs into the lungs every 6 (six) hours as needed for wheezing.   1 Inhaler   0   . albuterol (PROVENTIL HFA;VENTOLIN HFA) 108 (90 BASE) MCG/ACT inhaler   Inhalation   Inhale 2 puffs into the lungs every 4 (four) hours as needed for wheezing or shortness of breath.   1 Inhaler   0   . albuterol (PROVENTIL) (2.5 MG/3ML) 0.083% nebulizer solution   Nebulization   Take 2.5 mg by nebulization every 6 (six) hours as needed for wheezing or shortness of breath.         Marland Kitchen albuterol (PROVENTIL) (2.5 MG/3ML) 0.083% nebulizer solution  Nebulization   Take 3 mLs (2.5 mg total) by nebulization every 4 (four) hours as needed for wheezing or shortness of breath.   30 vial   0   . levETIRAcetam (KEPPRA) 500 MG tablet   Oral   Take 500 mg by mouth 2 (two) times daily.          . Multiple Vitamin (MULTIVITAMIN WITH MINERALS) TABS tablet   Oral   Take 1 tablet by mouth daily.         Marland Kitchen oxyCODONE-acetaminophen (PERCOCET) 10-325 MG per tablet   Oral   Take 1 tablet by mouth every 4 (four) hours as needed for pain.   30 tablet   0   . ranitidine (ZANTAC) 150 MG tablet   Oral   Take 1 tablet (150 mg total) by mouth 2 (two) times daily.   60 tablet   0   . sucralfate (CARAFATE) 1 GM/10ML suspension    Oral   Take 10 mLs (1 g total) by mouth 4 (four) times daily -  with meals and at bedtime.   420 mL   0    Triage Vitals: BP 153/96  Pulse 99  Temp(Src) 97.8 F (36.6 C) (Oral)  Resp 22  Ht 5\' 9"  (1.753 m)  Wt 120 lb (54.432 kg)  BMI 17.71 kg/m2  SpO2 100%  Physical Exam  Nursing note and vitals reviewed. Constitutional: He is oriented to person, place, and time. He appears well-developed.  HENT:  Head: Normocephalic.  Eyes: Conjunctivae and EOM are normal. No scleral icterus.  Neck: Neck supple. No thyromegaly present.  Cardiovascular: Normal rate and regular rhythm.  Exam reveals no gallop and no friction rub.   No murmur heard. Pulmonary/Chest: No stridor. He has wheezes. He has no rales. He exhibits no tenderness.  Moderate bilateral wheezing.   Abdominal: Soft. He exhibits no distension. There is tenderness. There is no rebound.  Moderate epigastric tenderness.  Musculoskeletal: Normal range of motion. He exhibits no edema.  Lymphadenopathy:    He has no cervical adenopathy.  Neurological: He is oriented to person, place, and time. He exhibits normal muscle tone. Coordination normal.  Skin: No rash noted. No erythema.  Psychiatric: He has a normal mood and affect. His behavior is normal.    ED Course  Procedures (including critical care time)  DIAGNOSTIC STUDIES: Oxygen Saturation is 100% on room air, normal by my interpretation.    COORDINATION OF CARE:  8:03 AM- Discussed treatment plan with patient, and the patient agreed to the plan.   9:05 AM- Rechecked pt.   Labs Review  Results for orders placed during the hospital encounter of 10/20/13  CBC WITH DIFFERENTIAL      Result Value Range   WBC 10.3  4.0 - 10.5 K/uL   RBC 4.57  4.22 - 5.81 MIL/uL   Hemoglobin 16.0  13.0 - 17.0 g/dL   HCT 40.9  81.1 - 91.4 %   MCV 102.2 (*) 78.0 - 100.0 fL   MCH 35.0 (*) 26.0 - 34.0 pg   MCHC 34.3  30.0 - 36.0 g/dL   RDW 78.2  95.6 - 21.3 %   Platelets 318  150 - 400  K/uL   Neutrophils Relative % 60  43 - 77 %   Neutro Abs 6.2  1.7 - 7.7 K/uL   Lymphocytes Relative 27  12 - 46 %   Lymphs Abs 2.8  0.7 - 4.0 K/uL   Monocytes Relative 11  3 - 12 %  Monocytes Absolute 1.1 (*) 0.1 - 1.0 K/uL   Eosinophils Relative 2  0 - 5 %   Eosinophils Absolute 0.2  0.0 - 0.7 K/uL   Basophils Relative 0  0 - 1 %   Basophils Absolute 0.0  0.0 - 0.1 K/uL  COMPREHENSIVE METABOLIC PANEL      Result Value Range   Sodium 131 (*) 135 - 145 mEq/L   Potassium 4.0  3.5 - 5.1 mEq/L   Chloride 94 (*) 96 - 112 mEq/L   CO2 23  19 - 32 mEq/L   Glucose, Bld 180 (*) 70 - 99 mg/dL   BUN 4 (*) 6 - 23 mg/dL   Creatinine, Ser 1.61 (*) 0.50 - 1.35 mg/dL   Calcium 9.2  8.4 - 09.6 mg/dL   Total Protein 8.1  6.0 - 8.3 g/dL   Albumin 4.1  3.5 - 5.2 g/dL   AST 48 (*) 0 - 37 U/L   ALT 25  0 - 53 U/L   Alkaline Phosphatase 126 (*) 39 - 117 U/L   Total Bilirubin 0.3  0.3 - 1.2 mg/dL   GFR calc non Af Amer >90  >90 mL/min   GFR calc Af Amer >90  >90 mL/min  LIPASE, BLOOD      Result Value Range   Lipase 18  11 - 59 U/L   Ct Abdomen Pelvis W Contrast  10/20/2013   CLINICAL DATA:  Abdominal pain, history of pancreatitis  EXAM: CT ABDOMEN AND PELVIS WITH CONTRAST  TECHNIQUE: Multidetector CT imaging of the abdomen and pelvis was performed using the standard protocol following bolus administration of intravenous contrast.  CONTRAST:  50mL OMNIPAQUE IOHEXOL 300 MG/ML SOLN, OMNIPAQUE IOHEXOL 300 MG/ML SOLN  COMPARISON:  CT scan the abdomen pelvis dated February 22, 2013.  FINDINGS: The liver exhibits no focal mass nor ductal dilation. The gallbladder is adequately distended with no evidence of stones or wall thickening or pericholecystic fluid. The pancreas exhibits normal density and contour. There is slight haziness of the peripancreatic fat adjacent to the pancreatic head. This is not as conspicuous as on the previous study but may reflect low-grade inflammation. There are a few borderline  enlarged peripancreatic lymph nodes which appear stable. The spleen is not enlarged. There is a 9 mm diameter accessory spleen which appears stable.  The caliber of the abdominal aorta is normal. The periaortic and pericaval regions exhibit no lymphadenopathy. The adrenal glands and kidneys exhibit no acute abnormalities. The stomach is moderately distended with the oral contrast. Contrast has traversed portions of the normal-appearing small bowel but has not yet reached the colon. There is no evidence of a small or large bowel obstruction. There is no evidence of colitis nor of diverticulitis. A normal calibered, uninflamed-appearing appendix is noted in the upper aspect of the pelvis. The urinary bladder is moderately distended. The prostate gland and seminal vesicles appear normal. There is no inguinal nor umbilical hernia.  The lung bases are clear. The lumbar vertebral bodies are preserved in height. There is posterior bulging of disc material with endplate spurring noted at L4-5 and at L5-S1.  IMPRESSION: 1. There may be minimal inflammatory change associated with the pancreatic head as might be seen with recurrent pancreatitis. There is no pancreatic mass nor ductal dilation. The liver and gallbladder and spleen exhibit no acute abnormalities either. Correlation with the the patient's clinical and laboratory values is needed. 2. There is no evidence of a small or large bowel obstruction. A normal  calibered, uninflamed-appearing appendix is demonstrated. There is no evidence of colitis nor of diverticulitis. 3. There is no acute urinary tract abnormality. The urinary bladder is moderately distended however. 4. There is no intra abdominal nor pelvic mass nor lymphadenopathy. 5. The lung bases are clear. There is bulging disc material posteriorly at the L4-5 and L5-S1 levels.   Electronically Signed   By: David  [place]   On: 10/20/2013 10:22   Dg Abd Acute W/chest  10/20/2013   CLINICAL DATA:  Severe  abdominal pain  EXAM: ACUTE ABDOMEN SERIES (ABDOMEN 2 VIEW & CHEST 1 VIEW)  COMPARISON:  05/19/2013  FINDINGS: The lungs are hyperinflated and. The cardiac silhouette is normal limits. The osseous structures unremarkable. Stable interstitial prominence is appreciated as well as areas of apical pleural scarring left greater than right. Areas of lucency within the lung bases.  Air is seen within nondilated loops of large and small bowel. The osseous structures demonstrate no evidence of fracture or dislocation.  IMPRESSION: Stable chronic changes within the chest, findings consistent COPD, underlying component of interstitial changes, and biapical pleural scarring. Nonobstructive bowel gas pattern.   Electronically Signed   By: Salome Holmes M.D.   On: 10/20/2013 08:50       EKG Interpretation   None       MDM  Pancreatitis.     Benny Lennert, MD 10/20/13 [phone]

## 2013-12-03 ENCOUNTER — Telehealth: Payer: Self-pay | Admitting: General Practice

## 2013-12-03 NOTE — Telephone Encounter (Deleted)
Message copied by Burns Spain on Thu Dec 03, 2013  7:07 PM ------      Message from: Orpah Greek      Created: Wed Sep 09, 2013  7:17 PM      Regarding: Order for EDDIE, PAYETTE             Patient Name: [name], [name]([id number])      Sex: Male      DOB: 1976-06-06              PCP: [name], [place]: [facility]             Types of orders made on 09/09/2013: Consult, ECG, IV, Lab, Medications, Nursing            Order Date:09/09/2013      Ordering Bamberg, Gwenyth Allegra [[id number]]      Attending Provider:Christopher Hewitt Shorts, MD [phone]      Authorizing Provider: Orpah Greek, MD [phone]      [facility][[id number]]            Order Specific Information      Order: COPD clinic follow up [Custom: [id number]]  Order #: [id number]  Qty: 1        Priority: Routine  Class: Hospital Performed          Where does the patient receive follow up COPD care -> Other                 Follow up in -> 5-7 days               Released on: 09/09/2013  7:17 PM                    Priority: Routine  Class: Hospital Performed          Where does the patient receive follow up COPD care -> Other                 Follow up in -> 5-7 days               Released on: 09/09/2013  7:17 PM       ------

## 2013-12-07 ENCOUNTER — Emergency Department (HOSPITAL_COMMUNITY)
Admission: EM | Admit: 2013-12-07 | Discharge: 2013-12-08 | Disposition: A | Discharge status: Home / Self Care | Payer: Medicaid Other | Attending: Emergency Medicine | Admitting: Emergency Medicine

## 2013-12-07 ENCOUNTER — Encounter (HOSPITAL_COMMUNITY): Payer: Self-pay | Admitting: Emergency Medicine

## 2013-12-07 DIAGNOSIS — Z8701 Personal history of pneumonia (recurrent): Secondary | ICD-10-CM | POA: Insufficient documentation

## 2013-12-07 DIAGNOSIS — Z862 Personal history of diseases of the blood and blood-forming organs and certain disorders involving the immune mechanism: Secondary | ICD-10-CM | POA: Insufficient documentation

## 2013-12-07 DIAGNOSIS — F1010 Alcohol abuse, uncomplicated: Secondary | ICD-10-CM | POA: Insufficient documentation

## 2013-12-07 DIAGNOSIS — G8929 Other chronic pain: Secondary | ICD-10-CM

## 2013-12-07 DIAGNOSIS — Z79899 Other long term (current) drug therapy: Secondary | ICD-10-CM | POA: Insufficient documentation

## 2013-12-07 DIAGNOSIS — R1012 Left upper quadrant pain: Secondary | ICD-10-CM | POA: Insufficient documentation

## 2013-12-07 DIAGNOSIS — Z8679 Personal history of other diseases of the circulatory system: Secondary | ICD-10-CM | POA: Insufficient documentation

## 2013-12-07 DIAGNOSIS — R1011 Right upper quadrant pain: Secondary | ICD-10-CM | POA: Insufficient documentation

## 2013-12-07 DIAGNOSIS — R1013 Epigastric pain: Secondary | ICD-10-CM | POA: Insufficient documentation

## 2013-12-07 DIAGNOSIS — J449 Chronic obstructive pulmonary disease, unspecified: Secondary | ICD-10-CM | POA: Insufficient documentation

## 2013-12-07 DIAGNOSIS — G40909 Epilepsy, unspecified, not intractable, without status epilepticus: Secondary | ICD-10-CM | POA: Insufficient documentation

## 2013-12-07 DIAGNOSIS — F17200 Nicotine dependence, unspecified, uncomplicated: Secondary | ICD-10-CM | POA: Insufficient documentation

## 2013-12-07 DIAGNOSIS — R109 Unspecified abdominal pain: Secondary | ICD-10-CM

## 2013-12-07 DIAGNOSIS — J4489 Other specified chronic obstructive pulmonary disease: Secondary | ICD-10-CM | POA: Insufficient documentation

## 2013-12-07 DIAGNOSIS — Z8719 Personal history of other diseases of the digestive system: Secondary | ICD-10-CM | POA: Insufficient documentation

## 2013-12-07 MED ORDER — SODIUM CHLORIDE 0.9 % IV BOLUS (SEPSIS)
1000.0000 mL | Freq: Once | INTRAVENOUS | Status: AC
  Administered 2013-12-08: 1000 mL via INTRAVENOUS

## 2013-12-07 MED ORDER — HYDROMORPHONE HCL PF 1 MG/ML IJ SOLN
1.0000 mg | Freq: Once | INTRAMUSCULAR | Status: AC
  Administered 2013-12-08: 1 mg via INTRAVENOUS
  Filled 2013-12-07: qty 1

## 2013-12-07 NOTE — ED Notes (Signed)
Pt continues to drink, states probably has drank little more lately.

## 2013-12-07 NOTE — ED Provider Notes (Signed)
CSN: [id number]     Arrival date & time 12/07/13  2308 History  This chart was scribed for Julianne Rice, MD by Elby Beck, ED Scribe. This patient was seen in room APA10/APA10 and the patient's care was started at 11:30 PM.   Chief Complaint  Patient presents with  . Abdominal Pain    Patient is a 38 y.o. male presenting with abdominal pain. The history is provided by the patient. No language interpreter was used.  Abdominal Pain Pain location:  RUQ and LUQ Pain radiates to:  Does not radiate Pain severity:  Severe Onset quality:  Gradual Duration:  1 day Timing:  Constant Progression:  Worsening Chronicity:  New Context: alcohol use   Context: not recent travel   Context comment:  History of pancreatitis Relieved by:  None tried Worsened by:  Nothing tried Ineffective treatments:  None tried Associated symptoms: no chest pain, no chills, no diarrhea, no dysuria, no fever, no hematuria, no nausea and no vomiting     HPI Comments: [name] is a 38 y.o. male with a history of pancreatitis and alcohol abuse who presents to the Emergency Department complaining of upper abdominal pain onset earlier today, which worsened tonight. He reports that he had alcohol earlier today as well as last night and that he believes this caused a flare-up of his pancreatitis. He states that this pain feels similar to prior pancreatitis episodes, which have also been onset by alcohol use. He denies any surgical abdominal history. He states that Dilaudid normally helps his pancreatitis pain. He denies fever, chills, nausea, vomiting, diarrhea, blood in stool, chest pain or any other symptoms.    Past Medical History  Diagnosis Date  . Pancreatitis   . Seizures   . Back pain   . ETOH abuse   . COPD (chronic obstructive pulmonary disease)   . SAH (subarachnoid hemorrhage)   . Macrocytosis 08/17/2011  . Bradycardia 08/17/2011  . Pneumonia    Past Surgical History  Procedure Laterality  Date  . Back surgery    . Septoplasty     Family History  Problem Relation Age of Onset  . Seizures Father   . Alcohol abuse Father   . Coronary artery disease Mother     deceased age 90   History  Substance Use Topics  . Smoking status: Current Every Day Smoker -- 1.00 packs/day for 20 years    Types: Cigarettes  . Smokeless tobacco: Never Used  . Alcohol Use: 25.2 oz/week    42 Cans of beer per week    Review of Systems  Constitutional: Negative for fever and chills.  Cardiovascular: Negative for chest pain.  Gastrointestinal: Positive for abdominal pain. Negative for nausea, vomiting, diarrhea and blood in stool.  Genitourinary: Negative for dysuria, hematuria and flank pain.  Musculoskeletal: Negative for back pain.  All other systems reviewed and are negative.   Allergies  Review of patient's allergies indicates no known allergies.  Home Medications   Current Outpatient Rx  Name  Route  Sig  Dispense  Refill  . albuterol (PROVENTIL HFA;VENTOLIN HFA) 108 (90 BASE) MCG/ACT inhaler   Inhalation   Inhale 2 puffs into the lungs every 4 (four) hours as needed for wheezing or shortness of breath.   1 Inhaler   0   . albuterol (PROVENTIL) (2.5 MG/3ML) 0.083% nebulizer solution   Nebulization   Take 3 mLs (2.5 mg total) by nebulization every 4 (four) hours as needed for wheezing or shortness of  breath.   30 vial   0   . carbamazepine (TEGRETOL) 200 MG tablet   Oral   Take 400 mg by mouth 2 (two) times daily.         Marland Kitchen levETIRAcetam (KEPPRA) 500 MG tablet   Oral   Take 500 mg by mouth 2 (two) times daily.          . Multiple Vitamin (MULTIVITAMIN WITH MINERALS) TABS tablet   Oral   Take 1 tablet by mouth daily.         Marland Kitchen oxyCODONE-acetaminophen (PERCOCET) 10-325 MG per tablet   Oral   Take 1 tablet by mouth every 4 (four) hours as needed for pain.   30 tablet   0   . promethazine (PHENERGAN) 25 MG tablet   Oral   Take 1 tablet (25 mg total) by  mouth every 6 (six) hours as needed for nausea or vomiting.   15 tablet   0   . HYDROmorphone (DILAUDID) 4 MG tablet   Oral   Take 1 tablet (4 mg total) by mouth every 6 (six) hours as needed for severe pain.   20 tablet   0    Triage Vitals: BP 161/97  Pulse 78  Temp(Src) 97.6 F (36.4 C)  Resp 18  Ht 5\' 9"  (1.753 m)  Wt 120 lb (54.432 kg)  BMI 17.71 kg/m2  SpO2 96%  Physical Exam  Nursing note and vitals reviewed. Constitutional: He is oriented to person, place, and time. He appears well-developed and well-nourished. No distress.  HENT:  Head: Normocephalic and atraumatic.  Mouth/Throat: Oropharynx is clear and moist. No oropharyngeal exudate.  Eyes: EOM are normal. Pupils are equal, round, and reactive to light.  Neck: Normal range of motion. Neck supple.  Cardiovascular: Normal rate and regular rhythm.   Pulmonary/Chest: Effort normal and breath sounds normal. No respiratory distress. He has no wheezes. He has no rales.  Abdominal: Soft. Bowel sounds are normal. He exhibits no distension and no mass. There is tenderness (tender to palpation in the epigastric and right upper quadrant areas.). There is no rebound and no guarding.  Musculoskeletal: Normal range of motion. He exhibits no edema and no tenderness.  Neurological: He is alert and oriented to person, place, and time.  Skin: Skin is warm and dry. No rash noted. No erythema.  Psychiatric: He has a normal mood and affect. His behavior is normal.    ED Course  Procedures (including critical care time)  DIAGNOSTIC STUDIES: Oxygen Saturation is 96% on RA, normal by my interpretation.    COORDINATION OF CARE: 11:35 PM- Discussed plan to order IV fluids and Dilaudid. Will also obtain diagnostic lab work. Pt advised of plan for treatment and pt agrees.  Medications  sodium chloride 0.9 % bolus 1,000 mL (1,000 mLs Intravenous New Bag/Given 12/08/13 0017)  HYDROmorphone (DILAUDID) injection 1 mg (1 mg Intravenous  Given 12/08/13 0023)   Labs Review Labs Reviewed  CBC WITH DIFFERENTIAL  COMPREHENSIVE METABOLIC PANEL  LIPASE, BLOOD  URINALYSIS, ROUTINE W REFLEX MICROSCOPIC   Imaging Review No results found.  EKG Interpretation   None       MDM   Final diagnoses:  None   I personally performed the services described in this documentation, which was scribed in my presence. The recorded information has been reviewed and is accurate.  Patient's pain is improved. He is tolerating orals. Patient is advised to follow up with his primary Dr. He is also advised to decrease  the amount of alcohol he consumes. Return precautions have been given and patient voiced understanding.  Julianne Rice, MD 12/08/13 [phone]

## 2013-12-07 NOTE — ED Notes (Signed)
Pt c/o abd pain all day.

## 2013-12-08 LAB — URINALYSIS, ROUTINE W REFLEX MICROSCOPIC
Bilirubin Urine: NEGATIVE
Glucose, UA: NEGATIVE mg/dL
Hgb urine dipstick: NEGATIVE
Ketones, ur: 15 mg/dL — AB
Leukocytes, UA: NEGATIVE
Nitrite: NEGATIVE
Protein, ur: NEGATIVE mg/dL
Specific Gravity, Urine: 1.015 (ref 1.005–1.030)
Urobilinogen, UA: 0.2 mg/dL (ref 0.0–1.0)
pH: 6.5 (ref 5.0–8.0)

## 2013-12-08 LAB — COMPREHENSIVE METABOLIC PANEL WITH GFR
ALT: 20 U/L (ref 0–53)
AST: 44 U/L — ABNORMAL HIGH (ref 0–37)
Albumin: 3.9 g/dL (ref 3.5–5.2)
Alkaline Phosphatase: 110 U/L (ref 39–117)
BUN: 11 mg/dL (ref 6–23)
CO2: 20 meq/L (ref 19–32)
Calcium: 8.8 mg/dL (ref 8.4–10.5)
Chloride: 95 meq/L — ABNORMAL LOW (ref 96–112)
Creatinine, Ser: 0.64 mg/dL (ref 0.50–1.35)
GFR calc Af Amer: >90 mL/min
GFR calc non Af Amer: >90 mL/min
Glucose, Bld: 93 mg/dL (ref 70–99)
Potassium: 3.9 meq/L (ref 3.7–5.3)
Sodium: 133 meq/L — ABNORMAL LOW (ref 137–147)
Total Bilirubin: 0.3 mg/dL (ref 0.3–1.2)
Total Protein: 7.7 g/dL (ref 6.0–8.3)

## 2013-12-08 LAB — CBC WITH DIFFERENTIAL/PLATELET
Basophils Absolute: 0 10*3/uL (ref 0.0–0.1)
Basophils Relative: 0 % (ref 0–1)
Eosinophils Absolute: 0.2 10*3/uL (ref 0.0–0.7)
Eosinophils Relative: 2 % (ref 0–5)
HCT: 43.1 % (ref 39.0–52.0)
Hemoglobin: 14.9 g/dL (ref 13.0–17.0)
Lymphocytes Relative: 31 % (ref 12–46)
Lymphs Abs: 2.9 10*3/uL (ref 0.7–4.0)
MCH: 34.6 pg — ABNORMAL HIGH (ref 26.0–34.0)
MCHC: 34.6 g/dL (ref 30.0–36.0)
MCV: 100 fL (ref 78.0–100.0)
Monocytes Absolute: 1 10*3/uL (ref 0.1–1.0)
Monocytes Relative: 11 % (ref 3–12)
Neutro Abs: 5.2 10*3/uL (ref 1.7–7.7)
Neutrophils Relative %: 56 % (ref 43–77)
Platelets: 231 10*3/uL (ref 150–400)
RBC: 4.31 MIL/uL (ref 4.22–5.81)
RDW: 12.2 % (ref 11.5–15.5)
WBC: 9.3 10*3/uL (ref 4.0–10.5)

## 2013-12-08 LAB — LIPASE, BLOOD: Lipase: 21 U/L (ref 11–59)

## 2013-12-08 MED ORDER — HYDROMORPHONE HCL PF 1 MG/ML IJ SOLN
1.0000 mg | Freq: Once | INTRAMUSCULAR | Status: AC
  Administered 2013-12-08: 1 mg via INTRAVENOUS
  Filled 2013-12-08: qty 1

## 2013-12-08 NOTE — ED Notes (Signed)
Patient sitting on side of stretcher. States he needs something to drink. States that it makes it hard to breath when his throat is dry.

## 2013-12-08 NOTE — ED Notes (Signed)
Patient given discharge instruction, verbalized understand. IV removed, band aid applied. Patient ambulatory out of the department.  

## 2013-12-08 NOTE — Discharge Instructions (Signed)
Decrease the amount of alcohol you consume. Stop smoking. Followup with your primary MD to assure resolution of intra-abdominal pain. Return immediately for worsening pain, persistent vomiting, fever or for any concerns.  Abdominal Pain, Adult Many things can cause abdominal pain. Usually, abdominal pain is not caused by a disease and will improve without treatment. It can often be observed and treated at home. Your health care provider will do a physical exam and possibly order blood tests and X-rays to help determine the seriousness of your pain. However, in many cases, more time must pass before a clear cause of the pain can be found. Before that point, your health care provider may not know if you need more testing or further treatment. HOME CARE INSTRUCTIONS  Monitor your abdominal pain for any changes. The following actions may help to alleviate any discomfort you are experiencing:  Only take over-the-counter or prescription medicines as directed by your health care provider.  Do not take laxatives unless directed to do so by your health care provider.  Try a clear liquid diet (broth, tea, or water) as directed by your health care provider. Slowly move to a bland diet as tolerated. SEEK MEDICAL CARE IF:  You have unexplained abdominal pain.  You have abdominal pain associated with nausea or diarrhea.  You have pain when you urinate or have a bowel movement.  You experience abdominal pain that wakes you in the night.  You have abdominal pain that is worsened or improved by eating food.  You have abdominal pain that is worsened with eating fatty foods. SEEK IMMEDIATE MEDICAL CARE IF:   Your pain does not go away within 2 hours.  You have a fever.  You keep throwing up (vomiting).  Your pain is felt only in portions of the abdomen, such as the right side or the left lower portion of the abdomen.  You pass bloody or black tarry stools. MAKE SURE YOU:  Understand these  instructions.   Will watch your condition.   Will get help right away if you are not doing well or get worse.  Document Released: 07/25/2005 Document Revised: 08/05/2013 Document Reviewed: 06/24/2013 [facility] Patient Information 2014 [place].

## 2013-12-28 ENCOUNTER — Emergency Department (HOSPITAL_COMMUNITY)
Admission: EM | Admit: 2013-12-28 | Discharge: 2013-12-28 | Disposition: A | Discharge status: Home / Self Care | Payer: Medicaid Other | Attending: Emergency Medicine | Admitting: Emergency Medicine

## 2013-12-28 ENCOUNTER — Encounter (HOSPITAL_COMMUNITY): Payer: Self-pay | Admitting: Emergency Medicine

## 2013-12-28 DIAGNOSIS — G40909 Epilepsy, unspecified, not intractable, without status epilepticus: Secondary | ICD-10-CM | POA: Insufficient documentation

## 2013-12-28 DIAGNOSIS — F17200 Nicotine dependence, unspecified, uncomplicated: Secondary | ICD-10-CM | POA: Insufficient documentation

## 2013-12-28 DIAGNOSIS — Z8719 Personal history of other diseases of the digestive system: Secondary | ICD-10-CM | POA: Insufficient documentation

## 2013-12-28 DIAGNOSIS — Z8701 Personal history of pneumonia (recurrent): Secondary | ICD-10-CM | POA: Insufficient documentation

## 2013-12-28 DIAGNOSIS — Z79899 Other long term (current) drug therapy: Secondary | ICD-10-CM | POA: Insufficient documentation

## 2013-12-28 DIAGNOSIS — Z8679 Personal history of other diseases of the circulatory system: Secondary | ICD-10-CM | POA: Insufficient documentation

## 2013-12-28 DIAGNOSIS — IMO0002 Reserved for concepts with insufficient information to code with codable children: Secondary | ICD-10-CM | POA: Insufficient documentation

## 2013-12-28 DIAGNOSIS — Z8739 Personal history of other diseases of the musculoskeletal system and connective tissue: Secondary | ICD-10-CM | POA: Insufficient documentation

## 2013-12-28 DIAGNOSIS — Z862 Personal history of diseases of the blood and blood-forming organs and certain disorders involving the immune mechanism: Secondary | ICD-10-CM | POA: Insufficient documentation

## 2013-12-28 DIAGNOSIS — L03113 Cellulitis of right upper limb: Secondary | ICD-10-CM

## 2013-12-28 DIAGNOSIS — J4489 Other specified chronic obstructive pulmonary disease: Secondary | ICD-10-CM | POA: Insufficient documentation

## 2013-12-28 DIAGNOSIS — B029 Zoster without complications: Secondary | ICD-10-CM | POA: Insufficient documentation

## 2013-12-28 DIAGNOSIS — J449 Chronic obstructive pulmonary disease, unspecified: Secondary | ICD-10-CM | POA: Insufficient documentation

## 2013-12-28 MED ORDER — CEPHALEXIN 500 MG PO CAPS
500.0000 mg | ORAL_CAPSULE | Freq: Once | ORAL | Status: AC
  Administered 2013-12-28: 500 mg via ORAL
  Filled 2013-12-28: qty 1

## 2013-12-28 MED ORDER — SULFAMETHOXAZOLE-TMP DS 800-160 MG PO TABS
1.0000 | ORAL_TABLET | Freq: Once | ORAL | Status: AC
  Administered 2013-12-28: 1 via ORAL
  Filled 2013-12-28: qty 1

## 2013-12-28 MED ORDER — SULFAMETHOXAZOLE-TRIMETHOPRIM 800-160 MG PO TABS: 1.0000 | ORAL_TABLET | Freq: Two times a day (BID) | ORAL | Status: DC

## 2013-12-28 MED ORDER — HYDROMORPHONE HCL PF 1 MG/ML IJ SOLN
1.0000 mg | Freq: Once | INTRAMUSCULAR | Status: AC
  Administered 2013-12-28: 1 mg via INTRAMUSCULAR
  Filled 2013-12-28: qty 1

## 2013-12-28 MED ORDER — CEPHALEXIN 500 MG PO CAPS: 500.0000 mg | ORAL_CAPSULE | Freq: Four times a day (QID) | ORAL | Status: DC

## 2013-12-28 MED ORDER — ACYCLOVIR 800 MG PO TABS: 800.0000 mg | ORAL_TABLET | Freq: Every day | ORAL | Status: DC

## 2013-12-28 NOTE — Discharge Instructions (Signed)
Cellulitis Cellulitis is an infection of the skin and the tissue under the skin. The infected area is usually red and tender. This happens most often in the arms and lower legs. HOME CARE   Take your antibiotic medicine as told. Finish the medicine even if you start to feel better.  Keep the infected arm or leg raised (elevated).  Put a warm cloth on the area up to 4 times per day.  Only take medicines as told by your doctor.  Keep all doctor visits as told. GET HELP RIGHT AWAY IF:   You have a fever.  You feel very sleepy.  You throw up (vomit) or have watery poop (diarrhea).  You feel sick and have muscle aches and pains.  You see red streaks on the skin coming from the infected area.  Your red area gets bigger or turns a dark color.  Your bone or joint under the infected area is painful after the skin heals.  Your infection comes back in the same area or different area.  You have a puffy (swollen) bump in the infected area.  You have new symptoms. MAKE SURE YOU:   Understand these instructions.  Will watch your condition.  Will get help right away if you are not doing well or get worse. Document Released: 04/02/2008 Document Revised: 04/15/2012 Document Reviewed: 12/31/2011 [facility] Patient Information 2014 [place], [place].  Shingles Shingles is caused by the same virus that causes chickenpox. The first feelings may be pain or tingling. A rash will follow in a couple days. The rash may occur on any area of the body. Long-lasting pain is more likely in an elderly person. It can last months to years. There are medicines that can help prevent pain if you start taking them early. HOME CARE   Place cool cloths on the rash.  Only take medicine as told by your doctor.  You may use calamine lotion to relieve itchy skin.  Avoid touching:  Babies.  Children with inflamed skin (eczema).  People who have gotten transplanted organs.  People with chronic illnesses,  such as leukemia and AIDS.  If the rash is on the face, you may need to see a specialist. Keep all appointments. Shingles must be kept away from the eyes, if possible.  Keep all appointments.  Avoid touching the eyes or eye area, if possible. GET HELP RIGHT AWAY IF:   You have any pain on the face or eye.  Your medicines do not help.  Your redness or puffiness (swelling) spreads.  You have a fever.  You notice any red lines going away from the rash area. MAKE SURE YOU:   Understand these instructions.  Will watch your condition.  Will get help right away if you are not doing well or get worse. Document Released: 04/02/2008 Document Revised: 01/07/2012 Document Reviewed: 04/02/2008 [facility] Patient Information 2014 [place], [place].

## 2013-12-28 NOTE — ED Notes (Signed)
Rt forearm red , swollen for 3 days, Pt has been scratching his forearm and has scabbed areas present.

## 2013-12-28 NOTE — ED Notes (Signed)
Rt forearm , swollen, red, Good radial pulse, Has scabbed area to forearm.

## 2013-12-28 NOTE — ED Provider Notes (Signed)
CSN: [id number]     Arrival date & time 12/28/13  1805 History   First MD Initiated Contact with Patient 12/28/13 1823     Chief Complaint  Patient presents with  . Cellulitis     (Consider location/radiation/quality/duration/timing/severity/associated sxs/prior Treatment) HPI Comments: [name] is a 38 y.o. male who presents to the Emergency Department complaining of pain. Redness and swelling of the right forearm for 3 days.  Patient reports having itching to the forearm and after scratching his arm, the next day he noticed redness and "bumps" to his arm.  He describes a throbbing pain of his arm.  He denies fever, chills, or pain with movement of the wrist or elbow. Nothing makes the pain better or worse.   Patient is unsure if he had chickenpox as a child.    The history is provided by the patient.    Past Medical History  Diagnosis Date  . Pancreatitis   . Seizures   . Back pain   . ETOH abuse   . COPD (chronic obstructive pulmonary disease)   . SAH (subarachnoid hemorrhage)   . Macrocytosis 08/17/2011  . Bradycardia 08/17/2011  . Pneumonia    Past Surgical History  Procedure Laterality Date  . Back surgery    . Septoplasty     Family History  Problem Relation Age of Onset  . Seizures Father   . Alcohol abuse Father   . Coronary artery disease Mother     deceased age 97   History  Substance Use Topics  . Smoking status: Current Every Day Smoker -- 1.00 packs/day for 20 years    Types: Cigarettes  . Smokeless tobacco: Never Used  . Alcohol Use: 25.2 oz/week    42 Cans of beer per week    Review of Systems  Constitutional: Negative for fever, chills, activity change and appetite change.  HENT: Negative for facial swelling, sore throat and trouble swallowing.   Respiratory: Negative for chest tightness, shortness of breath and wheezing.   Gastrointestinal: Negative for nausea and vomiting.  Musculoskeletal: Negative for neck pain and neck stiffness.   Skin: Positive for color change and rash. Negative for wound.       Rash with redness to the right forearm  Neurological: Negative for dizziness, weakness, numbness and headaches.  All other systems reviewed and are negative.      Allergies  Review of patient's allergies indicates no known allergies.  Home Medications   Current Outpatient Rx  Name  Route  Sig  Dispense  Refill  . albuterol (PROVENTIL HFA;VENTOLIN HFA) 108 (90 BASE) MCG/ACT inhaler   Inhalation   Inhale 2 puffs into the lungs every 4 (four) hours as needed for wheezing or shortness of breath.   1 Inhaler   0   . albuterol (PROVENTIL) (2.5 MG/3ML) 0.083% nebulizer solution   Nebulization   Take 3 mLs (2.5 mg total) by nebulization every 4 (four) hours as needed for wheezing or shortness of breath.   30 vial   0   . carbamazepine (TEGRETOL) 200 MG tablet   Oral   Take 400 mg by mouth 2 (two) times daily.         Marland Kitchen HYDROmorphone (DILAUDID) 4 MG tablet   Oral   Take 1 tablet (4 mg total) by mouth every 6 (six) hours as needed for severe pain.   20 tablet   0   . levETIRAcetam (KEPPRA) 500 MG tablet   Oral   Take 500 mg  by mouth 2 (two) times daily.          . Multiple Vitamin (MULTIVITAMIN WITH MINERALS) TABS tablet   Oral   Take 1 tablet by mouth daily.         Marland Kitchen oxyCODONE-acetaminophen (PERCOCET) 10-325 MG per tablet   Oral   Take 1 tablet by mouth every 4 (four) hours as needed for pain.   30 tablet   0   . promethazine (PHENERGAN) 25 MG tablet   Oral   Take 1 tablet (25 mg total) by mouth every 6 (six) hours as needed for nausea or vomiting.   15 tablet   0    BP 153/79  Pulse 82  Temp(Src) 97.4 F (36.3 C) (Oral)  Resp 20  Ht 5\' 9"  (1.753 m)  Wt 120 lb (54.432 kg)  BMI 17.71 kg/m2  SpO2 97% Physical Exam  Nursing note and vitals reviewed. Constitutional: He is oriented to person, place, and time. He appears well-developed and well-nourished. No distress.  HENT:  Head:  Normocephalic and atraumatic.  Mouth/Throat: Oropharynx is clear and moist.  Neck: Normal range of motion. Neck supple.  Cardiovascular: Normal rate, regular rhythm, normal heart sounds and intact distal pulses.   No murmur heard. Pulmonary/Chest: Effort normal and breath sounds normal. No respiratory distress.  Musculoskeletal: Normal range of motion. He exhibits no edema and no tenderness.  Lymphadenopathy:    He has no cervical adenopathy.  Neurological: He is alert and oriented to person, place, and time. He exhibits normal muscle tone. Coordination normal.  Skin: Skin is warm. Rash noted. There is erythema.  Grouped, scabbed lesions of the mid right forearm with surrounding,  non-circumferential erythema.  No lymphangitis.  Pt has full ROM of the right elbow and wrist.  Radial pulse is brisk, distal sensation intact.      ED Course  Procedures (including critical care time) Labs Review Labs Reviewed - No data to display Imaging Review No results found.   EKG Interpretation None      MDM   Final diagnoses:  Shingles  Cellulitis of forearm, right      7:10 PM Patient also seen by EDP, Dr. Stevie Kern.  Care plan discussed.  Patient with probable shingles to the forearm with secondary cellulitis.  Pt is otherwise well appearing, non-toxic, VSS.  Appears stable for d/c.  Pt agrees to oral antibiotics and close f/u with his PMD in 2-3 days for recheck.  Advised to return if sx's worsen.  Pt verbalized understanding and agreed to plan.    The patient appears reasonably screened and/or stabilized for discharge and I doubt any other medical condition or other [facility] requiring further screening, evaluation, or treatment in the ED at this time prior to discharge.   Willodean Leven L. Vanessa Imlay, PA-C 12/29/13 2022

## 2013-12-31 NOTE — ED Provider Notes (Signed)
Medical screening examination/treatment/procedure(s) were performed by non-physician practitioner and as supervising physician I was immediately available for consultation/collaboration.   EKG Interpretation None       [name], MD 12/31/13 [phone]

## 2014-01-24 ENCOUNTER — Encounter (HOSPITAL_COMMUNITY): Payer: Self-pay | Admitting: Emergency Medicine

## 2014-01-24 ENCOUNTER — Emergency Department (HOSPITAL_COMMUNITY)
Admission: EM | Admit: 2014-01-24 | Discharge: 2014-01-24 | Disposition: A | Discharge status: Home / Self Care | Payer: Medicaid Other | Attending: Emergency Medicine | Admitting: Emergency Medicine

## 2014-01-24 ENCOUNTER — Emergency Department (HOSPITAL_COMMUNITY): Payer: Medicaid Other

## 2014-01-24 DIAGNOSIS — J40 Bronchitis, not specified as acute or chronic: Secondary | ICD-10-CM

## 2014-01-24 DIAGNOSIS — Z862 Personal history of diseases of the blood and blood-forming organs and certain disorders involving the immune mechanism: Secondary | ICD-10-CM | POA: Insufficient documentation

## 2014-01-24 DIAGNOSIS — Z79899 Other long term (current) drug therapy: Secondary | ICD-10-CM | POA: Insufficient documentation

## 2014-01-24 DIAGNOSIS — G40909 Epilepsy, unspecified, not intractable, without status epilepticus: Secondary | ICD-10-CM | POA: Insufficient documentation

## 2014-01-24 DIAGNOSIS — J441 Chronic obstructive pulmonary disease with (acute) exacerbation: Secondary | ICD-10-CM | POA: Insufficient documentation

## 2014-01-24 DIAGNOSIS — Z8701 Personal history of pneumonia (recurrent): Secondary | ICD-10-CM | POA: Insufficient documentation

## 2014-01-24 DIAGNOSIS — Z8679 Personal history of other diseases of the circulatory system: Secondary | ICD-10-CM | POA: Insufficient documentation

## 2014-01-24 DIAGNOSIS — Z8719 Personal history of other diseases of the digestive system: Secondary | ICD-10-CM | POA: Insufficient documentation

## 2014-01-24 DIAGNOSIS — G8929 Other chronic pain: Secondary | ICD-10-CM | POA: Insufficient documentation

## 2014-01-24 DIAGNOSIS — F17200 Nicotine dependence, unspecified, uncomplicated: Secondary | ICD-10-CM | POA: Insufficient documentation

## 2014-01-24 DIAGNOSIS — R1013 Epigastric pain: Secondary | ICD-10-CM | POA: Insufficient documentation

## 2014-01-24 DIAGNOSIS — J209 Acute bronchitis, unspecified: Secondary | ICD-10-CM | POA: Insufficient documentation

## 2014-01-24 LAB — CBC WITH DIFFERENTIAL/PLATELET
Basophils Absolute: 0 10*3/uL (ref 0.0–0.1)
Basophils Relative: 0 % (ref 0–1)
EOS PCT: 2 % (ref 0–5)
Eosinophils Absolute: 0.1 10*3/uL (ref 0.0–0.7)
HCT: 39.7 % (ref 39.0–52.0)
Hemoglobin: 13.5 g/dL (ref 13.0–17.0)
LYMPHS ABS: 1.8 10*3/uL (ref 0.7–4.0)
LYMPHS PCT: 26 % (ref 12–46)
MCH: 34.5 pg — ABNORMAL HIGH (ref 26.0–34.0)
MCHC: 34 g/dL (ref 30.0–36.0)
MCV: 101.5 fL — ABNORMAL HIGH (ref 78.0–100.0)
MONOS PCT: 12 % (ref 3–12)
Monocytes Absolute: 0.8 10*3/uL (ref 0.1–1.0)
Neutro Abs: 4 10*3/uL (ref 1.7–7.7)
Neutrophils Relative %: 59 % (ref 43–77)
Platelets: 231 10*3/uL (ref 150–400)
RBC: 3.91 MIL/uL — ABNORMAL LOW (ref 4.22–5.81)
RDW: 12.7 % (ref 11.5–15.5)
WBC: 6.7 10*3/uL (ref 4.0–10.5)

## 2014-01-24 LAB — COMPREHENSIVE METABOLIC PANEL
ALK PHOS: 129 U/L — AB (ref 39–117)
ALT: 18 U/L (ref 0–53)
AST: 28 U/L (ref 0–37)
Albumin: 3.4 g/dL — ABNORMAL LOW (ref 3.5–5.2)
BILIRUBIN TOTAL: 0.2 mg/dL — AB (ref 0.3–1.2)
BUN: 7 mg/dL (ref 6–23)
CALCIUM: 9.4 mg/dL (ref 8.4–10.5)
CO2: 25 mEq/L (ref 19–32)
Chloride: 99 mEq/L (ref 96–112)
Creatinine, Ser: 0.41 mg/dL — ABNORMAL LOW (ref 0.50–1.35)
GFR calc Af Amer: >90 mL/min (ref >90)
GFR calc non Af Amer: >90 mL/min (ref >90)
GLUCOSE: 148 mg/dL — AB (ref 70–99)
Potassium: 4.1 mEq/L (ref 3.7–5.3)
Sodium: 134 mEq/L — ABNORMAL LOW (ref 137–147)
Total Protein: 7.6 g/dL (ref 6.0–8.3)

## 2014-01-24 LAB — LIPASE, BLOOD: LIPASE: 17 U/L (ref 11–59)

## 2014-01-24 MED ORDER — ESOMEPRAZOLE MAGNESIUM 40 MG PO CPDR: 40.0000 mg | DELAYED_RELEASE_CAPSULE | Freq: Every day | ORAL | Status: DC

## 2014-01-24 MED ORDER — METHYLPREDNISOLONE SODIUM SUCC 125 MG IJ SOLR
125.0000 mg | Freq: Once | INTRAMUSCULAR | Status: AC
  Administered 2014-01-24: 125 mg via INTRAVENOUS
  Filled 2014-01-24: qty 2

## 2014-01-24 MED ORDER — IPRATROPIUM-ALBUTEROL 0.5-2.5 (3) MG/3ML IN SOLN
3.0000 mL | Freq: Once | RESPIRATORY_TRACT | Status: AC
  Administered 2014-01-24: 3 mL via RESPIRATORY_TRACT
  Filled 2014-01-24: qty 3

## 2014-01-24 MED ORDER — ONDANSETRON HCL 4 MG/2ML IJ SOLN
4.0000 mg | Freq: Once | INTRAMUSCULAR | Status: AC
  Administered 2014-01-24: 4 mg via INTRAVENOUS
  Filled 2014-01-24: qty 2

## 2014-01-24 MED ORDER — HYDROMORPHONE HCL PF 1 MG/ML IJ SOLN
1.0000 mg | Freq: Once | INTRAMUSCULAR | Status: AC
  Administered 2014-01-24: 1 mg via INTRAVENOUS
  Filled 2014-01-24: qty 1

## 2014-01-24 MED ORDER — SODIUM CHLORIDE 0.9 % IV SOLN
Freq: Once | INTRAVENOUS | Status: AC
  Administered 2014-01-24: 10:00:00 via INTRAVENOUS

## 2014-01-24 MED ORDER — PREDNISONE 20 MG PO TABS: ORAL_TABLET | ORAL | Status: DC

## 2014-01-24 MED ORDER — HYDROMORPHONE HCL 4 MG PO TABS: 4.0000 mg | ORAL_TABLET | Freq: Four times a day (QID) | ORAL | Status: DC | PRN

## 2014-01-24 MED ORDER — AZITHROMYCIN 250 MG PO TABS: ORAL_TABLET | ORAL | Status: DC

## 2014-01-24 MED ORDER — ALBUTEROL SULFATE (2.5 MG/3ML) 0.083% IN NEBU
2.5000 mg | INHALATION_SOLUTION | Freq: Once | RESPIRATORY_TRACT | Status: AC
  Administered 2014-01-24: 2.5 mg via RESPIRATORY_TRACT
  Filled 2014-01-24: qty 3

## 2014-01-24 NOTE — ED Notes (Signed)
Patient given discharge instruction, verbalized understand. IV removed, band aid applied. Patient ambulatory out of the department.  

## 2014-01-24 NOTE — ED Notes (Signed)
Productive cough and congestion x 3 days, denies fevers.  Also reports pancreatitis flare up x 3 days.  Denies n/v/d, reporting epigastric pain.

## 2014-01-24 NOTE — ED Provider Notes (Signed)
CSN: [id number]     Arrival date & time 01/24/14  4008 History   This chart was scribed for non-physician practitioner [name] [name], PA-C working with [name], MD by [name], ED Scribe. This patient was seen in room APA04/APA04 and the patient's care was started at 0907.  First MD Initiated Contact with Patient 01/24/14 [phone]     Chief Complaint  Patient presents with  . Cough      The history is provided by the patient. No language interpreter was used.   HPI Comments: [name] is a 38 y.o. male with hx of COPD, and chronic pancreatitis  who presents to the Emergency Department complaining of 2-3 days of gradual onset, gradually worsening productive cough with dark green, dark brown, and clear sputum. He reports associated fever, chills, and wheezing. Using albuterol intermittently at home w/o relief.  He also reports abdominal pain that feels similar to his baseline, chronic pancreatitis. He denies SOB, chest pain, vomiting, nausea, hematemesis, coughing up blood or any other symptoms. Pt admits to continued alcohol use. Nothing has made the symptoms better or worse.  He currently smokes 1 pack/day.   Past Medical History  Diagnosis Date  . Pancreatitis   . Seizures   . Back pain   . ETOH abuse   . COPD (chronic obstructive pulmonary disease)   . SAH (subarachnoid hemorrhage)   . Macrocytosis 08/17/2011  . Bradycardia 08/17/2011  . Pneumonia    Past Surgical History  Procedure Laterality Date  . Back surgery    . Septoplasty     Family History  Problem Relation Age of Onset  . Seizures Father   . Alcohol abuse Father   . Coronary artery disease Mother     deceased age 62   History  Substance Use Topics  . Smoking status: Current Every Day Smoker -- 1.00 packs/day for 20 years    Types: Cigarettes  . Smokeless tobacco: Never Used  . Alcohol Use: 25.2 oz/week    42 Cans of beer per week     Comment: daily    Review of Systems   Constitutional: Positive for fever and chills. Negative for fatigue.  HENT: Negative for sore throat and trouble swallowing.   Respiratory: Positive for cough and wheezing. Negative for shortness of breath.   Cardiovascular: Negative for chest pain and palpitations.  Gastrointestinal: Positive for abdominal pain. Negative for nausea, vomiting and blood in stool.  Genitourinary: Negative for dysuria, hematuria and flank pain.  Musculoskeletal: Negative for arthralgias, back pain, myalgias, neck pain and neck stiffness.  Skin: Negative for rash.  Neurological: Negative for dizziness, weakness and numbness.  Hematological: Does not bruise/bleed easily.  All other systems reviewed and are negative.      Allergies  Review of patient's allergies indicates no known allergies.  Home Medications   Current Outpatient Rx  Name  Route  Sig  Dispense  Refill  . acyclovir (ZOVIRAX) 800 MG tablet   Oral   Take 1 tablet (800 mg total) by mouth 5 (five) times daily. For 10 days   50 tablet   0   . albuterol (PROVENTIL HFA;VENTOLIN HFA) 108 (90 BASE) MCG/ACT inhaler   Inhalation   Inhale 2 puffs into the lungs every 4 (four) hours as needed for wheezing or shortness of breath.   1 Inhaler   0   . albuterol (PROVENTIL) (2.5 MG/3ML) 0.083% nebulizer solution   Nebulization   Take 3 mLs (2.5 mg total) by  nebulization every 4 (four) hours as needed for wheezing or shortness of breath.   30 vial   0   . carbamazepine (TEGRETOL) 200 MG tablet   Oral   Take 400 mg by mouth 2 (two) times daily.         . cephALEXin (KEFLEX) 500 MG capsule   Oral   Take 1 capsule (500 mg total) by mouth 4 (four) times daily. For 10 days   40 capsule   0   . HYDROmorphone (DILAUDID) 4 MG tablet   Oral   Take 1 tablet (4 mg total) by mouth every 6 (six) hours as needed for severe pain.   20 tablet   0   . levETIRAcetam (KEPPRA) 500 MG tablet   Oral   Take 500 mg by mouth 2 (two) times daily.           . Multiple Vitamin (MULTIVITAMIN WITH MINERALS) TABS tablet   Oral   Take 1 tablet by mouth daily.         Marland Kitchen oxyCODONE-acetaminophen (PERCOCET) 10-325 MG per tablet   Oral   Take 1 tablet by mouth every 4 (four) hours as needed for pain.   30 tablet   0   . promethazine (PHENERGAN) 25 MG tablet   Oral   Take 1 tablet (25 mg total) by mouth every 6 (six) hours as needed for nausea or vomiting.   15 tablet   0   . sulfamethoxazole-trimethoprim (SEPTRA DS) 800-160 MG per tablet   Oral   Take 1 tablet by mouth 2 (two) times daily. For 10 days   20 tablet   0    BP 135/91  Pulse 96  Temp(Src) 98.1 F (36.7 C) (Oral)  Resp 20  Ht 5\' 9"  (1.753 m)  Wt 120 lb (54.432 kg)  BMI 17.71 kg/m2  SpO2 96%  Physical Exam  Nursing note and vitals reviewed. Constitutional: He is oriented to person, place, and time. He appears well-developed and well-nourished. No distress.  HENT:  Head: Normocephalic and atraumatic.  Right Ear: Tympanic membrane normal.  Left Ear: Tympanic membrane normal.  Mouth/Throat: Oropharynx is clear and moist.  Neck: Normal range of motion. Neck supple.  Cardiovascular: Normal rate, regular rhythm, normal heart sounds and intact distal pulses.   No murmur heard. Pulmonary/Chest: Effort normal. No respiratory distress. He has wheezes. He has rales. He exhibits no tenderness.  Diffuse inspiratory and expiratory wheezing. Few scattered rales at the right base.   Abdominal: Soft. Bowel sounds are normal. He exhibits no distension and no mass. There is tenderness. There is no rebound and no guarding.  Tenderness in epigastric region  Musculoskeletal: Normal range of motion. He exhibits no edema and no tenderness.  Lymphadenopathy:    He has no cervical adenopathy.  Neurological: He is alert and oriented to person, place, and time. He exhibits normal muscle tone. Coordination normal.  Skin: Skin is warm and dry.    ED Course  Procedures (including  critical care time) DIAGNOSTIC STUDIES: Oxygen Saturation is 96% on RA, adequate by my interpretation.    COORDINATION OF CARE: 9:16 AM Discussed treatment plan which includes a breathing treatment and CXR with pt at bedside and pt agreed to plan. He reports he has a ride home.   Labs Review Labs Reviewed  CBC WITH DIFFERENTIAL - Abnormal; Notable for the following:    RBC 3.91 (*)    MCV 101.5 (*)    MCH 34.5 (*)  All other components within normal limits  COMPREHENSIVE METABOLIC PANEL - Abnormal; Notable for the following:    Sodium 134 (*)    Glucose, Bld 148 (*)    Creatinine, Ser 0.41 (*)    Albumin 3.4 (*)    Alkaline Phosphatase 129 (*)    Total Bilirubin 0.2 (*)    All other components within normal limits  LIPASE, BLOOD   Imaging Review Dg Chest 2 View  01/24/2014   CLINICAL DATA:  Cough.  EXAM: CHEST  2 VIEW  COMPARISON:  CT ANGIO CHEST W/CM &/OR WO/CM dated 10/26/2012; DG CHEST 2 VIEW dated 05/19/2013  FINDINGS: Hyperinflation of the lungs. Chronic densities in the upper lobes bilaterally, left greater than right, some which appear nodular. This is stable since prior chest x-ray. No acute airspace opacities. Heart is normal size. No effusions or acute bony abnormality. Stable moderate compression fracture in the mid thoracic spine.  IMPRESSION: Hyperinflation. Stable chronic nodular densities and scarring in the upper lobes. No acute findings.   Electronically Signed   By: Rolm Baptise M.D.   On: 01/24/2014 10:25     EKG Interpretation None      MDM   Final diagnoses:  Bronchitis  Abdominal pain, chronic, epigastric    Patient with hx of chronic pancreatitis and frequent ED visits for same.  Lipase is wnml and patient had CT abd/pelvis 12/14 that showed minimal, recurrent immflammatory changes of the pancreatic head.  No concerning sx's for acute abdomen on today's exam.    Pt is feeling better after medications and agrees to close f/u with his PMD in 1-2 days.   Pt strongly advised to stop drinking alcohol.  Lung sounds improved after nebs, prednisone.  XR results discussed.  VSS.  No tachycardiac , tachypnea or hypoxia   I personally performed the services described in this documentation, which was scribed in my presence. The recorded information has been reviewed and is accurate.    Francetta Ilg L. Kamarrion Stfort, PA-C 01/25/14 2256

## 2014-01-24 NOTE — Discharge Instructions (Signed)
Abdominal Pain, Adult Many things can cause belly (abdominal) pain. Most times, the belly pain is not dangerous. Many cases of belly pain can be watched and treated at home. HOME CARE   Do not take medicines that help you go poop (laxatives) unless told to by your doctor.  Only take medicine as told by your doctor.  Eat or drink as told by your doctor. Your doctor will tell you if you should be on a special diet. GET HELP IF:  You do not know what is causing your belly pain.  You have belly pain while you are sick to your stomach (nauseous) or have runny poop (diarrhea).  You have pain while you pee or poop.  Your belly pain wakes you up at night.  You have belly pain that gets worse or better when you eat.  You have belly pain that gets worse when you eat fatty foods. GET HELP RIGHT AWAY IF:   The pain does not go away within 2 hours.  You have a fever.  You keep throwing up (vomiting).  The pain changes and is only in the right or left part of the belly.  You have bloody or tarry looking poop. MAKE SURE YOU:   Understand these instructions.  Will watch your condition.  Will get help right away if you are not doing well or get worse. Document Released: 04/02/2008 Document Revised: 08/05/2013 Document Reviewed: 06/24/2013 [facility] Patient Information 2014 [place].  Bronchitis Bronchitis is swelling (inflammation) of the air tubes leading to your lungs (bronchi). This causes mucus and a cough. If the swelling gets bad, you may have trouble breathing. HOME CARE   Rest.  Drink enough fluids to keep your pee (urine) clear or pale yellow (unless you have a condition where you have to watch how much you drink).  Only take medicine as told by your doctor. If you were given antibiotic medicines, finish them even if you start to feel better.  Avoid smoke, irritating chemicals, and strong smells. These make the problem worse. Quit smoking if you smoke. This helps  your lungs heal faster.  Use a cool mist humidifier. Change the water in the humidifier every day. You can also sit in the bathroom with hot shower running for 5 10 minutes. Keep the door closed.  See your health care provider as told.  Wash your hands often. GET HELP IF: Your problems do not get better after 1 week. GET HELP RIGHT AWAY IF:   Your fever gets worse.  You have chills.  Your chest hurts.  Your problems breathing get worse.  You have blood in your mucus.  You pass out (faint).  You feel lightheaded.  You have a bad headache.  You throw up (vomit) again and again. MAKE SURE YOU:  Understand these instructions.  Will watch your condition.  Will get help right away if you are not doing well or get worse. Document Released: 04/02/2008 Document Revised: 08/05/2013 Document Reviewed: 06/09/2013 [facility] Patient Information 2014 [place], [place].

## 2014-01-28 NOTE — ED Provider Notes (Signed)
Medical screening examination/treatment/procedure(s) were performed by non-physician practitioner and as supervising physician I was immediately available for consultation/collaboration.   EKG Interpretation None       Shunta Mclaurin, MD 01/28/14 0640 

## 2014-02-13 ENCOUNTER — Encounter (HOSPITAL_COMMUNITY): Payer: Self-pay | Admitting: Emergency Medicine

## 2014-02-13 ENCOUNTER — Emergency Department (HOSPITAL_COMMUNITY)
Admission: EM | Admit: 2014-02-13 | Discharge: 2014-02-13 | Disposition: A | Discharge status: Home / Self Care | Payer: Medicaid Other | Attending: Emergency Medicine | Admitting: Emergency Medicine

## 2014-02-13 DIAGNOSIS — IMO0002 Reserved for concepts with insufficient information to code with codable children: Secondary | ICD-10-CM | POA: Insufficient documentation

## 2014-02-13 DIAGNOSIS — Z8701 Personal history of pneumonia (recurrent): Secondary | ICD-10-CM | POA: Insufficient documentation

## 2014-02-13 DIAGNOSIS — J441 Chronic obstructive pulmonary disease with (acute) exacerbation: Secondary | ICD-10-CM | POA: Insufficient documentation

## 2014-02-13 DIAGNOSIS — K861 Other chronic pancreatitis: Secondary | ICD-10-CM

## 2014-02-13 DIAGNOSIS — Z862 Personal history of diseases of the blood and blood-forming organs and certain disorders involving the immune mechanism: Secondary | ICD-10-CM | POA: Insufficient documentation

## 2014-02-13 DIAGNOSIS — F17200 Nicotine dependence, unspecified, uncomplicated: Secondary | ICD-10-CM | POA: Insufficient documentation

## 2014-02-13 DIAGNOSIS — Z9889 Other specified postprocedural states: Secondary | ICD-10-CM | POA: Insufficient documentation

## 2014-02-13 DIAGNOSIS — Z79899 Other long term (current) drug therapy: Secondary | ICD-10-CM | POA: Insufficient documentation

## 2014-02-13 LAB — CBC WITH DIFFERENTIAL/PLATELET
Basophils Absolute: 0 10*3/uL (ref 0.0–0.1)
Basophils Relative: 0 % (ref 0–1)
Eosinophils Absolute: 0.3 10*3/uL (ref 0.0–0.7)
Eosinophils Relative: 4 % (ref 0–5)
HCT: 40.4 % (ref 39.0–52.0)
Hemoglobin: 13.9 g/dL (ref 13.0–17.0)
LYMPHS ABS: 2 10*3/uL (ref 0.7–4.0)
LYMPHS PCT: 31 % (ref 12–46)
MCH: 34.6 pg — ABNORMAL HIGH (ref 26.0–34.0)
MCHC: 34.4 g/dL (ref 30.0–36.0)
MCV: 100.5 fL — AB (ref 78.0–100.0)
Monocytes Absolute: 1 10*3/uL (ref 0.1–1.0)
Monocytes Relative: 16 % — ABNORMAL HIGH (ref 3–12)
NEUTROS ABS: 3.2 10*3/uL (ref 1.7–7.7)
Neutrophils Relative %: 49 % (ref 43–77)
PLATELETS: 231 10*3/uL (ref 150–400)
RBC: 4.02 MIL/uL — AB (ref 4.22–5.81)
RDW: 12.4 % (ref 11.5–15.5)
WBC: 6.5 10*3/uL (ref 4.0–10.5)

## 2014-02-13 LAB — COMPREHENSIVE METABOLIC PANEL
ALT: 13 U/L (ref 0–53)
AST: 25 U/L (ref 0–37)
Albumin: 3.7 g/dL (ref 3.5–5.2)
Alkaline Phosphatase: 118 U/L — ABNORMAL HIGH (ref 39–117)
BILIRUBIN TOTAL: 0.2 mg/dL — AB (ref 0.3–1.2)
BUN: 5 mg/dL — ABNORMAL LOW (ref 6–23)
CALCIUM: 9 mg/dL (ref 8.4–10.5)
CHLORIDE: 96 meq/L (ref 96–112)
CO2: 25 meq/L (ref 19–32)
Creatinine, Ser: 0.38 mg/dL — ABNORMAL LOW (ref 0.50–1.35)
GLUCOSE: 94 mg/dL (ref 70–99)
Potassium: 3.9 mEq/L (ref 3.7–5.3)
Sodium: 135 mEq/L — ABNORMAL LOW (ref 137–147)
Total Protein: 7.3 g/dL (ref 6.0–8.3)

## 2014-02-13 LAB — LIPASE, BLOOD: LIPASE: 18 U/L (ref 11–59)

## 2014-02-13 MED ORDER — PROMETHAZINE HCL 25 MG PO TABS: 25.0000 mg | ORAL_TABLET | Freq: Four times a day (QID) | ORAL | Status: DC | PRN

## 2014-02-13 MED ORDER — HYDROCODONE-ACETAMINOPHEN 5-325 MG PO TABS: 1.0000 | ORAL_TABLET | ORAL | Status: DC | PRN

## 2014-02-13 MED ORDER — HYDROCODONE-ACETAMINOPHEN 5-325 MG PO TABS
2.0000 | ORAL_TABLET | Freq: Once | ORAL | Status: AC
  Administered 2014-02-13: 2 via ORAL
  Filled 2014-02-13: qty 2

## 2014-02-13 MED ORDER — PROMETHAZINE HCL 12.5 MG PO TABS
25.0000 mg | ORAL_TABLET | Freq: Once | ORAL | Status: AC
  Administered 2014-02-13: 25 mg via ORAL
  Filled 2014-02-13: qty 2

## 2014-02-13 MED ORDER — FAMOTIDINE 20 MG PO TABS
20.0000 mg | ORAL_TABLET | Freq: Once | ORAL | Status: AC
  Administered 2014-02-13: 20 mg via ORAL
  Filled 2014-02-13: qty 1

## 2014-02-13 NOTE — ED Provider Notes (Signed)
Medical screening examination/treatment/procedure(s) were performed by non-physician practitioner and as supervising physician I was immediately available for consultation/collaboration.  Richarda Blade, MD 02/13/14 [phone]

## 2014-02-13 NOTE — ED Notes (Signed)
Patient c/o upper abd pain x4 days. Denies any nausea, vomiting, diarrhea, or fevers. Per patient has pancreatitis and has been drinking alcohol. Per patient last drank alcohol last night.

## 2014-02-13 NOTE — ED Notes (Signed)
Patient with no complaints at this time. Respirations even and unlabored. Skin warm/dry. Discharge instructions reviewed with patient at this time. Patient given opportunity to voice concerns/ask questions. Patient discharged at this time and left Emergency Department with steady gait.   

## 2014-02-13 NOTE — Discharge Instructions (Signed)
Your lab tests are nonacute at this time. Please see Dr. Lorriane Shire next week for additional followup and for pain management. Please refrain from use of alcohol and tobacco products.

## 2014-02-13 NOTE — ED Provider Notes (Signed)
CSN: [id number]     Arrival date & time 02/13/14  6010 History   First MD Initiated Contact with Patient 02/13/14 [phone]     Chief Complaint  Patient presents with  . Abdominal Pain     (Consider location/radiation/quality/duration/timing/severity/associated sxs/prior Treatment) Patient is a 38 y.o. male presenting with abdominal pain. The history is provided by the patient.  Abdominal Pain Pain location:  RUQ and epigastric Pain quality: sharp   Pain severity:  Moderate Onset quality:  Gradual Duration:  4 days Timing:  Intermittent Progression:  Worsening Chronicity:  Chronic Context: alcohol use   Context: not diet changes and not recent travel   Relieved by:  Nothing Worsened by:  Nothing tried Associated symptoms: cough and shortness of breath   Associated symptoms: no chest pain, no dysuria, no fever, no hematemesis, no hematuria and no melena   Risk factors: alcohol abuse     Past Medical History  Diagnosis Date  . Pancreatitis   . Seizures   . Back pain   . ETOH abuse   . COPD (chronic obstructive pulmonary disease)   . SAH (subarachnoid hemorrhage)   . Macrocytosis 08/17/2011  . Bradycardia 08/17/2011  . Pneumonia    Past Surgical History  Procedure Laterality Date  . Back surgery    . Septoplasty     Family History  Problem Relation Age of Onset  . Seizures Father   . Alcohol abuse Father   . Coronary artery disease Mother     deceased age 67   History  Substance Use Topics  . Smoking status: Current Every Day Smoker -- 1.00 packs/day for 20 years    Types: Cigarettes  . Smokeless tobacco: Never Used  . Alcohol Use: 25.2 oz/week    42 Cans of beer per week     Comment: daily    Review of Systems  Constitutional: Negative for fever and activity change.       All ROS Neg except as noted in HPI  HENT: Negative for nosebleeds.   Eyes: Negative for photophobia and discharge.  Respiratory: Positive for cough and shortness of breath. Negative for  wheezing.   Cardiovascular: Negative for chest pain and palpitations.  Gastrointestinal: Positive for abdominal pain. Negative for blood in stool, melena and hematemesis.  Genitourinary: Negative for dysuria, frequency and hematuria.  Musculoskeletal: Negative for arthralgias, back pain and neck pain.  Skin: Negative.   Neurological: Negative for dizziness, seizures and speech difficulty.  Psychiatric/Behavioral: Negative for hallucinations and confusion.      Allergies  Review of patient's allergies indicates no known allergies.  Home Medications   Prior to Admission medications   Medication Sig Start Date End Date Taking? Authorizing Provider  albuterol (PROVENTIL) (2.5 MG/3ML) 0.083% nebulizer solution Take 3 mLs (2.5 mg total) by nebulization every 4 (four) hours as needed for wheezing or shortness of breath. 09/09/13  Yes Orpah Greek, MD  carbamazepine (TEGRETOL) 200 MG tablet Take 400 mg by mouth 2 (two) times daily.   Yes Historical Provider, MD  esomeprazole (NEXIUM) 40 MG capsule Take 1 capsule (40 mg total) by mouth daily. 01/24/14  Yes Tammy L. Triplett, PA-C  HYDROcodone-acetaminophen (NORCO) 10-325 MG per tablet Take 1 tablet by mouth every 4 (four) hours as needed for moderate pain.   Yes Historical Provider, MD  levETIRAcetam (KEPPRA) 500 MG tablet Take 500 mg by mouth 2 (two) times daily.    Yes Historical Provider, MD  Multiple Vitamin (MULTIVITAMIN WITH MINERALS) TABS tablet Take  1 tablet by mouth daily.   Yes Historical Provider, MD  azithromycin (ZITHROMAX Z-PAK) 250 MG tablet Take two tablets on day one, then one tab qd days 2-5 01/24/14   Tammy L. Triplett, PA-C  HYDROmorphone (DILAUDID) 4 MG tablet Take 1 tablet (4 mg total) by mouth every 6 (six) hours as needed for severe pain. 01/24/14   Tammy L. Triplett, PA-C  predniSONE (DELTASONE) 20 MG tablet Take 3 tablets po qd x 2 days, then 2 tablets po qd x 2 days, then 1 tablet po qd x 2 days 01/24/14   Tammy L.  Triplett, PA-C   BP 157/101  Pulse 99  Temp(Src) 98.3 F (36.8 C) (Oral)  Resp 18  Ht 5\' 9"  (1.753 m)  Wt 120 lb (54.432 kg)  BMI 17.71 kg/m2  SpO2 95% Physical Exam  Nursing note and vitals reviewed. Constitutional: He is oriented to person, place, and time. He appears well-developed and well-nourished.  Non-toxic appearance.  HENT:  Head: Normocephalic.  Right Ear: Tympanic membrane and external ear normal.  Left Ear: Tympanic membrane and external ear normal.  Eyes: EOM and lids are normal. Pupils are equal, round, and reactive to light.  Neck: Normal range of motion. Neck supple. Carotid bruit is not present.  Cardiovascular: Normal rate, regular rhythm, normal heart sounds, intact distal pulses and normal pulses.   Pulmonary/Chest: No respiratory distress. He has wheezes.  Abdominal: Soft. Bowel sounds are normal. He exhibits no ascites, no pulsatile midline mass and no mass. There is no hepatosplenomegaly. There is tenderness in the right upper quadrant and epigastric area. There is no guarding and no CVA tenderness. No hernia.  Musculoskeletal: Normal range of motion.  Lymphadenopathy:       Head (right side): No submandibular adenopathy present.       Head (left side): No submandibular adenopathy present.    He has no cervical adenopathy.  Neurological: He is alert and oriented to person, place, and time. He has normal strength. No cranial nerve deficit or sensory deficit.  Skin: Skin is warm and dry.  Psychiatric: He has a normal mood and affect. His speech is normal.    ED Course  Procedures (including critical care time) Labs Review Labs Reviewed  CBC WITH DIFFERENTIAL - Abnormal; Notable for the following:    RBC 4.02 (*)    MCV 100.5 (*)    MCH 34.6 (*)    Monocytes Relative 16 (*)    All other components within normal limits  COMPREHENSIVE METABOLIC PANEL  LIPASE, BLOOD    Imaging Review No results found.   EKG Interpretation None      MDM Patient  is to continuing to use alcohol in spite of his diagnosis of pancreatitis. He also admits to using tobacco products on a daily basis.  The temperature metabolic panel shows the sodium to be slightly low at 3.5, the creatinine low at 0.38, the BUN low at 5, alkaline phosphatase elevated at 118, total bilirubin low at 0.2. The remainder of the contents of metabolic panel is well within normal limits. The lipase is normal at 18. The complete blood count shows no acute problem. There's been no vomiting since the patient has received medication and been observed here in the emergency department. The examination is consistent with acute on chronic exacerbation of pancreatitis. I have discussed with the patient the need to refrain from all alcohol beverage use. Also discussed with the patient the need for pain management by his primary physician, and  evaluation of his pancreatitis by GI specialist. Patient will be given prescription for promethazine and Tylenol codeine.    Final diagnoses:  None    *I have reviewed nursing notes, vital signs, and all appropriate lab and imaging results for this patient.Lenox Ahr, PA-C 02/13/14 1219

## 2014-02-17 ENCOUNTER — Encounter (HOSPITAL_COMMUNITY): Payer: Self-pay | Admitting: Emergency Medicine

## 2014-02-17 ENCOUNTER — Other Ambulatory Visit: Payer: Self-pay

## 2014-02-17 ENCOUNTER — Emergency Department (HOSPITAL_COMMUNITY): Payer: Medicaid Other

## 2014-02-17 ENCOUNTER — Emergency Department (HOSPITAL_COMMUNITY)
Admission: EM | Admit: 2014-02-17 | Discharge: 2014-02-17 | Disposition: A | Discharge status: Home / Self Care | Payer: Medicaid Other | Attending: Emergency Medicine | Admitting: Emergency Medicine

## 2014-02-17 DIAGNOSIS — Z8701 Personal history of pneumonia (recurrent): Secondary | ICD-10-CM | POA: Insufficient documentation

## 2014-02-17 DIAGNOSIS — G40909 Epilepsy, unspecified, not intractable, without status epilepticus: Secondary | ICD-10-CM | POA: Insufficient documentation

## 2014-02-17 DIAGNOSIS — Z79899 Other long term (current) drug therapy: Secondary | ICD-10-CM | POA: Insufficient documentation

## 2014-02-17 DIAGNOSIS — F17200 Nicotine dependence, unspecified, uncomplicated: Secondary | ICD-10-CM | POA: Insufficient documentation

## 2014-02-17 DIAGNOSIS — Z792 Long term (current) use of antibiotics: Secondary | ICD-10-CM | POA: Insufficient documentation

## 2014-02-17 DIAGNOSIS — IMO0002 Reserved for concepts with insufficient information to code with codable children: Secondary | ICD-10-CM | POA: Insufficient documentation

## 2014-02-17 DIAGNOSIS — Z8679 Personal history of other diseases of the circulatory system: Secondary | ICD-10-CM | POA: Insufficient documentation

## 2014-02-17 DIAGNOSIS — J4489 Other specified chronic obstructive pulmonary disease: Secondary | ICD-10-CM | POA: Insufficient documentation

## 2014-02-17 DIAGNOSIS — J449 Chronic obstructive pulmonary disease, unspecified: Secondary | ICD-10-CM | POA: Insufficient documentation

## 2014-02-17 DIAGNOSIS — R072 Precordial pain: Secondary | ICD-10-CM | POA: Insufficient documentation

## 2014-02-17 DIAGNOSIS — R079 Chest pain, unspecified: Secondary | ICD-10-CM

## 2014-02-17 DIAGNOSIS — Z8719 Personal history of other diseases of the digestive system: Secondary | ICD-10-CM | POA: Insufficient documentation

## 2014-02-17 DIAGNOSIS — Z862 Personal history of diseases of the blood and blood-forming organs and certain disorders involving the immune mechanism: Secondary | ICD-10-CM | POA: Insufficient documentation

## 2014-02-17 LAB — COMPREHENSIVE METABOLIC PANEL
ALT: 11 U/L (ref 0–53)
AST: 24 U/L (ref 0–37)
Albumin: 3.4 g/dL — ABNORMAL LOW (ref 3.5–5.2)
Alkaline Phosphatase: 101 U/L (ref 39–117)
BILIRUBIN TOTAL: 0.2 mg/dL — AB (ref 0.3–1.2)
BUN: 6 mg/dL (ref 6–23)
CO2: 22 mEq/L (ref 19–32)
CREATININE: 0.38 mg/dL — AB (ref 0.50–1.35)
Calcium: 8.9 mg/dL (ref 8.4–10.5)
Chloride: 99 mEq/L (ref 96–112)
GFR calc Af Amer: >90 mL/min (ref >90)
GLUCOSE: 136 mg/dL — AB (ref 70–99)
Potassium: 4.1 mEq/L (ref 3.7–5.3)
Sodium: 134 mEq/L — ABNORMAL LOW (ref 137–147)
Total Protein: 6.8 g/dL (ref 6.0–8.3)

## 2014-02-17 LAB — CBC WITH DIFFERENTIAL/PLATELET
Basophils Absolute: 0 10*3/uL (ref 0.0–0.1)
Basophils Relative: 1 % (ref 0–1)
Eosinophils Absolute: 0.4 10*3/uL (ref 0.0–0.7)
Eosinophils Relative: 5 % (ref 0–5)
HEMATOCRIT: 40.6 % (ref 39.0–52.0)
HEMOGLOBIN: 14 g/dL (ref 13.0–17.0)
LYMPHS ABS: 2.2 10*3/uL (ref 0.7–4.0)
LYMPHS PCT: 33 % (ref 12–46)
MCH: 35.2 pg — ABNORMAL HIGH (ref 26.0–34.0)
MCHC: 34.5 g/dL (ref 30.0–36.0)
MCV: 102 fL — ABNORMAL HIGH (ref 78.0–100.0)
MONO ABS: 0.7 10*3/uL (ref 0.1–1.0)
MONOS PCT: 10 % (ref 3–12)
NEUTROS ABS: 3.3 10*3/uL (ref 1.7–7.7)
Neutrophils Relative %: 51 % (ref 43–77)
Platelets: 179 10*3/uL (ref 150–400)
RBC: 3.98 MIL/uL — AB (ref 4.22–5.81)
RDW: 12.6 % (ref 11.5–15.5)
WBC: 6.6 10*3/uL (ref 4.0–10.5)

## 2014-02-17 LAB — TROPONIN I

## 2014-02-17 LAB — LIPASE, BLOOD: LIPASE: 15 U/L (ref 11–59)

## 2014-02-17 NOTE — ED Provider Notes (Signed)
CSN: [id number]     Arrival date & time 02/17/14  0115 History   First MD Initiated Contact with Patient 02/17/14 0145     Chief Complaint  Patient presents with  . Chest Pain     (Consider location/radiation/quality/duration/timing/severity/associated sxs/prior Treatment) HPI Comments: Patient is a 38 year old male with history of seizure disorder. He presents with complaints of intermittent chest discomfort for the past 2 weeks. He states that it woke him up this morning and decided to come to be evaluated. He denies any nausea, diaphoresis, shortness of breath. He denies any exertional symptoms. His cardiac risk factors include family history and smoking cigarettes.  Patient is a 38 y.o. male presenting with chest pain. The history is provided by the patient.  Chest Pain Pain location:  Substernal area, L chest and R chest Pain quality: pressure   Pain radiates to:  Does not radiate Pain radiates to the back: no   Pain severity:  Moderate Onset quality:  Sudden Duration:  2 weeks Timing:  Intermittent Progression:  Worsening Chronicity:  New   Past Medical History  Diagnosis Date  . Pancreatitis   . Seizures   . Back pain   . ETOH abuse   . COPD (chronic obstructive pulmonary disease)   . SAH (subarachnoid hemorrhage)   . Macrocytosis 08/17/2011  . Bradycardia 08/17/2011  . Pneumonia    Past Surgical History  Procedure Laterality Date  . Back surgery    . Septoplasty     Family History  Problem Relation Age of Onset  . Seizures Father   . Alcohol abuse Father   . Coronary artery disease Mother     deceased age 41   History  Substance Use Topics  . Smoking status: Current Every Day Smoker -- 1.00 packs/day for 20 years    Types: Cigarettes  . Smokeless tobacco: Never Used  . Alcohol Use: 25.2 oz/week    42 Cans of beer per week     Comment: daily    Review of Systems  Cardiovascular: Positive for chest pain.  All other systems reviewed and are  negative.     Allergies  Review of patient's allergies indicates no known allergies.  Home Medications   Prior to Admission medications   Medication Sig Start Date End Date Taking? Authorizing Provider  albuterol (PROVENTIL) (2.5 MG/3ML) 0.083% nebulizer solution Take 3 mLs (2.5 mg total) by nebulization every 4 (four) hours as needed for wheezing or shortness of breath. 09/09/13  Yes Orpah Greek, MD  azithromycin (ZITHROMAX Z-PAK) 250 MG tablet Take two tablets on day one, then one tab qd days 2-5 01/24/14  Yes Tammy L. Triplett, PA-C  carbamazepine (TEGRETOL) 200 MG tablet Take 400 mg by mouth 2 (two) times daily.   Yes Historical Provider, MD  esomeprazole (NEXIUM) 40 MG capsule Take 1 capsule (40 mg total) by mouth daily. 01/24/14  Yes Tammy L. Triplett, PA-C  HYDROcodone-acetaminophen (NORCO) 10-325 MG per tablet Take 1 tablet by mouth every 4 (four) hours as needed for moderate pain.   Yes Historical Provider, MD  HYDROcodone-acetaminophen (NORCO/VICODIN) 5-325 MG per tablet Take 1 tablet by mouth every 4 (four) hours as needed for moderate pain. 02/13/14  Yes Lenox Ahr, PA-C  HYDROmorphone (DILAUDID) 4 MG tablet Take 1 tablet (4 mg total) by mouth every 6 (six) hours as needed for severe pain. 01/24/14  Yes Tammy L. Triplett, PA-C  levETIRAcetam (KEPPRA) 500 MG tablet Take 500 mg by mouth 2 (two) times daily.  Yes Historical Provider, MD  Multiple Vitamin (MULTIVITAMIN WITH MINERALS) TABS tablet Take 1 tablet by mouth daily.   Yes Historical Provider, MD  predniSONE (DELTASONE) 20 MG tablet Take 3 tablets po qd x 2 days, then 2 tablets po qd x 2 days, then 1 tablet po qd x 2 days 01/24/14  Yes Tammy L. Triplett, PA-C  promethazine (PHENERGAN) 25 MG tablet Take 1 tablet (25 mg total) by mouth every 6 (six) hours as needed for nausea or vomiting. 02/13/14  Yes Lenox Ahr, PA-C   BP 165/85  Pulse 67  Temp(Src) 97.6 F (36.4 C) (Oral)  Resp 13  Ht 5\' 9"  (1.753 m)   Wt 120 lb (54.432 kg)  BMI 17.71 kg/m2  SpO2 98% Physical Exam  Nursing note and vitals reviewed. Constitutional: He is oriented to person, place, and time. He appears well-developed and well-nourished. No distress.  HENT:  Head: Normocephalic and atraumatic.  Mouth/Throat: Oropharynx is clear and moist.  Neck: Normal range of motion. Neck supple.  Cardiovascular: Normal rate, regular rhythm and normal heart sounds.   No murmur heard. Pulmonary/Chest: Effort normal and breath sounds normal. No respiratory distress. He has no wheezes.  Abdominal: Soft. Bowel sounds are normal. He exhibits no distension. There is no tenderness.  Musculoskeletal: Normal range of motion. He exhibits no edema.  Neurological: He is alert and oriented to person, place, and time.  Skin: Skin is warm and dry. He is not diaphoretic.    ED Course  Procedures (including critical care time) Labs Review Labs Reviewed  CBC WITH DIFFERENTIAL - Abnormal; Notable for the following:    RBC 3.98 (*)    MCV 102.0 (*)    MCH 35.2 (*)    All other components within normal limits  COMPREHENSIVE METABOLIC PANEL - Abnormal; Notable for the following:    Sodium 134 (*)    Glucose, Bld 136 (*)    Creatinine, Ser 0.38 (*)    Albumin 3.4 (*)    Total Bilirubin 0.2 (*)    All other components within normal limits  TROPONIN I  LIPASE, BLOOD    Imaging Review Dg Chest 2 View  02/17/2014   CLINICAL DATA:  Chest pain  EXAM: CHEST  2 VIEW  COMPARISON:  DG CHEST 2 VIEW dated 01/24/2014  FINDINGS: Hyperinflation suggesting emphysema. Scarring in the lung apices. No focal airspace disease or consolidation. Normal heart size and pulmonary vascularity. Old right rib fractures. Compression of a mid thoracic vertebra. No change since prior study.  IMPRESSION: No active cardiopulmonary disease.   Electronically Signed   By: Lucienne Capers M.D.   On: 02/17/2014 02:46     Date: 02/17/2014  Rate: 76  Rhythm: normal sinus rhythm   QRS Axis: normal  Intervals: normal  ST/T Wave abnormalities: normal  Conduction Disutrbances:none  Narrative Interpretation:   Old EKG Reviewed: unchanged    MDM   Final diagnoses:  None    Patient is a 38 year old male who presents for evaluation of chest and upper abdominal discomfort. By the time he arrived to the ER his symptoms were improving, and by the time I saw him his symptoms have completely resolved. He is actually requesting to go home prior workup being initiated. I was able to convince him to stay for blood work. This revealed a negative troponin and his EKG is unremarkable. I doubt his symptoms are cardiac in nature as he has no exertional component and his symptoms are somewhat atypical.  He reports  a history of pancreatitis and states that he consumes alcohol regularly. His lipase is normal today, however I suspect there may be a GI component to his discomfort, possibly gastritis. At this point I feel as though he is appropriate for discharge. I will recommend Prilosec twice daily and when necessary return. He should followup with a primary Dr. in the next several days.    Veryl Speak, MD 02/17/14 [phone]

## 2014-02-17 NOTE — ED Notes (Signed)
Discharge instructions reviewed with pt, questions answered. Pt verbalized understanding.  

## 2014-02-17 NOTE — Discharge Instructions (Signed)
Prilosec 20 mg twice daily for the next 2 weeks.  Return to the emergency department if your symptoms substantially worsen or change, and be certain to followup with your primary doctor within the next several days for a recheck.   Chest Pain (Nonspecific) It is often hard to give a specific diagnosis for the cause of chest pain. There is always a chance that your pain could be related to something serious, such as a heart attack or a blood clot in the lungs. You need to follow up with your caregiver for further evaluation. CAUSES   Heartburn.  Pneumonia or bronchitis.  Anxiety or stress.  Inflammation around your heart (pericarditis) or lung (pleuritis or pleurisy).  A blood clot in the lung.  A collapsed lung (pneumothorax). It can develop suddenly on its own (spontaneous pneumothorax) or from injury (trauma) to the chest.  Shingles infection (herpes zoster virus). The chest wall is composed of bones, muscles, and cartilage. Any of these can be the source of the pain.  The bones can be bruised by injury.  The muscles or cartilage can be strained by coughing or overwork.  The cartilage can be affected by inflammation and become sore (costochondritis). DIAGNOSIS  Lab tests or other studies, such as X-rays, electrocardiography, stress testing, or cardiac imaging, may be needed to find the cause of your pain.  TREATMENT   Treatment depends on what may be causing your chest pain. Treatment may include:  Acid blockers for heartburn.  Anti-inflammatory medicine.  Pain medicine for inflammatory conditions.  Antibiotics if an infection is present.  You may be advised to change lifestyle habits. This includes stopping smoking and avoiding alcohol, caffeine, and chocolate.  You may be advised to keep your head raised (elevated) when sleeping. This reduces the chance of acid going backward from your stomach into your esophagus.  Most of the time, nonspecific chest pain will  improve within 2 to 3 days with rest and mild pain medicine. HOME CARE INSTRUCTIONS   If antibiotics were prescribed, take your antibiotics as directed. Finish them even if you start to feel better.  For the next few days, avoid physical activities that bring on chest pain. Continue physical activities as directed.  Do not smoke.  Avoid drinking alcohol.  Only take over-the-counter or prescription medicine for pain, discomfort, or fever as directed by your caregiver.  Follow your caregiver's suggestions for further testing if your chest pain does not go away.  Keep any follow-up appointments you made. If you do not go to an appointment, you could develop lasting (chronic) problems with pain. If there is any problem keeping an appointment, you must call to reschedule. SEEK MEDICAL CARE IF:   You think you are having problems from the medicine you are taking. Read your medicine instructions carefully.  Your chest pain does not go away, even after treatment.  You develop a rash with blisters on your chest. SEEK IMMEDIATE MEDICAL CARE IF:   You have increased chest pain or pain that spreads to your arm, neck, jaw, back, or abdomen.  You develop shortness of breath, an increasing cough, or you are coughing up blood.  You have severe back or abdominal pain, feel nauseous, or vomit.  You develop severe weakness, fainting, or chills.  You have a fever. THIS IS AN EMERGENCY. Do not wait to see if the pain will go away. Get medical help at once. Call your local emergency services (911 in [place] Do not drive yourself to the hospital.  MAKE SURE YOU:   Understand these instructions.  Will watch your condition.  Will get help right away if you are not doing well or get worse. Document Released: 07/25/2005 Document Revised: 01/07/2012 Document Reviewed: 05/20/2008 [facility] Patient Information 2014 [place].

## 2014-02-17 NOTE — ED Notes (Signed)
Pt states awoke from sleep with squeezing chest pain "under both sides of my chest". Pt states he went to bed with indigestion/heart burn.

## 2014-02-28 ENCOUNTER — Emergency Department (HOSPITAL_COMMUNITY)
Admission: EM | Admit: 2014-02-28 | Discharge: 2014-02-28 | Disposition: A | Discharge status: Home / Self Care | Payer: Medicaid Other

## 2014-02-28 NOTE — ED Notes (Signed)
Called for pt x 1.  

## 2014-02-28 NOTE — ED Notes (Signed)
Called pt x 2

## 2014-02-28 NOTE — ED Notes (Signed)
Pt called x 3, not in waiting room.

## 2014-03-03 ENCOUNTER — Encounter (HOSPITAL_COMMUNITY): Payer: Self-pay | Admitting: Emergency Medicine

## 2014-03-03 ENCOUNTER — Emergency Department (HOSPITAL_COMMUNITY): Payer: Medicaid Other

## 2014-03-03 ENCOUNTER — Emergency Department (HOSPITAL_COMMUNITY)
Admission: EM | Admit: 2014-03-03 | Discharge: 2014-03-03 | Disposition: A | Discharge status: Home / Self Care | Payer: Medicaid Other | Attending: Emergency Medicine | Admitting: Emergency Medicine

## 2014-03-03 DIAGNOSIS — F1010 Alcohol abuse, uncomplicated: Secondary | ICD-10-CM | POA: Insufficient documentation

## 2014-03-03 DIAGNOSIS — R0789 Other chest pain: Secondary | ICD-10-CM | POA: Insufficient documentation

## 2014-03-03 DIAGNOSIS — Z862 Personal history of diseases of the blood and blood-forming organs and certain disorders involving the immune mechanism: Secondary | ICD-10-CM | POA: Insufficient documentation

## 2014-03-03 DIAGNOSIS — Z8701 Personal history of pneumonia (recurrent): Secondary | ICD-10-CM | POA: Insufficient documentation

## 2014-03-03 DIAGNOSIS — G40909 Epilepsy, unspecified, not intractable, without status epilepticus: Secondary | ICD-10-CM | POA: Insufficient documentation

## 2014-03-03 DIAGNOSIS — Z79899 Other long term (current) drug therapy: Secondary | ICD-10-CM | POA: Insufficient documentation

## 2014-03-03 DIAGNOSIS — F17200 Nicotine dependence, unspecified, uncomplicated: Secondary | ICD-10-CM | POA: Insufficient documentation

## 2014-03-03 DIAGNOSIS — J449 Chronic obstructive pulmonary disease, unspecified: Secondary | ICD-10-CM | POA: Insufficient documentation

## 2014-03-03 DIAGNOSIS — J4489 Other specified chronic obstructive pulmonary disease: Secondary | ICD-10-CM | POA: Insufficient documentation

## 2014-03-03 DIAGNOSIS — Z91199 Patient's noncompliance with other medical treatment and regimen due to unspecified reason: Secondary | ICD-10-CM | POA: Insufficient documentation

## 2014-03-03 DIAGNOSIS — Z9119 Patient's noncompliance with other medical treatment and regimen: Secondary | ICD-10-CM | POA: Insufficient documentation

## 2014-03-03 DIAGNOSIS — R079 Chest pain, unspecified: Secondary | ICD-10-CM

## 2014-03-03 DIAGNOSIS — Z8719 Personal history of other diseases of the digestive system: Secondary | ICD-10-CM | POA: Insufficient documentation

## 2014-03-03 DIAGNOSIS — IMO0002 Reserved for concepts with insufficient information to code with codable children: Secondary | ICD-10-CM | POA: Insufficient documentation

## 2014-03-03 LAB — CBC WITH DIFFERENTIAL/PLATELET
BASOS PCT: 0 % (ref 0–1)
Basophils Absolute: 0 10*3/uL (ref 0.0–0.1)
EOS ABS: 0.3 10*3/uL (ref 0.0–0.7)
Eosinophils Relative: 3 % (ref 0–5)
HEMATOCRIT: 39.9 % (ref 39.0–52.0)
Hemoglobin: 14 g/dL (ref 13.0–17.0)
Lymphocytes Relative: 39 % (ref 12–46)
Lymphs Abs: 3.6 10*3/uL (ref 0.7–4.0)
MCH: 34.7 pg — AB (ref 26.0–34.0)
MCHC: 35.1 g/dL (ref 30.0–36.0)
MCV: 99 fL (ref 78.0–100.0)
MONO ABS: 0.9 10*3/uL (ref 0.1–1.0)
Monocytes Relative: 10 % (ref 3–12)
NEUTROS PCT: 48 % (ref 43–77)
Neutro Abs: 4.4 10*3/uL (ref 1.7–7.7)
Platelets: 343 10*3/uL (ref 150–400)
RBC: 4.03 MIL/uL — ABNORMAL LOW (ref 4.22–5.81)
RDW: 12.2 % (ref 11.5–15.5)
WBC: 9.2 10*3/uL (ref 4.0–10.5)

## 2014-03-03 LAB — COMPREHENSIVE METABOLIC PANEL
ALBUMIN: 3.6 g/dL (ref 3.5–5.2)
ALT: 11 U/L (ref 0–53)
AST: 34 U/L (ref 0–37)
Alkaline Phosphatase: 131 U/L — ABNORMAL HIGH (ref 39–117)
BUN: 5 mg/dL — AB (ref 6–23)
CO2: 22 mEq/L (ref 19–32)
CREATININE: 0.58 mg/dL (ref 0.50–1.35)
Calcium: 9.1 mg/dL (ref 8.4–10.5)
Chloride: 94 mEq/L — ABNORMAL LOW (ref 96–112)
GFR calc Af Amer: >90 mL/min (ref >90)
GFR calc non Af Amer: >90 mL/min (ref >90)
Glucose, Bld: 120 mg/dL — ABNORMAL HIGH (ref 70–99)
Potassium: 4.6 mEq/L (ref 3.7–5.3)
Sodium: 132 mEq/L — ABNORMAL LOW (ref 137–147)
TOTAL PROTEIN: 7.8 g/dL (ref 6.0–8.3)

## 2014-03-03 LAB — TROPONIN I

## 2014-03-03 LAB — LIPASE, BLOOD: LIPASE: 13 U/L (ref 11–59)

## 2014-03-03 MED ORDER — PANTOPRAZOLE SODIUM 20 MG PO TBEC: 20.0000 mg | DELAYED_RELEASE_TABLET | Freq: Every day | ORAL | Status: DC

## 2014-03-03 MED ORDER — SUCRALFATE 1 G PO TABS: 1.0000 g | ORAL_TABLET | Freq: Four times a day (QID) | ORAL | Status: DC

## 2014-03-03 MED ORDER — PANTOPRAZOLE SODIUM 20 MG PO TBEC
20.0000 mg | DELAYED_RELEASE_TABLET | Freq: Once | ORAL | Status: AC
  Administered 2014-03-03: 20 mg via ORAL
  Filled 2014-03-03: qty 1

## 2014-03-03 MED ORDER — GI COCKTAIL ~~LOC~~
30.0000 mL | Freq: Once | ORAL | Status: AC
  Administered 2014-03-03: 30 mL via ORAL
  Filled 2014-03-03: qty 30

## 2014-03-03 MED ORDER — KETOROLAC TROMETHAMINE 60 MG/2ML IM SOLN
30.0000 mg | Freq: Once | INTRAMUSCULAR | Status: AC
  Administered 2014-03-03: 30 mg via INTRAMUSCULAR
  Filled 2014-03-03: qty 2

## 2014-03-03 NOTE — ED Notes (Signed)
The pt has a history of pancreatitis.  He takes percocet 10mg  for that and the percocet is not helping his chest pain

## 2014-03-03 NOTE — ED Provider Notes (Signed)
CSN: [id number]     Arrival date & time 03/03/14  1948 History   First MD Initiated Contact with Patient 03/03/14 2225     Chief Complaint  Patient presents with  . Chest Pain     Patient is a 38 y.o. male presenting with chest pain.  Chest Pain Associated symptoms: not vomiting     Patient presents with ongoing chest pain.  Patient has had pain for at least 2 weeks. Patient has had pain on the prior episodes similar to this. During this episode he has been evaluated at other physician's offices, emergency department, and 2 days ago was in the waiting room at one of our affiliated facilities and left due to a prolonged wait. The pain is burning/sharp, sternal, nonradiating He has not taken any prescribed medication for his pain. He has no pleuritic or exertional Sx. He has no n/v/d, f/c. He continues to smoke and drink.   Past Medical History  Diagnosis Date  . Pancreatitis   . Seizures   . Back pain   . ETOH abuse   . COPD (chronic obstructive pulmonary disease)   . SAH (subarachnoid hemorrhage)   . Macrocytosis 08/17/2011  . Bradycardia 08/17/2011  . Pneumonia    Past Surgical History  Procedure Laterality Date  . Back surgery    . Septoplasty     Family History  Problem Relation Age of Onset  . Seizures Father   . Alcohol abuse Father   . Coronary artery disease Mother     deceased age 41   History  Substance Use Topics  . Smoking status: Current Every Day Smoker -- 1.00 packs/day for 20 years    Types: Cigarettes  . Smokeless tobacco: Never Used  . Alcohol Use: 25.2 oz/week    42 Cans of beer per week     Comment: daily    Review of Systems  Constitutional:       Per HPI, otherwise negative  HENT:       Per HPI, otherwise negative  Respiratory:       Per HPI, otherwise negative  Cardiovascular: Positive for chest pain.       Per HPI, otherwise negative  Gastrointestinal: Negative for vomiting.  Endocrine:       Negative aside from HPI   Genitourinary:       Neg aside from HPI   Musculoskeletal:       Per HPI, otherwise negative  Skin: Negative.   Neurological: Negative for syncope.      Allergies  Review of patient's allergies indicates no known allergies.  Home Medications   Prior to Admission medications   Medication Sig Start Date End Date Taking? Authorizing Provider  albuterol (PROVENTIL) (2.5 MG/3ML) 0.083% nebulizer solution Take 3 mLs (2.5 mg total) by nebulization every 4 (four) hours as needed for wheezing or shortness of breath. 09/09/13  Yes Orpah Greek, MD  carbamazepine (TEGRETOL) 200 MG tablet Take 400 mg by mouth 2 (two) times daily.   Yes Historical Provider, MD  Fluticasone-Salmeterol (ADVAIR) 250-50 MCG/DOSE AEPB Inhale 1 puff into the lungs 2 (two) times daily.   Yes Historical Provider, MD  ibuprofen (ADVIL,MOTRIN) 200 MG tablet Take 400 mg by mouth every 6 (six) hours as needed.   Yes Historical Provider, MD  levETIRAcetam (KEPPRA) 500 MG tablet Take 500 mg by mouth 2 (two) times daily.    Yes Historical Provider, MD  Multiple Vitamin (MULTIVITAMIN WITH MINERALS) TABS tablet Take 1 tablet by mouth daily.  Yes Historical Provider, MD  oxyCODONE-acetaminophen (PERCOCET) 10-325 MG per tablet Take 1 tablet by mouth every 4 (four) hours as needed for pain.   Yes Historical Provider, MD   BP 148/83  Temp(Src) 98.3 F (36.8 C) (Oral)  Resp 10  Ht 5\' 9"  (1.753 m)  Wt 120 lb (54.432 kg)  BMI 17.71 kg/m2  SpO2 95% Physical Exam  Nursing note and vitals reviewed. Constitutional: He is oriented to person, place, and time. He appears well-developed. No distress.  HENT:  Head: Normocephalic and atraumatic.  Eyes: Conjunctivae and EOM are normal.  Cardiovascular: Normal rate and regular rhythm.   Pulmonary/Chest: Effort normal. No stridor. No respiratory distress.  Abdominal: He exhibits no distension.  Musculoskeletal: He exhibits no edema.  Neurological: He is alert and oriented to  person, place, and time.  Skin: Skin is warm and dry.  Psychiatric: He has a normal mood and affect.    ED Course  Procedures (including critical care time) Labs Review Labs Reviewed  CBC WITH DIFFERENTIAL - Abnormal; Notable for the following:    RBC 4.03 (*)    MCH 34.7 (*)    All other components within normal limits  COMPREHENSIVE METABOLIC PANEL - Abnormal; Notable for the following:    Sodium 132 (*)    Chloride 94 (*)    Glucose, Bld 120 (*)    BUN 5 (*)    Alkaline Phosphatase 131 (*)    Total Bilirubin <0.2 (*)    All other components within normal limits  LIPASE, BLOOD  TROPONIN I    Imaging Review Dg Chest 2 View  03/03/2014   CLINICAL DATA:  Chest pain, weakness  EXAM: CHEST  2 VIEW  COMPARISON:  DG CHEST 2 VIEW dated 02/17/2014; DG CHEST 2 VIEW dated 11/02/2012  FINDINGS: The lungs are hyperinflated likely secondary to COPD. There is left apical scarring. There is no focal parenchymal opacity, pleural effusion, or pneumothorax. The heart and mediastinal contours are unremarkable.  There is a chronic lower thoracic spine vertebral body compression fracture unchanged compared with prior exams.  IMPRESSION: No active cardiopulmonary disease.   Electronically Signed   By: Kathreen Devoid   On: 03/03/2014 20:51     EKG Interpretation   Date/Time:  Wednesday Mar 03 2014 19:55:04 EDT Ventricular Rate:  85 PR Interval:  160 QRS Duration: 92 QT Interval:  364 QTC Calculation: 433 R Axis:   85 Text Interpretation:  Normal sinus rhythm with sinus arrhythmia Normal ECG  Sinus rhythm No significant change since last tracing Sinus arrhythmia  Borderline ECG Left ventricular hypertrophy Confirmed by Carmin Muskrat   MD [phone]) on 03/03/2014 8:01:01 PM     Chart review demonstrates that the patient has been seen recently for similar pain, has had unremarkable evaluation for multiple prior occasions. As documented alcohol abuse, and medication noncompliance.  MDM  The patient  presents with weeks of ongoing pain.  On exam he is awake alert, hemodynamically stable, in no distress. Patient's EKG, labs are reassuring, and there is low suspicion for atypical ACS. At all extremities with patient and family members about minimizing alcohol use, taking antacids as previously directed, and following up with a primary care physician.     Carmin Muskrat, MD 03/03/14 2252

## 2014-03-03 NOTE — ED Notes (Signed)
Pt st's pain wakes him up at night.  St's he has had this pain x's 2 weeks or more.

## 2014-03-03 NOTE — Discharge Instructions (Signed)
As discussed, your evaluation today has been largely reassuring.  But, it is important that you monitor your condition carefully, and do not hesitate to return to the ED if you develop new, or concerning changes in your condition.  Your pain is likely due to irritation of the esophagus and/or stomach.  Please minimize alcohol use and stop smoking when possible.  Please follow-up with your physician for appropriate ongoing care.

## 2014-03-03 NOTE — ED Notes (Signed)
The pt is c/o rt and lt chest pain for a week and one half.  No sob nausea dizziness. Intermittent cough.  The pain is especially at night

## 2014-04-02 NOTE — Telephone Encounter (Signed)
Entered in error

## 2014-05-12 ENCOUNTER — Encounter (HOSPITAL_COMMUNITY): Payer: Self-pay | Admitting: Emergency Medicine

## 2014-05-12 ENCOUNTER — Emergency Department (HOSPITAL_COMMUNITY)
Admission: EM | Admit: 2014-05-12 | Discharge: 2014-05-12 | Disposition: A | Discharge status: Home / Self Care | Payer: Medicaid Other | Attending: Emergency Medicine | Admitting: Emergency Medicine

## 2014-05-12 DIAGNOSIS — R7402 Elevation of levels of lactic acid dehydrogenase (LDH): Secondary | ICD-10-CM | POA: Insufficient documentation

## 2014-05-12 DIAGNOSIS — J449 Chronic obstructive pulmonary disease, unspecified: Secondary | ICD-10-CM | POA: Insufficient documentation

## 2014-05-12 DIAGNOSIS — Z8719 Personal history of other diseases of the digestive system: Secondary | ICD-10-CM | POA: Insufficient documentation

## 2014-05-12 DIAGNOSIS — R1013 Epigastric pain: Secondary | ICD-10-CM | POA: Diagnosis present

## 2014-05-12 DIAGNOSIS — E871 Hypo-osmolality and hyponatremia: Secondary | ICD-10-CM | POA: Diagnosis not present

## 2014-05-12 DIAGNOSIS — G40909 Epilepsy, unspecified, not intractable, without status epilepticus: Secondary | ICD-10-CM | POA: Diagnosis not present

## 2014-05-12 DIAGNOSIS — IMO0002 Reserved for concepts with insufficient information to code with codable children: Secondary | ICD-10-CM | POA: Diagnosis not present

## 2014-05-12 DIAGNOSIS — Z79899 Other long term (current) drug therapy: Secondary | ICD-10-CM | POA: Diagnosis not present

## 2014-05-12 DIAGNOSIS — Z8701 Personal history of pneumonia (recurrent): Secondary | ICD-10-CM | POA: Insufficient documentation

## 2014-05-12 DIAGNOSIS — Z862 Personal history of diseases of the blood and blood-forming organs and certain disorders involving the immune mechanism: Secondary | ICD-10-CM | POA: Insufficient documentation

## 2014-05-12 DIAGNOSIS — F17200 Nicotine dependence, unspecified, uncomplicated: Secondary | ICD-10-CM | POA: Diagnosis not present

## 2014-05-12 DIAGNOSIS — R740 Nonspecific elevation of levels of transaminase and lactic acid dehydrogenase [LDH]: Secondary | ICD-10-CM

## 2014-05-12 DIAGNOSIS — Z8679 Personal history of other diseases of the circulatory system: Secondary | ICD-10-CM | POA: Diagnosis not present

## 2014-05-12 DIAGNOSIS — R7401 Elevation of levels of liver transaminase levels: Secondary | ICD-10-CM | POA: Insufficient documentation

## 2014-05-12 DIAGNOSIS — J4489 Other specified chronic obstructive pulmonary disease: Secondary | ICD-10-CM | POA: Insufficient documentation

## 2014-05-12 LAB — CBC WITH DIFFERENTIAL/PLATELET
Basophils Absolute: 0 10*3/uL (ref 0.0–0.1)
Basophils Relative: 0 % (ref 0–1)
Eosinophils Absolute: 0.1 10*3/uL (ref 0.0–0.7)
Eosinophils Relative: 2 % (ref 0–5)
HCT: 45 % (ref 39.0–52.0)
Hemoglobin: 15.9 g/dL (ref 13.0–17.0)
LYMPHS ABS: 1.9 10*3/uL (ref 0.7–4.0)
LYMPHS PCT: 35 % (ref 12–46)
MCH: 35.1 pg — ABNORMAL HIGH (ref 26.0–34.0)
MCHC: 35.3 g/dL (ref 30.0–36.0)
MCV: 99.3 fL (ref 78.0–100.0)
MONO ABS: 0.8 10*3/uL (ref 0.1–1.0)
MONOS PCT: 15 % — AB (ref 3–12)
NEUTROS ABS: 2.6 10*3/uL (ref 1.7–7.7)
NEUTROS PCT: 48 % (ref 43–77)
Platelets: 260 10*3/uL (ref 150–400)
RBC: 4.53 MIL/uL (ref 4.22–5.81)
RDW: 12.5 % (ref 11.5–15.5)
WBC: 5.4 10*3/uL (ref 4.0–10.5)

## 2014-05-12 LAB — COMPREHENSIVE METABOLIC PANEL
ALK PHOS: 113 U/L (ref 39–117)
ALT: 23 U/L (ref 0–53)
AST: 45 U/L — AB (ref 0–37)
Albumin: 4.3 g/dL (ref 3.5–5.2)
Anion gap: 15 (ref 5–15)
BUN: 13 mg/dL (ref 6–23)
CALCIUM: 9.2 mg/dL (ref 8.4–10.5)
CO2: 23 meq/L (ref 19–32)
Chloride: 95 mEq/L — ABNORMAL LOW (ref 96–112)
Creatinine, Ser: 0.5 mg/dL (ref 0.50–1.35)
GFR calc Af Amer: >90 mL/min (ref >90)
Glucose, Bld: 91 mg/dL (ref 70–99)
Potassium: 4.3 mEq/L (ref 3.7–5.3)
SODIUM: 133 meq/L — AB (ref 137–147)
Total Bilirubin: 0.6 mg/dL (ref 0.3–1.2)
Total Protein: 7.8 g/dL (ref 6.0–8.3)

## 2014-05-12 LAB — LIPASE, BLOOD: Lipase: 15 U/L (ref 11–59)

## 2014-05-12 MED ORDER — ONDANSETRON HCL 4 MG/2ML IJ SOLN
4.0000 mg | Freq: Once | INTRAMUSCULAR | Status: AC
  Administered 2014-05-12: 4 mg via INTRAVENOUS
  Filled 2014-05-12: qty 2

## 2014-05-12 MED ORDER — HYDROMORPHONE HCL PF 1 MG/ML IJ SOLN
1.0000 mg | Freq: Once | INTRAMUSCULAR | Status: AC
  Administered 2014-05-12: 1 mg via INTRAVENOUS
  Filled 2014-05-12: qty 1

## 2014-05-12 MED ORDER — SODIUM CHLORIDE 0.9 % IV SOLN
1000.0000 mL | Freq: Once | INTRAVENOUS | Status: AC
  Administered 2014-05-12: 1000 mL via INTRAVENOUS

## 2014-05-12 MED ORDER — HYDROMORPHONE HCL 2 MG PO TABS: 2.0000 mg | ORAL_TABLET | ORAL | Status: DC | PRN

## 2014-05-12 MED ORDER — SODIUM CHLORIDE 0.9 % IV SOLN
1000.0000 mL | INTRAVENOUS | Status: DC
  Administered 2014-05-12: 1000 mL via INTRAVENOUS

## 2014-05-12 NOTE — Discharge Instructions (Signed)
Do not drink any alcohol at all unless you want to have your abdominal pain come back.   Abdominal Pain Many things can cause abdominal pain. Usually, abdominal pain is not caused by a disease and will improve without treatment. It can often be observed and treated at home. Your health care provider will do a physical exam and possibly order blood tests and X-rays to help determine the seriousness of your pain. However, in many cases, more time must pass before a clear cause of the pain can be found. Before that point, your health care provider may not know if you need more testing or further treatment. HOME CARE INSTRUCTIONS  Monitor your abdominal pain for any changes. The following actions may help to alleviate any discomfort you are experiencing:  Only take over-the-counter or prescription medicines as directed by your health care provider.  Do not take laxatives unless directed to do so by your health care provider.  Try a clear liquid diet (broth, tea, or water) as directed by your health care provider. Slowly move to a bland diet as tolerated. SEEK MEDICAL CARE IF:  You have unexplained abdominal pain.  You have abdominal pain associated with nausea or diarrhea.  You have pain when you urinate or have a bowel movement.  You experience abdominal pain that wakes you in the night.  You have abdominal pain that is worsened or improved by eating food.  You have abdominal pain that is worsened with eating fatty foods.  You have a fever. SEEK IMMEDIATE MEDICAL CARE IF:   Your pain does not go away within 2 hours.  You keep throwing up (vomiting).  Your pain is felt only in portions of the abdomen, such as the right side or the left lower portion of the abdomen.  You pass bloody or black tarry stools. MAKE SURE YOU:  Understand these instructions.   Will watch your condition.   Will get help right away if you are not doing well or get worse.  Document Released:  07/25/2005 Document Revised: 10/20/2013 Document Reviewed: 06/24/2013 [facility] Patient Information 2015 [place], [place]. This information is not intended to replace advice given to you by your health care provider. Make sure you discuss any questions you have with your health care provider.  Hydromorphone tablets What is this medicine? HYDROMORPHONE (hye droe MOR fone) is a pain reliever. It is used to treat moderate to severe pain. This medicine may be used for other purposes; ask your health care provider or pharmacist if you have questions. COMMON BRAND NAME(S): Dilaudid What should I tell my health care provider before I take this medicine? They need to know if you have any of these conditions: -brain tumor -drug abuse or addiction -head injury -heart disease -frequently drink alcohol containing drinks -kidney disease or problems going to the bathroom -liver disease -lung disease, asthma, or breathing problems -mental problems -an allergic or unusual reaction to lactose, hydromorphone, other opioid analgesics, other medicines, sulfites, foods, dyes, or preservatives -pregnant or trying to get pregnant -breast-feeding How should I use this medicine? Take this medicine by mouth with a glass of water. If the medicine upsets your stomach, take it with food or milk. Follow the directions on the prescription label. Do not take more medicine than you are told to take. Talk to your pediatrician regarding the use of this medicine in children. Special care may be needed. Overdosage: If you think you have taken too much of this medicine contact a poison control center or  emergency room at once. NOTE: This medicine is only for you. Do not share this medicine with others. What if I miss a dose? If you miss a dose, take it as soon as you can. If it is almost time for your next dose, take only that dose. Do not take double or extra doses. What may interact with this  medicine? -alcohol -antihistamines for allergy, cough and cold -medicines for anesthesia -medicines for depression, anxiety, or psychotic disturbances -medicines for sleep -muscle relaxants -naltrexone -narcotic medicines (opiates) for pain -phenothiazines like chlorpromazine, mesoridazine, prochlorperazine, thioridazine -tramadol This list may not describe all possible interactions. Give your health care provider a list of all the medicines, herbs, non-prescription drugs, or dietary supplements you use. Also tell them if you smoke, drink alcohol, or use illegal drugs. Some items may interact with your medicine. What should I watch for while using this medicine? Tell your doctor or health care professional if your pain does not go away, if it gets worse, or if you have new or a different type of pain. You may develop tolerance to the medicine. Tolerance means that you will need a higher dose of the medicine for pain relief. Tolerance is normal and is expected if you take this medicine for a long time. Do not suddenly stop taking your medicine because you may develop a severe reaction. Your body becomes used to the medicine. This does NOT mean you are addicted. Addiction is a behavior related to getting and using a drug for a non-medical reason. If you have pain, you have a medical reason to take pain medicine. Your doctor will tell you how much medicine to take. If your doctor wants you to stop the medicine, the dose will be slowly lowered over time to avoid any side effects. You may get drowsy or dizzy. Do not drive, use machinery, or do anything that needs mental alertness until you know how this medicine affects you. Do not stand or sit up quickly, especially if you are an older patient. This reduces the risk of dizzy or fainting spells. Alcohol may interfere with the effect of this medicine. Avoid alcoholic drinks. There are different types of narcotic medicines (opiates) for pain. If you take  more than one type at the same time, you may have more side effects. Give your health care provider a list of all medicines you use. Your doctor will tell you how much medicine to take. Do not take more medicine than directed. Call emergency for help if you have problems breathing. This medicine will cause constipation. Try to have a bowel movement at least every 2 to 3 days. If you do not have a bowel movement for 3 days, call your doctor or health care professional. Your mouth may get dry. Chewing sugarless gum or sucking hard candy, and drinking plenty of water may help. Contact your doctor if the problem does not go away or is severe. What side effects may I notice from receiving this medicine? Side effects that you should report to your doctor or health care professional as soon as possible: -allergic reactions like skin rash, itching or hives, swelling of the face, lips, or tongue -breathing problems -changes in vision -confusion -feeling faint or lightheaded, falls -seizures -slow or fast heartbeat -trouble passing urine or change in the amount of urine -trouble with balance, talking, walking -unusually weak or tired Side effects that usually do not require medical attention (report to your doctor or health care professional if they continue or  are bothersome): -difficulty sleeping -drowsiness -dry mouth -flushing -headache -itching -loss of appetite -nausea, vomiting This list may not describe all possible side effects. Call your doctor for medical advice about side effects. You may report side effects to FDA at 1-800-FDA-1088. Where should I keep my medicine? Keep out of the reach of children. This medicine can be abused. Keep your medicine in a safe place to protect it from theft. Do not share this medicine with anyone. Selling or giving away this medicine is dangerous and against the law. Store at room temperature between 15 and 30 degrees C (59 and 86 degrees F). Keep container  tightly closed. Protect from light. This medicine may cause accidental overdose and death if it is taken by other adults, children, or pets. Flush any unused medicine down the toilet to reduce the chance of harm. Do not use the medicine after the expiration date. NOTE: This sheet is a summary. It may not cover all possible information. If you have questions about this medicine, talk to your doctor, pharmacist, or health care provider.  2015, [employer]. (2013-05-19 09:50:15)

## 2014-05-12 NOTE — ED Notes (Signed)
Pt has pancreatitis, this episode, pain started yesterday, took percocet 10 mg 5 hours ago, did not touch pain, pt can not sleep.

## 2014-05-12 NOTE — ED Notes (Signed)
Patient given discharge instruction, verbalized understand. IV removed, band aid applied. Patient ambulatory out of the department.  

## 2014-05-12 NOTE — ED Provider Notes (Signed)
CSN: [id number]     Arrival date & time 05/12/14  0250 History   First MD Initiated Contact with Patient 05/12/14 0303     Chief Complaint  Patient presents with  . Abdominal Pain     (Consider location/radiation/quality/duration/timing/severity/associated sxs/prior Treatment) Patient is a 38 y.o. male presenting with abdominal pain. The history is provided by the patient.  Abdominal Pain He has a history of pancreatitis and states it has been having epigastric pain which has been getting worse over the last 36 hours pain is sharp and nonradiating. There is no associated nausea or vomiting or diarrhea. Nothing makes it better nothing makes it worse. He did take a dose of oxycodone-acetaminophen with no relief. He does admit to drinking an unknown amount of beer prior to onset of pain.  Past Medical History  Diagnosis Date  . Pancreatitis   . Seizures   . Back pain   . ETOH abuse   . COPD (chronic obstructive pulmonary disease)   . SAH (subarachnoid hemorrhage)   . Macrocytosis 08/17/2011  . Bradycardia 08/17/2011  . Pneumonia    Past Surgical History  Procedure Laterality Date  . Back surgery    . Septoplasty     Family History  Problem Relation Age of Onset  . Seizures Father   . Alcohol abuse Father   . Coronary artery disease Mother     deceased age 54   History  Substance Use Topics  . Smoking status: Current Every Day Smoker -- 1.00 packs/day for 20 years    Types: Cigarettes  . Smokeless tobacco: Never Used  . Alcohol Use: 25.2 oz/week    42 Cans of beer per week     Comment: daily    Review of Systems  Gastrointestinal: Positive for abdominal pain.  All other systems reviewed and are negative.     Allergies  Review of patient's allergies indicates no known allergies.  Home Medications   Prior to Admission medications   Medication Sig Start Date End Date Taking? Authorizing Provider  albuterol (PROVENTIL) (2.5 MG/3ML) 0.083% nebulizer solution Take  3 mLs (2.5 mg total) by nebulization every 4 (four) hours as needed for wheezing or shortness of breath. 09/09/13  Yes Orpah Greek, MD  carbamazepine (TEGRETOL) 200 MG tablet Take 400 mg by mouth 2 (two) times daily.   Yes Historical Provider, MD  Fluticasone-Salmeterol (ADVAIR) 250-50 MCG/DOSE AEPB Inhale 1 puff into the lungs 2 (two) times daily.   Yes Historical Provider, MD  ibuprofen (ADVIL,MOTRIN) 200 MG tablet Take 400 mg by mouth every 6 (six) hours as needed.   Yes Historical Provider, MD  levETIRAcetam (KEPPRA) 500 MG tablet Take 500 mg by mouth 2 (two) times daily.    Yes Historical Provider, MD  Multiple Vitamin (MULTIVITAMIN WITH MINERALS) TABS tablet Take 1 tablet by mouth daily.   Yes Historical Provider, MD  pantoprazole (PROTONIX) 20 MG tablet Take 1 tablet (20 mg total) by mouth daily. 03/03/14   Carmin Muskrat, MD  sucralfate (CARAFATE) 1 G tablet Take 1 tablet (1 g total) by mouth 4 (four) times daily. 03/03/14   Carmin Muskrat, MD   BP 139/96  Pulse 78  Temp(Src) 97.6 F (36.4 C) (Oral)  Resp 20  Ht 5\' 9"  (1.753 m)  Wt 120 lb (54.432 kg)  BMI 17.71 kg/m2  SpO2 99% Physical Exam  Nursing note and vitals reviewed.  38 year old male, resting comfortably and in no acute distress. Vital signs are significant for mild  hypertension with blood pressure 139/96. Oxygen saturation is 99%, which is normal. Head is normocephalic and atraumatic. PERRLA, EOMI. Oropharynx is clear. Neck is nontender and supple without adenopathy or JVD. Back is nontender and there is no CVA tenderness. Lungs are clear without rales, wheezes, or rhonchi. Chest is nontender. Heart has regular rate and rhythm without murmur. Abdomen is soft, flat, with moderate epigastric tenderness. There is no rebound or guarding. There are no masses or hepatosplenomegaly and peristalsis is hypoactive. Extremities have no cyanosis or edema, full range of motion is present. Skin is warm and dry without  rash. Neurologic: Mental status is normal, cranial nerves are intact, there are no motor or sensory deficits.  ED Course  Procedures (including critical care time) Labs Review Results for orders placed during the hospital encounter of 05/12/14  CBC WITH DIFFERENTIAL      Result Value Ref Range   WBC 5.4  4.0 - 10.5 K/uL   RBC 4.53  4.22 - 5.81 MIL/uL   Hemoglobin 15.9  13.0 - 17.0 g/dL   HCT 45.0  39.0 - 52.0 %   MCV 99.3  78.0 - 100.0 fL   MCH 35.1 (*) 26.0 - 34.0 pg   MCHC 35.3  30.0 - 36.0 g/dL   RDW 12.5  11.5 - 15.5 %   Platelets 260  150 - 400 K/uL   Neutrophils Relative % 48  43 - 77 %   Neutro Abs 2.6  1.7 - 7.7 K/uL   Lymphocytes Relative 35  12 - 46 %   Lymphs Abs 1.9  0.7 - 4.0 K/uL   Monocytes Relative 15 (*) 3 - 12 %   Monocytes Absolute 0.8  0.1 - 1.0 K/uL   Eosinophils Relative 2  0 - 5 %   Eosinophils Absolute 0.1  0.0 - 0.7 K/uL   Basophils Relative 0  0 - 1 %   Basophils Absolute 0.0  0.0 - 0.1 K/uL  COMPREHENSIVE METABOLIC PANEL      Result Value Ref Range   Sodium 133 (*) 137 - 147 mEq/L   Potassium 4.3  3.7 - 5.3 mEq/L   Chloride 95 (*) 96 - 112 mEq/L   CO2 23  19 - 32 mEq/L   Glucose, Bld 91  70 - 99 mg/dL   BUN 13  6 - 23 mg/dL   Creatinine, Ser 0.50  0.50 - 1.35 mg/dL   Calcium 9.2  8.4 - 10.5 mg/dL   Total Protein 7.8  6.0 - 8.3 g/dL   Albumin 4.3  3.5 - 5.2 g/dL   AST 45 (*) 0 - 37 U/L   ALT 23  0 - 53 U/L   Alkaline Phosphatase 113  39 - 117 U/L   Total Bilirubin 0.6  0.3 - 1.2 mg/dL   GFR calc non Af Amer >90  >90 mL/min   GFR calc Af Amer >90  >90 mL/min   Anion gap 15  5 - 15  LIPASE, BLOOD      Result Value Ref Range   Lipase 15  11 - 59 U/L   MDM   Final diagnoses:  Epigastric pain  Hyponatremia  Elevated transaminase level    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 Old records are reviewed and there is a prior ED visits for pancreatitis as well as hospitalizations for same. He will be given IV fluids  and IV hydromorphone and screening labs obtained.  Patient achieved reasonable pain relief with 2 injections of hydromorphone.  Laboratory workup shows mild hyponatremia and mild elevation of AST-probably not clinically significant. Lipase is not elevated, and, in fact, is low. This does make it unlikely that this is truly an exacerbation of pancreatitis and probably represents alcoholic gastritis. His record on [place] controlled substance reporting system was reviewed and he receives 120 tablets of oxycodone-acetaminophen a monthly with last prescription being picked up on June 18. He will be given a prescription for 10 hydromorphone tablets and is to follow up with his PCP in the next 2 days. He actually would be due for a new prescription of is oxycodone-acetaminophen.  Delora Fuel, MD 09/73/53 2992

## 2014-07-03 ENCOUNTER — Encounter (HOSPITAL_COMMUNITY): Payer: Self-pay | Admitting: Emergency Medicine

## 2014-07-03 ENCOUNTER — Emergency Department (HOSPITAL_COMMUNITY)
Admission: EM | Admit: 2014-07-03 | Discharge: 2014-07-03 | Disposition: A | Discharge status: Home / Self Care | Payer: Medicaid Other | Attending: Emergency Medicine | Admitting: Emergency Medicine

## 2014-07-03 ENCOUNTER — Emergency Department (HOSPITAL_COMMUNITY): Payer: Medicaid Other

## 2014-07-03 DIAGNOSIS — X58XXXA Exposure to other specified factors, initial encounter: Secondary | ICD-10-CM | POA: Insufficient documentation

## 2014-07-03 DIAGNOSIS — Z8701 Personal history of pneumonia (recurrent): Secondary | ICD-10-CM | POA: Insufficient documentation

## 2014-07-03 DIAGNOSIS — F17200 Nicotine dependence, unspecified, uncomplicated: Secondary | ICD-10-CM | POA: Diagnosis not present

## 2014-07-03 DIAGNOSIS — Z79899 Other long term (current) drug therapy: Secondary | ICD-10-CM | POA: Diagnosis not present

## 2014-07-03 DIAGNOSIS — S99919A Unspecified injury of unspecified ankle, initial encounter: Principal | ICD-10-CM

## 2014-07-03 DIAGNOSIS — J449 Chronic obstructive pulmonary disease, unspecified: Secondary | ICD-10-CM | POA: Insufficient documentation

## 2014-07-03 DIAGNOSIS — M79604 Pain in right leg: Secondary | ICD-10-CM

## 2014-07-03 DIAGNOSIS — Z8679 Personal history of other diseases of the circulatory system: Secondary | ICD-10-CM | POA: Insufficient documentation

## 2014-07-03 DIAGNOSIS — Y9339 Activity, other involving climbing, rappelling and jumping off: Secondary | ICD-10-CM | POA: Diagnosis not present

## 2014-07-03 DIAGNOSIS — Z862 Personal history of diseases of the blood and blood-forming organs and certain disorders involving the immune mechanism: Secondary | ICD-10-CM | POA: Diagnosis not present

## 2014-07-03 DIAGNOSIS — G40909 Epilepsy, unspecified, not intractable, without status epilepticus: Secondary | ICD-10-CM | POA: Insufficient documentation

## 2014-07-03 DIAGNOSIS — S99929A Unspecified injury of unspecified foot, initial encounter: Principal | ICD-10-CM

## 2014-07-03 DIAGNOSIS — S8990XA Unspecified injury of unspecified lower leg, initial encounter: Secondary | ICD-10-CM | POA: Insufficient documentation

## 2014-07-03 DIAGNOSIS — Y929 Unspecified place or not applicable: Secondary | ICD-10-CM | POA: Insufficient documentation

## 2014-07-03 DIAGNOSIS — J4489 Other specified chronic obstructive pulmonary disease: Secondary | ICD-10-CM | POA: Insufficient documentation

## 2014-07-03 MED ORDER — KETOROLAC TROMETHAMINE 60 MG/2ML IM SOLN
60.0000 mg | Freq: Once | INTRAMUSCULAR | Status: AC
  Administered 2014-07-03: 60 mg via INTRAMUSCULAR
  Filled 2014-07-03: qty 2

## 2014-07-03 MED ORDER — IBUPROFEN 800 MG PO TABS: 800.0000 mg | ORAL_TABLET | Freq: Three times a day (TID) | ORAL | Status: DC

## 2014-07-03 NOTE — ED Notes (Signed)
Jumped over fence today and came down on right leg.  C/o lower right leg pain.

## 2014-07-03 NOTE — ED Provider Notes (Signed)
CSN: [id number]     Arrival date & time 07/03/14  1317 History   First MD Initiated Contact with Patient 07/03/14 1342     Chief Complaint  Patient presents with  . Leg Injury   (Consider location/radiation/quality/duration/timing/severity/associated sxs/prior Treatment) HPI [name] is a 38 yo male with PMH: COPD, SZ, Pancreatitis, presenting to the ED with c/o rt lateral lower extremity pain after jumping from fence appr 8 feet off the ground, 3 hours ago.  He states he landed directly on both feet, with inverting or everting either one but immediately felt pain in his right calf.  He describes the pain as sharp/ stabbing pain and rates it as 10/10.  He reports difficulty walking since the injury because of inability to bear weight.  He denies numbness, tingling or pain in his knee, ankle, or hip.  Past Medical History  Diagnosis Date  . Pancreatitis   . Seizures   . Back pain   . ETOH abuse   . COPD (chronic obstructive pulmonary disease)   . SAH (subarachnoid hemorrhage)   . Macrocytosis 08/17/2011  . Bradycardia 08/17/2011  . Pneumonia    Past Surgical History  Procedure Laterality Date  . Back surgery    . Septoplasty     Family History  Problem Relation Age of Onset  . Seizures Father   . Alcohol abuse Father   . Coronary artery disease Mother     deceased age 24   History  Substance Use Topics  . Smoking status: Current Every Day Smoker -- 1.00 packs/day for 20 years    Types: Cigarettes  . Smokeless tobacco: Never Used  . Alcohol Use: 25.2 oz/week    42 Cans of beer per week     Comment: daily    Review of Systems  Musculoskeletal: Positive for myalgias. Negative for joint swelling.  Neurological: Negative for numbness.      Allergies  Review of patient's allergies indicates no known allergies.  Home Medications   Prior to Admission medications   Medication Sig Start Date End Date Taking? Authorizing Provider  albuterol (PROVENTIL) (2.5  MG/3ML) 0.083% nebulizer solution Take 3 mLs (2.5 mg total) by nebulization every 4 (four) hours as needed for wheezing or shortness of breath. 09/09/13   Orpah Greek, MD  carbamazepine (TEGRETOL) 200 MG tablet Take 400 mg by mouth 2 (two) times daily.    Historical Provider, MD  Fluticasone-Salmeterol (ADVAIR) 250-50 MCG/DOSE AEPB Inhale 1 puff into the lungs 2 (two) times daily.    Historical Provider, MD  HYDROmorphone (DILAUDID) 2 MG tablet Take 1 tablet (2 mg total) by mouth every 4 (four) hours as needed for severe pain. 10/30/56   Delora Fuel, MD  ibuprofen (ADVIL,MOTRIN) 200 MG tablet Take 400 mg by mouth every 6 (six) hours as needed.    Historical Provider, MD  levETIRAcetam (KEPPRA) 500 MG tablet Take 500 mg by mouth 2 (two) times daily.     Historical Provider, MD  Multiple Vitamin (MULTIVITAMIN WITH MINERALS) TABS tablet Take 1 tablet by mouth daily.    Historical Provider, MD  pantoprazole (PROTONIX) 20 MG tablet Take 1 tablet (20 mg total) by mouth daily. 03/03/14   Carmin Muskrat, MD  sucralfate (CARAFATE) 1 G tablet Take 1 tablet (1 g total) by mouth 4 (four) times daily. 03/03/14   Carmin Muskrat, MD   BP 142/96  Pulse 95  Temp(Src) 98.4 F (36.9 C) (Oral)  Resp 20  Ht 5\' 9"  (1.753 m)  Wt 120 lb (54.432 kg)  BMI 17.71 kg/m2  SpO2 100% Physical Exam  Nursing note and vitals reviewed. Constitutional: He appears well-developed and well-nourished. No distress.  HENT:  Head: Normocephalic and atraumatic.  Eyes: Conjunctivae are normal. Right eye exhibits no discharge. Left eye exhibits no discharge. No scleral icterus.  Cardiovascular: Intact distal pulses.   Pulmonary/Chest: Effort normal.  Musculoskeletal: He exhibits no edema.       Right lower leg: He exhibits tenderness and swelling. He exhibits no bony tenderness and no deformity.  Neurological: He is alert. No sensory deficit.  Skin: Skin is warm and dry. He is not diaphoretic.    ED Course  Procedures  (including critical care time) Labs Review Labs Reviewed - No data to display  Imaging Review Dg Tibia/fibula Right  07/03/2014   CLINICAL DATA:  Fall  EXAM: RIGHT TIBIA AND FIBULA - 2 VIEW  COMPARISON:  None.  FINDINGS: No fracture.  No dislocation.  Vascular calcifications.  IMPRESSION: No acute bony injury.   Electronically Signed   By: Maryclare Bean M.D.   On: 07/03/2014 13:58     EKG Interpretation None      MDM   Final diagnoses:  Pain of right lower extremity   Pt seems very tender to palpation of rt calf, his X-Ray is negative for obvious fracture or dislocation. Pain managed in ED. He is advised to follow up tomorrow in the ED with Dr. Sabra Heck for a re-check.  Discharge instructions include prescription for ibuprofen and referral for orthopedics if symptoms persist. Patient given brace while in ED, RICE therapy recommended and discussed. Patient will be dc home & is agreeable with above plan.  Return precautions provided  Filed Vitals:   07/03/14 1320  BP: 142/96  Pulse: 95  Temp: 98.4 F (36.9 C)  TempSrc: Oral  Resp: 20  Height: 5\' 9"  (1.753 m)  Weight: 120 lb (54.432 kg)  SpO2: 100%   Meds given in ED:  Medications  ketorolac (TORADOL) injection 60 mg (60 mg Intramuscular Given 07/03/14 1440)    Discharge Medication List as of 07/03/2014  2:43 PM    START taking these medications   Details  ibuprofen (ADVIL,MOTRIN) 800 MG tablet Take 1 tablet (800 mg total) by mouth 3 (three) times daily., Starting 07/03/2014, Until Discontinued, Print       Britt Bottom, NP 07/03/14 [place], NP 07/03/14 [phone]

## 2014-07-03 NOTE — Discharge Instructions (Signed)
Please follow directions provided.  Be sure to come back to the emergency department tomorrow between the hours of 7 am and 3 pm for re-check with Dr. Sabra Heck.    SEEK MEDICAL ATTENTION: your toes are numb or tingling, appear gray or blue, your calf becomes very hard or you have worsening pain.

## 2014-07-03 NOTE — ED Provider Notes (Signed)
38 year old male presents after jumping off an 8 foot fence landing on his legs. He developed acute onset of pain in the right lower extremity in the upper calf which has been persistent and is now worse with palpation and associated with mild swelling. He has normal pulses at the feet bilaterally, normal sensation of the feet bilaterally, mild to moderate swelling of the proximal calf on the right, compartment is soft, bruising is present in an early stage, minimal pain with passive range of motion of the gastrocnemius. The patient is able to straight leg raise, able to flex and extend at the ankle. Imaging reviewed, I have personally seen the x-rays and found to be no fractures of the tibia or fibula, radiologist is in agreement. The patient already takes Percocet 10 mg tablets at home, he was informed that he will not get any stronger medication than that at this time. Toradol, knee immobilizer, rice therapy, I have asked the patient to come back tomorrow so that I can recheck his leg to evaluate for worsening swelling or possible compartment syndrome though at this time it does not appear that he has a compartment syndrome. The patient expresses understanding and will return tomorrow.  Likely partial tear of gastrocnemius   Medical screening examination/treatment/procedure(s) were conducted as a shared visit with non-physician practitioner(s) and myself.  I personally evaluated the patient during the encounter.  Clinical Impression: RLE pain        Johnna Acosta, MD 07/04/14 [phone]

## 2014-07-04 NOTE — ED Provider Notes (Signed)
Medical screening examination/treatment/procedure(s) were conducted as a shared visit with non-physician practitioner(s) and myself.  I personally evaluated the patient during the encounter  Please see my separate respective documentation pertaining to this patient encounter   Johnna Acosta, MD 07/04/14 [phone]

## 2014-12-26 ENCOUNTER — Emergency Department (HOSPITAL_COMMUNITY): Payer: Medicaid Other

## 2014-12-26 ENCOUNTER — Inpatient Hospital Stay (HOSPITAL_COMMUNITY)
Admission: EM | Admit: 2014-12-26 | Discharge: 2015-01-01 | DRG: 439 | Discharge status: Against Medical Advice | Payer: Medicaid Other | Attending: Family Medicine | Admitting: Family Medicine

## 2014-12-26 ENCOUNTER — Encounter (HOSPITAL_COMMUNITY): Payer: Self-pay | Admitting: Emergency Medicine

## 2014-12-26 DIAGNOSIS — K863 Pseudocyst of pancreas: Secondary | ICD-10-CM

## 2014-12-26 DIAGNOSIS — E871 Hypo-osmolality and hyponatremia: Secondary | ICD-10-CM

## 2014-12-26 DIAGNOSIS — Z811 Family history of alcohol abuse and dependence: Secondary | ICD-10-CM

## 2014-12-26 DIAGNOSIS — Z79891 Long term (current) use of opiate analgesic: Secondary | ICD-10-CM

## 2014-12-26 DIAGNOSIS — J449 Chronic obstructive pulmonary disease, unspecified: Secondary | ICD-10-CM | POA: Diagnosis present

## 2014-12-26 DIAGNOSIS — Z7951 Long term (current) use of inhaled steroids: Secondary | ICD-10-CM

## 2014-12-26 DIAGNOSIS — R1013 Epigastric pain: Secondary | ICD-10-CM

## 2014-12-26 DIAGNOSIS — K859 Acute pancreatitis without necrosis or infection, unspecified: Secondary | ICD-10-CM

## 2014-12-26 DIAGNOSIS — R569 Unspecified convulsions: Secondary | ICD-10-CM | POA: Diagnosis present

## 2014-12-26 DIAGNOSIS — K219 Gastro-esophageal reflux disease without esophagitis: Secondary | ICD-10-CM | POA: Diagnosis present

## 2014-12-26 DIAGNOSIS — Z8249 Family history of ischemic heart disease and other diseases of the circulatory system: Secondary | ICD-10-CM

## 2014-12-26 DIAGNOSIS — K852 Alcohol induced acute pancreatitis without necrosis or infection: Secondary | ICD-10-CM | POA: Insufficient documentation

## 2014-12-26 DIAGNOSIS — E778 Other disorders of glycoprotein metabolism: Secondary | ICD-10-CM | POA: Diagnosis present

## 2014-12-26 DIAGNOSIS — R109 Unspecified abdominal pain: Secondary | ICD-10-CM

## 2014-12-26 DIAGNOSIS — F1020 Alcohol dependence, uncomplicated: Secondary | ICD-10-CM | POA: Diagnosis present

## 2014-12-26 DIAGNOSIS — F17210 Nicotine dependence, cigarettes, uncomplicated: Secondary | ICD-10-CM | POA: Diagnosis present

## 2014-12-26 DIAGNOSIS — K860 Alcohol-induced chronic pancreatitis: Secondary | ICD-10-CM | POA: Insufficient documentation

## 2014-12-26 HISTORY — DX: Gastro-esophageal reflux disease without esophagitis: K21.9

## 2014-12-26 LAB — CBC WITH DIFFERENTIAL/PLATELET
BASOS ABS: 0 10*3/uL (ref 0.0–0.1)
BASOS PCT: 0 % (ref 0–1)
EOS PCT: 1 % (ref 0–5)
Eosinophils Absolute: 0.2 10*3/uL (ref 0.0–0.7)
HEMATOCRIT: 42.6 % (ref 39.0–52.0)
Hemoglobin: 14.9 g/dL (ref 13.0–17.0)
Lymphocytes Relative: 20 % (ref 12–46)
Lymphs Abs: 2.6 10*3/uL (ref 0.7–4.0)
MCH: 34.7 pg — ABNORMAL HIGH (ref 26.0–34.0)
MCHC: 35 g/dL (ref 30.0–36.0)
MCV: 99.3 fL (ref 78.0–100.0)
MONO ABS: 1.1 10*3/uL — AB (ref 0.1–1.0)
Monocytes Relative: 9 % (ref 3–12)
NEUTROS ABS: 9.1 10*3/uL — AB (ref 1.7–7.7)
NEUTROS PCT: 70 % (ref 43–77)
Platelets: 279 10*3/uL (ref 150–400)
RBC: 4.29 MIL/uL (ref 4.22–5.81)
RDW: 12.1 % (ref 11.5–15.5)
WBC: 13 10*3/uL — AB (ref 4.0–10.5)

## 2014-12-26 LAB — COMPREHENSIVE METABOLIC PANEL
ALT: 40 U/L (ref 0–53)
ANION GAP: 12 (ref 5–15)
AST: 59 U/L — AB (ref 0–37)
Albumin: 4.5 g/dL (ref 3.5–5.2)
Alkaline Phosphatase: 128 U/L — ABNORMAL HIGH (ref 39–117)
BUN: 5 mg/dL — ABNORMAL LOW (ref 6–23)
CO2: 22 mmol/L (ref 19–32)
Calcium: 8.4 mg/dL (ref 8.4–10.5)
Chloride: 88 mmol/L — ABNORMAL LOW (ref 96–112)
Creatinine, Ser: 0.35 mg/dL — ABNORMAL LOW (ref 0.50–1.35)
GFR calc Af Amer: >90 mL/min (ref >90)
GFR calc non Af Amer: >90 mL/min (ref >90)
Glucose, Bld: 93 mg/dL (ref 70–99)
POTASSIUM: 3.9 mmol/L (ref 3.5–5.1)
Sodium: 122 mmol/L — ABNORMAL LOW (ref 135–145)
TOTAL PROTEIN: 8.2 g/dL (ref 6.0–8.3)
Total Bilirubin: 0.5 mg/dL (ref 0.3–1.2)

## 2014-12-26 LAB — LIPASE, BLOOD: Lipase: 40 U/L (ref 11–59)

## 2014-12-26 LAB — ETHANOL: Alcohol, Ethyl (B): 73 mg/dL — ABNORMAL HIGH (ref 0–9)

## 2014-12-26 MED ORDER — IOHEXOL 300 MG/ML  SOLN
100.0000 mL | Freq: Once | INTRAMUSCULAR | Status: AC | PRN
  Administered 2014-12-26: 100 mL via INTRAVENOUS

## 2014-12-26 MED ORDER — ALBUTEROL SULFATE (2.5 MG/3ML) 0.083% IN NEBU
2.5000 mg | INHALATION_SOLUTION | RESPIRATORY_TRACT | Status: DC | PRN
  Administered 2014-12-27: 2.5 mg via RESPIRATORY_TRACT
  Filled 2014-12-26: qty 3

## 2014-12-26 MED ORDER — SODIUM CHLORIDE 0.9 % IV SOLN: INTRAVENOUS | Status: AC

## 2014-12-26 MED ORDER — NICOTINE 21 MG/24HR TD PT24
21.0000 mg | MEDICATED_PATCH | Freq: Every day | TRANSDERMAL | Status: DC
  Administered 2014-12-27 – 2014-12-31 (×5): 21 mg via TRANSDERMAL
  Filled 2014-12-26 (×6): qty 1

## 2014-12-26 MED ORDER — LEVETIRACETAM 500 MG PO TABS
500.0000 mg | ORAL_TABLET | Freq: Two times a day (BID) | ORAL | Status: DC
  Administered 2014-12-27 – 2015-01-01 (×12): 500 mg via ORAL
  Filled 2014-12-26 (×12): qty 1

## 2014-12-26 MED ORDER — SODIUM CHLORIDE 0.9 % IJ SOLN
3.0000 mL | Freq: Two times a day (BID) | INTRAMUSCULAR | Status: DC
  Administered 2014-12-26 – 2014-12-31 (×3): 3 mL via INTRAVENOUS

## 2014-12-26 MED ORDER — ACETAMINOPHEN 650 MG RE SUPP: 650.0000 mg | Freq: Four times a day (QID) | RECTAL | Status: DC | PRN

## 2014-12-26 MED ORDER — HYDROMORPHONE HCL 1 MG/ML IJ SOLN
1.0000 mg | Freq: Once | INTRAMUSCULAR | Status: AC
  Administered 2014-12-26: 1 mg via INTRAVENOUS
  Filled 2014-12-26: qty 1

## 2014-12-26 MED ORDER — ONDANSETRON HCL 4 MG/2ML IJ SOLN
4.0000 mg | Freq: Once | INTRAMUSCULAR | Status: AC
  Administered 2014-12-26: 4 mg via INTRAVENOUS
  Filled 2014-12-26: qty 2

## 2014-12-26 MED ORDER — ONDANSETRON HCL 4 MG/2ML IJ SOLN: 4.0000 mg | Freq: Three times a day (TID) | INTRAMUSCULAR | Status: AC | PRN

## 2014-12-26 MED ORDER — HYDROMORPHONE HCL 1 MG/ML IJ SOLN: 2.0000 mg | INTRAMUSCULAR | Status: DC | PRN

## 2014-12-26 MED ORDER — ENOXAPARIN SODIUM 40 MG/0.4ML ~~LOC~~ SOLN
40.0000 mg | SUBCUTANEOUS | Status: DC
  Administered 2014-12-27 – 2014-12-29 (×3): 40 mg via SUBCUTANEOUS
  Filled 2014-12-26 (×3): qty 0.4

## 2014-12-26 MED ORDER — IPRATROPIUM-ALBUTEROL 0.5-2.5 (3) MG/3ML IN SOLN
3.0000 mL | Freq: Once | RESPIRATORY_TRACT | Status: AC
  Administered 2014-12-26: 3 mL via RESPIRATORY_TRACT
  Filled 2014-12-26: qty 3

## 2014-12-26 MED ORDER — MOMETASONE FURO-FORMOTEROL FUM 100-5 MCG/ACT IN AERO
2.0000 | INHALATION_SPRAY | Freq: Two times a day (BID) | RESPIRATORY_TRACT | Status: DC
  Administered 2014-12-27 – 2014-12-31 (×9): 2 via RESPIRATORY_TRACT
  Filled 2014-12-26: qty 8.8

## 2014-12-26 MED ORDER — SODIUM CHLORIDE 0.9 % IV SOLN
INTRAVENOUS | Status: DC
  Administered 2014-12-27 (×2): via INTRAVENOUS

## 2014-12-26 MED ORDER — OXYCODONE-ACETAMINOPHEN 5-325 MG PO TABS
ORAL_TABLET | Freq: Four times a day (QID) | ORAL | Status: DC
  Administered 2014-12-27 – 2015-01-01 (×22): 1 via ORAL
  Filled 2014-12-26 (×22): qty 1

## 2014-12-26 MED ORDER — SODIUM CHLORIDE 0.9 % IV BOLUS (SEPSIS)
1000.0000 mL | Freq: Once | INTRAVENOUS | Status: AC
  Administered 2014-12-26: 1000 mL via INTRAVENOUS

## 2014-12-26 MED ORDER — HYDROMORPHONE HCL 1 MG/ML IJ SOLN
1.0000 mg | INTRAMUSCULAR | Status: DC | PRN
  Administered 2014-12-26: 1 mg via INTRAVENOUS
  Filled 2014-12-26: qty 1

## 2014-12-26 MED ORDER — ACETAMINOPHEN 325 MG PO TABS
650.0000 mg | ORAL_TABLET | Freq: Four times a day (QID) | ORAL | Status: DC | PRN
  Administered 2015-01-01: 650 mg via ORAL
  Filled 2014-12-26: qty 2

## 2014-12-26 MED ORDER — M.V.I. ADULT IV INJ
INJECTION | Freq: Once | INTRAVENOUS | Status: AC
  Administered 2014-12-27: 02:00:00 via INTRAVENOUS
  Filled 2014-12-26: qty 1000

## 2014-12-26 MED ORDER — DIAZEPAM 5 MG/ML IJ SOLN
5.0000 mg | INTRAMUSCULAR | Status: DC | PRN
  Administered 2014-12-27 (×2): 5 mg via INTRAVENOUS
  Administered 2014-12-28: 10 mg via INTRAVENOUS
  Administered 2014-12-28 (×3): 5 mg via INTRAVENOUS
  Administered 2014-12-29 – 2014-12-30 (×10): 10 mg via INTRAVENOUS
  Administered 2014-12-31: 5 mg via INTRAVENOUS
  Administered 2014-12-31: 10 mg via INTRAVENOUS
  Filled 2014-12-26 (×21): qty 2

## 2014-12-26 MED ORDER — CARBAMAZEPINE 200 MG PO TABS
400.0000 mg | ORAL_TABLET | Freq: Two times a day (BID) | ORAL | Status: DC
  Administered 2014-12-27 – 2015-01-01 (×12): 400 mg via ORAL
  Filled 2014-12-26 (×12): qty 2

## 2014-12-26 MED ORDER — SODIUM CHLORIDE 0.9 % IV SOLN
INTRAVENOUS | Status: DC
  Administered 2014-12-26: 20:00:00 via INTRAVENOUS

## 2014-12-26 MED ORDER — ALBUTEROL SULFATE HFA 108 (90 BASE) MCG/ACT IN AERS: 2.0000 | INHALATION_SPRAY | Freq: Four times a day (QID) | RESPIRATORY_TRACT | Status: DC | PRN

## 2014-12-26 MED ORDER — IOHEXOL 300 MG/ML  SOLN
50.0000 mL | Freq: Once | INTRAMUSCULAR | Status: AC | PRN
  Administered 2014-12-26: 50 mL via ORAL

## 2014-12-26 NOTE — ED Provider Notes (Addendum)
CSN: [id number]     Arrival date & time 12/26/14  1646 History  This chart was scribed for Fredia Sorrow, MD by Hilda Lias, ED Scribe. This patient was seen in room [facility]14/[facility]14 and the patient's care was started at 5:45 PM.     Chief Complaint  Patient presents with  . Abdominal Pain     Patient is a 39 y.o. male presenting with abdominal pain. The history is provided by the patient. No language interpreter was used.  Abdominal Pain Pain location:  Epigastric Pain quality: sharp   Pain radiates to:  Does not radiate Pain severity:  Severe Duration:  5 days Timing:  Constant Progression:  Worsening Chronicity:  Recurrent Associated symptoms: no chest pain, no chills, no cough, no diarrhea, no dysuria, no fever, no nausea, no shortness of breath and no vomiting     HPI Comments: [name] is a 39 y.o. male with a history of Pancreatitis who presents to the Emergency Department complaining of constant, worsening epigastric abdominal pain that he describes as sharp and 10/10 in severity that has been present for five days. Pt states the pain does not radiate anywhere else, and states that it reminds him of the pancreatitis that he had before. Pt denies nausea and vomiting.    Past Medical History  Diagnosis Date  . Pancreatitis   . Seizures   . Back pain   . ETOH abuse   . COPD (chronic obstructive pulmonary disease)   . SAH (subarachnoid hemorrhage)   . Macrocytosis 08/17/2011  . Bradycardia 08/17/2011  . Pneumonia    Past Surgical History  Procedure Laterality Date  . Back surgery    . Septoplasty     Family History  Problem Relation Age of Onset  . Seizures Father   . Alcohol abuse Father   . Coronary artery disease Mother     deceased age 24   History  Substance Use Topics  . Smoking status: Current Every Day Smoker -- 1.00 packs/day for 20 years    Types: Cigarettes  . Smokeless tobacco: Never Used  . Alcohol Use: 25.2 oz/week    42 Cans of beer  per week     Comment: daily    Review of Systems  Constitutional: Negative for fever and chills.  HENT: Negative for congestion.   Eyes: Negative for visual disturbance.  Respiratory: Negative for cough and shortness of breath.   Cardiovascular: Negative for chest pain and leg swelling.  Gastrointestinal: Positive for abdominal pain. Negative for nausea, vomiting and diarrhea.  Genitourinary: Negative for dysuria.  Musculoskeletal: Positive for back pain.       Chronic back pain  Skin: Negative for rash.  Neurological: Positive for headaches.  Hematological: Does not bruise/bleed easily.  Psychiatric/Behavioral: Negative for confusion.      Allergies  Review of patient's allergies indicates no known allergies.  Home Medications   Prior to Admission medications   Medication Sig Start Date End Date Taking? Authorizing Provider  albuterol (PROVENTIL HFA;VENTOLIN HFA) 108 (90 BASE) MCG/ACT inhaler Inhale 2 puffs into the lungs every 6 (six) hours as needed for wheezing or shortness of breath.   Yes Historical Provider, MD  albuterol (PROVENTIL) (2.5 MG/3ML) 0.083% nebulizer solution Take 3 mLs (2.5 mg total) by nebulization every 4 (four) hours as needed for wheezing or shortness of breath. 09/09/13  Yes Orpah Greek, MD  carbamazepine (TEGRETOL) 200 MG tablet Take 400 mg by mouth 2 (two) times daily.   Yes Historical  Provider, MD  Fluticasone-Salmeterol (ADVAIR) 250-50 MCG/DOSE AEPB Inhale 1 puff into the lungs 2 (two) times daily.   Yes Historical Provider, MD  levETIRAcetam (KEPPRA) 500 MG tablet Take 500 mg by mouth 2 (two) times daily.    Yes Historical Provider, MD  oxyCODONE-acetaminophen (PERCOCET) 10-325 MG per tablet Take 1 tablet by mouth 4 (four) times daily.    Yes Historical Provider, MD  ibuprofen (ADVIL,MOTRIN) 800 MG tablet Take 1 tablet (800 mg total) by mouth 3 (three) times daily. Patient not taking: Reported on 12/26/2014 07/03/14   Britt Bottom, NP   pantoprazole (PROTONIX) 20 MG tablet Take 1 tablet (20 mg total) by mouth daily. Patient not taking: Reported on 12/26/2014 03/03/14   Carmin Muskrat, MD   BP 167/89 mmHg  Pulse 70  Temp(Src) 97.9 F (36.6 C) (Oral)  Resp 20  Ht 5\' 9"  (1.753 m)  Wt 132 lb (59.875 kg)  BMI 19.48 kg/m2  SpO2 94% Physical Exam  Constitutional: He is oriented to person, place, and time. He appears well-developed and well-nourished.  HENT:  Head: Normocephalic and atraumatic.  Mouth/Throat: Oropharynx is clear and moist.  Mucous membranes dry  Eyes: Conjunctivae are normal. Pupils are equal, round, and reactive to light.  Sclera clear  Neck: Normal range of motion. Neck supple.  Cardiovascular: Normal rate, regular rhythm and normal heart sounds.   No murmur heard. Pulmonary/Chest: Effort normal and breath sounds normal. No respiratory distress.  Abdominal: Soft. Bowel sounds are normal. There is tenderness.  Tender in epigastric area  Musculoskeletal: Normal range of motion. He exhibits no edema.  No swelling in ankles  Neurological: He is alert and oriented to person, place, and time. No cranial nerve deficit. He exhibits normal muscle tone. Coordination normal.  Skin: Skin is warm. No rash noted.  Nursing note and vitals reviewed.   ED Course  Procedures (including critical care time)  DIAGNOSTIC STUDIES: Oxygen Saturation is 100% on RA, normal by my interpretation.    COORDINATION OF CARE: 5:49 PM Discussed treatment plan with pt at bedside and pt agreed to plan.   Labs Review Labs Reviewed  COMPREHENSIVE METABOLIC PANEL - Abnormal; Notable for the following:    Sodium 122 (*)    Chloride 88 (*)    BUN 5 (*)    Creatinine, Ser 0.35 (*)    AST 59 (*)    Alkaline Phosphatase 128 (*)    All other components within normal limits  CBC WITH DIFFERENTIAL/PLATELET - Abnormal; Notable for the following:    WBC 13.0 (*)    MCH 34.7 (*)    Neutro Abs 9.1 (*)    Monocytes Absolute 1.1 (*)     All other components within normal limits  ETHANOL - Abnormal; Notable for the following:    Alcohol, Ethyl (B) 73 (*)    All other components within normal limits  LIPASE, BLOOD  URINALYSIS, ROUTINE W REFLEX MICROSCOPIC   Results for orders placed or performed during the hospital encounter of 12/26/14  Comprehensive metabolic panel  Result Value Ref Range   Sodium 122 (L) 135 - 145 mmol/L   Potassium 3.9 3.5 - 5.1 mmol/L   Chloride 88 (L) 96 - 112 mmol/L   CO2 22 19 - 32 mmol/L   Glucose, Bld 93 70 - 99 mg/dL   BUN 5 (L) 6 - 23 mg/dL   Creatinine, Ser 0.35 (L) 0.50 - 1.35 mg/dL   Calcium 8.4 8.4 - 10.5 mg/dL   Total Protein 8.2 6.0 -  8.3 g/dL   Albumin 4.5 3.5 - 5.2 g/dL   AST 59 (H) 0 - 37 U/L   ALT 40 0 - 53 U/L   Alkaline Phosphatase 128 (H) 39 - 117 U/L   Total Bilirubin 0.5 0.3 - 1.2 mg/dL   GFR calc non Af Amer >90 >90 mL/min   GFR calc Af Amer >90 >90 mL/min   Anion gap 12 5 - 15  Lipase, blood  Result Value Ref Range   Lipase 40 11 - 59 U/L  CBC with Differential/Platelet  Result Value Ref Range   WBC 13.0 (H) 4.0 - 10.5 K/uL   RBC 4.29 4.22 - 5.81 MIL/uL   Hemoglobin 14.9 13.0 - 17.0 g/dL   HCT 42.6 39.0 - 52.0 %   MCV 99.3 78.0 - 100.0 fL   MCH 34.7 (H) 26.0 - 34.0 pg   MCHC 35.0 30.0 - 36.0 g/dL   RDW 12.1 11.5 - 15.5 %   Platelets 279 150 - 400 K/uL   Neutrophils Relative % 70 43 - 77 %   Neutro Abs 9.1 (H) 1.7 - 7.7 K/uL   Lymphocytes Relative 20 12 - 46 %   Lymphs Abs 2.6 0.7 - 4.0 K/uL   Monocytes Relative 9 3 - 12 %   Monocytes Absolute 1.1 (H) 0.1 - 1.0 K/uL   Eosinophils Relative 1 0 - 5 %   Eosinophils Absolute 0.2 0.0 - 0.7 K/uL   Basophils Relative 0 0 - 1 %   Basophils Absolute 0.0 0.0 - 0.1 K/uL  Ethanol  Result Value Ref Range   Alcohol, Ethyl (B) 73 (H) 0 - 9 mg/dL     Imaging Review Ct Abdomen Pelvis W Contrast  12/26/2014   CLINICAL DATA:  Sharp epigastric abdominal pain for 1 day. Similar symptoms in the past associated with  pancreatitis. Nausea and vomiting. History of alcohol abuse.  EXAM: CT ABDOMEN AND PELVIS WITH CONTRAST  TECHNIQUE: Multidetector CT imaging of the abdomen and pelvis was performed using the standard protocol following bolus administration of intravenous contrast.  CONTRAST:  163mL OMNIPAQUE IOHEXOL 300 MG/ML SOLN, 44mL OMNIPAQUE IOHEXOL 300 MG/ML SOLN  COMPARISON:  Acute abdominal series December 26, 2013 at [place] hours and CT of the abdomen and pelvis October 20, 2013  FINDINGS: LUNG BASES: Included view of the lung bases are clear. Visualized heart and pericardium are unremarkable.  SOLID ORGANS: 22 x 22 mm cystic mass in pancreatic head associated with coarse calcifications, new from prior imaging. Mild pancreatic atrophy distal to this. No ductal dilatation. Faint inflammatory changes and small reactive lymph nodes about the pancreatic head. No focal areas of pancreatic hypoenhancement. The spleen, gallbladder, and adrenal glands are unremarkable. Mild focal fatty infiltration about the falciform ligament, the liver is otherwise unremarkable.  GASTROINTESTINAL TRACT: Mildly prominent small bowel in LEFT upper quadrant. The stomach, and large bowel are normal in course and caliber without inflammatory changes. Normal appendix.  KIDNEYS/ URINARY TRACT: Kidneys are orthotopic, demonstrating symmetric enhancement. No nephrolithiasis, hydronephrosis or solid renal masses. Too small to characterize hypodensity lower pole of LEFT kidney. The unopacified ureters are normal in course and caliber. Urinary bladder is partially distended and unremarkable.  PERITONEUM/RETROPERITONEUM: Aortoiliac vessels are normal in course and caliber, moderate calcific atherosclerosis, advanced for age. Scratch No lymphadenopathy by CT size criteria. The prostate is mildly enlarged, similar. Prominent appearance of the seminal vesicles without inflammation, similar. No intraperitoneal free fluid nor free air.  SOFT TISSUE/OSSEOUS  STRUCTURES: Large broad-based disc  osteophyte complexes at L4-5 and L5-S1 resultant moderate to severe canal stenosis at L4-5, at least mild at L5-S1, and moderate to severe L5-S1 neural foraminal narrowing.  IMPRESSION: 22 x 22 mm pseudocyst and pancreatic head with mild inflammation an calcifications suggesting acute on chronic pancreatitis without focal necrosis.  Mildly prominent small bowel may be seen with ileus, no bowel obstruction.  Mildly enlarged prostate and seminal vesicles could reflect sequelae of prostatitis/seminal vesiculitis without discrete abscess.  Advanced atherosclerosis for age.  Next   Electronically Signed   By: Elon Alas   On: 12/26/2014 21:13   Dg Abd Acute W/chest  12/26/2014   CLINICAL DATA:  Epigastric pain x5 days  EXAM: ACUTE ABDOMEN SERIES (ABDOMEN 2 VIEW & CHEST 1 VIEW)  COMPARISON:  Chest radiographs dated 03/03/2014  FINDINGS: Chronic interstitial markings/emphysematous changes. Mild scarring in the right upper lobe and left lung apex. No focal consolidation. No pleural effusion or pneumothorax.  The heart is normal in size.  Mildly prominent loops of small bowel in the left mid abdomen, possibly reflecting partial small bowel obstruction or small bowel ileus.  No evidence of free air under the diaphragm on the upright view.  IMPRESSION: No evidence of acute cardiopulmonary disease.  Mildly prominent loops of small bowel in the left mid abdomen, possibly reflecting partial small bowel obstruction or small bowel ileus.   Electronically Signed   By: Julian Hy M.D.   On: 12/26/2014 19:19     EKG Interpretation None      MDM   Final diagnoses:  Epigastric abdominal pain  Hyponatremia  Abdominal pain  Acute pancreatitis, unspecified pancreatitis type    Patient with acute on chronic pancreatitis. CT scan does show evidence of pancreatic inflammation and does have a 2 x 2 centimeters pseudocyst. No evidence of abscess. Patient improved here some  with pain medicine the pain not completely relieved. No nausea vomiting or diarrhea. Patient also with significant hyponatremia. Treated with normal saline. Patient will be admitted to telemetry. Patient with no evidence of bowel obstruction.     I personally performed the services described in this documentation, which was scribed in my presence. The recorded information has been reviewed and is accurate.      Fredia Sorrow, MD 12/26/14 1751  Fredia Sorrow, MD 12/26/14 2219

## 2014-12-26 NOTE — H&P (Signed)
[name] is an 39 y.o. male.    [name] (pcp)  Chief Complaint: abdominal pain HPI: 39 yo male with pancreatitis, etoh abuse, tobacco use, seizure do, apparently c/o 5 days of abdominal pain, in the epigastric area.  Pt states that the pain was constant and not resolving and therefore presented to the ED.   Denies fever, chills, n/v, diarrhea, brbpr, black stool. Pt came to ED and lipase wnl , however CT scan showed pancreatitis and pseudocyst.  And hyponatremia.  Pt will be admitted for pancreatitis, hyponatremia.   Past Medical History  Diagnosis Date  . Pancreatitis   . Seizures   . Back pain   . ETOH abuse   . COPD (chronic obstructive pulmonary disease)   . SAH (subarachnoid hemorrhage)   . Macrocytosis 08/17/2011  . Bradycardia 08/17/2011  . Pneumonia   . GERD (gastroesophageal reflux disease)     Past Surgical History  Procedure Laterality Date  . Back surgery    . Septoplasty      Family History  Problem Relation Age of Onset  . Seizures Father   . Alcohol abuse Father   . Coronary artery disease Mother     deceased age 56   Social History:  reports that he has been smoking Cigarettes.  He has a 20 pack-year smoking history. He has never used smokeless tobacco. He reports that he drinks about 25.2 oz of alcohol per week. He reports that he uses illicit drugs (Marijuana).  Allergies: No Known Allergies Medications reviewed  (Not in a hospital admission)  Results for orders placed or performed during the hospital encounter of 12/26/14 (from the past 48 hour(s))  Comprehensive metabolic panel     Status: Abnormal   Collection Time: 12/26/14  6:11 PM  Result Value Ref Range   Sodium 122 (L) 135 - 145 mmol/L   Potassium 3.9 3.5 - 5.1 mmol/L   Chloride 88 (L) 96 - 112 mmol/L   CO2 22 19 - 32 mmol/L   Glucose, Bld 93 70 - 99 mg/dL   BUN 5 (L) 6 - 23 mg/dL   Creatinine, Ser 0.35 (L) 0.50 - 1.35 mg/dL   Calcium 8.4 8.4 - 10.5 mg/dL   Total Protein  8.2 6.0 - 8.3 g/dL   Albumin 4.5 3.5 - 5.2 g/dL   AST 59 (H) 0 - 37 U/L   ALT 40 0 - 53 U/L   Alkaline Phosphatase 128 (H) 39 - 117 U/L   Total Bilirubin 0.5 0.3 - 1.2 mg/dL   GFR calc non Af Amer >90 >90 mL/min   GFR calc Af Amer >90 >90 mL/min    Comment: (NOTE) The eGFR has been calculated using the CKD EPI equation. This calculation has not been validated in all clinical situations. eGFR's persistently <90 mL/min signify possible Chronic Kidney Disease.    Anion gap 12 5 - 15  Lipase, blood     Status: None   Collection Time: 12/26/14  6:11 PM  Result Value Ref Range   Lipase 40 11 - 59 U/L  CBC with Differential/Platelet     Status: Abnormal   Collection Time: 12/26/14  6:11 PM  Result Value Ref Range   WBC 13.0 (H) 4.0 - 10.5 K/uL   RBC 4.29 4.22 - 5.81 MIL/uL   Hemoglobin 14.9 13.0 - 17.0 g/dL   HCT 42.6 39.0 - 52.0 %   MCV 99.3 78.0 - 100.0 fL   MCH 34.7 (H) 26.0 - 34.0 pg  MCHC 35.0 30.0 - 36.0 g/dL   RDW 12.1 11.5 - 15.5 %   Platelets 279 150 - 400 K/uL   Neutrophils Relative % 70 43 - 77 %   Neutro Abs 9.1 (H) 1.7 - 7.7 K/uL   Lymphocytes Relative 20 12 - 46 %   Lymphs Abs 2.6 0.7 - 4.0 K/uL   Monocytes Relative 9 3 - 12 %   Monocytes Absolute 1.1 (H) 0.1 - 1.0 K/uL   Eosinophils Relative 1 0 - 5 %   Eosinophils Absolute 0.2 0.0 - 0.7 K/uL   Basophils Relative 0 0 - 1 %   Basophils Absolute 0.0 0.0 - 0.1 K/uL  Ethanol     Status: Abnormal   Collection Time: 12/26/14  6:11 PM  Result Value Ref Range   Alcohol, Ethyl (B) 73 (H) 0 - 9 mg/dL    Comment:        LOWEST DETECTABLE LIMIT FOR SERUM ALCOHOL IS 11 mg/dL FOR MEDICAL PURPOSES ONLY    Ct Abdomen Pelvis W Contrast  12/26/2014   CLINICAL DATA:  Sharp epigastric abdominal pain for 1 day. Similar symptoms in the past associated with pancreatitis. Nausea and vomiting. History of alcohol abuse.  EXAM: CT ABDOMEN AND PELVIS WITH CONTRAST  TECHNIQUE: Multidetector CT imaging of the abdomen and pelvis was  performed using the standard protocol following bolus administration of intravenous contrast.  CONTRAST:  153m OMNIPAQUE IOHEXOL 300 MG/ML SOLN, 533mOMNIPAQUE IOHEXOL 300 MG/ML SOLN  COMPARISON:  Ac[place] abdominal [place] December 26, 2013 [place] and CT of the abdomen and pelvis October 20, 2013  FINDINGS: LUNG BASES: Included view of the lung bases are clear. Visualized heart and pericardium are unremarkable.  SOLID ORGANS: 22 x 22 mm cystic mass in pancreatic head associated with coarse calcifications, new from prior imaging. Mild pancreatic atrophy distal to this. No ductal dilatation. Faint inflammatory changes and small reactive lymph nodes about the pancreatic head. No focal areas of pancreatic hypoenhancement. The spleen, gallbladder, and adrenal glands are unremarkable. Mild focal fatty infiltration about the falciform ligament, the liver is otherwise unremarkable.  GASTROINTESTINAL TRACT: Mildly prominent small bowel in LEFT upper quadrant. The stomach, and large bowel are normal in course and caliber without inflammatory changes. Normal appendix.  KIDNEYS/ URINARY TRACT: Kidneys are orthotopic, demonstrating symmetric enhancement. No nephrolithiasis, hydronephrosis or solid renal masses. Too small to characterize hypodensity lower pole of LEFT kidney. The unopacified ureters are normal in course and caliber. Urinary bladder is partially distended and unremarkable.  PERITONEUM/RETROPERITONEUM: Aortoiliac vessels are normal in course and caliber, moderate calcific atherosclerosis, advanced for age. Scratch No lymphadenopathy by CT size criteria. The prostate is mildly enlarged, similar. Prominent appearance of the seminal vesicles without inflammation, similar. No intraperitoneal free fluid nor free air.  SOFT TISSUE/OSSEOUS STRUCTURES: Large broad-based disc osteophyte complexes at L4-5 and L5-S1 resultant moderate to severe canal stenosis at L4-5, at least mild at L5-S1, and moderate to severe L5-S1  neural foraminal narrowing.  IMPRESSION: 22 x 22 mm pseudocyst and pancreatic head with mild inflammation an calcifications suggesting acute on chronic pancreatitis without focal necrosis.  Mildly prominent small bowel may be seen with ileus, no bowel obstruction.  Mildly enlarged prostate and seminal vesicles could reflect sequelae of prostatitis/seminal vesiculitis without discrete abscess.  Advanced atherosclerosis for age.  Next   Electronically Signed   By: CoElon Alas On: 12/26/2014 21:13   Dg Abd Acute W/chest  12/26/2014   CLINICAL DATA:  Epigastric  pain x5 days  EXAM: ACUTE ABDOMEN SERIES (ABDOMEN 2 VIEW & CHEST 1 VIEW)  COMPARISON:  Chest radiographs dated 03/03/2014  FINDINGS: Chronic interstitial markings/emphysematous changes. Mild scarring in the right upper lobe and left lung apex. No focal consolidation. No pleural effusion or pneumothorax.  The heart is normal in size.  Mildly prominent loops of small bowel in the left mid abdomen, possibly reflecting partial small bowel obstruction or small bowel ileus.  No evidence of free air under the diaphragm on the upright view.  IMPRESSION: No evidence of acute cardiopulmonary disease.  Mildly prominent loops of small bowel in the left mid abdomen, possibly reflecting partial small bowel obstruction or small bowel ileus.   Electronically Signed   By: Julian Hy M.D.   On: 12/26/2014 19:19    Review of Systems  Constitutional: Negative for fever, chills, weight loss, malaise/fatigue and diaphoresis.  HENT: Negative for congestion, ear discharge, ear pain, hearing loss, nosebleeds, sore throat and tinnitus.   Eyes: Negative for blurred vision, double vision, photophobia, pain, discharge and redness.  Respiratory: Negative for cough, hemoptysis, sputum production, shortness of breath, wheezing and stridor.   Cardiovascular: Negative for chest pain, palpitations, orthopnea, claudication, leg swelling and PND.  Gastrointestinal:  Positive for abdominal pain. Negative for heartburn, nausea, vomiting, diarrhea, constipation, blood in stool and melena.  Genitourinary: Negative for dysuria, urgency, frequency, hematuria and flank pain.  Musculoskeletal: Negative for myalgias, back pain, joint pain, falls and neck pain.  Skin: Negative for itching and rash.  Neurological: Negative for dizziness, tingling, tremors, sensory change, speech change, focal weakness, seizures, loss of consciousness, weakness and headaches.  Endo/Heme/Allergies: Negative for environmental allergies and polydipsia. Does not bruise/bleed easily.  Psychiatric/Behavioral: Negative for depression, suicidal ideas, hallucinations, memory loss and substance abuse. The patient is not nervous/anxious and does not have insomnia.     Blood pressure 167/89, pulse 70, temperature 97.9 F (36.6 C), temperature source Oral, resp. rate 20, height 5' 9"  (1.753 m), weight 59.875 kg (132 lb), SpO2 99 %. Physical Exam  Constitutional: He is oriented to person, place, and time. He appears well-developed and well-nourished.  HENT:  Head: Normocephalic and atraumatic.  Mouth/Throat: No oropharyngeal exudate.  Eyes: Conjunctivae and EOM are normal. Pupils are equal, round, and reactive to light. No scleral icterus.  Neck: Normal range of motion. Neck supple. No JVD present. No tracheal deviation present. No thyromegaly present.  Cardiovascular: Normal rate and regular rhythm.  Exam reveals no gallop and no friction rub.   No murmur heard. Respiratory: Effort normal and breath sounds normal. No respiratory distress. He has no wheezes. He has no rales.  GI: Soft. Bowel sounds are normal. He exhibits no distension. There is no tenderness. There is no rebound and no guarding.  Musculoskeletal: Normal range of motion. He exhibits no edema or tenderness.  Lymphadenopathy:    He has no cervical adenopathy.  Neurological: He is alert and oriented to person, place, and time. He  has normal reflexes. He displays normal reflexes. No cranial nerve deficit. Coordination normal.  Skin: Skin is warm and dry. No rash noted. No erythema. No pallor.  Negative cullens sign  Psychiatric: He has a normal mood and affect. His behavior is normal. Judgment and thought content normal.     Assessment/Plan Pancreatitis , Pancreatic pseudocyst NPO NS iv Pain control with dilaudid 15m iv q4h prn  Etoh dependence ciwa protocol  Hyponatremia Check serum osm, tsh, cortisol, urine osm, urine sodium Hydrate gently with ns iv  Tobacco use Pt was counselled on smoking cessation x 61mnutes  DVT prophylaxis: scd and lovenox  KJani Gravel2/28/2016, 10:26 PM

## 2014-12-26 NOTE — ED Notes (Signed)
Pt reports onset of abdominal pain approx 5 days ago. States he thinks it may be his pancreatitis.

## 2014-12-27 ENCOUNTER — Encounter (HOSPITAL_COMMUNITY): Payer: Self-pay | Admitting: Gastroenterology

## 2014-12-27 DIAGNOSIS — E778 Other disorders of glycoprotein metabolism: Secondary | ICD-10-CM | POA: Diagnosis present

## 2014-12-27 DIAGNOSIS — Z79891 Long term (current) use of opiate analgesic: Secondary | ICD-10-CM | POA: Diagnosis not present

## 2014-12-27 DIAGNOSIS — E871 Hypo-osmolality and hyponatremia: Secondary | ICD-10-CM | POA: Diagnosis present

## 2014-12-27 DIAGNOSIS — Z7951 Long term (current) use of inhaled steroids: Secondary | ICD-10-CM | POA: Diagnosis not present

## 2014-12-27 DIAGNOSIS — K219 Gastro-esophageal reflux disease without esophagitis: Secondary | ICD-10-CM | POA: Diagnosis present

## 2014-12-27 DIAGNOSIS — F1020 Alcohol dependence, uncomplicated: Secondary | ICD-10-CM | POA: Diagnosis present

## 2014-12-27 DIAGNOSIS — K863 Pseudocyst of pancreas: Secondary | ICD-10-CM | POA: Diagnosis present

## 2014-12-27 DIAGNOSIS — K852 Alcohol induced acute pancreatitis: Secondary | ICD-10-CM | POA: Diagnosis present

## 2014-12-27 DIAGNOSIS — K860 Alcohol-induced chronic pancreatitis: Secondary | ICD-10-CM | POA: Diagnosis present

## 2014-12-27 DIAGNOSIS — J449 Chronic obstructive pulmonary disease, unspecified: Secondary | ICD-10-CM | POA: Diagnosis present

## 2014-12-27 DIAGNOSIS — F17210 Nicotine dependence, cigarettes, uncomplicated: Secondary | ICD-10-CM | POA: Diagnosis present

## 2014-12-27 DIAGNOSIS — K859 Acute pancreatitis without necrosis or infection, unspecified: Secondary | ICD-10-CM | POA: Insufficient documentation

## 2014-12-27 DIAGNOSIS — Z811 Family history of alcohol abuse and dependence: Secondary | ICD-10-CM | POA: Diagnosis not present

## 2014-12-27 DIAGNOSIS — R1013 Epigastric pain: Secondary | ICD-10-CM | POA: Diagnosis present

## 2014-12-27 DIAGNOSIS — R569 Unspecified convulsions: Secondary | ICD-10-CM | POA: Diagnosis present

## 2014-12-27 DIAGNOSIS — Z8249 Family history of ischemic heart disease and other diseases of the circulatory system: Secondary | ICD-10-CM | POA: Diagnosis not present

## 2014-12-27 LAB — LIPID PANEL
Cholesterol: 201 mg/dL — ABNORMAL HIGH (ref 0–200)
HDL: 117 mg/dL (ref >39)
LDL CALC: 70 mg/dL (ref 0–99)
TRIGLYCERIDES: 71 mg/dL (ref <150)
Total CHOL/HDL Ratio: 1.7 RATIO
VLDL: 14 mg/dL (ref 0–40)

## 2014-12-27 LAB — COMPREHENSIVE METABOLIC PANEL
ALBUMIN: 4.3 g/dL (ref 3.5–5.2)
ALK PHOS: 122 U/L — AB (ref 39–117)
ALT: 34 U/L (ref 0–53)
AST: 51 U/L — ABNORMAL HIGH (ref 0–37)
Anion gap: 13 (ref 5–15)
BILIRUBIN TOTAL: 0.5 mg/dL (ref 0.3–1.2)
BUN: 6 mg/dL (ref 6–23)
CHLORIDE: 96 mmol/L (ref 96–112)
CO2: 25 mmol/L (ref 19–32)
CREATININE: 0.4 mg/dL — AB (ref 0.50–1.35)
Calcium: 9 mg/dL (ref 8.4–10.5)
GFR calc non Af Amer: >90 mL/min (ref >90)
Glucose, Bld: 103 mg/dL — ABNORMAL HIGH (ref 70–99)
Potassium: 4 mmol/L (ref 3.5–5.1)
SODIUM: 134 mmol/L — AB (ref 135–145)
Total Protein: 7.7 g/dL (ref 6.0–8.3)

## 2014-12-27 LAB — URINALYSIS, ROUTINE W REFLEX MICROSCOPIC
Bilirubin Urine: NEGATIVE
Glucose, UA: NEGATIVE mg/dL
Hgb urine dipstick: NEGATIVE
Ketones, ur: NEGATIVE mg/dL
Leukocytes, UA: NEGATIVE
Nitrite: NEGATIVE
Protein, ur: NEGATIVE mg/dL
Specific Gravity, Urine: <1.005 — ABNORMAL LOW (ref 1.005–1.030)
Urobilinogen, UA: 0.2 mg/dL (ref 0.0–1.0)
pH: 6 (ref 5.0–8.0)

## 2014-12-27 LAB — TSH: TSH: 1.838 u[IU]/mL (ref 0.350–4.500)

## 2014-12-27 LAB — CBC
HCT: 41.9 % (ref 39.0–52.0)
Hemoglobin: 14.3 g/dL (ref 13.0–17.0)
MCH: 34.9 pg — ABNORMAL HIGH (ref 26.0–34.0)
MCHC: 34.1 g/dL (ref 30.0–36.0)
MCV: 102.2 fL — ABNORMAL HIGH (ref 78.0–100.0)
Platelets: 275 10*3/uL (ref 150–400)
RBC: 4.1 MIL/uL — ABNORMAL LOW (ref 4.22–5.81)
RDW: 12.3 % (ref 11.5–15.5)
WBC: 8.3 10*3/uL (ref 4.0–10.5)

## 2014-12-27 LAB — BASIC METABOLIC PANEL
ANION GAP: 7 (ref 5–15)
BUN: 5 mg/dL — ABNORMAL LOW (ref 6–23)
CALCIUM: 8.7 mg/dL (ref 8.4–10.5)
CO2: 23 mmol/L (ref 19–32)
Chloride: 100 mmol/L (ref 96–112)
Creatinine, Ser: 0.37 mg/dL — ABNORMAL LOW (ref 0.50–1.35)
Glucose, Bld: 151 mg/dL — ABNORMAL HIGH (ref 70–99)
POTASSIUM: 3.9 mmol/L (ref 3.5–5.1)
SODIUM: 130 mmol/L — AB (ref 135–145)

## 2014-12-27 LAB — SODIUM, URINE, RANDOM: Sodium, Ur: 31 mmol/L

## 2014-12-27 LAB — CORTISOL: Cortisol, Plasma: 20.8 ug/dL

## 2014-12-27 LAB — OSMOLALITY: Osmolality: 269 mOsm/kg — ABNORMAL LOW (ref 275–300)

## 2014-12-27 LAB — LACTATE DEHYDROGENASE: LDH: 126 U/L (ref 94–250)

## 2014-12-27 MED ORDER — ALBUTEROL SULFATE (2.5 MG/3ML) 0.083% IN NEBU
2.5000 mg | INHALATION_SOLUTION | Freq: Four times a day (QID) | RESPIRATORY_TRACT | Status: DC
  Administered 2014-12-28 – 2015-01-01 (×17): 2.5 mg via RESPIRATORY_TRACT
  Filled 2014-12-27 (×18): qty 3

## 2014-12-27 MED ORDER — FOLIC ACID 5 MG/ML IJ SOLN
INTRAMUSCULAR | Status: AC
  Filled 2014-12-27: qty 0.2

## 2014-12-27 MED ORDER — M.V.I. ADULT IV INJ
INJECTION | INTRAVENOUS | Status: AC
  Filled 2014-12-27: qty 10

## 2014-12-27 MED ORDER — HYDROMORPHONE HCL 1 MG/ML IJ SOLN
2.0000 mg | INTRAMUSCULAR | Status: DC | PRN
  Administered 2014-12-27 (×5): 2 mg via INTRAVENOUS
  Filled 2014-12-27 (×5): qty 2

## 2014-12-27 MED ORDER — HYDROMORPHONE HCL 1 MG/ML IJ SOLN
2.0000 mg | INTRAMUSCULAR | Status: DC | PRN
  Administered 2014-12-27 – 2014-12-31 (×37): 2 mg via INTRAVENOUS
  Filled 2014-12-27 (×37): qty 2

## 2014-12-27 MED ORDER — SODIUM CHLORIDE 0.9 % IV SOLN
INTRAVENOUS | Status: DC
  Administered 2014-12-27 – 2014-12-28 (×6): via INTRAVENOUS

## 2014-12-27 MED ORDER — THIAMINE HCL 100 MG/ML IJ SOLN
INTRAMUSCULAR | Status: AC
  Filled 2014-12-27: qty 2

## 2014-12-27 MED ORDER — ALBUTEROL SULFATE (2.5 MG/3ML) 0.083% IN NEBU: 2.5000 mg | INHALATION_SOLUTION | RESPIRATORY_TRACT | Status: DC | PRN

## 2014-12-27 MED ORDER — PANTOPRAZOLE SODIUM 40 MG IV SOLR
40.0000 mg | INTRAVENOUS | Status: DC
  Administered 2014-12-27 – 2014-12-30 (×4): 40 mg via INTRAVENOUS
  Filled 2014-12-27 (×4): qty 40

## 2014-12-27 NOTE — Progress Notes (Signed)
UR completed 

## 2014-12-27 NOTE — Care Management Note (Addendum)
    Page 1 of 1   12/31/2014     3:21:31 PM CARE MANAGEMENT NOTE 12/31/2014  Patient:  [name], [name]   Account Number:  [id number]  Date Initiated:  12/27/2014  Documentation initiated by:  Jolene Provost  Subjective/Objective Assessment:   Pt admitted with pancratitis. Pt is from home, lives with mother in law. Pt has neb machine but no other DME's. Pt is independent at baseline. and has no HH services.     Action/Plan:   Pt plans to discharge home with self care. No CM needs identified. Will cont to follow.   Anticipated DC Date:  12/29/2014   Anticipated DC Plan:  [place]  CM consult      [facility] Choice  HOME HEALTH   Choice offered to / List presented to:  C-1 Patient        [place] arranged  HH-1 RN      [place].   Status of service:  Completed, signed off Medicare Important Message given?   (If response is "NO", the following Medicare IM given date fields will be blank) Date Medicare IM given:   Medicare IM given by:   Date Additional Medicare IM given:   Additional Medicare IM given by:    Discharge Disposition:  [place]  Per UR Regulation:  Reviewed for med. necessity/level of care/duration of stay  If discussed at [place] of Stay Meetings, dates discussed:    Comments:  12/31/2014 [place], RN, MSN, CM Discharge anticipated over weekend. Pt will need tube feeding 5-6 weeks. [facility], per pt's choice, notified of referral and will obtain pt information from chart. Instructions left for RN on how to complice [facility] referral. Will cont to follow if pt not discharged home over weekend.  12/27/2014 [place], RN, MSN, CM

## 2014-12-27 NOTE — Consult Note (Signed)
Referring Provider: Dr. Cindie Laroche Primary Care Physician:  Maricela Curet, MD Primary Gastroenterologist:  Dr. Gala Romney   Date of Admission: 12/26/14 Date of Consultation: 12/27/14  Reason for Consultation:  Acute on chronic pancreatitis, pseudocyst  HPI:  [name] is a 39 y.o. year old male with a history of chronic pancreatitis likely secondary to ETOH abuse, presenting with worsening epigastric pain. Symptoms for 4-5 days, worsening in intensity. Upper abdominal discomfort. Drinks about a 12 pack daily. No N/V. No weight loss or lack of appetite. Chronic GERD symptoms. No dysphagia. OTC reflux medication taken daily, but he does not recall the name. Unable to afford Prilosec. No lower GI symptoms. CT findings of 2X2 cm pseudocyst with mild inflammation and calcifications suggestive of acute on chronic pancreatitis. Lipase normal on admission.   Past Medical History  Diagnosis Date  . Pancreatitis   . Seizures   . Back pain   . ETOH abuse   . COPD (chronic obstructive pulmonary disease)   . SAH (subarachnoid hemorrhage)   . Macrocytosis 08/17/2011  . Bradycardia 08/17/2011  . Pneumonia   . GERD (gastroesophageal reflux disease)     Past Surgical History  Procedure Laterality Date  . Back surgery    . Septoplasty      Prior to Admission medications   Medication Sig Start Date End Date Taking? Authorizing Provider  albuterol (PROVENTIL HFA;VENTOLIN HFA) 108 (90 BASE) MCG/ACT inhaler Inhale 2 puffs into the lungs every 6 (six) hours as needed for wheezing or shortness of breath.   Yes Historical Provider, MD  albuterol (PROVENTIL) (2.5 MG/3ML) 0.083% nebulizer solution Take 3 mLs (2.5 mg total) by nebulization every 4 (four) hours as needed for wheezing or shortness of breath. 09/09/13  Yes Orpah Greek, MD  carbamazepine (TEGRETOL) 200 MG tablet Take 400 mg by mouth 2 (two) times daily.   Yes Historical Provider, MD  Fluticasone-Salmeterol (ADVAIR)  250-50 MCG/DOSE AEPB Inhale 1 puff into the lungs 2 (two) times daily.   Yes Historical Provider, MD  levETIRAcetam (KEPPRA) 500 MG tablet Take 500 mg by mouth 2 (two) times daily.    Yes Historical Provider, MD  oxyCODONE-acetaminophen (PERCOCET) 10-325 MG per tablet Take 1 tablet by mouth 4 (four) times daily.    Yes Historical Provider, MD    Current Facility-Administered Medications  Medication Dose Route Frequency Provider Last Rate Last Dose  . 0.9 %  sodium chloride infusion   Intravenous Continuous Fredia Sorrow, MD 100 mL/hr at 12/26/14 2007    . 0.9 %  sodium chloride infusion   Intravenous STAT Fredia Sorrow, MD      . 0.9 %  sodium chloride infusion   Intravenous Continuous Jani Gravel, MD 75 mL/hr at 12/27/14 1447    . acetaminophen (TYLENOL) tablet 650 mg  650 mg Oral Q6H PRN Jani Gravel, MD       Or  . acetaminophen (TYLENOL) suppository 650 mg  650 mg Rectal Q6H PRN Jani Gravel, MD      . albuterol (PROVENTIL) (2.5 MG/3ML) 0.083% nebulizer solution 2.5 mg  2.5 mg Nebulization Q4H PRN Jani Gravel, MD      . carbamazepine (TEGRETOL) tablet 400 mg  400 mg Oral BID Jani Gravel, MD   400 mg at 12/27/14 0819  . diazepam (VALIUM) injection 5-10 mg  5-10 mg Intravenous Q1H PRN Jani Gravel, MD   5 mg at 12/27/14 0125  . enoxaparin (LOVENOX) injection 40 mg  40 mg Subcutaneous Q24H Jeneen Rinks  Maudie Mercury, MD   40 mg at 12/27/14 5176  . HYDROmorphone (DILAUDID) injection 2 mg  2 mg Intravenous Q3H PRN Jani Gravel, MD   2 mg at 12/27/14 1245  . levETIRAcetam (KEPPRA) tablet 500 mg  500 mg Oral BID Jani Gravel, MD   500 mg at 12/27/14 0819  . mometasone-formoterol (DULERA) 100-5 MCG/ACT inhaler 2 puff  2 puff Inhalation BID Jani Gravel, MD   2 puff at 12/26/14 2315  . nicotine (NICODERM CQ - dosed in mg/24 hours) patch 21 mg  21 mg Transdermal Daily Jani Gravel, MD   21 mg at 12/27/14 0820  . oxyCODONE-acetaminophen (PERCOCET/ROXICET) 5-325 MG per tablet   Oral QID Jani Gravel, MD   1 tablet at 12/27/14 1438  . sodium  chloride 0.9 % injection 3 mL  3 mL Intravenous Q12H Jani Gravel, MD   3 mL at 12/27/14 0819    Allergies as of 12/26/2014  . (No Known Allergies)    Family History  Problem Relation Age of Onset  . Seizures Father   . Alcohol abuse Father   . Coronary artery disease Mother     deceased age 106  . Colon cancer Neg Hx     History   Social History  . Marital Status: Single    Spouse Name: N/A  . Number of Children: N/A  . Years of Education: N/A   Occupational History  . Not on file.   Social History Main Topics  . Smoking status: Current Every Day Smoker -- 1.00 packs/day for 20 years    Types: Cigarettes  . Smokeless tobacco: Never Used  . Alcohol Use: 25.2 oz/week    42 Cans of beer per week     Comment: daily, at least 12 beers a day   . Drug Use: Yes    Special: Marijuana     Comment: history of marijuana use   . Sexual Activity: Not Currently   Other Topics Concern  . Not on file   Social History Narrative    Review of Systems: Negative unless mentioned in HPI.   Physical Exam: Vital signs in last 24 hours: Temp:  [97.6 F (36.4 C)-98.1 F (36.7 C)] 97.8 F (36.6 C) (02/29 1330) Pulse Rate:  [60-78] 61 (02/29 1330) Resp:  [18-20] 20 (02/29 1330) BP: (137-195)/(70-96) 164/92 mmHg (02/29 1330) SpO2:  [94 %-100 %] 98 % (02/29 1330) Weight:  [132 lb (59.875 kg)] 132 lb (59.875 kg) (02/28 1702) Last BM Date: 12/26/14 General:   Alert and oriented, appears older than stated age, somewhat anxious. Head:  Normocephalic and atraumatic. Eyes:  Sclera clear, no icterus.   Conjunctiva pink. Ears:  Normal auditory acuity. Nose:  No deformity, discharge,  or lesions. Mouth:  No deformity or lesions, dentition normal. Lungs:  Coarse wheezing bilaterally Heart:  S1 S2 present without murmurs Abdomen:  Soft, TTP upper abdomen and nondistended. No masses, hepatosplenomegaly or hernias noted. Normal bowel sounds, without guarding, and without rebound.   Rectal:   Deferred   Msk:  Symmetrical without gross deformities. Normal posture. Extremities:  Without  edema. Neurologic:  Alert and  oriented x4;  grossly normal neurologically. Skin:  Intact without significant lesions or rashes. Cervical Nodes:  No significant cervical adenopathy. Psych:  Alert and cooperative.   Intake/Output from previous day:   Intake/Output this shift: Total I/O In: 483 [P.O.:480; I.V.:3] Out: -   Lab Results:  Recent Labs  12/26/14 1811 12/27/14 0556  WBC 13.0* 8.3  HGB 14.9  14.3  HCT 42.6 41.9  PLT 279 275   BMET  Recent Labs  12/26/14 1811 12/27/14 0556 12/27/14 1433  NA 122* 134* 130*  K 3.9 4.0 3.9  CL 88* 96 100  CO2 22 25 23   GLUCOSE 93 103* 151*  BUN 5* 6 5*  CREATININE 0.35* 0.40* 0.37*  CALCIUM 8.4 9.0 8.7   LFT  Recent Labs  12/26/14 1811 12/27/14 0556  PROT 8.2 7.7  ALBUMIN 4.5 4.3  AST 59* 51*  ALT 40 34  ALKPHOS 128* 122*  BILITOT 0.5 0.5    Studies/Results: Ct Abdomen Pelvis W Contrast  12/26/2014   CLINICAL DATA:  Sharp epigastric abdominal pain for 1 day. Similar symptoms in the past associated with pancreatitis. Nausea and vomiting. History of alcohol abuse.  EXAM: CT ABDOMEN AND PELVIS WITH CONTRAST  TECHNIQUE: Multidetector CT imaging of the abdomen and pelvis was performed using the standard protocol following bolus administration of intravenous contrast.  CONTRAST:  128mL OMNIPAQUE IOHEXOL 300 MG/ML SOLN, 41mL OMNIPAQUE IOHEXOL 300 MG/ML SOLN  COMPARISON:  Acute abdominal series December 26, 2013 at [place] [facility] hours and CT of the abdomen and pelvis October 20, 2013  FINDINGS: LUNG BASES: Included view of the lung bases are clear. Visualized heart and pericardium are unremarkable.  SOLID ORGANS: 22 x 22 mm cystic mass in pancreatic head associated with coarse calcifications, new from prior imaging. Mild pancreatic atrophy distal to this. No ductal dilatation. Faint inflammatory changes and small reactive lymph nodes about the  pancreatic head. No focal areas of pancreatic hypoenhancement. The spleen, gallbladder, and adrenal glands are unremarkable. Mild focal fatty infiltration about the falciform ligament, the liver is otherwise unremarkable.  GASTROINTESTINAL TRACT: Mildly prominent small bowel in LEFT upper quadrant. The stomach, and large bowel are normal in course and caliber without inflammatory changes. Normal appendix.  KIDNEYS/ URINARY TRACT: Kidneys are orthotopic, demonstrating symmetric enhancement. No nephrolithiasis, hydronephrosis or solid renal masses. Too small to characterize hypodensity lower pole of LEFT kidney. The unopacified ureters are normal in course and caliber. Urinary bladder is partially distended and unremarkable.  PERITONEUM/RETROPERITONEUM: Aortoiliac vessels are normal in course and caliber, moderate calcific atherosclerosis, advanced for age. Scratch No lymphadenopathy by CT size criteria. The prostate is mildly enlarged, similar. Prominent appearance of the seminal vesicles without inflammation, similar. No intraperitoneal free fluid nor free air.  SOFT TISSUE/OSSEOUS STRUCTURES: Large broad-based disc osteophyte complexes at L4-5 and L5-S1 resultant moderate to severe canal stenosis at L4-5, at least mild at L5-S1, and moderate to severe L5-S1 neural foraminal narrowing.  IMPRESSION: 22 x 22 mm pseudocyst and pancreatic head with mild inflammation an calcifications suggesting acute on chronic pancreatitis without focal necrosis.  Mildly prominent small bowel may be seen with ileus, no bowel obstruction.  Mildly enlarged prostate and seminal vesicles could reflect sequelae of prostatitis/seminal vesiculitis without discrete abscess.  Advanced atherosclerosis for age.  Next   Electronically Signed   By: Elon Alas   On: 12/26/2014 21:13   Dg Abd Acute W/chest  12/26/2014   CLINICAL DATA:  Epigastric pain x5 days  EXAM: ACUTE ABDOMEN SERIES (ABDOMEN 2 VIEW & CHEST 1 VIEW)  COMPARISON:  Chest  radiographs dated 03/03/2014  FINDINGS: Chronic interstitial markings/emphysematous changes. Mild scarring in the right upper lobe and left lung apex. No focal consolidation. No pleural effusion or pneumothorax.  The heart is normal in size.  Mildly prominent loops of small bowel in the left mid abdomen, possibly reflecting partial small bowel obstruction or  small bowel ileus.  No evidence of free air under the diaphragm on the upright view.  IMPRESSION: No evidence of acute cardiopulmonary disease.  Mildly prominent loops of small bowel in the left mid abdomen, possibly reflecting partial small bowel obstruction or small bowel ileus.   Electronically Signed   By: Julian Hy M.D.   On: 12/26/2014 19:19    Impression: 39 year old male admitted with acute on chronic pancreatitis and evidence of small pseudocyst formation on CT imaging in the setting of alcohol abuse. Although AAS and CT question mildly prominent small bowel, clinical findings are not consistent with an ileus. Needs aggressive IV hydration, pain control, and strict NPO status. Discussed with patient at length the rationale behind bowel rest; he continued to ask if he may drink despite the discussion. Concern for non-compliance. Will make NPO for now and reassess in the morning. Further abdominal imaging to be performed in future if no improvement in symptoms.   Plan: NPO. May have ice chips sparingly  Increase IV fluids to 300 cc/hour until tomorrow morning, then reassess Increase Dilaudid to 2 mg every 2 hours prn for short-term.  ETOH cessation and abstinence discussed with patient Add PPI due to chronic GERD Further abdominal imaging in the future as clinically indicated Will reassess in the morning.   Orvil Feil, ANP-BC [facility]     LOS: 0 days    12/27/2014, 4:33 PM  Attending Note:  Patient seen and examined; CT reviewed.  Agree with above assessment and recommendations

## 2014-12-27 NOTE — Progress Notes (Signed)
Patient with chronic pancreatitis I cath all ingestion yesterday noted to have new pseudocyst formation in her pancreas 22 mm in diameter complains of chronic pain opioid seeking patient with severe COPD and bullous emphysema [name] PZW:[id number] DOB: [birth year] DOA: 12/26/2014 PCP: Maricela Curet, MD             Physical Exam: Blood pressure 137/70, pulse 65, temperature 97.6 F (36.4 C), temperature source Oral, resp. rate 20, height 5\' 9"  (1.753 m), weight 132 lb (59.875 kg), SpO2 100 %.lungs show prolonged his return x-ray phase mild end expiratory wheeze scattered rhonchi no rales appreciable heart regular rhythm no*for measles rubs abdomen mild epigastric tenderness mild wall the regarding no rebound no tenderness no no rebound tenderness noticed no bowel sounds normoactive no peristaltic rushes   Investigations:  No results found for this or any previous visit (from the past 240 hour(s)).   Basic Metabolic Panel:  Recent Labs  12/26/14 1811 12/27/14 0556  NA 122* 134*  K 3.9 4.0  CL 88* 96  CO2 22 25  GLUCOSE 93 103*  BUN 5* 6  CREATININE 0.35* 0.40*  CALCIUM 8.4 9.0   Liver Function Tests:  Recent Labs  12/26/14 1811 12/27/14 0556  AST 59* 51*  ALT 40 34  ALKPHOS 128* 122*  BILITOT 0.5 0.5  PROT 8.2 7.7  ALBUMIN 4.5 4.3     CBC:  Recent Labs  12/26/14 1811 12/27/14 0556  WBC 13.0* 8.3  NEUTROABS 9.1*  --   HGB 14.9 14.3  HCT 42.6 41.9  MCV 99.3 102.2*  PLT 279 275    Ct Abdomen Pelvis W Contrast  12/26/2014   CLINICAL DATA:  Sharp epigastric abdominal pain for 1 day. Similar symptoms in the past associated with pancreatitis. Nausea and vomiting. History of alcohol abuse.  EXAM: CT ABDOMEN AND PELVIS WITH CONTRAST  TECHNIQUE: Multidetector CT imaging of the abdomen and pelvis was performed using the standard protocol following bolus administration of intravenous contrast.  CONTRAST:  175mL OMNIPAQUE IOHEXOL 300 MG/ML SOLN, 40mL  OMNIPAQUE IOHEXOL 300 MG/ML SOLN  COMPARISON:  Acute abdominal series December 26, 2013 at [place] hours and CT of the abdomen and pelvis October 20, 2013  FINDINGS: LUNG BASES: Included view of the lung bases are clear. Visualized heart and pericardium are unremarkable.  SOLID ORGANS: 22 x 22 mm cystic mass in pancreatic head associated with coarse calcifications, new from prior imaging. Mild pancreatic atrophy distal to this. No ductal dilatation. Faint inflammatory changes and small reactive lymph nodes about the pancreatic head. No focal areas of pancreatic hypoenhancement. The spleen, gallbladder, and adrenal glands are unremarkable. Mild focal fatty infiltration about the falciform ligament, the liver is otherwise unremarkable.  GASTROINTESTINAL TRACT: Mildly prominent small bowel in LEFT upper quadrant. The stomach, and large bowel are normal in course and caliber without inflammatory changes. Normal appendix.  KIDNEYS/ URINARY TRACT: Kidneys are orthotopic, demonstrating symmetric enhancement. No nephrolithiasis, hydronephrosis or solid renal masses. Too small to characterize hypodensity lower pole of LEFT kidney. The unopacified ureters are normal in course and caliber. Urinary bladder is partially distended and unremarkable.  PERITONEUM/RETROPERITONEUM: Aortoiliac vessels are normal in course and caliber, moderate calcific atherosclerosis, advanced for age. Scratch No lymphadenopathy by CT size criteria. The prostate is mildly enlarged, similar. Prominent appearance of the seminal vesicles without inflammation, similar. No intraperitoneal free fluid nor free air.  SOFT TISSUE/OSSEOUS STRUCTURES: Large broad-based disc osteophyte complexes at L4-5 and L5-S1 resultant moderate to severe canal stenosis at  L4-5, at least mild at L5-S1, and moderate to severe L5-S1 neural foraminal narrowing.  IMPRESSION: 22 x 22 mm pseudocyst and pancreatic head with mild inflammation an calcifications suggesting acute on  chronic pancreatitis without focal necrosis.  Mildly prominent small bowel may be seen with ileus, no bowel obstruction.  Mildly enlarged prostate and seminal vesicles could reflect sequelae of prostatitis/seminal vesiculitis without discrete abscess.  Advanced atherosclerosis for age.  Next   Electronically Signed   By: Elon Alas   On: 12/26/2014 21:13   Dg Abd Acute W/chest  12/26/2014   CLINICAL DATA:  Epigastric pain x5 days  EXAM: ACUTE ABDOMEN SERIES (ABDOMEN 2 VIEW & CHEST 1 VIEW)  COMPARISON:  Chest radiographs dated 03/03/2014  FINDINGS: Chronic interstitial markings/emphysematous changes. Mild scarring in the right upper lobe and left lung apex. No focal consolidation. No pleural effusion or pneumothorax.  The heart is normal in size.  Mildly prominent loops of small bowel in the left mid abdomen, possibly reflecting partial small bowel obstruction or small bowel ileus.  No evidence of free air under the diaphragm on the upright view.  IMPRESSION: No evidence of acute cardiopulmonary disease.  Mildly prominent loops of small bowel in the left mid abdomen, possibly reflecting partial small bowel obstruction or small bowel ileus.   Electronically Signed   By: Julian Hy M.D.   On: 12/26/2014 19:19      Medications:   Impression: Severe COPD with bullous emphysema  Active Problems:   Pancreatitis   Pancreatic pseudocyst   Hyponatremia     Plan:Observe patient on clear liquids monitor lipase serially and abdominalsymptomatology  Consultants: Gastroenterology [place]   Procedures   Antibiotics:                   Code Status:full  Family Communication:    Disposition Plan observe serial lipases and abdominal symptomatology with clear liquid diet gastroenterology consult requested  Time spent:30 minutes   LOS: 0 days   Onyekachi Gathright M   12/27/2014, 1:11 PM

## 2014-12-28 DIAGNOSIS — R1013 Epigastric pain: Secondary | ICD-10-CM

## 2014-12-28 LAB — BASIC METABOLIC PANEL
Anion gap: 7 (ref 5–15)
BUN: 4 mg/dL — ABNORMAL LOW (ref 6–23)
CO2: 20 mmol/L (ref 19–32)
Calcium: 8.3 mg/dL — ABNORMAL LOW (ref 8.4–10.5)
Chloride: 107 mmol/L (ref 96–112)
Creatinine, Ser: 0.36 mg/dL — ABNORMAL LOW (ref 0.50–1.35)
GFR calc Af Amer: >90 mL/min (ref >90)
GFR calc non Af Amer: >90 mL/min (ref >90)
GLUCOSE: 87 mg/dL (ref 70–99)
Potassium: 4.2 mmol/L (ref 3.5–5.1)
SODIUM: 134 mmol/L — AB (ref 135–145)

## 2014-12-28 LAB — CBC WITH DIFFERENTIAL/PLATELET
Basophils Absolute: 0 10*3/uL (ref 0.0–0.1)
Basophils Relative: 0 % (ref 0–1)
EOS PCT: 2 % (ref 0–5)
Eosinophils Absolute: 0.1 10*3/uL (ref 0.0–0.7)
HCT: 38.4 % — ABNORMAL LOW (ref 39.0–52.0)
HEMOGLOBIN: 12.6 g/dL — AB (ref 13.0–17.0)
LYMPHS ABS: 2.1 10*3/uL (ref 0.7–4.0)
LYMPHS PCT: 28 % (ref 12–46)
MCH: 34.1 pg — AB (ref 26.0–34.0)
MCHC: 32.8 g/dL (ref 30.0–36.0)
MCV: 103.8 fL — ABNORMAL HIGH (ref 78.0–100.0)
MONOS PCT: 12 % (ref 3–12)
Monocytes Absolute: 0.9 10*3/uL (ref 0.1–1.0)
NEUTROS ABS: 4.4 10*3/uL (ref 1.7–7.7)
Neutrophils Relative %: 58 % (ref 43–77)
PLATELETS: 241 10*3/uL (ref 150–400)
RBC: 3.7 MIL/uL — AB (ref 4.22–5.81)
RDW: 12.4 % (ref 11.5–15.5)
WBC: 7.6 10*3/uL (ref 4.0–10.5)

## 2014-12-28 LAB — LIPASE, BLOOD: Lipase: 66 U/L — ABNORMAL HIGH (ref 11–59)

## 2014-12-28 MED ORDER — SODIUM CHLORIDE 0.9 % IV SOLN
INTRAVENOUS | Status: DC
  Administered 2014-12-28 – 2014-12-31 (×10): via INTRAVENOUS

## 2014-12-28 NOTE — Progress Notes (Signed)
Lab Results  Component Value Date   CREATININE 0.36* 12/28/2014   BUN 4* 12/28/2014   NA 134* 12/28/2014   K 4.2 12/28/2014   CL 107 12/28/2014   CO2 20 12/28/2014   Labs reviewed. IV fluids decreased to 184ml/h.   Orvil Feil, ANP-BC [facility]

## 2014-12-28 NOTE — Progress Notes (Signed)
Patient currently nothing by mouth as per GI some mild epigastric discomfort normal lipase today at [place] DOB: [birth year] DOA: 12/26/2014 PCP: Maricela Curet, MD             Physical Exam: Blood pressure 120/59, pulse 74, temperature 97.6 F (36.4 C), temperature source Oral, resp. rate 20, height 5\' 9"  (1.753 m), weight 127 lb 3.3 oz (57.7 kg), SpO2 98 %. lungs show prolonged his return x-ray phase scattered rhonchi mild end expiratory wheeze no rales audible heart regular rhythm no S3-S4 no heaves thrills rubs abdomen mildly tender epigastrium bowel sounds normoactive no peristaltic rushes no guarding or rebound elicited   Investigations:  No results found for this or any previous visit (from the past 240 hour(s)).   Basic Metabolic Panel:  Recent Labs  12/27/14 0556 12/27/14 1433  NA 134* 130*  K 4.0 3.9  CL 96 100  CO2 25 23  GLUCOSE 103* 151*  BUN 6 5*  CREATININE 0.40* 0.37*  CALCIUM 9.0 8.7   Liver Function Tests:  Recent Labs  12/26/14 1811 12/27/14 0556  AST 59* 51*  ALT 40 34  ALKPHOS 128* 122*  BILITOT 0.5 0.5  PROT 8.2 7.7  ALBUMIN 4.5 4.3     CBC:  Recent Labs  12/26/14 1811 12/27/14 0556 12/28/14 0546  WBC 13.0* 8.3 7.6  NEUTROABS 9.1*  --  4.4  HGB 14.9 14.3 12.6*  HCT 42.6 41.9 38.4*  MCV 99.3 102.2* 103.8*  PLT 279 275 241    Ct Abdomen Pelvis W Contrast  12/26/2014   CLINICAL DATA:  Sharp epigastric abdominal pain for 1 day. Similar symptoms in the past associated with pancreatitis. Nausea and vomiting. History of alcohol abuse.  EXAM: CT ABDOMEN AND PELVIS WITH CONTRAST  TECHNIQUE: Multidetector CT imaging of the abdomen and pelvis was performed using the standard protocol following bolus administration of intravenous contrast.  CONTRAST:  162mL OMNIPAQUE IOHEXOL 300 MG/ML SOLN, 60mL OMNIPAQUE IOHEXOL 300 MG/ML SOLN  COMPARISON:  Acute abdominal series December 26, 2013 at [place] hours and CT of the  abdomen and pelvis October 20, 2013  FINDINGS: LUNG BASES: Included view of the lung bases are clear. Visualized heart and pericardium are unremarkable.  SOLID ORGANS: 22 x 22 mm cystic mass in pancreatic head associated with coarse calcifications, new from prior imaging. Mild pancreatic atrophy distal to this. No ductal dilatation. Faint inflammatory changes and small reactive lymph nodes about the pancreatic head. No focal areas of pancreatic hypoenhancement. The spleen, gallbladder, and adrenal glands are unremarkable. Mild focal fatty infiltration about the falciform ligament, the liver is otherwise unremarkable.  GASTROINTESTINAL TRACT: Mildly prominent small bowel in LEFT upper quadrant. The stomach, and large bowel are normal in course and caliber without inflammatory changes. Normal appendix.  KIDNEYS/ URINARY TRACT: Kidneys are orthotopic, demonstrating symmetric enhancement. No nephrolithiasis, hydronephrosis or solid renal masses. Too small to characterize hypodensity lower pole of LEFT kidney. The unopacified ureters are normal in course and caliber. Urinary bladder is partially distended and unremarkable.  PERITONEUM/RETROPERITONEUM: Aortoiliac vessels are normal in course and caliber, moderate calcific atherosclerosis, advanced for age. Scratch No lymphadenopathy by CT size criteria. The prostate is mildly enlarged, similar. Prominent appearance of the seminal vesicles without inflammation, similar. No intraperitoneal free fluid nor free air.  SOFT TISSUE/OSSEOUS STRUCTURES: Large broad-based disc osteophyte complexes at L4-5 and L5-S1 resultant moderate to severe canal stenosis at L4-5, at least mild at L5-S1, and moderate to severe L5-S1 neural foraminal  narrowing.  IMPRESSION: 22 x 22 mm pseudocyst and pancreatic head with mild inflammation an calcifications suggesting acute on chronic pancreatitis without focal necrosis.  Mildly prominent small bowel may be seen with ileus, no bowel obstruction.   Mildly enlarged prostate and seminal vesicles could reflect sequelae of prostatitis/seminal vesiculitis without discrete abscess.  Advanced atherosclerosis for age.  Next   Electronically Signed   By: Elon Alas   On: 12/26/2014 21:13   Dg Abd Acute W/chest  12/26/2014   CLINICAL DATA:  Epigastric pain x5 days  EXAM: ACUTE ABDOMEN SERIES (ABDOMEN 2 VIEW & CHEST 1 VIEW)  COMPARISON:  Chest radiographs dated 03/03/2014  FINDINGS: Chronic interstitial markings/emphysematous changes. Mild scarring in the right upper lobe and left lung apex. No focal consolidation. No pleural effusion or pneumothorax.  The heart is normal in size.  Mildly prominent loops of small bowel in the left mid abdomen, possibly reflecting partial small bowel obstruction or small bowel ileus.  No evidence of free air under the diaphragm on the upright view.  IMPRESSION: No evidence of acute cardiopulmonary disease.  Mildly prominent loops of small bowel in the left mid abdomen, possibly reflecting partial small bowel obstruction or small bowel ileus.   Electronically Signed   By: Julian Hy M.D.   On: 12/26/2014 19:19      Medications:   Impression: Ethanol abuse  Active Problems:   Pancreatitis   Pancreatic pseudocyst   Hyponatremia   Acute pancreatitis     Plan: Nothing by mouth ice chips only serial B med daily lipase daily monitor clinical symptomatology ethanol withdrawal protocol   Consultants: Gastroenterology   Procedures   Antibiotics:                   Code Status: Full   Family Communication:    Disposition Plan continue bowel rest and nothing by mouth with only ice chips opioid analgesia intravenously final withdrawal protocol monitor serial lipases and bmet  Time spent: 30 minutes   LOS: 1 day   Calina Patrie M   12/28/2014, 12:36 PM

## 2014-12-28 NOTE — Progress Notes (Addendum)
Subjective: Continues to report pain but with very mild improvement since admission. No N/V. Dilaudid 2 mg IV every 2 hour dosing. Continues to ask for food.   Objective: Vital signs in last 24 hours: Temp:  [97.6 F (36.4 C)-97.8 F (36.6 C)] 97.6 F (36.4 C) (03/01 0609) Pulse Rate:  [61-80] 74 (03/01 0609) Resp:  [20] 20 (03/01 0609) BP: (120-164)/(59-92) 120/59 mmHg (03/01 0609) SpO2:  [95 %-99 %] 95 % (03/01 0703) Weight:  [127 lb 3.3 oz (57.7 kg)] 127 lb 3.3 oz (57.7 kg) (03/01 0609) Last BM Date: 12/27/14 General:   Alert and oriented, pleasant Head:  Normocephalic and atraumatic. Eyes:  No icterus, sclera clear. Conjuctiva pink.  Abdomen:  Bowel sounds present, soft, TTP upper abdomen, no rebound or guarding Extremities:  Without edema. Neurologic:  Alert and  oriented x4 Psych:  Alert and cooperative. Normal mood and affect.  Intake/Output from previous day: 02/29 0701 - 03/01 0700 In: 873 [P.O.:840; I.V.:33] Out: -  Intake/Output this shift:    Lab Results:  Recent Labs  12/26/14 1811 12/27/14 0556 12/28/14 0546  WBC 13.0* 8.3 7.6  HGB 14.9 14.3 12.6*  HCT 42.6 41.9 38.4*  PLT 279 275 241   BMET  Recent Labs  12/26/14 1811 12/27/14 0556 12/27/14 1433  NA 122* 134* 130*  K 3.9 4.0 3.9  CL 88* 96 100  CO2 22 25 23   GLUCOSE 93 103* 151*  BUN 5* 6 5*  CREATININE 0.35* 0.40* 0.37*  CALCIUM 8.4 9.0 8.7   LFT  Recent Labs  12/26/14 1811 12/27/14 0556  PROT 8.2 7.7  ALBUMIN 4.5 4.3  AST 59* 51*  ALT 40 34  ALKPHOS 128* 122*  BILITOT 0.5 0.5     Studies/Results: Ct Abdomen Pelvis W Contrast  12/26/2014   CLINICAL DATA:  Sharp epigastric abdominal pain for 1 day. Similar symptoms in the past associated with pancreatitis. Nausea and vomiting. History of alcohol abuse.  EXAM: CT ABDOMEN AND PELVIS WITH CONTRAST  TECHNIQUE: Multidetector CT imaging of the abdomen and pelvis was performed using the standard protocol following bolus  administration of intravenous contrast.  CONTRAST:  110mL OMNIPAQUE IOHEXOL 300 MG/ML SOLN, 46mL OMNIPAQUE IOHEXOL 300 MG/ML SOLN  COMPARISON:  Acute abdominal series December 26, 2013 at [place] hours and CT of the abdomen and pelvis October 20, 2013  FINDINGS: LUNG BASES: Included view of the lung bases are clear. Visualized heart and pericardium are unremarkable.  SOLID ORGANS: 22 x 22 mm cystic mass in pancreatic head associated with coarse calcifications, new from prior imaging. Mild pancreatic atrophy distal to this. No ductal dilatation. Faint inflammatory changes and small reactive lymph nodes about the pancreatic head. No focal areas of pancreatic hypoenhancement. The spleen, gallbladder, and adrenal glands are unremarkable. Mild focal fatty infiltration about the falciform ligament, the liver is otherwise unremarkable.  GASTROINTESTINAL TRACT: Mildly prominent small bowel in LEFT upper quadrant. The stomach, and large bowel are normal in course and caliber without inflammatory changes. Normal appendix.  KIDNEYS/ URINARY TRACT: Kidneys are orthotopic, demonstrating symmetric enhancement. No nephrolithiasis, hydronephrosis or solid renal masses. Too small to characterize hypodensity lower pole of LEFT kidney. The unopacified ureters are normal in course and caliber. Urinary bladder is partially distended and unremarkable.  PERITONEUM/RETROPERITONEUM: Aortoiliac vessels are normal in course and caliber, moderate calcific atherosclerosis, advanced for age. Scratch No lymphadenopathy by CT size criteria. The prostate is mildly enlarged, similar. Prominent appearance of the seminal vesicles without inflammation, similar. No  intraperitoneal free fluid nor free air.  SOFT TISSUE/OSSEOUS STRUCTURES: Large broad-based disc osteophyte complexes at L4-5 and L5-S1 resultant moderate to severe canal stenosis at L4-5, at least mild at L5-S1, and moderate to severe L5-S1 neural foraminal narrowing.  IMPRESSION: 22 x 22 mm  pseudocyst and pancreatic head with mild inflammation an calcifications suggesting acute on chronic pancreatitis without focal necrosis.  Mildly prominent small bowel may be seen with ileus, no bowel obstruction.  Mildly enlarged prostate and seminal vesicles could reflect sequelae of prostatitis/seminal vesiculitis without discrete abscess.  Advanced atherosclerosis for age.  Next   Electronically Signed   By: Elon Alas   On: 12/26/2014 21:13   Dg Abd Acute W/chest  12/26/2014   CLINICAL DATA:  Epigastric pain x5 days  EXAM: ACUTE ABDOMEN SERIES (ABDOMEN 2 VIEW & CHEST 1 VIEW)  COMPARISON:  Chest radiographs dated 03/03/2014  FINDINGS: Chronic interstitial markings/emphysematous changes. Mild scarring in the right upper lobe and left lung apex. No focal consolidation. No pleural effusion or pneumothorax.  The heart is normal in size.  Mildly prominent loops of small bowel in the left mid abdomen, possibly reflecting partial small bowel obstruction or small bowel ileus.  No evidence of free air under the diaphragm on the upright view.  IMPRESSION: No evidence of acute cardiopulmonary disease.  Mildly prominent loops of small bowel in the left mid abdomen, possibly reflecting partial small bowel obstruction or small bowel ileus.   Electronically Signed   By: Julian Hy M.D.   On: 12/26/2014 19:19    Assessment: 39 year old male admitted with acute on chronic pancreatitis and evidence of small pseudocyst formation on CT imaging in the setting of alcohol abuse. Although AAS and CT question mildly prominent small bowel, clinical findings are not consistent with an ileus. Mild improvement since admission but still requiring dilaudid every 2 hours. Would keep NPO except ice chips for now and continue with IV hydration. Need strict I/Os (no urine output recorded, although patient states he has been urinating).    Plan: Remain NPO Continue IV fluids at 300 cc/hour with likely decrease to 150  cc/hour this afternoon Dilaudid 2 mg IV every 2 hours ETOH cessation and abstinence PPI May ultimately need repeat imaging in 6-8 weeks depending on clinical course STRICT I/Os. Need to record urine output. Discussed with Margreta Journey, RN.    Orvil Feil, ANP-BC [facility]    LOS: 1 day    12/28/2014, 8:48 AM  Attending note. Patient seen and examined. Agree with above assessment and recommendations.

## 2014-12-29 DIAGNOSIS — K860 Alcohol-induced chronic pancreatitis: Secondary | ICD-10-CM | POA: Insufficient documentation

## 2014-12-29 DIAGNOSIS — K852 Alcohol induced acute pancreatitis without necrosis or infection: Secondary | ICD-10-CM | POA: Insufficient documentation

## 2014-12-29 LAB — BASIC METABOLIC PANEL
Anion gap: 12 (ref 5–15)
BUN: <5 mg/dL — ABNORMAL LOW (ref 6–23)
CHLORIDE: 100 mmol/L (ref 96–112)
CO2: 24 mmol/L (ref 19–32)
Calcium: 9.1 mg/dL (ref 8.4–10.5)
Creatinine, Ser: 0.4 mg/dL — ABNORMAL LOW (ref 0.50–1.35)
GFR calc Af Amer: >90 mL/min (ref >90)
Glucose, Bld: 78 mg/dL (ref 70–99)
POTASSIUM: 3.8 mmol/L (ref 3.5–5.1)
SODIUM: 136 mmol/L (ref 135–145)

## 2014-12-29 LAB — LIPASE, BLOOD: Lipase: 64 U/L — ABNORMAL HIGH (ref 11–59)

## 2014-12-29 LAB — OSMOLALITY, URINE: Osmolality, Ur: 205 mOsm/kg — ABNORMAL LOW (ref 390–1090)

## 2014-12-29 NOTE — Progress Notes (Addendum)
Discussed placement of nasojejunal feeding tube with Baxter Flattery in [place]. Referred to this department by interventional radiology, Jannifer Franklin, PA-C.  Patient will be scheduled for procedure for tomorrow. We will hold Lovenox in the morning in preparation.  Procedure will take place at [facility]. Appropriate tube to be sent.

## 2014-12-29 NOTE — Progress Notes (Signed)
Patient's multiple somatic complaints of epigastric discomfort not great upon palpation. No nausea vomiting await this mornings lipase [name] [name]:[id number] DOB: 09/10/1976 DOA: 12/26/2014 PCP: Maricela Curet, MD             Physical Exam: Blood pressure 156/78, pulse 73, temperature 97.8 F (36.6 C), temperature source Oral, resp. rate 20, height 5\' 9"  (1.753 m), weight 127 lb 3.3 oz (57.7 kg), SpO2 97 %. lungs show prolonged his return x-ray phase scattered rhonchi mild end expiratory wheeze no rales appreciable heart regular regular rhythm no S3 or S4 no heaves or rubs abdomen soft nontender bowel sounds normoactive no guarding or rebound no significant epigastric tenderness no peristaltic rushes   Investigations:  No results found for this or any previous visit (from the past 240 hour(s)).   Basic Metabolic Panel:  Recent Labs  12/27/14 1433 12/28/14 0546  NA 130* 134*  K 3.9 4.2  CL 100 107  CO2 23 20  GLUCOSE 151* 87  BUN 5* 4*  CREATININE 0.37* 0.36*  CALCIUM 8.7 8.3*   Liver Function Tests:  Recent Labs  12/26/14 1811 12/27/14 0556  AST 59* 51*  ALT 40 34  ALKPHOS 128* 122*  BILITOT 0.5 0.5  PROT 8.2 7.7  ALBUMIN 4.5 4.3     CBC:  Recent Labs  12/26/14 1811 12/27/14 0556 12/28/14 0546  WBC 13.0* 8.3 7.6  NEUTROABS 9.1*  --  4.4  HGB 14.9 14.3 12.6*  HCT 42.6 41.9 38.4*  MCV 99.3 102.2* 103.8*  PLT 279 275 241    No results found.    Medication  Impression: Seizure disorder  Active Problems:   Pancreatitis   Pancreatic pseudocyst   Hyponatremia   Acute pancreatitis     Plan: Advance diet as per gastroenterology observe symptomatology abdominal discomfort  Consultants: Gastroenterology    Procedures   Antibiotics:                   Code Status: Full   Family Communication:  Spoke with patient at length  Disposition Plan monitor symptoms laboratory evidence of pancreatitis clinical symptomatology  and advance diet as per GI at their discretion  Time spent: 30 minutes   LOS: 2 days   [name] M   12/29/2014, 6:37 AM

## 2014-12-29 NOTE — Progress Notes (Addendum)
Subjective:  Patient reports his pain is worsening. On Percocet every 6 hours. Taking dilaudid 2mg  IV every 2 to 3 hours. No vomiting or BMs.   Objective: Vital signs in last 24 hours: Temp:  [97.5 F (36.4 C)-97.8 F (36.6 C)] 97.5 F (36.4 C) (03/02 0700) Pulse Rate:  [73-76] 76 (03/02 0700) Resp:  [18-20] 18 (03/02 0700) BP: (142-156)/(76-78) 142/76 mmHg (03/02 0700) SpO2:  [95 %-100 %] 100 % (03/02 0720) Weight:  [126 lb 15.8 oz (57.6 kg)] 126 lb 15.8 oz (57.6 kg) (03/02 0700) Last BM Date: 12/27/14 General:   Alert,  Well-developed, well-nourished, pleasant and cooperative in NAD. Appears uncomfortable. Head:  Normocephalic and atraumatic. Eyes:  Sclera clear, no icterus.  Abdomen:  Soft, moderate to severe epigastric tenderness, hypoactive BS. nondistended. No masses, hepatosplenomegaly or hernias noted. Subjective guarding, and without rebound.   Extremities:  Without clubbing, deformity or edema. Neurologic:  Alert and  oriented x4;  grossly normal neurologically. Skin:  Intact without significant lesions or rashes. Psych:  Alert and cooperative. Normal mood and affect.  Intake/Output from previous day: 03/01 0701 - 03/02 0700 In: 7677.5 [P.O.:720; I.V.:6957.5] Out: 4750 [Urine:4750] Intake/Output this shift:    Lab Results: CBC  Recent Labs  12/26/14 1811 12/27/14 0556 12/28/14 0546  WBC 13.0* 8.3 7.6  HGB 14.9 14.3 12.6*  HCT 42.6 41.9 38.4*  MCV 99.3 102.2* 103.8*  PLT 279 275 241   BMET  Recent Labs  12/27/14 1433 12/28/14 0546 12/29/14 0643  NA 130* 134* 136  K 3.9 4.2 3.8  CL 100 107 100  CO2 23 20 24   GLUCOSE 151* 87 78  BUN 5* 4* <5*  CREATININE 0.37* 0.36* 0.40*  CALCIUM 8.7 8.3* 9.1   LFTs  Recent Labs  12/26/14 1811 12/27/14 0556  BILITOT 0.5 0.5  ALKPHOS 128* 122*  AST 59* 51*  ALT 40 34  PROT 8.2 7.7  ALBUMIN 4.5 4.3    Recent Labs  12/26/14 1811 12/28/14 0546 12/29/14 0643  LIPASE 40 66* 64*   PT/INR No results  for input(s): LABPROT, INR in the last 72 hours.    Imaging Studies: Ct Abdomen Pelvis W Contrast  12/26/2014   CLINICAL DATA:  Sharp epigastric abdominal pain for 1 day. Similar symptoms in the past associated with pancreatitis. Nausea and vomiting. History of alcohol abuse.  EXAM: CT ABDOMEN AND PELVIS WITH CONTRAST  TECHNIQUE: Multidetector CT imaging of the abdomen and pelvis was performed using the standard protocol following bolus administration of intravenous contrast.  CONTRAST:  126mL OMNIPAQUE IOHEXOL 300 MG/ML SOLN, 68mL OMNIPAQUE IOHEXOL 300 MG/ML SOLN  COMPARISON:  Acute abdominal series December 26, 2013 at [place] hours and CT of the abdomen and pelvis October 20, 2013  FINDINGS: LUNG BASES: Included view of the lung bases are clear. Visualized heart and pericardium are unremarkable.  SOLID ORGANS: 22 x 22 mm cystic mass in pancreatic head associated with coarse calcifications, new from prior imaging. Mild pancreatic atrophy distal to this. No ductal dilatation. Faint inflammatory changes and small reactive lymph nodes about the pancreatic head. No focal areas of pancreatic hypoenhancement. The spleen, gallbladder, and adrenal glands are unremarkable. Mild focal fatty infiltration about the falciform ligament, the liver is otherwise unremarkable.  GASTROINTESTINAL TRACT: Mildly prominent small bowel in LEFT upper quadrant. The stomach, and large bowel are normal in course and caliber without inflammatory changes. Normal appendix.  KIDNEYS/ URINARY TRACT: Kidneys are orthotopic, demonstrating symmetric enhancement. No nephrolithiasis, hydronephrosis or solid renal masses.  Too small to characterize hypodensity lower pole of LEFT kidney. The unopacified ureters are normal in course and caliber. Urinary bladder is partially distended and unremarkable.  PERITONEUM/RETROPERITONEUM: Aortoiliac vessels are normal in course and caliber, moderate calcific atherosclerosis, advanced for age. Scratch No  lymphadenopathy by CT size criteria. The prostate is mildly enlarged, similar. Prominent appearance of the seminal vesicles without inflammation, similar. No intraperitoneal free fluid nor free air.  SOFT TISSUE/OSSEOUS STRUCTURES: Large broad-based disc osteophyte complexes at L4-5 and L5-S1 resultant moderate to severe canal stenosis at L4-5, at least mild at L5-S1, and moderate to severe L5-S1 neural foraminal narrowing.  IMPRESSION: 22 x 22 mm pseudocyst and pancreatic head with mild inflammation an calcifications suggesting acute on chronic pancreatitis without focal necrosis.  Mildly prominent small bowel may be seen with ileus, no bowel obstruction.  Mildly enlarged prostate and seminal vesicles could reflect sequelae of prostatitis/seminal vesiculitis without discrete abscess.  Advanced atherosclerosis for age.  Next   Electronically Signed   By: Elon Alas   On: 12/26/2014 21:13   Dg Abd Acute W/chest  12/26/2014   CLINICAL DATA:  Epigastric pain x5 days  EXAM: ACUTE ABDOMEN SERIES (ABDOMEN 2 VIEW & CHEST 1 VIEW)  COMPARISON:  Chest radiographs dated 03/03/2014  FINDINGS: Chronic interstitial markings/emphysematous changes. Mild scarring in the right upper lobe and left lung apex. No focal consolidation. No pleural effusion or pneumothorax.  The heart is normal in size.  Mildly prominent loops of small bowel in the left mid abdomen, possibly reflecting partial small bowel obstruction or small bowel ileus.  No evidence of free air under the diaphragm on the upright view.  IMPRESSION: No evidence of acute cardiopulmonary disease.  Mildly prominent loops of small bowel in the left mid abdomen, possibly reflecting partial small bowel obstruction or small bowel ileus.   Electronically Signed   By: Julian Hy M.D.   On: 12/26/2014 19:19  [2 weeks]   Assessment: 39 year old male admitted with acute on chronic pancreatitis and evidence of small pseudocyst formation on CT imaging in the setting  of alcohol abuse. Although AAS and CT question mildly prominent small bowel, clinical findings are not consistent with an ileus. Patient reports worsening pain from admission. Requiring frequent dialudid and scheduled oxycodone.  Mild improvement since admission but still requiring dilaudid every 2 hours. Discussed with Dr. Gala Romney. Patient would benefit from nasojejunal tube feedings as he is not progressing and oral feedings will need to be delayed for unclear period of time. Discussed at length with the patient who agrees with plan.  Plan: 1. Arrange for nasojejunal tube placement. 2. Continue supportive measures for now. 3. If he does not show improvement in abdominal pain within the next couple of days, he may require repeat CT.  Laureen Ochs. Bernarda Caffey [facility] [phone] 3/2/201610:09 AM     LOS: 2 days    Attending note:  Patient seen and examined. Patient continues to have somewhat poor insight into his disease. I agree with plans for nasojejunal feedings. I have discussed the reasoning behind this approach with the patient at length. For the time being, no need to reimage.

## 2014-12-30 ENCOUNTER — Inpatient Hospital Stay (HOSPITAL_COMMUNITY): Payer: Medicaid Other

## 2014-12-30 LAB — BASIC METABOLIC PANEL
Anion gap: 14 (ref 5–15)
BUN: <5 mg/dL — ABNORMAL LOW (ref 6–23)
CO2: 19 mmol/L (ref 19–32)
CREATININE: 0.53 mg/dL (ref 0.50–1.35)
Calcium: 8.9 mg/dL (ref 8.4–10.5)
Chloride: 100 mmol/L (ref 96–112)
Glucose, Bld: 76 mg/dL (ref 70–99)
Potassium: 3.5 mmol/L (ref 3.5–5.1)
Sodium: 133 mmol/L — ABNORMAL LOW (ref 135–145)

## 2014-12-30 LAB — LIPASE, BLOOD: Lipase: 40 U/L (ref 11–59)

## 2014-12-30 MED ORDER — IOHEXOL 300 MG/ML  SOLN
50.0000 mL | Freq: Once | INTRAMUSCULAR | Status: AC | PRN
  Administered 2014-12-30: 50 mL via ORAL

## 2014-12-30 MED ORDER — STERILE WATER FOR INJECTION IJ SOLN
INTRAMUSCULAR | Status: AC
  Filled 2014-12-30: qty 10

## 2014-12-30 MED ORDER — ENOXAPARIN SODIUM 40 MG/0.4ML ~~LOC~~ SOLN: 40.0000 mg | SUBCUTANEOUS | Status: DC

## 2014-12-30 NOTE — Progress Notes (Addendum)
Subjective:  Patient denies any improvement in symptoms. When I entered room he was getting dressed from showering. Bending over to put on shoes. States pain worse than before.   Objective: Vital signs in last 24 hours: Temp:  [97.4 F (36.3 C)-98.2 F (36.8 C)] 98.1 F (36.7 C) (03/03 0640) Pulse Rate:  [73-98] 98 (03/03 0640) Resp:  [20] 20 (03/03 0640) BP: (162-195)/(88-101) 162/96 mmHg (03/03 0640) SpO2:  [93 %-100 %] 97 % (03/03 0726) Last BM Date: 12/26/14 (per patient) General:   Alert,  Well-developed, well-nourished, pleasant and cooperative in NAD Head:  Normocephalic and atraumatic. Eyes:  Sclera clear, no icterus.  Abdomen:  Soft, moderate epigastric tenderness and nondistended.  Normal bowel sounds, without guarding, and without rebound.   Extremities:  Without clubbing, deformity or edema. Neurologic:  Alert and  oriented x4;  grossly normal neurologically. Skin:  Intact without significant lesions or rashes. Psych:  Alert and cooperative. Normal mood and affect.  Intake/Output from previous day: 03/02 0701 - 03/03 0700 In: 1362.5 [I.V.:1362.5] Out: 1275 [Urine:1275] Intake/Output this shift:    Lab Results: CBC  Recent Labs  12/28/14 0546  WBC 7.6  HGB 12.6*  HCT 38.4*  MCV 103.8*  PLT 241   BMET  Recent Labs  12/27/14 1433 12/28/14 0546 12/29/14 0643  NA 130* 134* 136  K 3.9 4.2 3.8  CL 100 107 100  CO2 23 20 24   GLUCOSE 151* 87 78  BUN 5* 4* <5*  CREATININE 0.37* 0.36* 0.40*  CALCIUM 8.7 8.3* 9.1   LFTs No results for input(s): BILITOT, BILIDIR, IBILI, ALKPHOS, AST, ALT, PROT, ALBUMIN in the last 72 hours.  Recent Labs  12/28/14 0546 12/29/14 0643 12/30/14 0827  LIPASE 66* 64* 40   PT/INR No results for input(s): LABPROT, INR in the last 72 hours.    Imaging Studies: Ct Abdomen Pelvis W Contrast  12/26/2014   CLINICAL DATA:  Sharp epigastric abdominal pain for 1 day. Similar symptoms in the past associated with  pancreatitis. Nausea and vomiting. History of alcohol abuse.  EXAM: CT ABDOMEN AND PELVIS WITH CONTRAST  TECHNIQUE: Multidetector CT imaging of the abdomen and pelvis was performed using the standard protocol following bolus administration of intravenous contrast.  CONTRAST:  158mL OMNIPAQUE IOHEXOL 300 MG/ML SOLN, 44mL OMNIPAQUE IOHEXOL 300 MG/ML SOLN  COMPARISON:  Acute abdominal series December 26, 2013 at [place] hours and CT of the abdomen and pelvis October 20, 2013  FINDINGS: LUNG BASES: Included view of the lung bases are clear. Visualized heart and pericardium are unremarkable.  SOLID ORGANS: 22 x 22 mm cystic mass in pancreatic head associated with coarse calcifications, new from prior imaging. Mild pancreatic atrophy distal to this. No ductal dilatation. Faint inflammatory changes and small reactive lymph nodes about the pancreatic head. No focal areas of pancreatic hypoenhancement. The spleen, gallbladder, and adrenal glands are unremarkable. Mild focal fatty infiltration about the falciform ligament, the liver is otherwise unremarkable.  GASTROINTESTINAL TRACT: Mildly prominent small bowel in LEFT upper quadrant. The stomach, and large bowel are normal in course and caliber without inflammatory changes. Normal appendix.  KIDNEYS/ URINARY TRACT: Kidneys are orthotopic, demonstrating symmetric enhancement. No nephrolithiasis, hydronephrosis or solid renal masses. Too small to characterize hypodensity lower pole of LEFT kidney. The unopacified ureters are normal in course and caliber. Urinary bladder is partially distended and unremarkable.  PERITONEUM/RETROPERITONEUM: Aortoiliac vessels are normal in course and caliber, moderate calcific atherosclerosis, advanced for age. Scratch No lymphadenopathy by  CT size criteria. The prostate is mildly enlarged, similar. Prominent appearance of the seminal vesicles without inflammation, similar. No intraperitoneal free fluid nor free air.  SOFT TISSUE/OSSEOUS  STRUCTURES: Large broad-based disc osteophyte complexes at L4-5 and L5-S1 resultant moderate to severe canal stenosis at L4-5, at least mild at L5-S1, and moderate to severe L5-S1 neural foraminal narrowing.  IMPRESSION: 22 x 22 mm pseudocyst and pancreatic head with mild inflammation an calcifications suggesting acute on chronic pancreatitis without focal necrosis.  Mildly prominent small bowel may be seen with ileus, no bowel obstruction.  Mildly enlarged prostate and seminal vesicles could reflect sequelae of prostatitis/seminal vesiculitis without discrete abscess.  Advanced atherosclerosis for age.  Next   Electronically Signed   By: Elon Alas   On: 12/26/2014 21:13   Dg Abd Acute W/chest  12/26/2014   CLINICAL DATA:  Epigastric pain x5 days  EXAM: ACUTE ABDOMEN SERIES (ABDOMEN 2 VIEW & CHEST 1 VIEW)  COMPARISON:  Chest radiographs dated 03/03/2014  FINDINGS: Chronic interstitial markings/emphysematous changes. Mild scarring in the right upper lobe and left lung apex. No focal consolidation. No pleural effusion or pneumothorax.  The heart is normal in size.  Mildly prominent loops of small bowel in the left mid abdomen, possibly reflecting partial small bowel obstruction or small bowel ileus.  No evidence of free air under the diaphragm on the upright view.  IMPRESSION: No evidence of acute cardiopulmonary disease.  Mildly prominent loops of small bowel in the left mid abdomen, possibly reflecting partial small bowel obstruction or small bowel ileus.   Electronically Signed   By: Julian Hy M.D.   On: 12/26/2014 19:19  [2 weeks]   Assessment: 39 year old male admitted with acute on chronic pancreatitis and evidence of small pseudocyst formation on CT imaging in the setting of alcohol abuse. Although AAS and CT question mildly prominent small bowel, clinical findings are not consistent with an ileus. Patient reports worsening pain from admission. Requiring frequent dialudid and scheduled  oxycodone.     Plans for nasojejunal tube feedings today. Discussed at length with the patient who agrees with plan.  Plan: 1. Nasojejunal tube placement today.  Laureen Ochs. Bernarda Caffey [facility] [phone] 3/3/20169:50 AM     LOS: 3 days    Attending note:  Patient seen and examined. Nasojejunal tube well into the duodenum fluoroscopically. Discussed with Dr. Orlene Plum. Discussed with the nursing staff. Nutrition consult for tube feedings requested. 2 pitting should be started ASAP.

## 2014-12-30 NOTE — Progress Notes (Signed)
[name] JAS:[id number] DOB: November 11, 1975 DOA: 12/26/2014 PCP: Maricela Curet, MD             Physical Exam: Blood pressure 162/96, pulse 98, temperature 98.1 F (36.7 C), temperature source Oral, resp. rate 20, height 5\' 9"  (1.753 m), weight 126 lb 15.8 oz (57.6 kg), SpO2 93 %. lungs prolonged his return x-ray phase scattered rhonchi bilaterally mild end expiratory wheeze no rales audible heart regular rhythm no S3 or S4 no heaves thrills rubs abdomen soft mild epigastric tenderness to guarding or rebound or masses no megaly   Investigations:  No results found for this or any previous visit (from the past 240 hour(s)).   Basic Metabolic Panel:  Recent Labs  12/28/14 0546 12/29/14 0643  NA 134* 136  K 4.2 3.8  CL 107 100  CO2 20 24  GLUCOSE 87 78  BUN 4* <5*  CREATININE 0.36* 0.40*  CALCIUM 8.3* 9.1   Liver Function Tests: No results for input(s): AST, ALT, ALKPHOS, BILITOT, PROT, ALBUMIN in the last 72 hours.   CBC:  Recent Labs  12/28/14 0546  WBC 7.6  NEUTROABS 4.4  HGB 12.6*  HCT 38.4*  MCV 103.8*  PLT 241    No results found.    Medications:   Impression:  Active Problems:   Pancreatitis   Pancreatic pseudocyst   Hyponatremia   Acute pancreatitis   Pancreatitis, alcoholic, acute   Alcohol-induced chronic pancreatitis     Plan: For NaSal jejunal feeding tube with interventional radiology today  Consultants: Gastroenterology   Procedures   Antibiotics:                   Code Status: Full  Family Communication:    Disposition Plan for NaSal jejunal feeding tube  Time spent: 30 minutes   LOS: 3 days   [name] M   12/30/2014, 7:04 AM

## 2014-12-30 NOTE — Progress Notes (Addendum)
Patient stated that his nasojejunal tube slipped out. Dr.Rourk was notified,he asked for me to call radiology and see if radiology could place the tube one more time. Dr. Orlene Plum asked if nursing staff on the floor could replace tube. Was informed by nurse and nurse supervisor that it was against policy.  He said he was unable to replace it today but may be able to do it tomorrow. Dr. Gala Romney informed.

## 2014-12-31 LAB — BASIC METABOLIC PANEL
Anion gap: 10 (ref 5–15)
BUN: <5 mg/dL — ABNORMAL LOW (ref 6–23)
CALCIUM: 8.6 mg/dL (ref 8.4–10.5)
CO2: 24 mmol/L (ref 19–32)
CREATININE: 0.4 mg/dL — AB (ref 0.50–1.35)
Chloride: 103 mmol/L (ref 96–112)
GFR calc Af Amer: >90 mL/min (ref >90)
GFR calc non Af Amer: >90 mL/min (ref >90)
GLUCOSE: 71 mg/dL (ref 70–99)
Potassium: 3.3 mmol/L — ABNORMAL LOW (ref 3.5–5.1)
Sodium: 137 mmol/L (ref 135–145)

## 2014-12-31 LAB — HEPATIC FUNCTION PANEL
ALT: 19 U/L (ref 0–53)
AST: 30 U/L (ref 0–37)
Albumin: 3.6 g/dL (ref 3.5–5.2)
Alkaline Phosphatase: 105 U/L (ref 39–117)
BILIRUBIN INDIRECT: 0.5 mg/dL (ref 0.3–0.9)
BILIRUBIN TOTAL: 0.6 mg/dL (ref 0.3–1.2)
Bilirubin, Direct: 0.1 mg/dL (ref 0.0–0.5)
TOTAL PROTEIN: 7.1 g/dL (ref 6.0–8.3)

## 2014-12-31 MED ORDER — POTASSIUM CHLORIDE 10 MEQ/100ML IV SOLN
10.0000 meq | INTRAVENOUS | Status: AC
  Administered 2014-12-31 (×3): 10 meq via INTRAVENOUS
  Filled 2014-12-31: qty 100

## 2014-12-31 MED ORDER — BOOST HIGH PROTEIN PO LIQD
237.0000 mL | Freq: Three times a day (TID) | ORAL | Status: DC
  Administered 2014-12-31: 237 mL via ORAL
  Filled 2014-12-31 (×9): qty 237

## 2014-12-31 MED ORDER — HYDROMORPHONE HCL 1 MG/ML IJ SOLN
1.0000 mg | INTRAMUSCULAR | Status: DC | PRN
  Administered 2014-12-31 – 2015-01-01 (×7): 1 mg via INTRAVENOUS
  Filled 2014-12-31 (×8): qty 1

## 2014-12-31 MED ORDER — PANTOPRAZOLE SODIUM 40 MG PO TBEC
40.0000 mg | DELAYED_RELEASE_TABLET | Freq: Two times a day (BID) | ORAL | Status: DC
  Administered 2014-12-31 – 2015-01-01 (×2): 40 mg via ORAL
  Filled 2014-12-31 (×2): qty 1

## 2014-12-31 NOTE — Progress Notes (Addendum)
INITIAL NUTRITION ASSESSMENT  DOCUMENTATION CODES Per approved criteria  -Severe malnutrition in the context of chronic illness -underweight   INTERVENTION:  Recommend bolus feedings:  Osmolite 1.5 (260 ml) via NJ tube 5 times daily at 0800, 1100, 1400, 1700, 2000.  Free water flushes 100 ml before and after each feeding for a total of 1000 ml daily.  Tube feeding regimen provides 1950 kcal, 82 grams of protein, and 905 ml of H2O.  Total water including flushes 1900 ml daily.Regimen will meet 100% energy, 99% protein goals.   NUTRITION DIAGNOSIS: Malnutrition related to acute on chronic pancreatitis as evidenced by pt medical condition requiring ongoing NPO status.   Goal: Pt to tolerate initiation of enteral feeding and advancement to goal and meet >/= 90% of their estimated nutrition needs    Monitor:  Tolerance of enteral feeding, weights and labs  Reason for Assessment: enteral feeding initiation   39 y.o. male  ASSESSMENT: Pt has acute on chronic pancreatitis. (CT findings: new pseudocyst). PMH includes ETOH abuse, COPD and severe malnutrition. He is unable to meet his nutrition needs via oral intake. Pt having Nasojejunal tube placed today and will require  enteral nutrition support for approxiamately 5-6 weeks. Recommend polymeric high protein formula with bolus feeding since pt is ambulating indenpently. Home health will be following him.  Height: Ht Readings from Last 1 Encounters:  12/26/14 5\' 9"  (1.753 m)    Weight: Wt Readings from Last 1 Encounters:  12/31/14 121 lb 12.8 oz (55.248 kg)    Ideal Body Weight: 160# (72.7 kg)  % Ideal Body Weight: 76%  Wt Readings from Last 10 Encounters:  12/31/14 121 lb 12.8 oz (55.248 kg)  07/03/14 120 lb (54.432 kg)  05/12/14 120 lb (54.432 kg)  03/03/14 120 lb (54.432 kg)  02/17/14 120 lb (54.432 kg)  02/13/14 120 lb (54.432 kg)  01/24/14 120 lb (54.432 kg)  12/28/13 120 lb (54.432 kg)  12/07/13 120 lb (54.432 kg)   10/20/13 120 lb (54.432 kg)    Usual Body Weight: 120#  % Usual Body Weight: 101%  BMI:  Body mass index is 17.98 kg/(m^2). underweight  Estimated Nutritional Needs: Kcal: 1925 Protein: 85-95 gr Fluid: 1900 ml daily  Skin: intact  Diet Order: Diet NPO time specified  EDUCATION NEEDS: -No education needs identified at this time   Intake/Output Summary (Last 24 hours) at 12/31/14 1451 Last data filed at 12/31/14 0600  Gross per 24 hour  Intake   3930 ml  Output   1275 ml  Net   2655 ml    Last BM:  12/26/14  Labs:   Recent Labs Lab 12/29/14 0643 12/30/14 0827 12/31/14 0517  NA 136 133* 137  K 3.8 3.5 3.3*  CL 100 100 103  CO2 24 19 24   BUN <5* <5* <5*  CREATININE 0.40* 0.53 0.40*  CALCIUM 9.1 8.9 8.6  GLUCOSE 78 76 71    CBG (last 3)  No results for input(s): GLUCAP in the last 72 hours.  Scheduled Meds: . albuterol  2.5 mg Nebulization QID  . carbamazepine  400 mg Oral BID  . levETIRAcetam  500 mg Oral BID  . mometasone-formoterol  2 puff Inhalation BID  . nicotine  21 mg Transdermal Daily  . oxyCODONE-acetaminophen   Oral QID  . pantoprazole (PROTONIX) IV  40 mg Intravenous Q24H  . potassium chloride  10 mEq Intravenous Q1 Hr x 3  . sodium chloride  3 mL Intravenous Q12H    Continuous  Infusions: . sodium chloride 150 mL/hr at 12/31/14 1050    Past Medical History  Diagnosis Date  . Pancreatitis   . Seizures   . Back pain   . ETOH abuse   . COPD (chronic obstructive pulmonary disease)   . SAH (subarachnoid hemorrhage)   . Macrocytosis 08/17/2011  . Bradycardia 08/17/2011  . Pneumonia   . GERD (gastroesophageal reflux disease)     Past Surgical History  Procedure Laterality Date  . Back surgery    . Septoplasty      Colman Cater MS,RD,CSG,LDN Office: [phone] Pager: [phone]

## 2014-12-31 NOTE — Progress Notes (Signed)
Nasojejunal tube pulled out patient is hypokalemic 3.3 potassium and hypoproteinemic [name] OYW:[id number] DOB: 1975-10-31 DOA: 12/26/2014 PCP: Jose Curet, MD             Physical Exam: Blood pressure 169/73, pulse 71, temperature 97.3 F (36.3 C), temperature source Oral, resp. rate 20, height _0  (1.753 m), weight 121 lb 12.8 oz (55.248 kg), SpO2 99 %. lungs coarse rhonchi bilaterally prolonged his return x-ray phase mild and very wheeze no rales appreciated heart regular rhythm no S3 or S4 no heaves rubs abdomen soft minimal epigastric tenderness no guarding or rebound or masses no megaly   Investigations:  No results found for this or any previous visit (from the past 240 hour(s)).   Basic Metabolic Panel:  Recent Labs  12/30/14 0827 12/31/14 0517  NA 133* 137  K 3.5 3.3*  CL 100 103  CO2 19 24  GLUCOSE 76 71  BUN <5* <5*  CREATININE 0.53 0.40*  CALCIUM 8.9 8.6   Liver Function Tests: No results for input(s): AST, ALT, ALKPHOS, BILITOT, PROT, ALBUMIN in the last 72 hours.   CBC: No results for input(s): WBC, NEUTROABS, HGB, HCT, MCV, PLT in the last 72 hours.  Dg Loyce Dys Tube Plc W/fl W/rad  12/30/2014   CLINICAL DATA:    NASO G TUBE PLACEMENT WITH FLUORO  Fluoroscopy was utilized by the requesting physician.  No radiographic  interpretation.       Medications:   Impression: Active Problems:   Pancreatitis   Pancreatic pseudocyst   Hyponatremia   Acute pancreatitis   Pancreatitis, alcoholic, acute   Alcohol-induced chronic pancreatitis     Plan: Reinserted nasal jejunal tube administer tube feedings dietary consultation for hypoproteinemia KCl 10 mEq IV 3 check hepatic function and be met in a.m.  Consultants: Gastroenterology   Procedures insertion of nasojejunal tube   Antibiotics:                   Code Status full  Family Communication:    Disposition Plan IV KCl 10 mEq 3 check hepatic panel be met in a.m.  home health care and dietary consult for tube feedings  Time spent: 30 minutes   LOS: 4 days   [name] M   12/31/2014, 2:05 PM

## 2014-12-31 NOTE — Progress Notes (Signed)
    Subjective: Clinically stable. Abdominal pain the same as yesterday, deneis N/V. Denies overt GI bleed. States NJ tube that was placed yesterday slipped out on it's own. Per yesterday's notes, against policy for nursing to replace, Dr. Orlene Plum in Radiology could possible be able to replace it today. Mildly elevated Lipase on admission (64-66) normal yesterday (40). Per RN no indication as to time of NJ replacement.  Objective: Vital signs in last 24 hours: Temp:  [97.3 F (36.3 C)-97.8 F (36.6 C)] 97.3 F (36.3 C) (03/04 1319) Pulse Rate:  [71-85] 71 (03/04 1319) Resp:  [20] 20 (03/04 1319) BP: (151-172)/(63-94) 169/73 mmHg (03/04 1319) SpO2:  [94 %-100 %] 99 % (03/04 1319) Weight:  [121 lb 12.8 oz (55.248 kg)] 121 lb 12.8 oz (55.248 kg) (03/04 0430) Last BM Date: 12/26/14 General:   Alert and oriented, pleasant Head:  Normocephalic and atraumatic. Heart:  S1, S2 present, no murmurs noted.  Lungs: Clear to auscultation bilaterally, without wheezing, rales, or rhonchi.  Abdomen:  Bowel sounds present, soft, non-distended. Epigastric TTP. No HSM or hernias noted. No rebound or guarding. No masses appreciated  Neurologic:  Alert and  oriented x4;  grossly normal neurologically. Skin:  Warm and dry, intact without significant lesions.  Psych:  Alert and cooperative. Normal mood and affect.  Intake/Output from previous day: 03/03 0701 - 03/04 0700 In: 3930 [P.O.:480; I.V.:3450] Out: 1275 [Urine:1275] Intake/Output this shift:    Lab Results: No results for input(s): WBC, HGB, HCT, PLT in the last 72 hours. BMET  Recent Labs  12/29/14 0643 12/30/14 0827 12/31/14 0517  NA 136 133* 137  K 3.8 3.5 3.3*  CL 100 100 103  CO2 24 19 24   GLUCOSE 78 76 71  BUN <5* <5* <5*  CREATININE 0.40* 0.53 0.40*  CALCIUM 9.1 8.9 8.6   LFT No results for input(s): PROT, ALBUMIN, AST, ALT, ALKPHOS, BILITOT, BILIDIR, IBILI in the last 72 hours. PT/INR No results for input(s): LABPROT,  INR in the last 72 hours. Hepatitis Panel No results for input(s): HEPBSAG, HCVAB, HEPAIGM, HEPBIGM in the last 72 hours.   Studies/Results: Dg Loyce Dys Tube Plc W/fl W/rad  12/30/2014   CLINICAL DATA:    NASO G TUBE PLACEMENT WITH FLUORO  Fluoroscopy was utilized by the requesting physician.  No radiographic  interpretation.     Assessment: 39 year old male admitted with acute on chronic pancreatitis and evidence of small pseudocyst formation on CT imaging in the setting of alcohol abuse. Although AAS and CT question mildly prominent small bowel, clinical findings are not consistent with an ileus. Patient pain is stable, no improvement but no worsening. NJ tube placed yesterday for feedings but came out, per patient. Requiring frequent dialudid and scheduled oxycodone, approximately every 2-3 hours. Discussed plans for NJ replacement today and agrees with plan but is requesting pain medication beforehand.   Plan: 1. Hold off on Lovenox while awaiting replacement of NJ tube. Currnelty using SCDs and ambulation per protocol for DVT prophylaxis 2. Replace NJ tube and can begin tube feeds at that point 3. Continue pain management as ordered.    Walden Field, AGNP-C Adult & Gerontological Nurse Practitioner [facility]     LOS: 4 days    12/31/2014, 1:29 PM

## 2015-01-01 NOTE — Progress Notes (Signed)
Patient went to bathroom.  Staff in patient room while patient in bathroom.  Staff opened patient door and patient was crawling on the bathroom floor, stating he was looking for something.  Patient stated he did not fall, but before patient went to bathroom, patient walked in hallway and thought he was at home and was going to walk to [employer].  Patient placed back in bed, explained to patient that since he was found crawling on bathroom floor, that his bed alarm would be placed for safety issues.  Patient not happy with bed alarm being placed.

## 2015-01-01 NOTE — Progress Notes (Signed)
Pt decided to leave hospital against medical advice, reviewed risks involved with pt.  Pt verbalized understanding.  Nonresponsibility for Leaving Against Medical Advice gone over with patient and signed, placed on chart.  Notified Dr Anastasio Champion, on call for Primary Care.

## 2015-01-01 NOTE — Progress Notes (Signed)
Patient did not have nasojejunal tube reinserted yesterday he was uncooperative and refused to have a.m. blood work drawn this morning for lipase and electrolytes EDD clear liquids last night supposedly tolerated it as well as clear liquids this morning patient wants to leave I felt it is unsafe for him to leave the hospital to observe his pancreas and bowel function with advancement of diet to draw serial labs likewise I also stressed the need for the nasojejunal tube which he is not receptive to at present. There is no nausea no vomiting JAQUA CHING GGE:[id number] DOB: Aug 23, 1976 DOA: 12/26/2014 PCP: Maricela Curet, MD             Physical Exam: Blood pressure 160/63, pulse 61, temperature 97.6 F (36.4 C), temperature source Oral, resp. rate 20, height 5\' 9"  (1.753 m), weight 127 lb 13.9 oz (58 kg), SpO2 99 %. neck no JVD no carotid bruits no thyromegaly lungs prolonged respiratory internal x-ray phase scattered rhonchi no rales heart regular rhythm no S3-S4 knee feels rubs abdomen no epigastric tenderness no guarding or rebound masses no megaly   Investigations:  No results found for this or any previous visit (from the past 240 hour(s)).   Basic Metabolic Panel:  Recent Labs  12/30/14 0827 12/31/14 0517  NA 133* 137  K 3.5 3.3*  CL 100 103  CO2 19 24  GLUCOSE 76 71  BUN <5* <5*  CREATININE 0.53 0.40*  CALCIUM 8.9 8.6   Liver Function Tests:  Recent Labs  12/31/14 0700  AST 30  ALT 19  ALKPHOS 105  BILITOT 0.6  PROT 7.1  ALBUMIN 3.6     CBC: No results for input(s): WBC, NEUTROABS, HGB, HCT, MCV, PLT in the last 72 hours.  Dg Loyce Dys Tube Plc W/fl W/rad  12/30/2014   CLINICAL DATA:    NASO G TUBE PLACEMENT WITH FLUORO  Fluoroscopy was utilized by the requesting physician.  No radiographic  interpretation.       Medications:   Impression: Chronic noncompliance  Active Problems:   Pancreatitis   Pancreatic pseudocyst   Hyponatremia   Acute  pancreatitis   Pancreatitis, alcoholic, acute   Alcohol-induced chronic pancreatitis     Plan: Patient strongly advised to remain in hospital to observe dietary progressions and also to reinsert nasojejunal tube as indicated by GI several days ago older serial B mitts and lipases I do not feel it is safe for him to leave the hospital at present   Consultants: Gastroenterology   Procedures insertion of nasojejunal tube which was pulled out by patient   Antibiotics:                   Code Status: Full  Family Communication:    Disposition Plan patient should remain in the hospital reinsert nasojejunal tube monitor electrolytes and lipase and advance diet under the watchful eye of hospital.  Time spent: 40 minutes   LOS: 5 days   Samuele Storey M   01/01/2015, 9:50 AM

## 2015-01-01 NOTE — Progress Notes (Signed)
This nurse in to assess patient and pass due medications, Pt had removed IV from Left arm that was placed this morning.  States he isnt going to need it and doesn't want another one placed.  Informed Dr Cindie Laroche when rounded.

## 2015-01-01 NOTE — Progress Notes (Signed)
Patient desires to go home. Patient would like to know if MD on call could discharge him and he would come back to floor in the morning to see primary doctor.  Explained to patient that only primary MD could discharge patient, and that if he left floor he would have to sign AMA papers.  Patient did not seem to fully understand that idea, but called md on call and notified him of patient feelings.  MD on call reiterated to staff that he would not discharge patient, but he could leave AMA if he would not be able to stay.  Patient appeared upset that he would have to stay another night, but eventually decided to stay one more night to see primary MD.

## 2015-01-03 NOTE — Care Management Utilization Note (Signed)
UR completed 

## 2015-01-13 ENCOUNTER — Emergency Department (HOSPITAL_COMMUNITY): Payer: Medicaid Other

## 2015-01-13 ENCOUNTER — Encounter (HOSPITAL_COMMUNITY): Payer: Self-pay | Admitting: *Deleted

## 2015-01-13 ENCOUNTER — Emergency Department (HOSPITAL_COMMUNITY)
Admission: EM | Admit: 2015-01-13 | Discharge: 2015-01-13 | Disposition: A | Discharge status: Home / Self Care | Payer: Medicaid Other | Attending: Emergency Medicine | Admitting: Emergency Medicine

## 2015-01-13 DIAGNOSIS — J441 Chronic obstructive pulmonary disease with (acute) exacerbation: Secondary | ICD-10-CM | POA: Insufficient documentation

## 2015-01-13 DIAGNOSIS — Z7951 Long term (current) use of inhaled steroids: Secondary | ICD-10-CM | POA: Insufficient documentation

## 2015-01-13 DIAGNOSIS — Z8701 Personal history of pneumonia (recurrent): Secondary | ICD-10-CM | POA: Insufficient documentation

## 2015-01-13 DIAGNOSIS — J449 Chronic obstructive pulmonary disease, unspecified: Secondary | ICD-10-CM

## 2015-01-13 DIAGNOSIS — K219 Gastro-esophageal reflux disease without esophagitis: Secondary | ICD-10-CM | POA: Insufficient documentation

## 2015-01-13 DIAGNOSIS — Z8679 Personal history of other diseases of the circulatory system: Secondary | ICD-10-CM | POA: Diagnosis not present

## 2015-01-13 DIAGNOSIS — Z7952 Long term (current) use of systemic steroids: Secondary | ICD-10-CM | POA: Diagnosis not present

## 2015-01-13 DIAGNOSIS — Z720 Tobacco use: Secondary | ICD-10-CM | POA: Diagnosis not present

## 2015-01-13 DIAGNOSIS — G40909 Epilepsy, unspecified, not intractable, without status epilepticus: Secondary | ICD-10-CM | POA: Diagnosis not present

## 2015-01-13 DIAGNOSIS — R0602 Shortness of breath: Secondary | ICD-10-CM | POA: Diagnosis present

## 2015-01-13 DIAGNOSIS — J069 Acute upper respiratory infection, unspecified: Secondary | ICD-10-CM | POA: Insufficient documentation

## 2015-01-13 DIAGNOSIS — Z79899 Other long term (current) drug therapy: Secondary | ICD-10-CM | POA: Insufficient documentation

## 2015-01-13 DIAGNOSIS — Z862 Personal history of diseases of the blood and blood-forming organs and certain disorders involving the immune mechanism: Secondary | ICD-10-CM | POA: Diagnosis not present

## 2015-01-13 MED ORDER — IPRATROPIUM BROMIDE 0.02 % IN SOLN: 0.5000 mg | Freq: Once | RESPIRATORY_TRACT | Status: DC

## 2015-01-13 MED ORDER — ALBUTEROL SULFATE (2.5 MG/3ML) 0.083% IN NEBU
2.5000 mg | INHALATION_SOLUTION | Freq: Once | RESPIRATORY_TRACT | Status: AC
  Administered 2015-01-13: 2.5 mg via RESPIRATORY_TRACT
  Filled 2015-01-13: qty 3

## 2015-01-13 MED ORDER — IPRATROPIUM-ALBUTEROL 0.5-2.5 (3) MG/3ML IN SOLN
3.0000 mL | Freq: Once | RESPIRATORY_TRACT | Status: AC
  Administered 2015-01-13: 3 mL via RESPIRATORY_TRACT
  Filled 2015-01-13: qty 3

## 2015-01-13 MED ORDER — ALBUTEROL SULFATE (2.5 MG/3ML) 0.083% IN NEBU: 5.0000 mg | INHALATION_SOLUTION | Freq: Once | RESPIRATORY_TRACT | Status: DC

## 2015-01-13 MED ORDER — PREDNISONE 20 MG PO TABS: 60.0000 mg | ORAL_TABLET | Freq: Every day | ORAL | Status: DC

## 2015-01-13 MED ORDER — ALBUTEROL SULFATE HFA 108 (90 BASE) MCG/ACT IN AERS
2.0000 | INHALATION_SPRAY | RESPIRATORY_TRACT | Status: DC | PRN
  Administered 2015-01-13: 2 via RESPIRATORY_TRACT
  Filled 2015-01-13: qty 6.7

## 2015-01-13 NOTE — ED Notes (Signed)
Pt's friend Gene will come pick up pt at [place], [phone]

## 2015-01-13 NOTE — ED Notes (Signed)
Pt alert & oriented x4, stable gait. Patient given discharge instructions, paperwork & prescription(s). Patient  instructed to stop at the registration desk to finish any additional paperwork. Patient verbalized understanding. Pt left department w/ no further questions. 

## 2015-01-13 NOTE — Discharge Instructions (Signed)
Upper Respiratory Infection, Adult An upper respiratory infection (URI) is also sometimes known as the common cold. The upper respiratory tract includes the nose, sinuses, throat, trachea, and bronchi. Bronchi are the airways leading to the lungs. Most people improve within 1 week, but symptoms can last up to 2 weeks. A residual cough may last even longer.  CAUSES Many different viruses can infect the tissues lining the upper respiratory tract. The tissues become irritated and inflamed and often become very moist. Mucus production is also common. A cold is contagious. You can easily spread the virus to others by oral contact. This includes kissing, sharing a glass, coughing, or sneezing. Touching your mouth or nose and then touching a surface, which is then touched by another person, can also spread the virus. SYMPTOMS  Symptoms typically develop 1 to 3 days after you come in contact with a cold virus. Symptoms vary from person to person. They may include:  Runny nose.  Sneezing.  Nasal congestion.  Sinus irritation.  Sore throat.  Loss of voice (laryngitis).  Cough.  Fatigue.  Muscle aches.  Loss of appetite.  Headache.  Low-grade fever. DIAGNOSIS  You might diagnose your own cold based on familiar symptoms, since most people get a cold 2 to 3 times a year. Your caregiver can confirm this based on your exam. Most importantly, your caregiver can check that your symptoms are not due to another disease such as strep throat, sinusitis, pneumonia, asthma, or epiglottitis. Blood tests, throat tests, and X-rays are not necessary to diagnose a common cold, but they may sometimes be helpful in excluding other more serious diseases. Your caregiver will decide if any further tests are required. RISKS AND COMPLICATIONS  You may be at risk for a more severe case of the common cold if you smoke cigarettes, have chronic heart disease (such as heart failure) or lung disease (such as asthma), or if  you have a weakened immune system. The very young and very old are also at risk for more serious infections. Bacterial sinusitis, middle ear infections, and bacterial pneumonia can complicate the common cold. The common cold can worsen asthma and chronic obstructive pulmonary disease (COPD). Sometimes, these complications can require emergency medical care and may be life-threatening. PREVENTION  The best way to protect against getting a cold is to practice good hygiene. Avoid oral or hand contact with people with cold symptoms. Wash your hands often if contact occurs. There is no clear evidence that vitamin C, vitamin E, echinacea, or exercise reduces the chance of developing a cold. However, it is always recommended to get plenty of rest and practice good nutrition. TREATMENT  Treatment is directed at relieving symptoms. There is no cure. Antibiotics are not effective, because the infection is caused by a virus, not by bacteria. Treatment may include:  Increased fluid intake. Sports drinks offer valuable electrolytes, sugars, and fluids.  Breathing heated mist or steam (vaporizer or shower).  Eating chicken soup or other clear broths, and maintaining good nutrition.  Getting plenty of rest.  Using gargles or lozenges for comfort.  Controlling fevers with ibuprofen or acetaminophen as directed by your caregiver.  Increasing usage of your inhaler if you have asthma. Zinc gel and zinc lozenges, taken in the first 24 hours of the common cold, can shorten the duration and lessen the severity of symptoms. Pain medicines may help with fever, muscle aches, and throat pain. A variety of non-prescription medicines are available to treat congestion and runny nose. Your caregiver   can make recommendations and may suggest nasal or lung inhalers for other symptoms.  HOME CARE INSTRUCTIONS   Only take over-the-counter or prescription medicines for pain, discomfort, or fever as directed by your  caregiver.  Use a warm mist humidifier or inhale steam from a shower to increase air moisture. This may keep secretions moist and make it easier to breathe.  Drink enough water and fluids to keep your urine clear or pale yellow.  Rest as needed.  Return to work when your temperature has returned to normal or as your caregiver advises. You may need to stay home longer to avoid infecting others. You can also use a face mask and careful hand washing to prevent spread of the virus. SEEK MEDICAL CARE IF:   After the first few days, you feel you are getting worse rather than better.  You need your caregiver's advice about medicines to control symptoms.  You develop chills, worsening shortness of breath, or brown or red sputum. These may be signs of pneumonia.  You develop yellow or brown nasal discharge or pain in the face, especially when you bend forward. These may be signs of sinusitis.  You develop a fever, swollen neck glands, pain with swallowing, or white areas in the back of your throat. These may be signs of strep throat. SEEK IMMEDIATE MEDICAL CARE IF:   You have a fever.  You develop severe or persistent headache, ear pain, sinus pain, or chest pain.  You develop wheezing, a prolonged cough, cough up blood, or have a change in your usual mucus (if you have chronic lung disease).  You develop sore muscles or a stiff neck. Document Released: 04/10/2001 Document Revised: 01/07/2012 Document Reviewed: 01/20/2014 Exit[employer] Patient Information 2015 [employer]. This information is not intended to replace advice given to you by your health care provider. Make sure you discuss any questions you have with your health care provider.  

## 2015-01-13 NOTE — ED Provider Notes (Signed)
CSN: [id number]     Arrival date & time 01/13/15  1000 History  This chart was scribed for Davonna Belling, MD by Einar Pheasant, Medical Scribe. This patient was seen in room APA01/APA01 and the patient's care was started at 10:34 AM.     Chief Complaint  Patient presents with  . Shortness of Breath   The history is provided by the patient and medical records. No language interpreter was used.   HPI Comments: [name] is a 39 y.o. male with PMhx of COPD and persistent tobacco usage presents to the Emergency Department complaining of sudden onset SOB 1 hour prior to arrival while using the bathroom. He reports using his inhaler without adequate relief. Pt states that he feels much better after getting oxygen. He reports 1-2 flare ups every couple of months. Pt denies any fever, neck pain, sore throat, visual disturbance, CP, cough, abdominal pain, nausea, emesis, diarrhea, urinary symptoms, back pain, HA, weakness, numbness and rash as associated symptoms.      Past Medical History  Diagnosis Date  . Pancreatitis   . Seizures   . Back pain   . ETOH abuse   . COPD (chronic obstructive pulmonary disease)   . SAH (subarachnoid hemorrhage)   . Macrocytosis 08/17/2011  . Bradycardia 08/17/2011  . Pneumonia   . GERD (gastroesophageal reflux disease)    Past Surgical History  Procedure Laterality Date  . Back surgery    . Septoplasty     Family History  Problem Relation Age of Onset  . Seizures Father   . Alcohol abuse Father   . Coronary artery disease Mother     deceased age 2  . Colon cancer Neg Hx    History  Substance Use Topics  . Smoking status: Current Every Day Smoker -- 1.00 packs/day for 20 years    Types: Cigarettes  . Smokeless tobacco: Never Used  . Alcohol Use: 25.2 oz/week    42 Cans of beer per week     Comment: daily, at least 12 beers a day     Review of Systems  Respiratory: Positive for shortness of breath.   All other systems reviewed and  are negative.  Allergies  Review of patient's allergies indicates no known allergies.  Home Medications   Prior to Admission medications   Medication Sig Start Date End Date Taking? Authorizing Provider  albuterol (PROVENTIL HFA;VENTOLIN HFA) 108 (90 BASE) MCG/ACT inhaler Inhale 2 puffs into the lungs every 6 (six) hours as needed for wheezing or shortness of breath.   Yes Historical Provider, MD  albuterol (PROVENTIL) (2.5 MG/3ML) 0.083% nebulizer solution Take 3 mLs (2.5 mg total) by nebulization every 4 (four) hours as needed for wheezing or shortness of breath. 09/09/13  Yes Orpah Greek, MD  carbamazepine (TEGRETOL) 200 MG tablet Take 400 mg by mouth 2 (two) times daily.   Yes Historical Provider, MD  Fluticasone-Salmeterol (ADVAIR) 250-50 MCG/DOSE AEPB Inhale 1 puff into the lungs 2 (two) times daily.   Yes Historical Provider, MD  levETIRAcetam (KEPPRA) 500 MG tablet Take 500 mg by mouth 2 (two) times daily.    Yes Historical Provider, MD  oxyCODONE-acetaminophen (PERCOCET) 10-325 MG per tablet Take 1 tablet by mouth 4 (four) times daily.    Yes Historical Provider, MD  ibuprofen (ADVIL,MOTRIN) 800 MG tablet Take 1 tablet (800 mg total) by mouth 3 (three) times daily. Patient not taking: Reported on 12/26/2014 07/03/14   Britt Bottom, NP  pantoprazole (PROTONIX) 20 MG  tablet Take 1 tablet (20 mg total) by mouth daily. Patient not taking: Reported on 12/26/2014 03/03/14   Carmin Muskrat, MD  predniSONE (DELTASONE) 20 MG tablet Take 3 tablets (60 mg total) by mouth daily. 01/13/15   Davonna Belling, MD   BP 158/70 mmHg  Pulse 62  Temp(Src) 97.8 F (36.6 C) (Oral)  Resp 30  Ht 5\' 9"  (1.753 m)  Wt 120 lb (54.432 kg)  BMI 17.71 kg/m2  SpO2 100%  Physical Exam  Constitutional: He appears well-developed and well-nourished. No distress.  HENT:  Head: Normocephalic and atraumatic.  Eyes: Conjunctivae are normal. Right eye exhibits no discharge. Left eye exhibits no  discharge.  Neck: Neck supple.  Cardiovascular: Normal rate, regular rhythm and normal heart sounds.  Exam reveals no gallop and no friction rub.   No murmur heard. Pulmonary/Chest: Effort normal. No respiratory distress. He has wheezes.  Harsh breath sounds bilaterally with diffuse wheezing with prolonged expirations.   Abdominal: Soft. He exhibits no distension. There is no tenderness.  Musculoskeletal: He exhibits no edema or tenderness.  Neurological: He is alert.  Skin: Skin is warm and dry.  Psychiatric: He has a normal mood and affect. His behavior is normal. Thought content normal.  Nursing note and vitals reviewed.   ED Course  Procedures (including critical care time)  10:41 AM- Pt advised of plan for treatment and pt agrees.  Labs Review Labs Reviewed - No data to display  Imaging Review Dg Chest 2 View  01/13/2015   CLINICAL DATA:  Shortness of breath beginning 1 hour ago, history COPD, smoking  EXAM: CHEST  2 VIEW  COMPARISON:  12/26/2014  FINDINGS: Normal heart size, mediastinal contours, and pulmonary vascularity.  COPD changes with biapical scarring.  Lungs otherwise clear.  No infiltrate, pleural effusion or pneumothorax.  Bones demineralized with chronic compression deformity of a mid thoracic vertebra.  IMPRESSION: COPD changes with biapical scarring.  No acute abnormalities.   Electronically Signed   By: Lavonia Dana M.D.   On: 01/13/2015 12:04     EKG Interpretation   Date/Time:  Thursday January 13 2015 10:10:11 EDT Ventricular Rate:  61 PR Interval:  228 QRS Duration: 105 QT Interval:  471 QTC Calculation: 474 R Axis:   116 Text Interpretation:  Sinus or ectopic atrial rhythm Prolonged PR interval  Left atrial enlargement Right axis deviation Consider left ventricular  hypertrophy Anterior Q waves, possibly due to LVH Abnrm T, consider  ischemia, anterolateral lds T waves more peaked than previous Confirmed by  Hannahmarie Asberry  MD, Chasitty Hehl [phone]) on 01/13/2015  10:13:49 AM      MDM   Final diagnoses:  URI (upper respiratory infection)  Chronic obstructive pulmonary disease, unspecified COPD, unspecified chronic bronchitis type    Patient with URI likely exacerbation his COPD. X-ray reassuring. Feels better after treatment. Wheezing is improved. Will give short dose of steroids and albuterol. States he has nebulizer fluid but not inhaler home.  I personally performed the services described in this documentation, which was scribed in my presence. The recorded information has been reviewed and is accurate.     Davonna Belling, MD 01/13/15 1351

## 2015-01-13 NOTE — ED Notes (Addendum)
Applied 2L O2 [place], pt breathing more normal at this time. Breathing has slowed to 20RR/minute.

## 2015-01-13 NOTE — ED Notes (Signed)
Pt states had sudden onset of shortness of breath 1 hour prior to arrival, pt used his inhaler with no relief.

## 2015-02-03 NOTE — Discharge Summary (Signed)
Physician Discharge Summary  [name] GSU:[id number] DOB: May 13, 1976 DOA: 12/26/2014  PCP: Maricela Curet, MD  Admit date: 12/26/2014 Discharge date: 02/03/2015   Recommendations for Outpatient Follow-up:  Patient was cautioned to follow my office in 1-2 days time as he signed out AMA and took out NG tube. He was told to take all medicines and to stop by office for organization of all outpatient medicines and follow-up Discharge Diagnoses:  Active Problems:   Pancreatitis   Pancreatic pseudocyst   Hyponatremia   Acute pancreatitis   Pancreatitis, alcoholic, acute   Alcohol-induced chronic pancreatitis   Discharge Condition: Stable  Filed Weights   12/29/14 0700 12/31/14 0430 01/01/15 0541  Weight: 126 lb 15.8 oz (57.6 kg) 121 lb 12.8 oz (55.248 kg) 127 lb 13.9 oz (58 kg)    History of present illness:  Patient was admitted with X acute on chronic Ethanol-induced Pancreatitis with Diffuse and Severe Epigastric and Generalized Abdominal Pain Placed Nothing by Mouth on PPI IV and Narcotic Opioid Analgesic Therapy Patient Subsequently Had a CT Scan Showing Pseudocyst Formation of the Head of the Pancreas GI and Surgical Consultation Was Obtained He Was Sent to [facility] for Insertion of Nasopharyngeal 22 Bypass Pancreas This Procedure Was Aborted Due To Patient Being on Corporative and Felt to Transfer Him Back at [facility] the More Conservative Therapy after an Additional 36 Hours of Forestine Na Patient Signed out [place] He Was Cautioned to Follow-Up in My Office within 24-48 Hours for Assessment of Clinical Status and Organization of Home [place] Hospital Course:  See history of present illness  Procedures:  Placement of nasogastric tube  Consultations:  Gastroenterology interventional radiology  Discharge Instructions     Medication List    ASK your doctor about these medications        albuterol 108 (90 BASE) MCG/ACT inhaler  Commonly known as:   PROVENTIL HFA;VENTOLIN HFA  Inhale 2 puffs into the lungs every 6 (six) hours as needed for wheezing or shortness of breath.     albuterol (2.5 MG/3ML) 0.083% nebulizer solution  Commonly known as:  PROVENTIL  Take 3 mLs (2.5 mg total) by nebulization every 4 (four) hours as needed for wheezing or shortness of breath.     carbamazepine 200 MG tablet  Commonly known as:  TEGRETOL  Take 400 mg by mouth 2 (two) times daily.     Fluticasone-Salmeterol 250-50 MCG/DOSE Aepb  Commonly known as:  ADVAIR  Inhale 1 puff into the lungs 2 (two) times daily.     ibuprofen 800 MG tablet  Commonly known as:  ADVIL,MOTRIN  Take 1 tablet (800 mg total) by mouth 3 (three) times daily.     levETIRAcetam 500 MG tablet  Commonly known as:  KEPPRA  Take 500 mg by mouth 2 (two) times daily.     oxyCODONE-acetaminophen 10-325 MG per tablet  Commonly known as:  PERCOCET  Take 1 tablet by mouth 4 (four) times daily.     pantoprazole 20 MG tablet  Commonly known as:  PROTONIX  Take 1 tablet (20 mg total) by mouth daily.       No Known Allergies    The results of significant diagnostics from this hospitalization (including imaging, microbiology, ancillary and laboratory) are listed below for reference.    Significant Diagnostic Studies: Dg Chest 2 View  01/13/2015   CLINICAL DATA:  Shortness of breath beginning 1 hour ago, history COPD, smoking  EXAM: CHEST  2 VIEW  COMPARISON:  12/26/2014  FINDINGS: Normal heart size, mediastinal contours, and pulmonary vascularity.  COPD changes with biapical scarring.  Lungs otherwise clear.  No infiltrate, pleural effusion or pneumothorax.  Bones demineralized with chronic compression deformity of a mid thoracic vertebra.  IMPRESSION: COPD changes with biapical scarring.  No acute abnormalities.   Electronically Signed   By: Lavonia Dana M.D.   On: 01/13/2015 12:04    Microbiology: No results found for this or any previous visit (from the past 240 hour(s)).    Labs: Basic Metabolic Panel: No results for input(s): NA, K, CL, CO2, GLUCOSE, BUN, CREATININE, CALCIUM, MG, PHOS in the last 168 hours. Liver Function Tests: No results for input(s): AST, ALT, ALKPHOS, BILITOT, PROT, ALBUMIN in the last 168 hours. No results for input(s): LIPASE, AMYLASE in the last 168 hours. No results for input(s): AMMONIA in the last 168 hours. CBC: No results for input(s): WBC, NEUTROABS, HGB, HCT, MCV, PLT in the last 168 hours. Cardiac Enzymes: No results for input(s): CKTOTAL, CKMB, CKMBINDEX, TROPONINI in the last 168 hours. BNP: BNP (last 3 results) No results for input(s): BNP in the last 8760 hours.  ProBNP (last 3 results) No results for input(s): PROBNP in the last 8760 hours.  CBG: No results for input(s): GLUCAP in the last 168 hours.     Signed:  Camari Wisham Jerilynn Mages  [facility] Pager: [phone] 02/03/2015, 1:28 PM

## 2015-02-19 ENCOUNTER — Encounter (HOSPITAL_COMMUNITY): Payer: Self-pay

## 2015-02-19 ENCOUNTER — Emergency Department (HOSPITAL_COMMUNITY)
Admission: EM | Admit: 2015-02-19 | Discharge: 2015-02-19 | Disposition: A | Discharge status: Home / Self Care | Payer: Medicaid Other | Attending: Emergency Medicine | Admitting: Emergency Medicine

## 2015-02-19 DIAGNOSIS — Z862 Personal history of diseases of the blood and blood-forming organs and certain disorders involving the immune mechanism: Secondary | ICD-10-CM | POA: Insufficient documentation

## 2015-02-19 DIAGNOSIS — Z720 Tobacco use: Secondary | ICD-10-CM | POA: Diagnosis not present

## 2015-02-19 DIAGNOSIS — Z8679 Personal history of other diseases of the circulatory system: Secondary | ICD-10-CM | POA: Insufficient documentation

## 2015-02-19 DIAGNOSIS — R1013 Epigastric pain: Secondary | ICD-10-CM | POA: Insufficient documentation

## 2015-02-19 DIAGNOSIS — J449 Chronic obstructive pulmonary disease, unspecified: Secondary | ICD-10-CM | POA: Insufficient documentation

## 2015-02-19 DIAGNOSIS — Z8701 Personal history of pneumonia (recurrent): Secondary | ICD-10-CM | POA: Insufficient documentation

## 2015-02-19 DIAGNOSIS — G8929 Other chronic pain: Secondary | ICD-10-CM | POA: Diagnosis not present

## 2015-02-19 DIAGNOSIS — K219 Gastro-esophageal reflux disease without esophagitis: Secondary | ICD-10-CM | POA: Diagnosis not present

## 2015-02-19 DIAGNOSIS — Z79899 Other long term (current) drug therapy: Secondary | ICD-10-CM | POA: Diagnosis not present

## 2015-02-19 DIAGNOSIS — R109 Unspecified abdominal pain: Secondary | ICD-10-CM | POA: Diagnosis present

## 2015-02-19 DIAGNOSIS — G40909 Epilepsy, unspecified, not intractable, without status epilepticus: Secondary | ICD-10-CM | POA: Insufficient documentation

## 2015-02-19 DIAGNOSIS — Z7951 Long term (current) use of inhaled steroids: Secondary | ICD-10-CM | POA: Diagnosis not present

## 2015-02-19 LAB — COMPREHENSIVE METABOLIC PANEL
ALBUMIN: 4.3 g/dL (ref 3.5–5.2)
ALK PHOS: 103 U/L (ref 39–117)
ALT: 15 U/L (ref 0–53)
AST: 33 U/L (ref 0–37)
Anion gap: 11 (ref 5–15)
BILIRUBIN TOTAL: 0.6 mg/dL (ref 0.3–1.2)
BUN: 5 mg/dL — ABNORMAL LOW (ref 6–23)
CHLORIDE: 99 mmol/L (ref 96–112)
CO2: 25 mmol/L (ref 19–32)
Calcium: 8.7 mg/dL (ref 8.4–10.5)
Creatinine, Ser: 0.37 mg/dL — ABNORMAL LOW (ref 0.50–1.35)
GFR calc Af Amer: >90 mL/min (ref >90)
GFR calc non Af Amer: >90 mL/min (ref >90)
Glucose, Bld: 95 mg/dL (ref 70–99)
POTASSIUM: 3.5 mmol/L (ref 3.5–5.1)
SODIUM: 135 mmol/L (ref 135–145)
Total Protein: 7.5 g/dL (ref 6.0–8.3)

## 2015-02-19 LAB — CBC WITH DIFFERENTIAL/PLATELET
Basophils Absolute: 0 10*3/uL (ref 0.0–0.1)
Basophils Relative: 0 % (ref 0–1)
EOS ABS: 0.2 10*3/uL (ref 0.0–0.7)
Eosinophils Relative: 2 % (ref 0–5)
HCT: 42 % (ref 39.0–52.0)
Hemoglobin: 14.2 g/dL (ref 13.0–17.0)
Lymphocytes Relative: 30 % (ref 12–46)
Lymphs Abs: 2.1 10*3/uL (ref 0.7–4.0)
MCH: 34.2 pg — ABNORMAL HIGH (ref 26.0–34.0)
MCHC: 33.8 g/dL (ref 30.0–36.0)
MCV: 101.2 fL — ABNORMAL HIGH (ref 78.0–100.0)
Monocytes Absolute: 0.9 10*3/uL (ref 0.1–1.0)
Monocytes Relative: 13 % — ABNORMAL HIGH (ref 3–12)
Neutro Abs: 3.8 10*3/uL (ref 1.7–7.7)
Neutrophils Relative %: 55 % (ref 43–77)
PLATELETS: 202 10*3/uL (ref 150–400)
RBC: 4.15 MIL/uL — ABNORMAL LOW (ref 4.22–5.81)
RDW: 12.8 % (ref 11.5–15.5)
WBC: 7 10*3/uL (ref 4.0–10.5)

## 2015-02-19 LAB — LIPASE, BLOOD: LIPASE: 64 U/L — AB (ref 11–59)

## 2015-02-19 MED ORDER — SODIUM CHLORIDE 0.9 % IV BOLUS (SEPSIS)
1000.0000 mL | Freq: Once | INTRAVENOUS | Status: AC
  Administered 2015-02-19: 1000 mL via INTRAVENOUS

## 2015-02-19 MED ORDER — HYDROMORPHONE HCL 1 MG/ML IJ SOLN
1.0000 mg | Freq: Once | INTRAMUSCULAR | Status: AC
  Administered 2015-02-19: 1 mg via INTRAVENOUS
  Filled 2015-02-19: qty 1

## 2015-02-19 MED ORDER — PANTOPRAZOLE SODIUM 40 MG IV SOLR
40.0000 mg | Freq: Once | INTRAVENOUS | Status: AC
  Administered 2015-02-19: 40 mg via INTRAVENOUS
  Filled 2015-02-19: qty 40

## 2015-02-19 MED ORDER — GI COCKTAIL ~~LOC~~
30.0000 mL | Freq: Once | ORAL | Status: AC
  Administered 2015-02-19: 30 mL via ORAL
  Filled 2015-02-19: qty 30

## 2015-02-19 NOTE — ED Provider Notes (Signed)
CSN: [id number]     Arrival date & time 02/19/15  0234 History   First MD Initiated Contact with Patient 02/19/15 [phone]     Chief Complaint  Patient presents with  . Abdominal Pain     (Consider location/radiation/quality/duration/timing/severity/associated sxs/prior Treatment) HPI Patient presents with one week of epigastric pain. Patient stating that he thinks his pancreas is flaring up. He has had no nausea or vomiting. He takes Percocet 10/325 with some relief. He denies any fever or chills. Pain does not radiate. Admits to continued excessive alcohol intake. Past Medical History  Diagnosis Date  . Pancreatitis   . Seizures   . Back pain   . ETOH abuse   . COPD (chronic obstructive pulmonary disease)   . SAH (subarachnoid hemorrhage)   . Macrocytosis 08/17/2011  . Bradycardia 08/17/2011  . Pneumonia   . GERD (gastroesophageal reflux disease)    Past Surgical History  Procedure Laterality Date  . Back surgery    . Septoplasty     Family History  Problem Relation Age of Onset  . Seizures Father   . Alcohol abuse Father   . Coronary artery disease Mother     deceased age 70  . Colon cancer Neg Hx    History  Substance Use Topics  . Smoking status: Current Every Day Smoker -- 1.00 packs/day for 20 years    Types: Cigarettes  . Smokeless tobacco: Never Used  . Alcohol Use: 25.2 oz/week    42 Cans of beer per week     Comment: daily, at least 12 beers a day     Review of Systems  Constitutional: Negative for fever and chills.  Respiratory: Negative for shortness of breath.   Cardiovascular: Negative for chest pain.  Gastrointestinal: Positive for abdominal pain. Negative for nausea, vomiting, diarrhea, constipation and blood in stool.  Musculoskeletal: Negative for back pain, neck pain and neck stiffness.  Skin: Negative for rash and wound.  Neurological: Negative for dizziness, weakness, light-headedness, numbness and headaches.  All other systems reviewed and  are negative.     Allergies  Review of patient's allergies indicates no known allergies.  Home Medications   Prior to Admission medications   Medication Sig Start Date End Date Taking? Authorizing Provider  albuterol (PROVENTIL HFA;VENTOLIN HFA) 108 (90 BASE) MCG/ACT inhaler Inhale 2 puffs into the lungs every 6 (six) hours as needed for wheezing or shortness of breath.   Yes Historical Provider, MD  albuterol (PROVENTIL) (2.5 MG/3ML) 0.083% nebulizer solution Take 3 mLs (2.5 mg total) by nebulization every 4 (four) hours as needed for wheezing or shortness of breath. 09/09/13  Yes Orpah Greek, MD  carbamazepine (TEGRETOL) 200 MG tablet Take 400 mg by mouth 2 (two) times daily.   Yes Historical Provider, MD  Fluticasone-Salmeterol (ADVAIR) 250-50 MCG/DOSE AEPB Inhale 1 puff into the lungs 2 (two) times daily.   Yes Historical Provider, MD  levETIRAcetam (KEPPRA) 500 MG tablet Take 500 mg by mouth 2 (two) times daily.    Yes Historical Provider, MD  oxyCODONE-acetaminophen (PERCOCET) 10-325 MG per tablet Take 1 tablet by mouth 4 (four) times daily.    Yes Historical Provider, MD  ibuprofen (ADVIL,MOTRIN) 800 MG tablet Take 1 tablet (800 mg total) by mouth 3 (three) times daily. Patient not taking: Reported on 12/26/2014 07/03/14   Britt Bottom, NP  pantoprazole (PROTONIX) 20 MG tablet Take 1 tablet (20 mg total) by mouth daily. Patient not taking: Reported on 12/26/2014 03/03/14   Carmin Muskrat,  MD  predniSONE (DELTASONE) 20 MG tablet Take 3 tablets (60 mg total) by mouth daily. 01/13/15   Davonna Belling, MD   BP 168/98 mmHg  Pulse 88  Temp(Src) 98.2 F (36.8 C) (Oral)  Resp 22  Ht 5\' 9"  (1.753 m)  Wt 128 lb (58.06 kg)  BMI 18.89 kg/m2  SpO2 100% Physical Exam  Constitutional: He is oriented to person, place, and time. He appears well-developed and well-nourished. No distress.  HENT:  Head: Normocephalic and atraumatic.  Mouth/Throat: Oropharynx is clear and moist.   Eyes: EOM are normal. Pupils are equal, round, and reactive to light.  Neck: Normal range of motion. Neck supple.  Cardiovascular: Normal rate and regular rhythm.   Pulmonary/Chest: Effort normal and breath sounds normal. No respiratory distress. He has no wheezes. He has no rales.  Abdominal: Soft. Bowel sounds are normal. He exhibits distension. He exhibits no mass. There is tenderness (Tenderness to palpation in the epigastric region). There is no rebound and no guarding.  Musculoskeletal: Normal range of motion. He exhibits no edema or tenderness.  Neurological: He is alert and oriented to person, place, and time.  Skin: Skin is warm and dry. No rash noted. No erythema.  Psychiatric: He has a normal mood and affect. His behavior is normal.  Nursing note and vitals reviewed.   ED Course  Procedures (including critical care time) Labs Review Labs Reviewed  CBC WITH DIFFERENTIAL/PLATELET - Abnormal; Notable for the following:    RBC 4.15 (*)    MCV 101.2 (*)    MCH 34.2 (*)    Monocytes Relative 13 (*)    All other components within normal limits  COMPREHENSIVE METABOLIC PANEL - Abnormal; Notable for the following:    BUN 5 (*)    Creatinine, Ser 0.37 (*)    All other components within normal limits  LIPASE, BLOOD - Abnormal; Notable for the following:    Lipase 64 (*)    All other components within normal limits    Imaging Review No results found.   EKG Interpretation None      MDM   Final diagnoses:  Chronic abdominal pain    Abd pain is improved after medication. Vital signs remained stable. Very mild elevation in lipase. Question acute flare of chronic pancreatitis versus alcoholic gastritis. The patient's been instructed he decrease his alcohol use and symptoms. He's been advised to make appointment to follow-up with his primary physician. Return precautions given. Imaging is not necessary at this point.    Julianne Rice, MD 02/19/15 [phone]

## 2015-02-19 NOTE — Discharge Instructions (Signed)
Use over-the-counter Maalox and Mylanta. Avoid ibuprofen and all NSAIDs. Take your protonix as previously prescribed. Follow-up with your primary physician and with a gastroenterologist. You need to decrease the amount of alcohol that you're drinking.   Gastritis, Adult Gastritis is soreness and swelling (inflammation) of the lining of the stomach. Gastritis can develop as a sudden onset (acute) or long-term (chronic) condition. If gastritis is not treated, it can lead to stomach bleeding and ulcers. CAUSES  Gastritis occurs when the stomach lining is weak or damaged. Digestive juices from the stomach then inflame the weakened stomach lining. The stomach lining may be weak or damaged due to viral or bacterial infections. One common bacterial infection is the Helicobacter pylori infection. Gastritis can also result from excessive alcohol consumption, taking certain medicines, or having too much acid in the stomach.  SYMPTOMS  In some cases, there are no symptoms. When symptoms are present, they may include:  Pain or a burning sensation in the upper abdomen.  Nausea.  Vomiting.  An uncomfortable feeling of fullness after eating. DIAGNOSIS  Your caregiver may suspect you have gastritis based on your symptoms and a physical exam. To determine the cause of your gastritis, your caregiver may perform the following:  Blood or stool tests to check for the H pylori bacterium.  Gastroscopy. A thin, flexible tube (endoscope) is passed down the esophagus and into the stomach. The endoscope has a light and camera on the end. Your caregiver uses the endoscope to view the inside of the stomach.  Taking a tissue sample (biopsy) from the stomach to examine under a microscope. TREATMENT  Depending on the cause of your gastritis, medicines may be prescribed. If you have a bacterial infection, such as an H pylori infection, antibiotics may be given. If your gastritis is caused by too much acid in the stomach,  H2 blockers or antacids may be given. Your caregiver may recommend that you stop taking aspirin, ibuprofen, or other nonsteroidal anti-inflammatory drugs (NSAIDs). HOME CARE INSTRUCTIONS  Only take over-the-counter or prescription medicines as directed by your caregiver.  If you were given antibiotic medicines, take them as directed. Finish them even if you start to feel better.  Drink enough fluids to keep your urine clear or pale yellow.  Avoid foods and drinks that make your symptoms worse, such as:  Caffeine or alcoholic drinks.  Chocolate.  Peppermint or mint flavorings.  Garlic and onions.  Spicy foods.  Citrus fruits, such as oranges, lemons, or limes.  Tomato-based foods such as sauce, chili, salsa, and pizza.  Fried and fatty foods.  Eat small, frequent meals instead of large meals. SEEK IMMEDIATE MEDICAL CARE IF:   You have black or dark red stools.  You vomit blood or material that looks like coffee grounds.  You are unable to keep fluids down.  Your abdominal pain gets worse.  You have a fever.  You do not feel better after 1 week.  You have any other questions or concerns. MAKE SURE YOU:  Understand these instructions.  Will watch your condition.  Will get help right away if you are not doing well or get worse. Document Released: 10/09/2001 Document Revised: 04/15/2012 Document Reviewed: 11/28/2011 [facility] Patient Information 2015 [place], [place]. This information is not intended to replace advice given to you by your health care provider. Make sure you discuss any questions you have with your health care provider.

## 2015-02-19 NOTE — ED Notes (Signed)
Pt reports abd pain x 1 week that is different than his pancreatitis pain.  Pt admits to drinking regularly

## 2015-02-19 NOTE — ED Notes (Signed)
Discharge instructions given, pt demonstrated teach back and verbal understanding. No concerns voiced.  

## 2015-02-19 NOTE — ED Notes (Signed)
Pt very concerned that he will not be admitted for pain and treatment for condition. States he has been admitted "everytime" he comes to the ER for this "problem". Medications given as ordered. NS bolus infusing.

## 2015-03-09 ENCOUNTER — Emergency Department (HOSPITAL_COMMUNITY): Payer: Medicaid Other

## 2015-03-09 ENCOUNTER — Emergency Department (HOSPITAL_COMMUNITY)
Admission: EM | Admit: 2015-03-09 | Discharge: 2015-03-09 | Disposition: A | Discharge status: Home / Self Care | Payer: Medicaid Other | Attending: Emergency Medicine | Admitting: Emergency Medicine

## 2015-03-09 ENCOUNTER — Encounter (HOSPITAL_COMMUNITY): Payer: Self-pay | Admitting: Emergency Medicine

## 2015-03-09 DIAGNOSIS — G40909 Epilepsy, unspecified, not intractable, without status epilepticus: Secondary | ICD-10-CM | POA: Diagnosis not present

## 2015-03-09 DIAGNOSIS — Z8701 Personal history of pneumonia (recurrent): Secondary | ICD-10-CM | POA: Insufficient documentation

## 2015-03-09 DIAGNOSIS — R1012 Left upper quadrant pain: Secondary | ICD-10-CM | POA: Diagnosis not present

## 2015-03-09 DIAGNOSIS — K219 Gastro-esophageal reflux disease without esophagitis: Secondary | ICD-10-CM | POA: Insufficient documentation

## 2015-03-09 DIAGNOSIS — Z8679 Personal history of other diseases of the circulatory system: Secondary | ICD-10-CM | POA: Insufficient documentation

## 2015-03-09 DIAGNOSIS — Z720 Tobacco use: Secondary | ICD-10-CM | POA: Diagnosis not present

## 2015-03-09 DIAGNOSIS — Z7951 Long term (current) use of inhaled steroids: Secondary | ICD-10-CM | POA: Insufficient documentation

## 2015-03-09 DIAGNOSIS — Z79899 Other long term (current) drug therapy: Secondary | ICD-10-CM | POA: Insufficient documentation

## 2015-03-09 DIAGNOSIS — R109 Unspecified abdominal pain: Secondary | ICD-10-CM

## 2015-03-09 DIAGNOSIS — Z862 Personal history of diseases of the blood and blood-forming organs and certain disorders involving the immune mechanism: Secondary | ICD-10-CM | POA: Insufficient documentation

## 2015-03-09 DIAGNOSIS — Z791 Long term (current) use of non-steroidal anti-inflammatories (NSAID): Secondary | ICD-10-CM | POA: Diagnosis not present

## 2015-03-09 DIAGNOSIS — J449 Chronic obstructive pulmonary disease, unspecified: Secondary | ICD-10-CM | POA: Diagnosis not present

## 2015-03-09 DIAGNOSIS — R52 Pain, unspecified: Secondary | ICD-10-CM

## 2015-03-09 DIAGNOSIS — R1013 Epigastric pain: Secondary | ICD-10-CM | POA: Insufficient documentation

## 2015-03-09 LAB — CBC WITH DIFFERENTIAL/PLATELET
BASOS ABS: 0 10*3/uL (ref 0.0–0.1)
BASOS PCT: 1 % (ref 0–1)
Eosinophils Absolute: 0.1 10*3/uL (ref 0.0–0.7)
Eosinophils Relative: 2 % (ref 0–5)
HEMATOCRIT: 38.5 % — AB (ref 39.0–52.0)
Hemoglobin: 13 g/dL (ref 13.0–17.0)
LYMPHS PCT: 28 % (ref 12–46)
Lymphs Abs: 1.9 10*3/uL (ref 0.7–4.0)
MCH: 34.1 pg — ABNORMAL HIGH (ref 26.0–34.0)
MCHC: 33.8 g/dL (ref 30.0–36.0)
MCV: 101 fL — AB (ref 78.0–100.0)
Monocytes Absolute: 1 10*3/uL (ref 0.1–1.0)
Monocytes Relative: 15 % — ABNORMAL HIGH (ref 3–12)
NEUTROS ABS: 3.7 10*3/uL (ref 1.7–7.7)
Neutrophils Relative %: 54 % (ref 43–77)
Platelets: 216 10*3/uL (ref 150–400)
RBC: 3.81 MIL/uL — ABNORMAL LOW (ref 4.22–5.81)
RDW: 13.4 % (ref 11.5–15.5)
WBC: 6.7 10*3/uL (ref 4.0–10.5)

## 2015-03-09 LAB — COMPREHENSIVE METABOLIC PANEL
ALBUMIN: 3.9 g/dL (ref 3.5–5.0)
ALK PHOS: 90 U/L (ref 38–126)
ALT: 16 U/L — ABNORMAL LOW (ref 17–63)
AST: 40 U/L (ref 15–41)
Anion gap: 9 (ref 5–15)
BILIRUBIN TOTAL: 0.2 mg/dL — AB (ref 0.3–1.2)
BUN: 7 mg/dL (ref 6–20)
CO2: 24 mmol/L (ref 22–32)
Calcium: 8 mg/dL — ABNORMAL LOW (ref 8.9–10.3)
Chloride: 102 mmol/L (ref 101–111)
Creatinine, Ser: 0.34 mg/dL — ABNORMAL LOW (ref 0.61–1.24)
GFR calc Af Amer: >60 mL/min (ref >60)
GFR calc non Af Amer: >60 mL/min (ref >60)
Glucose, Bld: 89 mg/dL (ref 70–99)
Potassium: 4 mmol/L (ref 3.5–5.1)
SODIUM: 135 mmol/L (ref 135–145)
Total Protein: 6.9 g/dL (ref 6.5–8.1)

## 2015-03-09 LAB — ETHANOL: ALCOHOL ETHYL (B): 194 mg/dL — AB (ref <5)

## 2015-03-09 LAB — LIPASE, BLOOD: LIPASE: 25 U/L (ref 22–51)

## 2015-03-09 MED ORDER — RANITIDINE HCL 150 MG PO CAPS: 150.0000 mg | ORAL_CAPSULE | Freq: Two times a day (BID) | ORAL | Status: DC

## 2015-03-09 MED ORDER — HYDROMORPHONE HCL 4 MG PO TABS: 4.0000 mg | ORAL_TABLET | ORAL | Status: DC | PRN

## 2015-03-09 MED ORDER — ONDANSETRON HCL 4 MG/2ML IJ SOLN
4.0000 mg | Freq: Once | INTRAMUSCULAR | Status: AC
  Administered 2015-03-09: 4 mg via INTRAVENOUS

## 2015-03-09 MED ORDER — HYDROMORPHONE HCL 1 MG/ML IJ SOLN
1.0000 mg | Freq: Once | INTRAMUSCULAR | Status: AC
  Administered 2015-03-09: 1 mg via INTRAVENOUS

## 2015-03-09 MED ORDER — ONDANSETRON HCL 4 MG/2ML IJ SOLN
INTRAMUSCULAR | Status: AC
  Filled 2015-03-09: qty 2

## 2015-03-09 MED ORDER — HYDROMORPHONE HCL 1 MG/ML IJ SOLN
INTRAMUSCULAR | Status: AC
  Filled 2015-03-09: qty 1

## 2015-03-09 MED ORDER — IOHEXOL 300 MG/ML  SOLN
25.0000 mL | Freq: Once | INTRAMUSCULAR | Status: AC | PRN
  Administered 2015-03-09: 25 mL via ORAL

## 2015-03-09 MED ORDER — SODIUM CHLORIDE 0.9 % IV BOLUS (SEPSIS)
1000.0000 mL | Freq: Once | INTRAVENOUS | Status: AC
  Administered 2015-03-09: 1000 mL via INTRAVENOUS

## 2015-03-09 MED ORDER — HYDROMORPHONE HCL 1 MG/ML IJ SOLN
1.0000 mg | Freq: Once | INTRAMUSCULAR | Status: AC
  Administered 2015-03-09: 1 mg via INTRAVENOUS
  Filled 2015-03-09: qty 1

## 2015-03-09 MED ORDER — IOHEXOL 300 MG/ML  SOLN
100.0000 mL | Freq: Once | INTRAMUSCULAR | Status: AC | PRN
  Administered 2015-03-09: 100 mL via INTRAVENOUS

## 2015-03-09 MED ORDER — PROMETHAZINE HCL 25 MG PO TABS: 25.0000 mg | ORAL_TABLET | Freq: Four times a day (QID) | ORAL | Status: DC | PRN

## 2015-03-09 NOTE — Discharge Instructions (Signed)
Stop drinking alcohol.  Return if you want to stay in the hospital. Other wise follow up with your md

## 2015-03-09 NOTE — ED Provider Notes (Signed)
CSN: [id number]     Arrival date & time 03/09/15  1052 History  This chart was scribed for Milton Ferguson, MD by Erling Conte, ED Scribe. This patient was seen in room APA10/APA10 and the patient's care was started at 11:17 AM.    Chief Complaint  Patient presents with  . Abdominal Pain    Patient is a 39 y.o. male presenting with abdominal pain. The history is provided by the patient. No language interpreter was used.  Abdominal Pain Pain location:  LUQ Pain radiates to:  Does not radiate Onset quality:  Gradual Duration:  4 days Timing:  Constant Progression:  Waxing and waning Chronicity:  New Context comment:  H/o pancreatitis Relieved by:  Nothing Worsened by:  Nothing tried Ineffective treatments: Percocet. Associated symptoms: no chest pain, no cough, no diarrhea, no fatigue, no hematochezia, no hematuria, no nausea and no vomiting   Risk factors: alcohol abuse      HPI Comments: [name] is a 39 y.o. male with a h/o pancreatitis who presents to the Emergency Department complaining of constant, LUQ, moderate, abdominal pain for 4 days. 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 Pt took Percocet with no releif which he is prescribed by his PCP, Dr. Cindie Laroche. Pt has a h/o ETOH abuse and states his last alcohol use was this morning. He denies vomiting, diarrhea, or hematochezia.   Past Medical History  Diagnosis Date  . Pancreatitis   . Seizures   . Back pain   . ETOH abuse   . COPD (chronic obstructive pulmonary disease)   . SAH (subarachnoid hemorrhage)   . Macrocytosis 08/17/2011  . Bradycardia 08/17/2011  . Pneumonia   . GERD (gastroesophageal reflux disease)    Past Surgical History  Procedure Laterality Date  . Back surgery    . Septoplasty     Family History  Problem Relation Age of Onset  . Seizures Father   . Alcohol abuse Father   . Coronary artery disease Mother     deceased age 74  . Colon cancer Neg Hx    History  Substance Use Topics   . Smoking status: Current Every Day Smoker -- 1.00 packs/day for 20 years    Types: Cigarettes  . Smokeless tobacco: Never Used  . Alcohol Use: 25.2 oz/week    42 Cans of beer per week     Comment: daily, at least 18 beers a day     Review of Systems  Constitutional: Negative for appetite change and fatigue.  HENT: Negative for congestion, ear discharge and sinus pressure.   Eyes: Negative for discharge.  Respiratory: Negative for cough.   Cardiovascular: Negative for chest pain.  Gastrointestinal: Positive for abdominal pain. Negative for nausea, vomiting, diarrhea and hematochezia.  Genitourinary: Negative for frequency and hematuria.  Musculoskeletal: Negative for back pain.  Skin: Negative for rash.  Neurological: Negative for seizures and headaches.  Psychiatric/Behavioral: Negative for hallucinations.      Allergies  Review of patient's allergies indicates no known allergies.  Home Medications   Prior to Admission medications   Medication Sig Start Date End Date Taking? Authorizing Provider  albuterol (PROVENTIL HFA;VENTOLIN HFA) 108 (90 BASE) MCG/ACT inhaler Inhale 2 puffs into the lungs every 6 (six) hours as needed for wheezing or shortness of breath.   Yes Historical Provider, MD  albuterol (PROVENTIL) (2.5 MG/3ML) 0.083% nebulizer solution Take 3 mLs (2.5 mg total) by nebulization every 4 (four) hours as needed for wheezing or shortness of breath.  09/09/13  Yes Orpah Greek, MD  carbamazepine (TEGRETOL) 200 MG tablet Take 400 mg by mouth 2 (two) times daily.   Yes Historical Provider, MD  Fluticasone-Salmeterol (ADVAIR) 250-50 MCG/DOSE AEPB Inhale 1 puff into the lungs 2 (two) times daily.   Yes Historical Provider, MD  levETIRAcetam (KEPPRA) 500 MG tablet Take 500 mg by mouth 2 (two) times daily.    Yes Historical Provider, MD  oxyCODONE-acetaminophen (PERCOCET) 10-325 MG per tablet Take 1 tablet by mouth 4 (four) times daily.    Yes Historical Provider, MD   ibuprofen (ADVIL,MOTRIN) 800 MG tablet Take 1 tablet (800 mg total) by mouth 3 (three) times daily. Patient not taking: Reported on 12/26/2014 07/03/14   Britt Bottom, NP  pantoprazole (PROTONIX) 20 MG tablet Take 1 tablet (20 mg total) by mouth daily. Patient not taking: Reported on 12/26/2014 03/03/14   Carmin Muskrat, MD  predniSONE (DELTASONE) 20 MG tablet Take 3 tablets (60 mg total) by mouth daily. Patient not taking: Reported on 03/09/2015 01/13/15   Davonna Belling, MD   Triage Vitals: BP 175/97 mmHg  Pulse 80  Temp(Src) 98.1 F (36.7 C) (Oral)  Resp 18  Ht 5\' 9"  (1.753 m)  Wt 128 lb (58.06 kg)  BMI 18.89 kg/m2  SpO2 100%  Physical Exam  Constitutional: He is oriented to person, place, and time. He appears well-developed.  HENT:  Head: Normocephalic.  Eyes: Conjunctivae and EOM are normal. No scleral icterus.  Neck: Neck supple. No thyromegaly present.  Cardiovascular: Normal rate and regular rhythm.  Exam reveals no gallop and no friction rub.   No murmur heard. Pulmonary/Chest: No stridor. He has no wheezes. He has no rales. He exhibits no tenderness.  Abdominal: He exhibits no distension. There is tenderness (moderate) in the epigastric area. There is no rebound.  Musculoskeletal: Normal range of motion. He exhibits no edema.  Lymphadenopathy:    He has no cervical adenopathy.  Neurological: He is oriented to person, place, and time. He exhibits normal muscle tone. Coordination normal.  Skin: No rash noted. No erythema.  Psychiatric: He has a normal mood and affect. His behavior is normal.    ED Course  Procedures (including critical care time)  DIAGNOSTIC STUDIES: Oxygen Saturation is 100% on RA, normal by my interpretation.    COORDINATION OF CARE:    Labs Review Labs Reviewed  CBC WITH DIFFERENTIAL/PLATELET - Abnormal; Notable for the following:    RBC 3.81 (*)    HCT 38.5 (*)    MCV 101.0 (*)    MCH 34.1 (*)    Monocytes Relative 15 (*)    All  other components within normal limits  COMPREHENSIVE METABOLIC PANEL - Abnormal; Notable for the following:    Creatinine, Ser 0.34 (*)    Calcium 8.0 (*)    ALT 16 (*)    Total Bilirubin 0.2 (*)    All other components within normal limits  LIPASE, BLOOD  ETHANOL    Imaging Review Ct Abdomen Pelvis W Contrast  03/09/2015   CLINICAL DATA:  Left-sided abdominal pain for 4 days  EXAM: CT ABDOMEN AND PELVIS WITH CONTRAST  TECHNIQUE: Multidetector CT imaging of the abdomen and pelvis was performed using the standard protocol following bolus administration of intravenous contrast.  CONTRAST:  104mL OMNIPAQUE IOHEXOL 300 MG/ML SOLN, 137mL OMNIPAQUE IOHEXOL 300 MG/ML SOLN  COMPARISON:  12/26/2014  FINDINGS: The lung bases are free of acute infiltrate or sizable effusion.  The liver, gallbladder, spleen and adrenal glands are  within normal limits. The body and tail of the pancreas are unremarkable.  The pancreatic head demonstrates multiple calcifications consistent with prior chronic pancreatitis. Additionally there is a dominant cystic area identified measuring 2.1 cm. The overall appearance is stable from the prior exam. This may represent some acute on chronic pancreatitis. Some surrounding small lymph nodes are seen but stable.  The kidneys demonstrate a normal enhancement pattern. A tiny lower pole left renal cyst is seen.  Aortoiliac calcifications are seen. The appendix is within normal limits. Diverticular change of the colon is noted. The bladder is well distended. No pelvic mass lesion is noted. The osseous structures show no acute abnormality.  IMPRESSION: Stable appearing enlargement of the pancreatic head with a cystic lesion within. This again may be related to underlying pancreatitis. Evaluation by means of MRI is recommended when the patient's condition improves and optimum imaging can be performed.  Chronic changes without acute abnormality   Electronically Signed   By: Inez Catalina M.D.   On:  03/09/2015 12:43     EKG Interpretation None      MDM   Final diagnoses:  Pain   Pancreatitis, pseudocyst, abd pain.  Pt refused admission.     Told to stop drinking.  Rx,  Dilaudid, zantac, phenergan.  He is to follow up with his pcp or return to er   The chart was scribed for me under my direct supervision.  I personally performed the history, physical, and medical decision making and all procedures in the evaluation of this patient.Milton Ferguson, MD 03/09/15 [phone]

## 2015-03-09 NOTE — ED Notes (Signed)
Pt states that he has been having abdominal pain for a while now.  States has pancreatitis chronically and continues to drink between a 18 and 24pk of beer daily.

## 2015-03-22 ENCOUNTER — Emergency Department (HOSPITAL_COMMUNITY)
Admission: EM | Admit: 2015-03-22 | Discharge: 2015-03-22 | Disposition: A | Discharge status: Home / Self Care | Payer: Medicaid Other | Attending: Emergency Medicine | Admitting: Emergency Medicine

## 2015-03-22 ENCOUNTER — Encounter (HOSPITAL_COMMUNITY): Payer: Self-pay | Admitting: Cardiology

## 2015-03-22 DIAGNOSIS — R110 Nausea: Secondary | ICD-10-CM | POA: Insufficient documentation

## 2015-03-22 DIAGNOSIS — J449 Chronic obstructive pulmonary disease, unspecified: Secondary | ICD-10-CM | POA: Diagnosis not present

## 2015-03-22 DIAGNOSIS — Z79899 Other long term (current) drug therapy: Secondary | ICD-10-CM | POA: Insufficient documentation

## 2015-03-22 DIAGNOSIS — G40909 Epilepsy, unspecified, not intractable, without status epilepticus: Secondary | ICD-10-CM | POA: Diagnosis not present

## 2015-03-22 DIAGNOSIS — Z862 Personal history of diseases of the blood and blood-forming organs and certain disorders involving the immune mechanism: Secondary | ICD-10-CM | POA: Diagnosis not present

## 2015-03-22 DIAGNOSIS — Z7951 Long term (current) use of inhaled steroids: Secondary | ICD-10-CM | POA: Diagnosis not present

## 2015-03-22 DIAGNOSIS — R1013 Epigastric pain: Secondary | ICD-10-CM | POA: Insufficient documentation

## 2015-03-22 DIAGNOSIS — Z720 Tobacco use: Secondary | ICD-10-CM | POA: Insufficient documentation

## 2015-03-22 DIAGNOSIS — Z8701 Personal history of pneumonia (recurrent): Secondary | ICD-10-CM | POA: Insufficient documentation

## 2015-03-22 DIAGNOSIS — R109 Unspecified abdominal pain: Secondary | ICD-10-CM | POA: Diagnosis present

## 2015-03-22 DIAGNOSIS — K219 Gastro-esophageal reflux disease without esophagitis: Secondary | ICD-10-CM | POA: Diagnosis not present

## 2015-03-22 DIAGNOSIS — Z7952 Long term (current) use of systemic steroids: Secondary | ICD-10-CM | POA: Diagnosis not present

## 2015-03-22 DIAGNOSIS — Z8679 Personal history of other diseases of the circulatory system: Secondary | ICD-10-CM | POA: Insufficient documentation

## 2015-03-22 LAB — CBC WITH DIFFERENTIAL/PLATELET
Basophils Absolute: 0 K/uL (ref 0.0–0.1)
Basophils Relative: 1 % (ref 0–1)
Eosinophils Absolute: 0.1 K/uL (ref 0.0–0.7)
Eosinophils Relative: 2 % (ref 0–5)
HCT: 39.9 % (ref 39.0–52.0)
Hemoglobin: 13.8 g/dL (ref 13.0–17.0)
Lymphocytes Relative: 38 % (ref 12–46)
Lymphs Abs: 2.5 K/uL (ref 0.7–4.0)
MCH: 34.8 pg — ABNORMAL HIGH (ref 26.0–34.0)
MCHC: 34.6 g/dL (ref 30.0–36.0)
MCV: 100.5 fL — ABNORMAL HIGH (ref 78.0–100.0)
Monocytes Absolute: 0.6 K/uL (ref 0.1–1.0)
Monocytes Relative: 9 % (ref 3–12)
Neutro Abs: 3.4 K/uL (ref 1.7–7.7)
Neutrophils Relative %: 50 % (ref 43–77)
Platelets: 335 K/uL (ref 150–400)
RBC: 3.97 MIL/uL — ABNORMAL LOW (ref 4.22–5.81)
RDW: 13.1 % (ref 11.5–15.5)
WBC: 6.6 K/uL (ref 4.0–10.5)

## 2015-03-22 LAB — COMPREHENSIVE METABOLIC PANEL WITH GFR
ALT: 30 U/L (ref 17–63)
AST: 57 U/L — ABNORMAL HIGH (ref 15–41)
Albumin: 4.1 g/dL (ref 3.5–5.0)
Alkaline Phosphatase: 118 U/L (ref 38–126)
Anion gap: 10 (ref 5–15)
BUN: <5 mg/dL — ABNORMAL LOW (ref 6–20)
CO2: 27 mmol/L (ref 22–32)
Calcium: 9.1 mg/dL (ref 8.9–10.3)
Chloride: 93 mmol/L — ABNORMAL LOW (ref 101–111)
Creatinine, Ser: 0.4 mg/dL — ABNORMAL LOW (ref 0.61–1.24)
GFR calc Af Amer: >60 mL/min
GFR calc non Af Amer: >60 mL/min
Glucose, Bld: 155 mg/dL — ABNORMAL HIGH (ref 65–99)
Potassium: 4.6 mmol/L (ref 3.5–5.1)
Sodium: 130 mmol/L — ABNORMAL LOW (ref 135–145)
Total Bilirubin: 0.3 mg/dL (ref 0.3–1.2)
Total Protein: 8.1 g/dL (ref 6.5–8.1)

## 2015-03-22 MED ORDER — IBUPROFEN 800 MG PO TABS
800.0000 mg | ORAL_TABLET | Freq: Once | ORAL | Status: AC
  Administered 2015-03-22: 800 mg via ORAL
  Filled 2015-03-22: qty 1

## 2015-03-22 MED ORDER — ONDANSETRON 8 MG PO TBDP
8.0000 mg | ORAL_TABLET | Freq: Once | ORAL | Status: AC
  Administered 2015-03-22: 8 mg via ORAL
  Filled 2015-03-22: qty 1

## 2015-03-22 NOTE — ED Notes (Signed)
Right sided abdominal pain for 3 days.  States "its my pancreas".

## 2015-03-22 NOTE — ED Notes (Signed)
Pt made aware to return if symptoms worsen or if any life threatening symptoms occur.   

## 2015-03-22 NOTE — ED Notes (Signed)
Pt drinking water 

## 2015-03-22 NOTE — ED Provider Notes (Signed)
CSN: [id number]     Arrival date & time 03/22/15  1231 History   First MD Initiated Contact with Patient 03/22/15 1510     Chief Complaint  Patient presents with  . Abdominal Pain     Patient is a 39 y.o. male presenting with abdominal pain. The history is provided by the patient.  Abdominal Pain Pain location:  Epigastric Pain quality: sharp   Pain radiates to:  Does not radiate Pain severity:  Moderate Onset quality:  Gradual Duration:  3 days Timing:  Constant Progression:  Worsening Chronicity:  Recurrent Context: alcohol use   Relieved by:  Nothing Worsened by:  Movement and palpation Associated symptoms: nausea   Associated symptoms: no chest pain, no fever, no hematemesis, no hematochezia, no melena and no vomiting   pt reports onset of epigastric pain similar to prior pancreatitis He admits to recent ETOH use, last use this morning   Past Medical History  Diagnosis Date  . Pancreatitis   . Seizures   . Back pain   . ETOH abuse   . COPD (chronic obstructive pulmonary disease)   . SAH (subarachnoid hemorrhage)   . Macrocytosis 08/17/2011  . Bradycardia 08/17/2011  . Pneumonia   . GERD (gastroesophageal reflux disease)    Past Surgical History  Procedure Laterality Date  . Back surgery    . Septoplasty     Family History  Problem Relation Age of Onset  . Seizures Father   . Alcohol abuse Father   . Coronary artery disease Mother     deceased age 4  . Colon cancer Neg Hx    History  Substance Use Topics  . Smoking status: Current Every Day Smoker -- 1.00 packs/day for 20 years    Types: Cigarettes  . Smokeless tobacco: Never Used  . Alcohol Use: 25.2 oz/week    42 Cans of beer per week     Comment: daily, at least 18 beers a day     Review of Systems  Constitutional: Negative for fever.  Cardiovascular: Negative for chest pain.  Gastrointestinal: Positive for nausea and abdominal pain. Negative for vomiting, blood in stool, melena, hematochezia  and hematemesis.  All other systems reviewed and are negative.     Allergies  Review of patient's allergies indicates no known allergies.  Home Medications   Prior to Admission medications   Medication Sig Start Date End Date Taking? Authorizing Provider  albuterol (PROVENTIL HFA;VENTOLIN HFA) 108 (90 BASE) MCG/ACT inhaler Inhale 2 puffs into the lungs every 6 (six) hours as needed for wheezing or shortness of breath.    Historical Provider, MD  albuterol (PROVENTIL) (2.5 MG/3ML) 0.083% nebulizer solution Take 3 mLs (2.5 mg total) by nebulization every 4 (four) hours as needed for wheezing or shortness of breath. 09/09/13   Orpah Greek, MD  carbamazepine (TEGRETOL) 200 MG tablet Take 400 mg by mouth 2 (two) times daily.    Historical Provider, MD  Fluticasone-Salmeterol (ADVAIR) 250-50 MCG/DOSE AEPB Inhale 1 puff into the lungs 2 (two) times daily.    Historical Provider, MD  HYDROmorphone (DILAUDID) 4 MG tablet Take 1 tablet (4 mg total) by mouth every 4 (four) hours as needed for severe pain. 03/09/15   Milton Ferguson, MD  ibuprofen (ADVIL,MOTRIN) 800 MG tablet Take 1 tablet (800 mg total) by mouth 3 (three) times daily. Patient not taking: Reported on 12/26/2014 07/03/14   Britt Bottom, NP  levETIRAcetam (KEPPRA) 500 MG tablet Take 500 mg by mouth 2 (two) times  daily.     Historical Provider, MD  oxyCODONE-acetaminophen (PERCOCET) 10-325 MG per tablet Take 1 tablet by mouth 4 (four) times daily.     Historical Provider, MD  pantoprazole (PROTONIX) 20 MG tablet Take 1 tablet (20 mg total) by mouth daily. Patient not taking: Reported on 12/26/2014 03/03/14   Carmin Muskrat, MD  predniSONE (DELTASONE) 20 MG tablet Take 3 tablets (60 mg total) by mouth daily. Patient not taking: Reported on 03/09/2015 01/13/15   Davonna Belling, MD  promethazine (PHENERGAN) 25 MG tablet Take 1 tablet (25 mg total) by mouth every 6 (six) hours as needed for nausea or vomiting. 03/09/15   Milton Ferguson,  MD  ranitidine (ZANTAC) 150 MG capsule Take 1 capsule (150 mg total) by mouth 2 (two) times daily. 03/09/15   Milton Ferguson, MD   BP 124/80 mmHg  Pulse 84  Temp(Src) 98.2 F (36.8 C) (Oral)  Resp 16  Ht 5\' 9"  (1.753 m)  Wt 130 lb (58.968 kg)  BMI 19.19 kg/m2  SpO2 99% Physical Exam CONSTITUTIONAL: Well developed/well nourished HEAD: Normocephalic/atraumatic EYES: EOMI/PERRL, no icterus ENMT: Mucous membranes moist NECK: supple no meningeal signs SPINE/BACK:entire spine nontender CV: S1/S2 noted, no murmurs/rubs/gallops noted LUNGS: Lungs are clear to auscultation bilaterally, no apparent distress ABDOMEN: soft, mild epigastric tenderness, no rebound or guarding, bowel sounds noted throughout abdomen GU:no cva tenderness NEURO: Pt is awake/alert/appropriate, moves all extremitiesx4.  No facial droop.   EXTREMITIES: pulses normal/equal, full ROM SKIN: warm, color normal PSYCH: no abnormalities of mood noted, alert and oriented to situation  ED Course  Procedures   Pt with CT scan earlier this month that was at baseline He is well appearing, no distress He is taking PO Advised need for GI followup for his chronic pancreatitis   Medications  ondansetron (ZOFRAN-ODT) disintegrating tablet 8 mg (8 mg Oral Given 03/22/15 1534)  ibuprofen (ADVIL,MOTRIN) tablet 800 mg (800 mg Oral Given 03/22/15 1534)    Labs Review Labs Reviewed  CBC WITH DIFFERENTIAL/PLATELET - Abnormal; Notable for the following:    RBC 3.97 (*)    MCV 100.5 (*)    MCH 34.8 (*)    All other components within normal limits  COMPREHENSIVE METABOLIC PANEL - Abnormal; Notable for the following:    Sodium 130 (*)    Chloride 93 (*)    Glucose, Bld 155 (*)    BUN <5 (*)    Creatinine, Ser 0.40 (*)    AST 57 (*)    All other components within normal limits     MDM   Final diagnoses:  Epigastric abdominal pain    Nursing notes including past medical history and social history reviewed and considered  in documentation Labs/vital reviewed myself and considered during evaluation     Ripley Fraise, MD 03/22/15 [phone]

## 2015-04-17 ENCOUNTER — Encounter (HOSPITAL_COMMUNITY): Payer: Self-pay | Admitting: Emergency Medicine

## 2015-04-17 ENCOUNTER — Emergency Department (HOSPITAL_COMMUNITY)
Admission: EM | Admit: 2015-04-17 | Discharge: 2015-04-17 | Disposition: A | Discharge status: Home / Self Care | Payer: Medicaid Other | Attending: Emergency Medicine | Admitting: Emergency Medicine

## 2015-04-17 DIAGNOSIS — G40909 Epilepsy, unspecified, not intractable, without status epilepticus: Secondary | ICD-10-CM | POA: Insufficient documentation

## 2015-04-17 DIAGNOSIS — R1013 Epigastric pain: Secondary | ICD-10-CM | POA: Diagnosis not present

## 2015-04-17 DIAGNOSIS — Z8679 Personal history of other diseases of the circulatory system: Secondary | ICD-10-CM | POA: Insufficient documentation

## 2015-04-17 DIAGNOSIS — K219 Gastro-esophageal reflux disease without esophagitis: Secondary | ICD-10-CM | POA: Diagnosis not present

## 2015-04-17 DIAGNOSIS — G8929 Other chronic pain: Secondary | ICD-10-CM | POA: Diagnosis not present

## 2015-04-17 DIAGNOSIS — Z862 Personal history of diseases of the blood and blood-forming organs and certain disorders involving the immune mechanism: Secondary | ICD-10-CM | POA: Diagnosis not present

## 2015-04-17 DIAGNOSIS — J449 Chronic obstructive pulmonary disease, unspecified: Secondary | ICD-10-CM | POA: Insufficient documentation

## 2015-04-17 DIAGNOSIS — Z720 Tobacco use: Secondary | ICD-10-CM | POA: Diagnosis not present

## 2015-04-17 DIAGNOSIS — F1010 Alcohol abuse, uncomplicated: Secondary | ICD-10-CM | POA: Diagnosis not present

## 2015-04-17 DIAGNOSIS — Z79899 Other long term (current) drug therapy: Secondary | ICD-10-CM | POA: Insufficient documentation

## 2015-04-17 DIAGNOSIS — Z8701 Personal history of pneumonia (recurrent): Secondary | ICD-10-CM | POA: Insufficient documentation

## 2015-04-17 DIAGNOSIS — R109 Unspecified abdominal pain: Secondary | ICD-10-CM

## 2015-04-17 LAB — CBC WITH DIFFERENTIAL/PLATELET
BASOS PCT: 0 % (ref 0–1)
Basophils Absolute: 0 10*3/uL (ref 0.0–0.1)
Eosinophils Absolute: 0.2 10*3/uL (ref 0.0–0.7)
Eosinophils Relative: 3 % (ref 0–5)
HCT: 38.3 % — ABNORMAL LOW (ref 39.0–52.0)
HEMOGLOBIN: 13.4 g/dL (ref 13.0–17.0)
LYMPHS PCT: 32 % (ref 12–46)
Lymphs Abs: 2 10*3/uL (ref 0.7–4.0)
MCH: 34.8 pg — AB (ref 26.0–34.0)
MCHC: 35 g/dL (ref 30.0–36.0)
MCV: 99.5 fL (ref 78.0–100.0)
MONOS PCT: 10 % (ref 3–12)
Monocytes Absolute: 0.6 10*3/uL (ref 0.1–1.0)
NEUTROS PCT: 55 % (ref 43–77)
Neutro Abs: 3.5 10*3/uL (ref 1.7–7.7)
Platelets: 127 10*3/uL — ABNORMAL LOW (ref 150–400)
RBC: 3.85 MIL/uL — ABNORMAL LOW (ref 4.22–5.81)
RDW: 12.7 % (ref 11.5–15.5)
WBC: 6.4 10*3/uL (ref 4.0–10.5)

## 2015-04-17 LAB — COMPREHENSIVE METABOLIC PANEL
ALK PHOS: 101 U/L (ref 38–126)
ALT: 74 U/L — ABNORMAL HIGH (ref 17–63)
ANION GAP: 11 (ref 5–15)
AST: 115 U/L — AB (ref 15–41)
Albumin: 4 g/dL (ref 3.5–5.0)
BUN: 8 mg/dL (ref 6–20)
CO2: 25 mmol/L (ref 22–32)
Calcium: 9.1 mg/dL (ref 8.9–10.3)
Chloride: 94 mmol/L — ABNORMAL LOW (ref 101–111)
Creatinine, Ser: 0.33 mg/dL — ABNORMAL LOW (ref 0.61–1.24)
Glucose, Bld: 125 mg/dL — ABNORMAL HIGH (ref 65–99)
POTASSIUM: 3.5 mmol/L (ref 3.5–5.1)
Sodium: 130 mmol/L — ABNORMAL LOW (ref 135–145)
TOTAL PROTEIN: 7.2 g/dL (ref 6.5–8.1)
Total Bilirubin: 0.6 mg/dL (ref 0.3–1.2)

## 2015-04-17 LAB — ETHANOL: Alcohol, Ethyl (B): <5 mg/dL (ref <5)

## 2015-04-17 LAB — LIPASE, BLOOD: Lipase: 146 U/L — ABNORMAL HIGH (ref 22–51)

## 2015-04-17 MED ORDER — FAMOTIDINE 20 MG PO TABS
20.0000 mg | ORAL_TABLET | Freq: Once | ORAL | Status: AC
  Administered 2015-04-17: 20 mg via ORAL
  Filled 2015-04-17: qty 1

## 2015-04-17 MED ORDER — GI COCKTAIL ~~LOC~~
30.0000 mL | Freq: Once | ORAL | Status: AC
  Administered 2015-04-17: 30 mL via ORAL
  Filled 2015-04-17: qty 30

## 2015-04-17 MED ORDER — FAMOTIDINE IN NACL 20-0.9 MG/50ML-% IV SOLN
20.0000 mg | Freq: Once | INTRAVENOUS | Status: DC
  Filled 2015-04-17: qty 50

## 2015-04-17 MED ORDER — DIPHENHYDRAMINE HCL 50 MG/ML IJ SOLN
25.0000 mg | Freq: Once | INTRAMUSCULAR | Status: DC
  Filled 2015-04-17: qty 1

## 2015-04-17 MED ORDER — PROMETHAZINE HCL 25 MG PO TABS: 25.0000 mg | ORAL_TABLET | Freq: Four times a day (QID) | ORAL | Status: DC | PRN

## 2015-04-17 MED ORDER — METOCLOPRAMIDE HCL 5 MG/ML IJ SOLN
10.0000 mg | Freq: Once | INTRAMUSCULAR | Status: DC
  Filled 2015-04-17: qty 2

## 2015-04-17 MED ORDER — SODIUM CHLORIDE 0.9 % IV BOLUS (SEPSIS): 1000.0000 mL | Freq: Once | INTRAVENOUS | Status: DC

## 2015-04-17 MED ORDER — METOCLOPRAMIDE HCL 5 MG/ML IJ SOLN
10.0000 mg | Freq: Once | INTRAMUSCULAR | Status: AC
  Administered 2015-04-17: 10 mg via INTRAMUSCULAR

## 2015-04-17 MED ORDER — DIPHENHYDRAMINE HCL 50 MG/ML IJ SOLN
25.0000 mg | Freq: Once | INTRAMUSCULAR | Status: AC
  Administered 2015-04-17: 25 mg via INTRAMUSCULAR

## 2015-04-17 NOTE — ED Provider Notes (Signed)
CSN: [id number]     Arrival date & time 04/17/15  7517 History   First MD Initiated Contact with Patient 04/17/15 0615     Chief Complaint  Patient presents with  . Abdominal Pain     (Consider location/radiation/quality/duration/timing/severity/associated sxs/prior Treatment) HPI patient has had chronic pancreatitis and abdominal pain for years. He states his abdominal pain flared up 4 days ago. He states he has sharp constant epigastric abdominal pain. He denies nausea, vomiting, diarrhea or constipation. He states he drinks 8 beers daily and he does not think alcohol is a problem for him. Patient is requesting dilaudid for pain.  PCP Dr Cindie Laroche  Past Medical History  Diagnosis Date  . Pancreatitis   . Seizures   . Back pain   . ETOH abuse   . COPD (chronic obstructive pulmonary disease)   . SAH (subarachnoid hemorrhage)   . Macrocytosis 08/17/2011  . Bradycardia 08/17/2011  . Pneumonia   . GERD (gastroesophageal reflux disease)    Past Surgical History  Procedure Laterality Date  . Back surgery    . Septoplasty     Family History  Problem Relation Age of Onset  . Seizures Father   . Alcohol abuse Father   . Coronary artery disease Mother     deceased age 34  . Colon cancer Neg Hx    History  Substance Use Topics  . Smoking status: Current Every Day Smoker -- 1.00 packs/day for 20 years    Types: Cigarettes  . Smokeless tobacco: Never Used  . Alcohol Use: 25.2 oz/week    42 Cans of beer per week     Comment: daily, at least 18 beers a day   smokes 1- 1/2 ppd Drinks 8 beers (12 ounces) daily Live with girlfriend On disability for seizures  Review of Systems  All other systems reviewed and are negative.     Allergies  Review of patient's allergies indicates no known allergies.  Home Medications   Prior to Admission medications   Medication Sig Start Date End Date Taking? Authorizing Provider  albuterol (PROVENTIL HFA;VENTOLIN HFA) 108 (90 BASE)  MCG/ACT inhaler Inhale 2 puffs into the lungs every 6 (six) hours as needed for wheezing or shortness of breath.   Yes Historical Provider, MD  albuterol (PROVENTIL) (2.5 MG/3ML) 0.083% nebulizer solution Take 3 mLs (2.5 mg total) by nebulization every 4 (four) hours as needed for wheezing or shortness of breath. 09/09/13  Yes Orpah Greek, MD  carbamazepine (TEGRETOL) 200 MG tablet Take 400 mg by mouth 2 (two) times daily.   Yes Historical Provider, MD  Fluticasone-Salmeterol (ADVAIR) 250-50 MCG/DOSE AEPB Inhale 1 puff into the lungs 2 (two) times daily.   Yes Historical Provider, MD  levETIRAcetam (KEPPRA) 500 MG tablet Take 500 mg by mouth 2 (two) times daily.    Yes Historical Provider, MD  oxyCODONE-acetaminophen (PERCOCET) 10-325 MG per tablet Take 1 tablet by mouth 4 (four) times daily.    Yes Historical Provider, MD  pantoprazole (PROTONIX) 20 MG tablet Take 1 tablet (20 mg total) by mouth daily. 03/03/14  Yes Carmin Muskrat, MD  ranitidine (ZANTAC) 150 MG capsule Take 1 capsule (150 mg total) by mouth 2 (two) times daily. 03/09/15  Yes Milton Ferguson, MD  HYDROmorphone (DILAUDID) 4 MG tablet Take 1 tablet (4 mg total) by mouth every 4 (four) hours as needed for severe pain. 03/09/15   Milton Ferguson, MD  ibuprofen (ADVIL,MOTRIN) 800 MG tablet Take 1 tablet (800 mg total) by mouth  3 (three) times daily. Patient not taking: Reported on 12/26/2014 07/03/14   Britt Bottom, NP  predniSONE (DELTASONE) 20 MG tablet Take 3 tablets (60 mg total) by mouth daily. Patient not taking: Reported on 03/09/2015 01/13/15   Davonna Belling, MD  promethazine (PHENERGAN) 25 MG tablet Take 1 tablet (25 mg total) by mouth every 6 (six) hours as needed for nausea or vomiting. 03/09/15   Milton Ferguson, MD   BP 140/82 mmHg  Pulse 79  Temp(Src) 97.8 F (36.6 C) (Oral)  Resp 16  Ht 5\' 9"  (1.753 m)  Wt 128 lb (58.06 kg)  BMI 18.89 kg/m2  SpO2 95%  Vital signs normal   Physical Exam  Constitutional: He  is oriented to person, place, and time.  Non-toxic appearance. He does not appear ill. No distress.  Thin male  HENT:  Head: Normocephalic and atraumatic.  Right Ear: External ear normal.  Left Ear: External ear normal.  Nose: Nose normal. No mucosal edema or rhinorrhea.  Mouth/Throat: Oropharynx is clear and moist and mucous membranes are normal. No dental abscesses or uvula swelling.  Eyes: Conjunctivae and EOM are normal. Pupils are equal, round, and reactive to light.  Neck: Normal range of motion and full passive range of motion without pain. Neck supple.  Cardiovascular: Normal rate, regular rhythm and normal heart sounds.  Exam reveals no gallop and no friction rub.   No murmur heard. Pulmonary/Chest: Effort normal and breath sounds normal. No respiratory distress. He has no wheezes. He has no rhonchi. He has no rales. He exhibits no tenderness and no crepitus.  Abdominal: Soft. Normal appearance and bowel sounds are normal. He exhibits no distension. There is tenderness in the epigastric area. There is no rebound and no guarding.    Musculoskeletal: Normal range of motion. He exhibits no edema or tenderness.  Moves all extremities well.   Neurological: He is alert and oriented to person, place, and time. He has normal strength. No cranial nerve deficit.  Skin: Skin is warm, dry and intact. No rash noted. No erythema. No pallor.  Psychiatric: He has a normal mood and affect. His speech is normal and behavior is normal. His mood appears not anxious.  Nursing note and vitals reviewed.   ED Course  Procedures (including critical care time)  Medications  gi cocktail (Maalox,Lidocaine,Donnatal) (30 mLs Oral Given 04/17/15 0737)  famotidine (PEPCID) tablet 20 mg (20 mg Oral Given 04/17/15 0736)  diphenhydrAMINE (BENADRYL) injection 25 mg (25 mg Intramuscular Given 04/17/15 0737)  metoCLOPramide (REGLAN) injection 10 mg (10 mg Intramuscular Given 04/17/15 0739)     Review of the [facility] shows patient gets #120 oxycodone 10/325 monthly from his PCP. S filled on May 19. Patient states he has not run out. He also got #20 dilaudid  4mg  tabs from our ED on May 11.  Pt was told he was not getting pain medications. Nurses were unable to get an IV started. He was given oral and IM medications.  Dr Rogene Houston will recheck at discharge.   Labs Review Results for orders placed or performed during the hospital encounter of 04/17/15  Comprehensive metabolic panel  Result Value Ref Range   Sodium 130 (L) 135 - 145 mmol/L   Potassium 3.5 3.5 - 5.1 mmol/L   Chloride 94 (L) 101 - 111 mmol/L   CO2 25 22 - 32 mmol/L   Glucose, Bld 125 (H) 65 - 99 mg/dL   BUN 8 6 - 20 mg/dL   Creatinine,  Ser 0.33 (L) 0.61 - 1.24 mg/dL   Calcium 9.1 8.9 - 10.3 mg/dL   Total Protein 7.2 6.5 - 8.1 g/dL   Albumin 4.0 3.5 - 5.0 g/dL   AST 115 (H) 15 - 41 U/L   ALT 74 (H) 17 - 63 U/L   Alkaline Phosphatase 101 38 - 126 U/L   Total Bilirubin 0.6 0.3 - 1.2 mg/dL   GFR calc non Af Amer >60 >60 mL/min   GFR calc Af Amer >60 >60 mL/min   Anion gap 11 5 - 15  CBC with Differential  Result Value Ref Range   WBC 6.4 4.0 - 10.5 K/uL   RBC 3.85 (L) 4.22 - 5.81 MIL/uL   Hemoglobin 13.4 13.0 - 17.0 g/dL   HCT 38.3 (L) 39.0 - 52.0 %   MCV 99.5 78.0 - 100.0 fL   MCH 34.8 (H) 26.0 - 34.0 pg   MCHC 35.0 30.0 - 36.0 g/dL   RDW 12.7 11.5 - 15.5 %   Platelets 127 (L) 150 - 400 K/uL   Neutrophils Relative % 55 43 - 77 %   Neutro Abs 3.5 1.7 - 7.7 K/uL   Lymphocytes Relative 32 12 - 46 %   Lymphs Abs 2.0 0.7 - 4.0 K/uL   Monocytes Relative 10 3 - 12 %   Monocytes Absolute 0.6 0.1 - 1.0 K/uL   Eosinophils Relative 3 0 - 5 %   Eosinophils Absolute 0.2 0.0 - 0.7 K/uL   Basophils Relative 0 0 - 1 %   Basophils Absolute 0.0 0.0 - 0.1 K/uL  Lipase, blood  Result Value Ref Range   Lipase 146 (H) 22 - 51 U/L  Ethanol  Result Value Ref Range   Alcohol, Ethyl (B) <5 <5 mg/dL   Laboratory interpretation  all normal except minor elevation of lipase, hyponatremia     Imaging Review No results found.    Ct Abdomen Pelvis W Contrast  03/31/2015   CLINICAL DATA:  Abdominal pain with diarrhea, nausea, and vomiting    IMPRESSION: No bowel wall thickening or obstruction. Appendix appears normal. No abscess.  Small nonobstructing calculus lower pole right kidney. No ureteral calculus on either side. No hydronephrosis.  Hepatic steatosis.   Electronically Signed   By: Lowella Grip III M.D.   On: 03/31/2015 10:02    EKG Interpretation None      MDM   Final diagnoses:  Chronic abdominal pain  Alcohol abuse    Plan discharge  Rolland Porter, MD, Barbette Or, MD 04/17/15 [phone]

## 2015-04-17 NOTE — ED Notes (Signed)
Multiple IV attempts made x2 Jacqulynn Cadet RN, x2 attempts made by Domenica Reamer RN, and x2 attempts made by Peyton Najjar RN. Patient refusing any more attempts. Dr Tomi Bamberger made aware, orders changed to PO and IM.

## 2015-04-17 NOTE — Discharge Instructions (Signed)
You will need to follow up with Dr Cindie Laroche about your chronic pain and your pain medications. YOU NEED TO STOP SMOKING AND TO STOP DRINKING ALCOHOL, this will help with your abdominal pain and your seizures.   Chronic Pain Discharge Instructions  Emergency care providers appreciate that many patients coming to [place] are in severe pain and we wish to address their pain in the safest, most responsible manner.  It is important to recognize however, that the proper treatment of chronic pain differs from that of the pain of injuries and acute illnesses.  Our goal is to provide quality, safe, personalized care and we thank you for giving [place] the opportunity to serve you. The use of narcotics and related agents for chronic pain syndromes may lead to additional physical and psychological problems.  Nearly as many people die from prescription narcotics each year as die from car crashes.  Additionally, this risk is increased if such prescriptions are obtained from a variety of sources.  Therefore, only your primary care physician or a pain management specialist is able to safely treat such syndromes with narcotic medications long-term.    Documentation revealing such prescriptions have been sought from multiple sources may prohibit [place] from providing a refill or different narcotic medication.  Your name may be checked first through the [facility].  This database is a record of controlled substance medication prescriptions that the patient has received.  This has been established by [facility] in an effort to eliminate the dangerous, and often life threatening, practice of obtaining multiple prescriptions from different medical providers.   If you have a chronic pain syndrome (i.e. chronic headaches, recurrent back or neck pain, dental pain, abdominal or pelvis pain without a specific diagnosis, or neuropathic pain such as fibromyalgia) or recurrent visits for the same  condition without an acute diagnosis, you may be treated with non-narcotics and other non-addictive medicines.  Allergic reactions or negative side effects that may be reported by a patient to such medications will not typically lead to the use of a narcotic analgesic or other controlled substance as an alternative.   Patients managing chronic pain with a personal physician should have provisions in place for breakthrough pain.  If you are in crisis, you should call your physician.  If your physician directs you to the emergency department, please have the doctor call and speak to our attending physician concerning your care.   When patients come to the Emergency Department (ED) with acute medical conditions in which the Emergency Department physician feels appropriate to prescribe narcotic or sedating pain medication, the physician will prescribe these in very limited quantities.  The amount of these medications will last only until you can see your primary care physician in his/her office.  Any patient who returns to the ED seeking refills should expect only non-narcotic pain medications.   In the event of an acute medical condition exists and the emergency physician feels it is necessary that the patient be given a narcotic or sedating medication -  a responsible adult driver should be present in the room prior to the medication being given by the nurse.   Prescriptions for narcotic or sedating medications that have been lost, stolen or expired will not be refilled in the Emergency Department.    Patients who have chronic pain may receive non-narcotic prescriptions until seen by their primary care physician.  It is every patients personal responsibility to maintain active prescriptions with his or her primary  care physician or specialist.

## 2015-04-17 NOTE — ED Notes (Signed)
Chronic abdominal pain, increased pain last 4 days, history of pancreatitis.

## 2015-07-18 ENCOUNTER — Emergency Department (HOSPITAL_COMMUNITY)
Admission: EM | Admit: 2015-07-18 | Discharge: 2015-07-18 | Disposition: A | Discharge status: Home / Self Care | Payer: Medicaid Other | Attending: Emergency Medicine | Admitting: Emergency Medicine

## 2015-07-18 ENCOUNTER — Encounter (HOSPITAL_COMMUNITY): Payer: Self-pay | Admitting: *Deleted

## 2015-07-18 DIAGNOSIS — F1010 Alcohol abuse, uncomplicated: Secondary | ICD-10-CM | POA: Insufficient documentation

## 2015-07-18 DIAGNOSIS — Z862 Personal history of diseases of the blood and blood-forming organs and certain disorders involving the immune mechanism: Secondary | ICD-10-CM | POA: Diagnosis not present

## 2015-07-18 DIAGNOSIS — K920 Hematemesis: Secondary | ICD-10-CM | POA: Diagnosis present

## 2015-07-18 DIAGNOSIS — F1210 Cannabis abuse, uncomplicated: Secondary | ICD-10-CM | POA: Insufficient documentation

## 2015-07-18 DIAGNOSIS — K219 Gastro-esophageal reflux disease without esophagitis: Secondary | ICD-10-CM | POA: Diagnosis not present

## 2015-07-18 DIAGNOSIS — Z7951 Long term (current) use of inhaled steroids: Secondary | ICD-10-CM | POA: Insufficient documentation

## 2015-07-18 DIAGNOSIS — G40909 Epilepsy, unspecified, not intractable, without status epilepticus: Secondary | ICD-10-CM | POA: Insufficient documentation

## 2015-07-18 DIAGNOSIS — Z8701 Personal history of pneumonia (recurrent): Secondary | ICD-10-CM | POA: Insufficient documentation

## 2015-07-18 DIAGNOSIS — Z720 Tobacco use: Secondary | ICD-10-CM | POA: Insufficient documentation

## 2015-07-18 DIAGNOSIS — Z79899 Other long term (current) drug therapy: Secondary | ICD-10-CM | POA: Insufficient documentation

## 2015-07-18 DIAGNOSIS — R1084 Generalized abdominal pain: Secondary | ICD-10-CM | POA: Insufficient documentation

## 2015-07-18 DIAGNOSIS — J449 Chronic obstructive pulmonary disease, unspecified: Secondary | ICD-10-CM | POA: Insufficient documentation

## 2015-07-18 DIAGNOSIS — Z8679 Personal history of other diseases of the circulatory system: Secondary | ICD-10-CM | POA: Diagnosis not present

## 2015-07-18 DIAGNOSIS — F1310 Sedative, hypnotic or anxiolytic abuse, uncomplicated: Secondary | ICD-10-CM | POA: Diagnosis not present

## 2015-07-18 LAB — COMPREHENSIVE METABOLIC PANEL
ALT: 18 U/L (ref 17–63)
ANION GAP: 11 (ref 5–15)
AST: 46 U/L — AB (ref 15–41)
Albumin: 4.2 g/dL (ref 3.5–5.0)
Alkaline Phosphatase: 105 U/L (ref 38–126)
BUN: 7 mg/dL (ref 6–20)
CO2: 24 mmol/L (ref 22–32)
Calcium: 8.3 mg/dL — ABNORMAL LOW (ref 8.9–10.3)
Chloride: 100 mmol/L — ABNORMAL LOW (ref 101–111)
Creatinine, Ser: <0.3 mg/dL — ABNORMAL LOW (ref 0.61–1.24)
Glucose, Bld: 97 mg/dL (ref 65–99)
Potassium: 3.5 mmol/L (ref 3.5–5.1)
Sodium: 135 mmol/L (ref 135–145)
TOTAL PROTEIN: 7.6 g/dL (ref 6.5–8.1)
Total Bilirubin: 0.2 mg/dL — ABNORMAL LOW (ref 0.3–1.2)

## 2015-07-18 LAB — ETHANOL: Alcohol, Ethyl (B): 55 mg/dL — ABNORMAL HIGH (ref <5)

## 2015-07-18 LAB — URINALYSIS, ROUTINE W REFLEX MICROSCOPIC
BILIRUBIN URINE: NEGATIVE
GLUCOSE, UA: NEGATIVE mg/dL
Hgb urine dipstick: NEGATIVE
KETONES UR: NEGATIVE mg/dL
Leukocytes, UA: NEGATIVE
Nitrite: NEGATIVE
PROTEIN: NEGATIVE mg/dL
Specific Gravity, Urine: 1.015 (ref 1.005–1.030)
UROBILINOGEN UA: 0.2 mg/dL (ref 0.0–1.0)
pH: 6 (ref 5.0–8.0)

## 2015-07-18 LAB — CBC WITH DIFFERENTIAL/PLATELET
BASOS PCT: 0 %
Basophils Absolute: 0 10*3/uL (ref 0.0–0.1)
EOS ABS: 0.2 10*3/uL (ref 0.0–0.7)
Eosinophils Relative: 2 %
HCT: 38.6 % — ABNORMAL LOW (ref 39.0–52.0)
Hemoglobin: 13.4 g/dL (ref 13.0–17.0)
Lymphocytes Relative: 19 %
Lymphs Abs: 1.9 10*3/uL (ref 0.7–4.0)
MCH: 35.8 pg — ABNORMAL HIGH (ref 26.0–34.0)
MCHC: 34.7 g/dL (ref 30.0–36.0)
MCV: 103.2 fL — ABNORMAL HIGH (ref 78.0–100.0)
MONO ABS: 0.9 10*3/uL (ref 0.1–1.0)
Monocytes Relative: 10 %
NEUTROS ABS: 6.9 10*3/uL (ref 1.7–7.7)
Neutrophils Relative %: 69 %
PLATELETS: 237 10*3/uL (ref 150–400)
RBC: 3.74 MIL/uL — ABNORMAL LOW (ref 4.22–5.81)
RDW: 12.3 % (ref 11.5–15.5)
WBC: 9.9 10*3/uL (ref 4.0–10.5)

## 2015-07-18 LAB — LIPASE, BLOOD: LIPASE: 633 U/L — AB (ref 22–51)

## 2015-07-18 LAB — RAPID URINE DRUG SCREEN, HOSP PERFORMED
Amphetamines: NOT DETECTED
Barbiturates: NOT DETECTED
Benzodiazepines: POSITIVE — AB
Cocaine: NOT DETECTED
OPIATES: NOT DETECTED
Tetrahydrocannabinol: POSITIVE — AB

## 2015-07-18 MED ORDER — ONDANSETRON 4 MG PO TBDP: 4.0000 mg | ORAL_TABLET | Freq: Three times a day (TID) | ORAL | Status: DC | PRN

## 2015-07-18 MED ORDER — FENTANYL CITRATE (PF) 100 MCG/2ML IJ SOLN: 25.0000 ug | Freq: Once | INTRAMUSCULAR | Status: DC

## 2015-07-18 MED ORDER — SODIUM CHLORIDE 0.9 % IV SOLN
1000.0000 mL | Freq: Once | INTRAVENOUS | Status: AC
  Administered 2015-07-18: 1000 mL via INTRAVENOUS

## 2015-07-18 MED ORDER — LORAZEPAM 2 MG/ML IJ SOLN
1.0000 mg | Freq: Once | INTRAMUSCULAR | Status: AC
  Administered 2015-07-18: 1 mg via INTRAVENOUS
  Filled 2015-07-18: qty 1

## 2015-07-18 MED ORDER — FAMOTIDINE IN NACL 20-0.9 MG/50ML-% IV SOLN
20.0000 mg | Freq: Once | INTRAVENOUS | Status: AC
  Administered 2015-07-18: 20 mg via INTRAVENOUS
  Filled 2015-07-18: qty 50

## 2015-07-18 MED ORDER — SODIUM CHLORIDE 0.9 % IV SOLN
1000.0000 mL | INTRAVENOUS | Status: DC
  Administered 2015-07-18: 1000 mL via INTRAVENOUS

## 2015-07-18 MED ORDER — OMEPRAZOLE 20 MG PO CPDR: DELAYED_RELEASE_CAPSULE | ORAL | Status: DC

## 2015-07-18 MED ORDER — FENTANYL CITRATE (PF) 100 MCG/2ML IJ SOLN
50.0000 ug | Freq: Once | INTRAMUSCULAR | Status: AC
  Administered 2015-07-18: 50 ug via INTRAVENOUS
  Filled 2015-07-18: qty 2

## 2015-07-18 MED ORDER — FAMOTIDINE IN NACL 20-0.9 MG/50ML-% IV SOLN
20.0000 mg | Freq: Once | INTRAVENOUS | Status: AC
  Administered 2015-07-18: 20 mg via INTRAVENOUS

## 2015-07-18 NOTE — ED Notes (Signed)
Pt given sprite to drink, tolerating well at this time.

## 2015-07-18 NOTE — ED Notes (Signed)
Pt states the "fenta-stuff will not help me I need the other stuff, the dila-lau-dial- something." EDP notified.

## 2015-07-18 NOTE — ED Notes (Signed)
Pt is sleeping at this time. Resting quietly without c/o pain or discomfort.

## 2015-07-18 NOTE — ED Notes (Signed)
Pt came to nurse asked to be admitted because "he could get his medicine". Pt stated the "doctor said I could be admitted, please tell her I want to stay." Pt had dislodged his IV, Nurse removed it at that time. notified EDP of pt statements.

## 2015-07-18 NOTE — ED Provider Notes (Signed)
CSN: [id number]     Arrival date & time 07/18/15  0306 History   First MD Initiated Contact with Patient 07/18/15 0325     Chief Complaint  Patient presents with  . Hematemesis     (Consider location/radiation/quality/duration/timing/severity/associated sxs/prior Treatment) HPI patient has a history of alcohol abuse and pancreatitis. He reports he has had about a 6 pack today. He was awakened from sleep about an hour prior to arrival with chest pain that radiated down into his abdomen diffusely. The pain is sharp and constant. He states he's had nausea and vomiting. Nothing makes the pain worse, nothing makes it feel better. He states he was vomiting blood in the waiting room of the ED. However that is not documented by nursing staff. He states he's having normal bowel movements. Patient states he has not run out of his pain medicine and he did not take his pain medicine this morning.   PCP Dr. Cindie Laroche  Past Medical History  Diagnosis Date  . Pancreatitis   . Seizures   . Back pain   . ETOH abuse   . COPD (chronic obstructive pulmonary disease)   . SAH (subarachnoid hemorrhage)   . Macrocytosis 08/17/2011  . Bradycardia 08/17/2011  . Pneumonia   . GERD (gastroesophageal reflux disease)    Past Surgical History  Procedure Laterality Date  . Back surgery    . Septoplasty     Family History  Problem Relation Age of Onset  . Seizures Father   . Alcohol abuse Father   . Coronary artery disease Mother     deceased age 64  . Colon cancer Neg Hx    Social History  Substance Use Topics  . Smoking status: Current Every Day Smoker -- 1.00 packs/day for 20 years    Types: Cigarettes  . Smokeless tobacco: Never Used  . Alcohol Use: 25.2 oz/week    42 Cans of beer per week     Comment: daily, at least 18 beers a day    on disability for seizure disorder States he drinks 8-10 beers a day.  Review of Systems  All other systems reviewed and are negative.     Allergies   Review of patient's allergies indicates no known allergies.  Home Medications   Prior to Admission medications   Medication Sig Start Date End Date Taking? Authorizing Provider  albuterol (PROVENTIL HFA;VENTOLIN HFA) 108 (90 BASE) MCG/ACT inhaler Inhale 2 puffs into the lungs every 6 (six) hours as needed for wheezing or shortness of breath.    Historical Provider, MD  albuterol (PROVENTIL) (2.5 MG/3ML) 0.083% nebulizer solution Take 3 mLs (2.5 mg total) by nebulization every 4 (four) hours as needed for wheezing or shortness of breath. 09/09/13   Orpah Greek, MD  carbamazepine (TEGRETOL) 200 MG tablet Take 400 mg by mouth 2 (two) times daily.    Historical Provider, MD  Fluticasone-Salmeterol (ADVAIR) 250-50 MCG/DOSE AEPB Inhale 1 puff into the lungs 2 (two) times daily.    Historical Provider, MD  HYDROmorphone (DILAUDID) 4 MG tablet Take 1 tablet (4 mg total) by mouth every 4 (four) hours as needed for severe pain. 03/09/15   Milton Ferguson, MD  ibuprofen (ADVIL,MOTRIN) 800 MG tablet Take 1 tablet (800 mg total) by mouth 3 (three) times daily. Patient not taking: Reported on 12/26/2014 07/03/14   Britt Bottom, NP  levETIRAcetam (KEPPRA) 500 MG tablet Take 500 mg by mouth 2 (two) times daily.     Historical Provider, MD  omeprazole ([place])  20 MG capsule Take 1 po BID x 2 weeks then once a day 07/18/15   Rolland Porter, MD  ondansetron (ZOFRAN ODT) 4 MG disintegrating tablet Take 1 tablet (4 mg total) by mouth every 8 (eight) hours as needed for nausea or vomiting. 07/18/15   Rolland Porter, MD  oxyCODONE-acetaminophen (PERCOCET) 10-325 MG per tablet Take 1 tablet by mouth 4 (four) times daily.     Historical Provider, MD  pantoprazole (PROTONIX) 20 MG tablet Take 1 tablet (20 mg total) by mouth daily. 03/03/14   Carmin Muskrat, MD  predniSONE (DELTASONE) 20 MG tablet Take 3 tablets (60 mg total) by mouth daily. Patient not taking: Reported on 03/09/2015 01/13/15   Davonna Belling, MD   promethazine (PHENERGAN) 25 MG tablet Take 1 tablet (25 mg total) by mouth every 6 (six) hours as needed for nausea or vomiting. 03/09/15   Milton Ferguson, MD  promethazine (PHENERGAN) 25 MG tablet Take 1 tablet (25 mg total) by mouth every 6 (six) hours as needed. 04/17/15   Fredia Sorrow, MD  ranitidine (ZANTAC) 150 MG capsule Take 1 capsule (150 mg total) by mouth 2 (two) times daily. 03/09/15   Milton Ferguson, MD   BP 174/97 mmHg  Pulse 88  Temp(Src) 97.7 F (36.5 C) (Oral)  Resp 32  Ht 5\' 9"  (1.753 m)  Wt 125 lb (56.7 kg)  BMI 18.45 kg/m2  SpO2 100%  Vital signs normal except for hypertension  Physical Exam  Constitutional: He is oriented to person, place, and time.  Non-toxic appearance. He does not appear ill. No distress.  Small frail man  HENT:  Head: Normocephalic and atraumatic.  Right Ear: External ear normal.  Left Ear: External ear normal.  Nose: Nose normal. No mucosal edema or rhinorrhea.  Mouth/Throat: Oropharynx is clear and moist and mucous membranes are normal. No dental abscesses or uvula swelling.  Eyes: Conjunctivae and EOM are normal. Pupils are equal, round, and reactive to light.  Neck: Normal range of motion and full passive range of motion without pain. Neck supple.  Cardiovascular: Normal rate, regular rhythm and normal heart sounds.  Exam reveals no gallop and no friction rub.   No murmur heard. Pulmonary/Chest: Effort normal and breath sounds normal. No respiratory distress. He has no wheezes. He has no rhonchi. He has no rales. He exhibits no tenderness and no crepitus.  Abdominal: Soft. Normal appearance and bowel sounds are normal. He exhibits no distension. There is generalized tenderness. There is no rebound and no guarding.  Patient is gagging, there is nothing in his emesis bag.  Musculoskeletal: Normal range of motion. He exhibits no edema or tenderness.  Moves all extremities well.   Neurological: He is alert and oriented to person, place, and  time. He has normal strength. No cranial nerve deficit.  Skin: Skin is warm, dry and intact. No rash noted. No erythema. No pallor.  Psychiatric: He has a normal mood and affect. His speech is normal and behavior is normal. His mood appears not anxious.  Nursing note and vitals reviewed.   ED Course  Procedures (including critical care time)  Medications  0.9 %  sodium chloride infusion (0 mLs Intravenous Stopped 07/18/15 0623)    Followed by  0.9 %  sodium chloride infusion (0 mLs Intravenous Stopped 07/18/15 0537)    Followed by  0.9 %  sodium chloride infusion (0 mLs Intravenous Stopped 07/18/15 0536)  famotidine (PEPCID) IVPB 20 mg premix (0 mg Intravenous Stopped 07/18/15 0441)  LORazepam (ATIVAN)  injection 1 mg (1 mg Intravenous Given 07/18/15 0410)  fentaNYL (SUBLIMAZE) injection 50 mcg (50 mcg Intravenous Given 07/18/15 0411)  famotidine (PEPCID) IVPB 20 mg premix (0 mg Intravenous Stopped 07/18/15 0615)   Nursing staff reports seeing patient putting his fingers down his throat to make himself vomit.  05:15 Despite telling his nurse he would need something stronger than fentanyl for pain patient is quiet and sleeping.   05:40 pt snoring, I have reviewed his labs. He states his pain is gone. Will try some oral fluids and see how he does, despite the elevation of his lipase if he is able to tolerate oral fluids he will be able to be discharged. Last admission in March reviewed, pt c/o not getting enough pain medication and finally left AMA.   Recheck 6:15 AM patient has been drinking fluids and states it did not make him feel worse. States "I could've drank more". We discussed his discharge plan. Patient was willing to be discharged home to drink liquids. Patient should have plenty of pain medication at home. He was wanting me to write for more pain medication for him, which he should already have pain meds at home. However after I left the room he told the nurse that he wanted to be  admitted because he needs to get more pain medication.  07:10 Dr Caryn Section, we discussed patient, since his pain is controlled and he isn't vomiting anymore and he was willing to go home until I told him I wasn't going to give him any extra pain pills to take at home, despite his lipase being high, feels he can try outpatient treatment.   07:20 Patient asleep, snoring. Advised the hospitalist felt he could go home and he is agreeable.    Review of the [place] shows patient gets #120 oxycodone 10/325 monthly from his PCP. The last time filled was September 8. He also got #20 hydromorphone 4 mg tablets in May from one of our ED providers.   Labs Review Results for orders placed or performed during the hospital encounter of 07/18/15  Comprehensive metabolic panel  Result Value Ref Range   Sodium 135 135 - 145 mmol/L   Potassium 3.5 3.5 - 5.1 mmol/L   Chloride 100 (L) 101 - 111 mmol/L   CO2 24 22 - 32 mmol/L   Glucose, Bld 97 65 - 99 mg/dL   BUN 7 6 - 20 mg/dL   Creatinine, Ser <0.30 (L) 0.61 - 1.24 mg/dL   Calcium 8.3 (L) 8.9 - 10.3 mg/dL   Total Protein 7.6 6.5 - 8.1 g/dL   Albumin 4.2 3.5 - 5.0 g/dL   AST 46 (H) 15 - 41 U/L   ALT 18 17 - 63 U/L   Alkaline Phosphatase 105 38 - 126 U/L   Total Bilirubin 0.2 (L) 0.3 - 1.2 mg/dL   GFR calc non Af Amer NOT CALCULATED >60 mL/min   GFR calc Af Amer NOT CALCULATED >60 mL/min   Anion gap 11 5 - 15  Ethanol  Result Value Ref Range   Alcohol, Ethyl (B) 55 (H) <5 mg/dL  CBC with Differential  Result Value Ref Range   WBC 9.9 4.0 - 10.5 K/uL   RBC 3.74 (L) 4.22 - 5.81 MIL/uL   Hemoglobin 13.4 13.0 - 17.0 g/dL   HCT 38.6 (L) 39.0 - 52.0 %   MCV 103.2 (H) 78.0 - 100.0 fL   MCH 35.8 (H) 26.0 - 34.0 pg   MCHC 34.7 30.0 -  36.0 g/dL   RDW 12.3 11.5 - 15.5 %   Platelets 237 150 - 400 K/uL   Neutrophils Relative % 69 %   Neutro Abs 6.9 1.7 - 7.7 K/uL   Lymphocytes Relative 19 %   Lymphs Abs 1.9 0.7 - 4.0 K/uL   Monocytes  Relative 10 %   Monocytes Absolute 0.9 0.1 - 1.0 K/uL   Eosinophils Relative 2 %   Eosinophils Absolute 0.2 0.0 - 0.7 K/uL   Basophils Relative 0 %   Basophils Absolute 0.0 0.0 - 0.1 K/uL  Lipase, blood  Result Value Ref Range   Lipase 633 (H) 22 - 51 U/L  Urinalysis, Routine w reflex microscopic  Result Value Ref Range   Color, Urine YELLOW YELLOW   APPearance CLEAR CLEAR   Specific Gravity, Urine 1.015 1.005 - 1.030   pH 6.0 5.0 - 8.0   Glucose, UA NEGATIVE NEGATIVE mg/dL   Hgb urine dipstick NEGATIVE NEGATIVE   Bilirubin Urine NEGATIVE NEGATIVE   Ketones, ur NEGATIVE NEGATIVE mg/dL   Protein, ur NEGATIVE NEGATIVE mg/dL   Urobilinogen, UA 0.2 0.0 - 1.0 mg/dL   Nitrite NEGATIVE NEGATIVE   Leukocytes, UA NEGATIVE NEGATIVE  Urine rapid drug screen (hosp performed)  Result Value Ref Range   Opiates NONE DETECTED NONE DETECTED   Cocaine NONE DETECTED NONE DETECTED   Benzodiazepines POSITIVE (A) NONE DETECTED   Amphetamines NONE DETECTED NONE DETECTED   Tetrahydrocannabinol POSITIVE (A) NONE DETECTED   Barbiturates NONE DETECTED NONE DETECTED   Laboratory interpretation all normal except elevated lipase, alcohol present, elevated MCV c/w folic acid/vit W46 deficiency c/w alcoholism     Imaging Review No results found. I have personally reviewed and evaluated these images and lab results as part of my medical decision-making.   EKG Interpretation None      MDM   Final diagnoses:  Generalized abdominal pain  Alcohol abuse    New Prescriptions   OMEPRAZOLE (PRILOSEC) 20 MG CAPSULE    Take 1 po BID x 2 weeks then once a day   ONDANSETRON (ZOFRAN ODT) 4 MG DISINTEGRATING TABLET    Take 1 tablet (4 mg total) by mouth every 8 (eight) hours as needed for nausea or vomiting.   Plan discharge  Rolland Porter, MD, Barbette Or, MD 07/18/15 [phone]

## 2015-07-18 NOTE — ED Notes (Signed)
Pt c/o vomiting blood and c/o abdominal pain that started x 1 hour pta

## 2015-07-18 NOTE — Discharge Instructions (Signed)
NO MORE ALCOHOL!! That is the reason you have the abdominal pain!  You should have plenty of pain pills at home, you just got #120 about 10 days ago. You will need to talk to Dr Cindie Laroche if you need more pain medications. Use the zofran for nausea or vomiting. Drink liquids, non-alcoholic, for the next 1-2 days then advance to a bland diet slowly. Nothing fried, spicy or greasy for at least a week. Take the prilosec as prescribed.   Chronic Pain Discharge Instructions  Emergency care providers appreciate that many patients coming to [place] are in severe pain and we wish to address their pain in the safest, most responsible manner.  It is important to recognize however, that the proper treatment of chronic pain differs from that of the pain of injuries and acute illnesses.  Our goal is to provide quality, safe, personalized care and we thank you for giving [place] the opportunity to serve you. The use of narcotics and related agents for chronic pain syndromes may lead to additional physical and psychological problems.  Nearly as many people die from prescription narcotics each year as die from car crashes.  Additionally, this risk is increased if such prescriptions are obtained from a variety of sources.  Therefore, only your primary care physician or a pain management specialist is able to safely treat such syndromes with narcotic medications long-term.    Documentation revealing such prescriptions have been sought from multiple sources may prohibit [place] from providing a refill or different narcotic medication.  Your name may be checked first through the [place].  This database is a record of controlled substance medication prescriptions that the patient has received.  This has been established by [facility] in an effort to eliminate the dangerous, and often life threatening, practice of obtaining multiple prescriptions from different medical providers.   If you have a  chronic pain syndrome (i.e. chronic headaches, recurrent back or neck pain, dental pain, abdominal or pelvis pain without a specific diagnosis, or neuropathic pain such as fibromyalgia) or recurrent visits for the same condition without an acute diagnosis, you may be treated with non-narcotics and other non-addictive medicines.  Allergic reactions or negative side effects that may be reported by a patient to such medications will not typically lead to the use of a narcotic analgesic or other controlled substance as an alternative.   Patients managing chronic pain with a personal physician should have provisions in place for breakthrough pain.  If you are in crisis, you should call your physician.  If your physician directs you to the emergency department, please have the doctor call and speak to our attending physician concerning your care.   When patients come to the Emergency Department (ED) with acute medical conditions in which the Emergency Department physician feels appropriate to prescribe narcotic or sedating pain medication, the physician will prescribe these in very limited quantities.  The amount of these medications will last only until you can see your primary care physician in his/her office.  Any patient who returns to the ED seeking refills should expect only non-narcotic pain medications.   In the event of an acute medical condition exists and the emergency physician feels it is necessary that the patient be given a narcotic or sedating medication -  a responsible adult driver should be present in the room prior to the medication being given by the nurse.   Prescriptions for narcotic or sedating medications that have been lost, stolen or  expired will not be refilled in the Emergency Department.    Patients who have chronic pain may receive non-narcotic prescriptions until seen by their primary care physician.  It is every patients personal responsibility to maintain active prescriptions  with his or her primary care physician or specialist.

## 2015-07-24 ENCOUNTER — Encounter (HOSPITAL_COMMUNITY): Payer: Self-pay

## 2015-07-24 ENCOUNTER — Encounter (HOSPITAL_COMMUNITY): Payer: Self-pay | Admitting: Emergency Medicine

## 2015-07-24 ENCOUNTER — Inpatient Hospital Stay (HOSPITAL_COMMUNITY)
Admission: EM | Admit: 2015-07-24 | Discharge: 2015-07-28 | DRG: 439 | Disposition: A | Discharge status: Home / Self Care | Payer: Medicaid Other | Attending: Family Medicine | Admitting: Family Medicine

## 2015-07-24 ENCOUNTER — Emergency Department (HOSPITAL_COMMUNITY)
Admission: EM | Admit: 2015-07-24 | Discharge: 2015-07-24 | Disposition: A | Discharge status: Home / Self Care | Payer: Medicaid Other | Source: Home / Self Care | Attending: Emergency Medicine | Admitting: Emergency Medicine

## 2015-07-24 DIAGNOSIS — E871 Hypo-osmolality and hyponatremia: Secondary | ICD-10-CM | POA: Diagnosis present

## 2015-07-24 DIAGNOSIS — K852 Alcohol induced acute pancreatitis without necrosis or infection: Secondary | ICD-10-CM | POA: Diagnosis present

## 2015-07-24 DIAGNOSIS — R109 Unspecified abdominal pain: Secondary | ICD-10-CM | POA: Diagnosis present

## 2015-07-24 DIAGNOSIS — R1013 Epigastric pain: Secondary | ICD-10-CM

## 2015-07-24 DIAGNOSIS — Z811 Family history of alcohol abuse and dependence: Secondary | ICD-10-CM | POA: Diagnosis not present

## 2015-07-24 DIAGNOSIS — K859 Acute pancreatitis without necrosis or infection, unspecified: Secondary | ICD-10-CM | POA: Diagnosis present

## 2015-07-24 DIAGNOSIS — F17210 Nicotine dependence, cigarettes, uncomplicated: Secondary | ICD-10-CM | POA: Diagnosis present

## 2015-07-24 DIAGNOSIS — G40909 Epilepsy, unspecified, not intractable, without status epilepticus: Secondary | ICD-10-CM | POA: Diagnosis present

## 2015-07-24 DIAGNOSIS — F1010 Alcohol abuse, uncomplicated: Secondary | ICD-10-CM | POA: Diagnosis present

## 2015-07-24 DIAGNOSIS — I10 Essential (primary) hypertension: Secondary | ICD-10-CM | POA: Diagnosis present

## 2015-07-24 DIAGNOSIS — K7010 Alcoholic hepatitis without ascites: Secondary | ICD-10-CM | POA: Diagnosis present

## 2015-07-24 DIAGNOSIS — Z8249 Family history of ischemic heart disease and other diseases of the circulatory system: Secondary | ICD-10-CM | POA: Diagnosis not present

## 2015-07-24 DIAGNOSIS — J449 Chronic obstructive pulmonary disease, unspecified: Secondary | ICD-10-CM | POA: Diagnosis present

## 2015-07-24 DIAGNOSIS — K219 Gastro-esophageal reflux disease without esophagitis: Secondary | ICD-10-CM | POA: Diagnosis present

## 2015-07-24 LAB — CBC WITH DIFFERENTIAL/PLATELET
BASOS ABS: 0 10*3/uL (ref 0.0–0.1)
BASOS PCT: 0 %
EOS ABS: 0 10*3/uL (ref 0.0–0.7)
EOS PCT: 0 %
HCT: 37.2 % — ABNORMAL LOW (ref 39.0–52.0)
Hemoglobin: 12.9 g/dL — ABNORMAL LOW (ref 13.0–17.0)
Lymphocytes Relative: 11 %
Lymphs Abs: 1.3 10*3/uL (ref 0.7–4.0)
MCH: 34.7 pg — ABNORMAL HIGH (ref 26.0–34.0)
MCHC: 34.7 g/dL (ref 30.0–36.0)
MCV: 100 fL (ref 78.0–100.0)
MONO ABS: 1.4 10*3/uL — AB (ref 0.1–1.0)
Monocytes Relative: 13 %
Neutro Abs: 8.8 10*3/uL — ABNORMAL HIGH (ref 1.7–7.7)
Neutrophils Relative %: 76 %
Platelets: 236 10*3/uL (ref 150–400)
RBC: 3.72 MIL/uL — ABNORMAL LOW (ref 4.22–5.81)
RDW: 11.9 % (ref 11.5–15.5)
WBC: 11.5 10*3/uL — AB (ref 4.0–10.5)

## 2015-07-24 LAB — LIPASE, BLOOD: LIPASE: 600 U/L — AB (ref 22–51)

## 2015-07-24 LAB — COMPREHENSIVE METABOLIC PANEL
ALT: 22 U/L (ref 17–63)
AST: 42 U/L — AB (ref 15–41)
Albumin: 4.9 g/dL (ref 3.5–5.0)
Alkaline Phosphatase: 104 U/L (ref 38–126)
Anion gap: 16 — ABNORMAL HIGH (ref 5–15)
BUN: 6 mg/dL (ref 6–20)
CO2: 18 mmol/L — AB (ref 22–32)
CREATININE: 0.34 mg/dL — AB (ref 0.61–1.24)
Calcium: 8.9 mg/dL (ref 8.9–10.3)
Chloride: 91 mmol/L — ABNORMAL LOW (ref 101–111)
GFR calc Af Amer: >60 mL/min (ref >60)
GFR calc non Af Amer: >60 mL/min (ref >60)
Glucose, Bld: 116 mg/dL — ABNORMAL HIGH (ref 65–99)
Potassium: 3.9 mmol/L (ref 3.5–5.1)
SODIUM: 125 mmol/L — AB (ref 135–145)
Total Bilirubin: 0.7 mg/dL (ref 0.3–1.2)
Total Protein: 8.2 g/dL — ABNORMAL HIGH (ref 6.5–8.1)

## 2015-07-24 LAB — TROPONIN I

## 2015-07-24 LAB — POC OCCULT BLOOD, ED: Fecal Occult Bld: NEGATIVE

## 2015-07-24 MED ORDER — MORPHINE SULFATE (PF) 4 MG/ML IV SOLN
4.0000 mg | Freq: Once | INTRAVENOUS | Status: AC
  Administered 2015-07-24: 4 mg via INTRAVENOUS
  Filled 2015-07-24: qty 1

## 2015-07-24 MED ORDER — MOMETASONE FURO-FORMOTEROL FUM 100-5 MCG/ACT IN AERO
2.0000 | INHALATION_SPRAY | Freq: Two times a day (BID) | RESPIRATORY_TRACT | Status: DC
  Administered 2015-07-25 – 2015-07-28 (×7): 2 via RESPIRATORY_TRACT
  Filled 2015-07-24: qty 8.8

## 2015-07-24 MED ORDER — ADULT MULTIVITAMIN W/MINERALS CH
1.0000 | ORAL_TABLET | Freq: Every day | ORAL | Status: DC
  Administered 2015-07-25 – 2015-07-28 (×4): 1 via ORAL
  Filled 2015-07-24 (×4): qty 1

## 2015-07-24 MED ORDER — THIAMINE HCL 100 MG/ML IJ SOLN: 100.0000 mg | Freq: Every day | INTRAMUSCULAR | Status: DC

## 2015-07-24 MED ORDER — SODIUM CHLORIDE 0.9 % IV BOLUS (SEPSIS)
1000.0000 mL | Freq: Once | INTRAVENOUS | Status: AC
  Administered 2015-07-24: 1000 mL via INTRAVENOUS

## 2015-07-24 MED ORDER — HYDRALAZINE HCL 25 MG PO TABS
25.0000 mg | ORAL_TABLET | Freq: Four times a day (QID) | ORAL | Status: DC | PRN
  Administered 2015-07-24 – 2015-07-27 (×4): 25 mg via ORAL
  Filled 2015-07-24 (×4): qty 1

## 2015-07-24 MED ORDER — FOLIC ACID 1 MG PO TABS
1.0000 mg | ORAL_TABLET | Freq: Every day | ORAL | Status: DC
  Administered 2015-07-25 – 2015-07-28 (×4): 1 mg via ORAL
  Filled 2015-07-24 (×4): qty 1

## 2015-07-24 MED ORDER — LEVETIRACETAM 500 MG PO TABS
500.0000 mg | ORAL_TABLET | Freq: Two times a day (BID) | ORAL | Status: DC
  Administered 2015-07-24 – 2015-07-28 (×8): 500 mg via ORAL
  Filled 2015-07-24 (×8): qty 1

## 2015-07-24 MED ORDER — ACETAMINOPHEN 325 MG PO TABS: 650.0000 mg | ORAL_TABLET | Freq: Four times a day (QID) | ORAL | Status: DC | PRN

## 2015-07-24 MED ORDER — LORAZEPAM 2 MG/ML IJ SOLN: 1.0000 mg | Freq: Four times a day (QID) | INTRAMUSCULAR | Status: DC | PRN

## 2015-07-24 MED ORDER — ALBUTEROL SULFATE (2.5 MG/3ML) 0.083% IN NEBU: 3.0000 mL | INHALATION_SOLUTION | Freq: Four times a day (QID) | RESPIRATORY_TRACT | Status: DC | PRN

## 2015-07-24 MED ORDER — ACETAMINOPHEN 650 MG RE SUPP: 650.0000 mg | Freq: Four times a day (QID) | RECTAL | Status: DC | PRN

## 2015-07-24 MED ORDER — HYDROMORPHONE HCL 1 MG/ML IJ SOLN
1.0000 mg | Freq: Once | INTRAMUSCULAR | Status: AC
  Administered 2015-07-24: 1 mg via INTRAVENOUS
  Filled 2015-07-24: qty 1

## 2015-07-24 MED ORDER — LORAZEPAM 1 MG PO TABS
1.0000 mg | ORAL_TABLET | Freq: Four times a day (QID) | ORAL | Status: DC | PRN
  Administered 2015-07-24: 1 mg via ORAL
  Filled 2015-07-24: qty 1

## 2015-07-24 MED ORDER — HYDROMORPHONE HCL 1 MG/ML IJ SOLN
1.0000 mg | INTRAMUSCULAR | Status: DC | PRN
  Administered 2015-07-24: 1 mg via INTRAVENOUS
  Filled 2015-07-24: qty 1

## 2015-07-24 MED ORDER — SODIUM CHLORIDE 0.9 % IV SOLN
INTRAVENOUS | Status: DC
  Administered 2015-07-25 – 2015-07-28 (×8): via INTRAVENOUS

## 2015-07-24 MED ORDER — VITAMIN B-1 100 MG PO TABS
100.0000 mg | ORAL_TABLET | Freq: Every day | ORAL | Status: DC
  Administered 2015-07-25 – 2015-07-28 (×4): 100 mg via ORAL
  Filled 2015-07-24 (×4): qty 1

## 2015-07-24 MED ORDER — CARBAMAZEPINE 200 MG PO TABS
400.0000 mg | ORAL_TABLET | Freq: Two times a day (BID) | ORAL | Status: DC
  Administered 2015-07-24 – 2015-07-28 (×8): 400 mg via ORAL
  Filled 2015-07-24 (×8): qty 2

## 2015-07-24 MED ORDER — PANTOPRAZOLE SODIUM 40 MG IV SOLR
40.0000 mg | INTRAVENOUS | Status: DC
  Administered 2015-07-24 – 2015-07-27 (×3): 40 mg via INTRAVENOUS
  Filled 2015-07-24 (×3): qty 40

## 2015-07-24 MED ORDER — ONDANSETRON HCL 4 MG/2ML IJ SOLN
4.0000 mg | Freq: Once | INTRAMUSCULAR | Status: AC
  Administered 2015-07-24: 4 mg via INTRAVENOUS
  Filled 2015-07-24: qty 2

## 2015-07-24 MED ORDER — ENOXAPARIN SODIUM 40 MG/0.4ML ~~LOC~~ SOLN
40.0000 mg | SUBCUTANEOUS | Status: DC
  Administered 2015-07-25 – 2015-07-28 (×4): 40 mg via SUBCUTANEOUS
  Filled 2015-07-24 (×4): qty 0.4

## 2015-07-24 NOTE — H&P (Signed)
PCP:   Maricela Curet, MD   Chief Complaint:  Abdominal pain  HPI:  39 year old male who  has a past medical history of Pancreatitis; Seizures; Back pain; ETOH abuse; COPD (chronic obstructive pulmonary disease); SAH (subarachnoid hemorrhage); Macrocytosis (08/17/2011); Bradycardia (08/17/2011); Pneumonia; and GERD (gastroesophageal reflux disease). Today comes to the hospital with complaints of abdominal pain. Patient has history of chronic alcohol abuse and drinks 12 packs beer everyday. As per patient he started having chest pain which moved down to the upper epigastric region along with abdomen region. He denies nausea and vomiting. No diarrhea. In the ED patient was found to have elevated lipase 600.  Allergies:  No Known Allergies    Past Medical History  Diagnosis Date  . Pancreatitis   . Seizures   . Back pain   . ETOH abuse   . COPD (chronic obstructive pulmonary disease)   . SAH (subarachnoid hemorrhage)   . Macrocytosis 08/17/2011  . Bradycardia 08/17/2011  . Pneumonia   . GERD (gastroesophageal reflux disease)     Past Surgical History  Procedure Laterality Date  . Back surgery    . Septoplasty      Prior to Admission medications   Medication Sig Start Date End Date Taking? Authorizing [place]  albuterol (PROVENTIL HFA;VENTOLIN HFA) 108 (90 BASE) MCG/ACT inhaler Inhale 2 puffs into the lungs every 6 (six) hours as needed for wheezing or shortness of breath.   Yes Historical Usman Millett, MD  albuterol (PROVENTIL) (2.5 MG/3ML) 0.083% nebulizer solution Take 3 mLs (2.5 mg total) by nebulization every 4 (four) hours as needed for wheezing or shortness of breath. 09/09/13  Yes Orpah Greek, MD  carbamazepine (TEGRETOL) 200 MG tablet Take 400 mg by mouth 2 (two) times daily.   Yes Historical Ryka Beighley, MD  Fluticasone-Salmeterol (ADVAIR) 250-50 MCG/DOSE AEPB Inhale 1 puff into the lungs 2 (two) times daily.   Yes Historical Rolinda Impson, MD  levETIRAcetam  (KEPPRA) 500 MG tablet Take 500 mg by mouth 2 (two) times daily.    Yes Historical Kekai Geter, MD  ondansetron (ZOFRAN ODT) 4 MG disintegrating tablet Take 1 tablet (4 mg total) by mouth every 8 (eight) hours as needed for nausea or vomiting. 07/18/15  Yes Rolland Porter, MD  oxyCODONE-acetaminophen (PERCOCET) 10-325 MG per tablet Take 1 tablet by mouth every 6 (six) hours as needed for pain.    Yes Historical Evrett Hakim, MD  promethazine (PHENERGAN) 25 MG tablet Take 1 tablet (25 mg total) by mouth every 6 (six) hours as needed for nausea or vomiting. 03/09/15  Yes Milton Ferguson, MD  omeprazole (PRILOSEC) 20 MG capsule Take 1 po BID x 2 weeks then once a day 07/18/15   Rolland Porter, MD  pantoprazole (PROTONIX) 20 MG tablet Take 1 tablet (20 mg total) by mouth daily. 03/03/14   Carmin Muskrat, MD  predniSONE (DELTASONE) 20 MG tablet Take 3 tablets (60 mg total) by mouth daily. Patient not taking: Reported on 03/09/2015 01/13/15   Davonna Belling, MD  promethazine (PHENERGAN) 25 MG tablet Take 1 tablet (25 mg total) by mouth every 6 (six) hours as needed. 04/17/15   Fredia Sorrow, MD  ranitidine (ZANTAC) 150 MG capsule Take 1 capsule (150 mg total) by mouth 2 (two) times daily. 03/09/15   Milton Ferguson, MD    Social History:  reports that he has been smoking Cigarettes.  He has a 20 pack-year smoking history. He has never used smokeless tobacco. He reports that he drinks about 25.2 oz of alcohol per  week. He reports that he uses illicit drugs (Marijuana).  Family History  Problem Relation Age of Onset  . Seizures Father   . Alcohol abuse Father   . Coronary artery disease Mother     deceased age 67  . Colon cancer Neg Hx     Filed Weights   07/24/15 1701  Weight: 56.7 kg (125 lb)    All the positives are listed in BOLD  Review of Systems:  HEENT: Headache, blurred vision, runny nose, sore throat Neck: Hypothyroidism, hyperthyroidism,,lymphadenopathy Chest : Shortness of breath, history of COPD,  Asthma Heart : Chest pain, history of coronary arterey disease GI:  Nausea, vomiting, diarrhea, constipation, GERD GU: Dysuria, urgency, frequency of urination, hematuria Neuro: Stroke, seizures, syncope Psych: Depression, anxiety, hallucinations   Physical Exam: Blood pressure 183/86, pulse 87, temperature 98 F (36.7 C), temperature source Oral, resp. rate 14, height 5\' 8"  (1.727 m), weight 56.7 kg (125 lb), SpO2 95 %. Constitutional:   Patient is a well-developed and well-nourished male in no acute distress and cooperative with exam. Head: Normocephalic and atraumatic Mouth: Mucus membranes moist Eyes: PERRL, EOMI, conjunctivae normal Neck: Supple, No Thyromegaly Cardiovascular: RRR, S1 normal, S2 normal Pulmonary/Chest: CTAB, no wheezes, rales, or rhonchi Abdominal: Soft. Tenderness to palpation in the epigastric region, positive guarding non-distended, bowel sounds are normal, no masses, organomegaly Neurological: A&O x3, Strength is normal and symmetric bilaterally, cranial nerve II-XII are grossly intact, no focal motor deficit, sensory intact to light touch bilaterally.  Extremities : No Cyanosis, Clubbing or Edema  Labs on Admission:  Basic Metabolic Panel:  Recent Labs Lab 07/18/15 0419 07/24/15 1948  NA 135 125*  K 3.5 3.9  CL 100* 91*  CO2 24 18*  GLUCOSE 97 116*  BUN 7 6  CREATININE <0.30* 0.34*  CALCIUM 8.3* 8.9   Liver Function Tests:  Recent Labs Lab 07/18/15 0419 07/24/15 1948  AST 46* 42*  ALT 18 22  ALKPHOS 105 104  BILITOT 0.2* 0.7  PROT 7.6 8.2*  ALBUMIN 4.2 4.9    Recent Labs Lab 07/18/15 0419 07/24/15 1948  LIPASE 633* 600*   No results for input(s): AMMONIA in the last 168 hours. CBC:  Recent Labs Lab 07/18/15 0419 07/24/15 1948  WBC 9.9 11.5*  NEUTROABS 6.9 8.8*  HGB 13.4 12.9*  HCT 38.6* 37.2*  MCV 103.2* 100.0  PLT 237 236   Cardiac Enzymes:  Recent Labs Lab 07/24/15 1845  TROPONINI <0.03     EKG:  Independently reviewed. Sinus rhythm   Assessment/Plan Active Problems:   Alcohol abuse, continuous   Seizure disorder   Abdominal pain   HTN (hypertension)   Pancreatitis   Hyponatremia   Pancreatitis, alcoholic, acute  Acute pancreatitis Patient has alcohol induced acute hepatitis, will keep the patient nothing by mouth, IV fluids normal saline at 150 mL per hour. Dilaudid 1 mg every 4 hours when necessary for pain. Will repeat lipase in a.m.  Hyponatremia Patient drinks beer everyday likely beer potomania. Will check serum and urine osmolality. Follow BMP in a.m.  Alcohol abuse Patient drinks beer everyday, will start CIWA protocol Thiamine and folate Patient counseled for alcohol abuse  Hypertension Patient's poor pressure is elevated, will start hydralazine 25 mg by mouth every 6 hours when necessary for BP greater than 160/100. If patient's blood pressure persistently elevated, consider starting antihypertensives medications   Seizure disorder Continue Keppra, carbamazepine  DVT prophylaxis Lovenox   Code status: full code  Family discussion: no family at bedside  Time Spent on Admission: 60 min  [place] [facility] Pager: [phone] 07/24/2015, 10:05 PM  If 7PM-7AM, please contact night-coverage  [email or web address]  Password [email or web address]

## 2015-07-24 NOTE — ED Provider Notes (Signed)
CSN: [id number]     Arrival date & time 07/24/15  1647 History   First MD Initiated Contact with Patient 07/24/15 1817     Chief Complaint  Patient presents with  . Abdominal Pain  . Chest Pain     (Consider location/radiation/quality/duration/timing/severity/associated sxs/prior Treatment) HPI..... Epigastric pain past 24 hours. Patient seen earlier today in the ED and sent home. Now he c/o persistent pain. Past medical history includes pancreatitis and alcohol consumption. No radiation of pain. Severity is moderate to severe. Unable to keep fluids down.  Past Medical History  Diagnosis Date  . Pancreatitis   . Seizures   . Back pain   . ETOH abuse   . COPD (chronic obstructive pulmonary disease)   . SAH (subarachnoid hemorrhage)   . Macrocytosis 08/17/2011  . Bradycardia 08/17/2011  . Pneumonia   . GERD (gastroesophageal reflux disease)    Past Surgical History  Procedure Laterality Date  . Back surgery    . Septoplasty     Family History  Problem Relation Age of Onset  . Seizures Father   . Alcohol abuse Father   . Coronary artery disease Mother     deceased age 74  . Colon cancer Neg Hx    Social History  Substance Use Topics  . Smoking status: Current Every Day Smoker -- 1.00 packs/day for 20 years    Types: Cigarettes  . Smokeless tobacco: Never Used  . Alcohol Use: 25.2 oz/week    42 Cans of beer per week     Comment: daily, at least 18 beers a day     Review of Systems  All other systems reviewed and are negative.     Allergies  Review of patient's allergies indicates no known allergies.  Home Medications   Prior to Admission medications   Medication Sig Start Date End Date Taking? Authorizing Provider  albuterol (PROVENTIL HFA;VENTOLIN HFA) 108 (90 BASE) MCG/ACT inhaler Inhale 2 puffs into the lungs every 6 (six) hours as needed for wheezing or shortness of breath.   Yes Historical Provider, MD  albuterol (PROVENTIL) (2.5 MG/3ML) 0.083%  nebulizer solution Take 3 mLs (2.5 mg total) by nebulization every 4 (four) hours as needed for wheezing or shortness of breath. 09/09/13  Yes Orpah Greek, MD  carbamazepine (TEGRETOL) 200 MG tablet Take 400 mg by mouth 2 (two) times daily.   Yes Historical Provider, MD  Fluticasone-Salmeterol (ADVAIR) 250-50 MCG/DOSE AEPB Inhale 1 puff into the lungs 2 (two) times daily.   Yes Historical Provider, MD  levETIRAcetam (KEPPRA) 500 MG tablet Take 500 mg by mouth 2 (two) times daily.    Yes Historical Provider, MD  ondansetron (ZOFRAN ODT) 4 MG disintegrating tablet Take 1 tablet (4 mg total) by mouth every 8 (eight) hours as needed for nausea or vomiting. 07/18/15  Yes Rolland Porter, MD  oxyCODONE-acetaminophen (PERCOCET) 10-325 MG per tablet Take 1 tablet by mouth every 6 (six) hours as needed for pain.    Yes Historical Provider, MD  promethazine (PHENERGAN) 25 MG tablet Take 1 tablet (25 mg total) by mouth every 6 (six) hours as needed for nausea or vomiting. 03/09/15  Yes Milton Ferguson, MD  omeprazole (PRILOSEC) 20 MG capsule Take 1 po BID x 2 weeks then once a day 07/18/15   Rolland Porter, MD  pantoprazole (PROTONIX) 20 MG tablet Take 1 tablet (20 mg total) by mouth daily. 03/03/14   Carmin Muskrat, MD  predniSONE (DELTASONE) 20 MG tablet Take 3 tablets (60 mg  total) by mouth daily. Patient not taking: Reported on 03/09/2015 01/13/15   Davonna Belling, MD  promethazine (PHENERGAN) 25 MG tablet Take 1 tablet (25 mg total) by mouth every 6 (six) hours as needed. 04/17/15   Fredia Sorrow, MD  ranitidine (ZANTAC) 150 MG capsule Take 1 capsule (150 mg total) by mouth 2 (two) times daily. 03/09/15   Milton Ferguson, MD   BP 183/86 mmHg  Pulse 87  Temp(Src) 98 F (36.7 C) (Oral)  Resp 14  Ht 5\' 8"  (1.727 m)  Wt 125 lb (56.7 kg)  BMI 19.01 kg/m2  SpO2 95% Physical Exam  Constitutional: He is oriented to person, place, and time.  In pain  HENT:  Head: Normocephalic and atraumatic.  Eyes: Conjunctivae  and EOM are normal. Pupils are equal, round, and reactive to light.  Neck: Normal range of motion. Neck supple.  Cardiovascular: Normal rate and regular rhythm.   Pulmonary/Chest: Effort normal and breath sounds normal.  Abdominal: Soft. Bowel sounds are normal.  Tender epigastrium  Musculoskeletal: Normal range of motion.  Neurological: He is alert and oriented to person, place, and time.  Skin: Skin is warm and dry.  Psychiatric: He has a normal mood and affect. His behavior is normal.  Nursing note and vitals reviewed.   ED Course  Procedures (including critical care time) Labs Review Labs Reviewed  CBC WITH DIFFERENTIAL/PLATELET - Abnormal; Notable for the following:    WBC 11.5 (*)    RBC 3.72 (*)    Hemoglobin 12.9 (*)    HCT 37.2 (*)    MCH 34.7 (*)    Neutro Abs 8.8 (*)    Monocytes Absolute 1.4 (*)    All other components within normal limits  COMPREHENSIVE METABOLIC PANEL - Abnormal; Notable for the following:    Sodium 125 (*)    Chloride 91 (*)    CO2 18 (*)    Glucose, Bld 116 (*)    Creatinine, Ser 0.34 (*)    Total Protein 8.2 (*)    AST 42 (*)    Anion gap 16 (*)    All other components within normal limits  LIPASE, BLOOD - Abnormal; Notable for the following:    Lipase 600 (*)    All other components within normal limits  TROPONIN I    Imaging Review No results found. I have personally reviewed and evaluated these images and lab results as part of my medical decision-making.   EKG Interpretation   Date/Time:  Sunday July 24 2015 18:17:27 EDT Ventricular Rate:  79 PR Interval:  203 QRS Duration: 101 QT Interval:  428 QTC Calculation: 491 R Axis:   87 Text Interpretation:  Sinus rhythm Biatrial enlargement Nonspecific T  abnormalities, lateral leads Borderline prolonged QT interval Confirmed by  Lacinda Axon  MD, BRIAN ([id number]) on 07/24/2015 6:37:49 PM      MDM   Final diagnoses:  Alcohol-induced acute pancreatitis    Lipase 600. Vital  signs are stable. Will admit patient for exacerbation of pancreatitis. IV fluids and pain management.    Nat Christen, MD 07/24/15 2157

## 2015-07-24 NOTE — ED Notes (Signed)
Made Dr. Lacinda Axon aware Pt requesting more pain medication.

## 2015-07-24 NOTE — ED Provider Notes (Signed)
CSN: [id number]     Arrival date & time 07/24/15  1219 History  This chart was scribed for Ripley Fraise, MD by Hilda Lias, ED Scribe. This patient was seen in room APA15/[facility]15 and the patient's care was started at 12:29 PM.    Chief Complaint  Patient presents with  . Abdominal Pain      The history is provided by the patient. No language interpreter was used.   HPI Comments: [name] is a 39 y.o. male who presents to the Emergency Department complaining of constant upper abdominal pain that has been present for an hour. Pt states he had a beer this morning and then pain began. Pt denies dysuria, vomiting, diarrhea, chest pain, cough. Pt denies doing anything to treat his pain. He does report recent blood in stool but no dark/bloody stools today   Past Medical History  Diagnosis Date  . Pancreatitis   . Seizures   . Back pain   . ETOH abuse   . COPD (chronic obstructive pulmonary disease)   . SAH (subarachnoid hemorrhage)   . Macrocytosis 08/17/2011  . Bradycardia 08/17/2011  . Pneumonia   . GERD (gastroesophageal reflux disease)    Past Surgical History  Procedure Laterality Date  . Back surgery    . Septoplasty     Family History  Problem Relation Age of Onset  . Seizures Father   . Alcohol abuse Father   . Coronary artery disease Mother     deceased age 35  . Colon cancer Neg Hx    Social History  Substance Use Topics  . Smoking status: Current Every Day Smoker -- 1.00 packs/day for 20 years    Types: Cigarettes  . Smokeless tobacco: Never Used  . Alcohol Use: 25.2 oz/week    42 Cans of beer per week     Comment: daily, at least 18 beers a day     Review of Systems  Respiratory: Negative for cough.   Cardiovascular: Negative for chest pain.  Gastrointestinal: Positive for abdominal pain. Negative for vomiting and diarrhea.  Genitourinary: Negative for dysuria.  All other systems reviewed and are negative.     Allergies  Review of  patient's allergies indicates no known allergies.  Home Medications   Prior to Admission medications   Medication Sig Start Date End Date Taking? Authorizing Provider  albuterol (PROVENTIL HFA;VENTOLIN HFA) 108 (90 BASE) MCG/ACT inhaler Inhale 2 puffs into the lungs every 6 (six) hours as needed for wheezing or shortness of breath.    Historical Provider, MD  albuterol (PROVENTIL) (2.5 MG/3ML) 0.083% nebulizer solution Take 3 mLs (2.5 mg total) by nebulization every 4 (four) hours as needed for wheezing or shortness of breath. 09/09/13   Orpah Greek, MD  carbamazepine (TEGRETOL) 200 MG tablet Take 400 mg by mouth 2 (two) times daily.    Historical Provider, MD  Fluticasone-Salmeterol (ADVAIR) 250-50 MCG/DOSE AEPB Inhale 1 puff into the lungs 2 (two) times daily.    Historical Provider, MD  HYDROmorphone (DILAUDID) 4 MG tablet Take 1 tablet (4 mg total) by mouth every 4 (four) hours as needed for severe pain. 03/09/15   Milton Ferguson, MD  ibuprofen (ADVIL,MOTRIN) 800 MG tablet Take 1 tablet (800 mg total) by mouth 3 (three) times daily. Patient not taking: Reported on 12/26/2014 07/03/14   Britt Bottom, NP  levETIRAcetam (KEPPRA) 500 MG tablet Take 500 mg by mouth 2 (two) times daily.     Historical Provider, MD  omeprazole (PRILOSEC) 20  MG capsule Take 1 po BID x 2 weeks then once a day 07/18/15   Rolland Porter, MD  ondansetron (ZOFRAN ODT) 4 MG disintegrating tablet Take 1 tablet (4 mg total) by mouth every 8 (eight) hours as needed for nausea or vomiting. 07/18/15   Rolland Porter, MD  oxyCODONE-acetaminophen (PERCOCET) 10-325 MG per tablet Take 1 tablet by mouth 4 (four) times daily.     Historical Provider, MD  pantoprazole (PROTONIX) 20 MG tablet Take 1 tablet (20 mg total) by mouth daily. 03/03/14   Carmin Muskrat, MD  predniSONE (DELTASONE) 20 MG tablet Take 3 tablets (60 mg total) by mouth daily. Patient not taking: Reported on 03/09/2015 01/13/15   Davonna Belling, MD  promethazine  (PHENERGAN) 25 MG tablet Take 1 tablet (25 mg total) by mouth every 6 (six) hours as needed for nausea or vomiting. 03/09/15   Milton Ferguson, MD  promethazine (PHENERGAN) 25 MG tablet Take 1 tablet (25 mg total) by mouth every 6 (six) hours as needed. 04/17/15   Fredia Sorrow, MD  ranitidine (ZANTAC) 150 MG capsule Take 1 capsule (150 mg total) by mouth 2 (two) times daily. 03/09/15   Milton Ferguson, MD   BP 168/87 mmHg  Pulse 72  Temp(Src) 98.6 F (37 C) (Oral)  Resp 17  Ht 5\' 9"  (1.753 m)  Wt 125 lb (56.7 kg)  BMI 18.45 kg/m2  SpO2 97% Physical Exam  Nursing note and vitals reviewed.  CONSTITUTIONAL: Well developed/well nourished HEAD: Normocephalic/atraumatic EYES: EOMI/PERRL ENMT: Mucous membranes moist NECK: supple no meningeal signs SPINE/BACK:entire spine nontender CV: S1/S2 noted, no murmurs/rubs/gallops noted LUNGS: Lungs are clear to auscultation bilaterally, no apparent distress ABDOMEN: soft, mild epigastric tenderness, no rebound or guarding, bowel sounds noted throughout abdomen GU:no cva tenderness Rectal - no stool in vault, no blood or melena noted, chaperone present for exam NEURO: Pt is awake/alert/appropriate, moves all extremitiesx4.  No facial droop.   EXTREMITIES: pulses normal/equal, full ROM SKIN: warm, color normal PSYCH: no abnormalities of mood noted, alert and oriented to situation  ED Course  Procedures  DIAGNOSTIC STUDIES: Oxygen Saturation is 97% on room air, normal by my interpretation.    COORDINATION OF CARE: 12:31 PM Discussed treatment plan with pt at bedside and pt agreed to plan.  Pt can ambulate He is taking PO fluids He has mild epigastric tenderness but no other focal findings He reports this feels similar to prior episodes of pancreatitis after drinking ETOH He is stable and nontoxic appearing Will d/c home  Labs Review Labs Reviewed  POC OCCULT BLOOD, ED    I have personally reviewed and evaluated these lab results as part  of my medical decision-making.    MDM   Final diagnoses:  Epigastric abdominal pain    Nursing notes including past medical history and social history reviewed and considered in documentation Labs/vital reviewed myself and considered during evaluation   I personally performed the services described in this documentation, which was scribed in my presence. The recorded information has been reviewed and is accurate.       Ripley Fraise, MD 07/24/15 [phone]

## 2015-07-24 NOTE — ED Notes (Signed)
Pt brought in by EMS for complaints of upper abdominal pain.per EMS pt states he had a beer this morning and then pain started approx one hour ago Has hx of panreatitits and should stop drinking.

## 2015-07-24 NOTE — ED Notes (Signed)
Pt in triage waiting area.  No distress noted.

## 2015-07-24 NOTE — ED Notes (Signed)
Pt states that he was here earlier for abdominal pain and he did not receive any medications.  States that the pain is so bad that it is moving up into his chest and he has got to have something to treat his pain.  Uncooperative at triage.

## 2015-07-24 NOTE — ED Notes (Addendum)
Pt called out to desk multiple times for more pain medication.  Made Dr. Lacinda Axon Aware.

## 2015-07-24 NOTE — Discharge Instructions (Signed)

## 2015-07-25 ENCOUNTER — Encounter (HOSPITAL_COMMUNITY): Payer: Self-pay | Admitting: *Deleted

## 2015-07-25 LAB — TROPONIN I
Troponin I: <0.03 ng/mL (ref <0.031)
Troponin I: <0.03 ng/mL (ref <0.031)
Troponin I: <0.03 ng/mL (ref <0.031)

## 2015-07-25 LAB — MAGNESIUM: MAGNESIUM: 1.9 mg/dL (ref 1.7–2.4)

## 2015-07-25 LAB — LIPASE, BLOOD: LIPASE: 269 U/L — AB (ref 22–51)

## 2015-07-25 LAB — OSMOLALITY: OSMOLALITY: 266 mosm/kg — AB (ref 275–300)

## 2015-07-25 MED ORDER — HYDROMORPHONE HCL 1 MG/ML IJ SOLN
1.0000 mg | INTRAMUSCULAR | Status: DC | PRN
  Administered 2015-07-25 (×3): 1 mg via INTRAVENOUS
  Filled 2015-07-25 (×4): qty 1

## 2015-07-25 MED ORDER — HYDROMORPHONE HCL 1 MG/ML IJ SOLN
2.0000 mg | INTRAMUSCULAR | Status: DC | PRN
  Administered 2015-07-25 – 2015-07-27 (×12): 2 mg via INTRAVENOUS
  Filled 2015-07-25 (×12): qty 2

## 2015-07-25 MED ORDER — HYDROMORPHONE HCL 1 MG/ML IJ SOLN
1.0000 mg | Freq: Once | INTRAMUSCULAR | Status: AC
  Administered 2015-07-25: 1 mg via INTRAVENOUS

## 2015-07-25 MED ORDER — LORAZEPAM 1 MG PO TABS
1.0000 mg | ORAL_TABLET | Freq: Three times a day (TID) | ORAL | Status: DC
  Administered 2015-07-25 – 2015-07-28 (×12): 1 mg via ORAL
  Filled 2015-07-25 (×12): qty 1

## 2015-07-25 NOTE — Progress Notes (Signed)
Patient admitted with ethanol-induced pancreatitis hepatitis drank a 12 pack a day. I'll E no additional liquor lipase down from 600-69 on Dilaudid 2 mg every 3 hours he is currently nothing by mouth [name] [id number] DOB: 09/12/1976 DOA: 07/24/2015 PCP: Maricela Curet, MD             Physical Exam: Blood pressure 220/110, pulse 72, temperature 98.2 F (36.8 C), temperature source Oral, resp. rate 20, height 5' 9"  (1.753 m), weight 126 lb (57.153 kg), SpO2 94 %. lungs show prolonged history phase scattered rhonchi no rales mild H Terry wheeze noted heart heart regular rhythm no S3-S4 no heaves thrills or rubs. Abdomen mild epigastric tenderness bowel sounds normoactive no peristaltic rushes no guarding or rebound no masses no megaly   Investigations:  No results found for this or any previous visit (from the past 240 hour(s)).   Basic Metabolic Panel:  Recent Labs  07/24/15 1948  NA 125*  K 3.9  CL 91*  CO2 18*  GLUCOSE 116*  BUN 6  CREATININE 0.34*  CALCIUM 8.9   Liver Function Tests:  Recent Labs  07/24/15 1948  AST 42*  ALT 22  ALKPHOS 104  BILITOT 0.7  PROT 8.2*  ALBUMIN 4.9     CBC:  Recent Labs  07/24/15 1948  WBC 11.5*  NEUTROABS 8.8*  HGB 12.9*  HCT 37.2*  MCV 100.0  PLT 236    No results found.    Medications:   Impression:  Active Problems:   Alcohol abuse, continuous   Seizure disorder   Abdominal pain   HTN (hypertension)   Pancreatitis   Hyponatremia   Pancreatitis, alcoholic, acute     Plan: CBC and magnesium and be met daily 2-lead Ativan 1 mg before meals and at bedtime for withdrawal of ethanol and sedation continue Dilaudid 2 mg every 3 hours monitor lipase in a.m.  Consultants:    Procedures   Antibiotics:                   Code Status:  Family Communication:    Disposition Plan see plan above  Time spent: 30 minutes   LOS: 1 day   [name],[name]   07/25/2015, 1:41  PM

## 2015-07-25 NOTE — Progress Notes (Signed)
Late Entry for 07/25/2015  Dr. Cindie Laroche was notified this morning because the patient was saying that his current medication regimen was not relieving his pain.  The patient requested the time frequency be changed.  New orders given and followed.

## 2015-07-25 NOTE — Care Management Note (Signed)
Case Management Note  Patient Details  Name: [name] MRN: [id number] Date of Birth: 04/15/1976  Expected Discharge Date:  07/27/15               Expected Discharge Plan:  Home/Self Care  In-House Referral:  NA  Discharge planning Services  CM Consult  Post Acute Care Choice:  NA Choice offered to:  NA  DME Arranged:    DME Agency:     HH Arranged:    [place] Agency:     Status of Service:  Completed, signed off  Medicare Important Message Given:    Date Medicare IM Given:    Medicare IM give by:    Date Additional Medicare IM Given:    Additional Medicare Important Message give by:     If discussed at [place] of Stay Meetings, dates discussed:    Additional Comments: Pt is from home, lives with his gf and is ind at baseline. Pt plans to return home with self care at DC. Pt discussed his ETOH abuse and says he used to drink for fun and now he has to drink. He says he is ready to stop drinking. I asked if he would like info on resources to help his stop and he said, no because he has all of that information at home and knows how to get help. No CM needs noted.  Sherald Barge, RN 07/25/2015, 2:11 PM

## 2015-07-25 NOTE — Progress Notes (Signed)
Patient complained that pain medication is not controlling his pain. Paged the on-call MD, will follow any new orders received and continue to monitor the patient.

## 2015-07-25 NOTE — Progress Notes (Signed)
Pt removed i.v., removed telemetry, and turned bed alarm off. Central tele notified nurse. Nurse checked on pt who was standing at the sink. Pt educated. Pt sitting on the side of the bed and refuses bed alarm. Will continue to monitor pt.

## 2015-07-26 LAB — BASIC METABOLIC PANEL
Anion gap: 8 (ref 5–15)
CHLORIDE: 97 mmol/L — AB (ref 101–111)
CO2: 25 mmol/L (ref 22–32)
CREATININE: 0.3 mg/dL — AB (ref 0.61–1.24)
Calcium: 8.3 mg/dL — ABNORMAL LOW (ref 8.9–10.3)
GFR calc Af Amer: >60 mL/min (ref >60)
GFR calc non Af Amer: >60 mL/min (ref >60)
GLUCOSE: 125 mg/dL — AB (ref 65–99)
POTASSIUM: 3.5 mmol/L (ref 3.5–5.1)
Sodium: 130 mmol/L — ABNORMAL LOW (ref 135–145)

## 2015-07-26 LAB — LIPASE, BLOOD: Lipase: 112 U/L — ABNORMAL HIGH (ref 22–51)

## 2015-07-26 LAB — CBC WITH DIFFERENTIAL/PLATELET
Basophils Absolute: 0 10*3/uL (ref 0.0–0.1)
Basophils Relative: 0 %
EOS ABS: 0.1 10*3/uL (ref 0.0–0.7)
EOS PCT: 1 %
HCT: 37.2 % — ABNORMAL LOW (ref 39.0–52.0)
HEMOGLOBIN: 12.7 g/dL — AB (ref 13.0–17.0)
LYMPHS ABS: 1.8 10*3/uL (ref 0.7–4.0)
LYMPHS PCT: 24 %
MCH: 35 pg — AB (ref 26.0–34.0)
MCHC: 34.1 g/dL (ref 30.0–36.0)
MCV: 102.5 fL — AB (ref 78.0–100.0)
MONOS PCT: 15 %
Monocytes Absolute: 1.1 10*3/uL — ABNORMAL HIGH (ref 0.1–1.0)
Neutro Abs: 4.6 10*3/uL (ref 1.7–7.7)
Neutrophils Relative %: 60 %
PLATELETS: 224 10*3/uL (ref 150–400)
RBC: 3.63 MIL/uL — AB (ref 4.22–5.81)
RDW: 12 % (ref 11.5–15.5)
WBC: 7.5 10*3/uL (ref 4.0–10.5)

## 2015-07-26 LAB — OSMOLALITY, URINE: Osmolality, Ur: 99 mOsm/kg — ABNORMAL LOW (ref 390–1090)

## 2015-07-26 MED ORDER — LORAZEPAM 0.5 MG PO TABS: 0.5000 mg | ORAL_TABLET | Freq: Four times a day (QID) | ORAL | Status: AC | PRN

## 2015-07-26 MED ORDER — LORAZEPAM 2 MG/ML IJ SOLN: 1.0000 mg | Freq: Four times a day (QID) | INTRAMUSCULAR | Status: AC | PRN

## 2015-07-26 MED ORDER — NICOTINE 21 MG/24HR TD PT24
21.0000 mg | MEDICATED_PATCH | Freq: Every day | TRANSDERMAL | Status: DC
  Administered 2015-07-26 – 2015-07-28 (×3): 21 mg via TRANSDERMAL
  Filled 2015-07-26 (×3): qty 1

## 2015-07-26 MED ORDER — MORPHINE SULFATE (PF) 4 MG/ML IV SOLN
4.0000 mg | INTRAVENOUS | Status: DC | PRN
  Administered 2015-07-26: 4 mg via INTRAMUSCULAR
  Filled 2015-07-26: qty 1

## 2015-07-26 NOTE — Progress Notes (Signed)
Per Dr. Denita Lung progress note from 07/26/15, placed patient on full liquid diet.

## 2015-07-26 NOTE — Progress Notes (Signed)
Hemoglobin stable [name] [id number] DOB: 1975/11/25 DOA: 07/24/2015 PCP: Maricela Curet, MD             Physical Exam: Blood pressure 146/88, pulse 80, temperature 97.9 F (36.6 C), temperature source Oral, resp. rate 20, height 5\' 9"  (1.753 m), weight 126 lb (57.153 kg), SpO2 100 %. neck no JVD no carotid bruits no thyromegaly lungs clear name lungs show prolonged history phase scattered rhonchi mild end expiratory wheeze no rales appreciable heart regular rhythm no S3-S4 no heaves thrills rubs   Investigations:  No results found for this or any previous visit (from the past 240 hour(s)).   Basic Metabolic Panel:  Recent Labs  07/24/15 1948 07/25/15 1159 07/26/15 0608  NA 125*  --  130*  K 3.9  --  3.5  CL 91*  --  97*  CO2 18*  --  25  GLUCOSE 116*  --  125*  BUN 6  --  <5*  CREATININE 0.34*  --  0.30*  CALCIUM 8.9  --  8.3*  MG  --  1.9  --    Liver Function Tests:  Recent Labs  07/24/15 1948  AST 42*  ALT 22  ALKPHOS 104  BILITOT 0.7  PROT 8.2*  ALBUMIN 4.9     CBC:  Recent Labs  07/24/15 1948 07/26/15 0608  WBC 11.5* 7.5  NEUTROABS 8.8* 4.6  HGB 12.9* 12.7*  HCT 37.2* 37.2*  MCV 100.0 102.5*  PLT 236 224    No results found.    Medications:  Impression:  Active Problems:   Alcohol abuse, continuous   Seizure disorder   Abdominal pain   HTN (hypertension)   Pancreatitis   Hyponatremia   Pancreatitis, alcoholic, acute     Plan: Decrease Ativan to 0.5 before meals and at bedtime advance diet to full liquids today monitor lipase in a.m.  Consultants:    Procedures   Antibiotics:                   Code Status:   Family Communication:    Disposition Plan see plan above  Time spent: 30 minutes   LOS: 2 days   [name],[name]   07/26/2015, 12:41 PM

## 2015-07-26 NOTE — Progress Notes (Signed)
Multiple attempts were made by nurse and [facility] to start iv. Notified MD. MD states to leave iv out for now and to give pt 4mg  of IM Morphine every 3 hours prn. Will continue to monitor pt.

## 2015-07-26 NOTE — Progress Notes (Signed)
Pt caught smoking on the unit. Charge nurse confiscated cigarettes and a lighter Pt educated and note left for MD for nicotine patch. Will continue to monitor.

## 2015-07-26 NOTE — Progress Notes (Signed)
Patient requested nicotine patch.  Dr. Cindie Laroche notified.  Received verbal order for 21mg  nicotine patch.  Placed on patient's left arm.

## 2015-07-27 LAB — BASIC METABOLIC PANEL
ANION GAP: 7 (ref 5–15)
BUN: <3 mg/dL — ABNORMAL LOW (ref 6–20)
CALCIUM: 8.3 mg/dL — AB (ref 8.9–10.3)
CHLORIDE: 100 mmol/L — AB (ref 101–111)
CO2: 26 mmol/L (ref 22–32)
Creatinine, Ser: 0.34 mg/dL — ABNORMAL LOW (ref 0.61–1.24)
GFR calc non Af Amer: >60 mL/min (ref >60)
Glucose, Bld: 148 mg/dL — ABNORMAL HIGH (ref 65–99)
POTASSIUM: 3.2 mmol/L — AB (ref 3.5–5.1)
Sodium: 133 mmol/L — ABNORMAL LOW (ref 135–145)

## 2015-07-27 LAB — CBC WITH DIFFERENTIAL/PLATELET
BASOS ABS: 0 10*3/uL (ref 0.0–0.1)
BASOS PCT: 1 %
Eosinophils Absolute: 0.2 10*3/uL (ref 0.0–0.7)
Eosinophils Relative: 2 %
HEMATOCRIT: 33.2 % — AB (ref 39.0–52.0)
HEMOGLOBIN: 11.2 g/dL — AB (ref 13.0–17.0)
LYMPHS PCT: 26 %
Lymphs Abs: 1.8 10*3/uL (ref 0.7–4.0)
MCH: 35.2 pg — ABNORMAL HIGH (ref 26.0–34.0)
MCHC: 33.7 g/dL (ref 30.0–36.0)
MCV: 104.4 fL — ABNORMAL HIGH (ref 78.0–100.0)
Monocytes Absolute: 1.1 10*3/uL — ABNORMAL HIGH (ref 0.1–1.0)
Monocytes Relative: 16 %
NEUTROS ABS: 3.6 10*3/uL (ref 1.7–7.7)
NEUTROS PCT: 55 %
Platelets: 198 10*3/uL (ref 150–400)
RBC: 3.18 MIL/uL — AB (ref 4.22–5.81)
RDW: 11.9 % (ref 11.5–15.5)
WBC: 6.6 10*3/uL (ref 4.0–10.5)

## 2015-07-27 LAB — LIPASE, BLOOD: LIPASE: 33 U/L (ref 22–51)

## 2015-07-27 MED ORDER — PANTOPRAZOLE SODIUM 40 MG PO TBEC
40.0000 mg | DELAYED_RELEASE_TABLET | Freq: Every day | ORAL | Status: DC
  Administered 2015-07-28: 40 mg via ORAL
  Filled 2015-07-27: qty 1

## 2015-07-27 MED ORDER — HYDROMORPHONE HCL 1 MG/ML IJ SOLN
1.0000 mg | INTRAMUSCULAR | Status: DC | PRN
Start: 2015-07-27 — End: 2015-07-28
  Administered 2015-07-27 – 2015-07-28 (×10): 1 mg via INTRAVENOUS
  Filled 2015-07-27 (×11): qty 1

## 2015-07-27 NOTE — Progress Notes (Signed)
Patient tolerating full liquids has some continued epigastric discomfort we'll diminished a lot of 2-1 mg every 3 hours when necessary awaiting this morning's labs [name] [id number] DOB: 09-03-1976 DOA: 07/24/2015 PCP: Jose Curet, MD             Physical Exam: Blood pressure 148/72, pulse 88, temperature 98.5 F (36.9 C), temperature source Oral, resp. rate 20, height 5\' 9"  (1.753 [name]), weight 126 lb (57.153 kg), SpO2 100 %. lungs show prolonged history phase scattered rhonchi mild end expiratory wheeze no rales appreciable. Heart regular rhythm no S3-S4 no heaves feels rubs abdomen mild epigastric tenderness no guarding or rebound or masses no megaly   Investigations:  No results found for this or any previous visit (from the past 240 hour(s)).   Basic Metabolic Panel:  Recent Labs  07/24/15 1948 07/25/15 1159 07/26/15 0608  NA 125*  --  130*  K 3.9  --  3.5  CL 91*  --  97*  CO2 18*  --  25  GLUCOSE 116*  --  125*  BUN 6  --  <5*  CREATININE 0.34*  --  0.30*  CALCIUM 8.9  --  8.3*  MG  --  1.9  --    Liver Function Tests:  Recent Labs  07/24/15 1948  AST 42*  ALT 22  ALKPHOS 104  BILITOT 0.7  PROT 8.2*  ALBUMIN 4.9     CBC:  Recent Labs  07/24/15 1948 07/26/15 0608  WBC 11.5* 7.5  NEUTROABS 8.8* 4.6  HGB 12.9* 12.7*  HCT 37.2* 37.2*  MCV 100.0 102.5*  PLT 236 224    No results found.    Medications:   Impression:  Active Problems:   Alcohol abuse, continuous   Seizure disorder   Abdominal pain   HTN (hypertension)   Pancreatitis   Hyponatremia   Pancreatitis, alcoholic, acute     Plan: Awaiting hepatic profile and lipase today diminished a lot it's from 2-1 mg every 3 hours when necessary continue full liquids advance diet as tolerated  Consultants:    Procedures   Antibiotics:                   Code Status:  Family Communication:    Disposition Plan see plan above  Time spent: 30  minutes   LOS: 3 days   [name]   07/27/2015, 7:03 AM

## 2015-07-28 LAB — BASIC METABOLIC PANEL
ANION GAP: 8 (ref 5–15)
CHLORIDE: 100 mmol/L — AB (ref 101–111)
CO2: 25 mmol/L (ref 22–32)
Calcium: 8.6 mg/dL — ABNORMAL LOW (ref 8.9–10.3)
Creatinine, Ser: 0.37 mg/dL — ABNORMAL LOW (ref 0.61–1.24)
GFR calc non Af Amer: >60 mL/min (ref >60)
GLUCOSE: 111 mg/dL — AB (ref 65–99)
POTASSIUM: 3.4 mmol/L — AB (ref 3.5–5.1)
SODIUM: 133 mmol/L — AB (ref 135–145)

## 2015-07-28 LAB — HEPATIC FUNCTION PANEL
ALK PHOS: 87 U/L (ref 38–126)
ALT: 15 U/L — AB (ref 17–63)
AST: 25 U/L (ref 15–41)
Albumin: 4.2 g/dL (ref 3.5–5.0)
BILIRUBIN DIRECT: 0.1 mg/dL (ref 0.1–0.5)
BILIRUBIN INDIRECT: 0.4 mg/dL (ref 0.3–0.9)
BILIRUBIN TOTAL: 0.5 mg/dL (ref 0.3–1.2)
Total Protein: 7.5 g/dL (ref 6.5–8.1)

## 2015-07-28 LAB — LIPASE, BLOOD: Lipase: 16 U/L — ABNORMAL LOW (ref 22–51)

## 2015-07-28 NOTE — Discharge Summary (Signed)
Physician Discharge Summary  [name] MPN:[id number] DOB: [birth year] DOA: 07/24/2015  PCP: Maricela Curet, MD  Admit date: 07/24/2015 Discharge date: 07/28/2015   Recommendations for Outpatient Follow-up:  Patient is advised to follow-up my office in 3-4 days time to assess lipase epigastric discomfort but I am status [place] electrolytes he is advised to ten-day meetings and he understands this he is advised to never drink alcohol again and to continue with smoking cessation efforts as an outpatient Discharge Diagnoses:  Active Problems:   Alcohol abuse, continuous   Seizure disorder   Abdominal pain   HTN (hypertension)   Pancreatitis   Hyponatremia   Pancreatitis, alcoholic, acute   Discharge Condition: Stable and improving  Filed Weights   07/24/15 1701 07/24/15 2323  Weight: 125 lb (56.7 kg) 126 lb (57.153 kg)    History of present illness:  The patient has a history of recurrent chronic ethanol Hepatitis and pancreatitis he had been drinking a 12 pack per day and began with epigastric discomfort some nausea vomiting and no hematemesis melanoma or hematochezia he was admitted clinically with diagnosis as well as substantially by laboratory data of alcohol Hepatitis pancreatitis placed on nothing by mouth given antiacid therapy he was likewise given Ativan protocol for ethanol withdrawal and thiamine folic acid multivitamins the patient had no evidence of seizures on his Ativan protocol this was weaned he was put on IV Dilaudid for his epigastric discomfort and this resolved her 3-4 day. His diet was advanced from clear to full liquids and then to regular diet on the day of discharge he tolerated this well and had no significant increase in any discomfort abdominally with ingestion of food he was told to follow-up my office in 3-4 daytime 3-4 day time to assess electrolytes epigastric discomfort likewise he was told to attend AA 9 never to drink alcohol again and to  continue outpatient smoking cessation program  Hospital Course:  See history of present illness  Procedures:    Consultations:    Discharge Instructions  Discharge Instructions    Discharge instructions    Complete by:  As directed      Discharge patient    Complete by:  As directed             Medication List    STOP taking these medications        pantoprazole 20 MG tablet  Commonly known as:  PROTONIX     predniSONE 20 MG tablet  Commonly known as:  DELTASONE     promethazine 25 MG tablet  Commonly known as:  PHENERGAN     ranitidine 150 MG capsule  Commonly known as:  ZANTAC      TAKE these medications        albuterol 108 (90 BASE) MCG/ACT inhaler  Commonly known as:  PROVENTIL HFA;VENTOLIN HFA  Inhale 2 puffs into the lungs every 6 (six) hours as needed for wheezing or shortness of breath.     albuterol (2.5 MG/3ML) 0.083% nebulizer solution  Commonly known as:  PROVENTIL  Take 3 mLs (2.5 mg total) by nebulization every 4 (four) hours as needed for wheezing or shortness of breath.     carbamazepine 200 MG tablet  Commonly known as:  TEGRETOL  Take 400 mg by mouth 2 (two) times daily.     Fluticasone-Salmeterol 250-50 MCG/DOSE Aepb  Commonly known as:  ADVAIR  Inhale 1 puff into the lungs 2 (two) times daily.     levETIRAcetam 500  MG tablet  Commonly known as:  KEPPRA  Take 500 mg by mouth 2 (two) times daily.     omeprazole 20 MG capsule  Commonly known as:  PRILOSEC  Take 1 po BID x 2 weeks then once a day     ondansetron 4 MG disintegrating tablet  Commonly known as:  ZOFRAN ODT  Take 1 tablet (4 mg total) by mouth every 8 (eight) hours as needed for nausea or vomiting.     oxyCODONE-acetaminophen 10-325 MG tablet  Commonly known as:  PERCOCET  Take 1 tablet by mouth every 6 (six) hours as needed for pain.       No Known Allergies    The results of significant diagnostics from this hospitalization (including imaging,  microbiology, ancillary and laboratory) are listed below for reference.    Significant Diagnostic Studies: No results found.  Microbiology: No results found for this or any previous visit (from the past 240 hour(s)).   Labs: Basic Metabolic Panel:  Recent Labs Lab 07/24/15 1948 07/25/15 1159 07/26/15 0608 07/27/15 0559 07/28/15 0532  NA 125*  --  130* 133* 133*  K 3.9  --  3.5 3.2* 3.4*  CL 91*  --  97* 100* 100*  CO2 18*  --  25 26 25   GLUCOSE 116*  --  125* 148* 111*  BUN 6  --  <5* <3* <5*  CREATININE 0.34*  --  0.30* 0.34* 0.37*  CALCIUM 8.9  --  8.3* 8.3* 8.6*  MG  --  1.9  --   --   --    Liver Function Tests:  Recent Labs Lab 07/24/15 1948 07/28/15 0532  AST 42* 25  ALT 22 15*  ALKPHOS 104 87  BILITOT 0.7 0.5  PROT 8.2* 7.5  ALBUMIN 4.9 4.2    Recent Labs Lab 07/24/15 1948 07/25/15 0527 07/26/15 0608 07/27/15 0559 07/28/15 0532  LIPASE 600* 269* 112* 33 16*   No results for input(s): AMMONIA in the last 168 hours. CBC:  Recent Labs Lab 07/24/15 1948 07/26/15 0608 07/27/15 0559  WBC 11.5* 7.5 6.6  NEUTROABS 8.8* 4.6 3.6  HGB 12.9* 12.7* 11.2*  HCT 37.2* 37.2* 33.2*  MCV 100.0 102.5* 104.4*  PLT 236 224 198   Cardiac Enzymes:  Recent Labs Lab 07/24/15 1845 07/25/15 0006 07/25/15 0527 07/25/15 1159  TROPONINI <0.03 <0.03 <0.03 <0.03   BNP: BNP (last 3 results) No results for input(s): BNP in the last 8760 hours.  ProBNP (last 3 results) No results for input(s): PROBNP in the last 8760 hours.  CBG: No results for input(s): GLUCAP in the last 168 hours.     Signed:  [name],RICHARD Jerilynn Mages  [facility] Pager: [phone] 07/28/2015, 12:48 PM

## 2015-07-28 NOTE — Care Management Note (Signed)
Case Management Note  Patient Details  Name: [name] MRN: [id number] Date of Birth: June 26, 1976  Expected Discharge Date:  07/27/15               Expected Discharge Plan:  Home/Self Care  In-House Referral:  NA  Discharge planning Services  CM Consult  Post Acute Care Choice:  NA Choice offered to:  NA  DME Arranged:    DME Agency:     HH Arranged:    [place] Agency:     Status of Service:  Completed, signed off  Medicare Important Message Given:    Date Medicare IM Given:    Medicare IM give by:    Date Additional Medicare IM Given:    Additional Medicare Important Message give by:     If discussed at [place] of Stay Meetings, dates discussed:    Additional Comments: Pt discharge home with self care today. No CM needs.  Sherald Barge, RN 07/28/2015, 1:50 PM

## 2015-10-09 ENCOUNTER — Encounter (HOSPITAL_COMMUNITY): Payer: Self-pay | Admitting: Emergency Medicine

## 2015-10-09 ENCOUNTER — Inpatient Hospital Stay (HOSPITAL_COMMUNITY)
Admission: EM | Admit: 2015-10-09 | Discharge: 2015-10-11 | DRG: 641 | Disposition: A | Discharge status: Home / Self Care | Payer: Medicaid Other | Attending: Family Medicine | Admitting: Family Medicine

## 2015-10-09 ENCOUNTER — Emergency Department (HOSPITAL_COMMUNITY): Payer: Medicaid Other

## 2015-10-09 DIAGNOSIS — Z720 Tobacco use: Secondary | ICD-10-CM

## 2015-10-09 DIAGNOSIS — R197 Diarrhea, unspecified: Secondary | ICD-10-CM

## 2015-10-09 DIAGNOSIS — Z811 Family history of alcohol abuse and dependence: Secondary | ICD-10-CM | POA: Diagnosis not present

## 2015-10-09 DIAGNOSIS — J410 Simple chronic bronchitis: Secondary | ICD-10-CM

## 2015-10-09 DIAGNOSIS — K219 Gastro-esophageal reflux disease without esophagitis: Secondary | ICD-10-CM | POA: Diagnosis present

## 2015-10-09 DIAGNOSIS — R062 Wheezing: Secondary | ICD-10-CM

## 2015-10-09 DIAGNOSIS — K852 Alcohol induced acute pancreatitis without necrosis or infection: Secondary | ICD-10-CM

## 2015-10-09 DIAGNOSIS — F17210 Nicotine dependence, cigarettes, uncomplicated: Secondary | ICD-10-CM | POA: Diagnosis present

## 2015-10-09 DIAGNOSIS — J449 Chronic obstructive pulmonary disease, unspecified: Secondary | ICD-10-CM | POA: Diagnosis present

## 2015-10-09 DIAGNOSIS — F1010 Alcohol abuse, uncomplicated: Secondary | ICD-10-CM | POA: Diagnosis present

## 2015-10-09 DIAGNOSIS — G8929 Other chronic pain: Secondary | ICD-10-CM | POA: Diagnosis present

## 2015-10-09 DIAGNOSIS — E871 Hypo-osmolality and hyponatremia: Principal | ICD-10-CM | POA: Diagnosis present

## 2015-10-09 DIAGNOSIS — M549 Dorsalgia, unspecified: Secondary | ICD-10-CM | POA: Diagnosis present

## 2015-10-09 DIAGNOSIS — Z8249 Family history of ischemic heart disease and other diseases of the circulatory system: Secondary | ICD-10-CM

## 2015-10-09 DIAGNOSIS — G40909 Epilepsy, unspecified, not intractable, without status epilepticus: Secondary | ICD-10-CM | POA: Diagnosis present

## 2015-10-09 DIAGNOSIS — R109 Unspecified abdominal pain: Secondary | ICD-10-CM

## 2015-10-09 LAB — CBC WITH DIFFERENTIAL/PLATELET
Basophils Absolute: 0 10*3/uL (ref 0.0–0.1)
Basophils Relative: 0 %
Eosinophils Absolute: 0.2 10*3/uL (ref 0.0–0.7)
Eosinophils Relative: 1 %
HEMATOCRIT: 46 % (ref 39.0–52.0)
HEMOGLOBIN: 16.7 g/dL (ref 13.0–17.0)
LYMPHS ABS: 3 10*3/uL (ref 0.7–4.0)
Lymphocytes Relative: 25 %
MCH: 35.1 pg — AB (ref 26.0–34.0)
MCHC: 36.3 g/dL — AB (ref 30.0–36.0)
MCV: 96.6 fL (ref 78.0–100.0)
MONOS PCT: 12 %
Monocytes Absolute: 1.5 10*3/uL — ABNORMAL HIGH (ref 0.1–1.0)
NEUTROS ABS: 7.4 10*3/uL (ref 1.7–7.7)
NEUTROS PCT: 62 %
Platelets: 240 10*3/uL (ref 150–400)
RBC: 4.76 MIL/uL (ref 4.22–5.81)
RDW: 12.5 % (ref 11.5–15.5)
WBC: 12 10*3/uL — ABNORMAL HIGH (ref 4.0–10.5)

## 2015-10-09 LAB — COMPREHENSIVE METABOLIC PANEL
ALK PHOS: 119 U/L (ref 38–126)
ALT: 19 U/L (ref 17–63)
ANION GAP: 13 (ref 5–15)
AST: 33 U/L (ref 15–41)
Albumin: 4.8 g/dL (ref 3.5–5.0)
BILIRUBIN TOTAL: 1.1 mg/dL (ref 0.3–1.2)
BUN: 6 mg/dL (ref 6–20)
CALCIUM: 9.3 mg/dL (ref 8.9–10.3)
CO2: 24 mmol/L (ref 22–32)
Chloride: 84 mmol/L — ABNORMAL LOW (ref 101–111)
Creatinine, Ser: 0.45 mg/dL — ABNORMAL LOW (ref 0.61–1.24)
GFR calc non Af Amer: >60 mL/min (ref >60)
GLUCOSE: 120 mg/dL — AB (ref 65–99)
Potassium: 4.8 mmol/L (ref 3.5–5.1)
Sodium: 121 mmol/L — ABNORMAL LOW (ref 135–145)
TOTAL PROTEIN: 9 g/dL — AB (ref 6.5–8.1)

## 2015-10-09 LAB — C DIFFICILE QUICK SCREEN W PCR REFLEX
C DIFFICILE (CDIFF) INTERP: NEGATIVE
C DIFFICILE (CDIFF) TOXIN: NEGATIVE
C Diff antigen: NEGATIVE

## 2015-10-09 LAB — ETHANOL

## 2015-10-09 LAB — OSMOLALITY: OSMOLALITY: 257 mosm/kg — AB (ref 275–295)

## 2015-10-09 LAB — RAPID URINE DRUG SCREEN, HOSP PERFORMED
Amphetamines: NOT DETECTED
Barbiturates: NOT DETECTED
Benzodiazepines: POSITIVE — AB
Cocaine: NOT DETECTED
OPIATES: NOT DETECTED
TETRAHYDROCANNABINOL: NOT DETECTED

## 2015-10-09 LAB — LIPASE, BLOOD: Lipase: 26 U/L (ref 11–51)

## 2015-10-09 MED ORDER — HEPARIN SODIUM (PORCINE) 5000 UNIT/ML IJ SOLN
5000.0000 [IU] | Freq: Three times a day (TID) | INTRAMUSCULAR | Status: DC
  Administered 2015-10-09 – 2015-10-11 (×6): 5000 [IU] via SUBCUTANEOUS
  Filled 2015-10-09 (×6): qty 1

## 2015-10-09 MED ORDER — LEVETIRACETAM 500 MG PO TABS
500.0000 mg | ORAL_TABLET | Freq: Two times a day (BID) | ORAL | Status: DC
  Administered 2015-10-09 – 2015-10-11 (×5): 500 mg via ORAL
  Filled 2015-10-09 (×5): qty 1

## 2015-10-09 MED ORDER — THIAMINE HCL 100 MG/ML IJ SOLN
100.0000 mg | Freq: Every day | INTRAMUSCULAR | Status: DC
Start: 2015-10-09 — End: 2015-10-11

## 2015-10-09 MED ORDER — SODIUM CHLORIDE 0.9 % IJ SOLN
3.0000 mL | Freq: Two times a day (BID) | INTRAMUSCULAR | Status: DC
  Administered 2015-10-10 – 2015-10-11 (×2): 3 mL via INTRAVENOUS

## 2015-10-09 MED ORDER — CARBAMAZEPINE 200 MG PO TABS
400.0000 mg | ORAL_TABLET | Freq: Two times a day (BID) | ORAL | Status: DC
  Administered 2015-10-09 – 2015-10-11 (×5): 400 mg via ORAL
  Filled 2015-10-09 (×5): qty 2

## 2015-10-09 MED ORDER — ONDANSETRON HCL 4 MG PO TABS: 4.0000 mg | ORAL_TABLET | Freq: Four times a day (QID) | ORAL | Status: DC | PRN

## 2015-10-09 MED ORDER — MOMETASONE FURO-FORMOTEROL FUM 100-5 MCG/ACT IN AERO
2.0000 | INHALATION_SPRAY | Freq: Two times a day (BID) | RESPIRATORY_TRACT | Status: DC
  Administered 2015-10-09 – 2015-10-11 (×4): 2 via RESPIRATORY_TRACT
  Filled 2015-10-09: qty 8.8

## 2015-10-09 MED ORDER — ADULT MULTIVITAMIN W/MINERALS CH
1.0000 | ORAL_TABLET | Freq: Every day | ORAL | Status: DC
  Administered 2015-10-09 – 2015-10-11 (×3): 1 via ORAL
  Filled 2015-10-09 (×3): qty 1

## 2015-10-09 MED ORDER — OXYCODONE HCL 5 MG PO TABS
5.0000 mg | ORAL_TABLET | Freq: Once | ORAL | Status: AC
Start: 2015-10-09 — End: 2015-10-09
  Administered 2015-10-09: 5 mg via ORAL
  Filled 2015-10-09: qty 1

## 2015-10-09 MED ORDER — VITAMIN B-1 100 MG PO TABS
100.0000 mg | ORAL_TABLET | Freq: Every day | ORAL | Status: DC
  Administered 2015-10-09 – 2015-10-11 (×3): 100 mg via ORAL
  Filled 2015-10-09 (×3): qty 1

## 2015-10-09 MED ORDER — ALBUTEROL SULFATE (2.5 MG/3ML) 0.083% IN NEBU: 2.5000 mg | INHALATION_SOLUTION | RESPIRATORY_TRACT | Status: DC | PRN

## 2015-10-09 MED ORDER — OXYCODONE HCL 5 MG PO TABS
5.0000 mg | ORAL_TABLET | Freq: Four times a day (QID) | ORAL | Status: DC | PRN
  Administered 2015-10-09: 5 mg via ORAL
  Filled 2015-10-09: qty 1

## 2015-10-09 MED ORDER — ONDANSETRON HCL 4 MG/2ML IJ SOLN
4.0000 mg | Freq: Once | INTRAMUSCULAR | Status: AC
  Administered 2015-10-09: 4 mg via INTRAVENOUS
  Filled 2015-10-09: qty 2

## 2015-10-09 MED ORDER — LORAZEPAM 1 MG PO TABS
1.0000 mg | ORAL_TABLET | Freq: Four times a day (QID) | ORAL | Status: DC | PRN
  Administered 2015-10-10 – 2015-10-11 (×4): 1 mg via ORAL
  Filled 2015-10-09 (×4): qty 1

## 2015-10-09 MED ORDER — SODIUM CHLORIDE 0.9 % IV SOLN
INTRAVENOUS | Status: DC
  Administered 2015-10-09: 10:00:00 via INTRAVENOUS

## 2015-10-09 MED ORDER — ONDANSETRON HCL 4 MG/2ML IJ SOLN: 4.0000 mg | Freq: Four times a day (QID) | INTRAMUSCULAR | Status: DC | PRN

## 2015-10-09 MED ORDER — SODIUM CHLORIDE 0.9 % IV BOLUS (SEPSIS)
1000.0000 mL | Freq: Once | INTRAVENOUS | Status: AC
  Administered 2015-10-09: 1000 mL via INTRAVENOUS

## 2015-10-09 MED ORDER — ACETAMINOPHEN 325 MG PO TABS: 650.0000 mg | ORAL_TABLET | Freq: Four times a day (QID) | ORAL | Status: DC | PRN

## 2015-10-09 MED ORDER — ACETAMINOPHEN 650 MG RE SUPP: 650.0000 mg | Freq: Four times a day (QID) | RECTAL | Status: DC | PRN

## 2015-10-09 MED ORDER — OXYCODONE HCL 5 MG PO TABS
10.0000 mg | ORAL_TABLET | Freq: Four times a day (QID) | ORAL | Status: DC | PRN
  Administered 2015-10-09 – 2015-10-11 (×7): 10 mg via ORAL
  Filled 2015-10-09 (×7): qty 2

## 2015-10-09 MED ORDER — LORAZEPAM 2 MG/ML IJ SOLN: 1.0000 mg | Freq: Four times a day (QID) | INTRAMUSCULAR | Status: DC | PRN

## 2015-10-09 MED ORDER — FOLIC ACID 1 MG PO TABS
1.0000 mg | ORAL_TABLET | Freq: Every day | ORAL | Status: DC
  Administered 2015-10-09 – 2015-10-11 (×3): 1 mg via ORAL
  Filled 2015-10-09 (×3): qty 1

## 2015-10-09 MED ORDER — SODIUM CHLORIDE 0.9 % IV SOLN
INTRAVENOUS | Status: DC
  Administered 2015-10-09 – 2015-10-11 (×4): via INTRAVENOUS

## 2015-10-09 NOTE — ED Notes (Signed)
MD at bedside. 

## 2015-10-09 NOTE — ED Notes (Signed)
Report given to [employer]. For room 339 at this time.

## 2015-10-09 NOTE — ED Notes (Signed)
PT ambulatory to bathroom at this time. 

## 2015-10-09 NOTE — H&P (Addendum)
Patient Demographics  [name], is a 39 y.o. male  MRN: [id number]   DOB - 12-11-1975  Admit Date - 10/09/2015  Outpatient Primary MD for the patient is Jose Curet, MD   With History of -  Past Medical History  Diagnosis Date  . Pancreatitis   . Seizures ([place])   . Back pain   . ETOH abuse   . COPD (chronic obstructive pulmonary disease) ([place])   . SAH (subarachnoid hemorrhage) ([place])   . Macrocytosis 08/17/2011  . Bradycardia 08/17/2011  . Pneumonia   . GERD (gastroesophageal reflux disease)       Past Surgical History  Procedure Laterality Date  . Back surgery    . Septoplasty      in for   Chief Complaint  Patient presents with  . Diarrhea     HPI  [name]  is a 39 y.o. male, with past medical history of alcohol abuse, chronic back pain, alcohol induced pancreatitis, seizures, COPD, subarachnoid hemorrhage, pneumonia and GERD, resents with complaints of nausea, diarrhea, reports symptoms started yesterday morning, reports 5 watery bowel movements yesterday, 2 watery bowel movements today, denies any vomiting, no coffee-ground emesis, no bright red blood per rectum, as well reports generalized weakness, in ED workup significant for hyponatremia with sodium of 121, patient reports he drink six beers on a daily basis, rest was yesterday, reports as well he is been drinking a lot of water as he has not been able to tolerate oral intake, denies any chest pain, shortness of breath, fever, hospitalist requested to admit.    Review of Systems    In addition to the HPI above,  No Fever-chills, No Headache, No changes with Vision or hearing, No problems swallowing food or Liquids, No Chest pain, Cough or Shortness of Breath, mild Abdominal pain, No  Vommitting, reports mild nausea and diarrhea , No Blood in stool or Urine, No dysuria, No new skin rashes or bruises, No new joints pains-aches, complains of chronic lower back pain No new weakness,  tingling, numbness in any extremity, No recent weight gain or loss, No polyuria, polydypsia or polyphagia, No significant Mental Stressors.  A full 10 point Review of Systems was done, except as stated above, all other Review of Systems were negative.   Social History Social History  Substance Use Topics  . Smoking status: Current Every Day Smoker -- 1.00 packs/day for 20 years    Types: Cigarettes  . Smokeless tobacco: Never Used  . Alcohol Use: 25.2 oz/week    42 Cans of beer per week     Comment: daily, at least 18 beers a day     Family History Family History  Problem Relation Age of Onset  . Seizures Father   . Alcohol abuse Father   . Coronary artery disease Mother     deceased age 3  . Colon cancer Neg Hx      Prior to Admission medications   Medication Sig Start Date End Date Taking? Authorizing Provider  albuterol (PROVENTIL HFA;VENTOLIN HFA) 108 (90 BASE) MCG/ACT inhaler Inhale 2 puffs into the lungs every 6 (six) hours as needed for wheezing or shortness of breath.   Yes Historical Provider, MD  albuterol (PROVENTIL) (2.5 MG/3ML) 0.083% nebulizer solution Take 3 mLs (2.5 mg total) by nebulization every 4 (four) hours as needed for wheezing or shortness of breath. 09/09/13  Yes Jose Greek, MD  carbamazepine (TEGRETOL) 200 MG tablet Take 400 mg by mouth 2 (  two) times daily.   Yes Historical Provider, MD  Fluticasone-Salmeterol (ADVAIR) 250-50 MCG/DOSE AEPB Inhale 1 puff into the lungs 2 (two) times daily.   Yes Historical Provider, MD  levETIRAcetam (KEPPRA) 500 MG tablet Take 500 mg by mouth 2 (two) times daily.    Yes Historical Provider, MD  omeprazole (PRILOSEC) 20 MG capsule Take 1 po BID x 2 weeks then once a day 07/18/15  Yes Jose Porter, MD  oxyCODONE-acetaminophen (PERCOCET) 10-325 MG per tablet Take 1 tablet by mouth every 6 (six) hours as needed for pain.    Yes Historical Provider, MD  ondansetron (ZOFRAN ODT) 4 MG disintegrating tablet Take 1  tablet (4 mg total) by mouth every 8 (eight) hours as needed for nausea or vomiting. Patient not taking: Reported on 10/09/2015 07/18/15   Jose Porter, MD    No Known Allergies  Physical Exam  Vitals  Blood pressure 154/72, pulse 80, temperature 97.6 F (36.4 C), temperature source Oral, resp. rate 18, height 5\' 9"  (1.753 m), weight 63.504 kg (140 lb), SpO2 99 %.   1. General well-developed male lying in bed in NAD,    2. Normal affect and insight, Not Suicidal or Homicidal, Awake Alert, Oriented X 3.  3. No F.N deficits, ALL C.Nerves Intact, Strength 5/5 all 4 extremities, Sensation intact all 4 extremities, Plantars down going.  4. Ears and Eyes appear Normal, Conjunctivae clear, PERRLA. Moist Oral Mucosa.  5. Supple Neck, No JVD, No cervical lymphadenopathy appriciated, No Carotid Bruits.  6. Symmetrical Chest wall movement, Good air movement bilaterally, CTAB.  7. RRR, No Gallops, Rubs or Murmurs, No Parasternal Heave.  8. Positive Bowel Sounds, Abdomen Soft, mild tenderness, No organomegaly appriciated,No rebound -guarding or rigidity.  9.  No Cyanosis, Normal Skin Turgor, No Skin Rash or Bruise.  10. Good muscle tone,  joints appear normal , no effusions, Normal ROM.  11. No Palpable Lymph Nodes in Neck or Axillae   Data Review  CBC  Recent Labs Lab 10/09/15 0910  WBC 12.0*  HGB 16.7  HCT 46.0  PLT 240  MCV 96.6  MCH 35.1*  MCHC 36.3*  RDW 12.5  LYMPHSABS 3.0  MONOABS 1.5*  EOSABS 0.2  BASOSABS 0.0   ------------------------------------------------------------------------------------------------------------------  Chemistries   Recent Labs Lab 10/09/15 0910  NA 121*  K 4.8  CL 84*  CO2 24  GLUCOSE 120*  BUN 6  CREATININE 0.45*  CALCIUM 9.3  AST 33  ALT 19  ALKPHOS 119  BILITOT 1.1   ------------------------------------------------------------------------------------------------------------------ estimated creatinine clearance is 110.2  mL/min (by C-G formula based on Cr of 0.45). ------------------------------------------------------------------------------------------------------------------ No results for input(s): TSH, T4TOTAL, T3FREE, THYROIDAB in the last 72 hours.  Invalid input(s): FREET3   Coagulation profile No results for input(s): INR, PROTIME in the last 168 hours. ------------------------------------------------------------------------------------------------------------------- No results for input(s): DDIMER in the last 72 hours. -------------------------------------------------------------------------------------------------------------------  Cardiac Enzymes No results for input(s): CKMB, TROPONINI, MYOGLOBIN in the last 168 hours.  Invalid input(s): CK ------------------------------------------------------------------------------------------------------------------ Invalid input(s): POCBNP   ---------------------------------------------------------------------------------------------------------------  Urinalysis    Component Value Date/Time   COLORURINE YELLOW 07/18/2015 [place] 07/18/2015 0549   LABSPEC 1.015 07/18/2015 0549   PHURINE 6.0 07/18/2015 [place] 07/18/2015 0549   HGBUR NEGATIVE 07/18/2015 0549   BILIRUBINUR NEGATIVE 07/18/2015 0549   KETONESUR NEGATIVE 07/18/2015 0549   PROTEINUR NEGATIVE 07/18/2015 0549   UROBILINOGEN 0.2 07/18/2015 0549   NITRITE NEGATIVE 07/18/2015 0549   LEUKOCYTESUR NEGATIVE 07/18/2015 0549    ----------------------------------------------------------------------------------------------------------------  Imaging results:   Dg Abd Acute W/chest  10/09/2015  CLINICAL DATA:  Abdominal pain and diarrhea EXAM: DG ABDOMEN ACUTE W/ 1V CHEST COMPARISON:  03/09/2015 FINDINGS: Cardiac shadow is within normal limits. The lungs are well aerated bilaterally. Biapical scarring is again seen and stable. Scattered large and small  bowel gas is noted. Chronic pancreatic calcifications are noted consistent with previous pancreatitis. No obstructive changes are seen. No free air is noted. The bony structures are within normal limits. IMPRESSION: No acute abnormality noted. Electronically Signed   By: Inez Catalina M.D.   On: 10/09/2015 10:54       Assessment & Plan  Principal Problem:   Hyponatremia Active Problems:   Alcohol abuse, continuous   Tobacco abuse   Seizure disorder (HCC)   COPD (chronic obstructive pulmonary disease) (HCC)   Diarrhea  Hyponatremia - Patient with sodium of 121 in ED, has been low in the past secondary to beer potomania during previous admissions, this time picture might be multifactorial secondary to volume depletion, and polydipsia(as he increased water intake trying to remain hydrated), will start on IV normal saline 75 mL/h, check serum osmolality, and urine osmolality, recheck BMP in a.m..  COPD - No active wheezing, continue with dulera and when necessary nebs. Alcohol abuse - Patient was counseled, will be started on CIWA protocol.  Diarrhea -  likely related to gastroenteritis, no acute finding on abdominal x-ray, continue with IV fluids  Tobacco abuse - Counseled,   Seizures - Continue home medication  Chronic back pain - Continue with when necessary pain meds  DVT Prophylaxis   Lovenox   AM Labs Ordered, also please review Full Orders  Family Communication: Admission, patients condition and plan of care including tests being ordered have been discussed with the patient  who indicate understanding and agree with the plan and Code Status.  Code Status Full  Likely DC to  Home  Condition GUARDED    Time spent in minutes : 50 minutes    Wesam Gearhart M.D on 10/09/2015 at 1:34 PM  Between 7am to 7pm - Pager - [phone]  After 7pm go to [email or web address] - password [email or web address]  And look for the night coverage person covering me after hours  [facility]  Group Office  [phone]

## 2015-10-09 NOTE — ED Notes (Signed)
Patient c/o diarrhea with body cramps that started yesterday. Per patient this feels like the "time he had the flu." Unsure of any fevers.

## 2015-10-09 NOTE — ED Provider Notes (Signed)
CSN: [id number]     Arrival date & time 10/09/15  [id number] History  By signing my name below, I, [facility], attest that this documentation has been prepared under the direction and in the presence of Fredia Sorrow, MD. Electronically Signed: Virgel Bouquet, ED Scribe. 10/09/2015. 1:03 PM.    Chief Complaint  Patient presents with  . Diarrhea   The history is provided by the patient. No language interpreter was used.   HPI Comments: [name] is a 39 y.o. male who presents to the Emergency Department with a hx of chronic abdominal pain and acute alcohol-induced pancreatisis complaining of diarrhea that began 1 day ago. Patient endorses associated nausea and abdominal pain since onset but notes that he frequently has abdominal pain that he associates with his pacreatitis. Per patient, he notes 5 bowel movements today and feels as if "water rushes right through him". He notes similar symptoms in the past. Patient denies blood in stool and vomiting.  Past Medical History  Diagnosis Date  . Pancreatitis   . Seizures ([place])   . Back pain   . ETOH abuse   . COPD (chronic obstructive pulmonary disease) ([place])   . SAH (subarachnoid hemorrhage) ([place])   . Macrocytosis 08/17/2011  . Bradycardia 08/17/2011  . Pneumonia   . GERD (gastroesophageal reflux disease)    Past Surgical History  Procedure Laterality Date  . Back surgery    . Septoplasty     Family History  Problem Relation Age of Onset  . Seizures Father   . Alcohol abuse Father   . Coronary artery disease Mother     deceased age 48  . Colon cancer Neg Hx    Social History  Substance Use Topics  . Smoking status: Current Every Day Smoker -- 1.00 packs/day for 20 years    Types: Cigarettes  . Smokeless tobacco: Never Used  . Alcohol Use: 25.2 oz/week    42 Cans of beer per week     Comment: daily, at least 18 beers a day     Review of Systems  Constitutional: Positive for fever and chills.  HENT:  Positive for congestion. Negative for rhinorrhea and sore throat.   Eyes: Negative for photophobia and visual disturbance.  Respiratory: Positive for shortness of breath. Negative for cough.   Cardiovascular: Positive for chest pain. Negative for leg swelling.  Gastrointestinal: Positive for nausea and diarrhea. Negative for vomiting and blood in stool.  Genitourinary: Negative for dysuria and hematuria.  Musculoskeletal: Positive for back pain.  Skin: Negative for rash.  Neurological: Negative for headaches.  Hematological: Does not bruise/bleed easily.  Psychiatric/Behavioral: Negative for confusion.      Allergies  Review of patient's allergies indicates no known allergies.  Home Medications   Prior to Admission medications   Medication Sig Start Date End Date Taking? Authorizing Provider  albuterol (PROVENTIL HFA;VENTOLIN HFA) 108 (90 BASE) MCG/ACT inhaler Inhale 2 puffs into the lungs every 6 (six) hours as needed for wheezing or shortness of breath.   Yes Historical Provider, MD  albuterol (PROVENTIL) (2.5 MG/3ML) 0.083% nebulizer solution Take 3 mLs (2.5 mg total) by nebulization every 4 (four) hours as needed for wheezing or shortness of breath. 09/09/13  Yes Orpah Greek, MD  carbamazepine (TEGRETOL) 200 MG tablet Take 400 mg by mouth 2 (two) times daily.   Yes Historical Provider, MD  Fluticasone-Salmeterol (ADVAIR) 250-50 MCG/DOSE AEPB Inhale 1 puff into the lungs 2 (two) times daily.   Yes Historical Provider, MD  levETIRAcetam (KEPPRA) 500 MG tablet Take 500 mg by mouth 2 (two) times daily.    Yes Historical Provider, MD  omeprazole (PRILOSEC) 20 MG capsule Take 1 po BID x 2 weeks then once a day 07/18/15  Yes Rolland Porter, MD  oxyCODONE-acetaminophen (PERCOCET) 10-325 MG per tablet Take 1 tablet by mouth every 6 (six) hours as needed for pain.    Yes Historical Provider, MD  ondansetron (ZOFRAN ODT) 4 MG disintegrating tablet Take 1 tablet (4 mg total) by mouth every 8  (eight) hours as needed for nausea or vomiting. Patient not taking: Reported on 10/09/2015 07/18/15   Rolland Porter, MD   BP 160/69 mmHg  Pulse 81  Temp(Src) 97.6 F (36.4 C) (Oral)  Resp 20  Ht 5\' 9"  (1.753 m)  Wt 63.504 kg  BMI 20.67 kg/m2  SpO2 99% Physical Exam  Constitutional: He is oriented to person, place, and time. He appears well-developed and well-nourished. No distress.  HENT:  Head: Normocephalic and atraumatic.  Eyes: Conjunctivae and EOM are normal. Pupils are equal, round, and reactive to light. No scleral icterus.  Pupils are normal. Scleras are clear.  Neck: Neck supple. No tracheal deviation present.  Cardiovascular: Normal rate, regular rhythm and normal heart sounds.   No murmur heard. Pulmonary/Chest: Effort normal and breath sounds normal. No respiratory distress. He has no wheezes.  Room air stats 99.  Abdominal: Soft. Bowel sounds are normal. There is tenderness (Generalized). There is no guarding.  Generalized tenderness to the abdomen.  Musculoskeletal: Normal range of motion. He exhibits no edema (BLE).  Neurological: He is alert and oriented to person, place, and time. He has normal reflexes. No cranial nerve deficit. He exhibits normal muscle tone. Coordination normal.  Skin: Skin is warm and dry.  Psychiatric: He has a normal mood and affect. His behavior is normal.  Nursing note and vitals reviewed.   ED Course  Procedures   DIAGNOSTIC STUDIES: Oxygen Saturation is 97% on RA, normal by my interpretation.    COORDINATION OF CARE: 9:48 AM Discussed treatment plan with pt at bedside and pt agreed to plan.  Labs Review Labs Reviewed  CBC WITH DIFFERENTIAL/PLATELET - Abnormal; Notable for the following:    WBC 12.0 (*)    MCH 35.1 (*)    MCHC 36.3 (*)    Monocytes Absolute 1.5 (*)    All other components within normal limits  COMPREHENSIVE METABOLIC PANEL - Abnormal; Notable for the following:    Sodium 121 (*)    Chloride 84 (*)    Glucose,  Bld 120 (*)    Creatinine, Ser 0.45 (*)    Total Protein 9.0 (*)    All other components within normal limits  URINE RAPID DRUG SCREEN, HOSP PERFORMED - Abnormal; Notable for the following:    Benzodiazepines POSITIVE (*)    All other components within normal limits  C DIFFICILE QUICK SCREEN W PCR REFLEX  LIPASE, BLOOD  ETHANOL   Results for orders placed or performed during the hospital encounter of 10/09/15  C difficile quick scan w PCR reflex  Result Value Ref Range   C Diff antigen NEGATIVE NEGATIVE   C Diff toxin NEGATIVE NEGATIVE   C Diff interpretation Negative for toxigenic C. difficile   CBC with Differential  Result Value Ref Range   WBC 12.0 (H) 4.0 - 10.5 K/uL   RBC 4.76 4.22 - 5.81 MIL/uL   Hemoglobin 16.7 13.0 - 17.0 g/dL   HCT 46.0 39.0 - 52.0 %  MCV 96.6 78.0 - 100.0 fL   MCH 35.1 (H) 26.0 - 34.0 pg   MCHC 36.3 (H) 30.0 - 36.0 g/dL   RDW 12.5 11.5 - 15.5 %   Platelets 240 150 - 400 K/uL   Neutrophils Relative % 62 %   Neutro Abs 7.4 1.7 - 7.7 K/uL   Lymphocytes Relative 25 %   Lymphs Abs 3.0 0.7 - 4.0 K/uL   Monocytes Relative 12 %   Monocytes Absolute 1.5 (H) 0.1 - 1.0 K/uL   Eosinophils Relative 1 %   Eosinophils Absolute 0.2 0.0 - 0.7 K/uL   Basophils Relative 0 %   Basophils Absolute 0.0 0.0 - 0.1 K/uL  Comprehensive metabolic panel  Result Value Ref Range   Sodium 121 (L) 135 - 145 mmol/L   Potassium 4.8 3.5 - 5.1 mmol/L   Chloride 84 (L) 101 - 111 mmol/L   CO2 24 22 - 32 mmol/L   Glucose, Bld 120 (H) 65 - 99 mg/dL   BUN 6 6 - 20 mg/dL   Creatinine, Ser 0.45 (L) 0.61 - 1.24 mg/dL   Calcium 9.3 8.9 - 10.3 mg/dL   Total Protein 9.0 (H) 6.5 - 8.1 g/dL   Albumin 4.8 3.5 - 5.0 g/dL   AST 33 15 - 41 U/L   ALT 19 17 - 63 U/L   Alkaline Phosphatase 119 38 - 126 U/L   Total Bilirubin 1.1 0.3 - 1.2 mg/dL   GFR calc non Af Amer >60 >60 mL/min   GFR calc Af Amer >60 >60 mL/min   Anion gap 13 5 - 15  Lipase, blood  Result Value Ref Range   Lipase  26 11 - 51 U/L  Ethanol  Result Value Ref Range   Alcohol, Ethyl (B) <5 <5 mg/dL  Urine rapid drug screen (hosp performed)  Result Value Ref Range   Opiates NONE DETECTED NONE DETECTED   Cocaine NONE DETECTED NONE DETECTED   Benzodiazepines POSITIVE (A) NONE DETECTED   Amphetamines NONE DETECTED NONE DETECTED   Tetrahydrocannabinol NONE DETECTED NONE DETECTED   Barbiturates NONE DETECTED NONE DETECTED     Imaging Review Dg Abd Acute W/chest  10/09/2015  CLINICAL DATA:  Abdominal pain and diarrhea EXAM: DG ABDOMEN ACUTE W/ 1V CHEST COMPARISON:  03/09/2015 FINDINGS: Cardiac shadow is within normal limits. The lungs are well aerated bilaterally. Biapical scarring is again seen and stable. Scattered large and small bowel gas is noted. Chronic pancreatic calcifications are noted consistent with previous pancreatitis. No obstructive changes are seen. No free air is noted. The bony structures are within normal limits. IMPRESSION: No acute abnormality noted. Electronically Signed   By: Inez Catalina M.D.   On: 10/09/2015 10:54   I have personally reviewed and evaluated these images and lab results as part of my medical decision-making.   EKG Interpretation None      MDM   Final diagnoses:  Hyponatremia  Chronic abdominal pain   Patient known to have chronic abdominal pain. But today presented with diarrhea several episodes that started yesterday. Associated with nausea but no vomiting. Workup here shows no acute abdominal process. However patient does have significant hyponatremia. And we'll require admission and hydration for that.   I personally performed the services described in this documentation, which was scribed in my presence. The recorded information has been reviewed and is accurate.      Fredia Sorrow, MD 10/09/15 1308

## 2015-10-09 NOTE — ED Notes (Signed)
When administering po Oxycodone IR pt dropped it in his lap and swallowed water. I took the pill off of his lap and asked him why he was not taking the medication and he said I was going to take it later with my other pills. I explained to him that it was a narcotic and he must take it while I was in the room and the pt apologized.

## 2015-10-10 LAB — OSMOLALITY, URINE: Osmolality, Ur: 200 mOsm/kg — ABNORMAL LOW (ref 300–900)

## 2015-10-10 LAB — CBC
HCT: 40.2 % (ref 39.0–52.0)
HEMOGLOBIN: 14 g/dL (ref 13.0–17.0)
MCH: 34.4 pg — AB (ref 26.0–34.0)
MCHC: 34.8 g/dL (ref 30.0–36.0)
MCV: 98.8 fL (ref 78.0–100.0)
PLATELETS: 211 10*3/uL (ref 150–400)
RBC: 4.07 MIL/uL — AB (ref 4.22–5.81)
RDW: 12.6 % (ref 11.5–15.5)
WBC: 5.4 10*3/uL (ref 4.0–10.5)

## 2015-10-10 LAB — BASIC METABOLIC PANEL
ANION GAP: 8 (ref 5–15)
BUN: 5 mg/dL — ABNORMAL LOW (ref 6–20)
CHLORIDE: 97 mmol/L — AB (ref 101–111)
CO2: 22 mmol/L (ref 22–32)
CREATININE: 0.44 mg/dL — AB (ref 0.61–1.24)
Calcium: 8.4 mg/dL — ABNORMAL LOW (ref 8.9–10.3)
GFR calc non Af Amer: >60 mL/min (ref >60)
Glucose, Bld: 209 mg/dL — ABNORMAL HIGH (ref 65–99)
Potassium: 3.4 mmol/L — ABNORMAL LOW (ref 3.5–5.1)
Sodium: 127 mmol/L — ABNORMAL LOW (ref 135–145)

## 2015-10-10 LAB — SODIUM, URINE, RANDOM: SODIUM UR: 57 mmol/L

## 2015-10-10 MED ORDER — NICOTINE 21 MG/24HR TD PT24
21.0000 mg | MEDICATED_PATCH | Freq: Every day | TRANSDERMAL | Status: DC
  Administered 2015-10-10 – 2015-10-11 (×2): 21 mg via TRANSDERMAL
  Filled 2015-10-10 (×2): qty 1

## 2015-10-10 NOTE — Progress Notes (Signed)
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 rate 16, height 5' 9"  (1.753 [name]), weight 130 lb 14.4 oz (59.376 kg), SpO2 98 %. lungs show prolonged his return x-ray phase scattered coarse rhonchi bilaterally mild end expiratory wheezes noted heart regular rhythm no S3-S4 no heaves thrills rubs abdomen soft nontender bowel sounds normoactive   Investigations:  Recent Results (from the past 240 hour(s))  C difficile quick scan w PCR reflex     Status: None   Collection Time: 10/09/15 10:31 AM  Result Value Ref Range Status   C Diff antigen NEGATIVE NEGATIVE Final   C Diff toxin NEGATIVE NEGATIVE Final   C Diff interpretation Negative for toxigenic C. difficile  Final     Basic Metabolic Panel:  Recent Labs  10/09/15 0910 10/10/15 0614  NA 121* 127*  K 4.8 3.4*  CL 84* 97*  CO2 24 22  GLUCOSE 120* 209*  BUN 6 5*  CREATININE 0.45* 0.44*  CALCIUM 9.3 8.4*   Liver Function Tests:  Recent Labs  10/09/15 0910  AST 33  ALT 19  ALKPHOS 119  BILITOT 1.1  PROT 9.0*  ALBUMIN 4.8     CBC:  Recent Labs  10/09/15 0910 10/10/15 0614  WBC 12.0* 5.4  NEUTROABS 7.4  --   HGB 16.7 14.0  HCT 46.0 40.2  MCV 96.6 98.8  PLT 240 211    Dg Abd Acute W/chest  10/09/2015  CLINICAL DATA:  Abdominal pain and diarrhea EXAM: DG ABDOMEN ACUTE W/ 1V CHEST COMPARISON:  03/09/2015 FINDINGS: Cardiac shadow is within normal limits. The lungs are well aerated bilaterally. Biapical scarring is again seen and stable. Scattered large and small bowel gas is noted. Chronic pancreatic  calcifications are noted consistent with previous pancreatitis. No obstructive changes are seen. No free air is noted. The bony structures are within normal limits. IMPRESSION: No acute abnormality noted. Electronically Signed   By: Inez Catalina [name].D.   On: 10/09/2015 10:54      Medications:   Impression:  Principal Problem:   Hyponatremia Active Problems:   Alcohol abuse, continuous   Tobacco abuse   Seizure disorder (HCC)   COPD (chronic obstructive pulmonary disease) (HCC)   Diarrhea     Plan: Monitor be met daily continue lorazepam for ethanol withdrawal precautions patient given thiamine will add nicotine patch for withdrawal issues  Consultants:    Procedures   Antibiotics:                   Code Status: Full  Family Communication:    Disposition Plan see plan above  Time spent: 30 minutes   LOS: 1 day   [name]   10/10/2015, 1:58 PM

## 2015-10-11 LAB — BASIC METABOLIC PANEL
Anion gap: 7 (ref 5–15)
BUN: 5 mg/dL — AB (ref 6–20)
CALCIUM: 8.7 mg/dL — AB (ref 8.9–10.3)
CO2: 24 mmol/L (ref 22–32)
CREATININE: 0.39 mg/dL — AB (ref 0.61–1.24)
Chloride: 101 mmol/L (ref 101–111)
GFR calc Af Amer: >60 mL/min (ref >60)
GLUCOSE: 137 mg/dL — AB (ref 65–99)
Potassium: 3.4 mmol/L — ABNORMAL LOW (ref 3.5–5.1)
Sodium: 132 mmol/L — ABNORMAL LOW (ref 135–145)

## 2015-10-11 NOTE — Progress Notes (Signed)
[facility] discharged Home per MD order.  Discharge instructions reviewed and discussed with the patient, all questions and concerns answered. Copy of instructions given to patient.    Medication List    STOP taking these medications        ondansetron 4 MG disintegrating tablet  Commonly known as:  ZOFRAN ODT      TAKE these medications        albuterol 108 (90 BASE) MCG/ACT inhaler  Commonly known as:  PROVENTIL HFA;VENTOLIN HFA  Inhale 2 puffs into the lungs every 6 (six) hours as needed for wheezing or shortness of breath.     albuterol (2.5 MG/3ML) 0.083% nebulizer solution  Commonly known as:  PROVENTIL  Take 3 mLs (2.5 mg total) by nebulization every 4 (four) hours as needed for wheezing or shortness of breath.     carbamazepine 200 MG tablet  Commonly known as:  TEGRETOL  Take 400 mg by mouth 2 (two) times daily.     Fluticasone-Salmeterol 250-50 MCG/DOSE Aepb  Commonly known as:  ADVAIR  Inhale 1 puff into the lungs 2 (two) times daily.     levETIRAcetam 500 MG tablet  Commonly known as:  KEPPRA  Take 500 mg by mouth 2 (two) times daily.     omeprazole 20 MG capsule  Commonly known as:  PRILOSEC  Take 1 po BID x 2 weeks then once a day     oxyCODONE-acetaminophen 10-325 MG tablet  Commonly known as:  PERCOCET  Take 1 tablet by mouth every 6 (six) hours as needed for pain.        Patients skin is clean, dry and intact, no evidence of skin break down. IV site discontinued and catheter remains intact. Site without signs and symptoms of complications. Dressing and pressure applied.  Patient refused to leave hospital via wheelchair. Pt walked to hospital entrance. No distress noted upon discharge.  Polly Cobia 10/11/2015 2:55 PM

## 2015-10-11 NOTE — Care Management Note (Signed)
Case Management Note  Patient Details  Name: [name] MRN: [id number] Date of Birth: [birth year]  Subjective/Objective:                  Pt is from home, lives alone and is ind with ADL's. Pt has no DME or HH services prior to admission.   Action/Plan: Pt discharging home today. No CM needs.   Expected Discharge Date:       10/11/2015           Expected Discharge Plan:  Home/Self Care  In-House Referral:  NA  Discharge planning Services  CM Consult  Post Acute Care Choice:  NA Choice offered to:  NA  DME Arranged:    DME Agency:     HH Arranged:    HH Agency:     Status of Service:  Completed, signed off  Medicare Important Message Given:    Date Medicare IM Given:    Medicare IM give by:    Date Additional Medicare IM Given:    Additional Medicare Important Message give by:     If discussed at [place] of Stay Meetings, dates discussed:    Additional Comments:  Sherald Barge, RN 10/11/2015, 2:45 PM

## 2015-10-11 NOTE — Discharge Summary (Signed)
Physician Discharge Summary  [name] [id number] DOB: 02-03-1976 DOA: 10/09/2015  PCP: Maricela Curet, MD  Admit date: 10/09/2015 Discharge date: 10/11/2015   Recommendations for Outpatient Follow-up:  Patient is advised to cease all alcohol ingestion to attend AA meetings and to cease all cigarette smoking he is advised to follow-up my office in 4 days time to assess electrolytes sodium level in the therapeutic levels of antiseizure medicines Discharge Diagnoses:  Principal Problem:   Hyponatremia Active Problems:   Alcohol abuse, continuous   Tobacco abuse   Seizure disorder (HCC)   COPD (chronic obstructive pulmonary disease) ([place])   Diarrhea   Discharge Condition: Good  Filed Weights   10/09/15 0853 10/09/15 1519  Weight: 140 lb (63.504 kg) 130 lb 14.4 oz (59.376 kg)    History of present illness:  Patient middle-age white male with severe ethanol abuse and severe bullous emphysema with tobacco abuse who is refractory to treatment for either multiple times been tried on Chantix as well as a day programs he comes in with some altered mental status despite imbibing alcohol with a sodium of 121 and this was corrected intravenously with 0.9 normal saline and his sodium corrected over 48 hour. He was put on Ativan for ethanol withdrawal protocol as well as nicotine patch and he seems to do well and was alert and oriented pacing the hallways safely discharged on the second to third hospital day always aforementioned medicines with the advice to attend AA meetings and to have cigarette cessation  Hospital Course:  See history of present illness  Procedures:    Consultations:    Discharge Instructions  Discharge Instructions    Discharge instructions    Complete by:  As directed      Discharge instructions    Complete by:  As directed             Medication List    STOP taking these medications        ondansetron 4 MG disintegrating tablet    Commonly known as:  ZOFRAN ODT      TAKE these medications        albuterol 108 (90 BASE) MCG/ACT inhaler  Commonly known as:  PROVENTIL HFA;VENTOLIN HFA  Inhale 2 puffs into the lungs every 6 (six) hours as needed for wheezing or shortness of breath.     albuterol (2.5 MG/3ML) 0.083% nebulizer solution  Commonly known as:  PROVENTIL  Take 3 mLs (2.5 mg total) by nebulization every 4 (four) hours as needed for wheezing or shortness of breath.     carbamazepine 200 MG tablet  Commonly known as:  TEGRETOL  Take 400 mg by mouth 2 (two) times daily.     Fluticasone-Salmeterol 250-50 MCG/DOSE Aepb  Commonly known as:  ADVAIR  Inhale 1 puff into the lungs 2 (two) times daily.     levETIRAcetam 500 MG tablet  Commonly known as:  KEPPRA  Take 500 mg by mouth 2 (two) times daily.     omeprazole 20 MG capsule  Commonly known as:  PRILOSEC  Take 1 po BID x 2 weeks then once a day     oxyCODONE-acetaminophen 10-325 MG tablet  Commonly known as:  PERCOCET  Take 1 tablet by mouth every 6 (six) hours as needed for pain.       No Known Allergies    The results of significant diagnostics from this hospitalization (including imaging, microbiology, ancillary and laboratory) are listed below for reference.  Significant Diagnostic Studies: Dg Abd Acute W/chest  10/09/2015  CLINICAL DATA:  Abdominal pain and diarrhea EXAM: DG ABDOMEN ACUTE W/ 1V CHEST COMPARISON:  03/09/2015 FINDINGS: Cardiac shadow is within normal limits. The lungs are well aerated bilaterally. Biapical scarring is again seen and stable. Scattered large and small bowel gas is noted. Chronic pancreatic calcifications are noted consistent with previous pancreatitis. No obstructive changes are seen. No free air is noted. The bony structures are within normal limits. IMPRESSION: No acute abnormality noted. Electronically Signed   By: Inez Catalina M.D.   On: 10/09/2015 10:54    Microbiology: Recent Results (from the past  240 hour(s))  C difficile quick scan w PCR reflex     Status: None   Collection Time: 10/09/15 10:31 AM  Result Value Ref Range Status   C Diff antigen NEGATIVE NEGATIVE Final   C Diff toxin NEGATIVE NEGATIVE Final   C Diff interpretation Negative for toxigenic C. difficile  Final     Labs: Basic Metabolic Panel:  Recent Labs Lab 10/09/15 0910 10/10/15 0614 10/11/15 0636  NA 121* 127* 132*  K 4.8 3.4* 3.4*  CL 84* 97* 101  CO2 24 22 24   GLUCOSE 120* 209* 137*  BUN 6 5* 5*  CREATININE 0.45* 0.44* 0.39*  CALCIUM 9.3 8.4* 8.7*   Liver Function Tests:  Recent Labs Lab 10/09/15 0910  AST 33  ALT 19  ALKPHOS 119  BILITOT 1.1  PROT 9.0*  ALBUMIN 4.8    Recent Labs Lab 10/09/15 0910  LIPASE 26   No results for input(s): AMMONIA in the last 168 hours. CBC:  Recent Labs Lab 10/09/15 0910 10/10/15 0614  WBC 12.0* 5.4  NEUTROABS 7.4  --   HGB 16.7 14.0  HCT 46.0 40.2  MCV 96.6 98.8  PLT 240 211   Cardiac Enzymes: No results for input(s): CKTOTAL, CKMB, CKMBINDEX, TROPONINI in the last 168 hours. BNP: BNP (last 3 results) No results for input(s): BNP in the last 8760 hours.  ProBNP (last 3 results) No results for input(s): PROBNP in the last 8760 hours.  CBG: No results for input(s): GLUCAP in the last 168 hours.     Signed:  Erek Kowal Jerilynn Mages  [facility] Pager: [phone] 10/11/2015, 2:27 PM

## 2015-12-11 ENCOUNTER — Encounter (HOSPITAL_COMMUNITY): Payer: Self-pay | Admitting: Emergency Medicine

## 2015-12-11 ENCOUNTER — Emergency Department (HOSPITAL_COMMUNITY)
Admission: EM | Admit: 2015-12-11 | Discharge: 2015-12-11 | Disposition: A | Discharge status: Home / Self Care | Payer: Medicaid Other | Attending: Emergency Medicine | Admitting: Emergency Medicine

## 2015-12-11 DIAGNOSIS — R1084 Generalized abdominal pain: Secondary | ICD-10-CM | POA: Diagnosis present

## 2015-12-11 DIAGNOSIS — F17210 Nicotine dependence, cigarettes, uncomplicated: Secondary | ICD-10-CM | POA: Insufficient documentation

## 2015-12-11 NOTE — ED Notes (Signed)
Patient c/o generalized abd pain. Denies any nausea, vomiting, diarrhea or fevers. Per patient last BM yesterday, denies any blood noted.

## 2015-12-11 NOTE — ED Notes (Signed)
Called for room assignment, unable to locate patient

## 2015-12-12 ENCOUNTER — Emergency Department (HOSPITAL_COMMUNITY)
Admission: EM | Admit: 2015-12-12 | Discharge: 2015-12-13 | Disposition: A | Discharge status: Home / Self Care | Payer: Medicaid Other | Attending: Emergency Medicine | Admitting: Emergency Medicine

## 2015-12-12 ENCOUNTER — Encounter (HOSPITAL_COMMUNITY): Payer: Self-pay | Admitting: *Deleted

## 2015-12-12 DIAGNOSIS — F17210 Nicotine dependence, cigarettes, uncomplicated: Secondary | ICD-10-CM | POA: Diagnosis not present

## 2015-12-12 DIAGNOSIS — Z8701 Personal history of pneumonia (recurrent): Secondary | ICD-10-CM | POA: Diagnosis not present

## 2015-12-12 DIAGNOSIS — J449 Chronic obstructive pulmonary disease, unspecified: Secondary | ICD-10-CM | POA: Insufficient documentation

## 2015-12-12 DIAGNOSIS — Z7951 Long term (current) use of inhaled steroids: Secondary | ICD-10-CM | POA: Diagnosis not present

## 2015-12-12 DIAGNOSIS — K860 Alcohol-induced chronic pancreatitis: Secondary | ICD-10-CM | POA: Insufficient documentation

## 2015-12-12 DIAGNOSIS — Z79899 Other long term (current) drug therapy: Secondary | ICD-10-CM | POA: Diagnosis not present

## 2015-12-12 DIAGNOSIS — R1013 Epigastric pain: Secondary | ICD-10-CM | POA: Diagnosis present

## 2015-12-12 DIAGNOSIS — Z8679 Personal history of other diseases of the circulatory system: Secondary | ICD-10-CM | POA: Diagnosis not present

## 2015-12-12 DIAGNOSIS — Z862 Personal history of diseases of the blood and blood-forming organs and certain disorders involving the immune mechanism: Secondary | ICD-10-CM | POA: Insufficient documentation

## 2015-12-12 LAB — LIPASE, BLOOD: LIPASE: 119 U/L — AB (ref 11–51)

## 2015-12-12 LAB — COMPREHENSIVE METABOLIC PANEL
ALBUMIN: 4.4 g/dL (ref 3.5–5.0)
ALT: 14 U/L — ABNORMAL LOW (ref 17–63)
ANION GAP: 11 (ref 5–15)
AST: 26 U/L (ref 15–41)
Alkaline Phosphatase: 110 U/L (ref 38–126)
BUN: 6 mg/dL (ref 6–20)
CHLORIDE: 92 mmol/L — AB (ref 101–111)
CO2: 27 mmol/L (ref 22–32)
Calcium: 9.4 mg/dL (ref 8.9–10.3)
Creatinine, Ser: 0.42 mg/dL — ABNORMAL LOW (ref 0.61–1.24)
GFR calc non Af Amer: >60 mL/min (ref >60)
GLUCOSE: 96 mg/dL (ref 65–99)
POTASSIUM: 4.6 mmol/L (ref 3.5–5.1)
SODIUM: 130 mmol/L — AB (ref 135–145)
Total Bilirubin: 0.6 mg/dL (ref 0.3–1.2)
Total Protein: 8.4 g/dL — ABNORMAL HIGH (ref 6.5–8.1)

## 2015-12-12 LAB — URINALYSIS, ROUTINE W REFLEX MICROSCOPIC
Bilirubin Urine: NEGATIVE
GLUCOSE, UA: NEGATIVE mg/dL
Hgb urine dipstick: NEGATIVE
KETONES UR: NEGATIVE mg/dL
LEUKOCYTES UA: NEGATIVE
NITRITE: NEGATIVE
PH: 6 (ref 5.0–8.0)
Protein, ur: NEGATIVE mg/dL
SPECIFIC GRAVITY, URINE: 1.01 (ref 1.005–1.030)

## 2015-12-12 LAB — CBC WITH DIFFERENTIAL/PLATELET
BASOS PCT: 0 %
Basophils Absolute: 0 10*3/uL (ref 0.0–0.1)
Eosinophils Absolute: 0.4 10*3/uL (ref 0.0–0.7)
Eosinophils Relative: 4 %
HEMATOCRIT: 38.2 % — AB (ref 39.0–52.0)
HEMOGLOBIN: 12.8 g/dL — AB (ref 13.0–17.0)
LYMPHS ABS: 1.9 10*3/uL (ref 0.7–4.0)
LYMPHS PCT: 18 %
MCH: 33.6 pg (ref 26.0–34.0)
MCHC: 33.5 g/dL (ref 30.0–36.0)
MCV: 100.3 fL — AB (ref 78.0–100.0)
MONO ABS: 1.3 10*3/uL — AB (ref 0.1–1.0)
MONOS PCT: 12 %
NEUTROS ABS: 7.3 10*3/uL (ref 1.7–7.7)
NEUTROS PCT: 66 %
Platelets: 243 10*3/uL (ref 150–400)
RBC: 3.81 MIL/uL — ABNORMAL LOW (ref 4.22–5.81)
RDW: 13.1 % (ref 11.5–15.5)
WBC: 10.9 10*3/uL — ABNORMAL HIGH (ref 4.0–10.5)

## 2015-12-12 NOTE — ED Provider Notes (Signed)
By signing my name below, I, Jolayne Panther, attest that this documentation has been prepared under the direction and in the presence of [place], DO. Electronically Signed: Jolayne Panther, Scribe. 12/12/2015. 12:33 AM.  TIME SEEN: 12:28 AM  CHIEF COMPLAINT: abdominal pain  HPI: HPI Comments: [name] is a 40 y.o. male with a past hx of seizures, heavy alcohol use, and pancreatitis, who presents to the Emergency Department complaining of sudden onset, moderate, epigastric and periumbilical abdominal pain which began four days ago and feels similar to his pancreatitis. Pt also reports associated burning chest pain. He states that he last drank alcohol yesterday and notes that he drinks about once a week now. Pt takes 200 mg of tegretol and 500 mg of keppra every night and every morning. He denies blood in his stool, black, tarry stool, SOB, fever, cough, diarrhea, nausea and vomiting. He also denies hx of abdominal surgery.   ROS: See HPI Constitutional: no fever  Eyes: no drainage  ENT: no runny nose   Cardiovascular:  no chest pain  Resp: no SOB  GI: no vomiting GU: no dysuria Integumentary: no rash  Allergy: no hives  Musculoskeletal: no leg swelling  Neurological: no slurred speech ROS otherwise negative  PAST MEDICAL HISTORY/PAST SURGICAL HISTORY:  Past Medical History  Diagnosis Date  . Pancreatitis   . Seizures ([place])   . Back pain   . ETOH abuse   . COPD (chronic obstructive pulmonary disease) ([place])   . SAH (subarachnoid hemorrhage) ([place])   . Macrocytosis 08/17/2011  . Bradycardia 08/17/2011  . Pneumonia   . GERD (gastroesophageal reflux disease)     MEDICATIONS:  Prior to Admission medications   Medication Sig Start Date End Date Taking? Authorizing Provider  albuterol (PROVENTIL HFA;VENTOLIN HFA) 108 (90 BASE) MCG/ACT inhaler Inhale 2 puffs into the lungs every 6 (six) hours as needed for wheezing or shortness of breath.    Historical  Provider, MD  albuterol (PROVENTIL) (2.5 MG/3ML) 0.083% nebulizer solution Take 3 mLs (2.5 mg total) by nebulization every 4 (four) hours as needed for wheezing or shortness of breath. 09/09/13   Orpah Greek, MD  carbamazepine (TEGRETOL) 200 MG tablet Take 400 mg by mouth 2 (two) times daily.    Historical Provider, MD  Fluticasone-Salmeterol (ADVAIR) 250-50 MCG/DOSE AEPB Inhale 1 puff into the lungs 2 (two) times daily.    Historical Provider, MD  levETIRAcetam (KEPPRA) 500 MG tablet Take 500 mg by mouth 2 (two) times daily.     Historical Provider, MD  omeprazole (PRILOSEC) 20 MG capsule Take 1 po BID x 2 weeks then once a day 07/18/15   Rolland Porter, MD  oxyCODONE-acetaminophen (PERCOCET) 10-325 MG per tablet Take 1 tablet by mouth every 6 (six) hours as needed for pain.     Historical Provider, MD    ALLERGIES:  No Known Allergies  SOCIAL HISTORY:  Social History  Substance Use Topics  . Smoking status: Current Every Day Smoker -- 1.00 packs/day for 20 years    Types: Cigarettes  . Smokeless tobacco: Never Used  . Alcohol Use: 25.2 oz/week    42 Cans of beer per week     Comment: daily, at least 18 beers a day     FAMILY HISTORY: Family History  Problem Relation Age of Onset  . Seizures Father   . Alcohol abuse Father   . Coronary artery disease Mother     deceased age 50  . Colon cancer  Neg Hx     EXAM: BP 115/75 mmHg  Pulse 78  Temp(Src) 98.7 F (37.1 C) (Oral)  Resp 20  Ht 5\' 9"  (1.753 m)  Wt 125 lb (56.7 kg)  BMI 18.45 kg/m2  SpO2 96% CONSTITUTIONAL: Alert and oriented and responds appropriately to questions. Chronically ill-appearing; well-nourished, afebrile but appears uncomfortable  HEAD: Normocephalic EYES: Conjunctivae clear, PERRL ENT: normal nose; no rhinorrhea; moist mucous membranes; pharynx without lesions noted NECK: Supple, no meningismus, no LAD  CARD: RRR; S1 and S2 appreciated; no murmurs, no clicks, no rubs, no gallops RESP: Normal chest  excursion without splinting or tachypnea; breath sounds clear and equal bilaterally; no wheezes, no rhonchi, no rales, no hypoxia or respiratory distress, speaking full sentences ABD/GI: Normal bowel sounds; non-distended; soft, diffuse tenderness to palpation with voluntary guarding, no rebound, no peritoneal signs BACK:  The back appears normal and is non-tender to palpation, there is no CVA tenderness EXT: Normal ROM in all joints; non-tender to palpation; no edema; normal capillary refill; no cyanosis, no calf tenderness or swelling    SKIN: Normal color for age and race; warm NEURO: Moves all extremities equally, sensation to light touch intact diffusely, cranial nerves II through XII intact PSYCH: The patient's mood and manner are appropriate. Grooming and personal hygiene are appropriate.  MEDICAL DECISION MAKING: Patient here with complaints of abdominal pain similar to his prior episodes of acute pancreatitis. Have advised him to stop drinking alcohol. He does not appear intoxicated in the ED. He is hemodynamically stable. No vomiting or diarrhea. Previous CT scans have shown acute pancreatitis without complications. He has a nonsurgical abdomen today. We'll give IV fluids and treat patient's pain. Labs show elevated lipase of 119. Given he is having chest pain although atypical will obtain EKG and troponin. We'll also give him a dose of his nighttime seizure medications in the emergency department.  ED PROGRESS: Patient's troponin negative. EKG shows no ischemic changes. He reports feeling better. He is able to drink without vomiting. I feel he is safe to be discharged. We'll discharge patient with short course of Dilaudid po for pain as well as Zofran for nausea. Have advised him to avoid alcohol, drink fluids and follow-up with his primary care physician. Discussed return precautions. He verbalizes understanding and is comfortable with this plan.     EKG  Interpretation  Date/Time:  Tuesday December 13 2015 00:54:25 EST Ventricular Rate:  81 PR Interval:  183 QRS Duration: 103 QT Interval:  418 QTC Calculation: 485 R Axis:   83 Text Interpretation:  Sinus rhythm Probable left atrial enlargement Borderline prolonged QT interval No significant change since last tracing Confirmed by Mishell Donalson,  DO, Anil Havard [id number]) on 12/13/2015 1:45:21 AM        I personally performed the services described in this documentation, which was scribed in my presence. The recorded information has been reviewed and is accurate.      [place], DO 12/13/15 [phone]

## 2015-12-12 NOTE — ED Notes (Signed)
Pt comes in with abdominal pain starting 4 days ago. Pt has hx of pancreatitis and states this feels the same. Pt denies v/d.

## 2015-12-13 LAB — TROPONIN I: Troponin I: <0.03 ng/mL (ref <0.031)

## 2015-12-13 MED ORDER — HYDROMORPHONE HCL 2 MG PO TABS
2.0000 mg | ORAL_TABLET | Freq: Once | ORAL | Status: AC
  Administered 2015-12-13: 2 mg via ORAL
  Filled 2015-12-13: qty 1

## 2015-12-13 MED ORDER — SODIUM CHLORIDE 0.9 % IV BOLUS (SEPSIS)
1000.0000 mL | Freq: Once | INTRAVENOUS | Status: AC
  Administered 2015-12-13: 1000 mL via INTRAVENOUS

## 2015-12-13 MED ORDER — LEVETIRACETAM 500 MG PO TABS
500.0000 mg | ORAL_TABLET | Freq: Once | ORAL | Status: AC
  Administered 2015-12-13: 500 mg via ORAL
  Filled 2015-12-13: qty 1

## 2015-12-13 MED ORDER — CARBAMAZEPINE 200 MG PO TABS
200.0000 mg | ORAL_TABLET | Freq: Once | ORAL | Status: AC
  Administered 2015-12-13: 200 mg via ORAL
  Filled 2015-12-13: qty 1

## 2015-12-13 MED ORDER — HYDROMORPHONE HCL 2 MG/ML IJ SOLN
2.0000 mg | Freq: Once | INTRAMUSCULAR | Status: AC
  Administered 2015-12-13: 2 mg via INTRAVENOUS
  Filled 2015-12-13: qty 1

## 2015-12-13 MED ORDER — HYDROMORPHONE HCL 2 MG PO TABS: 2.0000 mg | ORAL_TABLET | ORAL | Status: DC | PRN

## 2015-12-13 MED ORDER — HYDROMORPHONE HCL 1 MG/ML IJ SOLN: 1.0000 mg | Freq: Once | INTRAMUSCULAR | Status: DC

## 2015-12-13 MED ORDER — ONDANSETRON 4 MG PO TBDP: 4.0000 mg | ORAL_TABLET | Freq: Three times a day (TID) | ORAL | Status: DC | PRN

## 2015-12-13 MED ORDER — ONDANSETRON HCL 4 MG/2ML IJ SOLN
4.0000 mg | Freq: Once | INTRAMUSCULAR | Status: AC
  Administered 2015-12-13: 4 mg via INTRAVENOUS
  Filled 2015-12-13: qty 2

## 2015-12-13 NOTE — Discharge Instructions (Signed)
Acute Pancreatitis Acute pancreatitis is a disease in which the pancreas becomes suddenly inflamed. The pancreas is a large gland located behind your stomach. The pancreas produces enzymes that help digest food. The pancreas also releases the hormones glucagon and insulin that help regulate blood sugar. Damage to the pancreas occurs when the digestive enzymes from the pancreas are activated and begin attacking the pancreas before being released into the intestine. Most acute attacks last a couple of days and can cause serious complications. Some people become dehydrated and develop low blood pressure. In severe cases, bleeding into the pancreas can lead to shock and can be life-threatening. The lungs, heart, and kidneys may fail. CAUSES  Pancreatitis can happen to anyone. In some cases, the cause is unknown. Most cases are caused by:  Alcohol abuse.  Gallstones. Other less common causes are:  Certain medicines.  Exposure to certain chemicals.  Infection.  Damage caused by an accident (trauma).  Abdominal surgery. SYMPTOMS   Pain in the upper abdomen that may radiate to the back.  Tenderness and swelling of the abdomen.  Nausea and vomiting. DIAGNOSIS  Your caregiver will perform a physical exam. Blood and stool tests may be done to confirm the diagnosis. Imaging tests may also be done, such as X-rays, CT scans, or an ultrasound of the abdomen. TREATMENT  Treatment usually requires a stay in the hospital. Treatment may include:  Pain medicine.  Fluid replacement through an intravenous line (IV).  Placing a tube in the stomach to remove stomach contents and control vomiting.  Not eating for 3 or 4 days. This gives your pancreas a rest, because enzymes are not being produced that can cause further damage.  Antibiotic medicines if your condition is caused by an infection.  Surgery of the pancreas or gallbladder. HOME CARE INSTRUCTIONS   Follow the diet advised by your  caregiver. This may involve avoiding alcohol and decreasing the amount of fat in your diet.  Eat smaller, more frequent meals. This reduces the amount of digestive juices the pancreas produces.  Drink enough fluids to keep your urine clear or pale yellow.  Only take over-the-counter or prescription medicines as directed by your caregiver.  Avoid drinking alcohol if it caused your condition.  Do not smoke.  Get plenty of rest.  Check your blood sugar at home as directed by your caregiver.  Keep all follow-up appointments as directed by your caregiver. SEEK MEDICAL CARE IF:   You do not recover as quickly as expected.  You develop new or worsening symptoms.  You have persistent pain, weakness, or nausea.  You recover and then have another episode of pain. SEEK IMMEDIATE MEDICAL CARE IF:   You are unable to eat or keep fluids down.  Your pain becomes severe.  You have a fever or persistent symptoms for more than 2 to 3 days.  You have a fever and your symptoms suddenly get worse.  Your skin or the white part of your eyes turn yellow (jaundice).  You develop vomiting.  You feel dizzy, or you faint.  Your blood sugar is high (over 300 mg/dL). MAKE SURE YOU:   Understand these instructions.  Will watch your condition.  Will get help right away if you are not doing well or get worse.   This information is not intended to replace advice given to you by your health care provider. Make sure you discuss any questions you have with your health care provider.   Document Released: 10/15/2005 Document Revised: 04/15/2012  Document Reviewed: 01/24/2012 Elsevier Interactive Patient Education 2016 [place] Diet for Pancreatitis or Gallbladder Conditions A low-fat diet can be helpful if you have pancreatitis or a gallbladder condition. With these conditions, your pancreas and gallbladder have trouble digesting fats. A healthy eating plan with less fat will help  rest your pancreas and gallbladder and reduce your symptoms. WHAT DO I NEED TO KNOW ABOUT THIS DIET?  Eat a low-fat diet.  Reduce your fat intake to less than 20-30% of your total daily calories. This is less than 50-60 g of fat per day.  Remember that you need some fat in your diet. Ask your dietician what your daily goal should be.  Choose nonfat and low-fat healthy foods. Look for the words "nonfat," "low fat," or "fat free."  As a guide, look on the label and choose foods with less than 3 g of fat per serving. Eat only one serving.  Avoid alcohol.  Do not smoke. If you need help quitting, talk with your health care provider.  Eat small frequent meals instead of three large heavy meals. WHAT FOODS CAN I EAT? Grains Include healthy grains and starches such as potatoes, wheat bread, fiber-rich cereal, and brown rice. Choose whole grain options whenever possible. In adults, whole grains should account for 45-65% of your daily calories.  Fruits and Vegetables Eat plenty of fruits and vegetables. Fresh fruits and vegetables add fiber to your diet. Meats and Other Protein Sources Eat lean meat such as chicken and pork. Trim any fat off of meat before cooking it. Eggs, fish, and beans are other sources of protein. In adults, these foods should account for 10-35% of your daily calories. Dairy Choose low-fat milk and dairy options. Dairy includes fat and protein, as well as calcium.  Fats and Oils Limit high-fat foods such as fried foods, sweets, baked goods, sugary drinks.  Other Creamy sauces and condiments, such as mayonnaise, can add extra fat. Think about whether or not you need to use them, or use smaller amounts or low fat options. WHAT FOODS ARE NOT RECOMMENDED?  High fat foods, such as:  [employer].  Ice cream.  [place] toast.  Sweet rolls.  Pizza.  Cheese bread.  Foods covered with batter, butter, creamy sauces, or cheese.  Fried foods.  Sugary drinks and  desserts.  Foods that cause gas or bloating   This information is not intended to replace advice given to you by your health care provider. Make sure you discuss any questions you have with your health care provider.   Document Released: 10/20/2013 Document Reviewed: 10/20/2013 Elsevier Interactive Patient Education [employer].

## 2016-05-28 ENCOUNTER — Inpatient Hospital Stay (HOSPITAL_COMMUNITY)
Admission: EM | Admit: 2016-05-28 | Discharge: 2016-05-30 | DRG: 440 | Disposition: A | Discharge status: Home / Self Care | Payer: Medicaid Other | Attending: Family Medicine | Admitting: Family Medicine

## 2016-05-28 ENCOUNTER — Encounter (HOSPITAL_COMMUNITY): Payer: Self-pay | Admitting: *Deleted

## 2016-05-28 DIAGNOSIS — Z8249 Family history of ischemic heart disease and other diseases of the circulatory system: Secondary | ICD-10-CM

## 2016-05-28 DIAGNOSIS — K219 Gastro-esophageal reflux disease without esophagitis: Secondary | ICD-10-CM | POA: Diagnosis present

## 2016-05-28 DIAGNOSIS — J449 Chronic obstructive pulmonary disease, unspecified: Secondary | ICD-10-CM | POA: Diagnosis present

## 2016-05-28 DIAGNOSIS — F17210 Nicotine dependence, cigarettes, uncomplicated: Secondary | ICD-10-CM | POA: Diagnosis present

## 2016-05-28 DIAGNOSIS — F10230 Alcohol dependence with withdrawal, uncomplicated: Secondary | ICD-10-CM

## 2016-05-28 DIAGNOSIS — G40909 Epilepsy, unspecified, not intractable, without status epilepticus: Secondary | ICD-10-CM | POA: Diagnosis present

## 2016-05-28 DIAGNOSIS — Z7951 Long term (current) use of inhaled steroids: Secondary | ICD-10-CM

## 2016-05-28 DIAGNOSIS — R1013 Epigastric pain: Secondary | ICD-10-CM | POA: Diagnosis not present

## 2016-05-28 DIAGNOSIS — Z79899 Other long term (current) drug therapy: Secondary | ICD-10-CM | POA: Diagnosis not present

## 2016-05-28 DIAGNOSIS — Z720 Tobacco use: Secondary | ICD-10-CM | POA: Diagnosis present

## 2016-05-28 DIAGNOSIS — Z79891 Long term (current) use of opiate analgesic: Secondary | ICD-10-CM | POA: Diagnosis not present

## 2016-05-28 DIAGNOSIS — K852 Alcohol induced acute pancreatitis without necrosis or infection: Secondary | ICD-10-CM | POA: Diagnosis present

## 2016-05-28 DIAGNOSIS — J410 Simple chronic bronchitis: Secondary | ICD-10-CM

## 2016-05-28 DIAGNOSIS — F10930 Alcohol use, unspecified with withdrawal, uncomplicated: Secondary | ICD-10-CM

## 2016-05-28 DIAGNOSIS — F1010 Alcohol abuse, uncomplicated: Secondary | ICD-10-CM | POA: Diagnosis present

## 2016-05-28 DIAGNOSIS — K859 Acute pancreatitis, unspecified: Secondary | ICD-10-CM

## 2016-05-28 DIAGNOSIS — Z811 Family history of alcohol abuse and dependence: Secondary | ICD-10-CM | POA: Diagnosis not present

## 2016-05-28 DIAGNOSIS — D7589 Other specified diseases of blood and blood-forming organs: Secondary | ICD-10-CM

## 2016-05-28 LAB — CBC WITH DIFFERENTIAL/PLATELET
BASOS PCT: 0 %
Basophils Absolute: 0 10*3/uL (ref 0.0–0.1)
EOS ABS: 0.2 10*3/uL (ref 0.0–0.7)
Eosinophils Relative: 3 %
HEMATOCRIT: 40.9 % (ref 39.0–52.0)
Hemoglobin: 14 g/dL (ref 13.0–17.0)
Lymphocytes Relative: 23 %
Lymphs Abs: 1.8 10*3/uL (ref 0.7–4.0)
MCH: 34.1 pg — AB (ref 26.0–34.0)
MCHC: 34.2 g/dL (ref 30.0–36.0)
MCV: 99.5 fL (ref 78.0–100.0)
MONO ABS: 1.2 10*3/uL — AB (ref 0.1–1.0)
MONOS PCT: 15 %
Neutro Abs: 4.5 10*3/uL (ref 1.7–7.7)
Neutrophils Relative %: 59 %
Platelets: 186 10*3/uL (ref 150–400)
RBC: 4.11 MIL/uL — ABNORMAL LOW (ref 4.22–5.81)
RDW: 12.5 % (ref 11.5–15.5)
WBC: 7.7 10*3/uL (ref 4.0–10.5)

## 2016-05-28 LAB — COMPREHENSIVE METABOLIC PANEL
ALK PHOS: 98 U/L (ref 38–126)
ALT: 25 U/L (ref 17–63)
AST: 33 U/L (ref 15–41)
Albumin: 4 g/dL (ref 3.5–5.0)
Anion gap: 4 — ABNORMAL LOW (ref 5–15)
BILIRUBIN TOTAL: 0.3 mg/dL (ref 0.3–1.2)
BUN: 9 mg/dL (ref 6–20)
CO2: 26 mmol/L (ref 22–32)
CREATININE: 0.4 mg/dL — AB (ref 0.61–1.24)
Calcium: 8.7 mg/dL — ABNORMAL LOW (ref 8.9–10.3)
Chloride: 100 mmol/L — ABNORMAL LOW (ref 101–111)
GFR calc Af Amer: >60 mL/min (ref >60)
Glucose, Bld: 128 mg/dL — ABNORMAL HIGH (ref 65–99)
Potassium: 4.2 mmol/L (ref 3.5–5.1)
Sodium: 130 mmol/L — ABNORMAL LOW (ref 135–145)
TOTAL PROTEIN: 7.3 g/dL (ref 6.5–8.1)

## 2016-05-28 LAB — RAPID URINE DRUG SCREEN, HOSP PERFORMED
Amphetamines: NOT DETECTED
BARBITURATES: NOT DETECTED
Benzodiazepines: POSITIVE — AB
COCAINE: NOT DETECTED
Opiates: NOT DETECTED
Tetrahydrocannabinol: POSITIVE — AB

## 2016-05-28 LAB — ETHANOL

## 2016-05-28 LAB — LIPASE, BLOOD: Lipase: 313 U/L — ABNORMAL HIGH (ref 11–51)

## 2016-05-28 MED ORDER — ACETAMINOPHEN 650 MG RE SUPP: 650.0000 mg | Freq: Four times a day (QID) | RECTAL | Status: DC | PRN

## 2016-05-28 MED ORDER — ADULT MULTIVITAMIN W/MINERALS CH
1.0000 | ORAL_TABLET | Freq: Every day | ORAL | Status: DC
  Administered 2016-05-28 – 2016-05-30 (×3): 1 via ORAL
  Filled 2016-05-28 (×3): qty 1

## 2016-05-28 MED ORDER — HYDROMORPHONE HCL 1 MG/ML IJ SOLN
1.0000 mg | INTRAMUSCULAR | Status: DC | PRN
  Administered 2016-05-28 – 2016-05-29 (×8): 1 mg via INTRAVENOUS
  Filled 2016-05-28 (×8): qty 1

## 2016-05-28 MED ORDER — ENOXAPARIN SODIUM 40 MG/0.4ML ~~LOC~~ SOLN
40.0000 mg | SUBCUTANEOUS | Status: DC
  Administered 2016-05-28 – 2016-05-29 (×2): 40 mg via SUBCUTANEOUS
  Filled 2016-05-28 (×2): qty 0.4

## 2016-05-28 MED ORDER — LORAZEPAM 1 MG PO TABS: 1.0000 mg | ORAL_TABLET | Freq: Four times a day (QID) | ORAL | Status: DC | PRN

## 2016-05-28 MED ORDER — FENTANYL CITRATE (PF) 100 MCG/2ML IJ SOLN
25.0000 ug | Freq: Once | INTRAMUSCULAR | Status: AC
  Administered 2016-05-28: 25 ug via INTRAVENOUS
  Filled 2016-05-28: qty 2

## 2016-05-28 MED ORDER — PNEUMOCOCCAL VAC POLYVALENT 25 MCG/0.5ML IJ INJ
0.5000 mL | INJECTION | INTRAMUSCULAR | Status: AC
  Administered 2016-05-29: 0.5 mL via INTRAMUSCULAR
  Filled 2016-05-28: qty 0.5

## 2016-05-28 MED ORDER — FENTANYL CITRATE (PF) 100 MCG/2ML IJ SOLN
50.0000 ug | Freq: Once | INTRAMUSCULAR | Status: AC
  Administered 2016-05-28: 50 ug via INTRAVENOUS
  Filled 2016-05-28: qty 2

## 2016-05-28 MED ORDER — FAMOTIDINE IN NACL 20-0.9 MG/50ML-% IV SOLN
20.0000 mg | Freq: Once | INTRAVENOUS | Status: AC
  Administered 2016-05-28: 20 mg via INTRAVENOUS
  Filled 2016-05-28: qty 50

## 2016-05-28 MED ORDER — METOCLOPRAMIDE HCL 5 MG/ML IJ SOLN
10.0000 mg | Freq: Once | INTRAMUSCULAR | Status: AC
  Administered 2016-05-28: 10 mg via INTRAVENOUS
  Filled 2016-05-28: qty 2

## 2016-05-28 MED ORDER — ONDANSETRON HCL 4 MG PO TABS: 4.0000 mg | ORAL_TABLET | Freq: Four times a day (QID) | ORAL | Status: DC | PRN

## 2016-05-28 MED ORDER — SODIUM CHLORIDE 0.9 % IV SOLN
INTRAVENOUS | Status: DC
  Administered 2016-05-28 – 2016-05-30 (×4): via INTRAVENOUS

## 2016-05-28 MED ORDER — ALBUTEROL SULFATE (2.5 MG/3ML) 0.083% IN NEBU: 2.5000 mg | INHALATION_SOLUTION | RESPIRATORY_TRACT | Status: DC | PRN

## 2016-05-28 MED ORDER — SODIUM CHLORIDE 0.9 % IV SOLN
500.0000 mg | Freq: Two times a day (BID) | INTRAVENOUS | Status: DC
  Administered 2016-05-28 – 2016-05-29 (×2): 500 mg via INTRAVENOUS
  Filled 2016-05-28 (×5): qty 5

## 2016-05-28 MED ORDER — SODIUM CHLORIDE 0.9 % IV SOLN
INTRAVENOUS | Status: DC
  Administered 2016-05-28: 10:00:00 via INTRAVENOUS

## 2016-05-28 MED ORDER — SODIUM CHLORIDE 0.9 % IV BOLUS (SEPSIS)
1000.0000 mL | Freq: Once | INTRAVENOUS | Status: AC
  Administered 2016-05-28: 1000 mL via INTRAVENOUS

## 2016-05-28 MED ORDER — CARBAMAZEPINE 200 MG PO TABS
400.0000 mg | ORAL_TABLET | Freq: Two times a day (BID) | ORAL | Status: DC
  Administered 2016-05-28 – 2016-05-30 (×5): 400 mg via ORAL
  Filled 2016-05-28 (×5): qty 2

## 2016-05-28 MED ORDER — MOMETASONE FURO-FORMOTEROL FUM 200-5 MCG/ACT IN AERO
2.0000 | INHALATION_SPRAY | Freq: Two times a day (BID) | RESPIRATORY_TRACT | Status: DC
  Administered 2016-05-28 – 2016-05-30 (×5): 2 via RESPIRATORY_TRACT
  Filled 2016-05-28: qty 8.8

## 2016-05-28 MED ORDER — LORAZEPAM 2 MG/ML IJ SOLN
1.0000 mg | Freq: Four times a day (QID) | INTRAMUSCULAR | Status: DC | PRN
  Administered 2016-05-28: 1 mg via INTRAVENOUS

## 2016-05-28 MED ORDER — DIPHENHYDRAMINE HCL 50 MG/ML IJ SOLN
25.0000 mg | Freq: Once | INTRAMUSCULAR | Status: AC
  Administered 2016-05-28: 25 mg via INTRAVENOUS
  Filled 2016-05-28: qty 1

## 2016-05-28 MED ORDER — SODIUM CHLORIDE 0.9 % IV BOLUS (SEPSIS)
500.0000 mL | Freq: Once | INTRAVENOUS | Status: AC
  Administered 2016-05-28: 500 mL via INTRAVENOUS

## 2016-05-28 MED ORDER — FOLIC ACID 1 MG PO TABS
1.0000 mg | ORAL_TABLET | Freq: Every day | ORAL | Status: DC
  Administered 2016-05-28 – 2016-05-30 (×3): 1 mg via ORAL
  Filled 2016-05-28 (×3): qty 1

## 2016-05-28 MED ORDER — LORAZEPAM 2 MG/ML IJ SOLN
0.0000 mg | Freq: Four times a day (QID) | INTRAMUSCULAR | Status: AC
Start: 2016-05-28 — End: 2016-05-30
  Administered 2016-05-28 (×2): 1 mg via INTRAVENOUS
  Administered 2016-05-29 (×3): 2 mg via INTRAVENOUS
  Administered 2016-05-29: 1 mg via INTRAVENOUS
  Administered 2016-05-30: 2 mg via INTRAVENOUS
  Filled 2016-05-28 (×3): qty 1
  Filled 2016-05-28: qty 2
  Filled 2016-05-28 (×4): qty 1

## 2016-05-28 MED ORDER — ONDANSETRON HCL 4 MG/2ML IJ SOLN: 4.0000 mg | Freq: Four times a day (QID) | INTRAMUSCULAR | Status: DC | PRN

## 2016-05-28 MED ORDER — LORAZEPAM 2 MG/ML IJ SOLN: 0.0000 mg | Freq: Two times a day (BID) | INTRAMUSCULAR | Status: DC

## 2016-05-28 MED ORDER — VITAMIN B-1 100 MG PO TABS
100.0000 mg | ORAL_TABLET | Freq: Every day | ORAL | Status: DC
  Administered 2016-05-28: 100 mg via ORAL
  Filled 2016-05-28: qty 1

## 2016-05-28 MED ORDER — ACETAMINOPHEN 325 MG PO TABS: 650.0000 mg | ORAL_TABLET | Freq: Four times a day (QID) | ORAL | Status: DC | PRN

## 2016-05-28 MED ORDER — THIAMINE HCL 100 MG/ML IJ SOLN
100.0000 mg | Freq: Every day | INTRAMUSCULAR | Status: DC
  Administered 2016-05-29 – 2016-05-30 (×2): 100 mg via INTRAVENOUS
  Filled 2016-05-28 (×2): qty 2

## 2016-05-28 NOTE — ED Notes (Signed)
Pt asking if the fentanyl he is getting is like "[employer]"  States he usually gets 2mg  of "di la la" for his pain.  Pt also states he wants phenergan so he does not get sick.  md notified

## 2016-05-28 NOTE — ED Provider Notes (Signed)
[name] Provider Note   CSN: [id number] Arrival date & time: 05/28/16  [id number]  First Provider Contact:  First MD Initiated Contact with Patient 05/28/16 0522        History   Chief Complaint Chief Complaint  Patient presents with  . Abdominal Pain    [name] is a 40 y.o. male.  Jose patient reports about an hour prior to arrival he woke up from sleep with some sharp epigastric abdominal pain that does not radiate. He denies nausea, vomiting, or diarrhea. He states he took a Percocet prior to arrival without relief. He states he drank about 6 or 8 beers this evening. He states he's had this pain before with pancreatitis.   PCP Dr Cindie Laroche  Past Medical History:  Diagnosis Date  . Back pain   . Bradycardia 08/17/2011  . COPD (chronic obstructive pulmonary disease) ([place])   . ETOH abuse   . GERD (gastroesophageal reflux disease)   . Macrocytosis 08/17/2011  . Pancreatitis   . Pneumonia   . SAH (subarachnoid hemorrhage) ([place])   . Seizures [facility])     Patient Active Problem List   Diagnosis Date Noted  . Diarrhea 10/09/2015  . Pancreatitis, alcoholic, acute   . Alcohol-induced chronic pancreatitis ([place])   . Acute pancreatitis   . Pancreatitis 12/26/2014  . Pancreatic pseudocyst 12/26/2014  . Hyponatremia 12/26/2014  . HTN (hypertension) 07/27/2013  . Abdominal pain 07/11/2012  . Macrocytosis 08/17/2011  . Marijuana abuse 08/17/2011  . Bradycardia 08/17/2011  . Alcohol withdrawal ([place]) 08/17/2011  . Elevated blood pressure 07/08/2011  . Left against medical advice 07/08/2011  . Acute alcoholic pancreatitis 0000000  . Alcohol abuse, continuous 07/07/2011  . Tobacco abuse 07/07/2011  . Seizure disorder ([place]) 07/07/2011  . Adynamic ileus ([place]) 07/07/2011  . Wheezing 07/07/2011  . COPD (chronic obstructive pulmonary disease) ([place]) 07/07/2011    Past Surgical History:  Procedure Laterality Date  . BACK SURGERY    . SEPTOPLASTY          Home Medications    Prior to Admission medications   Medication Sig Start Date End Date Taking? Authorizing Provider  albuterol (PROVENTIL HFA;VENTOLIN HFA) 108 (90 BASE) MCG/ACT inhaler Inhale 2 puffs into the lungs every 6 (six) hours as needed for wheezing or shortness of breath.   Yes Historical Provider, MD  albuterol (PROVENTIL) (2.5 MG/3ML) 0.083% nebulizer solution Take 3 mLs (2.5 mg total) by nebulization every 4 (four) hours as needed for wheezing or shortness of breath. 09/09/13  Yes Orpah Greek, MD  carbamazepine (TEGRETOL) 200 MG tablet Take 400 mg by mouth 2 (two) times daily.   Yes Historical Provider, MD  Fluticasone-Salmeterol (ADVAIR) 250-50 MCG/DOSE AEPB Inhale 1 puff into the lungs 2 (two) times daily.   Yes Historical Provider, MD  levETIRAcetam (KEPPRA) 500 MG tablet Take 500 mg by mouth 2 (two) times daily.    Yes Historical Provider, MD  oxyCODONE-acetaminophen (PERCOCET) 10-325 MG per tablet Take 1 tablet by mouth every 6 (six) hours as needed for pain.    Yes Historical Provider, MD  HYDROmorphone (DILAUDID) 2 MG tablet Take 1 tablet (2 mg total) by mouth every 4 (four) hours as needed for severe pain. 12/13/15   Kristen N Ward, DO  omeprazole (PRILOSEC) 20 MG capsule Take 1 po BID x 2 weeks then once a day 07/18/15   Rolland Porter, MD  ondansetron (ZOFRAN ODT) 4 MG disintegrating tablet Take 1 tablet (4 mg total) by mouth every  8 (eight) hours as needed for nausea or vomiting. 12/13/15   Delice Bison Ward, DO    Family History Family History  Problem Relation Age of Onset  . Coronary artery disease Mother     deceased age 58  . Seizures Father   . Alcohol abuse Father   . Colon cancer Neg Hx      Social History Social History  Substance Use Topics  . Smoking status: Current Every Day Smoker    Packs/day: 1.00    Years: 20.00    Types: Cigarettes  . Smokeless tobacco: Never Used  . Alcohol use 25.2 oz/week    42 Cans of beer per week      Comment: daily, at least 18 beers a day   On disability for seizures   Allergies   Review of patient's allergies indicates no known allergies.   Review of Systems Review of Systems  All other systems reviewed and are negative.    Physical Exam Updated Vital Signs BP (!) 193/114 (BP Location: Left Arm)   Pulse 72   Temp 97.8 F (36.6 C) (Oral)   Resp 22   Ht 5\' 9"  (1.753 m)   Wt 130 lb (59 kg)   SpO2 97%   BMI 19.20 kg/m   Vital signs normal except for hypertension   Physical Exam  Constitutional: He is oriented to person, place, and time. He appears well-developed and well-nourished.  Non-toxic appearance. He does not appear ill. No distress.  HENT:  Head: Normocephalic and atraumatic.  Right Ear: External ear normal.  Left Ear: External ear normal.  Nose: Nose normal. No mucosal edema or rhinorrhea.  Mouth/Throat: Oropharynx is clear and moist and mucous membranes are normal. No dental abscesses or uvula swelling.  Eyes: Conjunctivae and EOM are normal. Pupils are equal, round, and reactive to light.  Neck: Normal range of motion and full passive range of motion without pain. Neck supple.  Cardiovascular: Normal rate, regular rhythm and normal heart sounds.  Exam reveals no gallop and no friction rub.   No murmur heard. Pulmonary/Chest: Effort normal and breath sounds normal. No respiratory distress. He has no wheezes. He has no rhonchi. He has no rales. He exhibits no tenderness and no crepitus.  Abdominal: Soft. Normal appearance and bowel sounds are normal. He exhibits no distension. There is tenderness in the epigastric area. There is no rebound and no guarding.    Musculoskeletal: Normal range of motion. He exhibits no edema or tenderness.  Moves all extremities well.   Neurological: He is alert and oriented to person, place, and time. He has normal strength. No cranial nerve deficit.  Skin: Skin is warm, dry and intact. No rash noted. No erythema. No pallor.   Psychiatric: He has a normal mood and affect. His speech is normal and behavior is normal. His mood appears not anxious.  Nursing note and vitals reviewed.    ED Treatments / Results  Labs (all labs ordered are listed, but only abnormal results are displayed) Results for orders placed or performed during the hospital encounter of 05/28/16  Comprehensive metabolic panel  Result Value Ref Range   Sodium 130 (L) 135 - 145 mmol/L   Potassium 4.2 3.5 - 5.1 mmol/L   Chloride 100 (L) 101 - 111 mmol/L   CO2 26 22 - 32 mmol/L   Glucose, Bld 128 (H) 65 - 99 mg/dL   BUN 9 6 - 20 mg/dL   Creatinine, Ser 0.40 (L) 0.61 - 1.24 mg/dL  Calcium 8.7 (L) 8.9 - 10.3 mg/dL   Total Protein 7.3 6.5 - 8.1 g/dL   Albumin 4.0 3.5 - 5.0 g/dL   AST 33 15 - 41 U/L   ALT 25 17 - 63 U/L   Alkaline Phosphatase 98 38 - 126 U/L   Total Bilirubin 0.3 0.3 - 1.2 mg/dL   GFR calc non Af Amer >60 >60 mL/min   GFR calc Af Amer >60 >60 mL/min   Anion gap 4 (L) 5 - 15  Ethanol  Result Value Ref Range   Alcohol, Ethyl (B) <5 <5 mg/dL  Lipase, blood  Result Value Ref Range   Lipase 313 (H) 11 - 51 U/L  CBC with Differential  Result Value Ref Range   WBC 7.7 4.0 - 10.5 K/uL   RBC 4.11 (L) 4.22 - 5.81 MIL/uL   Hemoglobin 14.0 13.0 - 17.0 g/dL   HCT 40.9 39.0 - 52.0 %   MCV 99.5 78.0 - 100.0 fL   MCH 34.1 (H) 26.0 - 34.0 pg   MCHC 34.2 30.0 - 36.0 g/dL   RDW 12.5 11.5 - 15.5 %   Platelets 186 150 - 400 K/uL   Neutrophils Relative % 59 %   Neutro Abs 4.5 1.7 - 7.7 K/uL   Lymphocytes Relative 23 %   Lymphs Abs 1.8 0.7 - 4.0 K/uL   Monocytes Relative 15 %   Monocytes Absolute 1.2 (H) 0.1 - 1.0 K/uL   Eosinophils Relative 3 %   Eosinophils Absolute 0.2 0.0 - 0.7 K/uL   Basophils Relative 0 %   Basophils Absolute 0.0 0.0 - 0.1 K/uL  Urine rapid drug screen (hosp performed)  Result Value Ref Range   Opiates NONE DETECTED NONE DETECTED   Cocaine NONE DETECTED NONE DETECTED   Benzodiazepines POSITIVE (A) NONE  DETECTED   Amphetamines NONE DETECTED NONE DETECTED   Tetrahydrocannabinol POSITIVE (A) NONE DETECTED   Barbiturates NONE DETECTED NONE DETECTED   Laboratory interpretation all normal except Elevated lipase consistent with acute pancreatitis, hyponatremia consistent with dehydration    EKG  EKG Interpretation None       Radiology No results found.   CLINICAL DATA:  Left-sided abdominal pain for 4 days  EXAM: CT ABDOMEN AND PELVIS WITH CONTRAST  IMPRESSION: Stable appearing enlargement of the pancreatic head with a cystic lesion within. This again may be related to underlying pancreatitis. Evaluation by means of MRI is recommended when the patient's condition improves and optimum imaging can be performed.  Chronic changes without acute abnormality   Electronically Signed   By: Inez Catalina M.D.   On: 03/09/2015 12:43   Procedures Procedures (including critical care time)  Medications Ordered in ED Medications  sodium chloride 0.9 % bolus 1,000 mL (0 mLs Intravenous Stopped 05/28/16 0657)  sodium chloride 0.9 % bolus 500 mL (0 mLs Intravenous Stopped 05/28/16 0657)  metoCLOPramide (REGLAN) injection 10 mg (10 mg Intravenous Given 05/28/16 0539)  diphenhydrAMINE (BENADRYL) injection 25 mg (25 mg Intravenous Given 05/28/16 0540)  fentaNYL (SUBLIMAZE) injection 25 mcg (25 mcg Intravenous Given 05/28/16 0539)  famotidine (PEPCID) IVPB 20 mg premix (0 mg Intravenous Stopped 05/28/16 0657)  fentaNYL (SUBLIMAZE) injection 50 mcg (50 mcg Intravenous Given 05/28/16 [id number])     Initial Impression / Assessment and Plan / ED Course  I have reviewed the triage vital signs and the nursing notes.  Pertinent labs & imaging results that were available during my care of the patient were reviewed by me and considered in my  medical decision making (see chart for details).  Clinical Course   I discussed with patient that he needs to quit drinking alcohol, he seems to think drinking  only 6 or 8 beers is no big deal. He was given IV fluids and given IV Reglan and Benadryl. He was given a low-dose of fentanyl.  Patient was rechecked at 7 AM. He states he still having a lot of pain. He was given additional fentanyl. We discussed his test results. Patient seems to expect to have a CT scan every time he is admitted. I reviewed his last couple of CT scans and they have remained stable. We did discuss being admitted and he is agreeable.  07:19 AM Dr Judieth Keens, admit to med-surg, attending Dr Cindie Laroche   Final Clinical Impressions(s) / ED Diagnoses   Final diagnoses:  Alcohol induced acute pancreatitis    Plan admission  Rolland Porter, MD, Barbette Or, MD 05/28/16 [phone]

## 2016-05-28 NOTE — H&P (Signed)
History and Physical    [name] [id number] DOB: August 14, 1976 DOA: 05/28/2016  Referring MD/NP/PA: Rolland Porter, EDP PCP: Maricela Curet, MD  Patient coming from: Home  Chief Complaint: Abdominal pain  HPI: [name] is a 40 y.o. male with history of alcohol abuse who has had multiple admissions for acute alcoholic Pancreatitis. He states he went on a drinking binge yesterday and had at least 8 beers and woke up this morning at about 4 AM with severe epigastric and left upper quadrant pain as well as nausea. In the ED he was found to have a lipase of 313 and admission was requested for acute alcoholic pancreatitis.  Past Medical/Surgical History: Past Medical History:  Diagnosis Date  . Back pain   . Bradycardia 08/17/2011  . COPD (chronic obstructive pulmonary disease) ([place])   . ETOH abuse   . GERD (gastroesophageal reflux disease)   . Macrocytosis 08/17/2011  . Pancreatitis   . Pneumonia   . SAH (subarachnoid hemorrhage) ([place])   . Seizures ([place])     Past Surgical History:  Procedure Laterality Date  . BACK SURGERY    . SEPTOPLASTY      Social History:  reports that he has been smoking Cigarettes.  He has a 20.00 pack-year smoking history. He has never used smokeless tobacco. He reports that he drinks about 25.2 oz of alcohol per week . He reports that he does not use drugs.  Allergies: No Known Allergies  Family History:   Family History  Problem Relation Age of Onset  . Coronary artery disease Mother     deceased age 70  . Seizures Father   . Alcohol abuse Father   . Colon cancer Neg Hx     Prior to Admission medications   Medication Sig Start Date End Date Taking? Authorizing Provider  albuterol (PROVENTIL HFA;VENTOLIN HFA) 108 (90 BASE) MCG/ACT inhaler Inhale 2 puffs into the lungs every 6 (six) hours as needed for wheezing or shortness of breath.   Yes Historical Provider, MD  albuterol (PROVENTIL) (2.5 MG/3ML) 0.083% nebulizer solution  Take 3 mLs (2.5 mg total) by nebulization every 4 (four) hours as needed for wheezing or shortness of breath. 09/09/13  Yes Orpah Greek, MD  carbamazepine (TEGRETOL) 200 MG tablet Take 400 mg by mouth 2 (two) times daily.   Yes Historical Provider, MD  Fluticasone-Salmeterol (ADVAIR) 250-50 MCG/DOSE AEPB Inhale 1 puff into the lungs 2 (two) times daily.   Yes Historical Provider, MD  levETIRAcetam (KEPPRA) 500 MG tablet Take 500 mg by mouth 2 (two) times daily.    Yes Historical Provider, MD  oxyCODONE-acetaminophen (PERCOCET) 10-325 MG per tablet Take 1 tablet by mouth every 6 (six) hours as needed for pain.    Yes Historical Provider, MD  HYDROmorphone (DILAUDID) 2 MG tablet Take 1 tablet (2 mg total) by mouth every 4 (four) hours as needed for severe pain. 12/13/15   Kristen N Ward, DO  omeprazole (PRILOSEC) 20 MG capsule Take 1 po BID x 2 weeks then once a day 07/18/15   Rolland Porter, MD  ondansetron (ZOFRAN ODT) 4 MG disintegrating tablet Take 1 tablet (4 mg total) by mouth every 8 (eight) hours as needed for nausea or vomiting. 12/13/15   Delice Bison Ward, DO    Review of Systems:  Constitutional: Denies fever, chills, diaphoresis, appetite change and fatigue.  HEENT: Denies photophobia, eye pain, redness, hearing loss, ear pain, congestion, sore throat, rhinorrhea, sneezing, mouth sores, trouble swallowing, neck pain,  neck stiffness and tinnitus.   Respiratory: Denies SOB, DOE, cough, chest tightness,  and wheezing.   Cardiovascular: Denies chest pain, palpitations and leg swelling.  Gastrointestinal: Denies  vomiting, diarrhea, constipation, blood in stool and abdominal distention.  Genitourinary: Denies dysuria, urgency, frequency, hematuria, flank pain and difficulty urinating.  Endocrine: Denies: hot or cold intolerance, sweats, changes in hair or nails, polyuria, polydipsia. Musculoskeletal: Denies myalgias, back pain, joint swelling, arthralgias and gait problem.  Skin: Denies  pallor, rash and wound.  Neurological: Denies dizziness, seizures, syncope, weakness, light-headedness, numbness and headaches.  Hematological: Denies adenopathy. Easy bruising, personal or family bleeding history  Psychiatric/Behavioral: Denies suicidal ideation, mood changes, confusion, nervousness, sleep disturbance and agitation    Physical Exam: Vitals:   05/28/16 0700 05/28/16 0730 05/28/16 0800 05/28/16 0853  BP: 177/91 174/86 174/87 (!) 184/131  Pulse: 92 68 68 75  Resp:    20  Temp:    99.1 F (37.3 C)  TempSrc:    Oral  SpO2: 97% 93% 92% 97%  Weight:    58.9 kg (129 lb 14.4 oz)  Height:    5\' 9"  (1.753 m)     Constitutional: NAD, calm, Eyes: PERRL, lids and conjunctivae normal ENMT: Mucous membranes are moist. Posterior pharynx clear of any exudate or lesions.Normal dentition.  Neck: normal, supple, no masses, no thyromegaly Respiratory: clear to auscultation bilaterally, no wheezing, no crackles. Normal respiratory effort. No accessory muscle use.  Cardiovascular: Regular rate and rhythm, no murmurs / rubs / gallops. No extremity edema. 2+ pedal pulses. No carotid bruits.  Abdomen: Diffusely tender to palpation, normal bowel sounds Musculoskeletal: no clubbing / cyanosis. No joint deformity upper and lower extremities. Good ROM, no contractures. Normal muscle tone.  Skin: no rashes, lesions, ulcers. No induration Neurologic: CN 2-12 grossly intact. Sensation intact, DTR normal. Strength 5/5 in all 4.  Psychiatric: Normal judgment and insight. Alert and oriented x 3. Normal mood.    Labs on Admission: I have personally reviewed the following labs and imaging studies  CBC:  Recent Labs Lab 05/28/16 0550  WBC 7.7  NEUTROABS 4.5  HGB 14.0  HCT 40.9  MCV 99.5  PLT 99991111   Basic Metabolic Panel:  Recent Labs Lab 05/28/16 0550  NA 130*  K 4.2  CL 100*  CO2 26  GLUCOSE 128*  BUN 9  CREATININE 0.40*  CALCIUM 8.7*   GFR: Estimated Creatinine Clearance:  101.2 mL/min (by C-G formula based on SCr of 0.8 mg/dL). Liver Function Tests:  Recent Labs Lab 05/28/16 0550  AST 33  ALT 25  ALKPHOS 98  BILITOT 0.3  PROT 7.3  ALBUMIN 4.0    Recent Labs Lab 05/28/16 0550  LIPASE 313*   No results for input(s): AMMONIA in the last 168 hours. Coagulation Profile: No results for input(s): INR, PROTIME in the last 168 hours. Cardiac Enzymes: No results for input(s): CKTOTAL, CKMB, CKMBINDEX, TROPONINI in the last 168 hours. BNP (last 3 results) No results for input(s): PROBNP in the last 8760 hours. HbA1C: No results for input(s): HGBA1C in the last 72 hours. CBG: No results for input(s): GLUCAP in the last 168 hours. Lipid Profile: No results for input(s): CHOL, HDL, LDLCALC, TRIG, CHOLHDL, LDLDIRECT in the last 72 hours. Thyroid Function Tests: No results for input(s): TSH, T4TOTAL, FREET4, T3FREE, THYROIDAB in the last 72 hours. Anemia Panel: No results for input(s): VITAMINB12, FOLATE, FERRITIN, TIBC, IRON, RETICCTPCT in the last 72 hours. Urine analysis:    Component Value Date/Time  [place] YELLOW 12/12/2015 2217   APPEARANCEUR CLEAR 12/12/2015 2217   LABSPEC 1.010 12/12/2015 2217   PHURINE 6.0 12/12/2015 2217   GLUCOSEU NEGATIVE 12/12/2015 2217   HGBUR NEGATIVE 12/12/2015 2217   [place] NEGATIVE 12/12/2015 2217   KETONESUR NEGATIVE 12/12/2015 2217   PROTEINUR NEGATIVE 12/12/2015 2217   UROBILINOGEN 0.2 07/18/2015 0549   NITRITE NEGATIVE 12/12/2015 2217   LEUKOCYTESUR NEGATIVE 12/12/2015 2217   Sepsis Labs: @LABRCNTIP (procalcitonin:4,lacticidven:4) )No results found for this or any previous visit (from the past 240 hour(s)).   Radiological Exams on Admission: No results found.  EKG: Independently reviewed. None obtained in ED  Assessment/Plan Principal Problem:   Acute alcoholic pancreatitis Active Problems:   Alcohol abuse, continuous   Tobacco abuse   Seizure disorder ([place])    Acute alcoholic  pancreatitis -Nothing by mouth except for ice chips, Dilaudid IV every 3 hours. IV fluids  Seizure disorder -Continue Tegretol by mouth given no adequate IV conversion and transition Keppra to IV while he remains nothing by mouth.  Continuous alcohol abuse -Again counseled on cessation. -Thiamine/folate. -We'll monitor for withdrawals on the CIWA protocol.   DVT prophylaxis: Lovenox  Code Status: Full code  Family Communication: Patient only  Disposition Plan: To be determined  Consults called: None  Admission status: Inpatient    Time Spent: 55 minutes  Lelon Frohlich MD [facility] Pager [phone]  If 7PM-7AM, please contact night-coverage [email or web address] Password [email or web address]  05/28/2016, 11:30 AM

## 2016-05-28 NOTE — ED Triage Notes (Signed)
Pt c/o epigastric pain that started during the night, pt c/o lower back pain that radiates down left leg.

## 2016-05-29 LAB — COMPREHENSIVE METABOLIC PANEL
ALBUMIN: 4.3 g/dL (ref 3.5–5.0)
ALK PHOS: 110 U/L (ref 38–126)
ALT: 25 U/L (ref 17–63)
ANION GAP: 8 (ref 5–15)
AST: 36 U/L (ref 15–41)
BUN: 8 mg/dL (ref 6–20)
CALCIUM: 9.1 mg/dL (ref 8.9–10.3)
CO2: 28 mmol/L (ref 22–32)
CREATININE: 0.44 mg/dL — AB (ref 0.61–1.24)
Chloride: 98 mmol/L — ABNORMAL LOW (ref 101–111)
GFR calc Af Amer: >60 mL/min (ref >60)
GFR calc non Af Amer: >60 mL/min (ref >60)
GLUCOSE: 91 mg/dL (ref 65–99)
Potassium: 4.4 mmol/L (ref 3.5–5.1)
SODIUM: 134 mmol/L — AB (ref 135–145)
Total Bilirubin: 0.8 mg/dL (ref 0.3–1.2)
Total Protein: 7.8 g/dL (ref 6.5–8.1)

## 2016-05-29 LAB — CBC
HCT: 43.9 % (ref 39.0–52.0)
HEMOGLOBIN: 14.7 g/dL (ref 13.0–17.0)
MCH: 34.1 pg — AB (ref 26.0–34.0)
MCHC: 33.5 g/dL (ref 30.0–36.0)
MCV: 101.9 fL — ABNORMAL HIGH (ref 78.0–100.0)
Platelets: 189 10*3/uL (ref 150–400)
RBC: 4.31 MIL/uL (ref 4.22–5.81)
RDW: 12.5 % (ref 11.5–15.5)
WBC: 6.8 10*3/uL (ref 4.0–10.5)

## 2016-05-29 MED ORDER — HYDROMORPHONE HCL 1 MG/ML IJ SOLN
2.0000 mg | INTRAMUSCULAR | Status: DC | PRN
  Administered 2016-05-29 – 2016-05-30 (×7): 2 mg via INTRAVENOUS
  Filled 2016-05-29 (×7): qty 2

## 2016-05-29 MED ORDER — LEVETIRACETAM IN NACL 500 MG/100ML IV SOLN
500.0000 mg | Freq: Two times a day (BID) | INTRAVENOUS | Status: DC
  Administered 2016-05-29 – 2016-05-30 (×3): 500 mg via INTRAVENOUS
  Filled 2016-05-29 (×5): qty 100

## 2016-05-29 NOTE — Progress Notes (Signed)
Patient admitted with severe pancreatitis lipase at 313 LFTs trending towards normal currently on Dilaudid IV [name] [id number] DOB: 21-Apr-1976 DOA: 05/28/2016 PCP: Maricela Curet, MD   Physical Exam: Blood pressure (!) 138/93, pulse (!) 56, temperature 97.5 F (36.4 C), temperature source Oral, resp. rate 20, height 5\' 9"  (1.753 [name]), weight 58.9 kg (129 lb 14.4 oz), SpO2 95 %. Lungs show prolonged inspiratory and expiratory phase scattered coarse rhonchi mild end expiratory wheeze noted. Heart regular rhythm no S3 or S4 no heaves thrills rubs abdomen epigastric discomfort bowel sounds normoactive no guarding or rebound masses no megaly   Investigations:  No results found for this or any previous visit (from the past 240 hour(s)).   Basic Metabolic Panel:  Recent Labs  05/28/16 0550 05/29/16 0537  NA 130* 134*  K 4.2 4.4  CL 100* 98*  CO2 26 28  GLUCOSE 128* 91  BUN 9 8  CREATININE 0.40* 0.44*  CALCIUM 8.7* 9.1   Liver Function Tests:  Recent Labs  05/28/16 0550 05/29/16 0537  AST 33 36  ALT 25 25  ALKPHOS 98 110  BILITOT 0.3 0.8  PROT 7.3 7.8  ALBUMIN 4.0 4.3     CBC:  Recent Labs  05/28/16 0550 05/29/16 0537  WBC 7.7 6.8  NEUTROABS 4.5  --   HGB 14.0 14.7  HCT 40.9 43.9  MCV 99.5 101.9*  PLT 186 189    No results found.    Medications:   Impression:  Principal Problem:   Acute alcoholic pancreatitis Active Problems:   Alcohol abuse, continuous   Tobacco abuse   Seizure disorder ([place])     Plan: Continue current therapy measure lipase in a.[name]. continue clear liquid diet  Consultants:    Procedures   Antibiotics:            Time spent:   LOS: 1 day   [name]   05/29/2016, 12:16 PM

## 2016-05-29 NOTE — Care Management Note (Signed)
Case Management Note  Patient Details  Name: [name] MRN: [id number] Date of Birth: 01-11-1976  Subjective/Objective:                  Pt admitted with pancreatitis. Pt is from home and is ind with ADL's. Pt has PCP, transportation and no difficulty affording medications with Medicaid. He plans to return home with self care.   Action/Plan: No CM needs anticipated.   Expected Discharge Date:     05/31/2016             Expected Discharge Plan:  Home/Self Care  In-House Referral:  NA  Discharge planning Services  CM Consult  Post Acute Care Choice:  NA Choice offered to:  NA  DME Arranged:    DME Agency:     HH Arranged:    HH Agency:     Status of Service:  In process, will continue to follow  If discussed at Long Length of Stay Meetings, dates discussed:    Additional Comments:  Sherald Barge, RN 05/29/2016, 11:27 AM

## 2016-05-30 LAB — LIPASE, BLOOD: Lipase: 24 U/L (ref 11–51)

## 2016-05-30 MED ORDER — FOLIC ACID 1 MG PO TABS: 1.0000 mg | ORAL_TABLET | Freq: Every day | ORAL | 1 refills | Status: DC

## 2016-05-30 NOTE — Progress Notes (Signed)
MD in room with patient

## 2016-05-30 NOTE — Discharge Summary (Signed)
Physician Discharge Summary  ANTIWAN SOBALVARRO [id number] DOB: 1976-01-09 DOA: 05/28/2016  PCP: Maricela Curet, MD  Admit date: 05/28/2016 Discharge date: 05/30/2016   Recommendations for Outpatient Follow-up:  The patient is instructed to cease all alcohol to attend alcoholic's anonymous meetings on a daily basis to take all prehospital admission medicines as prescribed and follow-up my office in 3-4 days time to assess for any evidence of recurrent pancreatitis epigastric discomfort ethanol withdrawal issues ethanol resumption issues as well as to measure levels of antiseizure medicines Discharge Diagnoses:  Principal Problem:   Acute alcoholic pancreatitis Active Problems:   Alcohol abuse, continuous   Tobacco abuse   Seizure disorder [facility])   Discharge Condition: Good  Filed Weights   05/28/16 0510 05/28/16 0853  Weight: 59 kg (130 lb) 58.9 kg (129 lb 14.4 oz)    History of present illness:  The patient is a 40 year old white male with 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 who after drinking 6 date 12 ounce beers per day developed I'll call her pancreatitis was subsequently admitted he had borderline possibility of ethanol withdrawal he was for this he was placed on vitamins thiamine and Ativan protocol and he had no further issues this was weaned over 4-5 hospital days his lipase was in the 300s and subsequently down to 24 on the day of discharge and he was discharged on his prehospital admission medicines told to follow-up with AA to cease all alcohol and follow-up my office in 3-4 days time  Hospital Course:  See history of present illness above  Procedures:     Consultations:    Discharge Instructions  Discharge Instructions    Discharge instructions    Complete by:  As directed   Discharge patient    Complete by:  As directed       Medication List    TAKE these medications   albuterol 108 (90 Base) MCG/ACT inhaler Commonly known  as:  PROVENTIL HFA;VENTOLIN HFA Inhale 2 puffs into the lungs every 6 (six) hours as needed for wheezing or shortness of breath.   albuterol (2.5 MG/3ML) 0.083% nebulizer solution Commonly known as:  PROVENTIL Take 3 mLs (2.5 mg total) by nebulization every 4 (four) hours as needed for wheezing or shortness of breath.   carbamazepine 200 MG tablet Commonly known as:  TEGRETOL Take 400 mg by mouth 2 (two) times daily.   Fluticasone-Salmeterol 250-50 MCG/DOSE Aepb Commonly known as:  ADVAIR Inhale 1 puff into the lungs 2 (two) times daily.   folic acid 1 MG tablet Commonly known as:  FOLVITE Take 1 tablet (1 mg total) by mouth daily.   levETIRAcetam 500 MG tablet Commonly known as:  KEPPRA Take 500 mg by mouth 2 (two) times daily.   oxyCODONE-acetaminophen 10-325 MG tablet Commonly known as:  PERCOCET Take 1 tablet by mouth every 6 (six) hours as needed for pain.      No Known Allergies    The results of significant diagnostics from this hospitalization (including imaging, microbiology, ancillary and laboratory) are listed below for reference.    Significant Diagnostic Studies: No results found.  Microbiology: No results found for this or any previous visit (from the past 240 hour(s)).   Labs: Basic Metabolic Panel:  Recent Labs Lab 05/28/16 0550 05/29/16 0537  NA 130* 134*  K 4.2 4.4  CL 100* 98*  CO2 26 28  GLUCOSE 128* 91  BUN 9 8  CREATININE 0.40* 0.44*  CALCIUM 8.7* 9.1   Liver  Function Tests:  Recent Labs Lab 05/28/16 0550 05/29/16 0537  AST 33 36  ALT 25 25  ALKPHOS 98 110  BILITOT 0.3 0.8  PROT 7.3 7.8  ALBUMIN 4.0 4.3    Recent Labs Lab 05/28/16 0550 05/30/16 0534  LIPASE 313* 24   No results for input(s): AMMONIA in the last 168 hours. CBC:  Recent Labs Lab 05/28/16 0550 05/29/16 0537  WBC 7.7 6.8  NEUTROABS 4.5  --   HGB 14.0 14.7  HCT 40.9 43.9  MCV 99.5 101.9*  PLT 186 189   Cardiac Enzymes: No results for  input(s): CKTOTAL, CKMB, CKMBINDEX, TROPONINI in the last 168 hours. BNP: BNP (last 3 results) No results for input(s): BNP in the last 8760 hours.  ProBNP (last 3 results) No results for input(s): PROBNP in the last 8760 hours.  CBG: No results for input(s): GLUCAP in the last 168 hours.     Signed:  Natina Wiginton Jerilynn Mages  [facility] Pager: [phone] 05/30/2016, 12:02 PM

## 2016-05-30 NOTE — Progress Notes (Signed)
Pt's IV catheter removed and intact. Pt's IV site clean dry and intact. Discharge instructions including medications and follow up appointments were reviewed and discussed with patient. Pt verbalized understanding of discharge instructions. All questions were answered and no further questions at this time. Pt in stable condition and in no acute distress at time of discharge.

## 2016-05-30 NOTE — Progress Notes (Signed)
Pt complaining of left sided arm tenderness and discomfort. MD notified.

## 2016-08-30 ENCOUNTER — Other Ambulatory Visit (HOSPITAL_COMMUNITY): Payer: Self-pay | Admitting: Internal Medicine

## 2016-08-30 DIAGNOSIS — K7689 Other specified diseases of liver: Secondary | ICD-10-CM

## 2016-09-03 ENCOUNTER — Ambulatory Visit (HOSPITAL_COMMUNITY)
Admission: RE | Admit: 2016-09-03 | Discharge: 2016-09-03 | Disposition: A | Discharge status: Home / Self Care | Payer: Medicaid Other | Source: Ambulatory Visit | Attending: Internal Medicine | Admitting: Internal Medicine

## 2016-09-03 DIAGNOSIS — K7689 Other specified diseases of liver: Secondary | ICD-10-CM | POA: Diagnosis present

## 2016-09-30 ENCOUNTER — Encounter (HOSPITAL_COMMUNITY): Payer: Self-pay | Admitting: Emergency Medicine

## 2016-09-30 ENCOUNTER — Emergency Department (HOSPITAL_COMMUNITY)
Admission: EM | Admit: 2016-09-30 | Discharge: 2016-10-01 | Disposition: A | Discharge status: Home / Self Care | Payer: Medicaid Other | Attending: Emergency Medicine | Admitting: Emergency Medicine

## 2016-09-30 DIAGNOSIS — F17210 Nicotine dependence, cigarettes, uncomplicated: Secondary | ICD-10-CM | POA: Insufficient documentation

## 2016-09-30 DIAGNOSIS — I10 Essential (primary) hypertension: Secondary | ICD-10-CM | POA: Diagnosis not present

## 2016-09-30 DIAGNOSIS — J449 Chronic obstructive pulmonary disease, unspecified: Secondary | ICD-10-CM | POA: Insufficient documentation

## 2016-09-30 DIAGNOSIS — Z79899 Other long term (current) drug therapy: Secondary | ICD-10-CM | POA: Diagnosis not present

## 2016-09-30 DIAGNOSIS — R1013 Epigastric pain: Secondary | ICD-10-CM

## 2016-09-30 DIAGNOSIS — E871 Hypo-osmolality and hyponatremia: Secondary | ICD-10-CM | POA: Diagnosis not present

## 2016-09-30 LAB — COMPREHENSIVE METABOLIC PANEL
ALK PHOS: 90 U/L (ref 38–126)
ALT: 18 U/L (ref 17–63)
ANION GAP: 10 (ref 5–15)
AST: 37 U/L (ref 15–41)
Albumin: 4.4 g/dL (ref 3.5–5.0)
BUN: 4 mg/dL — ABNORMAL LOW (ref 6–20)
CALCIUM: 9.1 mg/dL (ref 8.9–10.3)
CO2: 23 mmol/L (ref 22–32)
CREATININE: 0.43 mg/dL — AB (ref 0.61–1.24)
Chloride: 86 mmol/L — ABNORMAL LOW (ref 101–111)
GFR calc non Af Amer: >60 mL/min (ref >60)
GLUCOSE: 82 mg/dL (ref 65–99)
Potassium: 4.2 mmol/L (ref 3.5–5.1)
SODIUM: 119 mmol/L — AB (ref 135–145)
TOTAL PROTEIN: 7.9 g/dL (ref 6.5–8.1)
Total Bilirubin: 0.8 mg/dL (ref 0.3–1.2)

## 2016-09-30 LAB — URINALYSIS, ROUTINE W REFLEX MICROSCOPIC
BILIRUBIN URINE: NEGATIVE
Glucose, UA: NEGATIVE mg/dL
HGB URINE DIPSTICK: NEGATIVE
Ketones, ur: NEGATIVE mg/dL
Leukocytes, UA: NEGATIVE
NITRITE: NEGATIVE
PROTEIN: NEGATIVE mg/dL
Specific Gravity, Urine: <1.005 — ABNORMAL LOW (ref 1.005–1.030)
pH: 7 (ref 5.0–8.0)

## 2016-09-30 LAB — CBC
HCT: 40.2 % (ref 39.0–52.0)
Hemoglobin: 14.5 g/dL (ref 13.0–17.0)
MCH: 34.7 pg — AB (ref 26.0–34.0)
MCHC: 36.1 g/dL — AB (ref 30.0–36.0)
MCV: 96.2 fL (ref 78.0–100.0)
PLATELETS: 269 10*3/uL (ref 150–400)
RBC: 4.18 MIL/uL — AB (ref 4.22–5.81)
RDW: 11.9 % (ref 11.5–15.5)
WBC: 7.7 10*3/uL (ref 4.0–10.5)

## 2016-09-30 LAB — LIPASE, BLOOD: Lipase: 20 U/L (ref 11–51)

## 2016-09-30 MED ORDER — HYDROMORPHONE HCL 1 MG/ML IJ SOLN
1.0000 mg | Freq: Once | INTRAMUSCULAR | Status: AC
  Administered 2016-09-30: 1 mg via INTRAVENOUS
  Filled 2016-09-30: qty 1

## 2016-09-30 MED ORDER — SODIUM CHLORIDE 0.9 % IV BOLUS (SEPSIS)
1000.0000 mL | Freq: Once | INTRAVENOUS | Status: AC
  Administered 2016-09-30: 1000 mL via INTRAVENOUS

## 2016-09-30 MED ORDER — ONDANSETRON HCL 4 MG/2ML IJ SOLN
4.0000 mg | Freq: Once | INTRAMUSCULAR | Status: AC
  Administered 2016-09-30: 4 mg via INTRAVENOUS
  Filled 2016-09-30: qty 2

## 2016-09-30 NOTE — ED Triage Notes (Signed)
Pt states that he has pancreatitis and has been having pain since yesterday

## 2016-09-30 NOTE — ED Notes (Signed)
Pt with abd pain for 2 days, admits to ETOH use yesterday.  Pt states hx of pancreatitis

## 2016-09-30 NOTE — ED Notes (Signed)
CRITICAL VALUE ALERT  Critical value received:  Sodium 119  Date of notification:  09/30/2016  Time of notification:  2138  Critical value read back:Yes.    Nurse who received alert:  Lucy Antigua, RN  Responding MD:  Dr. Dayna Barker  Time MD responded:  2138

## 2016-09-30 NOTE — ED Provider Notes (Signed)
[facility] DEPT Provider Note   CSN: [id number] Arrival date & time: 09/30/16  1927     History   Chief Complaint Chief Complaint  Patient presents with  . Abdominal Pain    HPI [name] is a 40 y.o. male.   Abdominal Pain   This is a new problem. The current episode started yesterday. The problem occurs constantly. The pain is associated with alcohol use. The pain is located in the epigastric region. The pain is mild. Pertinent negatives include anorexia, fever, flatus, melena, nausea and vomiting.    Past Medical History:  Diagnosis Date  . Back pain   . Bradycardia 08/17/2011  . COPD (chronic obstructive pulmonary disease) ([place])   . ETOH abuse   . GERD (gastroesophageal reflux disease)   . Macrocytosis 08/17/2011  . Pancreatitis   . Pneumonia   . SAH (subarachnoid hemorrhage) ([place])   . Seizures [facility])     Patient Active Problem List   Diagnosis Date Noted  . Diarrhea 10/09/2015  . Pancreatitis, alcoholic, acute   . Alcohol-induced chronic pancreatitis ([place])   . Acute pancreatitis   . Pancreatitis 12/26/2014  . Pancreatic pseudocyst 12/26/2014  . Hyponatremia 12/26/2014  . HTN (hypertension) 07/27/2013  . Abdominal pain 07/11/2012  . Macrocytosis 08/17/2011  . Marijuana abuse 08/17/2011  . Bradycardia 08/17/2011  . Alcohol withdrawal ([place]) 08/17/2011  . Elevated blood pressure 07/08/2011  . Left against medical advice 07/08/2011  . Acute alcoholic pancreatitis 0000000  . Alcohol abuse, continuous 07/07/2011  . Tobacco abuse 07/07/2011  . Seizure disorder ([place]) 07/07/2011  . Adynamic ileus ([place]) 07/07/2011  . Wheezing 07/07/2011  . COPD (chronic obstructive pulmonary disease) ([place]) 07/07/2011    Past Surgical History:  Procedure Laterality Date  . BACK SURGERY    . SEPTOPLASTY         Home Medications    Prior to Admission medications   Medication Sig Start Date End Date Taking? Authorizing Provider  albuterol (PROVENTIL  HFA;VENTOLIN HFA) 108 (90 BASE) MCG/ACT inhaler Inhale 2 puffs into the lungs every 6 (six) hours as needed for wheezing or shortness of breath.   Yes Historical Provider, MD  albuterol (PROVENTIL) (2.5 MG/3ML) 0.083% nebulizer solution Take 3 mLs (2.5 mg total) by nebulization every 4 (four) hours as needed for wheezing or shortness of breath. 09/09/13  Yes Orpah Greek, MD  carbamazepine (TEGRETOL) 200 MG tablet Take 400 mg by mouth 2 (two) times daily.   Yes Historical Provider, MD  folic acid (FOLVITE) 1 MG tablet Take 1 tablet (1 mg total) by mouth daily. 05/30/16  Yes Lucia Gaskins, MD  levETIRAcetam (KEPPRA) 500 MG tablet Take 500 mg by mouth 2 (two) times daily.    Yes Historical Provider, MD  oxyCODONE-acetaminophen (PERCOCET) 10-325 MG per tablet Take 1 tablet by mouth every 6 (six) hours as needed for pain.    Yes Historical Provider, MD    Family History Family History  Problem Relation Age of Onset  . Coronary artery disease Mother     deceased age 26  . Seizures Father   . Alcohol abuse Father   . Colon cancer Neg Hx     Social History Social History  Substance Use Topics  . Smoking status: Current Every Day Smoker    Packs/day: 1.00    Years: 20.00    Types: Cigarettes  . Smokeless tobacco: Never Used  . Alcohol use 25.2 oz/week    42 Cans of beer per week  Comment: daily, at least 18 beers a day      Allergies   Patient has no known allergies.   Review of Systems Review of Systems  Constitutional: Negative for fever.  Eyes: Negative for pain.  Respiratory: Negative for cough and shortness of breath.   Cardiovascular: Negative for chest pain.  Gastrointestinal: Positive for abdominal pain. Negative for anorexia, flatus, melena, nausea and vomiting.  All other systems reviewed and are negative.    Physical Exam Updated Vital Signs BP 188/94 (BP Location: Left Arm)   Pulse 60   Temp 98 F (36.7 C) (Tympanic)   Resp 20   Ht 5\' 9"  (1.753  m)   Wt 130 lb (59 kg)   SpO2 98%   BMI 19.20 kg/m   Physical Exam  Constitutional: He appears well-developed and well-nourished.  HENT:  Head: Normocephalic and atraumatic.  Eyes: Conjunctivae and EOM are normal.  Neck: Normal range of motion.  Cardiovascular: Normal rate.   Pulmonary/Chest: Effort normal. No respiratory distress. He exhibits tenderness.  Abdominal: Soft. He exhibits no distension. There is tenderness. There is no guarding.  Musculoskeletal: Normal range of motion.  Neurological: He is alert.  Skin: Skin is warm and dry.  Nursing note and vitals reviewed.    ED Treatments / Results  Labs (all labs ordered are listed, but only abnormal results are displayed) Labs Reviewed  COMPREHENSIVE METABOLIC PANEL - Abnormal; Notable for the following:       Result Value   Sodium 119 (*)    Chloride 86 (*)    BUN 4 (*)    Creatinine, Ser 0.43 (*)    All other components within normal limits  CBC - Abnormal; Notable for the following:    RBC 4.18 (*)    MCH 34.7 (*)    MCHC 36.1 (*)    All other components within normal limits  URINALYSIS, ROUTINE W REFLEX MICROSCOPIC (NOT AT [facility]) - Abnormal; Notable for the following:    Specific Gravity, Urine <1.005 (*)    All other components within normal limits  I-STAT CHEM 8, ED - Abnormal; Notable for the following:    Sodium 125 (*)    Chloride 98 (*)    BUN <3 (*)    Creatinine, Ser 0.30 (*)    Calcium, Ion 0.88 (*)    All other components within normal limits  LIPASE, BLOOD    EKG  EKG Interpretation None       Radiology No results found.  Procedures Procedures (including critical care time)  Medications Ordered in ED Medications  sodium chloride 0.9 % bolus 1,000 mL (0 mLs Intravenous Stopped 09/30/16 2304)  HYDROmorphone (DILAUDID) injection 1 mg (1 mg Intravenous Given 09/30/16 2110)  ondansetron (ZOFRAN) injection 4 mg (4 mg Intravenous Given 09/30/16 2110)  sodium chloride 0.9 % bolus 1,000 mL (0  mLs Intravenous Stopped 10/01/16 0025)  HYDROmorphone (DILAUDID) injection 1 mg (1 mg Intravenous Given 09/30/16 2151)  oxyCODONE-acetaminophen (PERCOCET/ROXICET) 5-325 MG per tablet 2 tablet (2 tablets Oral Given 10/01/16 0042)     Initial Impression / Assessment and Plan / ED Course  I have reviewed the triage vital signs and the nursing notes.  Pertinent labs & imaging results that were available during my care of the patient were reviewed by me and considered in my medical decision making (see chart for details).  Clinical Course     Evaluated for pancreatitis and not present at this time. Slightly low Na, so will give a couple  boluses and recheck for same. Pain controlled. No vomiting.   Recheck of sodium and is improving. Pain improved but not resolved. Lipase normal. Abdomen without peritonitis, doubt pancreatitis at this time. Patient tolerating PO without vomiting. Sitting up on bed and pain starts after I evaluate him. Will plan for outpatient management with close PCP follow up for hyponatremia and return here for new/worsening symptoms.   Final Clinical Impressions(s) / ED Diagnoses   Final diagnoses:  Epigastric pain  Hyponatremia    New Prescriptions Discharge Medication List as of 10/01/2016 12:26 AM       Merrily Pew, MD 10/03/16 [phone]

## 2016-10-01 LAB — I-STAT CHEM 8, ED
CREATININE: 0.3 mg/dL — AB (ref 0.61–1.24)
Calcium, Ion: 0.88 mmol/L — CL (ref 1.15–1.40)
Chloride: 98 mmol/L — ABNORMAL LOW (ref 101–111)
GLUCOSE: 92 mg/dL (ref 65–99)
HEMATOCRIT: 44 % (ref 39.0–52.0)
Hemoglobin: 15 g/dL (ref 13.0–17.0)
Potassium: 4.1 mmol/L (ref 3.5–5.1)
Sodium: 125 mmol/L — ABNORMAL LOW (ref 135–145)
TCO2: 16 mmol/L (ref 0–100)

## 2016-10-01 MED ORDER — OXYCODONE-ACETAMINOPHEN 5-325 MG PO TABS
2.0000 | ORAL_TABLET | Freq: Once | ORAL | Status: AC
  Administered 2016-10-01: 2 via ORAL
  Filled 2016-10-01: qty 2

## 2016-10-01 NOTE — ED Notes (Signed)
Pt states understanding of care given and follow up instructions.  Ambulated from ED with steady gait 

## 2016-10-08 ENCOUNTER — Emergency Department (HOSPITAL_COMMUNITY): Payer: Medicaid Other

## 2016-10-08 ENCOUNTER — Encounter (HOSPITAL_COMMUNITY): Payer: Self-pay | Admitting: *Deleted

## 2016-10-08 ENCOUNTER — Observation Stay (HOSPITAL_COMMUNITY)
Admission: EM | Admit: 2016-10-08 | Discharge: 2016-10-09 | Disposition: A | Discharge status: Home / Self Care | Payer: Medicaid Other | Attending: Nephrology | Admitting: Nephrology

## 2016-10-08 DIAGNOSIS — R0789 Other chest pain: Secondary | ICD-10-CM

## 2016-10-08 DIAGNOSIS — G8929 Other chronic pain: Secondary | ICD-10-CM | POA: Diagnosis not present

## 2016-10-08 DIAGNOSIS — E871 Hypo-osmolality and hyponatremia: Secondary | ICD-10-CM | POA: Diagnosis not present

## 2016-10-08 DIAGNOSIS — R072 Precordial pain: Secondary | ICD-10-CM | POA: Diagnosis present

## 2016-10-08 DIAGNOSIS — J449 Chronic obstructive pulmonary disease, unspecified: Secondary | ICD-10-CM | POA: Insufficient documentation

## 2016-10-08 DIAGNOSIS — Z79899 Other long term (current) drug therapy: Secondary | ICD-10-CM | POA: Insufficient documentation

## 2016-10-08 DIAGNOSIS — I10 Essential (primary) hypertension: Secondary | ICD-10-CM | POA: Diagnosis not present

## 2016-10-08 DIAGNOSIS — F17210 Nicotine dependence, cigarettes, uncomplicated: Secondary | ICD-10-CM | POA: Insufficient documentation

## 2016-10-08 DIAGNOSIS — F1010 Alcohol abuse, uncomplicated: Secondary | ICD-10-CM

## 2016-10-08 DIAGNOSIS — R1013 Epigastric pain: Secondary | ICD-10-CM | POA: Diagnosis not present

## 2016-10-08 DIAGNOSIS — Z720 Tobacco use: Secondary | ICD-10-CM

## 2016-10-08 DIAGNOSIS — E876 Hypokalemia: Principal | ICD-10-CM | POA: Diagnosis present

## 2016-10-08 DIAGNOSIS — R109 Unspecified abdominal pain: Secondary | ICD-10-CM

## 2016-10-08 LAB — BASIC METABOLIC PANEL
ANION GAP: 13 (ref 5–15)
Anion gap: 8 (ref 5–15)
Anion gap: 7 (ref 5–15)
BUN: 12 mg/dL (ref 6–20)
BUN: 9 mg/dL (ref 6–20)
BUN: 9 mg/dL (ref 6–20)
CALCIUM: 12.4 mg/dL — AB (ref 8.9–10.3)
CHLORIDE: 76 mmol/L — AB (ref 101–111)
CHLORIDE: 91 mmol/L — AB (ref 101–111)
CHLORIDE: 93 mmol/L — AB (ref 101–111)
CO2: 28 mmol/L (ref 22–32)
CO2: 25 mmol/L (ref 22–32)
CO2: 28 mmol/L (ref 22–32)
CREATININE: 0.55 mg/dL — AB (ref 0.61–1.24)
CREATININE: 0.41 mg/dL — AB (ref 0.61–1.24)
Calcium: 10.2 mg/dL (ref 8.9–10.3)
Calcium: 9.8 mg/dL (ref 8.9–10.3)
Creatinine, Ser: 0.58 mg/dL — ABNORMAL LOW (ref 0.61–1.24)
GFR calc non Af Amer: >60 mL/min (ref >60)
GFR calc non Af Amer: >60 mL/min (ref >60)
GFR calc non Af Amer: >60 mL/min (ref >60)
Glucose, Bld: 100 mg/dL — ABNORMAL HIGH (ref 65–99)
Glucose, Bld: 145 mg/dL — ABNORMAL HIGH (ref 65–99)
Glucose, Bld: 100 mg/dL — ABNORMAL HIGH (ref 65–99)
POTASSIUM: 3.4 mmol/L — AB (ref 3.5–5.1)
POTASSIUM: 3.7 mmol/L (ref 3.5–5.1)
Potassium: 3.1 mmol/L — ABNORMAL LOW (ref 3.5–5.1)
Sodium: 117 mmol/L — CL (ref 135–145)
Sodium: 124 mmol/L — ABNORMAL LOW (ref 135–145)
Sodium: 128 mmol/L — ABNORMAL LOW (ref 135–145)

## 2016-10-08 LAB — URINALYSIS, ROUTINE W REFLEX MICROSCOPIC
Bilirubin Urine: NEGATIVE
Glucose, UA: NEGATIVE mg/dL
Ketones, ur: 5 mg/dL — AB
LEUKOCYTES UA: NEGATIVE
Nitrite: NEGATIVE
PROTEIN: NEGATIVE mg/dL
SPECIFIC GRAVITY, URINE: 1.012 (ref 1.005–1.030)
pH: 5 (ref 5.0–8.0)

## 2016-10-08 LAB — COMPREHENSIVE METABOLIC PANEL
ALBUMIN: 3.8 g/dL (ref 3.5–5.0)
ALK PHOS: 83 U/L (ref 38–126)
ALT: 13 U/L — AB (ref 17–63)
AST: 23 U/L (ref 15–41)
Anion gap: 10 (ref 5–15)
BUN: 11 mg/dL (ref 6–20)
CALCIUM: 10.9 mg/dL — AB (ref 8.9–10.3)
CO2: 25 mmol/L (ref 22–32)
CREATININE: 0.48 mg/dL — AB (ref 0.61–1.24)
Chloride: 86 mmol/L — ABNORMAL LOW (ref 101–111)
GFR calc Af Amer: >60 mL/min (ref >60)
GFR calc non Af Amer: >60 mL/min (ref >60)
GLUCOSE: 84 mg/dL (ref 65–99)
Potassium: 2.9 mmol/L — ABNORMAL LOW (ref 3.5–5.1)
SODIUM: 121 mmol/L — AB (ref 135–145)
Total Bilirubin: 0.6 mg/dL (ref 0.3–1.2)
Total Protein: 6.7 g/dL (ref 6.5–8.1)

## 2016-10-08 LAB — TROPONIN I: Troponin I: <0.03 ng/mL (ref <0.03)

## 2016-10-08 LAB — RAPID URINE DRUG SCREEN, HOSP PERFORMED
Amphetamines: NOT DETECTED
BENZODIAZEPINES: NOT DETECTED
Barbiturates: NOT DETECTED
COCAINE: NOT DETECTED
OPIATES: NOT DETECTED
TETRAHYDROCANNABINOL: NOT DETECTED

## 2016-10-08 LAB — CBC
HCT: 37.6 % — ABNORMAL LOW (ref 39.0–52.0)
HEMOGLOBIN: 13.6 g/dL (ref 13.0–17.0)
MCH: 34.2 pg — AB (ref 26.0–34.0)
MCHC: 36.2 g/dL — ABNORMAL HIGH (ref 30.0–36.0)
MCV: 94.5 fL (ref 78.0–100.0)
Platelets: 265 10*3/uL (ref 150–400)
RBC: 3.98 MIL/uL — AB (ref 4.22–5.81)
RDW: 11.7 % (ref 11.5–15.5)
WBC: 7.9 10*3/uL (ref 4.0–10.5)

## 2016-10-08 LAB — LIPASE, BLOOD: Lipase: 21 U/L (ref 11–51)

## 2016-10-08 LAB — HEPATIC FUNCTION PANEL
ALBUMIN: 4.4 g/dL (ref 3.5–5.0)
ALT: 17 U/L (ref 17–63)
AST: 28 U/L (ref 15–41)
Alkaline Phosphatase: 100 U/L (ref 38–126)
BILIRUBIN TOTAL: 0.7 mg/dL (ref 0.3–1.2)
Bilirubin, Direct: 0.2 mg/dL (ref 0.1–0.5)
Indirect Bilirubin: 0.5 mg/dL (ref 0.3–0.9)
TOTAL PROTEIN: 7.5 g/dL (ref 6.5–8.1)

## 2016-10-08 LAB — NA AND K (SODIUM & POTASSIUM), RAND UR
Potassium Urine: 18 mmol/L
SODIUM UR: 28 mmol/L

## 2016-10-08 LAB — OSMOLALITY, URINE: Osmolality, Ur: 220 mOsm/kg — ABNORMAL LOW (ref 300–900)

## 2016-10-08 LAB — ETHANOL: Alcohol, Ethyl (B): <5 mg/dL (ref <5)

## 2016-10-08 MED ORDER — MORPHINE SULFATE (PF) 2 MG/ML IV SOLN
2.0000 mg | Freq: Three times a day (TID) | INTRAVENOUS | Status: DC | PRN
  Administered 2016-10-08 – 2016-10-09 (×3): 2 mg via INTRAVENOUS
  Filled 2016-10-08 (×3): qty 1

## 2016-10-08 MED ORDER — ALBUTEROL SULFATE (2.5 MG/3ML) 0.083% IN NEBU: 2.5000 mg | INHALATION_SOLUTION | RESPIRATORY_TRACT | Status: DC | PRN

## 2016-10-08 MED ORDER — LISINOPRIL 10 MG PO TABS
20.0000 mg | ORAL_TABLET | Freq: Every day | ORAL | Status: DC
  Administered 2016-10-08 – 2016-10-09 (×2): 20 mg via ORAL
  Filled 2016-10-08 (×2): qty 2

## 2016-10-08 MED ORDER — OXYCODONE-ACETAMINOPHEN 5-325 MG PO TABS
1.0000 | ORAL_TABLET | ORAL | Status: DC | PRN
  Administered 2016-10-08 – 2016-10-09 (×6): 1 via ORAL
  Filled 2016-10-08 (×6): qty 1

## 2016-10-08 MED ORDER — THIAMINE HCL 100 MG/ML IJ SOLN: 100.0000 mg | Freq: Every day | INTRAMUSCULAR | Status: DC

## 2016-10-08 MED ORDER — LORAZEPAM 1 MG PO TABS
1.0000 mg | ORAL_TABLET | Freq: Four times a day (QID) | ORAL | Status: DC | PRN
  Administered 2016-10-09: 1 mg via ORAL
  Filled 2016-10-08: qty 1

## 2016-10-08 MED ORDER — VITAMIN B-1 100 MG PO TABS
100.0000 mg | ORAL_TABLET | Freq: Every day | ORAL | Status: DC
  Administered 2016-10-08 – 2016-10-09 (×2): 100 mg via ORAL
  Filled 2016-10-08 (×2): qty 1

## 2016-10-08 MED ORDER — LEVETIRACETAM 500 MG PO TABS
500.0000 mg | ORAL_TABLET | Freq: Two times a day (BID) | ORAL | Status: DC
  Administered 2016-10-08 – 2016-10-09 (×3): 500 mg via ORAL
  Filled 2016-10-08 (×3): qty 1

## 2016-10-08 MED ORDER — CARBAMAZEPINE 200 MG PO TABS
400.0000 mg | ORAL_TABLET | Freq: Two times a day (BID) | ORAL | Status: DC
  Administered 2016-10-08 – 2016-10-09 (×3): 400 mg via ORAL
  Filled 2016-10-08 (×3): qty 2

## 2016-10-08 MED ORDER — MORPHINE SULFATE (PF) 2 MG/ML IV SOLN: 2.0000 mg | INTRAVENOUS | Status: DC | PRN

## 2016-10-08 MED ORDER — FOLIC ACID 1 MG PO TABS: 1.0000 mg | ORAL_TABLET | Freq: Every day | ORAL | Status: DC

## 2016-10-08 MED ORDER — OXYCODONE-ACETAMINOPHEN 5-325 MG PO TABS
1.0000 | ORAL_TABLET | Freq: Four times a day (QID) | ORAL | Status: DC | PRN
  Administered 2016-10-08: 1 via ORAL
  Filled 2016-10-08: qty 1

## 2016-10-08 MED ORDER — KCL IN DEXTROSE-NACL 20-5-0.9 MEQ/L-%-% IV SOLN
INTRAVENOUS | Status: DC
  Administered 2016-10-08: 06:00:00 via INTRAVENOUS

## 2016-10-08 MED ORDER — OXYCODONE-ACETAMINOPHEN 10-325 MG PO TABS: 1.0000 | ORAL_TABLET | Freq: Four times a day (QID) | ORAL | Status: DC | PRN

## 2016-10-08 MED ORDER — ACETAMINOPHEN 325 MG PO TABS
650.0000 mg | ORAL_TABLET | Freq: Once | ORAL | Status: AC
  Administered 2016-10-08: 650 mg via ORAL
  Filled 2016-10-08: qty 2

## 2016-10-08 MED ORDER — SODIUM CHLORIDE 0.9 % IV BOLUS (SEPSIS)
1000.0000 mL | Freq: Once | INTRAVENOUS | Status: AC
  Administered 2016-10-08: 1000 mL via INTRAVENOUS

## 2016-10-08 MED ORDER — OXYCODONE HCL 5 MG PO TABS
5.0000 mg | ORAL_TABLET | Freq: Four times a day (QID) | ORAL | Status: DC | PRN
  Administered 2016-10-08: 5 mg via ORAL
  Filled 2016-10-08: qty 1

## 2016-10-08 MED ORDER — LABETALOL HCL 5 MG/ML IV SOLN: 10.0000 mg | Freq: Four times a day (QID) | INTRAVENOUS | Status: DC | PRN

## 2016-10-08 MED ORDER — POTASSIUM CHLORIDE IN NACL 40-0.9 MEQ/L-% IV SOLN
INTRAVENOUS | Status: DC
  Administered 2016-10-08 – 2016-10-09 (×3): 100 mL/h via INTRAVENOUS

## 2016-10-08 MED ORDER — SODIUM CHLORIDE 0.9 % IV BOLUS (SEPSIS)
500.0000 mL | Freq: Once | INTRAVENOUS | Status: AC
  Administered 2016-10-08: 500 mL via INTRAVENOUS

## 2016-10-08 MED ORDER — KETOROLAC TROMETHAMINE 30 MG/ML IJ SOLN
30.0000 mg | Freq: Once | INTRAMUSCULAR | Status: AC
  Administered 2016-10-08: 30 mg via INTRAVENOUS
  Filled 2016-10-08: qty 1

## 2016-10-08 MED ORDER — FOLIC ACID 1 MG PO TABS
1.0000 mg | ORAL_TABLET | Freq: Every day | ORAL | Status: DC
  Administered 2016-10-08 – 2016-10-09 (×2): 1 mg via ORAL
  Filled 2016-10-08 (×2): qty 1

## 2016-10-08 MED ORDER — LORAZEPAM 2 MG/ML IJ SOLN: 1.0000 mg | Freq: Four times a day (QID) | INTRAMUSCULAR | Status: DC | PRN

## 2016-10-08 MED ORDER — POTASSIUM CHLORIDE CRYS ER 20 MEQ PO TBCR
40.0000 meq | EXTENDED_RELEASE_TABLET | Freq: Two times a day (BID) | ORAL | Status: DC
  Administered 2016-10-08 – 2016-10-09 (×3): 40 meq via ORAL
  Filled 2016-10-08 (×4): qty 2

## 2016-10-08 MED ORDER — ADULT MULTIVITAMIN W/MINERALS CH
1.0000 | ORAL_TABLET | Freq: Every day | ORAL | Status: DC
  Administered 2016-10-08 – 2016-10-09 (×2): 1 via ORAL
  Filled 2016-10-08 (×2): qty 1

## 2016-10-08 MED ORDER — METHOCARBAMOL 500 MG PO TABS
500.0000 mg | ORAL_TABLET | Freq: Three times a day (TID) | ORAL | Status: DC
  Administered 2016-10-08 – 2016-10-09 (×3): 500 mg via ORAL
  Filled 2016-10-08 (×3): qty 1

## 2016-10-08 MED ORDER — ALBUTEROL SULFATE HFA 108 (90 BASE) MCG/ACT IN AERS: 2.0000 | INHALATION_SPRAY | Freq: Four times a day (QID) | RESPIRATORY_TRACT | Status: DC | PRN

## 2016-10-08 NOTE — ED Triage Notes (Signed)
Pt states having chest pains for the past week. Says hurting all the way across his chest.

## 2016-10-08 NOTE — ED Notes (Signed)
Pt transported to xray via stretcher

## 2016-10-08 NOTE — H&P (Signed)
History and Physical    [name] [id number] DOB: 10-Apr-1976 DOA: 10/08/2016  PCP: Maricela Curet, MD  Patient coming from: Home.    Chief Complaint:  Nausea and vomiting.   HPI: [name] is an 40 y.o. male with hx of chronic hyponatremia, chronic alcoholic pancreatitis, HTN, chronic back pain on chronic narcotics, actively imbibing alcohol of about 6 beers per day, presented to the ER with abdominal pain, retrosternal pain, nausea, and vomiting.  He has no SOB, fever, or chills.  Evaluation in the ER showed Na of 117, Calcium of 12.4, and albumin was 4.0.  His EKG showed QTc of 486 ms, and his LFTs were normal.  His lipase was not elevated.  He has not been taking Calcium or Vit D supplement, and had no previous elevation of his Calcium.  CXR showed no hilar adenopathy. His K was slightly low at 3.1.  Hospitalist was asked to admit him for hyponatremia, hypercalcemia, and for atypical CP.     ED Course:  See above.  Past Medical History:  Diagnosis Date  . Back pain   . Bradycardia 08/17/2011  . COPD (chronic obstructive pulmonary disease) ([place])   . ETOH abuse   . GERD (gastroesophageal reflux disease)   . Macrocytosis 08/17/2011  . Pancreatitis   . Pneumonia   . SAH (subarachnoid hemorrhage) ([place])   . Seizures ([place])     Rewiew of Systems:  Constitutional: Negative for malaise, fever and chills. No significant weight loss or weight gain Eyes: Negative for eye pain, redness and discharge, diplopia, visual changes, or flashes of light. ENMT: Negative for ear pain, hoarseness, nasal congestion, sinus pressure and sore throat. No headaches; tinnitus, drooling, or problem swallowing. Cardiovascular: Negative for palpitations, diaphoresis, dyspnea and peripheral edema. ; No orthopnea, PND Respiratory: Negative for cough, hemoptysis, wheezing and stridor. No pleuritic chestpain. Gastrointestinal: Negative for nausea, vomiting, diarrhea, constipation,  melena, blood  in stool, hematemesis, jaundice and rectal bleeding.    Genitourinary: Negative for frequency, dysuria, incontinence,flank pain and hematuria; Musculoskeletal: Negative for back pain and neck pain. Negative for swelling and trauma.;  Skin: . Negative for pruritus, rash, abrasions, bruising and skin lesion.; ulcerations Neuro: Negative for headache, lightheadedness and neck stiffness. Negative for weakness, altered level of consciousness , altered mental status, extremity weakness, burning feet, involuntary movement, seizure and syncope.  Psych: negative for anxiety, depression, insomnia, tearfulness, panic attacks, hallucinations, paranoia, suicidal or homicidal ideation   Past Surgical History:  Procedure Laterality Date  . BACK SURGERY    . SEPTOPLASTY       reports that he has been smoking Cigarettes.  He has a 20.00 pack-year smoking history. He has never used smokeless tobacco. He reports that he drinks about 25.2 oz of alcohol per week . He reports that he uses drugs, including Marijuana.  No Known Allergies  Family History  Problem Relation Age of Onset  . Coronary artery disease Mother     deceased age 6  . Seizures Father   . Alcohol abuse Father   . Colon cancer Neg Hx      Prior to Admission medications   Medication Sig Start Date End Date Taking? Authorizing Provider  albuterol (PROVENTIL HFA;VENTOLIN HFA) 108 (90 BASE) MCG/ACT inhaler Inhale 2 puffs into the lungs every 6 (six) hours as needed for wheezing or shortness of breath.   Yes Historical Provider, MD  albuterol (PROVENTIL) (2.5 MG/3ML) 0.083% nebulizer solution Take 3 mLs (2.5 mg total) by nebulization every 4 (  four) hours as needed for wheezing or shortness of breath. 09/09/13  Yes Orpah Greek, MD  carbamazepine (TEGRETOL) 200 MG tablet Take 400 mg by mouth 2 (two) times daily.   Yes Historical Provider, MD  folic acid (FOLVITE) 1 MG tablet Take 1 tablet (1 mg total) by mouth daily. 05/30/16  Yes  Lucia Gaskins, MD  levETIRAcetam (KEPPRA) 500 MG tablet Take 500 mg by mouth 2 (two) times daily.    Yes Historical Provider, MD  oxyCODONE-acetaminophen (PERCOCET) 10-325 MG per tablet Take 1 tablet by mouth every 6 (six) hours as needed for pain.    Yes Historical Provider, MD    Physical Exam: Vitals:   10/08/16 0206 10/08/16 0240 10/08/16 0332 10/08/16 0435  BP: 159/83 (!) 164/118 171/73 161/79  Pulse: 74 82 85 81  Resp: 16 20 19 12   Temp:      TempSrc:      SpO2: 97% 98% 99% 98%  Weight:      Height:          Constitutional: NAD, calm, comfortable Vitals:   10/08/16 0206 10/08/16 0240 10/08/16 0332 10/08/16 0435  BP: 159/83 (!) 164/118 171/73 161/79  Pulse: 74 82 85 81  Resp: 16 20 19 12   Temp:      TempSrc:      SpO2: 97% 98% 99% 98%  Weight:      Height:       Eyes: PERRL, lids and conjunctivae normal ENMT: Mucous membranes are moist. Posterior pharynx clear of any exudate or lesions.Normal dentition.  Neck: normal, supple, no masses, no thyromegaly Respiratory: clear to auscultation bilaterally, no wheezing, no crackles. Normal respiratory effort. No accessory muscle use.  Cardiovascular: Regular rate and rhythm, no murmurs / rubs / gallops. No extremity edema. 2+ pedal pulses. No carotid bruits.  Abdomen: no tenderness, no masses palpated. No hepatosplenomegaly. Bowel sounds positive.  Musculoskeletal: no clubbing / cyanosis. No joint deformity upper and lower extremities. Good ROM, no contractures. Normal muscle tone.  Skin: no rashes, lesions, ulcers. No induration Neurologic: CN 2-12 grossly intact. Sensation intact, DTR normal. Strength 5/5 in all 4.  Psychiatric: Normal judgment and insight. Alert and oriented x 3. Normal mood.     Labs on Admission: I have personally reviewed following labs and imaging studies  CBC:  Recent Labs Lab 10/08/16 0242  WBC 7.9  HGB 13.6  HCT 37.6*  MCV 94.5  PLT 99991111   Basic Metabolic Panel:  Recent Labs Lab  10/08/16 0242  NA 117*  K 3.1*  CL 76*  CO2 28  GLUCOSE 100*  BUN 12  CREATININE 0.58*  CALCIUM 12.4*   GFR: Estimated Creatinine Clearance: 101.4 mL/min (by C-G formula based on SCr of 0.58 mg/dL (L)). Liver Function Tests:  Recent Labs Lab 10/08/16 0242  AST 28  ALT 17  ALKPHOS 100  BILITOT 0.7  PROT 7.5  ALBUMIN 4.4    Recent Labs Lab 10/08/16 0242  LIPASE 21  Cardiac Enzymes: Urine analysis:    Component Value Date/Time   COLORURINE YELLOW 10/08/2016 0320   APPEARANCEUR HAZY (A) 10/08/2016 0320   LABSPEC 1.012 10/08/2016 0320   PHURINE 5.0 10/08/2016 0320   GLUCOSEU NEGATIVE 10/08/2016 0320   HGBUR SMALL (A) 10/08/2016 0320   BILIRUBINUR NEGATIVE 10/08/2016 0320   KETONESUR 5 (A) 10/08/2016 0320   PROTEINUR NEGATIVE 10/08/2016 0320   UROBILINOGEN 0.2 07/18/2015 0549   NITRITE NEGATIVE 10/08/2016 0320   LEUKOCYTESUR NEGATIVE 10/08/2016 0320   Radiological Exams on  Admission: Dg Chest 2 View  Result Date: 10/08/2016 CLINICAL DATA:  Initial evaluation for acute chest pain. EXAM: CHEST  2 VIEW COMPARISON:  Prior radiograph from 09/29/2015. FINDINGS: Cardiac and mediastinal silhouettes are stable in size and contour, and remain within normal limits. Lungs are mildly hyperinflated. Architectural distortion at the left lung apex is stable from prior. No focal infiltrates. No pulmonary edema or pleural effusion. No pneumothorax. No acute osseous abnormality. Mild wedging of a mid thoracic vertebral body noted, stable. IMPRESSION: 1. No active cardiopulmonary disease. 2. Stable architectural distortion at the left lung apex. Electronically Signed   By: Jeannine Boga M.D.   On: 10/08/2016 03:11    EKG: Independently reviewed.   Assessment/Plan Principal Problem:   Hypercalcemia Active Problems:   Tobacco abuse   HTN (hypertension)   Hyponatremia   Hypokalemia    PLAN:   Atypical CP:  I don't think it is ACS.  It is rather palpable chest pain.  Will  continue with ASA and cycle his Troponins.  Hypercalcemia:  I wonder if it is from his pancreatitis.  Will give IVF with NS, and check PTH along with Vit D level.  CXR has no suggestion of sarcoidosis.  Will give IVF and follow Calcium level.   HypoNA:  Suspicious for beer potomania.  Will give IV NS and follow Na carefully.  Hypokalemia:  Check Magnesium.  Supplement K in IVF.  Chronic pancreatitis:  Suspect alcohol induced.  Lipase is normal.  Will follow.  BTW,  He expressed to me no desire to quit alcohol.  ETOH abuse:  Will give folate, Vit, thiamine, and place on Ativan CIWA oral PRN protocol.   DVT prophylaxis: None.  Early ambulation.  Code Status: FULL CODE.  Family Communication: None.  Disposition Plan: to home likely when appropriate.  Consults called: None.  Admission status: OBS.    Madalin Hughart MD FACP. [facility]  If 7PM-7AM, please contact night-coverage [email or web address] Password [email or web address]  10/08/2016, 4:40 AM

## 2016-10-08 NOTE — ED Notes (Signed)
Pt states his chest is still hurting. Informed EDP.

## 2016-10-08 NOTE — ED Notes (Signed)
ED Provider at bedside. 

## 2016-10-08 NOTE — ED Provider Notes (Signed)
[facility] DEPT Provider Note   CSN: [id number] Arrival date & time: 10/08/16  0125  Time seen 02:28 AM   History   Chief Complaint Chief Complaint  Patient presents with  . Chest Pain    HPI [name] is a 40 y.o. male.  HPI   patient is a frequent ED visitor for alcohol-induced abdominal pain. He was last seen in the ED on December 3 for the same. He states he still having the abdominal pain that he had in the epigastric area. He describes it as sharp. He states it radiates into his back. He states he drinks a sixpack of beer daily. He states his last drink was yesterday. He also states he's been having diffuse precordial chest pain for the past 5 days. He states it "hurts". He does finally characterize it as a pressure. Nothing makes it feel worse however Tums makes it feel better. He denies nausea, vomiting, shortness of breath, diaphoresis or diarrhea. Patient states he used to see Dr. Lorriane Shire and ran out of his pain pills. He states now he sees Dr. Maudie Mercury and he is getting his pain pills from them.  Patient states he is compliant with his seizure medication.  PCP Dr. Maudie Mercury  Past Medical History:  Diagnosis Date  . Back pain   . Bradycardia 08/17/2011  . COPD (chronic obstructive pulmonary disease) ([place])   . ETOH abuse   . GERD (gastroesophageal reflux disease)   . Macrocytosis 08/17/2011  . Pancreatitis   . Pneumonia   . SAH (subarachnoid hemorrhage) ([place])   . Seizures [facility])     Patient Active Problem List   Diagnosis Date Noted  . Diarrhea 10/09/2015  . Pancreatitis, alcoholic, acute   . Alcohol-induced chronic pancreatitis ([place])   . Acute pancreatitis   . Pancreatitis 12/26/2014  . Pancreatic pseudocyst 12/26/2014  . Hyponatremia 12/26/2014  . HTN (hypertension) 07/27/2013  . Abdominal pain 07/11/2012  . Macrocytosis 08/17/2011  . Marijuana abuse 08/17/2011  . Bradycardia 08/17/2011  . Alcohol withdrawal ([place]) 08/17/2011  . Elevated blood pressure  07/08/2011  . Left against medical advice 07/08/2011  . Acute alcoholic pancreatitis 0000000  . Alcohol abuse, continuous 07/07/2011  . Tobacco abuse 07/07/2011  . Seizure disorder ([place]) 07/07/2011  . Adynamic ileus ([place]) 07/07/2011  . Wheezing 07/07/2011  . COPD (chronic obstructive pulmonary disease) ([place]) 07/07/2011    Past Surgical History:  Procedure Laterality Date  . BACK SURGERY    . SEPTOPLASTY         Home Medications    Prior to Admission medications   Medication Sig Start Date End Date Taking? Authorizing Provider  albuterol (PROVENTIL HFA;VENTOLIN HFA) 108 (90 BASE) MCG/ACT inhaler Inhale 2 puffs into the lungs every 6 (six) hours as needed for wheezing or shortness of breath.   Yes Historical Provider, MD  albuterol (PROVENTIL) (2.5 MG/3ML) 0.083% nebulizer solution Take 3 mLs (2.5 mg total) by nebulization every 4 (four) hours as needed for wheezing or shortness of breath. 09/09/13  Yes Orpah Greek, MD  carbamazepine (TEGRETOL) 200 MG tablet Take 400 mg by mouth 2 (two) times daily.   Yes Historical Provider, MD  folic acid (FOLVITE) 1 MG tablet Take 1 tablet (1 mg total) by mouth daily. 05/30/16  Yes Lucia Gaskins, MD  levETIRAcetam (KEPPRA) 500 MG tablet Take 500 mg by mouth 2 (two) times daily.    Yes Historical Provider, MD  oxyCODONE-acetaminophen (PERCOCET) 10-325 MG per tablet Take 1 tablet by mouth every  6 (six) hours as needed for pain.    Yes Historical Provider, MD    Family History Family History  Problem Relation Age of Onset  . Coronary artery disease Mother     deceased age 71  . Seizures Father   . Alcohol abuse Father   . Colon cancer Neg Hx     Social History Social History  Substance Use Topics  . Smoking status: Current Every Day Smoker    Packs/day: 1.00    Years: 20.00    Types: Cigarettes  . Smokeless tobacco: Never Used  . Alcohol use 25.2 oz/week    42 Cans of beer per week     Comment: daily, at least 18 beers  a day   States he drinks 6 beers a day. Patient is on disability for seizures Patient smokes one half packs a day   Allergies   Patient has no known allergies.   Review of Systems Review of Systems  All other systems reviewed and are negative.    Physical Exam Updated Vital Signs BP (!) 164/118   Pulse 82   Temp 97.5 F (36.4 C) (Oral)   Resp 20   Ht 5\' 9"  (1.753 m)   Wt 130 lb (59 kg)   SpO2 98%   BMI 19.20 kg/m   Vital signs normal except for hypertension   Physical Exam  Constitutional: He is oriented to person, place, and time. He appears well-developed and well-nourished.  Non-toxic appearance. He does not appear ill. No distress.  HENT:  Head: Normocephalic and atraumatic.  Right Ear: External ear normal.  Left Ear: External ear normal.  Nose: Nose normal. No mucosal edema or rhinorrhea.  Mouth/Throat: Oropharynx is clear and moist and mucous membranes are normal. No dental abscesses or uvula swelling.  Eyes: Conjunctivae and EOM are normal. Pupils are equal, round, and reactive to light.  Neck: Normal range of motion and full passive range of motion without pain. Neck supple.  Cardiovascular: Normal rate, regular rhythm and normal heart sounds.  Exam reveals no gallop and no friction rub.   No murmur heard. Pulmonary/Chest: Effort normal and breath sounds normal. No respiratory distress. He has no wheezes. He has no rhonchi. He has no rales. He exhibits no tenderness and no crepitus.    Area of chest pain noted  Abdominal: Soft. Normal appearance and bowel sounds are normal. He exhibits no distension. There is tenderness. There is no rebound and no guarding.    Musculoskeletal: Normal range of motion. He exhibits no edema or tenderness.  Moves all extremities well.   Neurological: He is alert and oriented to person, place, and time. He has normal strength. No cranial nerve deficit.  Skin: Skin is warm, dry and intact. No rash noted. No erythema. No pallor.   Psychiatric: He has a normal mood and affect. His speech is normal and behavior is normal. His mood appears not anxious.  Nursing note and vitals reviewed.    ED Treatments / Results  Labs (all labs ordered are listed, but only abnormal results are displayed) Results for orders placed or performed during the hospital encounter of [id number]  Basic metabolic panel  Result Value Ref Range   Sodium 117 (LL) 135 - 145 mmol/L   Potassium 3.1 (L) 3.5 - 5.1 mmol/L   Chloride 76 (L) 101 - 111 mmol/L   CO2 28 22 - 32 mmol/L   Glucose, Bld 100 (H) 65 - 99 mg/dL   BUN 12 6 - 20  mg/dL   Creatinine, Ser 0.58 (L) 0.61 - 1.24 mg/dL   Calcium 12.4 (H) 8.9 - 10.3 mg/dL   GFR calc non Af Amer >60 >60 mL/min   GFR calc Af Amer >60 >60 mL/min   Anion gap 13 5 - 15  CBC  Result Value Ref Range   WBC 7.9 4.0 - 10.5 K/uL   RBC 3.98 (L) 4.22 - 5.81 MIL/uL   Hemoglobin 13.6 13.0 - 17.0 g/dL   HCT 37.6 (L) 39.0 - 52.0 %   MCV 94.5 78.0 - 100.0 fL   MCH 34.2 (H) 26.0 - 34.0 pg   MCHC 36.2 (H) 30.0 - 36.0 g/dL   RDW 11.7 11.5 - 15.5 %   Platelets 265 150 - 400 K/uL  Troponin I  Result Value Ref Range   Troponin I <0.03 <0.03 ng/mL  Ethanol  Result Value Ref Range   Alcohol, Ethyl (B) <5 <5 mg/dL  Hepatic function panel  Result Value Ref Range   Total Protein 7.5 6.5 - 8.1 g/dL   Albumin 4.4 3.5 - 5.0 g/dL   AST 28 15 - 41 U/L   ALT 17 17 - 63 U/L   Alkaline Phosphatase 100 38 - 126 U/L   Total Bilirubin 0.7 0.3 - 1.2 mg/dL   Bilirubin, Direct 0.2 0.1 - 0.5 mg/dL   Indirect Bilirubin 0.5 0.3 - 0.9 mg/dL  Lipase, blood  Result Value Ref Range   Lipase 21 11 - 51 U/L  Urine rapid drug screen (hosp performed)  Result Value Ref Range   Opiates NONE DETECTED NONE DETECTED   Cocaine NONE DETECTED NONE DETECTED   Benzodiazepines NONE DETECTED NONE DETECTED   Amphetamines NONE DETECTED NONE DETECTED   Tetrahydrocannabinol NONE DETECTED NONE DETECTED   Barbiturates NONE DETECTED NONE DETECTED    Urinalysis, Routine w reflex microscopic  Result Value Ref Range   Color, Urine YELLOW YELLOW   APPearance HAZY (A) CLEAR   Specific Gravity, Urine 1.012 1.005 - 1.030   pH 5.0 5.0 - 8.0   Glucose, UA NEGATIVE NEGATIVE mg/dL   Hgb urine dipstick SMALL (A) NEGATIVE   Bilirubin Urine NEGATIVE NEGATIVE   Ketones, ur 5 (A) NEGATIVE mg/dL   Protein, ur NEGATIVE NEGATIVE mg/dL   Nitrite NEGATIVE NEGATIVE   Leukocytes, UA NEGATIVE NEGATIVE   RBC / HPF 0-5 0 - 5 RBC/hpf   WBC, UA 0-5 0 - 5 WBC/hpf   Bacteria, UA RARE (A) NONE SEEN   Laboratory interpretation all normal except hyponatremia, new hypercalcemia since December 3, hypokalemia    EKG  EKG Interpretation  Date/Time:  Monday October 08 2016 01:33:02 EST Ventricular Rate:  82 PR Interval:    QRS Duration: 110 QT Interval:  390 QTC Calculation: 456 R Axis:   82 Text Interpretation:  Sinus rhythm Biatrial enlargement Left ventricular hypertrophy Nonspecific T abnormalities, lateral leads ST elev, probable normal early repol pattern No significant change since last tracing 13 Dec 2015 Confirmed by Leandria Thier  MD-I, Catarino Vold ([id number]) on 10/08/2016 1:45:48 AM       Radiology Dg Chest 2 View  Result Date: 10/08/2016 CLINICAL DATA:  Initial evaluation for acute chest pain. EXAM: CHEST  2 VIEW COMPARISON:  Prior radiograph from 09/29/2015. FINDINGS: Cardiac and mediastinal silhouettes are stable in size and contour, and remain within normal limits. Lungs are mildly hyperinflated. Architectural distortion at the left lung apex is stable from prior. No focal infiltrates. No pulmonary edema or pleural effusion. No pneumothorax. No acute osseous abnormality.  Mild wedging of a mid thoracic vertebral body noted, stable. IMPRESSION: 1. No active cardiopulmonary disease. 2. Stable architectural distortion at the left lung apex. Electronically Signed   By: Jeannine Boga M.D.   On: 10/08/2016 03:11    Procedures Procedures (including critical  care time)  Medications Ordered in ED Medications  sodium chloride 0.9 % bolus 1,000 mL (0 mLs Intravenous Stopped 10/08/16 0401)  ketorolac (TORADOL) 30 MG/ML injection 30 mg (30 mg Intravenous Given 10/08/16 0247)  acetaminophen (TYLENOL) tablet 650 mg (650 mg Oral Given 10/08/16 0313)  sodium chloride 0.9 % bolus 1,000 mL (1,000 mLs Intravenous New Bag/Given 10/08/16 0401)  sodium chloride 0.9 % bolus 500 mL (500 mLs Intravenous New Bag/Given 10/08/16 0402)     Initial Impression / Assessment and Plan / ED Course  I have reviewed the triage vital signs and the nursing notes.  Pertinent labs & imaging results that were available during my care of the patient were reviewed by me and considered in my medical decision making (see chart for details).  Clinical Course    Patient was given IV fluids. He was given IV Toradol for pain. He still complains of chest pain and then was given acetaminophen.  After reviewing his test results he was discussed with Dr. Burnard Bunting, at 4:21 AM. He is going to admit for his new onset hypercalcemia.   Review of the [place] shows patient got #56 oxycodone 10 mg tablets on December 9. These are prescribed by PA Olen Pel with Dr. Maudie Mercury. He has been getting this prescription every 2 weeks.  Final Clinical Impressions(s) / ED Diagnoses   Final diagnoses:  Hypercalcemia  Hyponatremia  Hypokalemia  Chronic abdominal pain  Atypical chest pain  Alcohol abuse   Plan admission  Rolland Porter, MD, Barbette Or, MD 10/08/16 [phone]

## 2016-10-08 NOTE — ED Notes (Signed)
CRITICAL VALUE ALERT  Critical value received:  Sodium- 117  Date of notification:  10/08/16  Time of notification:  0325  Critical value read back:Yes.    Nurse who received alert:  Idelia Salm, RN  MD notified (1st page):  Dr Tomi Bamberger  Time of first page:  [phone]  MD notified (2nd page):  Time of second page:  Responding MD:  Dr Tomi Bamberger  Time MD responded:  [phone]

## 2016-10-08 NOTE — Progress Notes (Signed)
PROGRESS NOTE    [name]  [id number] DOB: [birth year] DOA: 10/08/2016 PCP: No primary care provider on file.   Brief Narrative: 40 y.o. male with hx of chronic hyponatremia, chronic alcoholic pancreatitis, HTN, chronic back pain on chronic narcotics, actively imbibing alcohol of about 6 beers per day, presented to the ER with abdominal pain, retrosternal pain, nausea, and vomiting.  Assessment & Plan:   # Acute on chronic hyponatremia likely related with beer potomania -check urine sodium, urine osmolality -Serum sodium level improving from 117-121. Discontinue fluid with dextrose. Started normal saline with potassium chloride. The repeat lab in the unknown sort showed him of 124 with improvement in serum potassium level to 3.4. Avoid rapid correction. Continue IV fluid. Check BMP every 8 hourly.  #Hypokalemia, hypercalcemia: Replating electrolytes. Serum potassium level and an calcium improving. Continue IV hydration. I will check magnesium and phosphorus level in the morning.   #Atypical chest pain likely musculoskeletal in: Patient reported that he was lifting his mother-in-law because of her sickness. The chest pain is reproducible. Troponin negative. Started Robaxin and adjusted pain medication.  #Alcohol abuse: Was for alcohol withdrawal. Continue CIWA protocol. Continue IV fluid and vitamins. Patient is not ready to quit alcohol and not ready to accept education regarding quitting alcohol.    # HTN (hypertension): Blood pressure elevated. Resume home dose of lisinopril. I will discontinue hydrochlorothiazide because of  hyponatremia. Added labetalol when necessary for uncontrolled hypertension.  DVT prophylaxis: Early embolism and SCDs. Code Status: Full code Family Communication: No family present at bedside Disposition Plan: Likely discharge home tomorrow.  Consultants:   None  Procedures: None Antimicrobials: None  Subjective: Patient was seen and examined at  bedside. Patient reported chest repeat pain improvement with the pain medication. The pain is reproducible. No radiation. Denies shortness of breath, cough, headache, dizziness, nausea or vomiting. He feels weak. Denied nausea vomiting. No abdominal pain today.   Objective: Vitals:   10/08/16 0435 10/08/16 0532 10/08/16 1200 10/08/16 1405  BP: 161/79 (!) 172/73 (!) 157/83 (!) 176/79  Pulse: 81 68 77 63  Resp: 12 18  18   Temp:  97.6 F (36.4 C)  97.6 F (36.4 C)  TempSrc:  Oral    SpO2: 98% 100%  99%  Weight:  57.9 kg (127 lb 10.3 oz)    Height:  5\' 9"  (1.753 m)      Intake/Output Summary (Last 24 hours) at 10/08/16 1542 Last data filed at 10/08/16 1300  Gross per 24 hour  Intake          2822.08 ml  Output              600 ml  Net          2222.08 ml   Filed Weights   10/08/16 0131 10/08/16 0532  Weight: 59 kg (130 lb) 57.9 kg (127 lb 10.3 oz)    Examination:  General exam: Appears calm and comfortable  Respiratory system: Clear to auscultation. Respiratory effort normal. No wheezing or crackle.The chest pain is reproducible Cardiovascular system: S1 & S2 heard, RRR.  No pedal edema. Gastrointestinal system: Abdomen is nondistended, soft and nontender. Normal bowel sounds heard. Central nervous system: Alert and oriented. No focal neurological deficits. Extremities: Symmetric 5 x 5 power. No upper extremity tremor Skin: No rashes, lesions or ulcers Psychiatry: Judgement and insight appear normal. Mood & affect appropriate.     Data Reviewed: I have personally reviewed following labs and imaging studies  CBC:  Recent Labs Lab 10/08/16 0242  WBC 7.9  HGB 13.6  HCT 37.6*  MCV 94.5  PLT 99991111   Basic Metabolic Panel:  Recent Labs Lab 10/08/16 0242 10/08/16 0547 10/08/16 1256  NA 117* 121* 124*  K 3.1* 2.9* 3.4*  CL 76* 86* 91*  CO2 28 25 25   GLUCOSE 100* 84 145*  BUN 12 11 9   CREATININE 0.58* 0.48* 0.55*  CALCIUM 12.4* 10.9* 10.2   GFR: Estimated  Creatinine Clearance: 99.5 mL/min (by C-G formula based on SCr of 0.55 mg/dL (L)). Liver Function Tests:  Recent Labs Lab 10/08/16 0242 10/08/16 0547  AST 28 23  ALT 17 13*  ALKPHOS 100 83  BILITOT 0.7 0.6  PROT 7.5 6.7  ALBUMIN 4.4 3.8    Recent Labs Lab 10/08/16 0242  LIPASE 21   No results for input(s): AMMONIA in the last 168 hours. Coagulation Profile: No results for input(s): INR, PROTIME in the last 168 hours. Cardiac Enzymes:  Recent Labs Lab 10/08/16 0242 10/08/16 0547  TROPONINI <0.03 <0.03   BNP (last 3 results) No results for input(s): PROBNP in the last 8760 hours. HbA1C: No results for input(s): HGBA1C in the last 72 hours. CBG: No results for input(s): GLUCAP in the last 168 hours. Lipid Profile: No results for input(s): CHOL, HDL, LDLCALC, TRIG, CHOLHDL, LDLDIRECT in the last 72 hours. Thyroid Function Tests: No results for input(s): TSH, T4TOTAL, FREET4, T3FREE, THYROIDAB in the last 72 hours. Anemia Panel: No results for input(s): VITAMINB12, FOLATE, FERRITIN, TIBC, IRON, RETICCTPCT in the last 72 hours. Sepsis Labs: No results for input(s): PROCALCITON, LATICACIDVEN in the last 168 hours.  No results found for this or any previous visit (from the past 240 hour(s)).       Radiology Studies: Dg Chest 2 View  Result Date: 10/08/2016 CLINICAL DATA:  Initial evaluation for acute chest pain. EXAM: CHEST  2 VIEW COMPARISON:  Prior radiograph from 09/29/2015. FINDINGS: Cardiac and mediastinal silhouettes are stable in size and contour, and remain within normal limits. Lungs are mildly hyperinflated. Architectural distortion at the left lung apex is stable from prior. No focal infiltrates. No pulmonary edema or pleural effusion. No pneumothorax. No acute osseous abnormality. Mild wedging of a mid thoracic vertebral body noted, stable. IMPRESSION: 1. No active cardiopulmonary disease. 2. Stable architectural distortion at the left lung apex.  Electronically Signed   By: Jeannine Boga M.D.   On: 10/08/2016 03:11        Scheduled Meds: . carbamazepine  400 mg Oral BID  . folic acid  1 mg Oral Daily  . levETIRAcetam  500 mg Oral BID  . multivitamin with minerals  1 tablet Oral Daily  . potassium chloride  40 mEq Oral BID  . thiamine  100 mg Oral Daily   Or  . thiamine  100 mg Intravenous Daily   Continuous Infusions: . 0.9 % NaCl with KCl 40 mEq / L 100 mL/hr (10/08/16 1007)     LOS: 0 days    Jose Graefe Tanna Furry, MD [facility] Pager [phone]  If 7PM-7AM, please contact night-coverage [email or web address] Password [facility] 10/08/2016, 3:42 PM

## 2016-10-09 DIAGNOSIS — R0789 Other chest pain: Secondary | ICD-10-CM | POA: Diagnosis not present

## 2016-10-09 DIAGNOSIS — E871 Hypo-osmolality and hyponatremia: Secondary | ICD-10-CM | POA: Diagnosis not present

## 2016-10-09 DIAGNOSIS — F1010 Alcohol abuse, uncomplicated: Secondary | ICD-10-CM

## 2016-10-09 LAB — PTH, INTACT AND CALCIUM
CALCIUM TOTAL (PTH): 11 mg/dL — AB (ref 8.7–10.2)
PTH: 12 pg/mL — ABNORMAL LOW (ref 15–65)

## 2016-10-09 LAB — BASIC METABOLIC PANEL
Anion gap: 7 (ref 5–15)
BUN: 7 mg/dL (ref 6–20)
CO2: 26 mmol/L (ref 22–32)
Calcium: 9.2 mg/dL (ref 8.9–10.3)
Chloride: 95 mmol/L — ABNORMAL LOW (ref 101–111)
Creatinine, Ser: 0.36 mg/dL — ABNORMAL LOW (ref 0.61–1.24)
GFR calc Af Amer: >60 mL/min (ref >60)
Glucose, Bld: 125 mg/dL — ABNORMAL HIGH (ref 65–99)
POTASSIUM: 4 mmol/L (ref 3.5–5.1)
SODIUM: 128 mmol/L — AB (ref 135–145)

## 2016-10-09 LAB — MAGNESIUM: MAGNESIUM: 1.4 mg/dL — AB (ref 1.7–2.4)

## 2016-10-09 LAB — VITAMIN D 25 HYDROXY (VIT D DEFICIENCY, FRACTURES): VIT D 25 HYDROXY: 30.7 ng/mL (ref 30.0–100.0)

## 2016-10-09 LAB — PHOSPHORUS: Phosphorus: 1.5 mg/dL — ABNORMAL LOW (ref 2.5–4.6)

## 2016-10-09 MED ORDER — K PHOS MONO-SOD PHOS DI & MONO 155-852-130 MG PO TABS: 250.0000 mg | ORAL_TABLET | Freq: Two times a day (BID) | ORAL | 0 refills | Status: DC

## 2016-10-09 MED ORDER — K PHOS MONO-SOD PHOS DI & MONO 155-852-130 MG PO TABS
250.0000 mg | ORAL_TABLET | Freq: Two times a day (BID) | ORAL | Status: DC
  Administered 2016-10-09: 250 mg via ORAL
  Filled 2016-10-09 (×3): qty 1

## 2016-10-09 MED ORDER — LISINOPRIL 20 MG PO TABS: 20.0000 mg | ORAL_TABLET | Freq: Every day | ORAL | 0 refills | Status: DC

## 2016-10-09 MED ORDER — MAGNESIUM SULFATE 2 GM/50ML IV SOLN
2.0000 g | Freq: Once | INTRAVENOUS | Status: AC
  Administered 2016-10-09: 2 g via INTRAVENOUS
  Filled 2016-10-09: qty 50

## 2016-10-09 MED ORDER — METHOCARBAMOL 500 MG PO TABS: 500.0000 mg | ORAL_TABLET | Freq: Three times a day (TID) | ORAL | 0 refills | Status: DC | PRN

## 2016-10-09 NOTE — Discharge Summary (Signed)
Physician Discharge Summary  [name] [id number] DOB: [birth year] DOA: 10/08/2016  PCP: No primary care provider on file.  Admit date: 10/08/2016 Discharge date: 10/09/2016  Admitted From:Home Disposition: Home   Recommendations for Outpatient Follow-up:  1. Follow up with PCP in 1-2 weeks 2. Please obtain BMP, magnesium, phosphorus, CBC in a week with your PCP   Home Health: No Equipment/Devices: No Discharge Condition: Stable CODE STATUS: Full code Diet recommendation: Heart healthy  Brief/Interim Summary:40 y.o.malewith hx of chronic hyponatremia, chronic alcoholic pancreatitis, HTN, chronic back pain on chronic narcotics, actively imbibing alcohol of about 6 beers per day, presented to the ER with abdominal pain, retrosternal pain, nausea, and vomiting.  Please see below for detail: # Acute on chronic hyponatremia likely related with beer potomania and hydrochlorothiazide  -patient has  chronic hyponatremia presented with serum sodium level of 121 most likely because of beer and hydrochlorothiazide. I discussed this with the patient and he is not willing to cut back alcohol this time. I discontinued hydrochlorothiazide. He was treated with normal saline and potassium with improvement in serum sodium to 128 today. His serum sodium level is around baseline. Patient reported that he wants to go home around known and asking to discharge as soon as possible. I advised patient to monitor labs with his PCP in 1-2 weeks. I discussed with the social worker regarding PCP arrangement. Patient has no nausea vomiting, headache or weakness. He is clinically improving.  #Hypokalemia, hypercalcemia, hypomagnesemia, hypophosphatemia: Treated by drinking beer/alcohol. Repleted potassium, magnesium and phosphorus in the hospital. He is being discharged home with potassium phosphate tablet. Hypercalcemia improved with the IV hydration. It is close follow-up with his PCP and cut down on beer  intake.   #Atypical chest pain likely musculoskeletal in: Patient reported that he was lifting his mother-in-law because of her sickness. The chest pain is reproducible. Troponin negative. Started Robaxin. No chest pain today. Reported feeling good.  #Alcohol abuse: Was for alcohol withdrawal, CIWA protocol. He has no sign of alcohol withdrawal today. He looks calm comfortable and has no tremor. I discussed with the patient regarding cutting back beer intake because of multiple problems as discussed above. He said he is not willing to contact this time. He understand the complications.    # HTN (hypertension): Blood pressure medications adjusted. Continue lisinopril. Discontinued her diltiazem.   Discharge Diagnoses:  Principal Problem:   Hypercalcemia Active Problems:   Tobacco abuse   HTN (hypertension)   Hyponatremia   Hypokalemia    Discharge Instructions  Discharge Instructions    Call MD for:  difficulty breathing, headache or visual disturbances    Complete by:  As directed    Call MD for:  extreme fatigue    Complete by:  As directed    Call MD for:  hives    Complete by:  As directed    Call MD for:  persistant dizziness or light-headedness    Complete by:  As directed    Call MD for:  persistant nausea and vomiting    Complete by:  As directed    Call MD for:  severe uncontrolled pain    Complete by:  As directed    Call MD for:  temperature >100.4    Complete by:  As directed    Diet general    Complete by:  As directed    Discharge instructions    Complete by:  As directed    Please check lab including serum potassium, magnesium and  phosphorous in a week with your PCP.   Increase activity slowly    Complete by:  As directed        Medication List    STOP taking these medications   lisinopril-hydrochlorothiazide 20-12.5 MG tablet Commonly known as:  PRINZIDE,ZESTORETIC     TAKE these medications   albuterol 108 (90 Base) MCG/ACT inhaler Commonly  known as:  PROVENTIL HFA;VENTOLIN HFA Inhale 2 puffs into the lungs every 6 (six) hours as needed for wheezing or shortness of breath.   albuterol (2.5 MG/3ML) 0.083% nebulizer solution Commonly known as:  PROVENTIL Take 3 mLs (2.5 mg total) by nebulization every 4 (four) hours as needed for wheezing or shortness of breath.   carbamazepine 200 MG tablet Commonly known as:  TEGRETOL Take 400 mg by mouth 2 (two) times daily.   levETIRAcetam 500 MG tablet Commonly known as:  KEPPRA Take 500 mg by mouth 2 (two) times daily.   lisinopril 20 MG tablet Commonly known as:  PRINIVIL,ZESTRIL Take 1 tablet (20 mg total) by mouth daily. Start taking on:  10/10/2016   methocarbamol 500 MG tablet Commonly known as:  ROBAXIN Take 1 tablet (500 mg total) by mouth every 8 (eight) hours as needed for muscle spasms.   oxyCODONE-acetaminophen 10-325 MG tablet Commonly known as:  PERCOCET Take 1 tablet by mouth every 6 (six) hours as needed for pain.   phosphorus 155-852-130 MG tablet Commonly known as:  K PHOS NEUTRAL Take 1 tablet (250 mg total) by mouth 2 (two) times daily.      Follow-up Information    [place]. Schedule an appointment as soon as possible for a visit in 1 week(s).   Contact information: [place] [phone]         No Known Allergies  Consultations: None  Procedures/Studies: None  Subjective: Patient was seen and examined at bedside. Patient reported feeling good today and wanted to go home today as soon as possible. Denied headache, fever, chills, nausea, vomiting, chest pain, shortness of breath or abdominal pain.   Discharge Exam: Vitals:   10/08/16 2100 10/09/16 0500  BP: (!) 176/79 (!) 154/73  Pulse: (!) 52 (!) 56  Resp: 18 18  Temp: 97.7 F (36.5 C) 97.7 F (36.5 C)   Vitals:   10/08/16 1200 10/08/16 1405 10/08/16 2100 10/09/16 0500  BP: (!) 157/83 (!) 176/79 (!)  176/79 (!) 154/73  Pulse: 77 63 (!) 52 (!) 56  Resp:  18 18 18   Temp:  97.6 F (36.4 C) 97.7 F (36.5 C) 97.7 F (36.5 C)  TempSrc:   Oral Oral  SpO2:  99% 100% 100%  Weight:      Height:        General: Pt is alert, awake, not in acute distress, Oriented 3 Cardiovascular: RRR, S1/S2 +, no rubs, no gallops Respiratory: CTA bilaterally, no wheezing, no rhonchi Abdominal: Soft, NT, ND, bowel sounds + Extremities: no edema, no cyanosis No tremor in upper extremity or asterixis Psychiatric: Denied suicidal or homicidal ideation. Denied depressed mood.  The results of significant diagnostics from this hospitalization (including imaging, microbiology, ancillary and laboratory) are listed below for reference.     Microbiology: No results found for this or any previous visit (from the past 240 hour(s)).   Labs: BNP (last 3 results) No results for input(s): BNP in the last 8760 hours. Basic Metabolic Panel:  Recent Labs Lab 10/08/16 0242 10/08/16 0547 10/08/16 1256  10/08/16 2119 10/09/16 0444  NA 117* 121* 124* 128* 128*  K 3.1* 2.9* 3.4* 3.7 4.0  CL 76* 86* 91* 93* 95*  CO2 28 25 25 28 26   GLUCOSE 100* 84 145* 100* 125*  BUN 12 11 9 9 7   CREATININE 0.58* 0.48* 0.55* 0.41* 0.36*  CALCIUM 12.4* 10.9*  11.0* 10.2 9.8 9.2  MG  --   --   --   --  1.4*  PHOS  --   --   --   --  1.5*   Liver Function Tests:  Recent Labs Lab 10/08/16 0242 10/08/16 0547  AST 28 23  ALT 17 13*  ALKPHOS 100 83  BILITOT 0.7 0.6  PROT 7.5 6.7  ALBUMIN 4.4 3.8    Recent Labs Lab 10/08/16 0242  LIPASE 21   No results for input(s): AMMONIA in the last 168 hours. CBC:  Recent Labs Lab 10/08/16 0242  WBC 7.9  HGB 13.6  HCT 37.6*  MCV 94.5  PLT 265   Cardiac Enzymes:  Recent Labs Lab 10/08/16 0242 10/08/16 0547  TROPONINI <0.03 <0.03   BNP: Invalid input(s): POCBNP CBG: No results for input(s): GLUCAP in the last 168 hours. D-Dimer No results for input(s): DDIMER  in the last 72 hours. Hgb A1c No results for input(s): HGBA1C in the last 72 hours. Lipid Profile No results for input(s): CHOL, HDL, LDLCALC, TRIG, CHOLHDL, LDLDIRECT in the last 72 hours. Thyroid function studies No results for input(s): TSH, T4TOTAL, T3FREE, THYROIDAB in the last 72 hours.  Invalid input(s): FREET3 Anemia work up No results for input(s): VITAMINB12, FOLATE, FERRITIN, TIBC, IRON, RETICCTPCT in the last 72 hours. Urinalysis    Component Value Date/Time   COLORURINE YELLOW 10/08/2016 0320   APPEARANCEUR HAZY (A) 10/08/2016 0320   LABSPEC 1.012 10/08/2016 0320   PHURINE 5.0 10/08/2016 0320   GLUCOSEU NEGATIVE 10/08/2016 0320   HGBUR SMALL (A) 10/08/2016 0320   BILIRUBINUR NEGATIVE 10/08/2016 0320   KETONESUR 5 (A) 10/08/2016 0320   PROTEINUR NEGATIVE 10/08/2016 0320   UROBILINOGEN 0.2 07/18/2015 0549   NITRITE NEGATIVE 10/08/2016 0320   LEUKOCYTESUR NEGATIVE 10/08/2016 0320   Sepsis Labs Invalid input(s): PROCALCITONIN,  WBC,  LACTICIDVEN Microbiology No results found for this or any previous visit (from the past 240 hour(s)).   Time coordinating discharge: Over 30 minutes  SIGNED:   Rosita Fire, MD  [facility] 10/09/2016, 11:09 AM  If 7PM-7AM, please contact night-coverage [email or web address] Password [email or web address]

## 2016-10-09 NOTE — Progress Notes (Signed)
Patient discharged with instructions, prescription, and care notes.  Verbalized understanding via teach back.  IV was removed and the site was WNL. Patient voiced no further complaints or concerns at the time of discharge.  Appointments scheduled per instructions.  Patient left the floor via ambulation And staff in stable condition.

## 2016-10-09 NOTE — Care Management Note (Signed)
Case Management Note  Patient Details  Name: [name] MRN: [id number] Date of Birth: February 22, 1976  Subjective/Objective:                  Pt admitted with hypercalcemia. Pt is from home, lives with wife he is ind with ADL's. He has PCP, Dr. Jani Gravel, transportation and no difficulty affording medications.   Action/Plan: Pt discharging home today with self care. No CM needs.   Expected Discharge Date:    10/09/2016              Expected Discharge Plan:  Home/Self Care  In-House Referral:  NA  Discharge planning Services  CM Consult  Post Acute Care Choice:  NA Choice offered to:  NA  Status of Service:  Completed, signed off  Sherald Barge, RN 10/09/2016, 3:45 PM

## 2016-10-09 NOTE — Progress Notes (Signed)
PT caught smoking in room. Pt addressed verbally and told that it is not allowed in the hospital, his room or on the premises. Continue to monitor. Security to be notified if occurs again.

## 2017-01-23 ENCOUNTER — Emergency Department (HOSPITAL_COMMUNITY)
Admission: EM | Admit: 2017-01-23 | Discharge: 2017-01-23 | Disposition: A | Discharge status: Home / Self Care | Payer: Medicaid Other | Attending: Emergency Medicine | Admitting: Emergency Medicine

## 2017-01-23 ENCOUNTER — Encounter (HOSPITAL_COMMUNITY): Payer: Self-pay | Admitting: Emergency Medicine

## 2017-01-23 DIAGNOSIS — L309 Dermatitis, unspecified: Secondary | ICD-10-CM | POA: Diagnosis not present

## 2017-01-23 DIAGNOSIS — Z79899 Other long term (current) drug therapy: Secondary | ICD-10-CM | POA: Insufficient documentation

## 2017-01-23 DIAGNOSIS — R21 Rash and other nonspecific skin eruption: Secondary | ICD-10-CM | POA: Diagnosis present

## 2017-01-23 DIAGNOSIS — F17210 Nicotine dependence, cigarettes, uncomplicated: Secondary | ICD-10-CM | POA: Insufficient documentation

## 2017-01-23 DIAGNOSIS — J449 Chronic obstructive pulmonary disease, unspecified: Secondary | ICD-10-CM | POA: Insufficient documentation

## 2017-01-23 DIAGNOSIS — R0789 Other chest pain: Secondary | ICD-10-CM

## 2017-01-23 DIAGNOSIS — I10 Essential (primary) hypertension: Secondary | ICD-10-CM | POA: Insufficient documentation

## 2017-01-23 MED ORDER — PREDNISONE 20 MG PO TABS: ORAL_TABLET | ORAL | 0 refills | Status: DC

## 2017-01-23 MED ORDER — OXYCODONE-ACETAMINOPHEN 5-325 MG PO TABS
1.0000 | ORAL_TABLET | Freq: Once | ORAL | Status: AC
  Administered 2017-01-23: 1 via ORAL
  Filled 2017-01-23: qty 1

## 2017-01-23 MED ORDER — IBUPROFEN 800 MG PO TABS: 800.0000 mg | ORAL_TABLET | Freq: Three times a day (TID) | ORAL | 0 refills | Status: DC

## 2017-01-23 MED ORDER — IBUPROFEN 800 MG PO TABS
800.0000 mg | ORAL_TABLET | Freq: Once | ORAL | Status: AC
  Administered 2017-01-23: 800 mg via ORAL
  Filled 2017-01-23: qty 1

## 2017-01-23 MED ORDER — CYCLOBENZAPRINE HCL 10 MG PO TABS: 10.0000 mg | ORAL_TABLET | Freq: Three times a day (TID) | ORAL | 0 refills | Status: DC | PRN

## 2017-01-23 MED ORDER — CYCLOBENZAPRINE HCL 10 MG PO TABS
10.0000 mg | ORAL_TABLET | Freq: Once | ORAL | Status: AC
  Administered 2017-01-23: 10 mg via ORAL
  Filled 2017-01-23: qty 1

## 2017-01-23 MED ORDER — PREDNISONE 50 MG PO TABS
60.0000 mg | ORAL_TABLET | Freq: Once | ORAL | Status: AC
  Administered 2017-01-23: 04:00:00 60 mg via ORAL
  Filled 2017-01-23: qty 1

## 2017-01-23 NOTE — ED Triage Notes (Signed)
Pt c/o right rib pain when he woke this am. Pt denies any injury.

## 2017-01-23 NOTE — ED Provider Notes (Signed)
[facility] DEPT Provider Note   CSN: [id number] Arrival date & time: 01/23/17  0250     History   Chief Complaint Chief Complaint  Patient presents with  . Chest Pain    HPI [name] is a 41 y.o. male.  Patient presents to the ER for evaluation of right sided chest pain. Patient reports that he was awakened from sleep with a sharp pain under the right arm and in the chest area. He denies any direct injury to the area, but did "cleanup real hard" before he went to bed.  Patient also has a rash on his extremities. He reports that the area is very itchy. Rash has been ongoing for weeks.      Past Medical History:  Diagnosis Date  . Back pain   . Bradycardia 08/17/2011  . COPD (chronic obstructive pulmonary disease) ([place])   . ETOH abuse   . GERD (gastroesophageal reflux disease)   . Macrocytosis 08/17/2011  . Pancreatitis   . Pneumonia   . SAH (subarachnoid hemorrhage) ([place])   . Seizures [facility])     Patient Active Problem List   Diagnosis Date Noted  . Alcohol abuse   . Atypical chest pain   . Hypercalcemia 10/08/2016  . Hypokalemia 10/08/2016  . Diarrhea 10/09/2015  . Pancreatitis, alcoholic, acute   . Alcohol-induced chronic pancreatitis ([place])   . Acute pancreatitis   . Pancreatitis 12/26/2014  . Pancreatic pseudocyst 12/26/2014  . Hyponatremia 12/26/2014  . HTN (hypertension) 07/27/2013  . Abdominal pain 07/11/2012  . Macrocytosis 08/17/2011  . Marijuana abuse 08/17/2011  . Bradycardia 08/17/2011  . Alcohol withdrawal ([place]) 08/17/2011  . Elevated blood pressure 07/08/2011  . Left against medical advice 07/08/2011  . Acute alcoholic pancreatitis 14/78/2956  . Alcohol abuse, continuous 07/07/2011  . Tobacco abuse 07/07/2011  . Seizure disorder ([place]) 07/07/2011  . Adynamic ileus ([place]) 07/07/2011  . Wheezing 07/07/2011  . COPD (chronic obstructive pulmonary disease) ([place]) 07/07/2011    Past Surgical History:  Procedure Laterality Date  .  BACK SURGERY    . SEPTOPLASTY         Home Medications    Prior to Admission medications   Medication Sig Start Date End Date Taking? Authorizing Provider  albuterol (PROVENTIL HFA;VENTOLIN HFA) 108 (90 BASE) MCG/ACT inhaler Inhale 2 puffs into the lungs every 6 (six) hours as needed for wheezing or shortness of breath.   Yes Historical Provider, MD  albuterol (PROVENTIL) (2.5 MG/3ML) 0.083% nebulizer solution Take 3 mLs (2.5 mg total) by nebulization every 4 (four) hours as needed for wheezing or shortness of breath. 09/09/13  Yes Orpah Greek, MD  carbamazepine (TEGRETOL) 200 MG tablet Take 400 mg by mouth 2 (two) times daily.   Yes Historical Provider, MD  levETIRAcetam (KEPPRA) 500 MG tablet Take 500 mg by mouth 2 (two) times daily.    Yes Historical Provider, MD  lisinopril (PRINIVIL,ZESTRIL) 20 MG tablet Take 1 tablet (20 mg total) by mouth daily. 10/10/16  Yes Dron Tanna Furry, MD  oxyCODONE-acetaminophen (PERCOCET) 10-325 MG per tablet Take 1 tablet by mouth every 6 (six) hours as needed for pain.    Yes Historical Provider, MD  phosphorus (K PHOS NEUTRAL) 155-852-130 MG tablet Take 1 tablet (250 mg total) by mouth 2 (two) times daily. 10/09/16  Yes Dron Tanna Furry, MD  methocarbamol (ROBAXIN) 500 MG tablet Take 1 tablet (500 mg total) by mouth every 8 (eight) hours as needed for muscle spasms. 10/09/16   Dron Reesa Chew  Carolin Sicks, MD    Family History Family History  Problem Relation Age of Onset  . Coronary artery disease Mother     deceased age 55  . Seizures Father   . Alcohol abuse Father   . Colon cancer Neg Hx     Social History Social History  Substance Use Topics  . Smoking status: Current Every Day Smoker    Packs/day: 1.00    Years: 20.00    Types: Cigarettes  . Smokeless tobacco: Never Used  . Alcohol use 25.2 oz/week    42 Cans of beer per week     Comment: daily, at least 18 beers a day      Allergies   Patient has no known  allergies.   Review of Systems Review of Systems  Cardiovascular: Positive for chest pain.  Skin: Positive for rash.  All other systems reviewed and are negative.    Physical Exam Updated Vital Signs BP (!) 136/107   Pulse 88   Temp 97.6 F (36.4 C)   Resp 20   Ht 5\' 9"  (1.753 m)   Wt 130 lb (59 kg)   SpO2 94%   BMI 19.20 kg/m   Physical Exam  Constitutional: He is oriented to person, place, and time. He appears well-developed and well-nourished. No distress.  HENT:  Head: Normocephalic and atraumatic.  Right Ear: Hearing normal.  Left Ear: Hearing normal.  Nose: Nose normal.  Mouth/Throat: Oropharynx is clear and moist and mucous membranes are normal.  Eyes: Conjunctivae and EOM are normal. Pupils are equal, round, and reactive to light.  Neck: Normal range of motion. Neck supple.  Cardiovascular: Regular rhythm, S1 normal and S2 normal.  Exam reveals no gallop and no friction rub.   No murmur heard. Pulmonary/Chest: Effort normal and breath sounds normal. No respiratory distress. He exhibits tenderness. He exhibits no crepitus.    Abdominal: Soft. Normal appearance and bowel sounds are normal. There is no hepatosplenomegaly. There is no tenderness. There is no rebound, no guarding, no tenderness at McBurney's point and negative Murphy's sign. No hernia.  Musculoskeletal: Normal range of motion.  Neurological: He is alert and oriented to person, place, and time. He has normal strength. No cranial nerve deficit or sensory deficit. Coordination normal. GCS eye subscore is 4. GCS verbal subscore is 5. GCS motor subscore is 6.  Skin: Skin is warm, dry and intact. Rash (Diffuse patchy erythema with scaling and excoriations) noted. No cyanosis.  Psychiatric: He has a normal mood and affect. His speech is normal and behavior is normal. Thought content normal.  Nursing note and vitals reviewed.    ED Treatments / Results  Labs (all labs ordered are listed, but only abnormal  results are displayed) Labs Reviewed - No data to display  EKG  EKG Interpretation None       Radiology No results found.  Procedures Procedures (including critical care time)  Medications Ordered in ED Medications  oxyCODONE-acetaminophen (PERCOCET/ROXICET) 5-325 MG per tablet 1 tablet (not administered)  predniSONE (DELTASONE) tablet 60 mg (not administered)     Initial Impression / Assessment and Plan / ED Course  I have reviewed the triage vital signs and the nursing notes.  Pertinent labs & imaging results that were available during my care of the patient were reviewed by me and considered in my medical decision making (see chart for details).     Patient does have exquisite tenderness to the right lateral mid rib fields without any crepitance. Lungs are  clear. No overlying vesicular rash to suggest shingles. Patient denies any direct trauma. Lungs are clear, no concern for pneumothorax or pneumonia. Does not require imaging. Treat for musculoskeletal pain.  She does have a significant rash. Etiology unclear. Does appear to be allergic in nature. Treat with prednisone taper and antihistamine. Follow-up with PCP.  Final Clinical Impressions(s) / ED Diagnoses   Final diagnoses:  None    New Prescriptions New Prescriptions   No medications on file     Orpah Greek, MD 01/23/17 0320

## 2017-02-20 ENCOUNTER — Other Ambulatory Visit (HOSPITAL_COMMUNITY): Payer: Self-pay | Admitting: Internal Medicine

## 2017-02-21 ENCOUNTER — Other Ambulatory Visit (HOSPITAL_COMMUNITY): Payer: Self-pay | Admitting: Internal Medicine

## 2017-02-21 DIAGNOSIS — R1909 Other intra-abdominal and pelvic swelling, mass and lump: Secondary | ICD-10-CM

## 2017-02-26 ENCOUNTER — Encounter (HOSPITAL_COMMUNITY): Payer: Self-pay

## 2017-02-26 ENCOUNTER — Ambulatory Visit (HOSPITAL_COMMUNITY)
Admission: RE | Admit: 2017-02-26 | Discharge: 2017-02-26 | Disposition: A | Discharge status: Home / Self Care | Payer: Medicaid Other | Source: Ambulatory Visit | Attending: Internal Medicine | Admitting: Internal Medicine

## 2017-03-06 ENCOUNTER — Ambulatory Visit (HOSPITAL_COMMUNITY)
Admission: RE | Admit: 2017-03-06 | Discharge: 2017-03-06 | Disposition: A | Discharge status: Home / Self Care | Payer: Medicaid Other | Source: Ambulatory Visit | Attending: Internal Medicine | Admitting: Internal Medicine

## 2017-03-06 DIAGNOSIS — R1909 Other intra-abdominal and pelvic swelling, mass and lump: Secondary | ICD-10-CM | POA: Insufficient documentation

## 2017-03-06 DIAGNOSIS — I700 Atherosclerosis of aorta: Secondary | ICD-10-CM | POA: Insufficient documentation

## 2017-03-06 MED ORDER — GADOBENATE DIMEGLUMINE 529 MG/ML IV SOLN
15.0000 mL | Freq: Once | INTRAVENOUS | Status: AC | PRN
  Administered 2017-03-06: 11 mL via INTRAVENOUS

## 2017-06-18 ENCOUNTER — Emergency Department (HOSPITAL_COMMUNITY): Payer: Medicaid Other

## 2017-06-18 ENCOUNTER — Encounter (HOSPITAL_COMMUNITY): Payer: Self-pay | Admitting: Cardiology

## 2017-06-18 ENCOUNTER — Emergency Department (HOSPITAL_COMMUNITY)
Admission: EM | Admit: 2017-06-18 | Discharge: 2017-06-18 | Disposition: A | Discharge status: Home / Self Care | Payer: Medicaid Other | Attending: Emergency Medicine | Admitting: Emergency Medicine

## 2017-06-18 DIAGNOSIS — I10 Essential (primary) hypertension: Secondary | ICD-10-CM | POA: Diagnosis not present

## 2017-06-18 DIAGNOSIS — K852 Alcohol induced acute pancreatitis without necrosis or infection: Secondary | ICD-10-CM | POA: Insufficient documentation

## 2017-06-18 DIAGNOSIS — Z79899 Other long term (current) drug therapy: Secondary | ICD-10-CM | POA: Diagnosis not present

## 2017-06-18 DIAGNOSIS — R109 Unspecified abdominal pain: Secondary | ICD-10-CM | POA: Diagnosis present

## 2017-06-18 DIAGNOSIS — F17210 Nicotine dependence, cigarettes, uncomplicated: Secondary | ICD-10-CM | POA: Diagnosis not present

## 2017-06-18 DIAGNOSIS — F10188 Alcohol abuse with other alcohol-induced disorder: Secondary | ICD-10-CM | POA: Insufficient documentation

## 2017-06-18 DIAGNOSIS — J449 Chronic obstructive pulmonary disease, unspecified: Secondary | ICD-10-CM | POA: Insufficient documentation

## 2017-06-18 DIAGNOSIS — Z8719 Personal history of other diseases of the digestive system: Secondary | ICD-10-CM | POA: Diagnosis not present

## 2017-06-18 LAB — DIFFERENTIAL
BASOS ABS: 0 10*3/uL (ref 0.0–0.1)
Basophils Relative: 1 %
EOS ABS: 0.2 10*3/uL (ref 0.0–0.7)
Eosinophils Relative: 3 %
LYMPHS ABS: 1.9 10*3/uL (ref 0.7–4.0)
LYMPHS PCT: 28 %
Monocytes Absolute: 1.4 10*3/uL — ABNORMAL HIGH (ref 0.1–1.0)
Monocytes Relative: 21 %
NEUTROS PCT: 47 %
Neutro Abs: 3.1 10*3/uL (ref 1.7–7.7)

## 2017-06-18 LAB — COMPREHENSIVE METABOLIC PANEL
ALBUMIN: 4 g/dL (ref 3.5–5.0)
ALT: 26 U/L (ref 17–63)
AST: 37 U/L (ref 15–41)
Alkaline Phosphatase: 127 U/L — ABNORMAL HIGH (ref 38–126)
Anion gap: 11 (ref 5–15)
BILIRUBIN TOTAL: 0.5 mg/dL (ref 0.3–1.2)
BUN: 7 mg/dL (ref 6–20)
CHLORIDE: 87 mmol/L — AB (ref 101–111)
CO2: 28 mmol/L (ref 22–32)
Calcium: 9.2 mg/dL (ref 8.9–10.3)
Creatinine, Ser: 0.37 mg/dL — ABNORMAL LOW (ref 0.61–1.24)
GFR calc Af Amer: >60 mL/min (ref >60)
GFR calc non Af Amer: >60 mL/min (ref >60)
GLUCOSE: 98 mg/dL (ref 65–99)
POTASSIUM: 3.7 mmol/L (ref 3.5–5.1)
Sodium: 126 mmol/L — ABNORMAL LOW (ref 135–145)
TOTAL PROTEIN: 7.8 g/dL (ref 6.5–8.1)

## 2017-06-18 LAB — CBC
HEMATOCRIT: 38.3 % — AB (ref 39.0–52.0)
Hemoglobin: 13.5 g/dL (ref 13.0–17.0)
MCH: 34.4 pg — ABNORMAL HIGH (ref 26.0–34.0)
MCHC: 35.2 g/dL (ref 30.0–36.0)
MCV: 97.5 fL (ref 78.0–100.0)
Platelets: 249 10*3/uL (ref 150–400)
RBC: 3.93 MIL/uL — ABNORMAL LOW (ref 4.22–5.81)
RDW: 11.5 % (ref 11.5–15.5)
WBC: 6.6 10*3/uL (ref 4.0–10.5)

## 2017-06-18 LAB — LIPASE, BLOOD: Lipase: 51 U/L (ref 11–51)

## 2017-06-18 LAB — URINALYSIS, ROUTINE W REFLEX MICROSCOPIC
BACTERIA UA: NONE SEEN
Bilirubin Urine: NEGATIVE
Glucose, UA: 50 mg/dL — AB
Hgb urine dipstick: NEGATIVE
Ketones, ur: NEGATIVE mg/dL
Leukocytes, UA: NEGATIVE
Nitrite: NEGATIVE
Protein, ur: 30 mg/dL — AB
SPECIFIC GRAVITY, URINE: 1.023 (ref 1.005–1.030)
SQUAMOUS EPITHELIAL / LPF: NONE SEEN
pH: 5 (ref 5.0–8.0)

## 2017-06-18 MED ORDER — ONDANSETRON 4 MG PO TBDP: ORAL_TABLET | ORAL | 0 refills | Status: DC

## 2017-06-18 MED ORDER — SODIUM CHLORIDE 0.9 % IV BOLUS (SEPSIS)
2000.0000 mL | Freq: Once | INTRAVENOUS | Status: AC
  Administered 2017-06-18: 2000 mL via INTRAVENOUS

## 2017-06-18 MED ORDER — ONDANSETRON HCL 4 MG/2ML IJ SOLN
4.0000 mg | Freq: Once | INTRAMUSCULAR | Status: AC
  Administered 2017-06-18: 4 mg via INTRAVENOUS
  Filled 2017-06-18: qty 2

## 2017-06-18 MED ORDER — HYDROMORPHONE HCL 1 MG/ML IJ SOLN
1.0000 mg | Freq: Once | INTRAMUSCULAR | Status: AC
  Administered 2017-06-18: 1 mg via INTRAVENOUS
  Filled 2017-06-18: qty 1

## 2017-06-18 MED ORDER — HYDROMORPHONE HCL 4 MG PO TABS: 4.0000 mg | ORAL_TABLET | Freq: Four times a day (QID) | ORAL | 0 refills | Status: DC | PRN

## 2017-06-18 MED ORDER — PANTOPRAZOLE SODIUM 40 MG IV SOLR
40.0000 mg | Freq: Once | INTRAVENOUS | Status: AC
  Administered 2017-06-18: 40 mg via INTRAVENOUS
  Filled 2017-06-18: qty 40

## 2017-06-18 MED ORDER — IOPAMIDOL (ISOVUE-300) INJECTION 61%
100.0000 mL | Freq: Once | INTRAVENOUS | Status: AC | PRN
  Administered 2017-06-18: 100 mL via INTRAVENOUS

## 2017-06-18 NOTE — ED Provider Notes (Signed)
[facility] DEPT Provider Note   CSN: [id number] Arrival date & time: 06/18/17  1005     History   Chief Complaint Chief Complaint  Patient presents with  . Abdominal Pain    HPI JOBE MUTCH is a 41 y.o. male.  Patient complains of abdominal pain. He also had some vomiting a few days ago. He has a history of pancreatitis and has been drinking alcohol.   The history is provided by the patient.  Abdominal Pain   This is a new problem. The current episode started more than 2 days ago. The problem occurs constantly. The problem has not changed since onset.The pain is associated with alcohol use. The pain is located in the generalized abdominal region. The pain is at a severity of 5/10. The pain is moderate. Pertinent negatives include anorexia, diarrhea, frequency, hematuria and headaches. Nothing aggravates the symptoms.    Past Medical History:  Diagnosis Date  . Back pain   . Bradycardia 08/17/2011  . COPD (chronic obstructive pulmonary disease) ([place])   . ETOH abuse   . GERD (gastroesophageal reflux disease)   . Macrocytosis 08/17/2011  . Pancreatitis   . Pneumonia   . SAH (subarachnoid hemorrhage) ([place])   . Seizures [facility])     Patient Active Problem List   Diagnosis Date Noted  . Alcohol abuse   . Atypical chest pain   . Hypercalcemia 10/08/2016  . Hypokalemia 10/08/2016  . Diarrhea 10/09/2015  . Pancreatitis, alcoholic, acute   . Alcohol-induced chronic pancreatitis ([place])   . Acute pancreatitis   . Pancreatitis 12/26/2014  . Pancreatic pseudocyst 12/26/2014  . Hyponatremia 12/26/2014  . HTN (hypertension) 07/27/2013  . Abdominal pain 07/11/2012  . Macrocytosis 08/17/2011  . Marijuana abuse 08/17/2011  . Bradycardia 08/17/2011  . Alcohol withdrawal ([place]) 08/17/2011  . Elevated blood pressure 07/08/2011  . Left against medical advice 07/08/2011  . Acute alcoholic pancreatitis 11/91/4782  . Alcohol abuse, continuous 07/07/2011  . Tobacco abuse  07/07/2011  . Seizure disorder ([place]) 07/07/2011  . Adynamic ileus ([place]) 07/07/2011  . Wheezing 07/07/2011  . COPD (chronic obstructive pulmonary disease) ([place]) 07/07/2011    Past Surgical History:  Procedure Laterality Date  . BACK SURGERY    . SEPTOPLASTY         Home Medications    Prior to Admission medications   Medication Sig Start Date End Date Taking? Authorizing Provider  albuterol (PROVENTIL HFA;VENTOLIN HFA) 108 (90 BASE) MCG/ACT inhaler Inhale 2 puffs into the lungs every 6 (six) hours as needed for wheezing or shortness of breath.   Yes [provider]  albuterol (PROVENTIL) (2.5 MG/3ML) 0.083% nebulizer solution Take 3 mLs (2.5 mg total) by nebulization every 4 (four) hours as needed for wheezing or shortness of breath. 09/09/13  Yes Pollina, Gwenyth Allegra, MD  carbamazepine (TEGRETOL) 200 MG tablet Take 400 mg by mouth 2 (two) times daily.   Yes [provider]  ibuprofen (ADVIL,MOTRIN) 800 MG tablet Take 1 tablet (800 mg total) by mouth 3 (three) times daily. 01/23/17  Yes Pollina, Gwenyth Allegra, MD  levETIRAcetam (KEPPRA) 500 MG tablet Take 500 mg by mouth 2 (two) times daily.    Yes [provider]  lisinopril (PRINIVIL,ZESTRIL) 20 MG tablet Take 1 tablet (20 mg total) by mouth daily. 10/10/16  Yes Rosita Fire, MD  Multiple Vitamins-Minerals (MULTIVITAMIN ADULTS) TABS Take 1 tablet by mouth daily.   Yes [provider]  oxyCODONE-acetaminophen (PERCOCET) 10-325 MG per tablet Take 1 tablet by  mouth every 6 (six) hours as needed for pain.    Yes [provider]  HYDROmorphone (DILAUDID) 4 MG tablet Take 1 tablet (4 mg total) by mouth every 6 (six) hours as needed for severe pain. 06/18/17   Milton Ferguson, MD  ondansetron (ZOFRAN ODT) 4 MG disintegrating tablet 4mg  ODT q4 hours prn nausea/vomit 06/18/17   Milton Ferguson, MD    Family History Family History  Problem Relation Age of Onset  . Coronary artery disease  Mother        deceased age 29  . Seizures Father   . Alcohol abuse Father   . Colon cancer Neg Hx     Social History Social History  Substance Use Topics  . Smoking status: Current Every Day Smoker    Packs/day: 1.00    Years: 20.00    Types: Cigarettes  . Smokeless tobacco: Never Used  . Alcohol use 25.2 oz/week    42 Cans of beer per week     Comment: 4-6 beers a day      Allergies   Patient has no known allergies.   Review of Systems Review of Systems  Constitutional: Negative for appetite change and fatigue.  HENT: Negative for congestion, ear discharge and sinus pressure.   Eyes: Negative for discharge.  Respiratory: Negative for cough.   Cardiovascular: Negative for chest pain.  Gastrointestinal: Positive for abdominal pain. Negative for anorexia and diarrhea.  Genitourinary: Negative for frequency and hematuria.  Musculoskeletal: Negative for back pain.  Skin: Negative for rash.  Neurological: Negative for seizures and headaches.  Psychiatric/Behavioral: Negative for hallucinations.     Physical Exam Updated Vital Signs BP (!) 166/111 (BP Location: Left Arm)   Pulse 84   Temp 97.8 F (36.6 C) (Oral)   Resp 20   Ht 5\' 9"  (1.753 m)   Wt 54.9 kg (121 lb)   SpO2 98%   BMI 17.87 kg/m   Physical Exam  Constitutional: He is oriented to person, place, and time. He appears well-developed.  HENT:  Head: Normocephalic.  Eyes: Conjunctivae and EOM are normal. No scleral icterus.  Neck: Neck supple. No thyromegaly present.  Cardiovascular: Normal rate and regular rhythm.  Exam reveals no gallop and no friction rub.   No murmur heard. Pulmonary/Chest: No stridor. He has no wheezes. He has no rales. He exhibits no tenderness.  Abdominal: He exhibits no distension. There is tenderness. There is no rebound.  Musculoskeletal: Normal range of motion. He exhibits no edema.  Lymphadenopathy:    He has no cervical adenopathy.  Neurological: He is oriented to  person, place, and time. He exhibits normal muscle tone. Coordination normal.  Skin: No rash noted. No erythema.  Psychiatric: He has a normal mood and affect. His behavior is normal.     ED Treatments / Results  Labs (all labs ordered are listed, but only abnormal results are displayed) Labs Reviewed  COMPREHENSIVE METABOLIC PANEL - Abnormal; Notable for the following:       Result Value   Sodium 126 (*)    Chloride 87 (*)    Creatinine, Ser 0.37 (*)    Alkaline Phosphatase 127 (*)    All other components within normal limits  CBC - Abnormal; Notable for the following:    RBC 3.93 (*)    HCT 38.3 (*)    MCH 34.4 (*)    All other components within normal limits  URINALYSIS, ROUTINE W REFLEX MICROSCOPIC - Abnormal; Notable for the following:  Glucose, UA 50 (*)    Protein, ur 30 (*)    All other components within normal limits  DIFFERENTIAL - Abnormal; Notable for the following:    Monocytes Absolute 1.4 (*)    All other components within normal limits  LIPASE, BLOOD    EKG  EKG Interpretation None       Radiology Ct Abdomen Pelvis W Contrast  Result Date: 06/18/2017 CLINICAL DATA:  Upper abdominal pain and vomiting. EXAM: CT ABDOMEN AND PELVIS WITH CONTRAST TECHNIQUE: Multidetector CT imaging of the abdomen and pelvis was performed using the standard protocol following bolus administration of intravenous contrast. CONTRAST:  162mL ISOVUE-300 IOPAMIDOL (ISOVUE-300) INJECTION 61% COMPARISON:  MRI 03/06/2017.  CT scan 03/09/2015 FINDINGS: Lower chest:  Unremarkable. Hepatobiliary: Small area of low attenuation in the anterior liver, adjacent to the falciform ligament, is in a characteristic location for focal fatty deposition. Liver otherwise unremarkable. There is no evidence for gallstones, gallbladder wall thickening, or pericholecystic fluid. No intrahepatic or extrahepatic biliary dilation. Pancreas: Diffuse dystrophic calcification is seen throughout the pancreatic  parenchyma with associated diffuse dilatation the main pancreatic duct in overall generalized pancreatic parenchymal atrophy. Imaging features are compatible with chronic pancreatitis and have progressed in the interval since the prior CT scan. Subtle peripancreatic edema is identified in the region the pancreatic head and uncinate process today compatible with acute on chronic pancreatic inflammatory change. Spleen: No splenomegaly. No focal mass lesion. Adrenals/Urinary Tract: No adrenal nodule or mass. Tiny low-density lesion lower pole left kidney is compatible with a cyst. Right kidney unremarkable. No evidence for hydroureter. The urinary bladder appears normal for the degree of distention. Stomach/Bowel: Stomach is nondistended. No gastric wall thickening. No evidence of outlet obstruction. Duodenum is normally positioned as is the ligament of Treitz. No small bowel wall thickening. No small bowel dilatation. The terminal ileum is normal. The appendix is normal. No gross colonic mass. No colonic wall thickening. No substantial diverticular change. Vascular/Lymphatic: There is abdominal aortic atherosclerosis without aneurysm. Stable upper normal lymph nodes identified hepatoduodenal ligament. No retroperitoneal lymphadenopathy. No pelvic sidewall lymphadenopathy. Reproductive: Prostate gland is enlarged. Other: No intraperitoneal free fluid. Musculoskeletal: Bone windows reveal no worrisome lytic or sclerotic osseous lesions. IMPRESSION: 1. Peripancreatic edema/inflammation identified in the region of the pancreatic head/uncinate process with diffuse pancreatic parenchymal atrophy, ductal dilatation and diffuse dystrophic calcification of the pancreatic parenchyma. Imaging features compatible with acute pancreatitis in the region of the pancreatic head superimposed on sequelae of chronic inflammatory change. 2.  Aortic Atherosclerois (ICD10-170.0) Electronically Signed   By: Misty Stanley M.D.   On:  06/18/2017 12:49    Procedures Procedures (including critical care time)  Medications Ordered in ED Medications  sodium chloride 0.9 % bolus 2,000 mL (2,000 mLs Intravenous New Bag/Given 06/18/17 1118)  pantoprazole (PROTONIX) injection 40 mg (40 mg Intravenous Given 06/18/17 1118)  HYDROmorphone (DILAUDID) injection 1 mg (1 mg Intravenous Given 06/18/17 1118)  ondansetron (ZOFRAN) injection 4 mg (4 mg Intravenous Given 06/18/17 1118)  iopamidol (ISOVUE-300) 61 % injection 100 mL (100 mLs Intravenous Contrast Given 06/18/17 1224)  HYDROmorphone (DILAUDID) injection 1 mg (1 mg Intravenous Given 06/18/17 1314)  ondansetron (ZOFRAN) injection 4 mg (4 mg Intravenous Given 06/18/17 1314)     Initial Impression / Assessment and Plan / ED Course  I have reviewed the triage vital signs and the nursing notes.  Pertinent labs & imaging results that were available during my care of the patient were reviewed by me and considered in my  medical decision making (see chart for details).     Patient with pancreatitis. I suggested we admit him to rest his pancreas and treat his pain. Patient did not want to be admitted to the hospital. He is given a prescription for Dilaudid and Zofran told to drink liquids only and to follow-up with his doctor in 2-3 days  Final Clinical Impressions(s) / ED Diagnoses   Final diagnoses:  Alcohol-induced acute pancreatitis, unspecified complication status    New Prescriptions New Prescriptions   HYDROMORPHONE (DILAUDID) 4 MG TABLET    Take 1 tablet (4 mg total) by mouth every 6 (six) hours as needed for severe pain.   ONDANSETRON (ZOFRAN ODT) 4 MG DISINTEGRATING TABLET    4mg  ODT q4 hours prn nausea/vomit     Milton Ferguson, MD 06/18/17 1329

## 2017-06-18 NOTE — Discharge Instructions (Signed)
Drink only liquids for the next couple days. Follow-up with her doctor in 2-3 days. We returned to the emergency department if not improving

## 2017-06-18 NOTE — ED Triage Notes (Signed)
Abdominal pain and vomiting times 5-6 days.

## 2017-06-23 ENCOUNTER — Emergency Department (HOSPITAL_COMMUNITY): Payer: Medicaid Other

## 2017-06-23 ENCOUNTER — Encounter (HOSPITAL_COMMUNITY): Payer: Self-pay | Admitting: *Deleted

## 2017-06-23 ENCOUNTER — Emergency Department (HOSPITAL_COMMUNITY)
Admission: EM | Admit: 2017-06-23 | Discharge: 2017-06-23 | Disposition: A | Discharge status: Home / Self Care | Payer: Medicaid Other | Attending: Emergency Medicine | Admitting: Emergency Medicine

## 2017-06-23 DIAGNOSIS — K860 Alcohol-induced chronic pancreatitis: Secondary | ICD-10-CM

## 2017-06-23 DIAGNOSIS — F17210 Nicotine dependence, cigarettes, uncomplicated: Secondary | ICD-10-CM | POA: Diagnosis not present

## 2017-06-23 DIAGNOSIS — R1084 Generalized abdominal pain: Secondary | ICD-10-CM | POA: Diagnosis present

## 2017-06-23 DIAGNOSIS — I10 Essential (primary) hypertension: Secondary | ICD-10-CM | POA: Insufficient documentation

## 2017-06-23 DIAGNOSIS — Z79899 Other long term (current) drug therapy: Secondary | ICD-10-CM | POA: Diagnosis not present

## 2017-06-23 DIAGNOSIS — J449 Chronic obstructive pulmonary disease, unspecified: Secondary | ICD-10-CM | POA: Diagnosis not present

## 2017-06-23 DIAGNOSIS — K861 Other chronic pancreatitis: Secondary | ICD-10-CM | POA: Diagnosis not present

## 2017-06-23 LAB — CBC WITH DIFFERENTIAL/PLATELET
BASOS ABS: 0 10*3/uL (ref 0.0–0.1)
BASOS PCT: 0 %
EOS PCT: 2 %
Eosinophils Absolute: 0.2 10*3/uL (ref 0.0–0.7)
HCT: 35.8 % — ABNORMAL LOW (ref 39.0–52.0)
Hemoglobin: 12.6 g/dL — ABNORMAL LOW (ref 13.0–17.0)
LYMPHS ABS: 3 10*3/uL (ref 0.7–4.0)
Lymphocytes Relative: 27 %
MCH: 34.8 pg — ABNORMAL HIGH (ref 26.0–34.0)
MCHC: 35.2 g/dL (ref 30.0–36.0)
MCV: 98.9 fL (ref 78.0–100.0)
MONOS PCT: 11 %
Monocytes Absolute: 1.2 10*3/uL — ABNORMAL HIGH (ref 0.1–1.0)
NEUTROS PCT: 60 %
Neutro Abs: 6.5 10*3/uL (ref 1.7–7.7)
PLATELETS: 305 10*3/uL (ref 150–400)
RBC: 3.62 MIL/uL — AB (ref 4.22–5.81)
RDW: 12.3 % (ref 11.5–15.5)
WBC: 10.9 10*3/uL — ABNORMAL HIGH (ref 4.0–10.5)

## 2017-06-23 LAB — COMPREHENSIVE METABOLIC PANEL
ALBUMIN: 3.8 g/dL (ref 3.5–5.0)
ALT: 16 U/L — AB (ref 17–63)
AST: 31 U/L (ref 15–41)
Alkaline Phosphatase: 111 U/L (ref 38–126)
Anion gap: 11 (ref 5–15)
BUN: <5 mg/dL — ABNORMAL LOW (ref 6–20)
CHLORIDE: 99 mmol/L — AB (ref 101–111)
CO2: 23 mmol/L (ref 22–32)
CREATININE: 0.38 mg/dL — AB (ref 0.61–1.24)
Calcium: 8.6 mg/dL — ABNORMAL LOW (ref 8.9–10.3)
GFR calc non Af Amer: >60 mL/min (ref >60)
Glucose, Bld: 113 mg/dL — ABNORMAL HIGH (ref 65–99)
Potassium: 4.5 mmol/L (ref 3.5–5.1)
SODIUM: 133 mmol/L — AB (ref 135–145)
Total Bilirubin: 0.6 mg/dL (ref 0.3–1.2)
Total Protein: 7.6 g/dL (ref 6.5–8.1)

## 2017-06-23 LAB — LIPASE, BLOOD: Lipase: 46 U/L (ref 11–51)

## 2017-06-23 MED ORDER — HYDROMORPHONE HCL 1 MG/ML IJ SOLN
1.0000 mg | Freq: Once | INTRAMUSCULAR | Status: AC
  Administered 2017-06-23: 1 mg via INTRAVENOUS
  Filled 2017-06-23: qty 1

## 2017-06-23 MED ORDER — SODIUM CHLORIDE 0.9 % IV BOLUS (SEPSIS)
1000.0000 mL | Freq: Once | INTRAVENOUS | Status: AC
  Administered 2017-06-23: 1000 mL via INTRAVENOUS

## 2017-06-23 NOTE — ED Provider Notes (Addendum)
[facility] DEPT Provider Note   CSN: [id number] Arrival date & time: 06/23/17  1914     History   Chief Complaint Chief Complaint  Patient presents with  . Abdominal Pain    HPI [name] is a 41 y.o. male.Complains of epigastric abdominal pain onset 2 days ago, gradually which is typical of pancreatitis she's had in the past. He was seen here on 06/18/2017 for same complaint,CT scan showed evidence of acute pancreatitis admission offered which he declined. Prescribed hydromorphone and Zofran. He's had no vomiting. He does have scheduled follow-up appointment with his primary care physician tomorrow. He denies any fever denies nausea or vomiting. He did drink alcohol earlier this morning. Nothing makes symptoms better or worse.  HPI  Past Medical History:  Diagnosis Date  . Back pain   . Bradycardia 08/17/2011  . COPD (chronic obstructive pulmonary disease) ([place])   . ETOH abuse   . GERD (gastroesophageal reflux disease)   . Macrocytosis 08/17/2011  . Pancreatitis   . Pneumonia   . SAH (subarachnoid hemorrhage) ([place])   . Seizures [facility])     Patient Active Problem List   Diagnosis Date Noted  . Alcohol abuse   . Atypical chest pain   . Hypercalcemia 10/08/2016  . Hypokalemia 10/08/2016  . Diarrhea 10/09/2015  . Pancreatitis, alcoholic, acute   . Alcohol-induced chronic pancreatitis ([place])   . Acute pancreatitis   . Pancreatitis 12/26/2014  . Pancreatic pseudocyst 12/26/2014  . Hyponatremia 12/26/2014  . HTN (hypertension) 07/27/2013  . Abdominal pain 07/11/2012  . Macrocytosis 08/17/2011  . Marijuana abuse 08/17/2011  . Bradycardia 08/17/2011  . Alcohol withdrawal ([place]) 08/17/2011  . Elevated blood pressure 07/08/2011  . Left against medical advice 07/08/2011  . Acute alcoholic pancreatitis 40/98/1191  . Alcohol abuse, continuous 07/07/2011  . Tobacco abuse 07/07/2011  . Seizure disorder ([place]) 07/07/2011  . Adynamic ileus ([place]) 07/07/2011  . Wheezing  07/07/2011  . COPD (chronic obstructive pulmonary disease) ([place]) 07/07/2011    Past Surgical History:  Procedure Laterality Date  . BACK SURGERY    . SEPTOPLASTY         Home Medications    Prior to Admission medications   Medication Sig Start Date End Date Taking? Authorizing Provider  albuterol (PROVENTIL HFA;VENTOLIN HFA) 108 (90 BASE) MCG/ACT inhaler Inhale 2 puffs into the lungs every 6 (six) hours as needed for wheezing or shortness of breath.    [provider]  albuterol (PROVENTIL) (2.5 MG/3ML) 0.083% nebulizer solution Take 3 mLs (2.5 mg total) by nebulization every 4 (four) hours as needed for wheezing or shortness of breath. 09/09/13   Orpah Greek, MD  carbamazepine (TEGRETOL) 200 MG tablet Take 400 mg by mouth 2 (two) times daily.    [provider]  HYDROmorphone (DILAUDID) 4 MG tablet Take 1 tablet (4 mg total) by mouth every 6 (six) hours as needed for severe pain. 06/18/17   Milton Ferguson, MD  ibuprofen (ADVIL,MOTRIN) 800 MG tablet Take 1 tablet (800 mg total) by mouth 3 (three) times daily. 01/23/17   Orpah Greek, MD  levETIRAcetam (KEPPRA) 500 MG tablet Take 500 mg by mouth 2 (two) times daily.     [provider]  lisinopril (PRINIVIL,ZESTRIL) 20 MG tablet Take 1 tablet (20 mg total) by mouth daily. 10/10/16   Rosita Fire, MD  Multiple Vitamins-Minerals (MULTIVITAMIN ADULTS) TABS Take 1 tablet by mouth daily.    [provider]  ondansetron (ZOFRAN ODT) 4 MG disintegrating  tablet 4mg  ODT q4 hours prn nausea/vomit 06/18/17   Milton Ferguson, MD  oxyCODONE-acetaminophen (PERCOCET) 10-325 MG per tablet Take 1 tablet by mouth every 6 (six) hours as needed for pain.     [provider]    Family History Family History  Problem Relation Age of Onset  . Coronary artery disease Mother        deceased age 19  . Seizures Father   . Alcohol abuse Father   . Colon cancer Neg Hx     Social  History Social History  Substance Use Topics  . Smoking status: Current Every Day Smoker    Packs/day: 1.00    Years: 20.00    Types: Cigarettes  . Smokeless tobacco: Never Used  . Alcohol use 25.2 oz/week    42 Cans of beer per week     Comment: 4-6 beers a day   Denies illicit drug Allergies   Patient has no known allergies.   Review of Systems Review of Systems  Constitutional: Negative.   HENT: Negative.   Respiratory: Negative.   Cardiovascular: Negative.   Gastrointestinal: Positive for abdominal pain.  Musculoskeletal: Negative.   Skin: Negative.   Neurological: Negative.   Psychiatric/Behavioral: Negative.   All other systems reviewed and are negative.    Physical Exam Updated Vital Signs BP (!) 164/105 (BP Location: Left Arm)   Pulse 85   Temp 98.2 F (36.8 C) (Oral)   Resp 18   Ht 5\' 9"  (1.753 m)   Wt 54.9 kg (121 lb)   SpO2 98%   BMI 17.87 kg/m   Physical Exam  Constitutional: He appears well-developed and well-nourished.  HENT:  Head: Normocephalic and atraumatic.  Eyes: Pupils are equal, round, and reactive to light. Conjunctivae are normal.  Neck: Neck supple. No tracheal deviation present. No thyromegaly present.  Cardiovascular: Normal rate and regular rhythm.   No murmur heard. Pulmonary/Chest: Effort normal and breath sounds normal.  Abdominal: Soft. Bowel sounds are normal. He exhibits no distension. There is tenderness.  Epigastric tenderness  Genitourinary: Penis normal.  Genitourinary Comments: scrotum normal  Musculoskeletal: Normal range of motion. He exhibits no edema or tenderness.  Neurological: He is alert. Coordination normal.  Skin: Skin is warm and dry. No rash noted.  Psychiatric: He has a normal mood and affect.  Nursing note and vitals reviewed.    ED Treatments / Results  Labs (all labs ordered are listed, but only abnormal results are displayed) Labs Reviewed  COMPREHENSIVE METABOLIC PANEL  CBC WITH  DIFFERENTIAL/PLATELET  LIPASE, BLOOD    EKG  EKG Interpretation None       Radiology No results found.  Procedures Procedures (including critical care time)  Medications Ordered in ED Medications  sodium chloride 0.9 % bolus 1,000 mL (not administered)     Results for orders placed or performed during the hospital encounter of 06/23/17  Comprehensive metabolic panel  Result Value Ref Range   Sodium 133 (L) 135 - 145 mmol/L   Potassium 4.5 3.5 - 5.1 mmol/L   Chloride 99 (L) 101 - 111 mmol/L   CO2 23 22 - 32 mmol/L   Glucose, Bld 113 (H) 65 - 99 mg/dL   BUN <5 (L) 6 - 20 mg/dL   Creatinine, Ser 0.38 (L) 0.61 - 1.24 mg/dL   Calcium 8.6 (L) 8.9 - 10.3 mg/dL   Total Protein 7.6 6.5 - 8.1 g/dL   Albumin 3.8 3.5 - 5.0 g/dL   AST 31 15 - 41  U/L   ALT 16 (L) 17 - 63 U/L   Alkaline Phosphatase 111 38 - 126 U/L   Total Bilirubin 0.6 0.3 - 1.2 mg/dL   GFR calc non Af Amer >60 >60 mL/min   GFR calc Af Amer >60 >60 mL/min   Anion gap 11 5 - 15  CBC with Differential/Platelet  Result Value Ref Range   WBC 10.9 (H) 4.0 - 10.5 K/uL   RBC 3.62 (L) 4.22 - 5.81 MIL/uL   Hemoglobin 12.6 (L) 13.0 - 17.0 g/dL   HCT 35.8 (L) 39.0 - 52.0 %   MCV 98.9 78.0 - 100.0 fL   MCH 34.8 (H) 26.0 - 34.0 pg   MCHC 35.2 30.0 - 36.0 g/dL   RDW 12.3 11.5 - 15.5 %   Platelets 305 150 - 400 K/uL   Neutrophils Relative % 60 %   Neutro Abs 6.5 1.7 - 7.7 K/uL   Lymphocytes Relative 27 %   Lymphs Abs 3.0 0.7 - 4.0 K/uL   Monocytes Relative 11 %   Monocytes Absolute 1.2 (H) 0.1 - 1.0 K/uL   Eosinophils Relative 2 %   Eosinophils Absolute 0.2 0.0 - 0.7 K/uL   Basophils Relative 0 %   Basophils Absolute 0.0 0.0 - 0.1 K/uL  Lipase, blood  Result Value Ref Range   Lipase 46 11 - 51 U/L   Ct Abdomen Pelvis W Contrast  Result Date: 06/18/2017 CLINICAL DATA:  Upper abdominal pain and vomiting. EXAM: CT ABDOMEN AND PELVIS WITH CONTRAST TECHNIQUE: Multidetector CT imaging of the abdomen and pelvis was  performed using the standard protocol following bolus administration of intravenous contrast. CONTRAST:  141mL ISOVUE-300 IOPAMIDOL (ISOVUE-300) INJECTION 61% COMPARISON:  MRI 03/06/2017.  CT scan 03/09/2015 FINDINGS: Lower chest:  Unremarkable. Hepatobiliary: Small area of low attenuation in the anterior liver, adjacent to the falciform ligament, is in a characteristic location for focal fatty deposition. Liver otherwise unremarkable. There is no evidence for gallstones, gallbladder wall thickening, or pericholecystic fluid. No intrahepatic or extrahepatic biliary dilation. Pancreas: Diffuse dystrophic calcification is seen throughout the pancreatic parenchyma with associated diffuse dilatation the main pancreatic duct in overall generalized pancreatic parenchymal atrophy. Imaging features are compatible with chronic pancreatitis and have progressed in the interval since the prior CT scan. Subtle peripancreatic edema is identified in the region the pancreatic head and uncinate process today compatible with acute on chronic pancreatic inflammatory change. Spleen: No splenomegaly. No focal mass lesion. Adrenals/Urinary Tract: No adrenal nodule or mass. Tiny low-density lesion lower pole left kidney is compatible with a cyst. Right kidney unremarkable. No evidence for hydroureter. The urinary bladder appears normal for the degree of distention. Stomach/Bowel: Stomach is nondistended. No gastric wall thickening. No evidence of outlet obstruction. Duodenum is normally positioned as is the ligament of Treitz. No small bowel wall thickening. No small bowel dilatation. The terminal ileum is normal. The appendix is normal. No gross colonic mass. No colonic wall thickening. No substantial diverticular change. Vascular/Lymphatic: There is abdominal aortic atherosclerosis without aneurysm. Stable upper normal lymph nodes identified hepatoduodenal ligament. No retroperitoneal lymphadenopathy. No pelvic sidewall lymphadenopathy.  Reproductive: Prostate gland is enlarged. Other: No intraperitoneal free fluid. Musculoskeletal: Bone windows reveal no worrisome lytic or sclerotic osseous lesions. IMPRESSION: 1. Peripancreatic edema/inflammation identified in the region of the pancreatic head/uncinate process with diffuse pancreatic parenchymal atrophy, ductal dilatation and diffuse dystrophic calcification of the pancreatic parenchyma. Imaging features compatible with acute pancreatitis in the region of the pancreatic head superimposed on sequelae of chronic  inflammatory change. 2.  Aortic Atherosclerois (ICD10-170.0) Electronically Signed   By: Misty Stanley M.D.   On: 06/18/2017 12:49   Dg Abd Acute W/chest  Result Date: 06/23/2017 CLINICAL DATA:  Continued abdominal pain for 4 days. History of pancreatitis and alcohol abuse. EXAM: DG ABDOMEN ACUTE W/ 1V CHEST COMPARISON:  CT abdomen and pelvis May 29, 2017 inch chest radiograph October 08, 2016 FINDINGS: Cardiomediastinal silhouette is normal. Stable biapical scarring. Mild bronchitic changes and mild chronic interstitial changes. No pneumothorax. Soft tissue planes and included osseous structures are nonacute. Multiple old thoracic compression fractures. Bowel gas pattern is nondilated and nonobstructive. Re- demonstration of pancreatic calcifications. Aortoiliac vascular calcifications. No intra-abdominal mass effect, or free air. Soft tissue planes and included osseous structures are non-suspicious. IMPRESSION: 1. Stable apical scarring and COPD. 2. Pancreatic calcifications seen with chronic pancreatitis. 3. Normal bowel gas pattern. Aortic Atherosclerosis (ICD10-I70.0). Electronically Signed   By: Elon Alas M.D.   On: 06/23/2017 21:11    Initial Impression / Assessment and Plan / ED Course  I have reviewed the triage vital signs and the nursing notes.  Pertinent labs & imaging results that were available during my care of the patient were reviewed by me and  considered in my medical decision making (see chart for details).     9:45 PM pain improved after treatment with intravenous hydromorphone and intravenous hydration. He feels ready to go home. He is able to drink Sprite without difficulty He is instructed to avoid alcohol and keep his scheduled plan with his primary care physician tomorrow Final Clinical Impressions(s) / ED Diagnoses  Diagnosis chronic alcoholicpancreatitis Final diagnoses:  None    New Prescriptions New Prescriptions   No medications on file     Orlie Dakin, MD 06/23/17 [place], [place], MD 06/23/17 2204

## 2017-06-23 NOTE — Discharge Instructions (Signed)
Keep your scheduled appointment with her primary care physician tomorrow. Avoid any beverages that contain alcohol to include beer, wine, or hard liquor.

## 2017-06-23 NOTE — ED Notes (Signed)
Pt reports that he is to see doctor in am- wonders aloud if he will be sent home w pain eds  When asked states he has pain meds at home

## 2017-06-23 NOTE — ED Notes (Signed)
From radiology 

## 2017-06-23 NOTE — ED Notes (Signed)
Pt seen here four days ago Was told to drink only liquids which he did-  Drank alcoholic beverage this am and not complains of pain around his pancreas  When asked longest time clean pt replies "days"  He is thin, smells of cigarettes  IV established, spec to lab

## 2017-06-23 NOTE — ED Notes (Signed)
Patient transported to X-ray 

## 2017-06-23 NOTE — ED Triage Notes (Addendum)
Pt reports continued abdominal pain x 4 days. Pt states he has a hx of pancreatitis. Denies n/v/d

## 2017-06-23 NOTE — ED Notes (Signed)
Pt given a diet sprite to go Had requested a couple of drinks

## 2017-06-23 NOTE — ED Notes (Signed)
Pt requested a couple of sprites-

## 2017-07-02 ENCOUNTER — Encounter: Payer: Self-pay | Admitting: Internal Medicine

## 2017-08-07 ENCOUNTER — Emergency Department (HOSPITAL_COMMUNITY): Payer: Medicaid Other

## 2017-08-07 ENCOUNTER — Encounter (HOSPITAL_COMMUNITY): Payer: Self-pay

## 2017-08-07 ENCOUNTER — Emergency Department (HOSPITAL_COMMUNITY)
Admission: EM | Admit: 2017-08-07 | Discharge: 2017-08-07 | Disposition: A | Discharge status: Home / Self Care | Payer: Medicaid Other | Attending: Emergency Medicine | Admitting: Emergency Medicine

## 2017-08-07 ENCOUNTER — Other Ambulatory Visit: Payer: Self-pay

## 2017-08-07 DIAGNOSIS — F1020 Alcohol dependence, uncomplicated: Secondary | ICD-10-CM | POA: Diagnosis not present

## 2017-08-07 DIAGNOSIS — I10 Essential (primary) hypertension: Secondary | ICD-10-CM | POA: Diagnosis not present

## 2017-08-07 DIAGNOSIS — R1013 Epigastric pain: Secondary | ICD-10-CM | POA: Diagnosis present

## 2017-08-07 DIAGNOSIS — F17210 Nicotine dependence, cigarettes, uncomplicated: Secondary | ICD-10-CM | POA: Insufficient documentation

## 2017-08-07 DIAGNOSIS — K860 Alcohol-induced chronic pancreatitis: Secondary | ICD-10-CM

## 2017-08-07 DIAGNOSIS — Z79899 Other long term (current) drug therapy: Secondary | ICD-10-CM | POA: Insufficient documentation

## 2017-08-07 DIAGNOSIS — R079 Chest pain, unspecified: Secondary | ICD-10-CM | POA: Diagnosis not present

## 2017-08-07 DIAGNOSIS — E871 Hypo-osmolality and hyponatremia: Secondary | ICD-10-CM

## 2017-08-07 DIAGNOSIS — J449 Chronic obstructive pulmonary disease, unspecified: Secondary | ICD-10-CM | POA: Diagnosis not present

## 2017-08-07 LAB — LIPASE, BLOOD: Lipase: 19 U/L (ref 11–51)

## 2017-08-07 LAB — CBC WITH DIFFERENTIAL/PLATELET
BASOS PCT: 0 %
Basophils Absolute: 0 10*3/uL (ref 0.0–0.1)
EOS ABS: 0.1 10*3/uL (ref 0.0–0.7)
EOS PCT: 1 %
HCT: 39 % (ref 39.0–52.0)
Hemoglobin: 13.7 g/dL (ref 13.0–17.0)
LYMPHS ABS: 2.3 10*3/uL (ref 0.7–4.0)
Lymphocytes Relative: 27 %
MCH: 34.3 pg — AB (ref 26.0–34.0)
MCHC: 35.1 g/dL (ref 30.0–36.0)
MCV: 97.7 fL (ref 78.0–100.0)
MONOS PCT: 10 %
Monocytes Absolute: 0.9 10*3/uL (ref 0.1–1.0)
Neutro Abs: 5.1 10*3/uL (ref 1.7–7.7)
Neutrophils Relative %: 62 %
PLATELETS: 388 10*3/uL (ref 150–400)
RBC: 3.99 MIL/uL — AB (ref 4.22–5.81)
RDW: 11.8 % (ref 11.5–15.5)
WBC: 8.4 10*3/uL (ref 4.0–10.5)

## 2017-08-07 LAB — COMPREHENSIVE METABOLIC PANEL
ALT: 16 U/L — ABNORMAL LOW (ref 17–63)
ANION GAP: 12 (ref 5–15)
AST: 27 U/L (ref 15–41)
Albumin: 4.1 g/dL (ref 3.5–5.0)
Alkaline Phosphatase: 142 U/L — ABNORMAL HIGH (ref 38–126)
BUN: <5 mg/dL — ABNORMAL LOW (ref 6–20)
CHLORIDE: 87 mmol/L — AB (ref 101–111)
CO2: 22 mmol/L (ref 22–32)
Calcium: 9 mg/dL (ref 8.9–10.3)
Creatinine, Ser: 0.31 mg/dL — ABNORMAL LOW (ref 0.61–1.24)
GFR calc non Af Amer: >60 mL/min (ref >60)
Glucose, Bld: 130 mg/dL — ABNORMAL HIGH (ref 65–99)
POTASSIUM: 3.6 mmol/L (ref 3.5–5.1)
SODIUM: 121 mmol/L — AB (ref 135–145)
TOTAL PROTEIN: 8.2 g/dL — AB (ref 6.5–8.1)
Total Bilirubin: 0.3 mg/dL (ref 0.3–1.2)

## 2017-08-07 LAB — ETHANOL: Alcohol, Ethyl (B): 61 mg/dL — ABNORMAL HIGH (ref <10)

## 2017-08-07 LAB — TROPONIN I

## 2017-08-07 MED ORDER — ONDANSETRON 8 MG PO TBDP: 8.0000 mg | ORAL_TABLET | Freq: Three times a day (TID) | ORAL | 0 refills | Status: DC | PRN

## 2017-08-07 MED ORDER — HYDROMORPHONE HCL 1 MG/ML IJ SOLN
1.0000 mg | Freq: Once | INTRAMUSCULAR | Status: AC
Start: 2017-08-07 — End: 2017-08-07
  Administered 2017-08-07: 1 mg via INTRAVENOUS
  Filled 2017-08-07: qty 1

## 2017-08-07 MED ORDER — HYDROMORPHONE HCL 1 MG/ML IJ SOLN
1.0000 mg | Freq: Once | INTRAMUSCULAR | Status: AC
  Administered 2017-08-07: 1 mg via INTRAVENOUS
  Filled 2017-08-07: qty 1

## 2017-08-07 MED ORDER — TRAMADOL HCL 50 MG PO TABS: 50.0000 mg | ORAL_TABLET | Freq: Four times a day (QID) | ORAL | 0 refills | Status: DC | PRN

## 2017-08-07 MED ORDER — HYDROMORPHONE HCL 2 MG PO TABS
2.0000 mg | ORAL_TABLET | Freq: Once | ORAL | Status: AC
  Administered 2017-08-07: 2 mg via ORAL
  Filled 2017-08-07: qty 1

## 2017-08-07 NOTE — Discharge Instructions (Signed)
Avoid drinking any beer or alcohol.  Try a liquid bland diet initially.  Advance to a regular diet as tolerated.

## 2017-08-07 NOTE — ED Provider Notes (Signed)
[place] DEPT Provider Note   CSN: [id number] Arrival date & time: 08/07/17  1737     History   Chief Complaint Chief Complaint  Patient presents with  . Abdominal Pain  . Chest Pain    HPI [name] is a 41 y.o. male.  HPI The patient has a history of alcohol abuse and pancreatitis. Patient states he started having trouble with abdominal pain and poor appetite several days ago. He was able to drink beer still had a few beers last night. His symptoms have progressed and become more severe so he came to the emergency room today. Pain is in his upper abdomen but he is also having pain in the left side of his chest. He has been coughing and he smokes. He denies any fevers or chills. No dysuria. Past Medical History:  Diagnosis Date  . Back pain   . Bradycardia 08/17/2011  . COPD (chronic obstructive pulmonary disease) ([place])   . ETOH abuse   . GERD (gastroesophageal reflux disease)   . Macrocytosis 08/17/2011  . Pancreatitis   . Pneumonia   . SAH (subarachnoid hemorrhage) ([place])   . Seizures [facility])     Patient Active Problem List   Diagnosis Date Noted  . Alcohol abuse   . Atypical chest pain   . Hypercalcemia 10/08/2016  . Hypokalemia 10/08/2016  . Diarrhea 10/09/2015  . Pancreatitis, alcoholic, acute   . Alcohol-induced chronic pancreatitis ([place])   . Acute pancreatitis   . Pancreatitis 12/26/2014  . Pancreatic pseudocyst 12/26/2014  . Hyponatremia 12/26/2014  . HTN (hypertension) 07/27/2013  . Abdominal pain 07/11/2012  . Macrocytosis 08/17/2011  . Marijuana abuse 08/17/2011  . Bradycardia 08/17/2011  . Alcohol withdrawal ([place]) 08/17/2011  . Elevated blood pressure 07/08/2011  . Left against medical advice 07/08/2011  . Acute alcoholic pancreatitis 44/10/270  . Alcohol abuse, continuous 07/07/2011  . Tobacco abuse 07/07/2011  . Seizure disorder ([place]) 07/07/2011  . Adynamic ileus ([place]) 07/07/2011  . Wheezing 07/07/2011  . COPD (chronic  obstructive pulmonary disease) ([place]) 07/07/2011    Past Surgical History:  Procedure Laterality Date  . BACK SURGERY    . SEPTOPLASTY         Home Medications    Prior to Admission medications   Medication Sig Start Date End Date Taking? Authorizing Provider  albuterol (PROVENTIL HFA;VENTOLIN HFA) 108 (90 BASE) MCG/ACT inhaler Inhale 2 puffs into the lungs every 6 (six) hours as needed for wheezing or shortness of breath.   Yes [provider]  albuterol (PROVENTIL) (2.5 MG/3ML) 0.083% nebulizer solution Take 3 mLs (2.5 mg total) by nebulization every 4 (four) hours as needed for wheezing or shortness of breath. 09/09/13  Yes Pollina, Gwenyth Allegra, MD  carbamazepine (TEGRETOL) 200 MG tablet Take 400 mg by mouth 2 (two) times daily.   Yes [provider]  levETIRAcetam (KEPPRA) 500 MG tablet Take 500 mg by mouth 2 (two) times daily.    Yes [provider]  lisinopril (PRINIVIL,ZESTRIL) 20 MG tablet Take 1 tablet (20 mg total) by mouth daily. 10/10/16  Yes Rosita Fire, MD  Multiple Vitamins-Minerals (MULTIVITAMIN ADULTS) TABS Take 1 tablet by mouth daily.   Yes [provider]  Oxycodone HCl 10 MG TABS Take 10 mg by mouth 2 (two) times daily.   Yes [provider]  HYDROmorphone (DILAUDID) 4 MG tablet Take 1 tablet (4 mg total) by mouth every 6 (six) hours as needed for severe pain. Patient not taking: Reported on 08/07/2017  06/18/17   Milton Ferguson, MD  ondansetron (ZOFRAN ODT) 8 MG disintegrating tablet Take 1 tablet (8 mg total) by mouth every 8 (eight) hours as needed for nausea or vomiting. 08/07/17   Dorie Rank, MD  traMADol (ULTRAM) 50 MG tablet Take 1 tablet (50 mg total) by mouth every 6 (six) hours as needed. 08/07/17   Dorie Rank, MD    Family History Family History  Problem Relation Age of Onset  . Coronary artery disease Mother        deceased age 43  . Seizures Father   . Alcohol abuse Father   . Colon cancer Neg  Hx     Social History Social History  Substance Use Topics  . Smoking status: Current Every Day Smoker    Packs/day: 1.00    Years: 20.00    Types: Cigarettes  . Smokeless tobacco: Never Used  . Alcohol use 25.2 oz/week    42 Cans of beer per week     Comment: 4-6 beers a day      Allergies   Patient has no known allergies.   Review of Systems Review of Systems  All other systems reviewed and are negative.    Physical Exam Updated Vital Signs BP 132/78   Pulse 68   Temp 97.8 F (36.6 C) (Oral)   Resp (!) 9   Ht 1.753 m (5\' 9" )   Wt 55.3 kg (122 lb)   SpO2 93%   BMI 18.02 kg/m   Physical Exam  Constitutional: No distress.  HENT:  Head: Normocephalic and atraumatic.  Right Ear: External ear normal.  Left Ear: External ear normal.  Eyes: Conjunctivae are normal. Right eye exhibits no discharge. Left eye exhibits no discharge. No scleral icterus.  Neck: Neck supple. No tracheal deviation present.  Cardiovascular: Normal rate, regular rhythm and intact distal pulses.   Pulmonary/Chest: Effort normal and breath sounds normal. No stridor. No respiratory distress. He has no wheezes. He has no rales.  Abdominal: Soft. Bowel sounds are normal. He exhibits no distension. There is tenderness in the epigastric area. There is no rebound and no guarding.  Musculoskeletal: He exhibits no edema or tenderness.  Neurological: He is alert. He has normal strength. No cranial nerve deficit (no facial droop, extraocular movements intact, no slurred speech) or sensory deficit. He exhibits normal muscle tone. He displays no seizure activity. Coordination normal.  Skin: Skin is warm and dry. No rash noted. He is not diaphoretic.  Psychiatric: He has a normal mood and affect.  Nursing note and vitals reviewed.    ED Treatments / Results  Labs (all labs ordered are listed, but only abnormal results are displayed) Labs Reviewed  CBC WITH DIFFERENTIAL/PLATELET - Abnormal; Notable for  the following:       Result Value   RBC 3.99 (*)    MCH 34.3 (*)    All other components within normal limits  COMPREHENSIVE METABOLIC PANEL - Abnormal; Notable for the following:    Sodium 121 (*)    Chloride 87 (*)    Glucose, Bld 130 (*)    BUN <5 (*)    Creatinine, Ser 0.31 (*)    Total Protein 8.2 (*)    ALT 16 (*)    Alkaline Phosphatase 142 (*)    All other components within normal limits  ETHANOL - Abnormal; Notable for the following:    Alcohol, Ethyl (B) 61 (*)    All other components within normal limits  TROPONIN I  LIPASE, BLOOD    Radiology Dg Chest 2 View  Result Date: 08/07/2017 CLINICAL DATA:  41 year old male with a history of left-sided chest pain EXAM: CHEST  2 VIEW COMPARISON:  06/23/2017 10/08/2016 FINDINGS: Cardiomediastinal silhouette unchanged. No evidence of central vascular congestion. No pneumothorax. No pleural effusion. No confluent airspace disease. Coarsened interstitial markings again demonstrated. Apical scarring/ chronic changes. No acute fracture. Similar configuration the vertebral bodies to the plain film 10/08/2016 IMPRESSION: Chronic lung changes without evidence of acute cardiopulmonary disease Electronically Signed   By: Corrie Mckusick D.O.   On: 08/07/2017 20:40    Procedures Procedures (including critical care time)  Medications Ordered in ED Medications  HYDROmorphone (DILAUDID) injection 1 mg (1 mg Intravenous Given 08/07/17 2015)  HYDROmorphone (DILAUDID) injection 1 mg (1 mg Intravenous Given 08/07/17 2136)     Initial Impression / Assessment and Plan / ED Course  I have reviewed the triage vital signs and the nursing notes.  Pertinent labs & imaging results that were available during my care of the patient were reviewed by me and considered in my medical decision making (see chart for details).  Clinical Course as of Aug 07 2142  Wed Aug 07, 2017  2134 Reviewed [facility] database.  Pt received 14 day supply of 10 mg oxycodone on  10/2  [JK]  2136 Hyponatremia noted on prior labs.  Doubt clinically significant.  [JK]    Clinical Course User Index [JK] Dorie Rank, MD  patient presents to the emergency room for evaluation of persistent abdominal pain. Denies any trouble with vomiting or diarrhea. Patient has a history of alcohol-induced pancreatitis. His lipase is normal today however I reviewed prior records and the patient has had evidence of inflammation consistent with pancreatitis on prior CT scans when his lipase has been normal.  I suspect the patient is having a recurrent episode of pancreatitis. Explained to him the importance of not drinking any alcohol including beer.  Patient's continues to drink beer causing recurrent episodes of his pancreatitis. He otherwise appears stable. Plan on discharge home with medications for pain. Liquid non alcoholic  diet. And follow-up with his primary care doctor.  Final Clinical Impressions(s) / ED Diagnoses   Final diagnoses:  Epigastric pain  Alcohol-induced chronic pancreatitis (HCC)  Hyponatremia    New Prescriptions New Prescriptions   ONDANSETRON (ZOFRAN ODT) 8 MG DISINTEGRATING TABLET    Take 1 tablet (8 mg total) by mouth every 8 (eight) hours as needed for nausea or vomiting.   TRAMADOL (ULTRAM) 50 MG TABLET    Take 1 tablet (50 mg total) by mouth every 6 (six) hours as needed.     Dorie Rank, MD 08/07/17 [phone]

## 2017-08-07 NOTE — ED Triage Notes (Signed)
Pt reports abd pain x 1 week and chest pain for the past 3 or 4 days.  Denies n/v/d.  Denies SOB.

## 2017-08-12 ENCOUNTER — Emergency Department (HOSPITAL_COMMUNITY): Payer: Medicaid Other

## 2017-08-12 ENCOUNTER — Encounter: Payer: Self-pay | Admitting: Gastroenterology

## 2017-08-12 ENCOUNTER — Other Ambulatory Visit: Payer: Self-pay

## 2017-08-12 ENCOUNTER — Encounter (HOSPITAL_COMMUNITY): Payer: Self-pay | Admitting: *Deleted

## 2017-08-12 ENCOUNTER — Ambulatory Visit (INDEPENDENT_AMBULATORY_CARE_PROVIDER_SITE_OTHER): Payer: Medicaid Other | Admitting: Gastroenterology

## 2017-08-12 ENCOUNTER — Emergency Department (HOSPITAL_COMMUNITY)
Admission: EM | Admit: 2017-08-12 | Discharge: 2017-08-12 | Disposition: A | Discharge status: Home / Self Care | Payer: Medicaid Other | Attending: Emergency Medicine | Admitting: Emergency Medicine

## 2017-08-12 VITALS — BP 146/64 | HR 78 | Temp 97.8°F | Ht 69.0 in | Wt 118.6 lb

## 2017-08-12 DIAGNOSIS — J449 Chronic obstructive pulmonary disease, unspecified: Secondary | ICD-10-CM | POA: Diagnosis not present

## 2017-08-12 DIAGNOSIS — R1010 Upper abdominal pain, unspecified: Secondary | ICD-10-CM | POA: Diagnosis present

## 2017-08-12 DIAGNOSIS — K253 Acute gastric ulcer without hemorrhage or perforation: Secondary | ICD-10-CM | POA: Diagnosis not present

## 2017-08-12 DIAGNOSIS — K860 Alcohol-induced chronic pancreatitis: Secondary | ICD-10-CM | POA: Diagnosis not present

## 2017-08-12 DIAGNOSIS — K259 Gastric ulcer, unspecified as acute or chronic, without hemorrhage or perforation: Secondary | ICD-10-CM | POA: Insufficient documentation

## 2017-08-12 DIAGNOSIS — R110 Nausea: Secondary | ICD-10-CM | POA: Insufficient documentation

## 2017-08-12 DIAGNOSIS — F17210 Nicotine dependence, cigarettes, uncomplicated: Secondary | ICD-10-CM | POA: Diagnosis not present

## 2017-08-12 DIAGNOSIS — Z79899 Other long term (current) drug therapy: Secondary | ICD-10-CM | POA: Diagnosis not present

## 2017-08-12 DIAGNOSIS — I10 Essential (primary) hypertension: Secondary | ICD-10-CM | POA: Insufficient documentation

## 2017-08-12 LAB — CBC WITH DIFFERENTIAL/PLATELET
BASOS ABS: 0 10*3/uL (ref 0.0–0.1)
BASOS PCT: 0 %
EOS ABS: 0.2 10*3/uL (ref 0.0–0.7)
Eosinophils Relative: 2 %
HCT: 36.7 % — ABNORMAL LOW (ref 39.0–52.0)
HEMOGLOBIN: 12.4 g/dL — AB (ref 13.0–17.0)
Lymphocytes Relative: 15 %
Lymphs Abs: 1.9 10*3/uL (ref 0.7–4.0)
MCH: 33.8 pg (ref 26.0–34.0)
MCHC: 33.8 g/dL (ref 30.0–36.0)
MCV: 100 fL (ref 78.0–100.0)
MONOS PCT: 10 %
Monocytes Absolute: 1.2 10*3/uL — ABNORMAL HIGH (ref 0.1–1.0)
NEUTROS ABS: 8.9 10*3/uL — AB (ref 1.7–7.7)
NEUTROS PCT: 73 %
Platelets: 420 10*3/uL — ABNORMAL HIGH (ref 150–400)
RBC: 3.67 MIL/uL — ABNORMAL LOW (ref 4.22–5.81)
RDW: 12.5 % (ref 11.5–15.5)
WBC: 12.2 10*3/uL — AB (ref 4.0–10.5)

## 2017-08-12 LAB — COMPREHENSIVE METABOLIC PANEL
ALBUMIN: 3.8 g/dL (ref 3.5–5.0)
ALK PHOS: 125 U/L (ref 38–126)
ALT: 10 U/L — ABNORMAL LOW (ref 17–63)
ANION GAP: 10 (ref 5–15)
AST: 17 U/L (ref 15–41)
BUN: 5 mg/dL — AB (ref 6–20)
CALCIUM: 9.1 mg/dL (ref 8.9–10.3)
CO2: 28 mmol/L (ref 22–32)
Chloride: 92 mmol/L — ABNORMAL LOW (ref 101–111)
Creatinine, Ser: 0.41 mg/dL — ABNORMAL LOW (ref 0.61–1.24)
GFR calc Af Amer: >60 mL/min (ref >60)
GFR calc non Af Amer: >60 mL/min (ref >60)
GLUCOSE: 121 mg/dL — AB (ref 65–99)
POTASSIUM: 3.9 mmol/L (ref 3.5–5.1)
SODIUM: 130 mmol/L — AB (ref 135–145)
Total Bilirubin: 0.3 mg/dL (ref 0.3–1.2)
Total Protein: 7.6 g/dL (ref 6.5–8.1)

## 2017-08-12 LAB — URINALYSIS, ROUTINE W REFLEX MICROSCOPIC
BILIRUBIN URINE: NEGATIVE
Glucose, UA: NEGATIVE mg/dL
HGB URINE DIPSTICK: NEGATIVE
Ketones, ur: 5 mg/dL — AB
LEUKOCYTES UA: NEGATIVE
NITRITE: NEGATIVE
PROTEIN: 100 mg/dL — AB
Specific Gravity, Urine: 1.025 (ref 1.005–1.030)
pH: 5 (ref 5.0–8.0)

## 2017-08-12 LAB — LIPASE, BLOOD: Lipase: 20 U/L (ref 11–51)

## 2017-08-12 LAB — ETHANOL

## 2017-08-12 LAB — TROPONIN I: Troponin I: <0.03 ng/mL (ref <0.03)

## 2017-08-12 MED ORDER — SUCRALFATE 1 GM/10ML PO SUSP
1.0000 g | Freq: Three times a day (TID) | ORAL | Status: DC
  Administered 2017-08-12: 1 g via ORAL
  Filled 2017-08-12: qty 10

## 2017-08-12 MED ORDER — SUCRALFATE 1 G PO TABS: 1.0000 g | ORAL_TABLET | Freq: Three times a day (TID) | ORAL | 0 refills | Status: DC

## 2017-08-12 MED ORDER — GI COCKTAIL ~~LOC~~
30.0000 mL | Freq: Once | ORAL | Status: AC
  Administered 2017-08-12: 30 mL via ORAL
  Filled 2017-08-12: qty 30

## 2017-08-12 MED ORDER — ONDANSETRON HCL 4 MG/2ML IJ SOLN
4.0000 mg | Freq: Once | INTRAMUSCULAR | Status: AC
  Administered 2017-08-12: 4 mg via INTRAVENOUS
  Filled 2017-08-12: qty 2

## 2017-08-12 MED ORDER — SODIUM CHLORIDE 0.9 % IV BOLUS (SEPSIS)
1000.0000 mL | Freq: Once | INTRAVENOUS | Status: AC
  Administered 2017-08-12: 1000 mL via INTRAVENOUS

## 2017-08-12 MED ORDER — IOPAMIDOL (ISOVUE-300) INJECTION 61%
100.0000 mL | Freq: Once | INTRAVENOUS | Status: AC | PRN
  Administered 2017-08-12: 100 mL via INTRAVENOUS

## 2017-08-12 MED ORDER — PANTOPRAZOLE SODIUM 40 MG IV SOLR
40.0000 mg | Freq: Once | INTRAVENOUS | Status: AC
  Administered 2017-08-12: 40 mg via INTRAVENOUS
  Filled 2017-08-12: qty 40

## 2017-08-12 MED ORDER — HYDROMORPHONE HCL 1 MG/ML IJ SOLN
1.0000 mg | Freq: Once | INTRAMUSCULAR | Status: AC
  Administered 2017-08-12: 1 mg via INTRAVENOUS
  Filled 2017-08-12: qty 1

## 2017-08-12 MED ORDER — PANTOPRAZOLE SODIUM 40 MG PO TBEC: 40.0000 mg | DELAYED_RELEASE_TABLET | Freq: Two times a day (BID) | ORAL | 5 refills | Status: DC

## 2017-08-12 MED ORDER — OMEPRAZOLE 20 MG PO CPDR: 20.0000 mg | DELAYED_RELEASE_CAPSULE | Freq: Every day | ORAL | 0 refills | Status: DC

## 2017-08-12 NOTE — ED Notes (Signed)
Checked with pt for urine sample,pt states he can't go right now,will try back in 30 minutes.

## 2017-08-12 NOTE — ED Provider Notes (Signed)
[place] DEPT Provider Note   CSN: [id number] Arrival date & time: 08/12/17  0303     History   Chief Complaint Chief Complaint  Patient presents with  . Abdominal Pain    HPI [name] is a 41 y.o. male.  Patient with ongoing upper abdominal pain similar to his previous episodes of pancreatitis. He takes oxycodone for this on a regular basis but this is not helping his pain. He denies any nausea or vomiting. Denies any fever. He was seen in this ED 4 days ago for similar pain and states it is worse now. He continues to drink alcohol and states he had 3 beers yesterday. States he drinks on a regular basis and understands that this makes his pancreatitis worse. He denies any fevers, dysuria hematuria. No previous abdominal surgeries. He has a follow-up appointment with GI for the first time later today.   The history is provided by the patient.  Abdominal Pain   Associated symptoms include nausea. Pertinent negatives include fever, diarrhea, vomiting, dysuria, hematuria, headaches, arthralgias and myalgias.    Past Medical History:  Diagnosis Date  . Back pain   . Bradycardia 08/17/2011  . COPD (chronic obstructive pulmonary disease) ([place])   . ETOH abuse   . GERD (gastroesophageal reflux disease)   . Macrocytosis 08/17/2011  . Pancreatitis   . Pneumonia   . SAH (subarachnoid hemorrhage) ([place])   . Seizures [facility])     Patient Active Problem List   Diagnosis Date Noted  . Alcohol abuse   . Atypical chest pain   . Hypercalcemia 10/08/2016  . Hypokalemia 10/08/2016  . Diarrhea 10/09/2015  . Pancreatitis, alcoholic, acute   . Alcohol-induced chronic pancreatitis ([place])   . Acute pancreatitis   . Pancreatitis 12/26/2014  . Pancreatic pseudocyst 12/26/2014  . Hyponatremia 12/26/2014  . HTN (hypertension) 07/27/2013  . Abdominal pain 07/11/2012  . Macrocytosis 08/17/2011  . Marijuana abuse 08/17/2011  . Bradycardia 08/17/2011  . Alcohol withdrawal ([place])  08/17/2011  . Elevated blood pressure 07/08/2011  . Left against medical advice 07/08/2011  . Acute alcoholic pancreatitis 32/95/1884  . Alcohol abuse, continuous 07/07/2011  . Tobacco abuse 07/07/2011  . Seizure disorder ([place]) 07/07/2011  . Adynamic ileus ([place]) 07/07/2011  . Wheezing 07/07/2011  . COPD (chronic obstructive pulmonary disease) ([place]) 07/07/2011    Past Surgical History:  Procedure Laterality Date  . BACK SURGERY    . SEPTOPLASTY         Home Medications    Prior to Admission medications   Medication Sig Start Date End Date Taking? Authorizing Provider  albuterol (PROVENTIL HFA;VENTOLIN HFA) 108 (90 BASE) MCG/ACT inhaler Inhale 2 puffs into the lungs every 6 (six) hours as needed for wheezing or shortness of breath.    [provider]  albuterol (PROVENTIL) (2.5 MG/3ML) 0.083% nebulizer solution Take 3 mLs (2.5 mg total) by nebulization every 4 (four) hours as needed for wheezing or shortness of breath. 09/09/13   Orpah Greek, MD  carbamazepine (TEGRETOL) 200 MG tablet Take 400 mg by mouth 2 (two) times daily.    [provider]  HYDROmorphone (DILAUDID) 4 MG tablet Take 1 tablet (4 mg total) by mouth every 6 (six) hours as needed for severe pain. Patient not taking: Reported on 08/07/2017 06/18/17   Milton Ferguson, MD  levETIRAcetam (KEPPRA) 500 MG tablet Take 500 mg by mouth 2 (two) times daily.     [provider]  lisinopril (PRINIVIL,ZESTRIL) 20 MG tablet Take 1 tablet (  20 mg total) by mouth daily. 10/10/16   Rosita Fire, MD  Multiple Vitamins-Minerals (MULTIVITAMIN ADULTS) TABS Take 1 tablet by mouth daily.    [provider]  ondansetron (ZOFRAN ODT) 8 MG disintegrating tablet Take 1 tablet (8 mg total) by mouth every 8 (eight) hours as needed for nausea or vomiting. 08/07/17   Dorie Rank, MD  Oxycodone HCl 10 MG TABS Take 10 mg by mouth 2 (two) times daily.    [provider]  traMADol (ULTRAM)  50 MG tablet Take 1 tablet (50 mg total) by mouth every 6 (six) hours as needed. 08/07/17   Dorie Rank, MD    Family History Family History  Problem Relation Age of Onset  . Coronary artery disease Mother        deceased age 48  . Seizures Father   . Alcohol abuse Father   . Colon cancer Neg Hx     Social History Social History  Substance Use Topics  . Smoking status: Current Every Day Smoker    Packs/day: 1.00    Years: 20.00    Types: Cigarettes  . Smokeless tobacco: Never Used  . Alcohol use 25.2 oz/week    42 Cans of beer per week     Comment: 4-6 beers a day      Allergies   Patient has no known allergies.   Review of Systems Review of Systems  Constitutional: Positive for activity change and appetite change. Negative for fever.  HENT: Negative for congestion.   Respiratory: Negative for cough, chest tightness and shortness of breath.   Cardiovascular: Negative for chest pain.  Gastrointestinal: Positive for abdominal pain and nausea. Negative for diarrhea and vomiting.  Genitourinary: Negative for dysuria and hematuria.  Musculoskeletal: Negative for arthralgias and myalgias.  Skin: Negative for pallor and rash.  Neurological: Negative for dizziness, weakness and headaches.   all other systems are negative except as noted in the HPI and PMH.     Physical Exam Updated Vital Signs BP (!) 189/92 (BP Location: Right Arm)   Pulse 65   Temp 98.1 F (36.7 C) (Oral)   Resp 18   Ht 5\' 9"  (1.753 m)   Wt 55.3 kg (122 lb)   SpO2 100%   BMI 18.02 kg/m   Physical Exam  Constitutional: He is oriented to person, place, and time. He appears well-developed and well-nourished. No distress.  uncomfortable  HENT:  Head: Normocephalic and atraumatic.  Mouth/Throat: Oropharynx is clear and moist. No oropharyngeal exudate.  Eyes: Pupils are equal, round, and reactive to light. Conjunctivae and EOM are normal.  Neck: Normal range of motion. Neck supple.  No  meningismus.  Cardiovascular: Normal rate, regular rhythm, normal heart sounds and intact distal pulses.   No murmur heard. Pulmonary/Chest: Effort normal and breath sounds normal. No respiratory distress.  Abdominal: Soft. There is tenderness. There is no rebound and no guarding.  Epigastric tenderness with guarding.  Abdomen otherwise soft.  Genitourinary:  Genitourinary Comments: No testicular tenderness  Musculoskeletal: Normal range of motion. He exhibits no edema or tenderness.  Neurological: He is alert and oriented to person, place, and time. No cranial nerve deficit. He exhibits normal muscle tone. Coordination normal.   5/5 strength throughout. CN 2-12 intact.Equal grip strength.   Skin: Skin is warm.  Psychiatric: He has a normal mood and affect. His behavior is normal.  Nursing note and vitals reviewed.    ED Treatments / Results  Labs (all labs ordered are listed,  but only abnormal results are displayed) Labs Reviewed  CBC WITH DIFFERENTIAL/PLATELET - Abnormal; Notable for the following:       Result Value   WBC 12.2 (*)    RBC 3.67 (*)    Hemoglobin 12.4 (*)    HCT 36.7 (*)    Platelets 420 (*)    Neutro Abs 8.9 (*)    Monocytes Absolute 1.2 (*)    All other components within normal limits  COMPREHENSIVE METABOLIC PANEL - Abnormal; Notable for the following:    Sodium 130 (*)    Chloride 92 (*)    Glucose, Bld 121 (*)    BUN 5 (*)    Creatinine, Ser 0.41 (*)    ALT 10 (*)    All other components within normal limits  URINALYSIS, ROUTINE W REFLEX MICROSCOPIC - Abnormal; Notable for the following:    Ketones, ur 5 (*)    Protein, ur 100 (*)    Bacteria, UA RARE (*)    Squamous Epithelial / LPF 0-5 (*)    All other components within normal limits  LIPASE, BLOOD  ETHANOL  TROPONIN I    EKG  EKG Interpretation None       Radiology Ct Abdomen Pelvis W Contrast  Result Date: 08/12/2017 CLINICAL DATA:  Severe abdominal pain. Seen here 4 days ago but  pain has gotten worse. EXAM: CT ABDOMEN AND PELVIS WITH CONTRAST TECHNIQUE: Multidetector CT imaging of the abdomen and pelvis was performed using the standard protocol following bolus administration of intravenous contrast. CONTRAST:  112mL ISOVUE-300 IOPAMIDOL (ISOVUE-300) INJECTION 61% COMPARISON:  CT 06/18/2017.  MRI 03/06/2017. FINDINGS: Lower chest: The lung bases are clear. Hepatobiliary: No focal liver abnormality is seen. No gallstones, gallbladder wall thickening, or biliary dilatation. Pancreas: Diffuse pancreatic parenchymal calcifications with pancreatic ductal dilatation likely representing changes of chronic pancreatitis. Stable appearance since previous study. Spleen: Normal in size without focal abnormality. Adrenals/Urinary Tract: Adrenal glands are unremarkable. Kidneys are normal, without renal calculi, focal lesion, or hydronephrosis. Bladder is unremarkable. Stomach/Bowel: The stomach is decompressed but despite this there is suggestion of gastric wall thickening and edema, more prominent than the previous study. Anterior loculation along the lesser curvature of the stomach likely represents a gastric ulcer. Consider endoscopy for further evaluation if clinically indicated. Small bowel are decompressed. Stool-filled colon without abnormal distention. No wall thickening. Appendix is normal. Vascular/Lymphatic: Aortic atherosclerosis. No enlarged abdominal or pelvic lymph nodes. Prominent calcification along the superior mesenteric artery suggesting multiple focal areas of stenosis. The vessel remains patent. Reproductive: Prostate gland is enlarged. Prominent seminal vesicles. Other: No free air in the abdomen. Small amount of free fluid in the pelvis is likely to be reactive. Abdominal wall musculature appears intact. Musculoskeletal: No acute or significant osseous findings. IMPRESSION: 1. Wall thickening and edema of the distal stomach with focal collection in the lesser curvature wall  consistent with gastric ulcer. Consider endoscopy for further evaluation if clinically indicated. 2. Changes of chronic pancreatitis. 3. Aortic atherosclerosis. 4. Prostate gland is enlarged. Electronically Signed   By: Lucienne Capers M.D.   On: 08/12/2017 07:01    Procedures Procedures (including critical care time)  Medications Ordered in ED Medications  sodium chloride 0.9 % bolus 1,000 mL (1,000 mLs Intravenous New Bag/Given 08/12/17 0426)  HYDROmorphone (DILAUDID) injection 1 mg (1 mg Intravenous Given 08/12/17 0431)  ondansetron (ZOFRAN) injection 4 mg (4 mg Intravenous Given 08/12/17 0428)     Initial Impression / Assessment and Plan / ED Course  I have reviewed the triage vital signs and the nursing notes.  Pertinent labs & imaging results that were available during my care of the patient were reviewed by me and considered in my medical decision making (see chart for details).    Epigastric pain similar to previous episodes of pancreatitis. Denies any vomiting or diarrhea. Admits to drinking alcohol.  Presentation concerning for recurrent chronic pancreatitis. Patient admits to drinking alcohol.  Patient given IV fluids, antiemetics and pain control.  LFTs are normal. Lipase is normal. Chronic hyponatremia.  CT findings concerning for gastric ulcer as well as chronic pancreatitis. Patient informed of same.  We'll start PPI and Carafate. Advised patient to avoid alcohol, NSAIDs, caffeine, spicy foods stop smoking  Patient asking for pain medication. He received 28 oxycodone tablets October 2. Reinforced that he needs to stop smoking and stop drinking. Also needs to stop using NSAIDs.  he has an appointment with GI this morning at 8:30.Advised to keep his appointment and get an EGD scheduled. Return precautions discussed. Final Clinical Impressions(s) / ED Diagnoses   Final diagnoses:  Acute gastric ulcer without hemorrhage or perforation  Alcohol-induced chronic  pancreatitis [facility])    New Prescriptions New Prescriptions   No medications on file     Ezequiel Essex, MD 08/12/17 [phone]

## 2017-08-12 NOTE — Discharge Instructions (Signed)
You have an ulcer as well as chronic pancreatitis. Stop drinking alcohol. Stop smoking. Avoid anti-inflammatory medications such as aspirin and ibuprofen and naproxen.Avoid spicy foods and caffeine.  Follow-up with the stomach doctor today as scheduled at 8:30. Return to the ED if you develop new or worsening symptoms.

## 2017-08-12 NOTE — Patient Instructions (Addendum)
1. Upper endoscopy as scheduled. See separate instructions.  2. You need to work towards NO alcohol at all. Your pancreas is getting damaged by the alcohol and you are at risk of chronic pain and diabetes. Plus the alcohol can cause permanent scarring in the liver. When you decide to stop drinking I recommend detox center given your history of DTs.  3. The pharmacy says you have pantoprazole 40 mg daily filled 07/30/17 and ED gave you Prilosec once daily. I RECOMMEND YOU TAKE PANTOPRAZOLE 40MG  TWICE DAILY AND I AM SENDING IN NEW RX. WE WILL CALL PHARMACY AND MAKE SURE THEY FILL THE CORRECT ONE. UNTIL YOU CAN GET IT FILLED WE HAVE PROVIDED YOU WITH DEXILANT TO TAKE ONCE DAILY BEFORE BREAKFAST.

## 2017-08-12 NOTE — Progress Notes (Signed)
Primary Care Physician:  Jose Gravel, MD  Primary Gastroenterologist:  Jose Cornea, MD   Chief Complaint  Patient presents with  . Pancreatitis    c/o abdominal pain  . gastric ulcer    HPI:  ROBI [name] is a 41 y.o. male here for further evaluation of chronic pancreatitis at request of Dr. Jani [name]. 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 He has h/o DTs. He continues to drink up to 12 pack of beer daily. For past week, he has had persistent abdominal pain. He had four ED visits for abd pain since 05/2017 For the same. Complains of poorly controlled pain with oxycodone twice a day. Has been taking lots of ibuprofen and aspirin powders for his pain. Pain in the epigastrium. Associated with nausea but no vomiting. Bowel movements regular. No blood in the stool or melena. Occasional heartburn, no dysphagia. He is not sure what medications he takes for her stomach. We contacted the pharmacy and he was taking Zantac twice a day, last filled in August. New prescription for 07/30/2017 for Protonix once daily, patient states he hasn't picked up yet. Just written for Prilosec once daily by EGD today.  CT abdomen and pelvis with contrast today in the ED showed diffuse pancreatic parenchymal calcifications with pancreatic ductal dilation likely representing chronic pancreatitis. Stable from prior studIES, CT and MRI in August and May respectively. Gastric wall thickening and edema more prominent than prior study. Gastric ulcer seen in the lesser curvature of the stomach. Prominent calcification along the SMA suggesting multiple focal areas of stenosis but the vessel remains patent.  MR abdomen with and without contrast in May 2018 showed diffuse dilation of the main pancreatic duct measuring up to 10 mm in the proximal pancreatic body. 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  Labs from today as outlined below.   Current Outpatient Prescriptions  Medication Sig Dispense Refill  . albuterol (PROVENTIL HFA;VENTOLIN HFA) 108 (90 BASE) MCG/ACT inhaler Inhale 2 puffs into the lungs every 6 (six) hours as needed for wheezing or shortness of breath.    Marland Kitchen albuterol (PROVENTIL) (2.5 MG/3ML) 0.083% nebulizer solution Take 3 mLs (2.5 mg total) by nebulization every 4 (four) hours as needed for wheezing or shortness of breath. 30 vial 0  . carbamazepine (TEGRETOL) 200 MG tablet Take 400 mg by mouth 2 (two) times daily.    Marland Kitchen levETIRAcetam (KEPPRA) 500 MG tablet Take 500 mg by mouth 2 (two) times daily.     . Multiple Vitamins-Minerals (MULTIVITAMIN ADULTS) TABS Take 1 tablet by mouth daily.    Marland Kitchen omeprazole (PRILOSEC) 20 MG capsule Take 1 capsule (20 mg total) by mouth daily. 30 capsule 0  . ondansetron (ZOFRAN ODT) 8 MG disintegrating tablet Take 1 tablet (8 mg total) by mouth every 8 (eight) hours as needed for nausea or vomiting. 20 tablet 0  . Oxycodone HCl 10 MG TABS Take 10 mg by mouth 2 (two) times daily.    . sucralfate (CARAFATE) 1 g tablet Take 1 tablet (1 g total) by mouth 4 (four) times daily -  with meals and at bedtime. (Patient not taking: Reported on 08/12/2017) 30 tablet 0   No current facility-administered medications for this visit.     Allergies as of 08/12/2017  . (  No Known Allergies)    Past Medical History:  Diagnosis Date  . Back pain   . Bradycardia 08/17/2011  . COPD (chronic obstructive pulmonary disease) ([place])   . ETOH abuse   . GERD (gastroesophageal reflux disease)   . Macrocytosis 08/17/2011  . Pancreatitis   . Pneumonia   . SAH (subarachnoid hemorrhage) ([place])   . Seizures ([place])     Past Surgical History:  Procedure Laterality Date  . BACK SURGERY    .  SEPTOPLASTY      Family History  Problem Relation Age of Onset  . Coronary artery disease Mother        deceased age 39  . Seizures Father   . Alcohol abuse Father   . Colon cancer Neg Hx     Social History   Social History  . Marital status: Single    Spouse name: N/A  . Number of children: N/A  . Years of education: N/A   Occupational History  . Not on file.   Social History Main Topics  . Smoking status: Current Every Day Smoker    Packs/day: 1.00    Years: 20.00    Types: Cigarettes  . Smokeless tobacco: Never Used  . Alcohol use Yes     Comment: 12 pack of beer daily.  . Drug use: Yes    Types: Marijuana     Comment: months   . Sexual activity: Not Currently   Other Topics Concern  . Not on file   Social History Narrative  . No narrative on file      ROS:  General: Negative for anorexia, weight loss, fever, chills, fatigue, weakness. Eyes: Negative for vision changes.  ENT: Negative for hoarseness, difficulty swallowing , nasal congestion. CV: Negative for chest pain, angina, palpitations, dyspnea on exertion, peripheral edema.  Respiratory: Negative for dyspnea at rest, dyspnea on exertion, cough, sputum, wheezing.  GI: See history of present illness. GU:  Negative for dysuria, hematuria, urinary incontinence, urinary frequency, nocturnal urination.  MS: Negative for joint pain, low back pain.  Derm: Negative for rash or itching.  Neuro: Negative for weakness, abnormal sensation, seizure, frequent headaches, memory loss, confusion.  Psych: Negative for anxiety, depression, suicidal ideation, hallucinations.  Endo: Negative for unusual weight change.  Heme: Negative for bruising or bleeding. Allergy: Negative for rash or hives.    Physical Examination:  BP (!) 146/64   Pulse 78   Temp 97.8 F (36.6 C) (Oral)   Ht 5\' 9"  (1.753 m)   Wt 118 lb 9.6 oz (53.8 kg)   BMI 17.51 kg/m    General: Well-nourished, well-developed in no acute distress.   Head: Normocephalic, atraumatic.   Eyes: Conjunctiva pink, no icterus. Mouth: Oropharyngeal mucosa moist and pink , no lesions erythema or exudate. Neck: Supple without thyromegaly, masses, or lymphadenopathy.  Lungs: Clear to auscultation bilaterally.  Heart: Regular rate and rhythm, no murmurs rubs or gallops.  Abdomen: Bowel sounds are normal, moderate epigastric/right upper quadrant tenderness, no hepatosplenomegaly or masses, no abdominal bruits or    hernia , no rebound or guarding.   Rectal: Not performed Extremities: No lower extremity edema. No clubbing or deformities.  Neuro: Alert and oriented x 4 , grossly normal neurologically.  Skin: Warm and dry, no rash or jaundice.   Psych: Alert and cooperative, normal mood and affect.  Labs: Lab Results  Component Value Date   LIPASE 20 08/12/2017   Lab Results  Component Value Date   CREATININE 0.41 (L)  08/12/2017   BUN 5 (L) 08/12/2017   NA 130 (L) 08/12/2017   K 3.9 08/12/2017   CL 92 (L) 08/12/2017   CO2 28 08/12/2017   Lab Results  Component Value Date   ALT 10 (L) 08/12/2017   AST 17 08/12/2017   ALKPHOS 125 08/12/2017   BILITOT 0.3 08/12/2017   Lab Results  Component Value Date   WBC 12.2 (H) 08/12/2017   HGB 12.4 (L) 08/12/2017   HCT 36.7 (L) 08/12/2017   MCV 100.0 08/12/2017   PLT 420 (H) 08/12/2017     Imaging Studies: Dg Chest 2 View  Result Date: 08/07/2017 CLINICAL DATA:  41 year old male with a history of left-sided chest pain EXAM: CHEST  2 VIEW COMPARISON:  06/23/2017 10/08/2016 FINDINGS: Cardiomediastinal silhouette unchanged. No evidence of central vascular congestion. No pneumothorax. No pleural effusion. No confluent airspace disease. Coarsened interstitial markings again demonstrated. Apical scarring/ chronic changes. No acute fracture. Similar configuration the vertebral bodies to the plain film 10/08/2016 IMPRESSION: Chronic lung changes without evidence of acute cardiopulmonary disease  Electronically Signed   By: Corrie Mckusick D.O.   On: 08/07/2017 20:40   Ct Abdomen Pelvis W Contrast  Result Date: 08/12/2017 CLINICAL DATA:  Severe abdominal pain. Seen here 4 days ago but pain has gotten worse. EXAM: CT ABDOMEN AND PELVIS WITH CONTRAST TECHNIQUE: Multidetector CT imaging of the abdomen and pelvis was performed using the standard protocol following bolus administration of intravenous contrast. CONTRAST:  141mL ISOVUE-300 IOPAMIDOL (ISOVUE-300) INJECTION 61% COMPARISON:  CT 06/18/2017.  MRI 03/06/2017. FINDINGS: Lower chest: The lung bases are clear. Hepatobiliary: No focal liver abnormality is seen. No gallstones, gallbladder wall thickening, or biliary dilatation. Pancreas: Diffuse pancreatic parenchymal calcifications with pancreatic ductal dilatation likely representing changes of chronic pancreatitis. Stable appearance since previous study. Spleen: Normal in size without focal abnormality. Adrenals/Urinary Tract: Adrenal glands are unremarkable. Kidneys are normal, without renal calculi, focal lesion, or hydronephrosis. Bladder is unremarkable. Stomach/Bowel: The stomach is decompressed but despite this there is suggestion of gastric wall thickening and edema, more prominent than the previous study. Anterior loculation along the lesser curvature of the stomach likely represents a gastric ulcer. Consider endoscopy for further evaluation if clinically indicated. Small bowel are decompressed. Stool-filled colon without abnormal distention. No wall thickening. Appendix is normal. Vascular/Lymphatic: Aortic atherosclerosis. No enlarged abdominal or pelvic lymph nodes. Prominent calcification along the superior mesenteric artery suggesting multiple focal areas of stenosis. The vessel remains patent. Reproductive: Prostate gland is enlarged. Prominent seminal vesicles. Other: No free air in the abdomen. Small amount of free fluid in the pelvis is likely to be reactive. Abdominal wall musculature  appears intact. Musculoskeletal: No acute or significant osseous findings. IMPRESSION: 1. Wall thickening and edema of the distal stomach with focal collection in the lesser curvature wall consistent with gastric ulcer. Consider endoscopy for further evaluation if clinically indicated. 2. Changes of chronic pancreatitis. 3. Aortic atherosclerosis. 4. Prostate gland is enlarged. Electronically Signed   By: Lucienne Capers M.D.   On: 08/12/2017 07:01

## 2017-08-12 NOTE — Assessment & Plan Note (Signed)
41 year old gentleman with history of alcohol-induced chronic pancreatitis who presents for further evaluation. He has had multiple ED visits in the past 2 months for abdominal pain. He is under narcotic contract with his PCP. States his pain management is inadequate. He continues to consume alcohol daily although states he has cut back due to increased abdominal pain. CT today showed chronic pancreatitis changes, suggestive gastric ulcer. Really has not been on regular PPI regimen.  Plan for EGD in the near future with deep sedation.  I have discussed the risks, alternatives, benefits with regards to but not limited to the risk of reaction to medication, bleeding, infection, perforation and the patient is agreeable to proceed. Written consent to be obtained.  Warning signs including worsening abdominal pain, melena, fever, rectal bleeding for which she should go straight to the EGD.  At for Protonix 40 mg twice a day. Due to confusion regarding his medications we have contacted [facility] to let them know that this is the prescription that he needs to get filled, not the omeprazole 20 mg daily or pantoprazole 40 mg daily. He states he doesn't have money to get his prescription so we did provide him with samples of Dexilant to use in interim.   Encouraged him to stop using ibuprofen and aspirin powders. He is not committed to stop drinking and because of his prior history of DTs would encourage inpatient detox when he is ready. Stressed importance of no alcohol use due to permanent changes of the pancreas, that he could develop diabetes, cirrhosis as well as other numerous medical issues related to his ongoing alcohol use.  Stressed the patient today that we would not provide any narcotic medications as he is under pain contract with his PCP. He voiced understanding.  Could consider pancreatic enzymes in the near future. Right now no evidence of malabsorption but ?aid in pain management.

## 2017-08-12 NOTE — Progress Notes (Signed)
cc'ed to pcp °

## 2017-08-12 NOTE — ED Triage Notes (Signed)
Pt c/o severe abdominal pain; pt was seen here x 4 days ago and pt states the pain has gotten worse; pt denies any n/v/d

## 2017-08-13 ENCOUNTER — Encounter (HOSPITAL_COMMUNITY): Payer: Self-pay

## 2017-08-13 ENCOUNTER — Encounter (HOSPITAL_COMMUNITY)
Admission: RE | Admit: 2017-08-13 | Discharge: 2017-08-13 | Disposition: A | Discharge status: Home / Self Care | Payer: Medicaid Other | Source: Ambulatory Visit | Attending: Internal Medicine | Admitting: Internal Medicine

## 2017-08-14 ENCOUNTER — Ambulatory Visit (HOSPITAL_COMMUNITY): Payer: Medicaid Other | Admitting: Anesthesiology

## 2017-08-14 ENCOUNTER — Encounter (HOSPITAL_COMMUNITY): Payer: Self-pay | Admitting: *Deleted

## 2017-08-14 ENCOUNTER — Ambulatory Visit (HOSPITAL_COMMUNITY)
Admission: RE | Admit: 2017-08-14 | Discharge: 2017-08-14 | Disposition: A | Discharge status: Home / Self Care | Payer: Medicaid Other | Source: Ambulatory Visit | Attending: Internal Medicine | Admitting: Internal Medicine

## 2017-08-14 ENCOUNTER — Encounter (HOSPITAL_COMMUNITY)
Admission: RE | Disposition: A | Discharge status: Home / Self Care | Payer: Self-pay | Source: Ambulatory Visit | Attending: Internal Medicine

## 2017-08-14 DIAGNOSIS — K861 Other chronic pancreatitis: Secondary | ICD-10-CM | POA: Diagnosis not present

## 2017-08-14 DIAGNOSIS — R933 Abnormal findings on diagnostic imaging of other parts of digestive tract: Secondary | ICD-10-CM | POA: Diagnosis not present

## 2017-08-14 DIAGNOSIS — F1010 Alcohol abuse, uncomplicated: Secondary | ICD-10-CM | POA: Insufficient documentation

## 2017-08-14 DIAGNOSIS — Z8249 Family history of ischemic heart disease and other diseases of the circulatory system: Secondary | ICD-10-CM | POA: Insufficient documentation

## 2017-08-14 DIAGNOSIS — K3189 Other diseases of stomach and duodenum: Secondary | ICD-10-CM | POA: Diagnosis not present

## 2017-08-14 DIAGNOSIS — K860 Alcohol-induced chronic pancreatitis: Secondary | ICD-10-CM

## 2017-08-14 DIAGNOSIS — I700 Atherosclerosis of aorta: Secondary | ICD-10-CM | POA: Insufficient documentation

## 2017-08-14 DIAGNOSIS — K259 Gastric ulcer, unspecified as acute or chronic, without hemorrhage or perforation: Secondary | ICD-10-CM | POA: Diagnosis not present

## 2017-08-14 DIAGNOSIS — J449 Chronic obstructive pulmonary disease, unspecified: Secondary | ICD-10-CM | POA: Insufficient documentation

## 2017-08-14 DIAGNOSIS — K253 Acute gastric ulcer without hemorrhage or perforation: Secondary | ICD-10-CM

## 2017-08-14 DIAGNOSIS — R001 Bradycardia, unspecified: Secondary | ICD-10-CM | POA: Diagnosis not present

## 2017-08-14 DIAGNOSIS — K219 Gastro-esophageal reflux disease without esophagitis: Secondary | ICD-10-CM | POA: Insufficient documentation

## 2017-08-14 DIAGNOSIS — N400 Enlarged prostate without lower urinary tract symptoms: Secondary | ICD-10-CM | POA: Insufficient documentation

## 2017-08-14 DIAGNOSIS — Z8711 Personal history of peptic ulcer disease: Secondary | ICD-10-CM | POA: Insufficient documentation

## 2017-08-14 DIAGNOSIS — Z79899 Other long term (current) drug therapy: Secondary | ICD-10-CM | POA: Insufficient documentation

## 2017-08-14 DIAGNOSIS — F17210 Nicotine dependence, cigarettes, uncomplicated: Secondary | ICD-10-CM | POA: Insufficient documentation

## 2017-08-14 HISTORY — PX: ESOPHAGOGASTRODUODENOSCOPY (EGD) WITH PROPOFOL: SHX5813

## 2017-08-14 SURGERY — ESOPHAGOGASTRODUODENOSCOPY (EGD) WITH PROPOFOL
Anesthesia: Monitor Anesthesia Care

## 2017-08-14 MED ORDER — LIDOCAINE HCL (CARDIAC) 10 MG/ML IV SOLN
INTRAVENOUS | Status: DC | PRN
  Administered 2017-08-14: 40 mg via INTRAVENOUS

## 2017-08-14 MED ORDER — MIDAZOLAM HCL 2 MG/2ML IJ SOLN
INTRAMUSCULAR | Status: AC
  Filled 2017-08-14: qty 2

## 2017-08-14 MED ORDER — LIDOCAINE VISCOUS 2 % MT SOLN: 15.0000 mL | Freq: Once | OROMUCOSAL | Status: DC

## 2017-08-14 MED ORDER — LIDOCAINE VISCOUS 2 % MT SOLN
OROMUCOSAL | Status: AC
  Filled 2017-08-14: qty 15

## 2017-08-14 MED ORDER — MIDAZOLAM HCL 2 MG/2ML IJ SOLN
1.0000 mg | INTRAMUSCULAR | Status: AC
Start: 2017-08-14 — End: 2017-08-14
  Administered 2017-08-14: 2 mg via INTRAVENOUS

## 2017-08-14 MED ORDER — CHLORHEXIDINE GLUCONATE CLOTH 2 % EX PADS: 6.0000 | MEDICATED_PAD | Freq: Once | CUTANEOUS | Status: DC

## 2017-08-14 MED ORDER — CHLORHEXIDINE GLUCONATE CLOTH 2 % EX PADS
6.0000 | MEDICATED_PAD | Freq: Once | CUTANEOUS | Status: DC
Start: 2017-08-14 — End: 2017-08-14

## 2017-08-14 MED ORDER — LIDOCAINE VISCOUS 2 % MT SOLN
OROMUCOSAL | Status: DC | PRN
  Administered 2017-08-14: 1 via OROMUCOSAL

## 2017-08-14 MED ORDER — STERILE WATER FOR IRRIGATION IR SOLN
Status: DC | PRN
  Administered 2017-08-14: 2.5 mL

## 2017-08-14 MED ORDER — FENTANYL CITRATE (PF) 100 MCG/2ML IJ SOLN
25.0000 ug | Freq: Once | INTRAMUSCULAR | Status: AC
  Administered 2017-08-14: 25 ug via INTRAVENOUS

## 2017-08-14 MED ORDER — FENTANYL CITRATE (PF) 100 MCG/2ML IJ SOLN
INTRAMUSCULAR | Status: AC
  Filled 2017-08-14: qty 2

## 2017-08-14 MED ORDER — PROPOFOL 500 MG/50ML IV EMUL
INTRAVENOUS | Status: DC | PRN
  Administered 2017-08-14: 150 ug/kg/min via INTRAVENOUS

## 2017-08-14 MED ORDER — LACTATED RINGERS IV SOLN
INTRAVENOUS | Status: DC
  Administered 2017-08-14: 13:00:00 via INTRAVENOUS

## 2017-08-14 NOTE — Interval H&P Note (Signed)
History and Physical Interval Note:  08/14/2017 1:21 PM  [name]  has presented today for surgery, with the diagnosis of gastric ulcer, chronic pancreatitis  The various methods of treatment have been discussed with the patient and family. After consideration of risks, benefits and other options for treatment, the patient has consented to  Procedure(s) with comments: ESOPHAGOGASTRODUODENOSCOPY (EGD) WITH PROPOFOL (N/A) - 1:30pm as a surgical intervention .  The patient's history has been reviewed, patient examined, no change in status, stable for surgery.  I have reviewed the patient's chart and labs.  Questions were answered to the patient's satisfaction.     [name]  No change. Patient denies dysphagia.  Diagnostic EGD per plan.  The risks, benefits, limitations, alternatives and imponderables have been reviewed with the patient. Potential for esophageal dilation, biopsy, etc. have also been reviewed.  Questions have been answered. All parties agreeable.

## 2017-08-14 NOTE — Discharge Instructions (Signed)
EGD Discharge instructions Please read the instructions outlined below and refer to this sheet in the next few weeks. These discharge instructions provide you with general information on caring for yourself after you leave the hospital. Your doctor may also give you specific instructions. While your treatment has been planned according to the most current medical practices available, unavoidable complications occasionally occur. If you have any problems or questions after discharge, please call your doctor. ACTIVITY  You may resume your regular activity but move at a slower pace for the next 24 hours.   Take frequent rest periods for the next 24 hours.   Walking will help expel (get rid of) the air and reduce the bloated feeling in your abdomen.   No driving for 24 hours (because of the anesthesia (medicine) used during the test).   You may shower.   Do not sign any important legal documents or operate any machinery for 24 hours (because of the anesthesia used during the test).  NUTRITION  Drink plenty of fluids.   You may resume your normal diet.   Begin with a light meal and progress to your normal diet.   Avoid alcoholic beverages for 24 hours or as instructed by your caregiver.  MEDICATIONS  You may resume your normal medications unless your caregiver tells you otherwise.  WHAT YOU CAN EXPECT TODAY  You may experience abdominal discomfort such as a feeling of fullness or gas pains.  FOLLOW-UP  Your doctor will discuss the results of your test with you.  SEEK IMMEDIATE MEDICAL ATTENTION IF ANY OF THE FOLLOWING OCCUR:  Excessive nausea (feeling sick to your stomach) and/or vomiting.   Severe abdominal pain and distention (swelling).   Trouble swallowing.   Temperature over 101 F (37.8 C).   Rectal bleeding or vomiting of blood.    Absolutely avoid all aspirin and nonsteroidal agents.   Continue Protonix 40 mg twice daily.  Office visit with [place] in 3  months  Further recommendations to follow pending review

## 2017-08-14 NOTE — Transfer of Care (Signed)
Immediate Anesthesia Transfer of Care Note  Patient: [name]  Procedure(s) Performed: ESOPHAGOGASTRODUODENOSCOPY (EGD) WITH PROPOFOL (N/A )  Patient Location: PACU  Anesthesia Type:MAC  Level of Consciousness: awake, alert  and oriented  Airway & Oxygen Therapy: Patient Spontanous Breathing  Post-op Assessment: Report given to RN and Post -op Vital signs reviewed and stable  Post vital signs: Reviewed and stable  Last Vitals: There were no vitals filed for this visit.  Last Pain:  Vitals:   08/14/17 1232  PainSc: 0-No pain      Patients Stated Pain Goal: 5 (69/45/03 8882)  Complications: No apparent anesthesia complications

## 2017-08-14 NOTE — H&P (View-Only) (Signed)
Primary Care Physician:  Jani Gravel, MD  Primary Gastroenterologist:  Garfield Cornea, MD   Chief Complaint  Patient presents with  . Pancreatitis    c/o abdominal pain  . gastric ulcer    HPI:  [name] is a 41 y.o. male here for further evaluation of chronic pancreatitis at request of Dr. Jani Gravel. 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 He has h/o DTs. He continues to drink up to 12 pack of beer daily. For past week, he has had persistent abdominal pain. He had four ED visits for abd pain since 05/2017 For the same. Complains of poorly controlled pain with oxycodone twice a day. Has been taking lots of ibuprofen and aspirin powders for his pain. Pain in the epigastrium. Associated with nausea but no vomiting. Bowel movements regular. No blood in the stool or melena. Occasional heartburn, no dysphagia. He is not sure what medications he takes for her stomach. We contacted the pharmacy and he was taking Zantac twice a day, last filled in August. New prescription for 07/30/2017 for Protonix once daily, patient states he hasn't picked up yet. Just written for Prilosec once daily by EGD today.  CT abdomen and pelvis with contrast today in the ED showed diffuse pancreatic parenchymal calcifications with pancreatic ductal dilation likely representing chronic pancreatitis. Stable from prior studIES, CT and MRI in August and May respectively. Gastric wall thickening and edema more prominent than prior study. Gastric ulcer seen in the lesser curvature of the stomach. Prominent calcification along the SMA suggesting multiple focal areas of stenosis but the vessel remains patent.  MR abdomen with and without contrast in May 2018 showed diffuse dilation of the main pancreatic duct measuring up to 10 mm in the proximal pancreatic body. 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  Labs from today as outlined below.   Current Outpatient Prescriptions  Medication Sig Dispense Refill  . albuterol (PROVENTIL HFA;VENTOLIN HFA) 108 (90 BASE) MCG/ACT inhaler Inhale 2 puffs into the lungs every 6 (six) hours as needed for wheezing or shortness of breath.    Marland Kitchen albuterol (PROVENTIL) (2.5 MG/3ML) 0.083% nebulizer solution Take 3 mLs (2.5 mg total) by nebulization every 4 (four) hours as needed for wheezing or shortness of breath. 30 vial 0  . carbamazepine (TEGRETOL) 200 MG tablet Take 400 mg by mouth 2 (two) times daily.    Marland Kitchen levETIRAcetam (KEPPRA) 500 MG tablet Take 500 mg by mouth 2 (two) times daily.     . Multiple Vitamins-Minerals (MULTIVITAMIN ADULTS) TABS Take 1 tablet by mouth daily.    Marland Kitchen omeprazole (PRILOSEC) 20 MG capsule Take 1 capsule (20 mg total) by mouth daily. 30 capsule 0  . ondansetron (ZOFRAN ODT) 8 MG disintegrating tablet Take 1 tablet (8 mg total) by mouth every 8 (eight) hours as needed for nausea or vomiting. 20 tablet 0  . Oxycodone HCl 10 MG TABS Take 10 mg by mouth 2 (two) times daily.    . sucralfate (CARAFATE) 1 g tablet Take 1 tablet (1 g total) by mouth 4 (four) times daily -  with meals and at bedtime. (Patient not taking: Reported on 08/12/2017) 30 tablet 0   No current facility-administered medications for this visit.     Allergies as of 08/12/2017  . (  No Known Allergies)    Past Medical History:  Diagnosis Date  . Back pain   . Bradycardia 08/17/2011  . COPD (chronic obstructive pulmonary disease) ([place])   . ETOH abuse   . GERD (gastroesophageal reflux disease)   . Macrocytosis 08/17/2011  . Pancreatitis   . Pneumonia   . SAH (subarachnoid hemorrhage) ([place])   . Seizures ([place])     Past Surgical History:  Procedure Laterality Date  . BACK SURGERY    .  SEPTOPLASTY      Family History  Problem Relation Age of Onset  . Coronary artery disease Mother        deceased age 50  . Seizures Father   . Alcohol abuse Father   . Colon cancer Neg Hx     Social History   Social History  . Marital status: Single    Spouse name: N/A  . Number of children: N/A  . Years of education: N/A   Occupational History  . Not on file.   Social History Main Topics  . Smoking status: Current Every Day Smoker    Packs/day: 1.00    Years: 20.00    Types: Cigarettes  . Smokeless tobacco: Never Used  . Alcohol use Yes     Comment: 12 pack of beer daily.  . Drug use: Yes    Types: Marijuana     Comment: months   . Sexual activity: Not Currently   Other Topics Concern  . Not on file   Social History Narrative  . No narrative on file      ROS:  General: Negative for anorexia, weight loss, fever, chills, fatigue, weakness. Eyes: Negative for vision changes.  ENT: Negative for hoarseness, difficulty swallowing , nasal congestion. CV: Negative for chest pain, angina, palpitations, dyspnea on exertion, peripheral edema.  Respiratory: Negative for dyspnea at rest, dyspnea on exertion, cough, sputum, wheezing.  GI: See history of present illness. GU:  Negative for dysuria, hematuria, urinary incontinence, urinary frequency, nocturnal urination.  MS: Negative for joint pain, low back pain.  Derm: Negative for rash or itching.  Neuro: Negative for weakness, abnormal sensation, seizure, frequent headaches, memory loss, confusion.  Psych: Negative for anxiety, depression, suicidal ideation, hallucinations.  Endo: Negative for unusual weight change.  Heme: Negative for bruising or bleeding. Allergy: Negative for rash or hives.    Physical Examination:  BP (!) 146/64   Pulse 78   Temp 97.8 F (36.6 C) (Oral)   Ht 5\' 9"  (1.753 m)   Wt 118 lb 9.6 oz (53.8 kg)   BMI 17.51 kg/m    General: Well-nourished, well-developed in no acute distress.   Head: Normocephalic, atraumatic.   Eyes: Conjunctiva pink, no icterus. Mouth: Oropharyngeal mucosa moist and pink , no lesions erythema or exudate. Neck: Supple without thyromegaly, masses, or lymphadenopathy.  Lungs: Clear to auscultation bilaterally.  Heart: Regular rate and rhythm, no murmurs rubs or gallops.  Abdomen: Bowel sounds are normal, moderate epigastric/right upper quadrant tenderness, no hepatosplenomegaly or masses, no abdominal bruits or    hernia , no rebound or guarding.   Rectal: Not performed Extremities: No lower extremity edema. No clubbing or deformities.  Neuro: Alert and oriented x 4 , grossly normal neurologically.  Skin: Warm and dry, no rash or jaundice.   Psych: Alert and cooperative, normal mood and affect.  Labs: Lab Results  Component Value Date   LIPASE 20 08/12/2017   Lab Results  Component Value Date   CREATININE 0.41 (L)  08/12/2017   BUN 5 (L) 08/12/2017   NA 130 (L) 08/12/2017   K 3.9 08/12/2017   CL 92 (L) 08/12/2017   CO2 28 08/12/2017   Lab Results  Component Value Date   ALT 10 (L) 08/12/2017   AST 17 08/12/2017   ALKPHOS 125 08/12/2017   BILITOT 0.3 08/12/2017   Lab Results  Component Value Date   WBC 12.2 (H) 08/12/2017   HGB 12.4 (L) 08/12/2017   HCT 36.7 (L) 08/12/2017   MCV 100.0 08/12/2017   PLT 420 (H) 08/12/2017     Imaging Studies: Dg Chest 2 View  Result Date: 08/07/2017 CLINICAL DATA:  41 year old male with a history of left-sided chest pain EXAM: CHEST  2 VIEW COMPARISON:  06/23/2017 10/08/2016 FINDINGS: Cardiomediastinal silhouette unchanged. No evidence of central vascular congestion. No pneumothorax. No pleural effusion. No confluent airspace disease. Coarsened interstitial markings again demonstrated. Apical scarring/ chronic changes. No acute fracture. Similar configuration the vertebral bodies to the plain film 10/08/2016 IMPRESSION: Chronic lung changes without evidence of acute cardiopulmonary disease  Electronically Signed   By: Corrie Mckusick D.O.   On: 08/07/2017 20:40   Ct Abdomen Pelvis W Contrast  Result Date: 08/12/2017 CLINICAL DATA:  Severe abdominal pain. Seen here 4 days ago but pain has gotten worse. EXAM: CT ABDOMEN AND PELVIS WITH CONTRAST TECHNIQUE: Multidetector CT imaging of the abdomen and pelvis was performed using the standard protocol following bolus administration of intravenous contrast. CONTRAST:  130mL ISOVUE-300 IOPAMIDOL (ISOVUE-300) INJECTION 61% COMPARISON:  CT 06/18/2017.  MRI 03/06/2017. FINDINGS: Lower chest: The lung bases are clear. Hepatobiliary: No focal liver abnormality is seen. No gallstones, gallbladder wall thickening, or biliary dilatation. Pancreas: Diffuse pancreatic parenchymal calcifications with pancreatic ductal dilatation likely representing changes of chronic pancreatitis. Stable appearance since previous study. Spleen: Normal in size without focal abnormality. Adrenals/Urinary Tract: Adrenal glands are unremarkable. Kidneys are normal, without renal calculi, focal lesion, or hydronephrosis. Bladder is unremarkable. Stomach/Bowel: The stomach is decompressed but despite this there is suggestion of gastric wall thickening and edema, more prominent than the previous study. Anterior loculation along the lesser curvature of the stomach likely represents a gastric ulcer. Consider endoscopy for further evaluation if clinically indicated. Small bowel are decompressed. Stool-filled colon without abnormal distention. No wall thickening. Appendix is normal. Vascular/Lymphatic: Aortic atherosclerosis. No enlarged abdominal or pelvic lymph nodes. Prominent calcification along the superior mesenteric artery suggesting multiple focal areas of stenosis. The vessel remains patent. Reproductive: Prostate gland is enlarged. Prominent seminal vesicles. Other: No free air in the abdomen. Small amount of free fluid in the pelvis is likely to be reactive. Abdominal wall musculature  appears intact. Musculoskeletal: No acute or significant osseous findings. IMPRESSION: 1. Wall thickening and edema of the distal stomach with focal collection in the lesser curvature wall consistent with gastric ulcer. Consider endoscopy for further evaluation if clinically indicated. 2. Changes of chronic pancreatitis. 3. Aortic atherosclerosis. 4. Prostate gland is enlarged. Electronically Signed   By: Lucienne Capers M.D.   On: 08/12/2017 07:01

## 2017-08-14 NOTE — Op Note (Signed)
[facility] Patient Name: [name] Procedure Date: 08/14/2017 1:03 PM MRN: [id number] Date of Birth: 10-11-1976 Attending MD: Norvel Richards , MD CSN: [id number] Age: 41 Admit Type: Outpatient Procedure:                Upper GI endoscopy Indications:              Abnormal CT of the GI tract Providers:                Norvel Richards, MD Referring MD:              Medicines:                Propofol per Anesthesia Complications:            No immediate complications. Estimated Blood Loss:     Estimated blood loss was minimal. Procedure:                Pre-Anesthesia Assessment:                           - Prior to the procedure, a History and Physical                            was performed, and patient medications and                            allergies were reviewed. The patient's tolerance of                            previous anesthesia was also reviewed. The risks                            and benefits of the procedure and the sedation                            options and risks were discussed with the patient.                            All questions were answered, and informed consent                            was obtained. Prior Anticoagulants: The patient has                            taken no previous anticoagulant or antiplatelet                            agents. ASA Grade Assessment: III - A patient with                            severe systemic disease. After reviewing the risks                            and benefits, the patient was deemed in  satisfactory condition to undergo the procedure.                           After obtaining informed consent, the endoscope was                            passed under direct vision. Throughout the                            procedure, the patient's blood pressure, pulse, and                            oxygen saturations were monitored continuously. The      [id number] scope was introduced through the                            and advanced to the second part of duodenum. The                            upper GI endoscopy was accomplished without                            difficulty. The patient tolerated the procedure                            well. Scope In: 1:40:14 PM Scope Out: 1:48:44 PM Total Procedure Duration: 0 hours 8 minutes 30 seconds  Findings:      The examined esophagus was normal.      Three cratered gastric ulcers with no stigmata of bleeding were found in       the stomach. (2) ulcers on the angularis. Large area of deep cratered       ulceration approximately 4 x 4 centimeters in the antrum/prepyloric area       superior to pyloric channel.This was biopsied with a cold forceps for       histology. Estimated blood loss was minimal.      Mucosal changes were found in the duodenum. Specifically, fod tips       appeared to be 'scalloped" .This was biopsied with a cold forceps for       histology. Estimated blood loss was minimal. Impression:               - Normal esophagus.                           - Gastric ulcers with no stigmata of bleeding.                            Biopsied.                           - Mucosal changes in the duodenum. Biopsied. Moderate Sedation:      Moderate (conscious) sedation was personally administered by an       anesthesia professional. The following parameters were monitored: oxygen       saturation, heart rate, blood pressure, respiratory rate, EKG, adequacy       of pulmonary  ventilation, and response to care. Total physician       intraservice time was 14 minutes. Recommendation:           - Patient has a contact number available for                            emergencies. The signs and symptoms of potential                            delayed complications were discussed with the                            patient. Return to normal activities tomorrow.                             Written discharge instructions were provided to the                            patient.                           - Resume previous diet. Continue Protonix twice                            daily.                           - Continue present medications. Absolutely avoid                            all forms of aspirin and NSAIDs.                           - Await pathology results.                           - Repeat upper endoscopy in 3 months for                            surveillance.                           - Return to GI clinic in 3 months. Procedure Code(s):        --- Professional ---                           [phone], Esophagogastroduodenoscopy, flexible,                            transoral; with biopsy, single or multiple Diagnosis Code(s):        --- Professional ---                           K25.9, Gastric ulcer, unspecified as acute or                            chronic, without  hemorrhage or perforation                           K31.89, Other diseases of stomach and duodenum                           R93.3, Abnormal findings on diagnostic imaging of                            other parts of digestive tract CPT copyright 2016 [employer]. All rights reserved. The codes documented in this report are preliminary and upon coder review may  be revised to meet current compliance requirements. [name]. Rourk, MD Norvel Richards, MD 08/14/2017 2:00:13 PM This report has been signed electronically. Number of Addenda: 0

## 2017-08-14 NOTE — Anesthesia Postprocedure Evaluation (Signed)
Anesthesia Post Note  Patient: [name]  Procedure(s) Performed: ESOPHAGOGASTRODUODENOSCOPY (EGD) WITH PROPOFOL (N/A )  Patient location during evaluation: PACU Anesthesia Type: MAC Level of consciousness: awake and alert, oriented and patient cooperative Pain management: pain level controlled Vital Signs Assessment: post-procedure vital signs reviewed and stable Respiratory status: spontaneous breathing Cardiovascular status: stable Postop Assessment: no apparent nausea or vomiting Anesthetic complications: no     Last Vitals: There were no vitals filed for this visit.  Last Pain:  Vitals:   08/14/17 1232  PainSc: 0-No pain                 [name] A

## 2017-08-14 NOTE — Addendum Note (Signed)
Addendum  created 08/14/17 1425 by Mickel Baas, CRNA   Anesthesia Intra Flowsheets edited

## 2017-08-14 NOTE — Anesthesia Preprocedure Evaluation (Addendum)
Anesthesia Evaluation  Patient identified by MRN, date of birth, ID band Patient awake    Reviewed: Allergy & Precautions, NPO status , Patient's Chart, lab work & pertinent test results  Airway        Dental   Pulmonary COPD, Current Smoker,    breath sounds clear to auscultation       Cardiovascular hypertension, Pt. on medications  Rhythm:Regular Rate:Normal     Neuro/Psych Seizures -,     GI/Hepatic PUD, GERD  ,  Endo/Other    Renal/GU      Musculoskeletal   Abdominal   Peds  Hematology   Anesthesia Other Findings Alcohol abuse Used inhaler 2 puffs in pre-op  Reproductive/Obstetrics                             Anesthesia Physical Anesthesia Plan  ASA: III  Anesthesia Plan: MAC   Post-op Pain Management:    Induction: Intravenous  PONV Risk Score and Plan:   Airway Management Planned: Simple Face Mask  Additional Equipment:   Intra-op Plan:   Post-operative Plan:   Informed Consent: I have reviewed the patients History and Physical, chart, labs and discussed the procedure including the risks, benefits and alternatives for the proposed anesthesia with the patient or authorized representative who has indicated his/her understanding and acceptance.     Plan Discussed with:   Anesthesia Plan Comments:         Anesthesia Quick Evaluation

## 2017-08-16 ENCOUNTER — Encounter (HOSPITAL_COMMUNITY): Payer: Self-pay | Admitting: Internal Medicine

## 2017-08-20 ENCOUNTER — Encounter: Payer: Self-pay | Admitting: Internal Medicine

## 2017-11-14 ENCOUNTER — Ambulatory Visit: Payer: Medicaid Other | Admitting: Gastroenterology

## 2017-12-31 ENCOUNTER — Encounter: Payer: Self-pay | Admitting: Gastroenterology

## 2017-12-31 ENCOUNTER — Other Ambulatory Visit: Payer: Self-pay

## 2017-12-31 ENCOUNTER — Ambulatory Visit: Payer: Medicaid Other | Admitting: Gastroenterology

## 2017-12-31 ENCOUNTER — Telehealth: Payer: Self-pay

## 2017-12-31 VITALS — BP 133/65 | HR 74 | Temp 97.7°F | Ht 69.0 in | Wt 121.8 lb

## 2017-12-31 DIAGNOSIS — K861 Other chronic pancreatitis: Secondary | ICD-10-CM

## 2017-12-31 DIAGNOSIS — F1010 Alcohol abuse, uncomplicated: Secondary | ICD-10-CM

## 2017-12-31 DIAGNOSIS — R1013 Epigastric pain: Secondary | ICD-10-CM | POA: Diagnosis not present

## 2017-12-31 DIAGNOSIS — K860 Alcohol-induced chronic pancreatitis: Secondary | ICD-10-CM | POA: Diagnosis not present

## 2017-12-31 DIAGNOSIS — K253 Acute gastric ulcer without hemorrhage or perforation: Secondary | ICD-10-CM | POA: Diagnosis not present

## 2017-12-31 DIAGNOSIS — K259 Gastric ulcer, unspecified as acute or chronic, without hemorrhage or perforation: Secondary | ICD-10-CM

## 2017-12-31 MED ORDER — PANCRELIPASE (LIP-PROT-AMYL) 40000-126000 UNITS PO CPEP: 2.0000 | ORAL_CAPSULE | Freq: Three times a day (TID) | ORAL | 5 refills | Status: DC

## 2017-12-31 NOTE — Patient Instructions (Addendum)
1. Upper endoscopy as scheduled. See separate instructions.  2. Consider taking Zenpep (pancreatic enzymes) to see if this will help your pain. Take two WITH meals and one WITH snacks. RX sent to your pharmacy.

## 2017-12-31 NOTE — Assessment & Plan Note (Signed)
Persistent epigastric pain although patient reports that it has improved on Protonix. He has a history of chronic alcohol abuse, chronic pancreatitis, gastric ulcers on recent EGD in October his outline. He is due for surveillance EGD at this time. Plan for procedure with propofol.  I have discussed the risks, alternatives, benefits with regards to but not limited to the risk of reaction to medication, bleeding, infection, perforation and the patient is agreeable to proceed. Written consent to be obtained.  Patient has no interest in quitting alcohol use.  Will add Zenpep 40,000 units, take two with meals and one with snacks to see if this helps with his abdominal pain.  Await EGD findings, but would consider CTA to further evaluate SMA findings on prior CT imaging.

## 2017-12-31 NOTE — Telephone Encounter (Signed)
Called and informed pt of pre-op appt 01/28/18 at 11:00am. Letter mailed.

## 2017-12-31 NOTE — Progress Notes (Signed)
Primary Care Physician: Jani Gravel, MD  Primary Gastroenterologist:  Garfield Cornea, MD   Chief Complaint  Patient presents with  . Pancreatitis    f/u. Doing okay  . gastric ulcer    f/u. Doing fine. Feels protonix is working well    HPI: [name] is a 42 y.o. male here for follow-up of gastric ulcers, chronic pancreatitis. He was seen back in October 2018. See note for complete details. Since then he had a EGD on August 14, 2017 for persistent abdominal pain. He had three cratered gastric ulcers with no stigmata bleeding. Two at the angularis. Large area of deep cratered ulceration measuring 4 x 4 cm in the antrum/pre-pyloric area superior to the pyloric channel. Biopsies were benign. No H. pylori. Duodenal biopsies negative for celiac. Advised to come back for three month follow-up EGD.  Patient states Protonix BID is helping his abdominal pain. He continues to drink at least eight beers daily. He is not interested in quitting. No heartburn on Protonix. No nausea or vomiting. Bowel function is regular. No melena rectal bleeding.   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  Current Outpatient Medications  Medication Sig Dispense Refill  . albuterol (PROVENTIL HFA;VENTOLIN HFA) 108 (90 BASE) MCG/ACT inhaler Inhale 2 puffs into the lungs every 6 (six) hours as needed for wheezing or shortness of breath.    Marland Kitchen albuterol (PROVENTIL) (2.5 MG/3ML) 0.083% nebulizer solution Take 3 mLs (2.5 mg total) by nebulization every 4 (four) hours as needed for wheezing or shortness of breath. 30 vial 0  . carbamazepine (TEGRETOL) 200 MG tablet Take 400 mg by mouth 2 (two) times daily.    Marland Kitchen levETIRAcetam (KEPPRA) 500 MG tablet Take 500 mg by mouth 2 (two) times daily.     Marland Kitchen lisinopril (PRINIVIL,ZESTRIL) 20 MG tablet Take 20 mg by mouth daily.    .  Multiple Vitamins-Minerals (MULTIVITAMIN ADULTS) TABS Take 1 tablet by mouth daily.    . Oxycodone HCl 10 MG TABS Take 10 mg by mouth 2 (two) times daily.    . pantoprazole (PROTONIX) 40 MG tablet Take 1 tablet (40 mg total) by mouth 2 (two) times daily before a meal. 60 tablet 5   No current facility-administered medications for this visit.     Allergies as of 12/31/2017  . (No Known Allergies)   Past Medical History:  Diagnosis Date  . Back pain   . Bradycardia 08/17/2011  . COPD (chronic obstructive pulmonary disease) ([place])   . ETOH abuse   . GERD (gastroesophageal reflux disease)   . Macrocytosis 08/17/2011  . Pancreatitis   . Pneumonia   . SAH (subarachnoid hemorrhage) ([place])   . Seizures ([place])    Past Surgical History:  Procedure Laterality Date  . BACK SURGERY    . ESOPHAGOGASTRODUODENOSCOPY (EGD) WITH PROPOFOL N/A 08/14/2017   Procedure: ESOPHAGOGASTRODUODENOSCOPY (EGD) WITH PROPOFOL;  Surgeon: Daneil Dolin, MD;  Location: [facility];  Service: Endoscopy;  Laterality: N/A;  1:30pm  . SEPTOPLASTY     Family History  Problem Relation Age of Onset  . Coronary artery disease Mother        deceased age 81  . Seizures Father   . Alcohol abuse Father   . Colon cancer Neg Hx    Social History   Tobacco Use  . Smoking status: Current Every Day Smoker  Packs/day: 1.00    Years: 20.00    Pack years: 20.00    Types: Cigarettes  . Smokeless tobacco: Never Used  Substance Use Topics  . Alcohol use: Yes    Comment: 12 pack of beer daily.  . Drug use: Yes    Types: Marijuana    Comment: months     ROS:  General: Negative for anorexia, weight loss, fever, chills, fatigue, weakness. ENT: Negative for hoarseness, difficulty swallowing , nasal congestion. CV: Negative for chest pain, angina, palpitations, dyspnea on exertion, peripheral edema.  Respiratory: Negative for dyspnea at rest, dyspnea on exertion, cough, sputum, wheezing.  GI: See history of present  illness. GU:  Negative for dysuria, hematuria, urinary incontinence, urinary frequency, nocturnal urination.  Endo: Negative for unusual weight change.    Physical Examination:   BP 133/65   Pulse 74   Temp 97.7 F (36.5 C) (Oral)   Ht 5\' 9"  (1.753 m)   Wt 121 lb 12.8 oz (55.2 kg)   BMI 17.99 kg/m   General: Well-nourished, well-developed in no acute distress. Appears older than stated age. Eyes: No icterus. Mouth: Oropharyngeal mucosa moist and pink , no lesions erythema or exudate. Lungs: Clear to auscultation bilaterally.  Heart: Regular rate and rhythm, no murmurs rubs or gallops.  Abdomen: Bowel sounds are normal, moderate right upper quadrant tenderness/epigastric tenderness, nondistended, no hepatosplenomegaly or masses, no abdominal bruits or hernia , no rebound or guarding.   Extremities: No lower extremity edema. No clubbing or deformities. Neuro: Alert and oriented x 4   Skin: Warm and dry, no jaundice.   Psych: Alert and cooperative, normal mood and affect.  Labs:  Lab Results  Component Value Date   CREATININE 0.41 (L) 08/12/2017   BUN 5 (L) 08/12/2017   NA 130 (L) 08/12/2017   K 3.9 08/12/2017   CL 92 (L) 08/12/2017   CO2 28 08/12/2017   Lab Results  Component Value Date   ALT 10 (L) 08/12/2017   AST 17 08/12/2017   ALKPHOS 125 08/12/2017   BILITOT 0.3 08/12/2017   Lab Results  Component Value Date   LIPASE 20 08/12/2017   Lab Results  Component Value Date   WBC 12.2 (H) 08/12/2017   HGB 12.4 (L) 08/12/2017   HCT 36.7 (L) 08/12/2017   MCV 100.0 08/12/2017   PLT 420 (H) 08/12/2017    Imaging Studies: No results found.

## 2017-12-31 NOTE — Progress Notes (Signed)
CC'ED TO PCP 

## 2018-01-22 NOTE — Patient Instructions (Signed)
Rajvir Ernster Lambertson  01/22/2018     @PREFPERIOPPHARMACY @   Your procedure is scheduled on  02/03/2018   Report to [facility] at  800   A.M.  Call this number if you have problems the morning of surgery:  [phone]   Remember:  Do not eat food or drink liquids after midnight.  Take these medicines the morning of surgery with A SIP OF WATER  Tegretol, keppra, lisinopril, oxycodone, protonix. Use your inhaler and your nebulizer before you come.   Do not wear jewelry, make-up or nail polish.  Do not wear lotions, powders, or perfumes, or deodorant.  Do not shave 48 hours prior to surgery.  Men may shave face and neck.  Do not bring valuables to the hospital.  [facility] is not responsible for any belongings or valuables.  Contacts, dentures or bridgework may not be worn into surgery.  Leave your suitcase in the car.  After surgery it may be brought to your room.  For patients admitted to the hospital, discharge time will be determined by your treatment team.  Patients discharged the day of surgery will not be allowed to drive home.   Name and phone number of your driver:   family Special instructions:  Follow the diet instructions given to you by Dr Roseanne Kaufman office.  Please read over the following fact sheets that you were given. Anesthesia Post-op Instructions and Care and Recovery After Surgery       Esophagogastroduodenoscopy Esophagogastroduodenoscopy (EGD) is a procedure to examine the lining of the esophagus, stomach, and first part of the small intestine (duodenum). This procedure is done to check for problems such as inflammation, bleeding, ulcers, or growths. During this procedure, a long, flexible, lighted tube with a camera attached (endoscope) is inserted down the throat. Tell a health care provider about:  Any allergies you have.  All medicines you are taking, including vitamins, herbs, eye drops, creams, and over-the-counter medicines.  Any  problems you or family members have had with anesthetic medicines.  Any blood disorders you have.  Any surgeries you have had.  Any medical conditions you have.  Whether you are pregnant or may be pregnant. What are the risks? Generally, this is a safe procedure. However, problems may occur, including:  Infection.  Bleeding.  A tear (perforation) in the esophagus, stomach, or duodenum.  Trouble breathing.  Excessive sweating.  Spasms of the larynx.  A slowed heartbeat.  Low blood pressure.  What happens before the procedure?  Follow instructions from your health care provider about eating or drinking restrictions.  Ask your health care provider about: ? Changing or stopping your regular medicines. This is especially important if you are taking diabetes medicines or blood thinners. ? Taking medicines such as aspirin and ibuprofen. These medicines can thin your blood. Do not take these medicines before your procedure if your health care provider instructs you not to.  Plan to have someone take you home after the procedure.  If you wear dentures, be ready to remove them before the procedure. What happens during the procedure?  To reduce your risk of infection, your health care team will wash or sanitize their hands.  An IV tube will be put in a vein in your hand or arm. You will get medicines and fluids through this tube.  You will be given one or more of the following: ? A medicine to help you relax (sedative). ? A medicine  to numb the area (local anesthetic). This medicine may be sprayed into your throat. It will make you feel more comfortable and keep you from gagging or coughing during the procedure. ? A medicine for pain.  A mouth guard may be placed in your mouth to protect your teeth and to keep you from biting on the endoscope.  You will be asked to lie on your left side.  The endoscope will be lowered down your throat into your esophagus, stomach, and  duodenum.  Air will be put into the endoscope. This will help your health care provider see better.  The lining of your esophagus, stomach, and duodenum will be examined.  Your health care provider may: ? Take a tissue sample so it can be looked at in a lab (biopsy). ? Remove growths. ? Remove objects (foreign bodies) that are stuck. ? Treat any bleeding with medicines or other devices that stop tissue from bleeding. ? Widen (dilate) or stretch narrowed areas of your esophagus and stomach.  The endoscope will be taken out. The procedure may vary among health care providers and hospitals. What happens after the procedure?  Your blood pressure, heart rate, breathing rate, and blood oxygen level will be monitored often until the medicines you were given have worn off.  Do not eat or drink anything until the numbing medicine has worn off and your gag reflex has returned. This information is not intended to replace advice given to you by your health care provider. Make sure you discuss any questions you have with your health care provider. Document Released: 02/15/2005 Document Revised: 03/22/2016 Document Reviewed: 09/08/2015 Elsevier Interactive Patient Education  2018 [employer]. Esophagogastroduodenoscopy, Care After Refer to this sheet in the next few weeks. These instructions provide you with information about caring for yourself after your procedure. Your health care provider may also give you more specific instructions. Your treatment has been planned according to current medical practices, but problems sometimes occur. Call your health care provider if you have any problems or questions after your procedure. What can I expect after the procedure? After the procedure, it is common to have:  A sore throat.  Nausea.  Bloating.  Dizziness.  Fatigue.  Follow these instructions at home:  Do not eat or drink anything until the numbing medicine (local anesthetic) has worn off  and your gag reflex has returned. You will know that the local anesthetic has worn off when you can swallow comfortably.  Do not drive for 24 hours if you received a medicine to help you relax (sedative).  If your health care provider took a tissue sample for testing during the procedure, make sure to get your test results. This is your responsibility. Ask your health care provider or the department performing the test when your results will be ready.  Keep all follow-up visits as told by your health care provider. This is important. Contact a health care provider if:  You cannot stop coughing.  You are not urinating.  You are urinating less than usual. Get help right away if:  You have trouble swallowing.  You cannot eat or drink.  You have throat or chest pain that gets worse.  You are dizzy or light-headed.  You faint.  You have nausea or vomiting.  You have chills.  You have a fever.  You have severe abdominal pain.  You have black, tarry, or bloody stools. This information is not intended to replace advice given to you by your health care provider.  Make sure you discuss any questions you have with your health care provider. Document Released: 10/01/2012 Document Revised: 03/22/2016 Document Reviewed: 09/08/2015 Elsevier Interactive Patient Education  2018 [place] Anesthesia is a term that refers to techniques, procedures, and medicines that help a person stay safe and comfortable during a medical procedure. Monitored anesthesia care, or sedation, is one type of anesthesia. Your anesthesia specialist may recommend sedation if you will be having a procedure that does not require you to be unconscious, such as:  Cataract surgery.  A dental procedure.  A biopsy.  A colonoscopy.  During the procedure, you may receive a medicine to help you relax (sedative). There are three levels of sedation:  Mild sedation. At this level, you may  feel awake and relaxed. You will be able to follow directions.  Moderate sedation. At this level, you will be sleepy. You may not remember the procedure.  Deep sedation. At this level, you will be asleep. You will not remember the procedure.  The more medicine you are given, the deeper your level of sedation will be. Depending on how you respond to the procedure, the anesthesia specialist may change your level of sedation or the type of anesthesia to fit your needs. An anesthesia specialist will monitor you closely during the procedure. Let your health care provider know about:  Any allergies you have.  All medicines you are taking, including vitamins, herbs, eye drops, creams, and over-the-counter medicines.  Any use of steroids (by mouth or as a cream).  Any problems you or family members have had with sedatives and anesthetic medicines.  Any blood disorders you have.  Any surgeries you have had.  Any medical conditions you have, such as sleep apnea.  Whether you are pregnant or may be pregnant.  Any use of cigarettes, alcohol, or street drugs. What are the risks? Generally, this is a safe procedure. However, problems may occur, including:  Getting too much medicine (oversedation).  Nausea.  Allergic reaction to medicines.  Trouble breathing. If this happens, a breathing tube may be used to help with breathing. It will be removed when you are awake and breathing on your own.  Heart trouble.  Lung trouble.  Before the procedure Staying hydrated Follow instructions from your health care provider about hydration, which may include:  Up to 2 hours before the procedure - you may continue to drink clear liquids, such as water, clear fruit juice, black coffee, and plain tea.  Eating and drinking restrictions Follow instructions from your health care provider about eating and drinking, which may include:  8 hours before the procedure - stop eating heavy meals or foods such  as meat, fried foods, or fatty foods.  6 hours before the procedure - stop eating light meals or foods, such as toast or cereal.  6 hours before the procedure - stop drinking milk or drinks that contain milk.  2 hours before the procedure - stop drinking clear liquids.  Medicines Ask your health care provider about:  Changing or stopping your regular medicines. This is especially important if you are taking diabetes medicines or blood thinners.  Taking medicines such as aspirin and ibuprofen. These medicines can thin your blood. Do not take these medicines before your procedure if your health care provider instructs you not to.  Tests and exams  You will have a physical exam.  You may have blood tests done to show: ? How well your kidneys and liver are working. ?  How well your blood can clot.  General instructions  Plan to have someone take you home from the hospital or clinic.  If you will be going home right after the procedure, plan to have someone with you for 24 hours.  What happens during the procedure?  Your blood pressure, heart rate, breathing, level of pain and overall condition will be monitored.  An IV tube will be inserted into one of your veins.  Your anesthesia specialist will give you medicines as needed to keep you comfortable during the procedure. This may mean changing the level of sedation.  The procedure will be performed. After the procedure  Your blood pressure, heart rate, breathing rate, and blood oxygen level will be monitored until the medicines you were given have worn off.  Do not drive for 24 hours if you received a sedative.  You may: ? Feel sleepy, clumsy, or nauseous. ? Feel forgetful about what happened after the procedure. ? Have a sore throat if you had a breathing tube during the procedure. ? Vomit. This information is not intended to replace advice given to you by your health care provider. Make sure you discuss any questions you  have with your health care provider. Document Released: 07/11/2005 Document Revised: 03/23/2016 Document Reviewed: 02/05/2016 Elsevier Interactive Patient Education  2018 [place], Care After These instructions provide you with information about caring for yourself after your procedure. Your health care provider may also give you more specific instructions. Your treatment has been planned according to current medical practices, but problems sometimes occur. Call your health care provider if you have any problems or questions after your procedure. What can I expect after the procedure? After your procedure, it is common to:  Feel sleepy for several hours.  Feel clumsy and have poor balance for several hours.  Feel forgetful about what happened after the procedure.  Have poor judgment for several hours.  Feel nauseous or vomit.  Have a sore throat if you had a breathing tube during the procedure.  Follow these instructions at home: For at least 24 hours after the procedure:   Do not: ? Participate in activities in which you could fall or become injured. ? Drive. ? Use heavy machinery. ? Drink alcohol. ? Take sleeping pills or medicines that cause drowsiness. ? Make important decisions or sign legal documents. ? Take care of children on your own.  Rest. Eating and drinking  Follow the diet that is recommended by your health care provider.  If you vomit, drink water, juice, or soup when you can drink without vomiting.  Make sure you have little or no nausea before eating solid foods. General instructions  Have a responsible adult stay with you until you are awake and alert.  Take over-the-counter and prescription medicines only as told by your health care provider.  If you smoke, do not smoke without supervision.  Keep all follow-up visits as told by your health care provider. This is important. Contact a health care provider if:  You  keep feeling nauseous or you keep vomiting.  You feel light-headed.  You develop a rash.  You have a fever. Get help right away if:  You have trouble breathing. This information is not intended to replace advice given to you by your health care provider. Make sure you discuss any questions you have with your health care provider. Document Released: 02/05/2016 Document Revised: 06/06/2016 Document Reviewed: 02/05/2016 Elsevier Interactive Patient Education  [employer].

## 2018-01-28 ENCOUNTER — Encounter (HOSPITAL_COMMUNITY)
Admission: RE | Admit: 2018-01-28 | Discharge: 2018-01-28 | Disposition: A | Discharge status: Home / Self Care | Payer: Medicaid Other | Source: Ambulatory Visit | Attending: Internal Medicine | Admitting: Internal Medicine

## 2018-01-30 ENCOUNTER — Encounter (HOSPITAL_COMMUNITY)
Admission: RE | Admit: 2018-01-30 | Discharge: 2018-01-30 | Disposition: A | Discharge status: Home / Self Care | Payer: Medicaid Other | Source: Ambulatory Visit | Attending: Internal Medicine | Admitting: Internal Medicine

## 2018-01-30 ENCOUNTER — Telehealth: Payer: Self-pay

## 2018-01-30 NOTE — Telephone Encounter (Signed)
Make sure we get his reschedule.  Make sure he follows up with pcp for URI symptoms.

## 2018-01-30 NOTE — Progress Notes (Signed)
Patient's procedure cancelled due to low grade temp of 99.5 and productive cough, noted to be green.  Dr Rick Duff and Dr Gala Romney aware.  Notified office.

## 2018-01-30 NOTE — Telephone Encounter (Signed)
Endo scheduler called office, pt was no show for pre-op today. Called pt he is on the way now to hospital for pre-op. He thought appt was at 2:45pm (appt was for 2:15pm). Tried to call endo scheduler, LMOVM.

## 2018-01-30 NOTE — Telephone Encounter (Signed)
[name] at [facility] End[facility] called office. Pt went for pre-op appt this afternoon. Low grade fever, coughing up green mucous. [name] cancelled EGD w/Propofol that was for 02/03/18.

## 2018-01-30 NOTE — Progress Notes (Signed)
Patient did not show for PAT for procedure on Monday.  Attempted to contact without success.

## 2018-01-31 ENCOUNTER — Other Ambulatory Visit: Payer: Self-pay

## 2018-01-31 DIAGNOSIS — K259 Gastric ulcer, unspecified as acute or chronic, without hemorrhage or perforation: Secondary | ICD-10-CM

## 2018-01-31 DIAGNOSIS — R1013 Epigastric pain: Secondary | ICD-10-CM

## 2018-01-31 DIAGNOSIS — K861 Other chronic pancreatitis: Secondary | ICD-10-CM

## 2018-01-31 NOTE — Telephone Encounter (Signed)
Called pt, EGD w/Propofol w/RMR rescheduled to 03/13/18 at 2:30pm (he requested late afternoon). Instructions mailed. Orders entered. He will be seeing his PCP next week.

## 2018-01-31 NOTE — Telephone Encounter (Signed)
Called and informed pt of pre-op appt 03/06/18 at 2:45pm. Letter mailed with procedure instructions.

## 2018-02-03 ENCOUNTER — Ambulatory Visit (HOSPITAL_COMMUNITY): Admission: RE | Admit: 2018-02-03 | Payer: Medicaid Other | Source: Ambulatory Visit | Admitting: Internal Medicine

## 2018-02-03 ENCOUNTER — Encounter (HOSPITAL_COMMUNITY): Admission: RE | Payer: Self-pay | Source: Ambulatory Visit

## 2018-02-03 SURGERY — ESOPHAGOGASTRODUODENOSCOPY (EGD) WITH PROPOFOL
Anesthesia: Monitor Anesthesia Care

## 2018-03-03 NOTE — Patient Instructions (Signed)
[name]  03/03/2018     @PREFPERIOPPHARMACY @   Your procedure is scheduled on  03/13/2018 .  Report to [facility] at   1230   P.M.  Call this number if you have problems the morning of surgery:  [phone]   Remember:  Do not eat food or drink liquids after midnight.  Take these medicines the morning of surgery with A SIP OF WATER  Tegretol, keppra, lisinopril, oxycodone, protonix. Use your inhaler and nebulizer before you come. Bring your rescue inhaler with you if you have one.   Do not wear jewelry, make-up or nail polish.  Do not wear lotions, powders, or perfumes, or deodorant.  Do not shave 48 hours prior to surgery.  Men may shave face and neck.  Do not bring valuables to the hospital.  [facility] is not responsible for any belongings or valuables.  Contacts, dentures or bridgework may not be worn into surgery.  Leave your suitcase in the car.  After surgery it may be brought to your room.  For patients admitted to the hospital, discharge time will be determined by your treatment team.  Patients discharged the day of surgery will not be allowed to drive home.   Name and phone number of your driver:   family Special instructions:  Follow the diet instructions given to you by Dr Roseanne Kaufman office.  Please read over the following fact sheets that you were given. Anesthesia Post-op Instructions and Care and Recovery After Surgery       Esophagogastroduodenoscopy Esophagogastroduodenoscopy (EGD) is a procedure to examine the lining of the esophagus, stomach, and first part of the small intestine (duodenum). This procedure is done to check for problems such as inflammation, bleeding, ulcers, or growths. During this procedure, a long, flexible, lighted tube with a camera attached (endoscope) is inserted down the throat. Tell a health care provider about:  Any allergies you have.  All medicines you are taking, including vitamins, herbs, eye drops,  creams, and over-the-counter medicines.  Any problems you or family members have had with anesthetic medicines.  Any blood disorders you have.  Any surgeries you have had.  Any medical conditions you have.  Whether you are pregnant or may be pregnant. What are the risks? Generally, this is a safe procedure. However, problems may occur, including:  Infection.  Bleeding.  A tear (perforation) in the esophagus, stomach, or duodenum.  Trouble breathing.  Excessive sweating.  Spasms of the larynx.  A slowed heartbeat.  Low blood pressure.  What happens before the procedure?  Follow instructions from your health care provider about eating or drinking restrictions.  Ask your health care provider about: ? Changing or stopping your regular medicines. This is especially important if you are taking diabetes medicines or blood thinners. ? Taking medicines such as aspirin and ibuprofen. These medicines can thin your blood. Do not take these medicines before your procedure if your health care provider instructs you not to.  Plan to have someone take you home after the procedure.  If you wear dentures, be ready to remove them before the procedure. What happens during the procedure?  To reduce your risk of infection, your health care team will wash or sanitize their hands.  An IV tube will be put in a vein in your hand or arm. You will get medicines and fluids through this tube.  You will be given one or more of the following: ?  A medicine to help you relax (sedative). ? A medicine to numb the area (local anesthetic). This medicine may be sprayed into your throat. It will make you feel more comfortable and keep you from gagging or coughing during the procedure. ? A medicine for pain.  A mouth guard may be placed in your mouth to protect your teeth and to keep you from biting on the endoscope.  You will be asked to lie on your left side.  The endoscope will be lowered down your  throat into your esophagus, stomach, and duodenum.  Air will be put into the endoscope. This will help your health care provider see better.  The lining of your esophagus, stomach, and duodenum will be examined.  Your health care provider may: ? Take a tissue sample so it can be looked at in a lab (biopsy). ? Remove growths. ? Remove objects (foreign bodies) that are stuck. ? Treat any bleeding with medicines or other devices that stop tissue from bleeding. ? Widen (dilate) or stretch narrowed areas of your esophagus and stomach.  The endoscope will be taken out. The procedure may vary among health care providers and hospitals. What happens after the procedure?  Your blood pressure, heart rate, breathing rate, and blood oxygen level will be monitored often until the medicines you were given have worn off.  Do not eat or drink anything until the numbing medicine has worn off and your gag reflex has returned. This information is not intended to replace advice given to you by your health care provider. Make sure you discuss any questions you have with your health care provider. Document Released: 02/15/2005 Document Revised: 03/22/2016 Document Reviewed: 09/08/2015 Elsevier Interactive Patient Education  2018 [employer]. Esophagogastroduodenoscopy, Care After Refer to this sheet in the next few weeks. These instructions provide you with information about caring for yourself after your procedure. Your health care provider may also give you more specific instructions. Your treatment has been planned according to current medical practices, but problems sometimes occur. Call your health care provider if you have any problems or questions after your procedure. What can I expect after the procedure? After the procedure, it is common to have:  A sore throat.  Nausea.  Bloating.  Dizziness.  Fatigue.  Follow these instructions at home:  Do not eat or drink anything until the numbing  medicine (local anesthetic) has worn off and your gag reflex has returned. You will know that the local anesthetic has worn off when you can swallow comfortably.  Do not drive for 24 hours if you received a medicine to help you relax (sedative).  If your health care provider took a tissue sample for testing during the procedure, make sure to get your test results. This is your responsibility. Ask your health care provider or the department performing the test when your results will be ready.  Keep all follow-up visits as told by your health care provider. This is important. Contact a health care provider if:  You cannot stop coughing.  You are not urinating.  You are urinating less than usual. Get help right away if:  You have trouble swallowing.  You cannot eat or drink.  You have throat or chest pain that gets worse.  You are dizzy or light-headed.  You faint.  You have nausea or vomiting.  You have chills.  You have a fever.  You have severe abdominal pain.  You have black, tarry, or bloody stools. This information is not intended to  replace advice given to you by your health care provider. Make sure you discuss any questions you have with your health care provider. Document Released: 10/01/2012 Document Revised: 03/22/2016 Document Reviewed: 09/08/2015 Elsevier Interactive Patient Education  2018 [place] Anesthesia is a term that refers to techniques, procedures, and medicines that help a person stay safe and comfortable during a medical procedure. Monitored anesthesia care, or sedation, is one type of anesthesia. Your anesthesia specialist may recommend sedation if you will be having a procedure that does not require you to be unconscious, such as:  Cataract surgery.  A dental procedure.  A biopsy.  A colonoscopy.  During the procedure, you may receive a medicine to help you relax (sedative). There are three levels of  sedation:  Mild sedation. At this level, you may feel awake and relaxed. You will be able to follow directions.  Moderate sedation. At this level, you will be sleepy. You may not remember the procedure.  Deep sedation. At this level, you will be asleep. You will not remember the procedure.  The more medicine you are given, the deeper your level of sedation will be. Depending on how you respond to the procedure, the anesthesia specialist may change your level of sedation or the type of anesthesia to fit your needs. An anesthesia specialist will monitor you closely during the procedure. Let your health care provider know about:  Any allergies you have.  All medicines you are taking, including vitamins, herbs, eye drops, creams, and over-the-counter medicines.  Any use of steroids (by mouth or as a cream).  Any problems you or family members have had with sedatives and anesthetic medicines.  Any blood disorders you have.  Any surgeries you have had.  Any medical conditions you have, such as sleep apnea.  Whether you are pregnant or may be pregnant.  Any use of cigarettes, alcohol, or street drugs. What are the risks? Generally, this is a safe procedure. However, problems may occur, including:  Getting too much medicine (oversedation).  Nausea.  Allergic reaction to medicines.  Trouble breathing. If this happens, a breathing tube may be used to help with breathing. It will be removed when you are awake and breathing on your own.  Heart trouble.  Lung trouble.  Before the procedure Staying hydrated Follow instructions from your health care provider about hydration, which may include:  Up to 2 hours before the procedure - you may continue to drink clear liquids, such as water, clear fruit juice, black coffee, and plain tea.  Eating and drinking restrictions Follow instructions from your health care provider about eating and drinking, which may include:  8 hours before  the procedure - stop eating heavy meals or foods such as meat, fried foods, or fatty foods.  6 hours before the procedure - stop eating light meals or foods, such as toast or cereal.  6 hours before the procedure - stop drinking milk or drinks that contain milk.  2 hours before the procedure - stop drinking clear liquids.  Medicines Ask your health care provider about:  Changing or stopping your regular medicines. This is especially important if you are taking diabetes medicines or blood thinners.  Taking medicines such as aspirin and ibuprofen. These medicines can thin your blood. Do not take these medicines before your procedure if your health care provider instructs you not to.  Tests and exams  You will have a physical exam.  You may have blood tests done to show: ?  How well your kidneys and liver are working. ? How well your blood can clot.  General instructions  Plan to have someone take you home from the hospital or clinic.  If you will be going home right after the procedure, plan to have someone with you for 24 hours.  What happens during the procedure?  Your blood pressure, heart rate, breathing, level of pain and overall condition will be monitored.  An IV tube will be inserted into one of your veins.  Your anesthesia specialist will give you medicines as needed to keep you comfortable during the procedure. This may mean changing the level of sedation.  The procedure will be performed. After the procedure  Your blood pressure, heart rate, breathing rate, and blood oxygen level will be monitored until the medicines you were given have worn off.  Do not drive for 24 hours if you received a sedative.  You may: ? Feel sleepy, clumsy, or nauseous. ? Feel forgetful about what happened after the procedure. ? Have a sore throat if you had a breathing tube during the procedure. ? Vomit. This information is not intended to replace advice given to you by your health  care provider. Make sure you discuss any questions you have with your health care provider. Document Released: 07/11/2005 Document Revised: 03/23/2016 Document Reviewed: 02/05/2016 Elsevier Interactive Patient Education  2018 [place], Care After These instructions provide you with information about caring for yourself after your procedure. Your health care provider may also give you more specific instructions. Your treatment has been planned according to current medical practices, but problems sometimes occur. Call your health care provider if you have any problems or questions after your procedure. What can I expect after the procedure? After your procedure, it is common to:  Feel sleepy for several hours.  Feel clumsy and have poor balance for several hours.  Feel forgetful about what happened after the procedure.  Have poor judgment for several hours.  Feel nauseous or vomit.  Have a sore throat if you had a breathing tube during the procedure.  Follow these instructions at home: For at least 24 hours after the procedure:   Do not: ? Participate in activities in which you could fall or become injured. ? Drive. ? Use heavy machinery. ? Drink alcohol. ? Take sleeping pills or medicines that cause drowsiness. ? Make important decisions or sign legal documents. ? Take care of children on your own.  Rest. Eating and drinking  Follow the diet that is recommended by your health care provider.  If you vomit, drink water, juice, or soup when you can drink without vomiting.  Make sure you have little or no nausea before eating solid foods. General instructions  Have a responsible adult stay with you until you are awake and alert.  Take over-the-counter and prescription medicines only as told by your health care provider.  If you smoke, do not smoke without supervision.  Keep all follow-up visits as told by your health care provider. This is  important. Contact a health care provider if:  You keep feeling nauseous or you keep vomiting.  You feel light-headed.  You develop a rash.  You have a fever. Get help right away if:  You have trouble breathing. This information is not intended to replace advice given to you by your health care provider. Make sure you discuss any questions you have with your health care provider. Document Released: 02/05/2016 Document Revised: 06/06/2016 Document Reviewed: 02/05/2016  Chartered certified [employer] Patient Education  [employer].

## 2018-03-06 ENCOUNTER — Encounter (HOSPITAL_COMMUNITY)
Admission: RE | Admit: 2018-03-06 | Discharge: 2018-03-06 | Disposition: A | Discharge status: Home / Self Care | Payer: Medicaid Other | Source: Ambulatory Visit | Attending: Internal Medicine | Admitting: Internal Medicine

## 2018-03-11 ENCOUNTER — Encounter (HOSPITAL_COMMUNITY): Payer: Self-pay

## 2018-03-11 ENCOUNTER — Encounter (HOSPITAL_COMMUNITY)
Admission: RE | Admit: 2018-03-11 | Discharge: 2018-03-11 | Disposition: A | Discharge status: Home / Self Care | Payer: Medicaid Other | Source: Ambulatory Visit | Attending: Internal Medicine | Admitting: Internal Medicine

## 2018-03-11 DIAGNOSIS — Z01812 Encounter for preprocedural laboratory examination: Secondary | ICD-10-CM | POA: Insufficient documentation

## 2018-03-11 LAB — BASIC METABOLIC PANEL
Anion gap: 11 (ref 5–15)
CHLORIDE: 89 mmol/L — AB (ref 101–111)
CO2: 27 mmol/L (ref 22–32)
Calcium: 9.2 mg/dL (ref 8.9–10.3)
Creatinine, Ser: 0.39 mg/dL — ABNORMAL LOW (ref 0.61–1.24)
GFR calc Af Amer: >60 mL/min (ref >60)
GFR calc non Af Amer: >60 mL/min (ref >60)
Glucose, Bld: 106 mg/dL — ABNORMAL HIGH (ref 65–99)
POTASSIUM: 4 mmol/L (ref 3.5–5.1)
SODIUM: 127 mmol/L — AB (ref 135–145)

## 2018-03-11 LAB — CBC WITH DIFFERENTIAL/PLATELET
Basophils Absolute: 0 10*3/uL (ref 0.0–0.1)
Basophils Relative: 1 %
EOS PCT: 5 %
Eosinophils Absolute: 0.3 10*3/uL (ref 0.0–0.7)
HCT: 41.3 % (ref 39.0–52.0)
HEMOGLOBIN: 14.1 g/dL (ref 13.0–17.0)
LYMPHS ABS: 2.1 10*3/uL (ref 0.7–4.0)
LYMPHS PCT: 44 %
MCH: 33.7 pg (ref 26.0–34.0)
MCHC: 34.1 g/dL (ref 30.0–36.0)
MCV: 98.8 fL (ref 78.0–100.0)
Monocytes Absolute: 1 10*3/uL (ref 0.1–1.0)
Monocytes Relative: 21 %
NEUTROS PCT: 29 %
Neutro Abs: 1.4 10*3/uL — ABNORMAL LOW (ref 1.7–7.7)
Platelets: 196 10*3/uL (ref 150–400)
RBC: 4.18 MIL/uL — AB (ref 4.22–5.81)
RDW: 12.6 % (ref 11.5–15.5)
WBC: 4.8 10*3/uL (ref 4.0–10.5)

## 2018-03-13 ENCOUNTER — Ambulatory Visit (HOSPITAL_COMMUNITY): Payer: Medicaid Other | Admitting: Anesthesiology

## 2018-03-13 ENCOUNTER — Encounter (HOSPITAL_COMMUNITY): Payer: Self-pay | Admitting: *Deleted

## 2018-03-13 ENCOUNTER — Ambulatory Visit (HOSPITAL_COMMUNITY)
Admission: RE | Admit: 2018-03-13 | Discharge: 2018-03-13 | Disposition: A | Discharge status: Home / Self Care | Payer: Medicaid Other | Source: Ambulatory Visit | Attending: Internal Medicine | Admitting: Internal Medicine

## 2018-03-13 ENCOUNTER — Encounter (HOSPITAL_COMMUNITY)
Admission: RE | Disposition: A | Discharge status: Home / Self Care | Payer: Self-pay | Source: Ambulatory Visit | Attending: Internal Medicine

## 2018-03-13 DIAGNOSIS — K259 Gastric ulcer, unspecified as acute or chronic, without hemorrhage or perforation: Secondary | ICD-10-CM

## 2018-03-13 DIAGNOSIS — R569 Unspecified convulsions: Secondary | ICD-10-CM | POA: Insufficient documentation

## 2018-03-13 DIAGNOSIS — F17210 Nicotine dependence, cigarettes, uncomplicated: Secondary | ICD-10-CM | POA: Diagnosis not present

## 2018-03-13 DIAGNOSIS — J449 Chronic obstructive pulmonary disease, unspecified: Secondary | ICD-10-CM | POA: Insufficient documentation

## 2018-03-13 DIAGNOSIS — I10 Essential (primary) hypertension: Secondary | ICD-10-CM | POA: Diagnosis not present

## 2018-03-13 DIAGNOSIS — F1010 Alcohol abuse, uncomplicated: Secondary | ICD-10-CM | POA: Insufficient documentation

## 2018-03-13 DIAGNOSIS — K861 Other chronic pancreatitis: Secondary | ICD-10-CM

## 2018-03-13 DIAGNOSIS — K219 Gastro-esophageal reflux disease without esophagitis: Secondary | ICD-10-CM | POA: Insufficient documentation

## 2018-03-13 DIAGNOSIS — R1013 Epigastric pain: Secondary | ICD-10-CM

## 2018-03-13 DIAGNOSIS — Z79899 Other long term (current) drug therapy: Secondary | ICD-10-CM | POA: Diagnosis not present

## 2018-03-13 DIAGNOSIS — Z800 Family history of malignant neoplasm of digestive organs: Secondary | ICD-10-CM | POA: Insufficient documentation

## 2018-03-13 DIAGNOSIS — K279 Peptic ulcer, site unspecified, unspecified as acute or chronic, without hemorrhage or perforation: Secondary | ICD-10-CM | POA: Diagnosis present

## 2018-03-13 HISTORY — PX: ESOPHAGOGASTRODUODENOSCOPY (EGD) WITH PROPOFOL: SHX5813

## 2018-03-13 SURGERY — ESOPHAGOGASTRODUODENOSCOPY (EGD) WITH PROPOFOL
Anesthesia: Monitor Anesthesia Care

## 2018-03-13 MED ORDER — PROPOFOL 10 MG/ML IV BOLUS
INTRAVENOUS | Status: DC | PRN
  Administered 2018-03-13: 50 mg via INTRAVENOUS
  Administered 2018-03-13: 80 mg via INTRAVENOUS

## 2018-03-13 MED ORDER — LACTATED RINGERS IV SOLN: INTRAVENOUS | Status: DC

## 2018-03-13 MED ORDER — CHLORHEXIDINE GLUCONATE CLOTH 2 % EX PADS: 6.0000 | MEDICATED_PAD | Freq: Once | CUTANEOUS | Status: DC

## 2018-03-13 MED ORDER — LACTATED RINGERS IV SOLN
INTRAVENOUS | Status: DC | PRN
  Administered 2018-03-13: 16:00:00 via INTRAVENOUS

## 2018-03-13 MED ORDER — MIDAZOLAM HCL 5 MG/5ML IJ SOLN
INTRAMUSCULAR | Status: DC | PRN
  Administered 2018-03-13: 2 mg via INTRAVENOUS

## 2018-03-13 NOTE — H&P (Signed)
@LOGO @   Primary Care Physician:  Jani Gravel, MD Primary Gastroenterologist:  Dr. Gala Romney  Pre-Procedure History & Physical: HPI:  [name] is a 42 y.o. male here for for surveillance EGD. History of significant gastric ulceration seen last year biopsies negative for malignancy or H. pylori.  Past Medical History:  Diagnosis Date  . Back pain   . Bradycardia 08/17/2011  . COPD (chronic obstructive pulmonary disease) ([place])   . ETOH abuse   . GERD (gastroesophageal reflux disease)   . Macrocytosis 08/17/2011  . Pancreatitis   . Pneumonia   . SAH (subarachnoid hemorrhage) ([place])   . Seizures ([place])    last one 6 mos ago    Past Surgical History:  Procedure Laterality Date  . BACK SURGERY    . ESOPHAGOGASTRODUODENOSCOPY (EGD) WITH PROPOFOL N/A 08/14/2017   Procedure: ESOPHAGOGASTRODUODENOSCOPY (EGD) WITH PROPOFOL;  Surgeon: Daneil Dolin, MD;  Location: [facility];  Service: Endoscopy;  Laterality: N/A;  1:30pm  . SEPTOPLASTY      Prior to Admission medications   Medication Sig Start Date End Date Taking? Authorizing Provider  albuterol (PROVENTIL HFA;VENTOLIN HFA) 108 (90 BASE) MCG/ACT inhaler Inhale 2 puffs into the lungs every 6 (six) hours as needed for wheezing or shortness of breath.   Yes [provider]  albuterol (PROVENTIL) (2.5 MG/3ML) 0.083% nebulizer solution Take 3 mLs (2.5 mg total) by nebulization every 4 (four) hours as needed for wheezing or shortness of breath. 09/09/13  Yes Pollina, Gwenyth Allegra, MD  carbamazepine (TEGRETOL) 200 MG tablet Take 400 mg by mouth 2 (two) times daily.   Yes [provider]  levETIRAcetam (KEPPRA) 500 MG tablet Take 500 mg by mouth 2 (two) times daily.    Yes [provider]  lisinopril (PRINIVIL,ZESTRIL) 20 MG tablet Take 20 mg by mouth daily.   Yes [provider]  Oxycodone HCl 10 MG TABS Take 10 mg by mouth 2 (two) times daily. My take an additional 5 mg mid day as needed for pain    Yes [provider]  pantoprazole (PROTONIX) 40 MG tablet Take 1 tablet (40 mg total) by mouth 2 (two) times daily before a meal. 08/12/17  Yes Mahala Menghini, PA-C  tiotropium (SPIRIVA) 18 MCG inhalation capsule Place 18 mcg into inhaler and inhale daily.   Yes [provider]  Pancrelipase, Lip-Prot-Amyl, (ZENPEP) 40000-126000 units CPEP Take 2 capsules by mouth 3 (three) times daily with meals. Take 1 capsule with snacks. Patient not taking: Reported on 01/21/2018 12/31/17   Mahala Menghini, PA-C    Allergies as of 01/31/2018  . (No Known Allergies)    Family History  Problem Relation Age of Onset  . Coronary artery disease Mother        deceased age 55  . Seizures Father   . Alcohol abuse Father   . Colon cancer Neg Hx     Social History   Socioeconomic History  . Marital status: Single    Spouse name: Not on file  . Number of children: Not on file  . Years of education: Not on file  . Highest education level: Not on file  Occupational History  . Not on file  Social Needs  . Financial resource strain: Not on file  . Food insecurity:    Worry: Not on file    Inability: Not on file  . Transportation needs:    Medical: Not on file    Non-medical: Not on file  Tobacco  Use  . Smoking status: Current Every Day Smoker    Packs/day: 1.00    Years: 20.00    Pack years: 20.00    Types: Cigarettes  . Smokeless tobacco: Never Used  Substance and Sexual Activity  . Alcohol use: Yes    Comment: 12 pack of beer daily.  . Drug use: Yes    Types: Marijuana    Comment: months   . Sexual activity: Not Currently  Lifestyle  . Physical activity:    Days per week: Not on file    Minutes per session: Not on file  . Stress: Not on file  Relationships  . Social connections:    Talks on phone: Not on file    Gets together: Not on file    Attends religious service: Not on file    Active member of club or organization: Not on file    Attends meetings of clubs or  organizations: Not on file    Relationship status: Not on file  . Intimate partner violence:    Fear of current or ex partner: Not on file    Emotionally abused: Not on file    Physically abused: Not on file    Forced sexual activity: Not on file  Other Topics Concern  . Not on file  Social History Narrative  . Not on file    Review of Systems: See HPI, otherwise negative ROS  Physical Exam: BP 126/90   Pulse 78   Temp 97.7 F (36.5 C) (Oral)   Resp 16   SpO2 95%  General:   Alert,  Well-developed, well-nourished, pleasant and cooperative in NAD SNeck:  Supple; no masses or thyromegaly. No significant cervical adenopathy. Lungs:  Clear throughout to auscultation.   No wheezes, crackles, or rhonchi. No acute distress. Heart:  Regular rate and rhythm; no murmurs, clicks, rubs,  or gallops. Abdomen: Non-distended, normal bowel sounds.  Soft and nontender without appreciable mass or hepatosplenomegaly.  Pulses:  Normal pulses noted. Extremities:  Without clubbing or edema.  Impression:  Pleasant 42 year old gentleman with history significant gastric ulcer disease noted last fall. He is here for surveillance examination from 2 assist for healing.  Recommendations:  I have offered the patient a surveillance EGD today per plan.  The risks, benefits, limitations, alternatives and imponderables have been reviewed with the patient. Potential for esophageal dilation, biopsy, etc. have also been reviewed.  Questions have been answered. All parties agreeable.   Notice: This dictation was prepared with Dragon dictation along with smaller phrase technology. Any transcriptional errors that result from this process are unintentional and may not be corrected upon review.

## 2018-03-13 NOTE — Anesthesia Postprocedure Evaluation (Signed)
Anesthesia Post Note  Patient: [name]  Procedure(s) Performed: ESOPHAGOGASTRODUODENOSCOPY (EGD) WITH PROPOFOL (N/A )  Patient location during evaluation: PACU Anesthesia Type: MAC Level of consciousness: awake and alert Pain management: pain level controlled Vital Signs Assessment: post-procedure vital signs reviewed and stable Respiratory status: spontaneous breathing, nonlabored ventilation, respiratory function stable and patient connected to nasal cannula oxygen Cardiovascular status: stable and blood pressure returned to baseline Postop Assessment: no apparent nausea or vomiting Anesthetic complications: no     Last Vitals:  Vitals:   03/13/18 1359 03/13/18 1557  BP: 126/90 130/78  Pulse: 78 80  Resp: 16 11  Temp: 36.5 C 36.9 C  SpO2: 95% 95%    Last Pain:  Vitals:   03/13/18 1542  TempSrc:   PainSc: [place]

## 2018-03-13 NOTE — Transfer of Care (Signed)
Immediate Anesthesia Transfer of Care Note  Patient: [name]  Procedure(s) Performed: ESOPHAGOGASTRODUODENOSCOPY (EGD) WITH PROPOFOL (N/A )  Patient Location: PACU  Anesthesia Type:MAC  Level of Consciousness: awake, alert  and oriented  Airway & Oxygen Therapy: Patient Spontanous Breathing  Post-op Assessment: Report given to RN and Post -op Vital signs reviewed and stable  Post vital signs: Reviewed and stable  Last Vitals:  Vitals Value Taken Time  BP 130/78 03/13/2018  3:57 PM  Temp    Pulse 80 03/13/2018  3:58 PM  Resp 11 03/13/2018  3:58 PM  SpO2 95 % 03/13/2018  3:58 PM  Vitals shown include unvalidated device data.  Last Pain:  Vitals:   03/13/18 1542  TempSrc:   PainSc: 7       Patients Stated Pain Goal: 7 (24/23/53 6144)  Complications: No apparent anesthesia complications

## 2018-03-13 NOTE — Anesthesia Preprocedure Evaluation (Signed)
Anesthesia Evaluation  Patient identified by MRN, date of birth, ID band Patient awake    Reviewed: Allergy & Precautions, H&P , NPO status , Patient's Chart, lab work & pertinent test results, reviewed documented beta blocker date and time   Airway Mallampati: II  TM Distance: >3 FB Neck ROM: full    Dental no notable dental hx. (+) Missing, Chipped, Poor Dentition, Edentulous Upper   Pulmonary neg pulmonary ROS, pneumonia, COPD, Current Smoker,    Pulmonary exam normal breath sounds clear to auscultation       Cardiovascular Exercise Tolerance: Good hypertension, negative cardio ROS   Rhythm:regular Rate:Normal     Neuro/Psych Seizures -,  PSYCHIATRIC DISORDERS negative neurological ROS  negative psych ROS   GI/Hepatic negative GI ROS, Neg liver ROS, PUD, GERD  ,  Endo/Other  negative endocrine ROS  Renal/GU negative Renal ROS  negative genitourinary   Musculoskeletal   Abdominal   Peds  Hematology negative hematology ROS (+)   Anesthesia Other Findings Acute alcoholic pancreatitis Alcohol abuse, continuous H/O SAH (subarachnoid hemorrhage) ([facility])   Reproductive/Obstetrics negative OB ROS                             Anesthesia Physical Anesthesia Plan  ASA: III  Anesthesia Plan: MAC   Post-op Pain Management:    Induction:   PONV Risk Score and Plan:   Airway Management Planned:   Additional Equipment:   Intra-op Plan:   Post-operative Plan:   Informed Consent: I have reviewed the patients History and Physical, chart, labs and discussed the procedure including the risks, benefits and alternatives for the proposed anesthesia with the patient or authorized representative who has indicated his/her understanding and acceptance.   Dental Advisory Given  Plan Discussed with: CRNA  Anesthesia Plan Comments:         Anesthesia Quick Evaluation

## 2018-03-13 NOTE — Discharge Instructions (Signed)
EGD Discharge instructions Please read the instructions outlined below and refer to this sheet in the next few weeks. These discharge instructions provide you with general information on caring for yourself after you leave the hospital. Your doctor may also give you specific instructions. While your treatment has been planned according to the most current medical practices available, unavoidable complications occasionally occur. If you have any problems or questions after discharge, please call your doctor. ACTIVITY  You may resume your regular activity but move at a slower pace for the next 24 hours.   Take frequent rest periods for the next 24 hours.   Walking will help expel (get rid of) the air and reduce the bloated feeling in your abdomen.   No driving for 24 hours (because of the anesthesia (medicine) used during the test).   You may shower.   Do not sign any important legal documents or operate any machinery for 24 hours (because of the anesthesia used during the test).  NUTRITION  Drink plenty of fluids.   You may resume your normal diet.   Begin with a light meal and progress to your normal diet.   Avoid alcoholic beverages for 24 hours or as instructed by your caregiver.  MEDICATIONS  You may resume your normal medications unless your caregiver tells you otherwise.  WHAT YOU CAN EXPECT TODAY  You may experience abdominal discomfort such as a feeling of fullness or gas pains.  FOLLOW-UP  Your doctor will discuss the results of your test with you.  SEEK IMMEDIATE MEDICAL ATTENTION IF ANY OF THE FOLLOWING OCCUR:  Excessive nausea (feeling sick to your stomach) and/or vomiting.   Severe abdominal pain and distention (swelling).   Trouble swallowing.   Temperature over 101 F (37.8 C).   Rectal bleeding or vomiting of blood.    Continue Protonix 40 mg twice daily  Continue to avoid NSAIDs including ibuprofen as much possible  Office visit with [place] in 3  months    PATIENT INSTRUCTIONS POST-ANESTHESIA  IMMEDIATELY FOLLOWING SURGERY:  Do not drive or operate machinery for the first twenty four hours after surgery.  Do not make any important decisions for twenty four hours after surgery or while taking narcotic pain medications or sedatives.  If you develop intractable nausea and vomiting or a severe headache please notify your doctor immediately.  FOLLOW-UP:  Please make an appointment with your surgeon as instructed. You do not need to follow up with anesthesia unless specifically instructed to do so.  WOUND CARE INSTRUCTIONS (if applicable):  Keep a dry clean dressing on the anesthesia/puncture wound site if there is drainage.  Once the wound has quit draining you may leave it open to air.  Generally you should leave the bandage intact for twenty four hours unless there is drainage.  If the epidural site drains for more than 36-48 hours please call the anesthesia department.  QUESTIONS?:  Please feel free to call your physician or the hospital operator if you have any questions, and they will be happy to assist you.

## 2018-03-19 ENCOUNTER — Encounter: Payer: Self-pay | Admitting: Gastroenterology

## 2018-03-21 NOTE — Op Note (Signed)
[facility] Patient Name: [name] Procedure Date: 03/13/2018 3:32 PM MRN: [id number] Date of Birth: 09/24/1976 Attending MD: Norvel Richards , MD CSN: [id number] Age: 42 Admit Type: Outpatient Procedure:                Upper GI endoscopy Indications:              Surveillance procedure, Follow-up of peptic ulcer Providers:                Norvel Richards, MD, Janeece Riggers, RN, Jeanann Lewandowsky.                            Sharon Seller, RN Referring MD:              Medicines:                Propofol per Anesthesia Complications:            No immediate complications. Estimated Blood Loss:     Estimated blood loss: none. Procedure:                Pre-Anesthesia Assessment:                           - Prior to the procedure, a History and Physical                            was performed, and patient medications and                            allergies were reviewed. The patient's tolerance of                            previous anesthesia was also reviewed. The risks                            and benefits of the procedure and the sedation                            options and risks were discussed with the patient.                            All questions were answered, and informed consent                            was obtained. Prior Anticoagulants: The patient has                            taken no previous anticoagulant or antiplatelet                            agents. ASA Grade Assessment: II - A patient with                            mild systemic disease. After reviewing the risks  and benefits, the patient was deemed in                            satisfactory condition to undergo the procedure.                           After obtaining informed consent, the endoscope was                            passed under direct vision. Throughout the                            procedure, the patient's blood pressure, pulse, and       oxygen saturations were monitored continuously. The                            EG-[id number] ([id number]) scope was introduced through the                            mouth, and advanced to the second part of duodenum.                            The upper GI endoscopy was accomplished without                            difficulty. The patient tolerated the procedure                            well. Scope In: 3:47:21 PM Scope Out: 3:50:45 PM Total Procedure Duration: 0 hours 3 minutes 24 seconds  Findings:      The examined esophagus was normal. gastric cavity normal except for       nearly completely healed 3 mm antral prepyloric ulcer.      The duodenal bulb and second portion of the duodenum were normal. Impression:               - Normal esophagus.                           - Normal duodenal bulb and second portion of the                            duodenum. Nearly completely healed prepyloric                            gastric ulcer.                           - No specimens collected.                           - Moderate Sedation:      Moderate (conscious) sedation was personally administered by an       anesthesia professional. The following parameters were monitored: oxygen       saturation, heart rate, blood pressure, respiratory rate, EKG, adequacy  of pulmonary ventilation, and response to care. Total physician       intraservice time was 10 minutes. Recommendation:           - Patient has a contact number available for                            emergencies. The signs and symptoms of potential                            delayed complications were discussed with the                            patient. Return to normal activities tomorrow.                            Written discharge instructions were provided to the                            patient.                           - Resume previous diet. Continue Protonix 40 mg                            twice daily.                            - Continue present medications. Avoid nonsteroidal                            agents. Consider alcohol cessation..                           - No repeat upper endoscopy.                           - Return to GI office in 3 months. Procedure Code(s):        --- Professional ---                           [phone], Esophagogastroduodenoscopy, flexible,                            transoral; diagnostic, including collection of                            specimen(s) by brushing or washing, when performed                            (separate procedure) Diagnosis Code(s):        --- Professional ---                           K27.9, Peptic ulcer, site unspecified, unspecified  as acute or chronic, without hemorrhage or                            perforation CPT copyright 2017 American Medical Association. All rights reserved. The codes documented in this report are preliminary and upon coder review may  be revised to meet current compliance requirements. [name]. [name], MD Norvel Richards, MD 03/21/2018 9:19:48 AM This report has been signed electronically. Number of Addenda: 0

## 2018-03-25 ENCOUNTER — Encounter (HOSPITAL_COMMUNITY): Payer: Self-pay | Admitting: Internal Medicine

## 2018-06-17 ENCOUNTER — Ambulatory Visit: Payer: Medicaid Other | Admitting: Gastroenterology

## 2018-06-20 ENCOUNTER — Ambulatory Visit: Payer: Medicaid Other | Admitting: Gastroenterology

## 2018-06-20 ENCOUNTER — Telehealth: Payer: Self-pay | Admitting: Internal Medicine

## 2018-06-20 ENCOUNTER — Encounter: Payer: Self-pay | Admitting: Internal Medicine

## 2018-06-20 NOTE — Telephone Encounter (Signed)
PATIENT WAS A NO SHOW AND LETTER SENT  °

## 2018-09-24 ENCOUNTER — Encounter (HOSPITAL_COMMUNITY): Payer: Self-pay

## 2018-09-24 ENCOUNTER — Emergency Department (HOSPITAL_COMMUNITY): Payer: Medicaid Other

## 2018-09-24 ENCOUNTER — Other Ambulatory Visit: Payer: Self-pay

## 2018-09-24 ENCOUNTER — Emergency Department (HOSPITAL_COMMUNITY)
Admission: EM | Admit: 2018-09-24 | Discharge: 2018-09-24 | Disposition: A | Discharge status: Home / Self Care | Payer: Medicaid Other | Attending: Emergency Medicine | Admitting: Emergency Medicine

## 2018-09-24 DIAGNOSIS — M5442 Lumbago with sciatica, left side: Secondary | ICD-10-CM | POA: Diagnosis not present

## 2018-09-24 DIAGNOSIS — Z79899 Other long term (current) drug therapy: Secondary | ICD-10-CM | POA: Diagnosis not present

## 2018-09-24 DIAGNOSIS — I10 Essential (primary) hypertension: Secondary | ICD-10-CM | POA: Diagnosis not present

## 2018-09-24 DIAGNOSIS — J449 Chronic obstructive pulmonary disease, unspecified: Secondary | ICD-10-CM | POA: Insufficient documentation

## 2018-09-24 DIAGNOSIS — F17210 Nicotine dependence, cigarettes, uncomplicated: Secondary | ICD-10-CM | POA: Diagnosis not present

## 2018-09-24 MED ORDER — LIDOCAINE 5 % EX PTCH: 1.0000 | MEDICATED_PATCH | CUTANEOUS | 0 refills | Status: DC

## 2018-09-24 MED ORDER — METHOCARBAMOL 750 MG PO TABS: 750.0000 mg | ORAL_TABLET | Freq: Four times a day (QID) | ORAL | 0 refills | Status: DC

## 2018-09-24 MED ORDER — HYDROMORPHONE HCL 1 MG/ML IJ SOLN
1.0000 mg | Freq: Once | INTRAMUSCULAR | Status: AC
  Administered 2018-09-24: 1 mg via INTRAMUSCULAR
  Filled 2018-09-24: qty 1

## 2018-09-24 MED ORDER — ONDANSETRON 4 MG PO TBDP
4.0000 mg | ORAL_TABLET | Freq: Once | ORAL | Status: AC
  Administered 2018-09-24: 4 mg via ORAL
  Filled 2018-09-24: qty 1

## 2018-09-24 MED ORDER — METHOCARBAMOL 500 MG PO TABS
750.0000 mg | ORAL_TABLET | Freq: Once | ORAL | Status: AC
  Administered 2018-09-24: 750 mg via ORAL
  Filled 2018-09-24: qty 2

## 2018-09-24 NOTE — Discharge Instructions (Addendum)
Apply a heating pad to your area of pain for 20 minutes 3 times daily.  Add the muscle relaxer prescribed.  Also prescribed you a Lidoderm pain patch which may help additionally with pain relief. Avoid lifting,  Bending,  Twisting or any other activity that worsens your pain over the next week.  A  You should get rechecked if your symptoms are not better over the next 7 days,  Or you develop increased pain,  Weakness in your leg(s) or loss of bladder or bowel function - these are symptoms of a worse injury.

## 2018-09-24 NOTE — ED Triage Notes (Addendum)
Pt reports that he has lower back pain that radiates to left side. Reports picking up mother in law approx a week ago and she weighs over 200 lbs. Pt has been taking oxycodone for pain

## 2018-09-24 NOTE — ED Provider Notes (Signed)
[facility] EMERGENCY DEPARTMENT Provider Note   CSN: [id number] Arrival date & time: 09/24/18  1706     History   Chief Complaint Chief Complaint  Patient presents with  . Back Pain    HPI [name] is a 42 y.o. male with a history of his outlined below, most significant for chronic low back pain for which he is on oxycodone 10 mg 3 times daily presenting with worse pain over the past week since having to lift his 200 pound mother-in-law.  Endorses pain in his left lower back with radiation into his posterior buttock and left hip.  He denies weakness or numbness in his legs and also has had no urinary or fecal incontinence or retention.  Denies fevers or chills.  No nausea or vomiting.  He has had decreased sleep this past week over difficulty finding a comfortable position for sleeping.  He has had no other treatments for his pain other than the oxycodone which he states is not working.  The history is provided by the patient.  Back Pain   Pertinent negatives include no chest pain, no fever, no numbness, no abdominal pain, no dysuria and no weakness.    Past Medical History:  Diagnosis Date  . Back pain   . Bradycardia 08/17/2011  . COPD (chronic obstructive pulmonary disease) ([place])   . ETOH abuse   . GERD (gastroesophageal reflux disease)   . Macrocytosis 08/17/2011  . Pancreatitis   . Pneumonia   . SAH (subarachnoid hemorrhage) ([place])   . Seizures ([place])    last one 6 mos ago    Patient Active Problem List   Diagnosis Date Noted  . Abdominal pain, epigastric 12/31/2017  . Gastric ulcer 08/12/2017  . ETOH abuse   . Atypical chest pain   . Hypercalcemia 10/08/2016  . Hypokalemia 10/08/2016  . Diarrhea 10/09/2015  . Pancreatitis, alcoholic, acute   . Alcohol-induced chronic pancreatitis ([place])   . Acute pancreatitis   . Pancreatitis 12/26/2014  . Pancreatic pseudocyst 12/26/2014  . Hyponatremia 12/26/2014  . HTN (hypertension) 07/27/2013  . Abdominal pain  07/11/2012  . Macrocytosis 08/17/2011  . Marijuana abuse 08/17/2011  . Bradycardia 08/17/2011  . Alcohol withdrawal ([place]) 08/17/2011  . Elevated blood pressure 07/08/2011  . Left against medical advice 07/08/2011  . Acute alcoholic pancreatitis 76/16/0737  . Alcohol abuse, continuous 07/07/2011  . Tobacco abuse 07/07/2011  . Seizure disorder ([place]) 07/07/2011  . Adynamic ileus ([place]) 07/07/2011  . Wheezing 07/07/2011  . COPD (chronic obstructive pulmonary disease) ([place]) 07/07/2011    Past Surgical History:  Procedure Laterality Date  . BACK SURGERY    . ESOPHAGOGASTRODUODENOSCOPY (EGD) WITH PROPOFOL N/A 08/14/2017   Procedure: ESOPHAGOGASTRODUODENOSCOPY (EGD) WITH PROPOFOL;  Surgeon: Daneil Dolin, MD;  Location: [facility] ENDO SUITE;  Service: Endoscopy;  Laterality: N/A;  1:30pm  . ESOPHAGOGASTRODUODENOSCOPY (EGD) WITH PROPOFOL N/A 03/13/2018   Procedure: ESOPHAGOGASTRODUODENOSCOPY (EGD) WITH PROPOFOL;  Surgeon: Daneil Dolin, MD;  Location: [facility] ENDO SUITE;  Service: Endoscopy;  Laterality: N/A;  2:30pm  . SEPTOPLASTY          Home Medications    Prior to Admission medications   Medication Sig Start Date End Date Taking? Authorizing Provider  albuterol (PROVENTIL HFA;VENTOLIN HFA) 108 (90 BASE) MCG/ACT inhaler Inhale 2 puffs into the lungs every 6 (six) hours as needed for wheezing or shortness of breath.    [provider]  albuterol (PROVENTIL) (2.5 MG/3ML) 0.083% nebulizer solution Take 3 mLs (2.5 mg total)  by nebulization every 4 (four) hours as needed for wheezing or shortness of breath. 09/09/13   Orpah Greek, MD  carbamazepine (TEGRETOL) 200 MG tablet Take 400 mg by mouth 2 (two) times daily.    [provider]  levETIRAcetam (KEPPRA) 500 MG tablet Take 500 mg by mouth 2 (two) times daily.     [provider]  lidocaine (LIDODERM) 5 % Place 1 patch onto the skin daily. Remove & Discard patch within 12 hours or as directed by MD 09/24/18    Evalee Jefferson, PA-C  lisinopril (PRINIVIL,ZESTRIL) 20 MG tablet Take 20 mg by mouth daily.    [provider]  methocarbamol (ROBAXIN-750) 750 MG tablet Take 1 tablet (750 mg total) by mouth 4 (four) times daily. 09/24/18   Evalee Jefferson, PA-C  Oxycodone HCl 10 MG TABS Take 10 mg by mouth 2 (two) times daily. My take an additional 5 mg mid day as needed for pain    [provider]  pantoprazole (PROTONIX) 40 MG tablet Take 1 tablet (40 mg total) by mouth 2 (two) times daily before a meal. 08/12/17   Mahala Menghini, PA-C  tiotropium (SPIRIVA) 18 MCG inhalation capsule Place 18 mcg into inhaler and inhale daily.    [provider]    Family History Family History  Problem Relation Age of Onset  . Coronary artery disease Mother        deceased age 45  . Seizures Father   . Alcohol abuse Father   . Colon cancer Neg Hx     Social History Social History   Tobacco Use  . Smoking status: Current Every Day Smoker    Packs/day: 1.00    Years: 20.00    Pack years: 20.00    Types: Cigarettes  . Smokeless tobacco: Never Used  Substance Use Topics  . Alcohol use: Not Currently    Comment: few beers  . Drug use: Not Currently    Types: Marijuana    Comment: months      Allergies   Patient has no known allergies.   Review of Systems Review of Systems  Constitutional: Negative for chills and fever.  Respiratory: Negative for shortness of breath.   Cardiovascular: Negative for chest pain and leg swelling.  Gastrointestinal: Negative for abdominal distention, abdominal pain and constipation.  Genitourinary: Negative for difficulty urinating, dysuria, flank pain, frequency and urgency.  Musculoskeletal: Positive for back pain. Negative for gait problem and joint swelling.  Skin: Negative for rash.  Neurological: Negative for weakness and numbness.     Physical Exam Updated Vital Signs BP (!) 142/86 (BP Location: Right Arm)   Pulse 94   Temp 97.9 F (36.6  C) (Oral)   Resp 18   Ht 5\' 9"  (1.753 m)   Wt 56.2 kg   SpO2 96%   BMI 18.31 kg/m   Physical Exam  Constitutional: He appears well-developed and well-nourished.  HENT:  Head: Normocephalic.  Eyes: Conjunctivae are normal.  Neck: Normal range of motion. Neck supple.  Cardiovascular: Normal rate and intact distal pulses.  Pedal pulses normal.  Pulmonary/Chest: Effort normal.  Abdominal: Soft. Bowel sounds are normal. He exhibits no distension and no mass.  Musculoskeletal: Normal range of motion. He exhibits no edema.       Lumbar back: He exhibits tenderness. He exhibits no swelling, no edema and no spasm.  Tender to palpation left lower lumbar soft tissue with radiation across the left posterior pelvic rim.  He  walks  with forward flexion patient walks with torso in forward flexion.  Gait otherwise normal, no foot drop.  Neurological: He is alert. He has normal strength. He displays no atrophy and no tremor. No sensory deficit. Gait normal.  Reflex Scores:      Patellar reflexes are 2+ on the right side and 2+ on the left side.      Achilles reflexes are 2+ on the right side and 2+ on the left side. No strength deficit noted in hip and knee flexor and extensor muscle groups.  Ankle flexion and extension intact.  Patient noted to have a tremor in his upper extremities.  He states this is a reaction to severe pain.    Skin: Skin is warm and dry.  Psychiatric: He has a normal mood and affect.  Nursing note and vitals reviewed.    ED Treatments / Results  Labs (all labs ordered are listed, but only abnormal results are displayed) Labs Reviewed - No data to display  EKG None  Radiology Dg Lumbar Spine Complete  Result Date: 09/24/2018 CLINICAL DATA:  Low back pain for 1 week EXAM: LUMBAR SPINE - COMPLETE 4+ VIEW COMPARISON:  CT abdomen and pelvis 08/12/2017 FINDINGS: Five non-rib-bearing lumbar vertebra. Partial sacralization of S1. Bones appear demineralized. Disc space  narrowing L5-S1, minimal at L4-L5. Vertebral body and disc space heights otherwise maintained. No fracture, subluxation, or bone destruction. No spondylolysis. SI joints preserved. Numerous pancreatic calcifications consistent with chronic calcific pancreatitis. Scattered atherosclerotic calcifications aorta and iliac arteries. IMPRESSION: No acute lumbar spine abnormalities. Osseous demineralization with mild degenerative disc disease changes at lower lumbar spine. Electronically Signed   By: Lavonia Dana M.D.   On: 09/24/2018 18:47    Procedures Procedures (including critical care time)  Medications Ordered in ED Medications  HYDROmorphone (DILAUDID) injection 1 mg (1 mg Intramuscular Given 09/24/18 1756)  ondansetron (ZOFRAN-ODT) disintegrating tablet 4 mg (4 mg Oral Given 09/24/18 1756)  methocarbamol (ROBAXIN) tablet 750 mg (750 mg Oral Given 09/24/18 1910)     Initial Impression / Assessment and Plan / ED Course  I have reviewed the triage vital signs and the nursing notes.  Pertinent labs & imaging results that were available during my care of the patient were reviewed by me and considered in my medical decision making (see chart for details).     Imaging reviewed and discussed with patient.  He is aware of the degenerative changes.  He was not aware of the demineralization which was discussed.  He should follow-up with his primary doctor for further evaluation of this finding.  He endorses moderate improvement in his pain with the Dilaudid injection which was given.  I have added Robaxin and Lidoderm patch in addition to his home oxycodone.  Discussed strict return precautions, otherwise follow-up with PCP in 1 week if symptoms are not improving.  Final Clinical Impressions(s) / ED Diagnoses   Final diagnoses:  Acute left-sided low back pain with left-sided sciatica    ED Discharge Orders         Ordered    methocarbamol (ROBAXIN-750) 750 MG tablet  4 times daily     09/24/18  1858    lidocaine (LIDODERM) 5 %  Every 24 hours,   Status:  Discontinued     09/24/18 1900    lidocaine (LIDODERM) 5 %  Every 24 hours     09/24/18 1902           Evalee Jefferson, PA-C 09/24/18 1926    [place],  Barth Kirks, MD 09/24/18 2228

## 2019-09-04 ENCOUNTER — Encounter (HOSPITAL_COMMUNITY): Payer: Self-pay

## 2019-09-04 ENCOUNTER — Emergency Department (HOSPITAL_COMMUNITY)
Admission: EM | Admit: 2019-09-04 | Discharge: 2019-09-04 | Disposition: A | Discharge status: Home / Self Care | Payer: Medicaid Other | Attending: Emergency Medicine | Admitting: Emergency Medicine

## 2019-09-04 ENCOUNTER — Other Ambulatory Visit: Payer: Self-pay

## 2019-09-04 ENCOUNTER — Emergency Department (HOSPITAL_COMMUNITY): Payer: Medicaid Other

## 2019-09-04 DIAGNOSIS — F17210 Nicotine dependence, cigarettes, uncomplicated: Secondary | ICD-10-CM | POA: Diagnosis not present

## 2019-09-04 DIAGNOSIS — J449 Chronic obstructive pulmonary disease, unspecified: Secondary | ICD-10-CM | POA: Diagnosis not present

## 2019-09-04 DIAGNOSIS — S02602A Fracture of unspecified part of body of left mandible, initial encounter for closed fracture: Secondary | ICD-10-CM | POA: Diagnosis not present

## 2019-09-04 DIAGNOSIS — Z20828 Contact with and (suspected) exposure to other viral communicable diseases: Secondary | ICD-10-CM | POA: Diagnosis not present

## 2019-09-04 DIAGNOSIS — R569 Unspecified convulsions: Secondary | ICD-10-CM

## 2019-09-04 DIAGNOSIS — W19XXXA Unspecified fall, initial encounter: Secondary | ICD-10-CM | POA: Diagnosis not present

## 2019-09-04 DIAGNOSIS — Y92410 Unspecified street and highway as the place of occurrence of the external cause: Secondary | ICD-10-CM | POA: Insufficient documentation

## 2019-09-04 DIAGNOSIS — S02609A Fracture of mandible, unspecified, initial encounter for closed fracture: Secondary | ICD-10-CM

## 2019-09-04 DIAGNOSIS — Y999 Unspecified external cause status: Secondary | ICD-10-CM | POA: Insufficient documentation

## 2019-09-04 DIAGNOSIS — Y939 Activity, unspecified: Secondary | ICD-10-CM | POA: Diagnosis not present

## 2019-09-04 DIAGNOSIS — Z79899 Other long term (current) drug therapy: Secondary | ICD-10-CM | POA: Insufficient documentation

## 2019-09-04 LAB — COMPREHENSIVE METABOLIC PANEL
ALT: 21 U/L (ref 0–44)
AST: 33 U/L (ref 15–41)
Albumin: 4 g/dL (ref 3.5–5.0)
Alkaline Phosphatase: 93 U/L (ref 38–126)
Anion gap: 13 (ref 5–15)
BUN: 8 mg/dL (ref 6–20)
CO2: 24 mmol/L (ref 22–32)
Calcium: 9.3 mg/dL (ref 8.9–10.3)
Chloride: 96 mmol/L — ABNORMAL LOW (ref 98–111)
Creatinine, Ser: 0.42 mg/dL — ABNORMAL LOW (ref 0.61–1.24)
GFR calc Af Amer: >60 mL/min (ref >60)
GFR calc non Af Amer: >60 mL/min (ref >60)
Glucose, Bld: 187 mg/dL — ABNORMAL HIGH (ref 70–99)
Potassium: 3.9 mmol/L (ref 3.5–5.1)
Sodium: 133 mmol/L — ABNORMAL LOW (ref 135–145)
Total Bilirubin: 0.7 mg/dL (ref 0.3–1.2)
Total Protein: 7.9 g/dL (ref 6.5–8.1)

## 2019-09-04 LAB — RAPID URINE DRUG SCREEN, HOSP PERFORMED
Amphetamines: NOT DETECTED
Barbiturates: NOT DETECTED
Benzodiazepines: POSITIVE — AB
Cocaine: NOT DETECTED
Opiates: NOT DETECTED
Tetrahydrocannabinol: POSITIVE — AB

## 2019-09-04 LAB — CBC WITH DIFFERENTIAL/PLATELET
Abs Immature Granulocytes: 0.05 10*3/uL (ref 0.00–0.07)
Basophils Absolute: 0.1 10*3/uL (ref 0.0–0.1)
Basophils Relative: 1 %
Eosinophils Absolute: 0.2 10*3/uL (ref 0.0–0.5)
Eosinophils Relative: 2 %
HCT: 43 % (ref 39.0–52.0)
Hemoglobin: 14.5 g/dL (ref 13.0–17.0)
Immature Granulocytes: 0 %
Lymphocytes Relative: 30 %
Lymphs Abs: 3.5 10*3/uL (ref 0.7–4.0)
MCH: 34.4 pg — ABNORMAL HIGH (ref 26.0–34.0)
MCHC: 33.7 g/dL (ref 30.0–36.0)
MCV: 102.1 fL — ABNORMAL HIGH (ref 80.0–100.0)
Monocytes Absolute: 1.2 10*3/uL — ABNORMAL HIGH (ref 0.1–1.0)
Monocytes Relative: 11 %
Neutro Abs: 6.4 10*3/uL (ref 1.7–7.7)
Neutrophils Relative %: 56 %
Platelets: 237 10*3/uL (ref 150–400)
RBC: 4.21 MIL/uL — ABNORMAL LOW (ref 4.22–5.81)
RDW: 12.6 % (ref 11.5–15.5)
WBC: 11.4 10*3/uL — ABNORMAL HIGH (ref 4.0–10.5)
nRBC: 0 % (ref 0.0–0.2)

## 2019-09-04 LAB — ETHANOL: Alcohol, Ethyl (B): <10 mg/dL (ref <10)

## 2019-09-04 LAB — SARS CORONAVIRUS 2 (TAT 6-24 HRS): SARS Coronavirus 2: NEGATIVE

## 2019-09-04 LAB — CARBAMAZEPINE LEVEL, TOTAL: Carbamazepine Lvl: 7.4 ug/mL (ref 4.0–12.0)

## 2019-09-04 MED ORDER — MORPHINE SULFATE (PF) 4 MG/ML IV SOLN
4.0000 mg | Freq: Once | INTRAVENOUS | Status: AC
  Administered 2019-09-04: 13:00:00 4 mg via INTRAVENOUS
  Filled 2019-09-04: qty 1

## 2019-09-04 MED ORDER — IBUPROFEN 400 MG PO TABS: 400.0000 mg | ORAL_TABLET | Freq: Four times a day (QID) | ORAL | 0 refills | Status: DC | PRN

## 2019-09-04 MED ORDER — HYDROCODONE-ACETAMINOPHEN 7.5-325 MG/15ML PO SOLN: 10.0000 mL | Freq: Four times a day (QID) | ORAL | 0 refills | Status: DC | PRN

## 2019-09-04 MED ORDER — LEVETIRACETAM IN NACL 500 MG/100ML IV SOLN
500.0000 mg | Freq: Once | INTRAVENOUS | Status: AC
  Administered 2019-09-04: 12:00:00 500 mg via INTRAVENOUS
  Filled 2019-09-04: qty 100

## 2019-09-04 MED ORDER — LORAZEPAM 2 MG/ML IJ SOLN
1.0000 mg | Freq: Once | INTRAMUSCULAR | Status: AC
  Administered 2019-09-04: 09:00:00 1 mg via INTRAVENOUS
  Filled 2019-09-04: qty 1

## 2019-09-04 MED ORDER — KETOROLAC TROMETHAMINE 30 MG/ML IJ SOLN
15.0000 mg | Freq: Once | INTRAMUSCULAR | Status: AC
  Administered 2019-09-04: 15 mg via INTRAVENOUS
  Filled 2019-09-04: qty 1

## 2019-09-04 NOTE — ED Notes (Signed)
A contact Person(cousin) Rogene Houston

## 2019-09-04 NOTE — ED Notes (Signed)
Woody Vanderhoff(cousin) contact person [phone].

## 2019-09-04 NOTE — ED Provider Notes (Signed)
[facility] EMERGENCY DEPARTMENT Provider Note   CSN: [id number] Arrival date & time: 09/04/19  0848     History   Chief Complaint Chief Complaint  Patient presents with   Seizures    HPI [name] is a 43 y.o. male.     HPI   43 year old male with possible seizure.  Brought in by EMS.  Found on the ground in the street.  Patient had urinated himself.  Last thing he remembers is walking to the store.  He thinks he may potentially had a seizure.  He has a history of the same.  He is on Tegretol and Keppra.  He reports compliance aside from not taking them this morning.  Is complaining some pain in his chin and in front of L ear.Marland Kitchen  He is not sure if he struck this area or not.  He does have a past history of alcohol abuse.  Reports her last drink was sometime yesterday.  Past Medical History:  Diagnosis Date   Back pain    Bradycardia 08/17/2011   COPD (chronic obstructive pulmonary disease) (HCC)    ETOH abuse    GERD (gastroesophageal reflux disease)    Macrocytosis 08/17/2011   Pancreatitis    Pneumonia    SAH (subarachnoid hemorrhage) (HCC)    Seizures ([place])    last one 6 mos ago    Patient Active Problem List   Diagnosis Date Noted   Abdominal pain, epigastric 12/31/2017   Gastric ulcer 08/12/2017   ETOH abuse    Atypical chest pain    Hypercalcemia 10/08/2016   Hypokalemia 10/08/2016   Diarrhea 10/09/2015   Pancreatitis, alcoholic, acute    Alcohol-induced chronic pancreatitis ([place])    Acute pancreatitis    Pancreatitis 12/26/2014   Pancreatic pseudocyst 12/26/2014   Hyponatremia 12/26/2014   HTN (hypertension) 07/27/2013   Abdominal pain 07/11/2012   Macrocytosis 08/17/2011   Marijuana abuse 08/17/2011   Bradycardia 08/17/2011   Alcohol withdrawal ([place]) 08/17/2011   Elevated blood pressure 07/08/2011   Left against medical advice [id number]   Acute alcoholic pancreatitis 0000000   Alcohol abuse, continuous  07/07/2011   Tobacco abuse 07/07/2011   Seizure disorder ([place]) 07/07/2011   Adynamic ileus ([place]) 07/07/2011   Wheezing 07/07/2011   COPD (chronic obstructive pulmonary disease) ([place]) 07/07/2011    Past Surgical History:  Procedure Laterality Date   BACK SURGERY     ESOPHAGOGASTRODUODENOSCOPY (EGD) WITH PROPOFOL N/A 08/14/2017   Procedure: ESOPHAGOGASTRODUODENOSCOPY (EGD) WITH PROPOFOL;  Surgeon: Daneil Dolin, MD;  Location: [facility] ENDO [facility];  Service: Endoscopy;  Laterality: N/A;  1:30pm   ESOPHAGOGASTRODUODENOSCOPY (EGD) WITH PROPOFOL N/A 03/13/2018   Procedure: ESOPHAGOGASTRODUODENOSCOPY (EGD) WITH PROPOFOL;  Surgeon: Daneil Dolin, MD;  Location: [facility] ENDO SUITE;  Service: Endoscopy;  Laterality: N/A;  2:30pm   SEPTOPLASTY          Home Medications    Prior to Admission medications   Medication Sig Start Date End Date Taking? Authorizing Provider  albuterol (PROVENTIL HFA;VENTOLIN HFA) 108 (90 BASE) MCG/ACT inhaler Inhale 2 puffs into the lungs every 6 (six) hours as needed for wheezing or shortness of breath.    [provider]  albuterol (PROVENTIL) (2.5 MG/3ML) 0.083% nebulizer solution Take 3 mLs (2.5 mg total) by nebulization every 4 (four) hours as needed for wheezing or shortness of breath. 09/09/13   Orpah Greek, MD  carbamazepine (TEGRETOL) 200 MG tablet Take 400 mg by mouth 2 (two) times daily.    [provider]  levETIRAcetam (KEPPRA) 500 MG tablet Take 500 mg by mouth 2 (two) times daily.     [provider]  lidocaine (LIDODERM) 5 % Place 1 patch onto the skin daily. Remove & Discard patch within 12 hours or as directed by MD 09/24/18   Evalee Jefferson, PA-C  lisinopril (PRINIVIL,ZESTRIL) 20 MG tablet Take 20 mg by mouth daily.    [provider]  methocarbamol (ROBAXIN-750) 750 MG tablet Take 1 tablet (750 mg total) by mouth 4 (four) times daily. 09/24/18   Evalee Jefferson, PA-C  Oxycodone HCl 10 MG TABS Take 10 mg by  mouth 2 (two) times daily. My take an additional 5 mg mid day as needed for pain    [provider]  pantoprazole (PROTONIX) 40 MG tablet Take 1 tablet (40 mg total) by mouth 2 (two) times daily before a meal. 08/12/17   Mahala Menghini, PA-C  tiotropium (SPIRIVA) 18 MCG inhalation capsule Place 18 mcg into inhaler and inhale daily.    [provider]    Family History Family History  Problem Relation Age of Onset   Coronary artery disease Mother        deceased age 46   Seizures Father    Alcohol abuse Father    Colon cancer Neg Hx     Social History Social History   Tobacco Use   Smoking status: Current Every Day Smoker    Packs/day: 1.00    Years: 20.00    Pack years: 20.00    Types: Cigarettes   Smokeless tobacco: Never Used  Substance Use Topics   Alcohol use: Yes    Comment: couple of beers daily   Drug use: Not Currently    Types: Marijuana    Comment: months      Allergies   Patient has no known allergies.   Review of Systems Review of Systems  All systems reviewed and negative, other than as noted in HPI.  Physical Exam Updated Vital Signs BP (!) 153/106 (BP Location: Right Arm)    Pulse 99    Temp 98.3 F (36.8 C)    Resp 20    Ht 5\' 9"  (1.753 m)    Wt 54.4 kg    SpO2 90%    BMI 17.72 kg/m   Physical Exam Vitals signs and nursing note reviewed.  Constitutional:      Appearance: He is well-developed.     Comments: Disheveled appearance.  Incontinent of urine.  HENT:     Head: Normocephalic.     Comments: Edentulous.  Swelling, suspect hematoma, at the chin/body of the right mandible.  Superficial overlying abrasion.  Tender L cheek and angle L mandible.  Eyes:     General:        Right eye: No discharge.        Left eye: No discharge.     Conjunctiva/sclera: Conjunctivae normal.  Neck:     Musculoskeletal: Neck supple.  Cardiovascular:     Rate and Rhythm: Normal rate and regular rhythm.     Heart sounds: Normal  heart sounds. No murmur. No friction rub. No gallop.   Pulmonary:     Effort: Pulmonary effort is normal. No respiratory distress.     Breath sounds: Normal breath sounds.  Abdominal:     General: There is no distension.     Palpations: Abdomen is soft.     Tenderness: There is no abdominal tenderness.  Musculoskeletal:  General: No tenderness.     Comments: No midline spinal tenderness  Skin:    General: Skin is warm and dry.  Neurological:     Mental Status: He is alert.     Comments: Oriented to self, place and year.  No focal deficits.  Psychiatric:        Behavior: Behavior normal.        Thought Content: Thought content normal.      ED Treatments / Results  Labs (all labs ordered are listed, but only abnormal results are displayed) Labs Reviewed  CBC WITH DIFFERENTIAL/PLATELET - Abnormal; Notable for the following components:      Result Value   WBC 11.4 (*)    RBC 4.21 (*)    MCV 102.1 (*)    MCH 34.4 (*)    Monocytes Absolute 1.2 (*)    All other components within normal limits  COMPREHENSIVE METABOLIC PANEL - Abnormal; Notable for the following components:   Sodium 133 (*)    Chloride 96 (*)    Glucose, Bld 187 (*)    Creatinine, Ser 0.42 (*)    All other components within normal limits  RAPID URINE DRUG SCREEN, HOSP PERFORMED - Abnormal; Notable for the following components:   Benzodiazepines POSITIVE (*)    Tetrahydrocannabinol POSITIVE (*)    All other components within normal limits  SARS CORONAVIRUS 2 (TAT 6-24 HRS)  CARBAMAZEPINE LEVEL, TOTAL  ETHANOL  LEVETIRACETAM LEVEL    EKG None  Radiology Ct Head Wo Contrast  Result Date: 09/04/2019 CLINICAL DATA:  Mandibular pain and small amount of blood on the nose following a seizure. Urinary incontinence associated with the seizure. Found in the street. EXAM: CT HEAD WITHOUT CONTRAST CT MAXILLOFACIAL WITHOUT CONTRAST TECHNIQUE: Multidetector CT imaging of the head and maxillofacial structures  were performed using the standard protocol without intravenous contrast. Multiplanar CT image reconstructions of the maxillofacial structures were also generated. COMPARISON:  Head CT dated 12/31/2009 and maxillofacial CT dated 04/11/2007. FINDINGS: CT HEAD FINDINGS Brain: Mild to moderate enlargement of the ventricles and subarachnoid spaces with an interval increase in size. Interval mild-to-moderate patchy white matter low density in both cerebral hemispheres. No intracranial hemorrhage, mass lesion or CT evidence of acute infarction. Vascular: No hyperdense vessel or unexpected calcification. Skull: Normal. Negative for fracture or focal lesion. Other: None. CT MAXILLOFACIAL FINDINGS Osseous: There is an acute, comminuted fracture through the left mandibular neck with 1 shaft width of medial displacement of the proximal fragment as well as significant distal displacement with complete overlapping of the fragments. There is also anterior and medial dislocation of the left mandibular head relative to the temporomandibular fossa. The previously demonstrated bilateral nasal bone fractures have healed. No additional acute fractures are seen. The patient is edentulous. Orbits: Negative. No traumatic or inflammatory finding. Sinuses: Moderate bilateral maxillary sinus mucosal thickening. Mild bilateral inferior frontal sinus mucosal thickening. Soft tissues: Unremarkable. Other: Extensive, marked bilateral carotid artery atheromatous calcifications. IMPRESSION: 1. Acute, comminuted left mandibular neck fracture/dislocation with significant displacement, as described above. 2. No skull fracture or intracranial hemorrhage. 3. Interval mild-to-moderate diffuse cerebral and cerebellar atrophy. 4. Interval mild-to-moderate chronic small vessel white matter ischemic changes in both cerebral hemispheres. 5. Chronic bilateral maxillary and bilateral frontal sinusitis. 6. Marked bilateral carotid artery atheromatous  calcifications. Electronically Signed   By: Claudie Revering M.D.   On: 09/04/2019 12:38   Ct Maxillofacial Wo Contrast  Result Date: 09/04/2019 CLINICAL DATA:  Mandibular pain and small amount of blood  on the nose following a seizure. Urinary incontinence associated with the seizure. Found in the street. EXAM: CT HEAD WITHOUT CONTRAST CT MAXILLOFACIAL WITHOUT CONTRAST TECHNIQUE: Multidetector CT imaging of the head and maxillofacial structures were performed using the standard protocol without intravenous contrast. Multiplanar CT image reconstructions of the maxillofacial structures were also generated. COMPARISON:  Head CT dated 12/31/2009 and maxillofacial CT dated 04/11/2007. FINDINGS: CT HEAD FINDINGS Brain: Mild to moderate enlargement of the ventricles and subarachnoid spaces with an interval increase in size. Interval mild-to-moderate patchy white matter low density in both cerebral hemispheres. No intracranial hemorrhage, mass lesion or CT evidence of acute infarction. Vascular: No hyperdense vessel or unexpected calcification. Skull: Normal. Negative for fracture or focal lesion. Other: None. CT MAXILLOFACIAL FINDINGS Osseous: There is an acute, comminuted fracture through the left mandibular neck with 1 shaft width of medial displacement of the proximal fragment as well as significant distal displacement with complete overlapping of the fragments. There is also anterior and medial dislocation of the left mandibular head relative to the temporomandibular fossa. The previously demonstrated bilateral nasal bone fractures have healed. No additional acute fractures are seen. The patient is edentulous. Orbits: Negative. No traumatic or inflammatory finding. Sinuses: Moderate bilateral maxillary sinus mucosal thickening. Mild bilateral inferior frontal sinus mucosal thickening. Soft tissues: Unremarkable. Other: Extensive, marked bilateral carotid artery atheromatous calcifications. IMPRESSION: 1. Acute,  comminuted left mandibular neck fracture/dislocation with significant displacement, as described above. 2. No skull fracture or intracranial hemorrhage. 3. Interval mild-to-moderate diffuse cerebral and cerebellar atrophy. 4. Interval mild-to-moderate chronic small vessel white matter ischemic changes in both cerebral hemispheres. 5. Chronic bilateral maxillary and bilateral frontal sinusitis. 6. Marked bilateral carotid artery atheromatous calcifications. Electronically Signed   By: Claudie Revering M.D.   On: 09/04/2019 12:38    Procedures Procedures (including critical care time)  Medications Ordered in ED Medications  LORazepam (ATIVAN) injection 1 mg (has no administration in time range)  levETIRAcetam (KEPPRA) IVPB 500 mg/100 mL premix (has no administration in time range)     Initial Impression / Assessment and Plan / ED Course  I have reviewed the triage vital signs and the nursing notes.  Pertinent labs & imaging results that were available during my care of the patient were reviewed by me and considered in my medical decision making (see chart for details).        43yM with likely seizure. Back to baseline. Given keppra in ED since likely missed am dose. Labs ok. Sustained mandible fx. Discussed with ENT. Since edentulous this will be non-op. PRN pain meds. Soft foods/liquids. No chewing. FU with Dr Redmond Baseman next week.   Final Clinical Impressions(s) / ED Diagnoses   Final diagnoses:  Seizure-like activity ([place])  Closed fracture of left side of mandible, unspecified mandibular site, initial encounter [facility])    ED Discharge Orders    None       [name], MD 09/05/19 1237

## 2019-09-04 NOTE — Discharge Instructions (Signed)
You broke your mandible (jaw).  The actual break is just in front of your left ear.  Since you have no teeth, this is managed nonoperatively.  You need to have a soft diet.  Even though you do have teeth you should not chew.  Take pain medicine as needed.  I discussed your case with Dr. Redmond Baseman, ENT.  He would like to see you in his office late next week.  You need to call the provided number to schedule this appointment.  You need to specifically ask for him.

## 2019-09-04 NOTE — ED Triage Notes (Addendum)
Pt brought in by EMS . Pt found in street.. reported pt had seizure. Pt had urrinated on himself. Reports his right jaw is hurting and has small amt of blood on nose. Takes tegretol and keppra oriented to person and year

## 2019-09-08 LAB — LEVETIRACETAM LEVEL: Levetiracetam Lvl: <1 ug/mL — ABNORMAL LOW (ref 10.0–40.0)

## 2020-04-20 ENCOUNTER — Other Ambulatory Visit: Payer: Self-pay | Admitting: Adult Health

## 2020-04-20 ENCOUNTER — Other Ambulatory Visit (HOSPITAL_COMMUNITY): Payer: Self-pay | Admitting: Adult Health

## 2020-04-20 DIAGNOSIS — R590 Localized enlarged lymph nodes: Secondary | ICD-10-CM

## 2020-04-28 ENCOUNTER — Other Ambulatory Visit: Payer: Self-pay

## 2020-04-28 ENCOUNTER — Ambulatory Visit (HOSPITAL_COMMUNITY)
Admission: RE | Admit: 2020-04-28 | Discharge: 2020-04-28 | Disposition: A | Discharge status: Home / Self Care | Payer: Medicaid Other | Source: Ambulatory Visit | Attending: Adult Health | Admitting: Adult Health

## 2020-04-28 DIAGNOSIS — R590 Localized enlarged lymph nodes: Secondary | ICD-10-CM | POA: Insufficient documentation

## 2020-05-20 ENCOUNTER — Other Ambulatory Visit (HOSPITAL_COMMUNITY): Payer: Self-pay | Admitting: Family Medicine

## 2020-05-20 ENCOUNTER — Other Ambulatory Visit: Payer: Self-pay | Admitting: Family Medicine

## 2020-05-20 DIAGNOSIS — R221 Localized swelling, mass and lump, neck: Secondary | ICD-10-CM

## 2020-05-20 DIAGNOSIS — R590 Localized enlarged lymph nodes: Secondary | ICD-10-CM

## 2020-06-03 ENCOUNTER — Encounter (HOSPITAL_COMMUNITY): Payer: Self-pay

## 2020-06-03 ENCOUNTER — Ambulatory Visit (HOSPITAL_COMMUNITY): Payer: Medicaid Other

## 2020-07-22 ENCOUNTER — Encounter (HOSPITAL_COMMUNITY): Payer: Self-pay

## 2020-07-22 NOTE — Progress Notes (Signed)
I placed an introductory phone call to the patient today. I introduced myself and explained my role in the patient's care. I briefly explained what the patient can expect during his initial visit with  Dr. Delton Coombes. Patient verbalized understanding but was not willing to engage in conversation at this time. I provided my contact information and encouraged the patient to call with questions or concerns.

## 2020-07-26 ENCOUNTER — Other Ambulatory Visit: Payer: Self-pay

## 2020-07-26 ENCOUNTER — Inpatient Hospital Stay (HOSPITAL_COMMUNITY): Payer: Medicaid Other | Attending: Hematology | Admitting: Hematology

## 2020-07-26 VITALS — BP 148/82 | HR 82 | Temp 97.5°F | Resp 20 | Ht 67.0 in | Wt 108.5 lb

## 2020-07-26 DIAGNOSIS — R1310 Dysphagia, unspecified: Secondary | ICD-10-CM | POA: Diagnosis not present

## 2020-07-26 DIAGNOSIS — R590 Localized enlarged lymph nodes: Secondary | ICD-10-CM | POA: Diagnosis not present

## 2020-07-26 DIAGNOSIS — F17210 Nicotine dependence, cigarettes, uncomplicated: Secondary | ICD-10-CM

## 2020-07-26 DIAGNOSIS — R634 Abnormal weight loss: Secondary | ICD-10-CM

## 2020-07-26 DIAGNOSIS — M7989 Other specified soft tissue disorders: Secondary | ICD-10-CM

## 2020-07-26 NOTE — Patient Instructions (Signed)
[place] at [facility] Discharge Instructions  You were seen today by Dr. Delton Coombes. He went over your recent results. You will be scheduled for a PET scan. You will be referred to an ENT doctor for biopsy of your cervical masses. You will be referred to a dietician to help you eat food and maintain your weight. Dr. Delton Coombes will see you back after the PET scan for labs and follow up.   Thank you for choosing [place] at [facility] to provide your oncology and hematology care.  To afford each patient quality time with our provider, please arrive at least 15 minutes before your scheduled appointment time.   If you have a lab appointment with the [facility] please come in thru the Main Entrance and check in at the main information desk  You need to re-schedule your appointment should you arrive 10 or more minutes late.  We strive to give you quality time with our providers, and arriving late affects you and other patients whose appointments are after yours.  Also, if you no show three or more times for appointments you may be dismissed from the clinic at the providers discretion.     Again, thank you for choosing [facility].  Our hope is that these requests will decrease the amount of time that you wait before being seen by our physicians.       _____________________________________________________________  Should you have questions after your visit to [facility], please contact our office at [phone] between the hours of 8:00 a.m. and 4:30 p.m.  Voicemails left after 4:00 p.m. will not be returned until the following business day.  For prescription refill requests, have your pharmacy contact our office and allow 72 hours.    Cancer Center Support Programs:   > Cancer Support Group  2nd Tuesday of the month 1pm-2pm, Journey Room

## 2020-07-26 NOTE — Progress Notes (Signed)
[place], [place] [address]   CLINIC:  Medical Oncology/Hematology  CONSULT NOTE  Patient Care Team: Jani Gravel, MD as PCP - General (Internal Medicine) Gala Romney [name], MD as Consulting Physician (Gastroenterology) Dishmon, Garwin Brothers, RN as Oncology Nurse Navigator (Oncology)  CHIEF COMPLAINTS/PURPOSE OF CONSULTATION:  Evaluation of cervical mass  HISTORY OF PRESENTING ILLNESS:  [name] 44 y.o. male is here because of evaluation of cervical mass, at the request of Ferdinand Lango, NP from [facility].  Today he is accompanied by his wife, [name]. He reports that he started feeling knots in his right side of his neck in September. He has lost about 22 lbs in 3 months. He has been having difficulty swallowing food, especially solids, and will vomit it back up if he takes a big bite. His appetite has been reduced to 1 meal a day in the past 3 months and he is not drinking Boost or Ensure.  He continues smoking 1 PPD for 30 years and he drinks 3 cans of beer for 8 years. He denies any history of cancer in his family. He used to work in Consulting civil engineer and is unaware of exposure to asbestos.   MEDICAL HISTORY:  Past Medical History:  Diagnosis Date  . Back pain   . Bradycardia 08/17/2011  . COPD (chronic obstructive pulmonary disease) ([place])   . ETOH abuse   . GERD (gastroesophageal reflux disease)   . Macrocytosis 08/17/2011  . Pancreatitis   . Pneumonia   . SAH (subarachnoid hemorrhage) ([place])   . Seizures ([place])    last one 6 mos ago    SURGICAL HISTORY: Past Surgical History:  Procedure Laterality Date  . BACK SURGERY    . ESOPHAGOGASTRODUODENOSCOPY (EGD) WITH PROPOFOL N/A 08/14/2017   Procedure: ESOPHAGOGASTRODUODENOSCOPY (EGD) WITH PROPOFOL;  Surgeon: Daneil Dolin, MD;  [facility];  Service: Endoscopy;  Laterality: N/A;  1:30pm  . ESOPHAGOGASTRODUODENOSCOPY (EGD) WITH PROPOFOL N/A 03/13/2018   Procedure:  ESOPHAGOGASTRODUODENOSCOPY (EGD) WITH PROPOFOL;  Surgeon: Daneil Dolin, MD;  Location: [facility];  Service: Endoscopy;  Laterality: N/A;  2:30pm  . SEPTOPLASTY      SOCIAL HISTORY: Social History   Socioeconomic History  . Marital status: Single    Spouse name: Not on file  . Number of children: Not on file  . Years of education: Not on file  . Highest education level: Not on file  Occupational History  . Not on file  Tobacco Use  . Smoking status: Current Every Day Smoker    Packs/day: 1.00    Years: 20.00    Pack years: 20.00    Types: Cigarettes  . Smokeless tobacco: Never Used  Vaping Use  . Vaping Use: Never used  Substance and Sexual Activity  . Alcohol use: Yes    Comment: couple of beers daily  . Drug use: Not Currently    Types: Marijuana    Comment: months   . Sexual activity: Not Currently  Other Topics Concern  . Not on file  Social History Narrative  . Not on file   Social Determinants of Health   Financial Resource Strain:   . Difficulty of Paying Living Expenses: Not on file  Food Insecurity:   . Worried About Charity fundraiser in the Last Year: Not on file  . Ran Out of Food in the Last Year: Not on file  Transportation Needs:   . Lack of Transportation (  Medical): Not on file  . Lack of Transportation (Non-Medical): Not on file  Physical Activity:   . Days of Exercise per Week: Not on file  . Minutes of Exercise per Session: Not on file  Stress:   . Feeling of Stress : Not on file  Social Connections:   . Frequency of Communication with Friends and Family: Not on file  . Frequency of Social Gatherings with Friends and Family: Not on file  . Attends Religious Services: Not on file  . Active Member of Clubs or Organizations: Not on file  . Attends Archivist Meetings: Not on file  . Marital Status: Not on file  Intimate Partner Violence:   . Fear of Current or Ex-Partner: Not on file  . Emotionally Abused: Not on file  .  Physically Abused: Not on file  . Sexually Abused: Not on file    FAMILY HISTORY: Family History  Problem Relation Age of Onset  . Coronary artery disease Mother        deceased age 32  . Seizures Father   . Alcohol abuse Father   . Colon cancer Neg Hx     ALLERGIES:  has No Known Allergies.  MEDICATIONS:  Current Outpatient Medications  Medication Sig Dispense Refill  . albuterol (PROVENTIL HFA;VENTOLIN HFA) 108 (90 BASE) MCG/ACT inhaler Inhale 2 puffs into the lungs every 6 (six) hours as needed for wheezing or shortness of breath.    Marland Kitchen amLODipine (NORVASC) 10 MG tablet TAKE (1) TABLET BY MOUTHCONCE DAILY FOR HYPERTENSION.    . carbamazepine (TEGRETOL) 200 MG tablet Take 400 mg by mouth 2 (two) times daily.    Marland Kitchen gabapentin (NEURONTIN) 300 MG capsule Take 300 mg by mouth 3 (three) times daily.    Marland Kitchen ibuprofen (ADVIL) 400 MG tablet Take 1 tablet (400 mg total) by mouth every 6 (six) hours as needed. 30 tablet 0  . levETIRAcetam (KEPPRA) 500 MG tablet Take 500 mg by mouth 2 (two) times daily.     . Oxycodone HCl 10 MG TABS Take 10 mg by mouth 2 (two) times daily. My take an additional 5 mg mid day as needed for pain    . pantoprazole (PROTONIX) 40 MG tablet Take 1 tablet (40 mg total) by mouth 2 (two) times daily before a meal. 60 tablet 5  . tiotropium (SPIRIVA) 18 MCG inhalation capsule Place 18 mcg into inhaler and inhale daily.     No current facility-administered medications for this visit.    REVIEW OF SYSTEMS:   Review of Systems  Constitutional: Positive for appetite change (50%) and fatigue (50%).  HENT:   Positive for trouble swallowing.   Musculoskeletal: Positive for back pain (8/10 back, hip and leg pain) and neck pain (10/10 neck pain).  Neurological: Positive for headaches.  All other systems reviewed and are negative.    PHYSICAL EXAMINATION: ECOG PERFORMANCE STATUS: 1 - Symptomatic but completely ambulatory  Vitals:   07/26/20 1550  BP: (!) 148/82    Pulse: 82  Resp: 20  Temp: (!) 97.5 F (36.4 C)  SpO2: 94%   Filed Weights   07/26/20 1550  Weight: 108 lb 8 oz (49.2 kg)   Physical Exam Vitals reviewed.  Constitutional:      Appearance: Normal appearance.  HENT:     Mouth/Throat:     Lips: No lesions.     Mouth: No oral lesions.     Tongue: No lesions.     Palate: No mass.  Pharynx: No pharyngeal swelling.     Tonsils: No tonsillar abscesses.  Lymphadenopathy:     Cervical: Cervical adenopathy (2 cm hard LN on anterior right side of neck at C2; 1 cm hard LN on anterior left side of neck at C2) present.  Neurological:     General: No focal deficit present.     Mental Status: He is alert and oriented to person, place, and time.  Psychiatric:        Mood and Affect: Mood normal.        Behavior: Behavior normal.      LABORATORY DATA:  I have reviewed the data as listed CBC Latest Ref Rng & Units 09/04/2019 03/11/2018 08/12/2017  WBC 4.0 - 10.5 K/uL 11.4(H) 4.8 12.2(H)  Hemoglobin 13.0 - 17.0 g/dL 14.5 14.1 12.4(L)  Hematocrit 39 - 52 % 43.0 41.3 36.7(L)  Platelets 150 - 400 K/uL 237 196 420(H)   CMP Latest Ref Rng & Units 09/04/2019 03/11/2018 08/12/2017  Glucose 70 - 99 mg/dL 187(H) 106(H) 121(H)  BUN 6 - 20 mg/dL 8 <5(L) 5(L)  Creatinine 0.61 - 1.24 mg/dL 0.42(L) 0.39(L) 0.41(L)  Sodium 135 - 145 mmol/L 133(L) 127(L) 130(L)  Potassium 3.5 - 5.1 mmol/L 3.9 4.0 3.9  Chloride 98 - 111 mmol/L 96(L) 89(L) 92(L)  CO2 22 - 32 mmol/L 24 27 28   Calcium 8.9 - 10.3 mg/dL 9.3 9.2 9.1  Total Protein 6.5 - 8.1 g/dL 7.9 - 7.6  Total Bilirubin 0.3 - 1.2 mg/dL 0.7 - 0.3  Alkaline Phos 38 - 126 U/L 93 - 125  AST 15 - 41 U/L 33 - 17  ALT 0 - 44 U/L 21 - 10(L)    RADIOGRAPHIC STUDIES: I have personally reviewed the radiological images as listed and agreed with the findings in the report.  ASSESSMENT:  1.  Bilateral neck lymphadenopathy: -Reports noting knots in the neck for the last 3 to 4 months. -Ultrasound of the  neck on 04/28/2020 with 2.8 x 1.7 cm mass on the right neck and several relatively small hypoechoic lymph nodes in the left side. -Weight loss of 22 pounds in the last 3 months.  No fevers or night sweats. -Positive for dysphagia.  2.  Social/family history: -Worked in the roofing, now on disability. -1 pack/day for 30 years, active smoker. -3 beers per day for the last 10 years.   PLAN:  1.  Bilateral neck lymphadenopathy: -He has a tender left level 2 lymph node measuring 1 to 2 cm.  He also has 3 to 4 cm lymph node on the right neck which is also slightly tender.  No palpable thyroid mass. -Oropharyngeal exam did not show any visual masses. -Recommend ENT evaluation, fiberoptic exam and biopsy. -Recommend PET CT scan as soon as possible. -RTC after the PET scan.  2.  Weight loss: -Has occasional dysphagia.  Eating 1 sandwich per day. -We will make a referral to nutrition.    All questions were answered. The patient knows to call the clinic with any problems, questions or concerns.   Derek Jack, MD, 07/26/20 5:00 PM  [place] [phone]   I, Milinda Antis, am acting as a scribe for Dr. Sanda Linger.  I, Derek Jack MD, have reviewed the above documentation for accuracy and completeness, and I agree with the above.

## 2020-07-27 ENCOUNTER — Encounter (HOSPITAL_COMMUNITY): Payer: Self-pay | Admitting: Lab

## 2020-07-27 NOTE — Progress Notes (Unsigned)
Referral to Dr Teoh/  appt 10/1 @1 .  They will call pt with appt

## 2020-08-02 ENCOUNTER — Other Ambulatory Visit (INDEPENDENT_AMBULATORY_CARE_PROVIDER_SITE_OTHER): Payer: Self-pay | Admitting: Otolaryngology

## 2020-08-05 ENCOUNTER — Other Ambulatory Visit: Payer: Self-pay

## 2020-08-05 ENCOUNTER — Ambulatory Visit (HOSPITAL_COMMUNITY)
Admission: RE | Admit: 2020-08-05 | Discharge: 2020-08-05 | Disposition: A | Discharge status: Home / Self Care | Payer: Medicaid Other | Source: Ambulatory Visit | Attending: Hematology | Admitting: Hematology

## 2020-08-05 DIAGNOSIS — M7989 Other specified soft tissue disorders: Secondary | ICD-10-CM | POA: Insufficient documentation

## 2020-08-05 LAB — GLUCOSE, CAPILLARY: Glucose-Capillary: 107 mg/dL — ABNORMAL HIGH (ref 70–99)

## 2020-08-05 MED ORDER — FLUDEOXYGLUCOSE F - 18 (FDG) INJECTION
5.4300 | Freq: Once | INTRAVENOUS | Status: AC
  Administered 2020-08-05: 5.43 via INTRAVENOUS

## 2020-08-08 ENCOUNTER — Encounter (HOSPITAL_COMMUNITY): Payer: Self-pay | Admitting: Hematology

## 2020-08-08 ENCOUNTER — Other Ambulatory Visit: Payer: Self-pay

## 2020-08-08 ENCOUNTER — Inpatient Hospital Stay (HOSPITAL_COMMUNITY): Payer: Medicaid Other | Attending: Hematology | Admitting: Hematology

## 2020-08-08 ENCOUNTER — Other Ambulatory Visit (HOSPITAL_COMMUNITY): Payer: Self-pay | Admitting: *Deleted

## 2020-08-08 ENCOUNTER — Encounter (HOSPITAL_COMMUNITY): Payer: Self-pay | Admitting: Lab

## 2020-08-08 ENCOUNTER — Inpatient Hospital Stay (HOSPITAL_COMMUNITY): Payer: Medicaid Other

## 2020-08-08 VITALS — BP 148/81 | HR 96 | Temp 96.9°F | Resp 20 | Wt 107.8 lb

## 2020-08-08 DIAGNOSIS — C321 Malignant neoplasm of supraglottis: Secondary | ICD-10-CM | POA: Diagnosis present

## 2020-08-08 DIAGNOSIS — Z23 Encounter for immunization: Secondary | ICD-10-CM | POA: Insufficient documentation

## 2020-08-08 DIAGNOSIS — C01 Malignant neoplasm of base of tongue: Secondary | ICD-10-CM | POA: Diagnosis present

## 2020-08-08 DIAGNOSIS — M542 Cervicalgia: Secondary | ICD-10-CM | POA: Diagnosis not present

## 2020-08-08 DIAGNOSIS — C770 Secondary and unspecified malignant neoplasm of lymph nodes of head, face and neck: Secondary | ICD-10-CM | POA: Insufficient documentation

## 2020-08-08 DIAGNOSIS — F17210 Nicotine dependence, cigarettes, uncomplicated: Secondary | ICD-10-CM | POA: Insufficient documentation

## 2020-08-08 LAB — CBC WITH DIFFERENTIAL/PLATELET
Abs Immature Granulocytes: 0.06 10*3/uL (ref 0.00–0.07)
Basophils Absolute: 0.1 10*3/uL (ref 0.0–0.1)
Basophils Relative: 1 %
Eosinophils Absolute: 0.1 10*3/uL (ref 0.0–0.5)
Eosinophils Relative: 2 %
HCT: 42.7 % (ref 39.0–52.0)
Hemoglobin: 14.8 g/dL (ref 13.0–17.0)
Immature Granulocytes: 1 %
Lymphocytes Relative: 23 %
Lymphs Abs: 2 10*3/uL (ref 0.7–4.0)
MCH: 33.4 pg (ref 26.0–34.0)
MCHC: 34.7 g/dL (ref 30.0–36.0)
MCV: 96.4 fL (ref 80.0–100.0)
Monocytes Absolute: 1.3 10*3/uL — ABNORMAL HIGH (ref 0.1–1.0)
Monocytes Relative: 14 %
Neutro Abs: 5.3 10*3/uL (ref 1.7–7.7)
Neutrophils Relative %: 59 %
Platelets: 171 10*3/uL (ref 150–400)
RBC: 4.43 MIL/uL (ref 4.22–5.81)
RDW: 12.6 % (ref 11.5–15.5)
WBC: 8.7 10*3/uL (ref 4.0–10.5)
nRBC: 0 % (ref 0.0–0.2)

## 2020-08-08 LAB — COMPREHENSIVE METABOLIC PANEL
ALT: 16 U/L (ref 0–44)
AST: 32 U/L (ref 15–41)
Albumin: 3.9 g/dL (ref 3.5–5.0)
Alkaline Phosphatase: 90 U/L (ref 38–126)
Anion gap: 13 (ref 5–15)
BUN: <5 mg/dL — ABNORMAL LOW (ref 6–20)
CO2: 26 mmol/L (ref 22–32)
Calcium: 8.7 mg/dL — ABNORMAL LOW (ref 8.9–10.3)
Chloride: 83 mmol/L — ABNORMAL LOW (ref 98–111)
Creatinine, Ser: 0.32 mg/dL — ABNORMAL LOW (ref 0.61–1.24)
GFR, Estimated: >60 mL/min (ref >60)
Glucose, Bld: 112 mg/dL — ABNORMAL HIGH (ref 70–99)
Potassium: 3.4 mmol/L — ABNORMAL LOW (ref 3.5–5.1)
Sodium: 122 mmol/L — ABNORMAL LOW (ref 135–145)
Total Bilirubin: 0.7 mg/dL (ref 0.3–1.2)
Total Protein: 8.5 g/dL — ABNORMAL HIGH (ref 6.5–8.1)

## 2020-08-08 MED ORDER — OXYCODONE HCL 5 MG PO TABS: 10.0000 mg | ORAL_TABLET | Freq: Four times a day (QID) | ORAL | 0 refills | Status: DC | PRN

## 2020-08-08 MED ORDER — INFLUENZA VAC SPLIT QUAD 0.5 ML IM SUSY
0.5000 mL | PREFILLED_SYRINGE | Freq: Once | INTRAMUSCULAR | Status: AC
  Administered 2020-08-08: 0.5 mL via INTRAMUSCULAR
  Filled 2020-08-08: qty 0.5

## 2020-08-08 MED ORDER — LEVOFLOXACIN 500 MG PO TABS: 500.0000 mg | ORAL_TABLET | Freq: Every day | ORAL | 0 refills | Status: DC

## 2020-08-08 NOTE — Patient Instructions (Addendum)
[place] at [facility] Discharge Instructions  You were seen today by Dr. Delton Coombes. 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 Your cancer will require weekly chemo and radiation therapy. You will be referred to a radiation oncologist in [place] to start radiation, a dietician for nutritional guidance and a general surgeon for port and PEG placement. You will be prescribed levofloxacin 500 mg to take daily for 7 days. You will also be prescribed oxycodone 5 mg to take 2 tablets every 6 hours as needed for the pain. Dr. Delton Coombes will see you back in 2 weeks for labs and follow up.   Thank you for choosing [place] at [facility] to provide your oncology and hematology care.  To afford each patient quality time with our provider, please arrive at least 15 minutes before your scheduled appointment time.   If you have a lab appointment with the [place] [facility] please come in thru the Main Entrance and check in at the main information desk  You need to re-schedule your appointment should you arrive 10 or more minutes late.  We strive to give you quality time with our providers, and arriving late affects you and other patients whose appointments are after yours.  Also, if you no show three or more times for appointments you may be dismissed from the clinic at the providers discretion.     Again, thank you for choosing [name].  Our hope is that these requests will decrease the amount of time that you wait before being seen by our physicians.       _____________________________________________________________  Should you have questions after your visit to [facility], please contact our office at [phone] between the hours of 8:00 a.m. and 4:30 p.m.  Voicemails left after 4:00 p.m. will not be returned until the following business day.  For  prescription refill requests, have your pharmacy contact our office and allow 72 hours.    Cancer Center Support Programs:   > Cancer Support Group  2nd Tuesday of the month 1pm-2pm, Journey Room

## 2020-08-08 NOTE — Progress Notes (Signed)
Patient tolerated flu vaccine with no complaints voiced.  Site clean and dry with no bruising or swelling noted at site.  Band aid applied.  VSS with discharge and left ambulatory with no s/s of distress noted. 

## 2020-08-08 NOTE — Progress Notes (Signed)
[address], [place] [address]   CLINIC:  Medical Oncology/Hematology  PCP:  Jani Gravel, MD [address] / [place] [address] [phone]   REASON FOR VISIT:  Follow-up for epiglottic squamous cell carcinoma  PRIOR THERAPY: None  NGS Results: Not done  CURRENT THERAPY: Under work-up  BRIEF ONCOLOGIC HISTORY:  Oncology History   No history exists.    CANCER STAGING: Cancer Staging No matching staging information was found for the patient.  INTERVAL HISTORY:  [name], a 44 y.o. male, returns for routine follow-up of his epiglottic squamous cell carcinoma. Elbie was last seen on 07/26/2020.  Today he is accompanied by his wife. He complains of having pain in his right neck radiating up to his ear. He takes oxycodone 10 mg TID for his pain, prescribed by Ferdinand Lango, NP from [facility], but it still does not help with the pain and has been out of them for a while. He continues coughing and bringing up white sputum. He has trouble swallowing and is not drinking any Boost or Ensure. He reports having numbness in the toes of his left foot.  He reclines most of the day and sleeps; he denies drowsiness from oxycodone or extreme fatigue.   REVIEW OF SYSTEMS:  Review of Systems  Constitutional: Positive for appetite change (25%) and fatigue (25%).  HENT:   Positive for trouble swallowing.   Respiratory: Positive for cough (white sputum) and shortness of breath.   Gastrointestinal: Positive for constipation, nausea and vomiting.  Musculoskeletal: Positive for neck pain (8/10 R neck and ear pain, bilat sides of jaw).  Neurological: Positive for dizziness, headaches and numbness (L leg toes).  Psychiatric/Behavioral: Positive for sleep disturbance.  All other systems reviewed and are negative.   PAST MEDICAL/SURGICAL HISTORY:  Past Medical History:  Diagnosis Date  . Back pain   . Bradycardia 08/17/2011   . COPD (chronic obstructive pulmonary disease) ([place])   . ETOH abuse   . GERD (gastroesophageal reflux disease)   . Macrocytosis 08/17/2011  . Pancreatitis   . Pneumonia   . SAH (subarachnoid hemorrhage) ([place])   . Seizures ([place])    last one 6 mos ago   Past Surgical History:  Procedure Laterality Date  . BACK SURGERY    . ESOPHAGOGASTRODUODENOSCOPY (EGD) WITH PROPOFOL N/A 08/14/2017   Procedure: ESOPHAGOGASTRODUODENOSCOPY (EGD) WITH PROPOFOL;  Surgeon: Daneil Dolin, MD;  [facility];  Service: Endoscopy;  Laterality: N/A;  1:30pm  . ESOPHAGOGASTRODUODENOSCOPY (EGD) WITH PROPOFOL N/A 03/13/2018   Procedure: ESOPHAGOGASTRODUODENOSCOPY (EGD) WITH PROPOFOL;  Surgeon: Daneil Dolin, MD;  [facility];  Service: Endoscopy;  Laterality: N/A;  2:30pm  . SEPTOPLASTY      SOCIAL HISTORY:  Social History   Socioeconomic History  . Marital status: Single    Spouse name: Not on file  . Number of children: Not on file  . Years of education: Not on file  . Highest education level: Not on file  Occupational History  . Not on file  Tobacco Use  . Smoking status: Current Every Day Smoker    Packs/day: 1.00    Years: 20.00    Pack years: 20.00    Types: Cigarettes  . Smokeless tobacco: Never Used  Vaping Use  . Vaping Use: Never used  Substance and Sexual Activity  . Alcohol use: Yes    Comment: couple of beers daily  . Drug use: Not Currently  Types: Marijuana    Comment: months   . Sexual activity: Not Currently  Other Topics Concern  . Not on file  Social History Narrative  . Not on file   Social Determinants of Health   Financial Resource Strain:   . Difficulty of Paying Living Expenses: Not on file  Food Insecurity:   . Worried About Charity fundraiser in the Last Year: Not on file  . Ran Out of Food in the Last Year: Not on file  Transportation Needs:   . Lack of Transportation (Medical): Not on file  . Lack of Transportation  (Non-Medical): Not on file  Physical Activity:   . Days of Exercise per Week: Not on file  . Minutes of Exercise per Session: Not on file  Stress:   . Feeling of Stress : Not on file  Social Connections:   . Frequency of Communication with Friends and Family: Not on file  . Frequency of Social Gatherings with Friends and Family: Not on file  . Attends Religious Services: Not on file  . Active Member of Clubs or Organizations: Not on file  . Attends Archivist Meetings: Not on file  . Marital Status: Not on file  Intimate Partner Violence:   . Fear of Current or Ex-Partner: Not on file  . Emotionally Abused: Not on file  . Physically Abused: Not on file  . Sexually Abused: Not on file    FAMILY HISTORY:  Family History  Problem Relation Age of Onset  . Coronary artery disease Mother        deceased age 25  . Seizures Father   . Alcohol abuse Father   . Colon cancer Neg Hx     CURRENT MEDICATIONS:  Current Outpatient Medications  Medication Sig Dispense Refill  . albuterol (PROVENTIL HFA;VENTOLIN HFA) 108 (90 BASE) MCG/ACT inhaler Inhale 2 puffs into the lungs every 6 (six) hours as needed for wheezing or shortness of breath.    Marland Kitchen amLODipine (NORVASC) 10 MG tablet TAKE (1) TABLET BY MOUTHCONCE DAILY FOR HYPERTENSION.    . carbamazepine (TEGRETOL) 200 MG tablet Take 400 mg by mouth 2 (two) times daily.    . diclofenac Sodium (VOLTAREN) 1 % GEL Apply topically.    . gabapentin (NEURONTIN) 300 MG capsule Take 300 mg by mouth 3 (three) times daily.    Marland Kitchen ibuprofen (ADVIL) 400 MG tablet Take 1 tablet (400 mg total) by mouth every 6 (six) hours as needed. 30 tablet 0  . levETIRAcetam (KEPPRA) 500 MG tablet Take 500 mg by mouth 2 (two) times daily.     . Oxycodone HCl 10 MG TABS Take 10 mg by mouth 2 (two) times daily. My take an additional 5 mg mid day as needed for pain    . pantoprazole (PROTONIX) 40 MG tablet Take 1 tablet (40 mg total) by mouth 2 (two) times daily before  a meal. 60 tablet 5  . tiotropium (SPIRIVA) 18 MCG inhalation capsule Place 18 mcg into inhaler and inhale daily.     No current facility-administered medications for this visit.    ALLERGIES:  No Known Allergies  PHYSICAL EXAM:  Performance status (ECOG): 1 - Symptomatic but completely ambulatory  Vitals:   08/08/20 0948  BP: (!) 148/81  Pulse: 96  Resp: 20  Temp: (!) 96.9 F (36.1 C)  SpO2: 95%   Wt Readings from Last 3 Encounters:  08/08/20 107 lb 12.8 oz (48.9 kg)  07/26/20 108 lb 8 oz (49.2  kg)  09/04/19 120 lb (54.4 kg)   Physical Exam Vitals reviewed.  Constitutional:      Appearance: Normal appearance. He is toxic-appearing.  Cardiovascular:     Rate and Rhythm: Normal rate and regular rhythm.     Pulses: Normal pulses.     Heart sounds: Normal heart sounds.  Pulmonary:     Effort: Pulmonary effort is normal.     Breath sounds: Normal breath sounds. Transmitted upper airway sounds present.  Lymphadenopathy:     Cervical: Cervical adenopathy (5 cm fixed LN on R neck; 2 cm LN beneath L angle of jaw) present.  Neurological:     General: No focal deficit present.     Mental Status: He is alert and oriented to person, place, and time.  Psychiatric:        Mood and Affect: Mood normal.        Behavior: Behavior normal.      LABORATORY DATA:  I have reviewed the labs as listed.  CBC Latest Ref Rng & Units 09/04/2019 03/11/2018 08/12/2017  WBC 4.0 - 10.5 K/uL 11.4(H) 4.8 12.2(H)  Hemoglobin 13.0 - 17.0 g/dL 14.5 14.1 12.4(L)  Hematocrit 39 - 52 % 43.0 41.3 36.7(L)  Platelets 150 - 400 K/uL 237 196 420(H)   CMP Latest Ref Rng & Units 09/04/2019 03/11/2018 08/12/2017  Glucose 70 - 99 mg/dL 187(H) 106(H) 121(H)  BUN 6 - 20 mg/dL 8 <5(L) 5(L)  Creatinine 0.61 - 1.24 mg/dL 0.42(L) 0.39(L) 0.41(L)  Sodium 135 - 145 mmol/L 133(L) 127(L) 130(L)  Potassium 3.5 - 5.1 mmol/L 3.9 4.0 3.9  Chloride 98 - 111 mmol/L 96(L) 89(L) 92(L)  CO2 22 - 32 mmol/L 24 27 28   Calcium  8.9 - 10.3 mg/dL 9.3 9.2 9.1  Total Protein 6.5 - 8.1 g/dL 7.9 - 7.6  Total Bilirubin 0.3 - 1.2 mg/dL 0.7 - 0.3  Alkaline Phos 38 - 126 U/L 93 - 125  AST 15 - 41 U/L 33 - 17  ALT 0 - 44 U/L 21 - 10(L)   Cytology (Accession [id number]) on 08/02/2020: Right neck mass FNA: squamous cell carcinoma.  DIAGNOSTIC IMAGING:  I have independently reviewed the scans and discussed with the patient. NM PET Image Initial (PI) Skull Base To Thigh  Result Date: 08/05/2020 CLINICAL DATA:  Initial treatment strategy for right-sided neck mass on ultrasound. No COVID-19 vaccine. EXAM: NUCLEAR MEDICINE PET SKULL BASE TO THIGH TECHNIQUE: 5.4 mCi F-18 FDG was injected intravenously. Full-ring PET imaging was performed from the skull base to thigh after the radiotracer. CT data was obtained and used for attenuation correction and anatomic localization. Fasting blood glucose: 107 mg/dl COMPARISON:  Ultrasound of the neck of 04/28/2020. 08/12/2017 abdominopelvic CT. Chest CT 10/26/2012. FINDINGS: Mediastinal blood pool activity: SUV max 1.6 Liver activity: SUV max NA NECK: Likely corresponding the ultrasound abnormality, a right-sided level 2 nodal mass measures 2.7 x 2.2 cm on 39/4. Demonstrates minimal central photopenia of necrosis and peripheral hypermetabolism at a S.U.V. max of 8.3. A left-sided level 2 node measures 8 mm and a S.U.V. max of 5.3 on 34/4. The epiglottis is thickened and irregular with correlate hypermetabolism. a S.U.V. max of 11.2 on 38/4. Incidental CT findings: No other cervical adenopathy. Dense, markedly age advanced carotid atherosclerosis. Mucosal thickening of both maxillary sinuses. Fluid in the right sphenoid sinus. CHEST: Bilateral areas of primarily peribronchovascular reticulonodular opacity and correlate hypermetabolism. Example within the posterior left upper lobe measuring a S.U.V. max of 4.6 on 69/4. Interstitial thickening  measuring 9 mm and a S.U.V. max of 5.9 within the posterior right  upper lobe on 72/4. Small middle mediastinal nodes with low-level hypermetabolism. Example in the AP window at 7 mm and a S.U.V. max of 2.1 on 76/4. Incidental CT findings: Aortic atherosclerosis. Markedly age advanced multivessel coronary artery atherosclerosis. Moderate centrilobular and paraseptal emphysema. Suspect scarring within the left apex. ABDOMEN/PELVIS: No abdominopelvic parenchymal or nodal hypermetabolism. Incidental CT findings: Suspicion of mild cirrhosis, as evidenced by caudate and lateral segment left liver lobe enlargement. Focal steatosis adjacent the falciform ligament. Chronic calcific pancreatitis. Normal adrenal glands. Advanced abdominal aortic atherosclerosis. Beam hardening artifact from metallic foreign body posterior to the left hip and ischium, of indeterminate etiology. SKELETON: No abnormal marrow activity. Incidental CT findings: Osteopenia. T7 and T9 compression deformities are similar to the 2013 CT. There is also likely a mild T5 superior endplate compression deformity. IMPRESSION: 1. Epiglottic soft tissue thickening and hypermetabolism, likely mucosal primary. Right larger than left level 2 cervical nodal metastasis. 2. Extensive peribronchovascular reticulonodular opacities and hypermetabolism. Favor infection, including atypical etiologies. Not a good distribution for aspiration. Metastatic disease felt unlikely. 3. Otherwise, no extracervical metastatic disease identified. Thoracic nodes with low-level hypermetabolism are favored to be reactive. 4. Aortic Atherosclerosis (ICD10-I70.0). Markedly age advanced coronary artery atherosclerosis. Recommend assessment of coronary risk factors and consideration of medical therapy. 5.  Emphysema (ICD10-J43.9). 6. Chronic calcific pancreatitis. Hepatic steatosis and probable cirrhosis. 7. Osteopenia with chronic thoracic compression deformities. 8. Sinus disease. Electronically Signed   By: Abigail Miyamoto M.D.   On: 08/05/2020 15:59       ASSESSMENT:  1.  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 -Ultrasound of the neck on 04/28/2020 with 2.8 x 1.7 cm mass on the right neck and several relatively small hypoechoic lymph nodes in the left side. -Weight loss of 22 pounds in the last 3 months.  No fevers or night sweats. -Positive for dysphagia. -PET CT scan on 08/05/2020 shows epiglottis is thickened and irregular with hypermetabolic.  Left level 2 lymph node positive.  Right-sided level 2 nodal metastasis measuring 2.7 x 2.2 cm.  Bilateral areas of peribronchovascular reticulonodular opacities with hypermetabolism favoring infection.  No extra cervical metastatic disease.  Thoracic nodes with low-level of hypermetabolism favored to be reactive. -Fiberoptic exam by Dr. Benjamine Mola on 08/02/2020 showed tongue base mass noted that involved the superior aspect of the epiglottis.  Arytenoid mucosa was edematous with slight erythema.  True vocal cords were pale yellow and edematous but without mass or lesion. -FNA of the right neck lymph node on 08/02/2020 consistent with squamous cell carcinoma.  2.  Social/family history: -Worked in the roofing, now on disability. -1 pack/day for 30 years, active smoker. -3 beers per day for the last 10 years.   PLAN:  1.  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  We will request HPV p16 testing. -We have reviewed images of the PET CT scan.  Hypermetabolic areas in the lung does not show lesions consistent with meta stasis. -I have recommended combination chemoradiation therapy. -He is unlikely a good candidate for high-dose cisplatin.  Hence recommended weekly cisplatin throughout the course of radiation. -I have given Levaquin 500 milligrams for 7 days for the lung lesions. -We will make a referral to radiation oncology. -We will also make  referral to general  surgery for port placement and PEG tube placement.  2.  Weight loss: -He has occasional dysphagia and is able to eat only 1 meal per day. -Recommend PEG tube placement upfront. -Recommend dietary evaluation.  3.  Right neck pain: -Pain is more pronounced since the biopsy. -We will increase his oxycodone 10 mg to every 6 hours as needed.  New prescription was given.   Orders placed this encounter:  No orders of the defined types were placed in this encounter.    Derek Jack, MD [place] [phone]   I, Milinda Antis, am acting as a scribe for Dr. Sanda Linger.  I, Derek Jack MD, have reviewed the above documentation for accuracy and completeness, and I agree with the above.

## 2020-08-08 NOTE — Progress Notes (Unsigned)
Referral sent to [facility].  Records faxed on 10/11

## 2020-08-09 ENCOUNTER — Encounter: Payer: Self-pay | Admitting: General Surgery

## 2020-08-09 ENCOUNTER — Other Ambulatory Visit (HOSPITAL_COMMUNITY): Payer: Self-pay | Admitting: *Deleted

## 2020-08-09 ENCOUNTER — Ambulatory Visit (INDEPENDENT_AMBULATORY_CARE_PROVIDER_SITE_OTHER): Payer: Medicaid Other | Admitting: General Surgery

## 2020-08-09 ENCOUNTER — Ambulatory Visit (HOSPITAL_COMMUNITY): Payer: Medicaid Other | Admitting: Speech Pathology

## 2020-08-09 VITALS — BP 119/70 | HR 86 | Temp 97.8°F | Resp 18 | Ht 67.0 in | Wt 107.0 lb

## 2020-08-09 DIAGNOSIS — C321 Malignant neoplasm of supraglottis: Secondary | ICD-10-CM | POA: Diagnosis not present

## 2020-08-09 NOTE — Progress Notes (Signed)
[name]; [id number]; 1976/05/27   HPI Patient is a 44 year old white male recently diagnosed with squamous cell carcinoma of the epiglottis who was referred to my care by Dr. Delton Coombes for Port-A-Cath insertion and PEG tube placement.  Patient has had weight loss and dysphagia.  He is able to swallow his secretions.  He will be starting chemotherapy in early November.  Patient states he does have some difficulty swallowing.  He is able to take liquids down. Past Medical History:  Diagnosis Date  . Back pain   . Bradycardia 08/17/2011  . COPD (chronic obstructive pulmonary disease) ([place])   . ETOH abuse   . GERD (gastroesophageal reflux disease)   . Macrocytosis 08/17/2011  . Pancreatitis   . Pneumonia   . SAH (subarachnoid hemorrhage) ([place])   . Seizures ([place])    last one 6 mos ago    Past Surgical History:  Procedure Laterality Date  . BACK SURGERY    . ESOPHAGOGASTRODUODENOSCOPY (EGD) WITH PROPOFOL N/A 08/14/2017   Procedure: ESOPHAGOGASTRODUODENOSCOPY (EGD) WITH PROPOFOL;  Surgeon: Daneil Dolin, MD;  [facility];  Service: Endoscopy;  Laterality: N/A;  1:30pm  . ESOPHAGOGASTRODUODENOSCOPY (EGD) WITH PROPOFOL N/A 03/13/2018   Procedure: ESOPHAGOGASTRODUODENOSCOPY (EGD) WITH PROPOFOL;  Surgeon: Daneil Dolin, MD;  [facility];  Service: Endoscopy;  Laterality: N/A;  2:30pm  . SEPTOPLASTY      Family History  Problem Relation Age of Onset  . Coronary artery disease Mother        deceased age 69  . Seizures Father   . Alcohol abuse Father   . Colon cancer Neg Hx     Current Outpatient Medications on File Prior to Visit  Medication Sig Dispense Refill  . albuterol (PROVENTIL HFA;VENTOLIN HFA) 108 (90 BASE) MCG/ACT inhaler Inhale 2 puffs into the lungs every 6 (six) hours as needed for wheezing or shortness of breath.    Marland Kitchen amLODipine (NORVASC) 10 MG tablet TAKE (1) TABLET BY MOUTHCONCE DAILY FOR HYPERTENSION.    . carbamazepine (TEGRETOL) 200  MG tablet Take 400 mg by mouth 2 (two) times daily.    . diclofenac Sodium (VOLTAREN) 1 % GEL Apply topically.    . gabapentin (NEURONTIN) 300 MG capsule Take 300 mg by mouth 3 (three) times daily.    Marland Kitchen ibuprofen (ADVIL) 400 MG tablet Take 1 tablet (400 mg total) by mouth every 6 (six) hours as needed. 30 tablet 0  . levETIRAcetam (KEPPRA) 500 MG tablet Take 500 mg by mouth 2 (two) times daily.     Marland Kitchen levofloxacin (LEVAQUIN) 500 MG tablet Take 1 tablet (500 mg total) by mouth daily. 7 tablet 0  . oxyCODONE (OXY IR/ROXICODONE) 5 MG immediate release tablet Take 2 tablets (10 mg total) by mouth every 6 (six) hours as needed for severe pain. 240 tablet 0  . pantoprazole (PROTONIX) 40 MG tablet Take 1 tablet (40 mg total) by mouth 2 (two) times daily before a meal. 60 tablet 5  . tiotropium (SPIRIVA) 18 MCG inhalation capsule Place 18 mcg into inhaler and inhale daily.     No current facility-administered medications on file prior to visit.    No Known Allergies  Social History   Substance and Sexual Activity  Alcohol Use Yes   Comment: couple of beers daily    Social History   Tobacco Use  Smoking Status Current Every Day Smoker  . Packs/day: 1.00  . Years: 20.00  . Pack years: 20.00  . Types: Cigarettes  Smokeless Tobacco Never Used    Review of Systems  Constitutional: Negative.   HENT: Negative.   Eyes: Negative.   Respiratory: Negative.   Cardiovascular: Negative.   Gastrointestinal: Positive for abdominal pain, heartburn and nausea.  Genitourinary: Negative.   Musculoskeletal: Positive for back pain, joint pain and neck pain.  Skin: Negative.   Neurological: Positive for sensory change and headaches.  Endo/Heme/Allergies: Negative.   Psychiatric/Behavioral: Negative.     Objective   Vitals:   08/09/20 1321  BP: 119/70  Pulse: 86  Resp: 18  Temp: 97.8 F (36.6 C)  SpO2: 91%    Physical Exam Vitals reviewed.  Constitutional:      Appearance: Normal  appearance. He is not ill-appearing.  HENT:     Head: Normocephalic and atraumatic.  Cardiovascular:     Rate and Rhythm: Normal rate and regular rhythm.     Heart sounds: Normal heart sounds. No murmur heard.  No friction rub. No gallop.   Pulmonary:     Effort: Pulmonary effort is normal. No respiratory distress.     Breath sounds: Normal breath sounds. No stridor. No wheezing, rhonchi or rales.  Abdominal:     General: Abdomen is flat. There is no distension.     Palpations: Abdomen is soft. There is no mass.     Tenderness: There is no abdominal tenderness. There is no guarding or rebound.     Hernia: No hernia is present.  Musculoskeletal:     Cervical back: Neck supple.  Lymphadenopathy:     Cervical: Cervical adenopathy present.  Skin:    General: Skin is warm and dry.  Neurological:     Mental Status: He is alert and oriented to person, place, and time.   Oncology notes reviewed  Assessment  Squamous cell carcinoma of epiglottis, dysphagia, weight loss, need for chemotherapy Plan   Patient is scheduled for EGD with PEG, Port-A-Cath placement on 08/22/2020.  The risks and benefits of the procedure including bleeding, infection, pneumothorax, bowel injury, the possibility of not being able to complete the PEG were fully explained to the patient, who gave informed consent.

## 2020-08-09 NOTE — Patient Instructions (Signed)
Gastrostomy Tube Home Guide, Adult A gastrostomy tube, or G-tube, is a tube that is inserted through the abdomen into the stomach. The tube is used to give feedings and medicines when a person is unable to eat and drink enough on his or her own. How to care for a G-tube Supplies needed  Saline solution or clean, warm water and soap.  Cotton swab or gauze.  Precut gauze bandage (dressing) and tape, if needed. Instructions 1. Wash your hands with soap and water. 2. If there is a dressing between the person's skin and the tube, remove it. 3. Check the area where the tube enters the skin. Check for problems such as: ? Redness. ? Swelling. ? Pus-like drainage. ? Extra skin growth. 4. Moisten the cotton swab with the saline solution or soap and water mixture. Gently clean around the insertion site. Remove any drainage or crusting. ? When the G-tube is first put in, a normal saline solution or water can be used to clean the skin. ? Mild soap and warm water can be used when the skin around the G-tube site has healed. 5. If there should be a dressing between the person's skin and the tube, apply it at this time. How to flush a G-tube Flush the G-tube regularly to keep it from clogging. Flush it before and after feedings and as often as told by the health care provider. Supplies needed  Purified or sterile water, warmed. If the person has a weak disease-fighting (immune) system, or if he or she has difficulty fighting off infections (is immunocompromised), use only sterile water. ? If you are unsure about the amount of chemical contaminants in purified or drinking water, use sterile water. ? To purify drinking water by boiling:  Boil water for at least 1 minute. Keep lid over water while it boils. Allow water to cool to room temperature before using.  60cc G-tube syringe. Instructions 1. Wash your hands with soap and water. 2. Draw up 30 mL of warm water in a syringe. 3. Connect the syringe  to the tube. 4. Slowly and gently push the water into the tube. G-tube problems and solutions  If the tube comes out: ? Cover the opening with a clean dressing and tape. ? Call a health care provider right away. ? A health care provider will need to put the tube back in within 4 hours.  If there is skin or scar tissue growing where the tube enters the skin: ? Keep the area clean and dry. ? Secure the tube with tape so that the tube does not move around too much. ? Call a health care provider.  If the tube gets clogged: ? Slowly push warm water into the tube with a large syringe. ? Do not force the fluid into the tube or push an object into the tube. ? If you are not able to unclog the tube, call a health care provider right away. Follow these instructions at home: Feedings  Give feedings at room temperature.  Cover and place unused feedings in the refrigerator.  If feedings are continuous: ? Do not put more than 4 hours worth of feedings in the feeding bag. ? Stop the feedings when you need to give medicine or flush the tube. Be sure to restart the feedings. ? Make sure the person's head is above his or her stomach (upright position). This will prevent choking and discomfort.  Replace feeding bags and syringes as told by the health care provider.  Make  sure the person is in the right position during and after feedings: ? During feedings, the person's position should be in the upright position. ? After a noncontinuous feeding (bolus feeding), have the person stay in the upright position for 1 hour. General instructions  Only use syringes made for G-tubes.  Do not pull or put tension on the tube.  Clamp the tube before removing the cap or disconnecting a syringe.  Measure the length of the G-tube every day from the insertion site to the end of the tube.  If the person's G-tube has a balloon, check the fluid in the balloon every week. The amount of fluid that should be in  the balloon can be found in the manufacturer's specifications.  Make sure the person takes care of his or her oral health, such as by brushing his or her teeth.  Remove excess air from the G-tube as told by the person's health care provider. This is called "venting."  Keep the area where the tube enters the skin clean and dry.  Do not push feedings, medicines, or flushes rapidly. Contact a health care provider if:  The person with the tube has any of these problems: ? Constipation. ? Fever.  There is a large amount of fluid or mucus-like liquid leaking from the tube.  Skin or scar tissue appears to be growing where the tube enters the skin.  The length of tube from the insertion site to the G-tube gets longer. Get help right away if:  The person with the tube has any of these problems: ? Severe abdominal pain. ? Severe tenderness. ? Severe bloating. ? Nausea. ? Vomiting. ? Trouble breathing. ? Shortness of breath.  Any of these problems happen in the area where the tube enters the skin: ? Redness, irritation, swelling, or soreness. ? Pus-like discharge. ? A bad smell.  The tube is clogged and cannot be flushed.  The tube comes out. Summary  A gastrostomy tube, or G-tube, is a tube that is inserted through the abdomen into the stomach. The tube is used to give feedings and medicines when a person is unable to eat and drink enough on his or her own.  Check and clean the insertion site daily as told by the person's health care provider.  Flush the G-tube regularly to keep it from clogging. Flush it before and after feedings and as often as told by the person's health care provider.  Keep the area where the tube enters the skin clean and dry. This information is not intended to replace advice given to you by your health care provider. Make sure you discuss any questions you have with your health care provider. Document Revised: 09/27/2017 Document Reviewed:  12/10/2016 Elsevier Patient Education  [place] An implanted port is a device that is placed under the skin. It is usually placed in the chest. The device can be used to give IV medicine, to take blood, or for dialysis. You may have an implanted port if:  You need IV medicine that would be irritating to the small veins in your hands or arms.  You need IV medicines, such as antibiotics, for a long period of time.  You need IV nutrition for a long period of time.  You need dialysis. Having a port means that your health care provider will not need to use the veins in your arms for these procedures. You may have fewer limitations when using a port than you would  if you used other types of long-term IVs, and you will likely be able to return to normal activities after your incision heals. An implanted port has two main parts:  Reservoir. The reservoir is the part where a needle is inserted to give medicines or draw blood. The reservoir is round. After it is placed, it appears as a small, raised area under your skin.  Catheter. The catheter is a thin, flexible tube that connects the reservoir to a vein. Medicine that is inserted into the reservoir goes into the catheter and then into the vein. How is my port accessed? To access your port:  A numbing cream may be placed on the skin over the port site.  Your health care provider will put on a mask and sterile gloves.  The skin over your port will be cleaned carefully with a germ-killing soap and allowed to dry.  Your health care provider will gently pinch the port and insert a needle into it.  Your health care provider will check for a blood return to make sure the port is in the vein and is not clogged.  If your port needs to remain accessed to get medicine continuously (constant infusion), your health care provider will place a clear bandage (dressing) over the needle site. The dressing and needle will  need to be changed every week, or as told by your health care provider. What is flushing? Flushing helps keep the port from getting clogged. Follow instructions from your health care provider about how and when to flush the port. Ports are usually flushed with saline solution or a medicine called heparin. The need for flushing will depend on how the port is used:  If the port is only used from time to time to give medicines or draw blood, the port may need to be flushed: ? Before and after medicines have been given. ? Before and after blood has been drawn. ? As part of routine maintenance. Flushing may be recommended every 4-6 weeks.  If a constant infusion is running, the port may not need to be flushed.  Throw away any syringes in a disposal container that is meant for sharp items (sharps container). You can buy a sharps container from a pharmacy, or you can make one by using an empty hard plastic bottle with a cover. How long will my port stay implanted? The port can stay in for as long as your health care provider thinks it is needed. When it is time for the port to come out, a surgery will be done to remove it. The surgery will be similar to the procedure that was done to put the port in. Follow these instructions at home:   Flush your port as told by your health care provider.  If you need an infusion over several days, follow instructions from your health care provider about how to take care of your port site. Make sure you: ? Wash your hands with soap and water before you change your dressing. If soap and water are not available, use alcohol-based hand sanitizer. ? Change your dressing as told by your health care provider. ? Place any used dressings or infusion bags into a plastic bag. Throw that bag in the trash. ? Keep the dressing that covers the needle clean and dry. Do not get it wet. ? Do not use scissors or sharp objects near the tube. ? Keep the tube clamped, unless it is  being used.  Check your port site every day  for signs of infection. Check for: ? Redness, swelling, or pain. ? Fluid or blood. ? Pus or a bad smell.  Protect the skin around the port site. ? Avoid wearing bra straps that rub or irritate the site. ? Protect the skin around your port from seat belts. Place a soft pad over your chest if needed.  Bathe or shower as told by your health care provider. The site may get wet as long as you are not actively receiving an infusion.  Return to your normal activities as told by your health care provider. Ask your health care provider what activities are safe for you.  Carry a medical alert card or wear a medical alert bracelet at all times. This will let health care providers know that you have an implanted port in case of an emergency. Get help right away if:  You have redness, swelling, or pain at the port site.  You have fluid or blood coming from your port site.  You have pus or a bad smell coming from the port site.  You have a fever. Summary  Implanted ports are usually placed in the chest for long-term IV access.  Follow instructions from your health care provider about flushing the port and changing bandages (dressings).  Take care of the area around your port by avoiding clothing that puts pressure on the area, and by watching for signs of infection.  Protect the skin around your port from seat belts. Place a soft pad over your chest if needed.  Get help right away if you have a fever or you have redness, swelling, pain, drainage, or a bad smell at the port site. This information is not intended to replace advice given to you by your health care provider. Make sure you discuss any questions you have with your health care provider. Document Revised: 02/06/2019 Document Reviewed: 11/17/2016 Elsevier Patient Education  2020 [employer].

## 2020-08-09 NOTE — H&P (Signed)
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 Delton Coombes for Port-A-Cath insertion and PEG tube placement.  Patient has had weight loss and dysphagia.  He is able to swallow his secretions.  He will be starting chemotherapy in early November.  Patient states he does have some difficulty swallowing.  He is able to take liquids down. Past Medical History:  Diagnosis Date  . Back pain   . Bradycardia 08/17/2011  . COPD (chronic obstructive pulmonary disease) ([place])   . ETOH abuse   . GERD (gastroesophageal reflux disease)   . Macrocytosis 08/17/2011  . Pancreatitis   . Pneumonia   . SAH (subarachnoid hemorrhage) ([place])   . Seizures ([place])    last one 6 mos ago    Past Surgical History:  Procedure Laterality Date  . BACK SURGERY    . ESOPHAGOGASTRODUODENOSCOPY (EGD) WITH PROPOFOL N/A 08/14/2017   Procedure: ESOPHAGOGASTRODUODENOSCOPY (EGD) WITH PROPOFOL;  Surgeon: Daneil Dolin, MD;  Location: [facility];  Service: Endoscopy;  Laterality: N/A;  1:30pm  . ESOPHAGOGASTRODUODENOSCOPY (EGD) WITH PROPOFOL N/A 03/13/2018   Procedure: ESOPHAGOGASTRODUODENOSCOPY (EGD) WITH PROPOFOL;  Surgeon: Daneil Dolin, MD;  Location: [facility];  Service: Endoscopy;  Laterality: N/A;  2:30pm  . SEPTOPLASTY      Family History  Problem Relation Age of Onset  . Coronary artery disease Mother        deceased age 24  . Seizures Father   . Alcohol abuse Father   . Colon cancer Neg Hx     Current Outpatient Medications on File Prior to Visit  Medication Sig Dispense Refill  . albuterol (PROVENTIL HFA;VENTOLIN HFA) 108 (90 BASE) MCG/ACT inhaler Inhale 2 puffs into the lungs every 6 (six) hours as needed for wheezing or shortness of breath.    Marland Kitchen amLODipine (NORVASC) 10 MG tablet TAKE (1) TABLET BY MOUTHCONCE DAILY FOR HYPERTENSION.    . carbamazepine (TEGRETOL) 200  MG tablet Take 400 mg by mouth 2 (two) times daily.    . diclofenac Sodium (VOLTAREN) 1 % GEL Apply topically.    . gabapentin (NEURONTIN) 300 MG capsule Take 300 mg by mouth 3 (three) times daily.    Marland Kitchen ibuprofen (ADVIL) 400 MG tablet Take 1 tablet (400 mg total) by mouth every 6 (six) hours as needed. 30 tablet 0  . levETIRAcetam (KEPPRA) 500 MG tablet Take 500 mg by mouth 2 (two) times daily.     Marland Kitchen levofloxacin (LEVAQUIN) 500 MG tablet Take 1 tablet (500 mg total) by mouth daily. 7 tablet 0  . oxyCODONE (OXY IR/ROXICODONE) 5 MG immediate release tablet Take 2 tablets (10 mg total) by mouth every 6 (six) hours as needed for severe pain. 240 tablet 0  . pantoprazole (PROTONIX) 40 MG tablet Take 1 tablet (40 mg total) by mouth 2 (two) times daily before a meal. 60 tablet 5  . tiotropium (SPIRIVA) 18 MCG inhalation capsule Place 18 mcg into inhaler and inhale daily.     No current facility-administered medications on file prior to visit.    No Known Allergies  Social History   Substance and Sexual Activity  Alcohol Use Yes   Comment: couple of beers daily    Social History   Tobacco Use  Smoking Status Current Every Day Smoker  . Packs/day: 1.00  . Years: 20.00  . Pack years: 20.00  . Types: Cigarettes  Smokeless Tobacco Never Used    Review of Systems  Constitutional: Negative.   HENT: Negative.   Eyes: Negative.   Respiratory: Negative.   Cardiovascular: Negative.   Gastrointestinal: Positive for abdominal pain, heartburn and nausea.  Genitourinary: Negative.   Musculoskeletal: Positive for back pain, joint pain and neck pain.  Skin: Negative.   Neurological: Positive for sensory change and headaches.  Endo/Heme/Allergies: Negative.   Psychiatric/Behavioral: Negative.     Objective   Vitals:   08/09/20 1321  BP: 119/70  Pulse: 86  Resp: 18  Temp: 97.8 F (36.6 C)  SpO2: 91%    Physical Exam Vitals reviewed.  Constitutional:      Appearance: Normal  appearance. He is not ill-appearing.  HENT:     Head: Normocephalic and atraumatic.  Cardiovascular:     Rate and Rhythm: Normal rate and regular rhythm.     Heart sounds: Normal heart sounds. No murmur heard.  No friction rub. No gallop.   Pulmonary:     Effort: Pulmonary effort is normal. No respiratory distress.     Breath sounds: Normal breath sounds. No stridor. No wheezing, rhonchi or rales.  Abdominal:     General: Abdomen is flat. There is no distension.     Palpations: Abdomen is soft. There is no mass.     Tenderness: There is no abdominal tenderness. There is no guarding or rebound.     Hernia: No hernia is present.  Musculoskeletal:     Cervical back: Neck supple.  Lymphadenopathy:     Cervical: Cervical adenopathy present.  Skin:    General: Skin is warm and dry.  Neurological:     Mental Status: He is alert and oriented to person, place, and time.   Oncology notes reviewed  Assessment  Squamous cell carcinoma of epiglottis, dysphagia, weight loss, need for chemotherapy Plan   Patient is scheduled for EGD with PEG, Port-A-Cath placement on 08/22/2020.  The risks and benefits of the procedure including bleeding, infection, pneumothorax, bowel injury, the possibility of not being able to complete the PEG were fully explained to the patient, who gave informed consent.

## 2020-08-11 ENCOUNTER — Encounter (HOSPITAL_COMMUNITY): Payer: Self-pay | Admitting: Speech Pathology

## 2020-08-11 ENCOUNTER — Other Ambulatory Visit: Payer: Self-pay

## 2020-08-11 ENCOUNTER — Ambulatory Visit (HOSPITAL_COMMUNITY): Payer: Medicaid Other | Attending: Hematology | Admitting: Speech Pathology

## 2020-08-11 ENCOUNTER — Other Ambulatory Visit (HOSPITAL_COMMUNITY): Payer: Self-pay

## 2020-08-11 DIAGNOSIS — R1312 Dysphagia, oropharyngeal phase: Secondary | ICD-10-CM | POA: Insufficient documentation

## 2020-08-11 DIAGNOSIS — C321 Malignant neoplasm of supraglottis: Secondary | ICD-10-CM

## 2020-08-11 NOTE — Progress Notes (Signed)
mo

## 2020-08-11 NOTE — Therapy (Signed)
[place], [place], [address] Phone: [phone]   Fax:  [phone]  Speech Language Pathology Evaluation/Clinical Swallow Evaluation  Patient Details  Name: [name] MRN: [id number] Date of Birth: Jan 24, 1976 No data recorded  Encounter Date: 08/11/2020   End of Session - 08/11/20 1326    Visit Number 1    Number of Visits 6    Date for SLP Re-Evaluation 10/12/20    Authorization Type Medicaid [employer]   27 visits per calendar year, [phone] does not require auth   SLP Start Time 1130    SLP Stop Time  1215    SLP Time Calculation (min) 45 min    Activity Tolerance Patient tolerated treatment well           Past Medical History:  Diagnosis Date  . Back pain   . Bradycardia 08/17/2011  . COPD (chronic obstructive pulmonary disease) ([place])   . ETOH abuse   . GERD (gastroesophageal reflux disease)   . Macrocytosis 08/17/2011  . Pancreatitis   . Pneumonia   . SAH (subarachnoid hemorrhage) ([place])   . Seizures ([place])    last one 6 mos ago    Past Surgical History:  Procedure Laterality Date  . BACK SURGERY    . ESOPHAGOGASTRODUODENOSCOPY (EGD) WITH PROPOFOL N/A 08/14/2017   Procedure: ESOPHAGOGASTRODUODENOSCOPY (EGD) WITH PROPOFOL;  Surgeon: Daneil Dolin, MD;  [facility];  Service: Endoscopy;  Laterality: N/A;  1:30pm  . ESOPHAGOGASTRODUODENOSCOPY (EGD) WITH PROPOFOL N/A 03/13/2018   Procedure: ESOPHAGOGASTRODUODENOSCOPY (EGD) WITH PROPOFOL;  Surgeon: Daneil Dolin, MD;  Location: [facility];  Service: Endoscopy;  Laterality: N/A;  2:30pm  . SEPTOPLASTY      There were no vitals filed for this visit.   Subjective Assessment - 08/11/20 1154    Patient is accompained by: Family member   Lavone Orn   Currently in Pain? Yes    Pain Score 8     Pain Location Head    Pain Orientation Right            Prior Functional Status - 08/11/20 1201      Prior Functional  Status   Cognitive/Linguistic Baseline Within functional limits    Type of Home House     Lives With Spouse    Available Help at Discharge Family    Education 8th grade    Vocation On disability           General - 08/11/20 1202      General Information   Date of Onset 07/26/20    HPI [name] is a 44 yo male who was referred for a clinical swallow evaluation by Dr. Derek Jack due to recent diagnosis of epiglottic squamous cell carcinoma. Pt had an ultrasound of his right neck for right neck mass 04/28/2020. PET CT scan on 08/05/2020 shows epiglottis is thickened and irregular with hypermetabolic.  Left level 2 lymph node positive.  Right-sided level 2 nodal metastasis measuring 2.7 x 2.2 cm.  Bilateral areas of peribronchovascular reticulonodular opacities with hypermetabolism favoring infection.  No extra cervical metastatic disease.  Thoracic nodes with low-level of hypermetabolism favored to be reactive. Fiberoptic exam by Dr. Benjamine Mola on 08/02/2020 showed tongue base mass noted that involved the superior aspect of the epiglottis.  Arytenoid mucosa was edematous with slight erythema.  True vocal cords were pale yellow and edematous but without mass or lesion. FNA of the right neck lymph node on  08/02/2020 consistent with squamous cell carcinoma. 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 He is an active smoker (1 pack/day for 30 years) and drinks 3 beers per day for the past 10 years. He will likely undergo chemoradiation therapy. He is scheduled for port placement and PEG tube on 08/22/2020 with Dr. Arnoldo Morale. He reports dysphagia to solid foods and has lost at least 23 pounds in the past few months. He reports that he typically only eats one meal per day.    Type of Study Bedside Swallow Evaluation    Previous Swallow Assessment None on record    Diet Prior to this Study Regular;Thin liquids    Temperature Spikes Noted No    Respiratory Status Room air    History of  Recent Intubation No    Behavior/Cognition Cooperative;Alert    Oral Cavity Assessment Within Functional Limits    Oral Care Completed by SLP No    Oral Cavity - Dentition Edentulous    Vision Functional for self-feeding    Self-Feeding Abilities Able to feed self    Patient Positioning Upright in chair    Baseline Vocal Quality Normal;Wet    Volitional Cough Strong    Volitional Swallow Able to elicit           EATING ASSESSMENT TOOL (EAT-10)   The patient was asked to rate to what extent the following statements are problematic on a scale of 0-4. 0 = No problem; 4 = Severe problem. A total score of 3 or higher is considered abnormal. My swallowing problem has caused me to lose weight. 3  My swallowing problem interferes with my ability to go out for meals. 4  Swallowing liquids takes extra effort. 0 Swallowing solids takes extra effort. 3  Swallowing pills takes extra effort. 1 Swallowing is painful. 2  The pleasure of eating is affected by my swallowing. 3  When I swallow food sticks in my throat. 3  I cough when I eat. 3 Swallowing is stressful. 2  TOTAL SCORE: 24 [X]  Abnormal (raw score >3) []  Normal      Oral Motor/Sensory Function - 08/11/20 1324      Oral Motor/Sensory Function   Overall Oral Motor/Sensory Function Mild impairment    Facial ROM Within Functional Limits    Facial Symmetry Within Functional Limits    Facial Strength Reduced right    Facial Sensation Within Functional Limits    Lingual ROM Within Functional Limits    Lingual Symmetry Within Functional Limits    Lingual Strength Within Functional Limits    Lingual Sensation Within Functional Limits    Velum Within Functional Limits    Mandible Within Functional Limits           Ice Chips - 08/11/20 1324      Ice Chips   Ice chips Not tested           Thin Liquid - 08/11/20 1324      Thin Liquid   Thin Liquid Impaired    Presentation Cup;Self Fed    Pharyngeal  Phase Impairments  Cough - Immediate             Puree - 08/11/20 1324      Puree   Puree Within functional limits    Presentation Self Fed;Spoon           Solid - 08/11/20 1325      Solid   Solid Impaired    Presentation Self Fed  Oral Phase Impairments Impaired mastication              SLP Short Term Goals - 08/11/20 1329      SLP SHORT TERM GOAL #1   Title Complete MBSS    Time 2    Period Weeks    Status New    Target Date 08/25/20      SLP SHORT TERM GOAL #2   Title Pt will verbalize 3+ signs/symptoms of possible aspiration with min cues from SLP and written source as needed.    Baseline Presented this date    Time 2    Period Months    Status New    Target Date 10/13/20      SLP SHORT TERM GOAL #3   Title Pt will complete oropharyngeal swallowing exercises x10 each after introduced by SLP and given mi/mod cues for accurate execution.    Baseline Will introduce after MBSS    Time 2    Period Months    Status New    Target Date 10/13/20            SLP Jose Term Goals - 08/11/20 1350      SLP Jose TERM GOAL #1   Title Pt will demonstrate safe and efficient consumption of self-regulated regular textures and unrestricted liquids with use of compensatory strategies as needed    Baseline Pt consuming soft solids with difficulty, all liquids    Time 3    Period Months    Status New    Target Date 11/11/20            Plan - 08/11/20 1328    Clinical Impression Statement Pt presents with signs and symptoms of reduced airway protection with coughing after thin liquids. He exhibited impaired mastication with solids (pt is edentulous) and WFL with puree. He has lost a significant amount of weight (usual body weight is 130 and he is now 107) and a PEG is planned for 08/22/2020. Pt scored 24 on the EAT-10 (see above), indicating significant swallowing problems. Will request order for MBSS to obtain current baseline objective assessment.  Because data states the risk  for dysphagia during and after radiation treatment is high due to undergoing radiation tx, SLP taught pt about the possibility of reduced/limited ability for PO intake during rad tx. SLP encouraged pt to continue swallowing POs as far into rad tx as possible, even ingesting POs and/or completing HEP shortly after administration of pain meds.   SLP educated pt re: changes to swallowing musculature after rad tx, and why adherence to dysphagia HEP provided today and PO consumption was necessary to inhibit muscular disuse atrophy and to reduce muscle fibrosis following rad tx. Pt demonstrated understanding of this information to SLP. Further education was provided regarding possible acute and late effects of radiation therapy including: xerostomia, dysgeusia, salivary changes, mucositis/esophagitis, dehydration, weight loss, fatigue, dysphagia, trismus, and lymphedema.    Speech Therapy Frequency 1x /week    Duration --   6 weeks   Treatment/Interventions Aspiration precaution training;SLP instruction and feedback;Compensatory strategies;Pharyngeal strengthening exercises;Diet toleration management by SLP;Compensatory techniques;Patient/family education    Potential to Achieve Goals Good    Potential Considerations Other (comment)   chemoradiation sequelae   SLP Home Exercise Plan Pt will completed HEP as assigned to facilitate carryover of treatment strategies and techniques in home environment with use of written cues    Consulted and Agree with Plan of Care Patient;Family member/caregiver    Family Member Consulted [place]  [place], significant other           Patient will benefit from skilled therapeutic intervention in order to improve the following deficits and impairments:   Dysphagia, oropharyngeal phase    Problem List Patient Active Problem List   Diagnosis Date Noted  . Abdominal pain, epigastric 12/31/2017  . Gastric ulcer 08/12/2017  . ETOH abuse   . Atypical chest pain   .  Hypercalcemia 10/08/2016  . Hypokalemia 10/08/2016  . Diarrhea 10/09/2015  . Pancreatitis, alcoholic, acute   . Alcohol-induced chronic pancreatitis ([place])   . Acute pancreatitis   . Pancreatitis 12/26/2014  . Pancreatic pseudocyst 12/26/2014  . Hyponatremia 12/26/2014  . HTN (hypertension) 07/27/2013  . Abdominal pain 07/11/2012  . Macrocytosis 08/17/2011  . Marijuana abuse 08/17/2011  . Bradycardia 08/17/2011  . Alcohol withdrawal ([place]) 08/17/2011  . Elevated blood pressure 07/08/2011  . Left against medical advice 07/08/2011  . Acute alcoholic pancreatitis 72/82/0601  . Alcohol abuse, continuous 07/07/2011  . Tobacco abuse 07/07/2011  . Seizure disorder ([place]) 07/07/2011  . Adynamic ileus ([place]) 07/07/2011  . Wheezing 07/07/2011  . COPD (chronic obstructive pulmonary disease) ([place]) 07/07/2011   Thank you,  Genene Churn, [place]  [place] 08/11/2020, 1:53 PM  [facility] [address] [place], [place], [address] Phone: [phone]   Fax:  [phone]  Name: [name] MRN: [id number] Date of Birth: 1976-08-28

## 2020-08-12 ENCOUNTER — Ambulatory Visit (HOSPITAL_COMMUNITY): Payer: Medicaid Other

## 2020-08-12 ENCOUNTER — Other Ambulatory Visit (HOSPITAL_COMMUNITY): Payer: Self-pay | Admitting: *Deleted

## 2020-08-12 NOTE — Progress Notes (Addendum)
Nutrition Assessment:  New head and neck cancer, weight loss, dysphagia  44 year old male with squamous cell carcinoma of epiglottis. Past medical history of bradycardia, COPD, etoh use, GERD, pancreatitis, seizures.    Planning concurrent chemotherapy and radiation therapy.    Spoke with patient via phone for nutrition assessment.  Noted patient met with SLP yesterday.  Planning MBSS.  Patient reports that yesterday he ate bologna and cheese sandwich but had to take the cheese off after a few bites and some applesauce with SLP.  Does not remember eating anything else yesterday.  Eats about 1 meal per day.  Has been eating things like hamburger helper, mashed potatoes, beef stew.  Does not have any teeth and dysphagia.  Patient reports that he drinks about 6 beers per day and some water and soda.  Patient does not like the taste of ensure/boost type shakes.     Medications: protonix  Labs: reviewed  Anthropometrics:   Ht:  67 inches Wt: 107 lb Noted 22 lb weight loss in the last 3 months.   BMI: 16  17% weight loss in the last 3 months, signficant  Estimated Energy Needs  Kcals: 1500-1700 Protein: 75-85 g Fluid: 1.5 L  NUTRITION DIAGNOSIS: Inadequate oral intake related epiglottic cancer as evidenced by 17% weight loss in the last 3 months and poor po intake   MALNUTRITION DIAGNOSIS: likely with significant weight loss and poor po intake   INTERVENTION:  Discussed easy to chew swallow foods high in calories and protein.  Encouraged hydration. Discussed feeding tube briefly.  Once feeding tube is placed will need osmolite 1.5, 5 cartons per day.  Flush with 4m of water before and after feeding.   Tube feeding at goal rate will provide 1775 calories, 74.5 g protein and 15029mfree water (including free water from formula).  This will meet 100% of calorie needs.    Contact information provided to patient      MONITORING, EVALUATION, GOAL: weight trends, intake, tube  feeding   NEXT VISIT: Friday, October 22 in clinic to review feeding tube  Liza Czerwinski B. AlZenia ResidesRDPilot GroveLDOologahegistered Dietitian [phone])

## 2020-08-12 NOTE — Progress Notes (Signed)
mbss orders placed per SLP

## 2020-08-16 ENCOUNTER — Inpatient Hospital Stay (HOSPITAL_COMMUNITY): Payer: Medicaid Other

## 2020-08-17 NOTE — Patient Instructions (Signed)
[name]  08/17/2020     @PREFPERIOPPHARMACY @   Your procedure is scheduled on  08/22/2020.  Report to [facility] at  [phone]  A.M.  Call this number if you have problems the morning of surgery:  [phone]   Remember:  Do not eat or drink after midnight.                         Take these medicines the morning of surgery with A SIP OF WATER  Amlodipine, tegretol, gabapentin, keppra, oxycodone(if needed), protonix. Use your inhalers and your nebulizer before you come. Bring your rescue inhaler with you.    Do not wear jewelry, make-up or nail polish.  Do not wear lotions, powders, or perfumes. Please wear deodorant and brush your teeth.  Do not shave 48 hours prior to surgery.  Men may shave face and neck.  Do not bring valuables to the hospital.  [facility] is not responsible for any belongings or valuables.  Contacts, dentures or bridgework may not be worn into surgery.  Leave your suitcase in the car.  After surgery it may be brought to your room.  For patients admitted to the hospital, discharge time will be determined by your treatment team.  Patients discharged the day of surgery will not be allowed to drive home.   Name and phone number of your driver:   family Special instructions:   DO NOT smoke the morning of your procdure.  Please read over the following fact sheets that you were given. Anesthesia Post-op Instructions and Care and Recovery After Surgery       Implanted Port Insertion, Care After This sheet gives you information about how to care for yourself after your procedure. Your health care provider may also give you more specific instructions. If you have problems or questions, contact your health care provider. What can I expect after the procedure? After the procedure, it is common to have:  Discomfort at the port insertion site.  Bruising on the skin over the port. This should improve over 3-4 days. Follow these  instructions at home: [facility] care  After your port is placed, you will get a manufacturer's information card. The card has information about your port. Keep this card with you at all times.  Take care of the port as told by your health care provider. Ask your health care provider if you or a family member can get training for taking care of the port at home. A home health care nurse may also take care of the port.  Make sure to remember what type of port you have. Incision care      Follow instructions from your health care provider about how to take care of your port insertion site. Make sure you: ? Wash your hands with soap and water before and after you change your bandage (dressing). If soap and water are not available, use hand sanitizer. ? Change your dressing as told by your health care provider. ? Leave stitches (sutures), skin glue, or adhesive strips in place. These skin closures may need to stay in place for 2 weeks or longer. If adhesive strip edges start to loosen and curl up, you may trim the loose edges. Do not remove adhesive strips completely unless your health care provider tells you to do that.  Check your port insertion site every day for signs of  infection. Check for: ? Redness, swelling, or pain. ? Fluid or blood. ? Warmth. ? Pus or a bad smell. Activity  Return to your normal activities as told by your health care provider. Ask your health care provider what activities are safe for you.  Do not lift anything that is heavier than 10 lb (4.5 kg), or the limit that you are told, until your health care provider says that it is safe. General instructions  Take over-the-counter and prescription medicines only as told by your health care provider.  Do not take baths, swim, or use a hot tub until your health care provider approves. Ask your health care provider if you may take showers. You may only be allowed to take sponge baths.  Do not drive for 24 hours if you were  given a sedative during your procedure.  Wear a medical alert bracelet in case of an emergency. This will tell any health care providers that you have a port.  Keep all follow-up visits as told by your health care provider. This is important. Contact a health care provider if:  You cannot flush your port with saline as directed, or you cannot draw blood from the port.  You have a fever or chills.  You have redness, swelling, or pain around your port insertion site.  You have fluid or blood coming from your port insertion site.  Your port insertion site feels warm to the touch.  You have pus or a bad smell coming from the port insertion site. Get help right away if:  You have chest pain or shortness of breath.  You have bleeding from your port that you cannot control. Summary  Take care of the port as told by your health care provider. Keep the manufacturer's information card with you at all times.  Change your dressing as told by your health care provider.  Contact a health care provider if you have a fever or chills or if you have redness, swelling, or pain around your port insertion site.  Keep all follow-up visits as told by your health care provider. This information is not intended to replace advice given to you by your health care provider. Make sure you discuss any questions you have with your health care provider. Document Revised: 05/13/2018 Document Reviewed: 05/13/2018 Elsevier Patient Education  [place] An implanted port is a device that is placed under the skin. It is usually placed in the chest. The device can be used to give IV medicine, to take blood, or for dialysis. You may have an implanted port if:  You need IV medicine that would be irritating to the small veins in your hands or arms.  You need IV medicines, such as antibiotics, for a long period of time.  You need IV nutrition for a long period of time.  You  need dialysis. Having a port means that your health care provider will not need to use the veins in your arms for these procedures. You may have fewer limitations when using a port than you would if you used other types of long-term IVs, and you will likely be able to return to normal activities after your incision heals. An implanted port has two main parts:  Reservoir. The reservoir is the part where a needle is inserted to give medicines or draw blood. The reservoir is round. After it is placed, it appears as a small, raised area under your skin.  Catheter. The catheter is  a thin, flexible tube that connects the reservoir to a vein. Medicine that is inserted into the reservoir goes into the catheter and then into the vein. How is my port accessed? To access your port:  A numbing cream may be placed on the skin over the port site.  Your health care provider will put on a mask and sterile gloves.  The skin over your port will be cleaned carefully with a germ-killing soap and allowed to dry.  Your health care provider will gently pinch the port and insert a needle into it.  Your health care provider will check for a blood return to make sure the port is in the vein and is not clogged.  If your port needs to remain accessed to get medicine continuously (constant infusion), your health care provider will place a clear bandage (dressing) over the needle site. The dressing and needle will need to be changed every week, or as told by your health care provider. What is flushing? Flushing helps keep the port from getting clogged. Follow instructions from your health care provider about how and when to flush the port. Ports are usually flushed with saline solution or a medicine called heparin. The need for flushing will depend on how the port is used:  If the port is only used from time to time to give medicines or draw blood, the port may need to be flushed: ? Before and after medicines have been  given. ? Before and after blood has been drawn. ? As part of routine maintenance. Flushing may be recommended every 4-6 weeks.  If a constant infusion is running, the port may not need to be flushed.  Throw away any syringes in a disposal container that is meant for sharp items (sharps container). You can buy a sharps container from a pharmacy, or you can make one by using an empty hard plastic bottle with a cover. How long will my port stay implanted? The port can stay in for as long as your health care provider thinks it is needed. When it is time for the port to come out, a surgery will be done to remove it. The surgery will be similar to the procedure that was done to put the port in. Follow these instructions at home:   Flush your port as told by your health care provider.  If you need an infusion over several days, follow instructions from your health care provider about how to take care of your port site. Make sure you: ? Wash your hands with soap and water before you change your dressing. If soap and water are not available, use alcohol-based hand sanitizer. ? Change your dressing as told by your health care provider. ? Place any used dressings or infusion bags into a plastic bag. Throw that bag in the trash. ? Keep the dressing that covers the needle clean and dry. Do not get it wet. ? Do not use scissors or sharp objects near the tube. ? Keep the tube clamped, unless it is being used.  Check your port site every day for signs of infection. Check for: ? Redness, swelling, or pain. ? Fluid or blood. ? Pus or a bad smell.  Protect the skin around the port site. ? Avoid wearing bra straps that rub or irritate the site. ? Protect the skin around your port from seat belts. Place a soft pad over your chest if needed.  Bathe or shower as told by your health care provider. The site  may get wet as long as you are not actively receiving an infusion.  Return to your normal activities  as told by your health care provider. Ask your health care provider what activities are safe for you.  Carry a medical alert card or wear a medical alert bracelet at all times. This will let health care providers know that you have an implanted port in case of an emergency. Get help right away if:  You have redness, swelling, or pain at the port site.  You have fluid or blood coming from your port site.  You have pus or a bad smell coming from the port site.  You have a fever. Summary  Implanted ports are usually placed in the chest for long-term IV access.  Follow instructions from your health care provider about flushing the port and changing bandages (dressings).  Take care of the area around your port by avoiding clothing that puts pressure on the area, and by watching for signs of infection.  Protect the skin around your port from seat belts. Place a soft pad over your chest if needed.  Get help right away if you have a fever or you have redness, swelling, pain, drainage, or a bad smell at the port site. This information is not intended to replace advice given to you by your health care provider. Make sure you discuss any questions you have with your health care provider. Document Revised: 02/06/2019 Document Reviewed: 11/17/2016 Elsevier Patient Education  [place].  PEG Tube Home Guide A PEG tube is used to put food and fluids into the stomach. Before you leave the hospital, make sure that you know:  How to care for your PEG tube.  How to care for the opening (stoma) in your belly.  How to give yourself a feeding.  How to give yourself medicines.  When to call your doctor for help. Supplies needed:  Soapy water.  Clean, plain water.  Clean washcloth.  Bandage (dressing). This is optional.  Syringe. How to care for a PEG tube Check your PEG tube every day. Make sure:  It is not too tight.  It is in the correct place. There is a mark on the tube  that shows when the tube is in the correct place. Adjust the tube if you need to. Cleaning your stoma Clean your stoma every day. Follow these steps: 1. Wash your hands with soap and water. If soap and water are not available, use hand sanitizer. 2. Check your stoma. Let your doctor know if there is: ? Redness. ? Leaking. ? Skin irritation. 3. Wash the stoma gently with warm, soapy water. 4. Rinse the stoma with plain water. 5. Pat the stoma area dry. 6. You may place a bandage around the opening of your stoma as told by your doctor.  Giving a feeding Your doctor will tell you:  How much nutrition and fluid you will need for each feeding.  How often to have a feeding.  Whether you should take medicine in the tube by itself or with a feeding. To give yourself a feeding, follow these steps: 1. Lay out all of the things that you will need. 2. Make sure that the nutritional formula is at room temperature. 3. Wash your hands with soap and water. 4. Sit up or stand up straight. You will need to stay sitting up or standing up while you give yourself a feeding. 5. Make sure the syringe plunger is pushed in. Place the tip  of the syringe in clean water, and slowly pull the plunger to bring (draw up) the water into the syringe. 6. Remove the clamp and the cap from the PEG tube. 7. Push the water out of the syringe to clean (flush) the tube. 8. If the tube is clear, draw up the formula into the syringe. Make sure to use the right amount for each feeding. Add water if you need to. 9. Slowly push the formula from the syringe through the tube. 10. After the feeding, flush the tube with water. 11. Put the clamp and the cap on the tube. 12. Stay sitting up or standing up straight for at least 30 minutes. Giving medicine To give yourself medicine, follow these steps: 1. Lay out all of the things that you will need. 2. If your medicine is in tablet form, crush it and dissolve it in water. 3. Wash  your hands with soap and water. 4. Sit up or stand up straight. You will need to stay sitting up or standing up while you give yourself medicine. 5. Make sure the syringe plunger is pushed in. Place the tip of the syringe in clean water, and slowly pull the plunger to bring (draw up) the water into the syringe. 6. Remove the clamp and the cap from the PEG tube. 7. Push the water out of the syringe to clean (flush) the tube. 8. If the tube is clear, draw up the medicine into the syringe. 9. Slowly push the medicine from the syringe through the tube. 10. Flush the tube with water. 11. Put the clamp and the cap on the tube. 12. Stay sitting up or standing up straight for at least 30 minutes. Do not take sustained release (SR) medicines through your tube. If you are not sure if your medicine is an SR medicine, ask your doctor. Contact a doctor if:  The area around your stoma is sore, irritated, or red.  You have belly pain or bloating while you are feeding or after you feed.  You feel sick to your stomach (nauseous) for a long time.  You have trouble pooping (constipation) or you have watery poop (diarrhea) for a long time.  You have a fever.  You have problems with your PEG tube. Get help right away if:  Your tube is blocked.  Your tube falls out.  You have pain around your tube.  You are bleeding from your tube.  Your tube is leaking.  You choke or you have trouble breathing while you are feeding or after you feed. Summary  A PEG tube is used to put food and fluids into the stomach.  Before you leave the hospital, you will be taught how to use and care for your PEG tube.  Your doctor will give you instructions on how to give yourself feedings and medicines.  Contact your doctor if you have fever or soreness, redness, or irritation around your stoma.  Get help right away if your tube leaks, is blocked, or falls out. Also, get help right away if you have pain or bleeding  around your stoma. This information is not intended to replace advice given to you by your health care provider. Make sure you discuss any questions you have with your health care provider. Document Revised: 01/01/2019 Document Reviewed: 10/28/2017 Elsevier Patient Education  2020 [place].  Upper Endoscopy, Adult, Care After This sheet gives you information about how to care for yourself after your procedure. Your health care provider may also give  you more specific instructions. If you have problems or questions, contact your health care provider. What can I expect after the procedure? After the procedure, it is common to have:  A sore throat.  Mild stomach pain or discomfort.  Bloating.  Nausea. Follow these instructions at home:   Follow instructions from your health care provider about what to eat or drink after your procedure.  Return to your normal activities as told by your health care provider. Ask your health care provider what activities are safe for you.  Take over-the-counter and prescription medicines only as told by your health care provider.  Do not drive for 24 hours if you were given a sedative during your procedure.  Keep all follow-up visits as told by your health care provider. This is important. Contact a health care provider if you have:  A sore throat that lasts longer than one day.  Trouble swallowing. Get help right away if:  You vomit blood or your vomit looks like coffee grounds.  You have: ? A fever. ? Bloody, black, or tarry stools. ? A severe sore throat or you cannot swallow. ? Difficulty breathing. ? Severe pain in your chest or abdomen. Summary  After the procedure, it is common to have a sore throat, mild stomach discomfort, bloating, and nausea.  Do not drive for 24 hours if you were given a sedative during the procedure.  Follow instructions from your health care provider about what to eat or drink after your  procedure.  Return to your normal activities as told by your health care provider. This information is not intended to replace advice given to you by your health care provider. Make sure you discuss any questions you have with your health care provider. Document Revised: 04/08/2018 Document Reviewed: 03/17/2018 Elsevier Patient Education  2020 [place] Anesthesia, Adult, Care After This sheet gives you information about how to care for yourself after your procedure. Your health care provider may also give you more specific instructions. If you have problems or questions, contact your health care provider. What can I expect after the procedure? After the procedure, the following side effects are common:  Pain or discomfort at the IV site.  Nausea.  Vomiting.  Sore throat.  Trouble concentrating.  Feeling cold or chills.  Weak or tired.  Sleepiness and fatigue.  Soreness and body aches. These side effects can affect parts of the body that were not involved in surgery. Follow these instructions at home:  For at least 24 hours after the procedure:  Have a responsible adult stay with you. It is important to have someone help care for you until you are awake and alert.  Rest as needed.  Do not: ? Participate in activities in which you could fall or become injured. ? Drive. ? Use heavy machinery. ? Drink alcohol. ? Take sleeping pills or medicines that cause drowsiness. ? Make important decisions or sign legal documents. ? Take care of children on your own. Eating and drinking  Follow any instructions from your health care provider about eating or drinking restrictions.  When you feel hungry, start by eating small amounts of foods that are soft and easy to digest (bland), such as toast. Gradually return to your regular diet.  Drink enough fluid to keep your urine pale yellow.  If you vomit, rehydrate by drinking water, juice, or clear broth. General  instructions  If you have sleep apnea, surgery and certain medicines can increase your risk for breathing  problems. Follow instructions from your health care provider about wearing your sleep device: ? Anytime you are sleeping, including during daytime naps. ? While taking prescription pain medicines, sleeping medicines, or medicines that make you drowsy.  Return to your normal activities as told by your health care provider. Ask your health care provider what activities are safe for you.  Take over-the-counter and prescription medicines only as told by your health care provider.  If you smoke, do not smoke without supervision.  Keep all follow-up visits as told by your health care provider. This is important. Contact a health care provider if:  You have nausea or vomiting that does not get better with medicine.  You cannot eat or drink without vomiting.  You have pain that does not get better with medicine.  You are unable to pass urine.  You develop a skin rash.  You have a fever.  You have redness around your IV site that gets worse. Get help right away if:  You have difficulty breathing.  You have chest pain.  You have blood in your urine or stool, or you vomit blood. Summary  After the procedure, it is common to have a sore throat or nausea. It is also common to feel tired.  Have a responsible adult stay with you for the first 24 hours after general anesthesia. It is important to have someone help care for you until you are awake and alert.  When you feel hungry, start by eating small amounts of foods that are soft and easy to digest (bland), such as toast. Gradually return to your regular diet.  Drink enough fluid to keep your urine pale yellow.  Return to your normal activities as told by your health care provider. Ask your health care provider what activities are safe for you. This information is not intended to replace advice given to you by your health care  provider. Make sure you discuss any questions you have with your health care provider. Document Revised: 10/18/2017 Document Reviewed: 05/31/2017 Elsevier Patient Education  [place]. How to Use Chlorhexidine for Bathing Chlorhexidine gluconate (CHG) is a germ-killing (antiseptic) solution that is used to clean the skin. It can get rid of the bacteria that normally live on the skin and can keep them away for about 24 hours. To clean your skin with CHG, you may be given:  A CHG solution to use in the shower or as part of a sponge bath.  A prepackaged cloth that contains CHG. Cleaning your skin with CHG may help lower the risk for infection:  While you are staying in the intensive care unit of the hospital.  If you have a vascular access, such as a central line, to provide short-term or long-term access to your veins.  If you have a catheter to drain urine from your bladder.  If you are on a ventilator. A ventilator is a machine that helps you breathe by moving air in and out of your lungs.  After surgery. What are the risks? Risks of using CHG include:  A skin reaction.  Hearing loss, if CHG gets in your ears.  Eye injury, if CHG gets in your eyes and is not rinsed out.  The CHG product catching fire. Make sure that you avoid smoking and flames after applying CHG to your skin. Do not use CHG:  If you have a chlorhexidine allergy or have previously reacted to chlorhexidine.  On babies younger than 67 months of age. How to use  CHG solution  Use CHG only as told by your health care provider, and follow the instructions on the label.  Use the full amount of CHG as directed. Usually, this is one bottle. During a shower Follow these steps when using CHG solution during a shower (unless your health care provider gives you different instructions): 1. Start the shower. 2. Use your normal soap and shampoo to wash your face and hair. 3. Turn off the shower or move out of the  shower stream. 4. Pour the CHG onto a clean washcloth. Do not use any type of brush or rough-edged sponge. 5. Starting at your neck, lather your body down to your toes. Make sure you follow these instructions: ? If you will be having surgery, pay special attention to the part of your body where you will be having surgery. Scrub this area for at least 1 minute. ? Do not use CHG on your head or face. If the solution gets into your ears or eyes, rinse them well with water. ? Avoid your genital area. ? Avoid any areas of skin that have broken skin, cuts, or scrapes. ? Scrub your back and under your arms. Make sure to wash skin folds. 6. Let the lather sit on your skin for 1-2 minutes or as long as told by your health care provider. 7. Thoroughly rinse your entire body in the shower. Make sure that all body creases and crevices are rinsed well. 8. Dry off with a clean towel. Do not put any substances on your body afterward--such as powder, lotion, or perfume--unless you are told to do so by your health care provider. Only use lotions that are recommended by the manufacturer. 9. Put on clean clothes or pajamas. 10. If it is the night before your surgery, sleep in clean sheets.  During a sponge bath Follow these steps when using CHG solution during a sponge bath (unless your health care provider gives you different instructions): 1. Use your normal soap and shampoo to wash your face and hair. 2. Pour the CHG onto a clean washcloth. 3. Starting at your neck, lather your body down to your toes. Make sure you follow these instructions: ? If you will be having surgery, pay special attention to the part of your body where you will be having surgery. Scrub this area for at least 1 minute. ? Do not use CHG on your head or face. If the solution gets into your ears or eyes, rinse them well with water. ? Avoid your genital area. ? Avoid any areas of skin that have broken skin, cuts, or scrapes. ? Scrub your  back and under your arms. Make sure to wash skin folds. 4. Let the lather sit on your skin for 1-2 minutes or as long as told by your health care provider. 5. Using a different clean, wet washcloth, thoroughly rinse your entire body. Make sure that all body creases and crevices are rinsed well. 6. Dry off with a clean towel. Do not put any substances on your body afterward--such as powder, lotion, or perfume--unless you are told to do so by your health care provider. Only use lotions that are recommended by the manufacturer. 7. Put on clean clothes or pajamas. 8. If it is the night before your surgery, sleep in clean sheets. How to use CHG prepackaged cloths  Only use CHG cloths as told by your health care provider, and follow the instructions on the label.  Use the CHG cloth on clean, dry skin.  Do not use the CHG cloth on your head or face unless your health care provider tells you to.  When washing with the CHG cloth: ? Avoid your genital area. ? Avoid any areas of skin that have broken skin, cuts, or scrapes. Before surgery Follow these steps when using a CHG cloth to clean before surgery (unless your health care provider gives you different instructions): 1. Using the CHG cloth, vigorously scrub the part of your body where you will be having surgery. Scrub using a back-and-forth motion for 3 minutes. The area on your body should be completely wet with CHG when you are done scrubbing. 2. Do not rinse. Discard the cloth and let the area air-dry. Do not put any substances on the area afterward, such as powder, lotion, or perfume. 3. Put on clean clothes or pajamas. 4. If it is the night before your surgery, sleep in clean sheets.  For general bathing Follow these steps when using CHG cloths for general bathing (unless your health care provider gives you different instructions). 1. Use a separate CHG cloth for each area of your body. Make sure you wash between any folds of skin and between  your fingers and toes. Wash your body in the following order, switching to a new cloth after each step: ? The front of your neck, shoulders, and chest. ? Both of your arms, under your arms, and your hands. ? Your stomach and groin area, avoiding the genitals. ? Your right leg and foot. ? Your left leg and foot. ? The back of your neck, your back, and your buttocks. 2. Do not rinse. Discard the cloth and let the area air-dry. Do not put any substances on your body afterward--such as powder, lotion, or perfume--unless you are told to do so by your health care provider. Only use lotions that are recommended by the manufacturer. 3. Put on clean clothes or pajamas. Contact a health care provider if:  Your skin gets irritated after scrubbing.  You have questions about using your solution or cloth. Get help right away if:  Your eyes become very red or swollen.  Your eyes itch badly.  Your skin itches badly and is red or swollen.  Your hearing changes.  You have trouble seeing.  You have swelling or tingling in your mouth or throat.  You have trouble breathing.  You swallow any chlorhexidine. Summary  Chlorhexidine gluconate (CHG) is a germ-killing (antiseptic) solution that is used to clean the skin. Cleaning your skin with CHG may help to lower your risk for infection.  You may be given CHG to use for bathing. It may be in a bottle or in a prepackaged cloth to use on your skin. Carefully follow your health care provider's instructions and the instructions on the product label.  Do not use CHG if you have a chlorhexidine allergy.  Contact your health care provider if your skin gets irritated after scrubbing. This information is not intended to replace advice given to you by your health care provider. Make sure you discuss any questions you have with your health care provider. Document Revised: 01/01/2019 Document Reviewed: 09/12/2017 [employer] Patient Education  [employer].

## 2020-08-18 ENCOUNTER — Encounter (HOSPITAL_COMMUNITY)
Admission: RE | Admit: 2020-08-18 | Discharge: 2020-08-18 | Disposition: A | Discharge status: Home / Self Care | Payer: Medicaid Other | Source: Ambulatory Visit | Attending: General Surgery | Admitting: General Surgery

## 2020-08-18 ENCOUNTER — Other Ambulatory Visit (HOSPITAL_COMMUNITY): Payer: Self-pay | Admitting: Specialist

## 2020-08-18 ENCOUNTER — Other Ambulatory Visit: Payer: Self-pay

## 2020-08-18 DIAGNOSIS — R1319 Other dysphagia: Secondary | ICD-10-CM

## 2020-08-18 DIAGNOSIS — C321 Malignant neoplasm of supraglottis: Secondary | ICD-10-CM

## 2020-08-19 ENCOUNTER — Encounter (HOSPITAL_COMMUNITY): Payer: Medicaid Other

## 2020-08-19 ENCOUNTER — Other Ambulatory Visit (HOSPITAL_COMMUNITY)
Admission: RE | Admit: 2020-08-19 | Discharge: 2020-08-19 | Disposition: A | Discharge status: Home / Self Care | Payer: Medicaid Other | Source: Ambulatory Visit | Attending: General Surgery | Admitting: General Surgery

## 2020-08-19 ENCOUNTER — Inpatient Hospital Stay (HOSPITAL_COMMUNITY): Payer: Medicaid Other

## 2020-08-19 DIAGNOSIS — Z20822 Contact with and (suspected) exposure to covid-19: Secondary | ICD-10-CM | POA: Diagnosis not present

## 2020-08-19 DIAGNOSIS — Z01812 Encounter for preprocedural laboratory examination: Secondary | ICD-10-CM | POA: Diagnosis not present

## 2020-08-19 NOTE — Progress Notes (Addendum)
Nutrition Follow-up:  Patient with squamous cell carcinoma of epiglottis.  Planning concurrent chemotherapy and radiation therapy.    Patient called to cancel chemo education appointment this am and unable to see RD for feeding tube instruction as no transportation.    RD called patient to discuss setting up DME to deliver tube feeding supplies, formula and provide education on feeding tube after tube is placed.    Port and PEG placement planned for Monday, 10/25  Medications: reviewed  Labs: reviewed  Anthropometrics:   107 lb on 10/12  Estimated Energy Needs  Kcals: 1500-1700 Protein: 75-85 g Fluid: 1.5 L  NUTRITION DIAGNOSIS: Inadequate oral intake continues   INTERVENTION:  Patient agreeable to [facility], DME providing tube feeding supplies, formula and education.  Instructed patient that they would be calling him to talk with him and set up time for delivery of supplies and teaching after feeding tube is placed.  Patient verbalized understanding.  Recommend patient start at 1/2 carton of osmolite 1.5, 5 times per day. Flush with 94ml of water before and after each feeding.  Increase by 1/2 carton daily until reaches 5 full cartons per day.  Patient will need to drink or provide via tube additional 215ml of water daily.   Recommend monitoring of refeeding labs (CMP, Mag, phosphorus) at least 2 times weekly and supplementing if low.  Recommend MVI daily and thiamine 100mg  daily for at least 10 days. Message sent to provider regarding above Patient has contact information    MONITORING, EVALUATION, GOAL: weight trends, intake, tube feeding tolerance   NEXT VISIT: October 29 phone f/u Says that he will not have transportation to cancer center on 10/29 for nutrition appointment  Reyonna Haack B. Zenia Resides, [place], [place] Registered Dietitian [phone] (mobile)

## 2020-08-20 LAB — SARS CORONAVIRUS 2 (TAT 6-24 HRS): SARS Coronavirus 2: NEGATIVE

## 2020-08-21 NOTE — Patient Instructions (Signed)
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  You will be treated in our clinic weekly with chemotherapy in conjunction with radiation therapy - you will do radiation therapy 5 days a week (Monday-Friday) and come to the clinic one day a week to receive chemotherapy.  We will also have you come to the clinic the two days following your chemotherapy to receive IV fluids (this is done to help protect your kidneys).  The chemotherapy drug you will be receiving is called cisplatin.  The intent of treatment is to cure your disease. You will see the doctor regularly throughout treatment.  We will obtain blood work from you prior to every treatment and monitor your results to make sure it is safe to give your treatment. The doctor monitors your response to treatment by the way you are feeling, your blood work, and by obtaining scans periodically.  There will be wait times while you are here for treatment.  It will take about 30 minutes to 1 hour for your lab work to result.  Then there will be wait times while pharmacy mixes your medications.     Medications you will receive in the clinic prior to your chemotherapy medications:  Aloxi:  ALOXI is used in adults to help prevent nausea and vomiting that happens with certain chemotherapy drugs.  Aloxi is a long acting medication, and will remain in your system for about two days.   Emend:  This is an anti-nausea medication that is used with Aloxi to help prevent nausea and vomiting caused by chemotherapy.  Dexamethasone:  This is a steroid given prior to chemotherapy to help prevent allergic reactions; it may also help prevent and control nausea and diarrhea.    CISPLATIN  About This Drug Cisplatin is a drug used to treat cancer. This drug is given in the vein (IV).  This will take 1 hour to infuse.  With this drug you will receive 2 hours of IV hydration prior to  administration and 1 hour of IV hydration after administration.  This is to help protect your kidneys.  You will have to urinate 200 mL prior to receiving this medication.  We will give you something to measure your urine in.   Possible Side Effects (More Common) . This drug may affect how your kidneys work. Your kidney function will be checked as needed.  . Electrolyte changes. Your blood will be checked for electrolyte changes as needed.  . High-frequency hearing loss may occur. You will get IV fluids before and during the Cisplatin infusion to help prevent this. You may also get ringing in the ears.  . Bone marrow depression. This is a decrease in the number of white blood cells, red blood cells, and platelets. This may raise your risk of infection, make you tired and weak (fatigue), and raise your risk of bleeding.  . Nausea and throwing up (vomiting). These symptoms may happen within a few hours after your treatment and may last for a few days to a week. Medicines are available to stop or lessen these side effects.  Possible Side Effects (Less Common) . Effects on the nerves are called peripheral neuropathy. You may feel numbness or pain in your hands and feet. It may be hard for you to button your clothes, open jars, or walk as usual. The effect on the nerves may get worse with more doses of the drug. These effects get better in some  people after the drug is stopped, but it does not get better in all people.  Marland Kitchen Blurred vision or other changes in eyesight. . Soreness of the mouth and throat. You may have red areas, white patches, or sores that hurt.  . Hair loss. You may notice your hair getting thin. Some patients lose their hair. Your hair often grows back when treatment is done.  Allergic Reactions Allergic reactions to this drug are rare, but may happen in some patients. Signs of allergic reactions to this drug may be a rash, fever, chills, feeling dizzy, trouble breathing, and/or  feeling that your heart is beating in a fast or not normal way.  Treating Side Effects . Drink 6-8 cups of fluids each day unless your doctor has told you to limit your fluid intake due to some other health problem. A cup is 8 ounces of fluid. If you throw up or have loose bowel movements you should drink more fluids so that you do not become dehydrated (lack water in the body due to losing too much fluid).  . If you have numbness and tingling in your hands and feet, be careful when cooking, walking, and handling sharp objects and hot liquids.  . Mouth care is very important. Your mouth care should consist of routine, gentle cleaning of your teeth or dentures and rinsing your mouth with a mixture of 1/2 teaspoon of salt in 8 ounces of water or  teaspoon of baking soda in 8 ounces of water. This should be done at least after each meal and at bedtime.  . If you have mouth sores, avoid mouthwash that has alcohol. Also avoid alcohol and smoking because they can bother your mouth and throat.  . Talk with your nurse about getting a wig before you lose your hair. Also, call the [facility] at [phone]-ACS-2345 to find out information about the "Look Good, Feel Better" program close to where you live. It is a free program where women getting chemotherapy can learn about wigs, turbans and scarves as well as makeup techniques and skin and nail care.  Food and Drug Interactions  There are no known interactions of Cisplatin with food. This drug may interact with other medicines. Tell your doctor and pharmacist about all the medicines and dietary supplements (vitamins, minerals, herbs and others) that you are taking at this time. The safety and use of dietary supplements and alternative diets are often not known. Using these might affect your cancer or interfere with your treatment. Until more is known, you should not use dietary supplements or alternative diets without your cancer doctor's help.  When  to Call the Doctor  Call your doctor or nurse right away if you have any of these symptoms: . Rash or itching  . Feeling dizzy or lightheaded  . Wheezing or trouble breathing  . Swelling of the face  . Fever of 100.4 F (38 C) or above  . Chills  . Easy bleeding or bruising  . Decreased urine  . Weight gain of 5 pounds in one week (fluid retention)  . Nausea that stops you from eating or drinking  . Throwing up  Call your doctor or nurse as soon as possible if you have these symptoms: . Numbness, tingling, decreased feeling or weakness in fingers, toes, arms, or legs  . Trouble walking or changes in the way you walk, feeling clumsy when buttoning clothes, opening jars, or other routine hand motions  . Blurred vision or other changes in  eyesight  . Changes in hearing, ringing in the ears  . Pain in your mouth or throat that makes it hard to eat or drink  . Fatigue that interferes with your daily activities  Sexual Problems and Reproductive Concerns  . Infertility warning: Sexual problems and reproduction concerns may occur. In both men and women, this drug may affect your ability to have children. This cannot be determined before your treatment. Speak with your doctor or nurse if you plan to have children. Ask for information on sperm or egg banking.  . In men, this drug may interfere with your ability to make sperm, but it should not change your ability to have sexual relations.  . In women, menstrual bleeding may become irregular or stop while you are receiving this drug. Do not assume that you cannot become pregnant if you do not have a menstrual period.  . Women may experience signs of menopause like vaginal dryness, itching, and pain during sexual relations  . Genetic counseling is available for you to talk about the effects of this drug therapy on future pregnancies. Also, a genetic counselor can look at the possible risk of problems in the unborn baby due to this  medicine if an exposure happens during pregnancy.   SELF CARE ACTIVITIES WHILE RECEIVING CHEMOTHERAPY:  Hydration Increase your fluid intake 48 hours prior to treatment and drink at least 8 to 12 cups (64 ounces) of water/decaffeinated beverages per day after treatment. You can still have your cup of coffee or soda but these beverages do not count as part of your 8 to 12 cups that you need to drink daily. No alcohol intake.  Medications Continue taking your normal prescription medication as prescribed.  If you start any new herbal or new supplements please let us know first to make sure it is safe.  Mouth Care Have teeth cleaned professionally before starting treatment. Keep dentures and partial plates clean. Use soft toothbrush and do not use mouthwashes that contain alcohol. Biotene is a good mouthwash that is available at most pharmacies or may be ordered by calling [phone]. Use warm salt water gargles (1 teaspoon salt per 1 quart warm water) before and after meals and at bedtime. If you need dental work, please let the doctor know before you go for your appointment so that we can coordinate the best possible time for you in regards to your chemo regimen. You need to also let your dentist know that you are actively taking chemo. We may need to do labs prior to your dental appointment.  Skin Care Always use sunscreen that has not expired and with SPF (Sun Protection Factor) of 50 or higher. Wear hats to protect your head from the sun. Remember to use sunscreen on your hands, ears, face, & feet.  Use good moisturizing lotions such as udder cream, eucerin, or even Vaseline. Some chemotherapies can cause dry skin, color changes in your skin and nails.    . Avoid long, hot showers or baths. . Use gentle, fragrance-free soaps and laundry detergent. . Use moisturizers, preferably creams or ointments rather than lotions because the thicker consistency is better at preventing skin dehydration.  Apply the cream or ointment within 15 minutes of showering. Reapply moisturizer at night, and moisturize your hands every time after you wash them.  Hair Loss (if your doctor says your hair will fall out)  . If your doctor says that your hair is likely to fall out, decide before you begin chemo whether  you want to wear a wig. You may want to shop before treatment to match your hair color. . Hats, turbans, and scarves can also camouflage hair loss, although some people prefer to leave their heads uncovered. If you go bare-headed outdoors, be sure to use sunscreen on your scalp. . Cut your hair short. It eases the inconvenience of shedding lots of hair, but it also can reduce the emotional impact of watching your hair fall out. . Don't perm or color your hair during chemotherapy. Those chemical treatments are already damaging to hair and can enhance hair loss. Once your chemo treatments are done and your hair has grown back, it's OK to resume dyeing or perming hair.  With chemotherapy, hair loss is almost always temporary. But when it grows back, it may be a different color or texture. In older adults who still had hair color before chemotherapy, the new growth may be completely gray.  Often, new hair is very fine and soft.  Infection Prevention Please wash your hands for at least 30 seconds using warm soapy water. Handwashing is the #1 way to prevent the spread of germs. Stay away from sick people or people who are getting over a cold. If you develop respiratory systems such as green/yellow mucus production or productive cough or persistent cough let us know and we will see if you need an antibiotic. It is a good idea to keep a pair of gloves on when going into grocery stores/[employer] to decrease your risk of coming into contact with germs on the carts, etc. Carry alcohol hand gel with you at all times and use it frequently if out in public. If your temperature reaches 100.5 or higher please call the  clinic and let us know.  If it is after hours or on the weekend please go to the ER if your temperature is over 100.5.  Please have your own personal thermometer at home to use.    Sex and bodily fluids If you are going to have sex, a condom must be used to protect the person that isn't taking chemotherapy. Chemo can decrease your libido (sex drive). For a few days after chemotherapy, chemotherapy can be excreted through your bodily fluids.  When using the toilet please close the lid and flush the toilet twice.  Do this for a few day after you have had chemotherapy.   Effects of chemotherapy on your sex life Some changes are simple and won't last long. They won't affect your sex life permanently.  Sometimes you may feel: . too tired . not strong enough to be very active . sick or sore  . not in the mood . anxious or low Your anxiety might not seem related to sex. For example, you may be worried about the cancer and how your treatment is going. Or you may be worried about money, or about how you family are coping with your illness.  These things can cause stress, which can affect your interest in sex. It's important to talk to your partner about how you feel.  Remember - the changes to your sex life don't usually last long. There's usually no medical reason to stop having sex during chemo. The drugs won't have any long term physical effects on your performance or enjoyment of sex. Cancer can't be passed on to your partner during sex  Contraception It's important to use reliable contraception during treatment. Avoid getting pregnant while you or your partner are having chemotherapy. This is because the drugs  may harm the baby. Sometimes chemotherapy drugs can leave a man or woman infertile.  This means you would not be able to have children in the future. You might want to talk to someone about permanent infertility. It can be very difficult to learn that you may no longer be able to have children.  Some people find counselling helpful. There might be ways to preserve your fertility, although this is easier for men than for women. You may want to speak to a fertility expert. You can talk about sperm banking or harvesting your eggs. You can also ask about other fertility options, such as donor eggs. If you have or have had breast cancer, your doctor might advise you not to take the contraceptive pill. This is because the hormones in it might affect the cancer. It is not known for sure whether or not chemotherapy drugs can be passed on through semen or secretions from the vagina. Because of this some doctors advise people to use a barrier method if you have sex during treatment. This applies to vaginal, anal or oral sex. Generally, doctors advise a barrier method only for the time you are actually having the treatment and for about a week after your treatment. Advice like this can be worrying, but this does not mean that you have to avoid being intimate with your partner. You can still have close contact with your partner and continue to enjoy sex.  Animals If you have cats or birds we just ask that you not change the litter or change the cage.  Please have someone else do this for you while you are on chemotherapy.   Food Safety During and After Cancer Treatment Food safety is important for people both during and after cancer treatment. Cancer and cancer treatments, such as chemotherapy, radiation therapy, and stem cell/bone marrow transplantation, often weaken the immune system. This makes it harder for your body to protect itself from foodborne illness, also called food poisoning. Foodborne illness is caused by eating food that contains harmful bacteria, parasites, or viruses.  Foods to avoid Some foods have a higher risk of becoming tainted with bacteria. These include: Marland Kitchen Unwashed fresh fruit and vegetables, especially leafy vegetables that can hide dirt and other contaminants . Raw sprouts, such  as alfalfa sprouts . Raw or undercooked beef, especially ground beef, or other raw or undercooked meat and poultry . Fatty, fried, or spicy foods immediately before or after treatment.  These can sit heavy on your stomach and make you feel nauseous. . Raw or undercooked shellfish, such as oysters. . Sushi and sashimi, which often contain raw fish.  . Unpasteurized beverages, such as unpasteurized fruit juices, raw milk, raw yogurt, or cider . Undercooked eggs, such as soft boiled, over easy, and poached; raw, unpasteurized eggs; or foods made with raw egg, such as homemade raw cookie dough and homemade mayonnaise  Simple steps for food safety  Shop smart. . Do not buy food stored or displayed in an unclean area. . Do not buy bruised or damaged fruits or vegetables. . Do not buy cans that have cracks, dents, or bulges. . Pick up foods that can spoil at the end of your shopping trip and store them in a cooler on the way home.  Prepare and clean up foods carefully. . Rinse all fresh fruits and vegetables under running water, and dry them with a clean towel or paper towel. . Clean the top of cans before opening them. . After preparing food,  wash your hands for 20 seconds with hot water and soap. Pay special attention to areas between fingers and under nails. . Clean your utensils and dishes with hot water and soap. Marland Kitchen Disinfect your kitchen and cutting boards using 1 teaspoon of liquid, unscented bleach mixed into 1 quart of water.    Dispose of old food. . Eat canned and packaged food before its expiration date (the "use by" or "best before" date). . Consume refrigerated leftovers within 3 to 4 days. After that time, throw out the food. Even if the food does not smell or look spoiled, it still may be unsafe. Some bacteria, such as Listeria, can grow even on foods stored in the refrigerator if they are kept for too long.  Take precautions when eating out. . At restaurants, avoid buffets and  salad bars where food sits out for a long time and comes in contact with many people. Food can become contaminated when someone with a virus, often a norovirus, or another "bug" handles it. . Put any leftover food in a "to-go" container yourself, rather than having the server do it. And, refrigerate leftovers as soon as you get home. . Choose restaurants that are clean and that are willing to prepare your food as you order it cooked.   AT HOME MEDICATIONS:                                                                                                                                                                Compazine/Prochlorperazine 10mg  tablet. Take 1 tablet every 6 hours as needed for nausea/vomiting. (This can make you sleepy)   EMLA cream. Apply a quarter size amount to port site 1 hour prior to chemo. Do not rub in. Cover with plastic wrap.    Diarrhea Sheet   If you are having loose stools/diarrhea, please purchase Imodium and begin taking as outlined:  At the first sign of poorly formed or loose stools you should begin taking Imodium (loperamide) 2 mg capsules.  Take two tablets (4mg ) followed by one tablet (2mg ) every 2 hours - DO NOT EXCEED 8 tablets in 24 hours.  If it is bedtime and you are having loose stools, take 2 tablets at bedtime, then 2 tablets every 4 hours until morning.   Always call the [facility] if you are having loose stools/diarrhea that you can't get under control.  Loose stools/diarrhea leads to dehydration (loss of water) in your body.  We have other options of trying to get the loose stools/diarrhea to stop but you must let us know!   Constipation Sheet  Colace - 100 mg capsules - take 2 capsules daily.  If this doesn't help then you can increase to 2 capsules twice daily.  Please call if the above  does not work for you. Do not go more than 2 days without a bowel movement.  It is very important that you do not become constipated.  It will make you feel  sick to your stomach (nausea) and can cause abdominal pain and vomiting.  Nausea Sheet   Compazine/Prochlorperazine 10mg  tablet. Take 1 tablet every 6 hours as needed for nausea/vomiting (This can make you drowsy).  If you are having persistent nausea (nausea that does not stop) please call the [facility] and let us know the amount of nausea that you are experiencing.  If you begin to vomit, you need to call the [facility] and if it is the weekend and you have vomited more than one time and can't get it to stop-go to the Emergency Room.  Persistent nausea/vomiting can lead to dehydration (loss of fluid in your body) and will make you feel very weak and unwell. Ice chips, sips of clear liquids, foods that are at room temperature, crackers, and toast tend to be better tolerated.   SYMPTOMS TO REPORT AS SOON AS POSSIBLE AFTER TREATMENT:  FEVER GREATER THAN 100.4 F  CHILLS WITH OR WITHOUT FEVER  NAUSEA AND VOMITING THAT IS NOT CONTROLLED WITH YOUR NAUSEA MEDICATION  UNUSUAL SHORTNESS OF BREATH  UNUSUAL BRUISING OR BLEEDING  TENDERNESS IN MOUTH AND THROAT WITH OR WITHOUT   PRESENCE OF ULCERS  URINARY PROBLEMS  BOWEL PROBLEMS  UNUSUAL RASH      Wear comfortable clothing and clothing appropriate for easy access to any Portacath or PICC line. Let us know if there is anything that we can do to make your therapy better!    What to do if you need assistance after hours or on the weekends: CALL [phone].  HOLD on the line, do not hang up.  You will hear multiple messages but at the end you will be connected with a nurse triage line.  They will contact the doctor if necessary.  Most of the time they will be able to assist you.  Do not call the hospital operator.      I have been informed and understand all of the instructions given to me and have received a copy. I have been instructed to call the clinic [phone] or my family physician as soon as possible for continued  medical care, if indicated. I do not have any more questions at this time but understand that I may call the [facility] or the Patient Navigator at [phone] during office hours should I have questions or need assistance in obtaining follow-up care.

## 2020-08-22 ENCOUNTER — Ambulatory Visit (HOSPITAL_COMMUNITY): Payer: Medicaid Other

## 2020-08-22 ENCOUNTER — Ambulatory Visit (HOSPITAL_COMMUNITY): Payer: Medicaid Other | Admitting: Anesthesiology

## 2020-08-22 ENCOUNTER — Ambulatory Visit (HOSPITAL_COMMUNITY)
Admission: RE | Admit: 2020-08-22 | Discharge: 2020-08-22 | Disposition: A | Discharge status: Home / Self Care | Payer: Medicaid Other | Attending: General Surgery | Admitting: General Surgery

## 2020-08-22 ENCOUNTER — Other Ambulatory Visit (HOSPITAL_COMMUNITY): Payer: Self-pay

## 2020-08-22 ENCOUNTER — Encounter (HOSPITAL_COMMUNITY): Payer: Self-pay | Admitting: General Surgery

## 2020-08-22 ENCOUNTER — Encounter (HOSPITAL_COMMUNITY)
Admission: RE | Disposition: A | Discharge status: Home / Self Care | Payer: Self-pay | Source: Home / Self Care | Attending: General Surgery

## 2020-08-22 DIAGNOSIS — K219 Gastro-esophageal reflux disease without esophagitis: Secondary | ICD-10-CM | POA: Insufficient documentation

## 2020-08-22 DIAGNOSIS — F17210 Nicotine dependence, cigarettes, uncomplicated: Secondary | ICD-10-CM | POA: Insufficient documentation

## 2020-08-22 DIAGNOSIS — C321 Malignant neoplasm of supraglottis: Secondary | ICD-10-CM

## 2020-08-22 DIAGNOSIS — Z79899 Other long term (current) drug therapy: Secondary | ICD-10-CM | POA: Insufficient documentation

## 2020-08-22 DIAGNOSIS — Z95828 Presence of other vascular implants and grafts: Secondary | ICD-10-CM

## 2020-08-22 DIAGNOSIS — J449 Chronic obstructive pulmonary disease, unspecified: Secondary | ICD-10-CM | POA: Diagnosis not present

## 2020-08-22 DIAGNOSIS — R1310 Dysphagia, unspecified: Secondary | ICD-10-CM | POA: Diagnosis not present

## 2020-08-22 DIAGNOSIS — R569 Unspecified convulsions: Secondary | ICD-10-CM | POA: Diagnosis not present

## 2020-08-22 HISTORY — PX: PORTACATH PLACEMENT: SHX2246

## 2020-08-22 HISTORY — PX: PEG PLACEMENT: SHX5437

## 2020-08-22 HISTORY — PX: ESOPHAGOGASTRODUODENOSCOPY (EGD) WITH PROPOFOL: SHX5813

## 2020-08-22 LAB — BASIC METABOLIC PANEL
Anion gap: 10 (ref 5–15)
BUN: <5 mg/dL — ABNORMAL LOW (ref 6–20)
CO2: 27 mmol/L (ref 22–32)
Calcium: 9 mg/dL (ref 8.9–10.3)
Chloride: 91 mmol/L — ABNORMAL LOW (ref 98–111)
Creatinine, Ser: 0.3 mg/dL — ABNORMAL LOW (ref 0.61–1.24)
GFR, Estimated: >60 mL/min (ref >60)
Glucose, Bld: 115 mg/dL — ABNORMAL HIGH (ref 70–99)
Potassium: 3.5 mmol/L (ref 3.5–5.1)
Sodium: 128 mmol/L — ABNORMAL LOW (ref 135–145)

## 2020-08-22 SURGERY — INSERTION, TUNNELED CENTRAL VENOUS DEVICE, WITH PORT
Anesthesia: General

## 2020-08-22 MED ORDER — WATER FOR IRRIGATION, STERILE IR SOLN: Status: DC | PRN

## 2020-08-22 MED ORDER — PROPOFOL 10 MG/ML IV BOLUS
INTRAVENOUS | Status: AC
  Filled 2020-08-22: qty 20

## 2020-08-22 MED ORDER — MIDAZOLAM HCL 2 MG/2ML IJ SOLN
INTRAMUSCULAR | Status: AC
  Filled 2020-08-22: qty 2

## 2020-08-22 MED ORDER — CHLORHEXIDINE GLUCONATE CLOTH 2 % EX PADS: 6.0000 | MEDICATED_PAD | Freq: Once | CUTANEOUS | Status: DC

## 2020-08-22 MED ORDER — SODIUM CHLORIDE (PF) 0.9 % IJ SOLN
INTRAMUSCULAR | Status: AC
  Filled 2020-08-22: qty 10

## 2020-08-22 MED ORDER — LEVETIRACETAM IN NACL 1000 MG/100ML IV SOLN
1000.0000 mg | Freq: Once | INTRAVENOUS | Status: AC
  Administered 2020-08-22: 1000 mg via INTRAVENOUS
  Filled 2020-08-22: qty 100

## 2020-08-22 MED ORDER — LACTATED RINGERS IV SOLN: Freq: Once | INTRAVENOUS | Status: AC

## 2020-08-22 MED ORDER — PROPOFOL 10 MG/ML IV BOLUS
INTRAVENOUS | Status: AC
  Filled 2020-08-22: qty 40

## 2020-08-22 MED ORDER — HEPARIN SOD (PORK) LOCK FLUSH 100 UNIT/ML IV SOLN
INTRAVENOUS | Status: AC
  Filled 2020-08-22: qty 5

## 2020-08-22 MED ORDER — PROMETHAZINE HCL 25 MG/ML IJ SOLN: 6.2500 mg | INTRAMUSCULAR | Status: DC | PRN

## 2020-08-22 MED ORDER — LACTATED RINGERS IV SOLN: INTRAVENOUS | Status: DC | PRN

## 2020-08-22 MED ORDER — HYDROMORPHONE HCL 1 MG/ML IJ SOLN
0.2500 mg | INTRAMUSCULAR | Status: DC | PRN
  Administered 2020-08-22 (×3): 0.5 mg via INTRAVENOUS
  Filled 2020-08-22 (×3): qty 0.5

## 2020-08-22 MED ORDER — LIDOCAINE HCL 1 % IJ SOLN
INTRAMUSCULAR | Status: DC | PRN
  Administered 2020-08-22: 10 mL
  Administered 2020-08-22: 5 mL via INTRADERMAL

## 2020-08-22 MED ORDER — KETOROLAC TROMETHAMINE 30 MG/ML IJ SOLN
30.0000 mg | Freq: Once | INTRAMUSCULAR | Status: AC
  Administered 2020-08-22: 30 mg via INTRAVENOUS
  Filled 2020-08-22: qty 1

## 2020-08-22 MED ORDER — HEPARIN SOD (PORK) LOCK FLUSH 100 UNIT/ML IV SOLN
INTRAVENOUS | Status: DC | PRN
  Administered 2020-08-22: 500 [IU]

## 2020-08-22 MED ORDER — CHLORHEXIDINE GLUCONATE 0.12 % MT SOLN
15.0000 mL | Freq: Once | OROMUCOSAL | Status: AC
  Administered 2020-08-22: 15 mL via OROMUCOSAL
  Filled 2020-08-22: qty 15

## 2020-08-22 MED ORDER — LIDOCAINE HCL (CARDIAC) PF 100 MG/5ML IV SOSY
PREFILLED_SYRINGE | INTRAVENOUS | Status: DC | PRN
  Administered 2020-08-22: 40 mg via INTRATRACHEAL

## 2020-08-22 MED ORDER — LIDOCAINE HCL (PF) 1 % IJ SOLN
INTRAMUSCULAR | Status: AC
  Filled 2020-08-22: qty 30

## 2020-08-22 MED ORDER — PROPOFOL 500 MG/50ML IV EMUL
INTRAVENOUS | Status: DC | PRN
  Administered 2020-08-22: 100 ug/kg/min via INTRAVENOUS

## 2020-08-22 MED ORDER — SODIUM CHLORIDE (PF) 0.9 % IJ SOLN
INTRAMUSCULAR | Status: DC | PRN
  Administered 2020-08-22: 500 mL

## 2020-08-22 MED ORDER — MEPERIDINE HCL 50 MG/ML IJ SOLN: 6.2500 mg | INTRAMUSCULAR | Status: DC | PRN

## 2020-08-22 MED ORDER — PROPOFOL 10 MG/ML IV BOLUS
INTRAVENOUS | Status: DC | PRN
  Administered 2020-08-22 (×2): 40 mg via INTRAVENOUS

## 2020-08-22 MED ORDER — MIDAZOLAM HCL 5 MG/5ML IJ SOLN
INTRAMUSCULAR | Status: DC | PRN
  Administered 2020-08-22 (×2): 1 mg via INTRAVENOUS

## 2020-08-22 MED ORDER — ORAL CARE MOUTH RINSE: 15.0000 mL | Freq: Once | OROMUCOSAL | Status: AC

## 2020-08-22 MED ORDER — CEFAZOLIN SODIUM-DEXTROSE 2-4 GM/100ML-% IV SOLN
2.0000 g | INTRAVENOUS | Status: AC
  Administered 2020-08-22: 2 g via INTRAVENOUS
  Filled 2020-08-22: qty 100

## 2020-08-22 SURGICAL SUPPLY — 35 items
ADH SKN CLS APL DERMABOND .7 (GAUZE/BANDAGES/DRESSINGS) ×2
APL PRP STRL LF ISPRP CHG 10.5 (MISCELLANEOUS) ×2
APPLICATOR CHLORAPREP 10.5 ORG (MISCELLANEOUS) ×4 IMPLANT
BAG DECANTER FOR FLEXI CONT (MISCELLANEOUS) ×4 IMPLANT
CLOTH BEACON ORANGE TIMEOUT ST (SAFETY) ×4 IMPLANT
COVER LIGHT HANDLE STERIS (MISCELLANEOUS) ×8 IMPLANT
COVER WAND RF STERILE (DRAPES) ×4 IMPLANT
DECANTER SPIKE VIAL GLASS SM (MISCELLANEOUS) ×4 IMPLANT
DERMABOND ADVANCED (GAUZE/BANDAGES/DRESSINGS) ×2
DERMABOND ADVANCED .7 DNX12 (GAUZE/BANDAGES/DRESSINGS) ×2 IMPLANT
DRAPE C-ARM FOLDED MOBILE STRL (DRAPES) ×4 IMPLANT
ELECT REM PT RETURN 9FT ADLT (ELECTROSURGICAL) ×4
ELECTRODE REM PT RTRN 9FT ADLT (ELECTROSURGICAL) ×2 IMPLANT
GLOVE BIOGEL PI IND STRL 6.5 (GLOVE) ×2 IMPLANT
GLOVE BIOGEL PI IND STRL 7.0 (GLOVE) ×4 IMPLANT
GLOVE BIOGEL PI INDICATOR 6.5 (GLOVE) ×2
GLOVE BIOGEL PI INDICATOR 7.0 (GLOVE) ×4
GLOVE SURG SS PI 7.5 STRL IVOR (GLOVE) ×4 IMPLANT
GOWN STRL REUS W/TWL LRG LVL3 (GOWN DISPOSABLE) ×8 IMPLANT
IV NS 500ML (IV SOLUTION) ×4
IV NS 500ML BAXH (IV SOLUTION) ×2 IMPLANT
KIT PEG SAFETY 20FR (KITS) ×4 IMPLANT
KIT PORT POWER 8FR ISP MRI (Port) ×4 IMPLANT
KIT TURNOVER KIT A (KITS) ×4 IMPLANT
MANIFOLD NEPTUNE II (INSTRUMENTS) ×4 IMPLANT
NDL HYPO 25X1 1.5 SAFETY (NEEDLE) ×2 IMPLANT
NEEDLE HYPO 25X1 1.5 SAFETY (NEEDLE) ×4 IMPLANT
PACK MINOR (CUSTOM PROCEDURE TRAY) ×4 IMPLANT
PAD ARMBOARD 7.5X6 YLW CONV (MISCELLANEOUS) ×4 IMPLANT
SET BASIN LINEN APH (SET/KITS/TRAYS/PACK) ×4 IMPLANT
SUT MNCRL AB 4-0 PS2 18 (SUTURE) ×4 IMPLANT
SUT VIC AB 3-0 SH 27 (SUTURE) ×4
SUT VIC AB 3-0 SH 27X BRD (SUTURE) ×2 IMPLANT
SYR 5ML LL (SYRINGE) ×4 IMPLANT
SYR CONTROL 10ML LL (SYRINGE) ×4 IMPLANT

## 2020-08-22 NOTE — Interval H&P Note (Signed)
History and Physical Interval Note:  08/22/2020 7:17 AM  [name]  has presented today for surgery, with the diagnosis of Squamous cell carcinoma epiglottis.  The various methods of treatment have been discussed with the patient and family. After consideration of risks, benefits and other options for treatment, the patient has consented to  Procedure(s): INSERTION PORT-A-CATH (Left) ESOPHAGOGASTRODUODENOSCOPY (EGD) WITH PROPOFOL (N/A) PERCUTANEOUS ENDOSCOPIC GASTROSTOMY (PEG) PLACEMENT (N/A) as a surgical intervention.  The patient's history has been reviewed, patient examined, no change in status, stable for surgery.  I have reviewed the patient's chart and labs.  Questions were answered to the patient's satisfaction.     Aviva Signs

## 2020-08-22 NOTE — Discharge Instructions (Signed)
PEG Tube Home Guide  A percutaneous endoscopic gastrostomy (PEG) tube is used to deliver food and fluids directly into the stomach. The tube has a clamp, a cap, and two anchors (bolsters). One bolster keeps the tube from coming out of the stomach. The other bolster holds the tube against the abdomen. You will be taught how to use and adjust your PEG tube before you leave the hospital. You will also be taught how to care for the opening (stoma) in your abdomen. Make sure that you understand:  How to care for your PEG tube.  How to care for your stoma.  How to give yourself feedings and medicines.  When to call your health care provider for help. Supplies needed:  Soapy water.  Clean, plain water.  Clean washcloth.  Bandage (dressing). This is optional.  Syringe. How to care for a PEG tube Check your PEG tube every day. Make sure:  It is not too tight. The bolster should rest gently over the stoma.  It is in the correct position. There is a mark on the tube that shows when it is in the correct position. Adjust the tube if you need to. Cleaning your stoma Clean your stoma every day. Follow these steps: 1. Wash your hands with soap and water. If soap and water are not available, use hand sanitizer. 2. Check the skin around the stoma for redness, rash, swelling, drainage, or extra tissue growth. If you notice any of these, call your health care provider. 3. Wash the stoma and the skin around it using a clean, soft washcloth. Clean using a circular motion, and wipe away from the stoma opening, not toward it. ? Use warm, soapy water, and only use cleansers recommended by your health care provider. ? Rinse the stoma area with plain water. ? Pat the stoma area dry. 4. Place a dressing over the stoma if your health care provider told you to do that.  Giving a feeding Your health care provider will give you instructions about:  How much nutrition and fluid you will need for each  feeding.  How often to have a feeding.  Whether to take medicine in the tube by itself or with a feeding. To give yourself a feeding, follow these steps: 1. Lay out all of the equipment that you will need. 2. Make sure that the nutritional formula is at room temperature. 3. Wash your hands with soap and water. 4. Position yourself so that you are upright. You will need to stay upright throughout the feeding and for at least 30 minutes after the feeding. 5. Make sure the syringe plunger is pushed in. Place the tip of the syringe in clean water, and slowly pull the plunger to bring (draw up) the water into the syringe. 6. Remove the clamp and the cap from the PEG tube. 7. Push the water out of the syringe to clean (flush) the tube. 8. If the tube is clear, draw up the formula into the syringe. Make sure to use the right amount for each feeding and add water if necessary. 9. Slowly push the formula from the syringe through the tube. 10. After the feeding, flush the tube with water. 11. Put the clamp and the cap on the tube. Giving medicine To give yourself medicine, follow these steps: 1. Lay out all of the equipment that you will need. 2. If your medicine is in tablet form, crush the tablet and dissolve it in water. 3. Wash your hands with soap  and water. 4. Position yourself so that you are upright. You will need to stay upright while you give yourself medicine and for at least 30 minutes afterward. 5. Make sure the syringe plunger is pushed in. Place the tip of the syringe in clean water, and slowly pull the plunger to bring (draw up) the water into the syringe. 6. Remove the clamp and the cap from the PEG tube. 7. Push the water out of the syringe to clean (flush) the tube. 8. If the tube is clear, draw up the medicine into the syringe. 9. Slowly push the medicine from the syringe through the tube. 10. Flush the tube with water. 11. Put the clamp and the cap on the tube. Do not take  sustained release (SR) medicines through your tube. If you are unsure if your medicine is an SR medicine, ask your health care provider or pharmacist. Contact a health care provider if you have:  Soreness, redness, or irritation around your stoma.  Abdominal pain or bloating during or after your feedings.  Nausea, constipation, or diarrhea that will not go away.  A fever.  Problems with your PEG tube. Get help right away if:  Your tube is blocked.  Your tube falls out.  You have pain around your stoma.  You are bleeding from your stoma.  Your tube is leaking.  You choke or you have trouble breathing during or after a feeding. Summary  A percutaneous endoscopic gastrostomy (PEG) tube is used to deliver food and fluids directly into the stomach.  You will be taught how to use and adjust your PEG tube. You will also be taught how to care for the stoma in your abdomen.  Your health care provider will give you instructions on how to give yourself nutritional formula and medicines through your PEG tube.  Contact your health care provider if you have a fever or soreness, redness, or irritation around your stoma.  Get help right away if your tube leaks, is blocked, or falls out. Get help right away if you have pain or bleeding around your stoma. This information is not intended to replace advice given to you by your health care provider. Make sure you discuss any questions you have with your health care provider. Document Revised: 01/01/2019 Document Reviewed: 10/28/2017 Elsevier Patient Education  [place] An implanted port is a device that is placed under the skin. It is usually placed in the chest. The device can be used to give IV medicine, to take blood, or for dialysis. You may have an implanted port if:  You need IV medicine that would be irritating to the small veins in your hands or arms.  You need IV medicines, such as antibiotics,  for a long period of time.  You need IV nutrition for a long period of time.  You need dialysis. Having a port means that your health care provider will not need to use the veins in your arms for these procedures. You may have fewer limitations when using a port than you would if you used other types of long-term IVs, and you will likely be able to return to normal activities after your incision heals. An implanted port has two main parts:  Reservoir. The reservoir is the part where a needle is inserted to give medicines or draw blood. The reservoir is round. After it is placed, it appears as a small, raised area under your skin.  Catheter. The catheter is a  thin, flexible tube that connects the reservoir to a vein. Medicine that is inserted into the reservoir goes into the catheter and then into the vein. How is my port accessed? To access your port:  A numbing cream may be placed on the skin over the port site.  Your health care provider will put on a mask and sterile gloves.  The skin over your port will be cleaned carefully with a germ-killing soap and allowed to dry.  Your health care provider will gently pinch the port and insert a needle into it.  Your health care provider will check for a blood return to make sure the port is in the vein and is not clogged.  If your port needs to remain accessed to get medicine continuously (constant infusion), your health care provider will place a clear bandage (dressing) over the needle site. The dressing and needle will need to be changed every week, or as told by your health care provider. What is flushing? Flushing helps keep the port from getting clogged. Follow instructions from your health care provider about how and when to flush the port. Ports are usually flushed with saline solution or a medicine called heparin. The need for flushing will depend on how the port is used:  If the port is only used from time to time to give medicines or  draw blood, the port may need to be flushed: ? Before and after medicines have been given. ? Before and after blood has been drawn. ? As part of routine maintenance. Flushing may be recommended every 4-6 weeks.  If a constant infusion is running, the port may not need to be flushed.  Throw away any syringes in a disposal container that is meant for sharp items (sharps container). You can buy a sharps container from a pharmacy, or you can make one by using an empty hard plastic bottle with a cover. How long will my port stay implanted? The port can stay in for as long as your health care provider thinks it is needed. When it is time for the port to come out, a surgery will be done to remove it. The surgery will be similar to the procedure that was done to put the port in. Follow these instructions at home:   Flush your port as told by your health care provider.  If you need an infusion over several days, follow instructions from your health care provider about how to take care of your port site. Make sure you: ? Wash your hands with soap and water before you change your dressing. If soap and water are not available, use alcohol-based hand sanitizer. ? Change your dressing as told by your health care provider. ? Place any used dressings or infusion bags into a plastic bag. Throw that bag in the trash. ? Keep the dressing that covers the needle clean and dry. Do not get it wet. ? Do not use scissors or sharp objects near the tube. ? Keep the tube clamped, unless it is being used.  Check your port site every day for signs of infection. Check for: ? Redness, swelling, or pain. ? Fluid or blood. ? Pus or a bad smell.  Protect the skin around the port site. ? Avoid wearing bra straps that rub or irritate the site. ? Protect the skin around your port from seat belts. Place a soft pad over your chest if needed.  Bathe or shower as told by your health care provider. The site may  get wet as long  as you are not actively receiving an infusion.  Return to your normal activities as told by your health care provider. Ask your health care provider what activities are safe for you.  Carry a medical alert card or wear a medical alert bracelet at all times. This will let health care providers know that you have an implanted port in case of an emergency. Get help right away if:  You have redness, swelling, or pain at the port site.  You have fluid or blood coming from your port site.  You have pus or a bad smell coming from the port site.  You have a fever. Summary  Implanted ports are usually placed in the chest for long-term IV access.  Follow instructions from your health care provider about flushing the port and changing bandages (dressings).  Take care of the area around your port by avoiding clothing that puts pressure on the area, and by watching for signs of infection.  Protect the skin around your port from seat belts. Place a soft pad over your chest if needed.  Get help right away if you have a fever or you have redness, swelling, pain, drainage, or a bad smell at the port site. This information is not intended to replace advice given to you by your health care provider. Make sure you discuss any questions you have with your health care provider. Document Revised: 02/06/2019 Document Reviewed: 11/17/2016 Elsevier Patient Education  2020 [place] Anesthesia, Adult, Care After This sheet gives you information about how to care for yourself after your procedure. Your health care provider may also give you more specific instructions. If you have problems or questions, contact your health care provider. What can I expect after the procedure? After the procedure, the following side effects are common:  Pain or discomfort at the IV site.  Nausea.  Vomiting.  Sore throat.  Trouble concentrating.  Feeling cold or chills.  Weak or  tired.  Sleepiness and fatigue.  Soreness and body aches. These side effects can affect parts of the body that were not involved in surgery. Follow these instructions at home:  For at least 24 hours after the procedure:  Have a responsible adult stay with you. It is important to have someone help care for you until you are awake and alert.  Rest as needed.  Do not: ? Participate in activities in which you could fall or become injured. ? Drive. ? Use heavy machinery. ? Drink alcohol. ? Take sleeping pills or medicines that cause drowsiness. ? Make important decisions or sign legal documents. ? Take care of children on your own. Eating and drinking  Follow any instructions from your health care provider about eating or drinking restrictions.  When you feel hungry, start by eating small amounts of foods that are soft and easy to digest (bland), such as toast. Gradually return to your regular diet.  Drink enough fluid to keep your urine pale yellow.  If you vomit, rehydrate by drinking water, juice, or clear broth. General instructions  If you have sleep apnea, surgery and certain medicines can increase your risk for breathing problems. Follow instructions from your health care provider about wearing your sleep device: ? Anytime you are sleeping, including during daytime naps. ? While taking prescription pain medicines, sleeping medicines, or medicines that make you drowsy.  Return to your normal activities as told by your health care provider. Ask your health care  provider what activities are safe for you.  Take over-the-counter and prescription medicines only as told by your health care provider.  If you smoke, do not smoke without supervision.  Keep all follow-up visits as told by your health care provider. This is important. Contact a health care provider if:  You have nausea or vomiting that does not get better with medicine.  You cannot eat or drink without  vomiting.  You have pain that does not get better with medicine.  You are unable to pass urine.  You develop a skin rash.  You have a fever.  You have redness around your IV site that gets worse. Get help right away if:  You have difficulty breathing.  You have chest pain.  You have blood in your urine or stool, or you vomit blood. Summary  After the procedure, it is common to have a sore throat or nausea. It is also common to feel tired.  Have a responsible adult stay with you for the first 24 hours after general anesthesia. It is important to have someone help care for you until you are awake and alert.  When you feel hungry, start by eating small amounts of foods that are soft and easy to digest (bland), such as toast. Gradually return to your regular diet.  Drink enough fluid to keep your urine pale yellow.  Return to your normal activities as told by your health care provider. Ask your health care provider what activities are safe for you. This information is not intended to replace advice given to you by your health care provider. Make sure you discuss any questions you have with your health care provider. Document Revised: 10/18/2017 Document Reviewed: 05/31/2017 Elsevier Patient Education  [place].

## 2020-08-22 NOTE — Anesthesia Postprocedure Evaluation (Signed)
Anesthesia Post Note  Patient: [name]  Procedure(s) Performed: INSERTION PORT-A-CATH (Left ) ESOPHAGOGASTRODUODENOSCOPY (EGD) WITH PROPOFOL (N/A ) PERCUTANEOUS ENDOSCOPIC GASTROSTOMY (PEG) PLACEMENT (N/A )  Patient location during evaluation: PACU Anesthesia Type: Jose Level of consciousness: awake and alert, oriented and patient cooperative Pain management: pain level controlled Vital Signs Assessment: post-procedure vital signs reviewed and stable Respiratory status: spontaneous breathing, respiratory function stable and nonlabored ventilation Cardiovascular status: blood pressure returned to baseline and stable Postop Assessment: no apparent nausea or vomiting Anesthetic complications: no   No complications documented.   Last Vitals:  Vitals:   08/22/20 0640  BP: (!) 154/85  Pulse: 83  Resp: 13  Temp: 37.3 C  SpO2: 95%    Last Pain:  Vitals:   08/22/20 0640  TempSrc: Oral  PainSc: 7                  [name]

## 2020-08-22 NOTE — Op Note (Signed)
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  The risks and benefits of both procedures including bleeding, infection, bowel injury, and pneumothorax were fully explained to the patient, who gave informed consent.  Procedure note: The patient was placed in the Trendelenburg position after the left upper chest was prepped and draped using usual sterile technique with ChloraPrep.  Surgical site confirmation was performed.  1% Xylocaine was used for local anesthesia.  An incision was made below the left clavicle.  A subcutaneous pocket was formed.  A needle was advanced into the left subclavian vein using the Seldinger technique without difficulty.  The guidewire was then advanced into the right atrium under fluoroscopic guidance.  An introducer and peel-away sheath were placed over the guidewire.  The catheter was inserted through the peel-away sheath and the peel-away sheath was removed.  The catheter was then attached to the port and the port placed in subcutaneous pocket.  Adequate positioning was confirmed by fluoroscopy.  Good backflow of venous blood was noted on aspiration of the port.  The port was flushed with heparin flush.  Subcutaneous layer was reapproximated using a 3-0 Vicryl subcutaneous suture.  The skin was closed using a 4-0 Monocryl subcuticular suture.  Dermabond was applied.  Next, an EGD with PEG was performed.  The endoscope was advanced down to the second portion of the duodenum without difficulty.  The pylorus was noted to be widely patent.  The left upper quadrant of the abdomen was prepped and draped using usual sterile  technique with Betadine.  Surgical site confirmation was performed.  1% Xylocaine was used for local anesthesia.  A needle catheter was advanced into the atrium under endoscopic guidance.  The guidewire was then inserted and grasped with a snare.  This was brought out through the mouth.  A 20 French gastrostomy tube was then attached and using the pull technique, the gastrostomy tube was positioned without difficulty.  It was at the skin level at 3 cm.  The endoscope was advanced back down into the stomach and the bulb was noted to be in appropriate position.  All air was then evacuated from the stomach prior to removal of the endoscope.  The bolster was again placed at the 3 cm Beckem Tomberlin.  Bacitracin ointment and a dry sterile dressing was then applied.  All tape and needle counts were correct at the end of the procedures.  The patient was transferred to PACU in stable condition.  Chest x-ray will be performed at that time.  Complications: None  EBL: Minimal  Specimen: None

## 2020-08-22 NOTE — Transfer of Care (Signed)
Immediate Anesthesia Transfer of Care Note  Patient: TYREN DUGAR  Procedure(s) Performed: INSERTION PORT-A-CATH (Left ) ESOPHAGOGASTRODUODENOSCOPY (EGD) WITH PROPOFOL (N/A ) PERCUTANEOUS ENDOSCOPIC GASTROSTOMY (PEG) PLACEMENT (N/A )  Patient Location: PACU  Anesthesia Type:General  Level of Consciousness: awake, alert , oriented and patient cooperative  Airway & Oxygen Therapy: Patient Spontanous Breathing and Patient connected to nasal cannula oxygen  Post-op Assessment: Report given to RN and Post -op Vital signs reviewed and stable  Post vital signs: Reviewed and stable  Last Vitals:  Vitals Value Taken Time  BP 169/92 08/22/20 0908  Temp    Pulse 85 08/22/20 0909  Resp 15 08/22/20 0909  SpO2 95 % 08/22/20 0909  Vitals shown include unvalidated device data.  Last Pain:  Vitals:   08/22/20 0640  TempSrc: Oral  PainSc: 7          Complications: No complications documented.

## 2020-08-22 NOTE — Anesthesia Preprocedure Evaluation (Addendum)
Anesthesia Evaluation  Patient identified by MRN, date of birth, ID band Patient awake    Reviewed: Allergy & Precautions, NPO status , Patient's Chart, lab work & pertinent test results  History of Anesthesia Complications Negative for: history of anesthetic complications  Airway Mallampati: III  TM Distance: >3 FB Neck ROM: Full   Comment: Squamous cell carcinoma of epiglottis  Possible difficult airway Dental  (+) Edentulous Upper, Edentulous Lower   Pulmonary pneumonia, resolved, COPD,  COPD inhaler, Current SmokerPatient did not abstain from smoking.,   Room air saturation 92%  breath sounds clear to auscultation       Cardiovascular Exercise Tolerance: Poor hypertension, Pt. on medications Normal cardiovascular exam Rhythm:Regular Rate:Normal     Neuro/Psych Seizures - (last seizure 6 months ago),  PSYCHIATRIC DISORDERS    GI/Hepatic PUD, GERD  Medicated,(+)     substance abuse  alcohol use and marijuana use,   Endo/Other  negative endocrine ROS  Renal/GU negative Renal ROS     Musculoskeletal negative musculoskeletal ROS (+)   Abdominal   Peds  Hematology negative hematology ROS (+)   Anesthesia Other Findings Squamous cell carcinoma of epiglottis Hyponatremia   Missed keppra this morning, will give keppra 1000 mg iv preop  Reproductive/Obstetrics negative OB ROS                            Anesthesia Physical Anesthesia Plan  ASA: IV  Anesthesia Plan: General   Post-op Pain Management:    Induction: Intravenous  PONV Risk Score and Plan: 2 and TIVA, Midazolam and Ondansetron  Airway Management Planned: Nasal Cannula, Natural Airway and Simple Face Mask  Additional Equipment:   Intra-op Plan:   Post-operative Plan:   Informed Consent: I have reviewed the patients History and Physical, chart, labs and discussed the procedure including the risks, benefits and  alternatives for the proposed anesthesia with the patient or authorized representative who has indicated his/her understanding and acceptance.     Dental advisory given  Plan Discussed with: Surgeon and CRNA  Anesthesia Plan Comments: (Possible GA with ETT/LMA was discussed.)       Anesthesia Quick Evaluation

## 2020-08-23 ENCOUNTER — Other Ambulatory Visit: Payer: Self-pay

## 2020-08-23 ENCOUNTER — Ambulatory Visit (HOSPITAL_COMMUNITY): Payer: Medicaid Other | Admitting: Speech Pathology

## 2020-08-23 ENCOUNTER — Ambulatory Visit (HOSPITAL_COMMUNITY)
Admission: RE | Admit: 2020-08-23 | Discharge: 2020-08-23 | Disposition: A | Discharge status: Home / Self Care | Payer: Medicaid Other | Source: Ambulatory Visit | Attending: Hematology | Admitting: Hematology

## 2020-08-23 ENCOUNTER — Encounter (HOSPITAL_COMMUNITY): Payer: Self-pay | Admitting: General Surgery

## 2020-08-23 ENCOUNTER — Encounter (HOSPITAL_COMMUNITY): Payer: Self-pay | Admitting: *Deleted

## 2020-08-23 DIAGNOSIS — R1319 Other dysphagia: Secondary | ICD-10-CM | POA: Diagnosis present

## 2020-08-23 DIAGNOSIS — R1312 Dysphagia, oropharyngeal phase: Secondary | ICD-10-CM | POA: Diagnosis not present

## 2020-08-23 DIAGNOSIS — C321 Malignant neoplasm of supraglottis: Secondary | ICD-10-CM | POA: Diagnosis present

## 2020-08-23 NOTE — Progress Notes (Signed)
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 He has not received a callback so I was leaving a message as well. Message let on nurse line. Pain to peg tube site is a 10/10. He states he is taking the oxycodone 10 mg but not working. At times he repositions himself and pain is a little better. I also left message to nurse navigator on secure chat.

## 2020-08-23 NOTE — Therapy (Signed)
[place], [place], [address] Phone: [phone]   Fax:  [phone]  Modified Barium Swallow  Patient Details  Name: [name] MRN: [id number] Date of Birth: 1976-04-12 No data recorded  Encounter Date: 08/23/2020   End of Session - 08/23/20 1615    Visit Number 1    Number of Visits 6    Date for SLP Re-Evaluation 10/12/20    Authorization Type Medicaid [employer]   27 visits per calendar year, [phone] does not require auth   SLP Start Time 1405    SLP Stop Time  1432    SLP Time Calculation (min) 27 min    Activity Tolerance Patient tolerated treatment well           Past Medical History:  Diagnosis Date  . Back pain   . Bradycardia 08/17/2011  . COPD (chronic obstructive pulmonary disease) ([place])   . ETOH abuse   . GERD (gastroesophageal reflux disease)   . Macrocytosis 08/17/2011  . Pancreatitis   . Pneumonia   . SAH (subarachnoid hemorrhage) ([place])   . Seizures ([place])    last one 6 mos ago    Past Surgical History:  Procedure Laterality Date  . BACK SURGERY    . ESOPHAGOGASTRODUODENOSCOPY (EGD) WITH PROPOFOL N/A 08/14/2017   Procedure: ESOPHAGOGASTRODUODENOSCOPY (EGD) WITH PROPOFOL;  Surgeon: Daneil Dolin, MD;  [facility];  Service: Endoscopy;  Laterality: N/A;  1:30pm  . ESOPHAGOGASTRODUODENOSCOPY (EGD) WITH PROPOFOL N/A 03/13/2018   Procedure: ESOPHAGOGASTRODUODENOSCOPY (EGD) WITH PROPOFOL;  Surgeon: Daneil Dolin, MD;  Location: [facility];  Service: Endoscopy;  Laterality: N/A;  2:30pm  . ESOPHAGOGASTRODUODENOSCOPY (EGD) WITH PROPOFOL N/A 08/22/2020   Procedure: ESOPHAGOGASTRODUODENOSCOPY (EGD) WITH PROPOFOL;  Surgeon: Aviva Signs, MD;  Location: [facility];  Service: General;  Laterality: N/A;  . PEG PLACEMENT N/A 08/22/2020   Procedure: PERCUTANEOUS ENDOSCOPIC GASTROSTOMY (PEG) PLACEMENT;  Surgeon: Aviva Signs, MD;  Location: [facility];  Service: General;   Laterality: N/A;  . PORTACATH PLACEMENT Left 08/22/2020   Procedure: INSERTION PORT-A-CATH;  Surgeon: Aviva Signs, MD;  Location: [facility];  Service: General;  Laterality: Left;  . SEPTOPLASTY      There were no vitals filed for this visit.   Subjective Assessment - 08/23/20 1607    Subjective "I can swallow ok."    Special Tests MBSS    Currently in Pain? Yes    Pain Score 5     Pain Location Epigastric               General - 08/23/20 1607      General Information   Date of Onset 07/26/20    HPI [name] is a 44 yo male who was referred for a clinical swallow evaluation by Dr. Derek Jack due to recent diagnosis of epiglottic squamous cell carcinoma. Pt had an ultrasound of his right neck for right neck mass 04/28/2020. PET CT scan on 08/05/2020 shows epiglottis is thickened and irregular with hypermetabolic.  Left level 2 lymph node positive.  Right-sided level 2 nodal metastasis measuring 2.7 x 2.2 cm.  Bilateral areas of peribronchovascular reticulonodular opacities with hypermetabolism favoring infection.  No extra cervical metastatic disease.  Thoracic nodes with low-level of hypermetabolism favored to be reactive. Fiberoptic exam by Dr. Benjamine Mola on 08/02/2020 showed tongue base mass noted that involved the superior aspect of the epiglottis.  Arytenoid mucosa was edematous with slight erythema.  True vocal cords were pale  yellow and edematous but without mass or lesion. FNA of the right neck lymph node on 08/02/2020 consistent with squamous cell carcinoma. 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 He is an active smoker (1 pack/day for 30 years) and drinks 3 beers per day for the past 10 years. He will likely undergo chemoradiation therapy. He is scheduled for port placement and PEG tube on 08/22/2020 with Dr. Arnoldo Morale. He reports dysphagia to solid foods and has lost at least 23 pounds in the past few months. He reports that he typically only eats one meal per  day. BSE was completed on 08/11/20 with recommendation for MBSS.    Type of Study MBS-Modified Barium Swallow Study    Diet Prior to this Study Regular;Thin liquids    Temperature Spikes Noted No    Respiratory Status Room air    History of Recent Intubation No    Behavior/Cognition Cooperative;Alert    Oral Cavity Assessment Within Functional Limits    Oral Care Completed by SLP No    Oral Cavity - Dentition Edentulous    Vision Functional for self feeding    Self-Feeding Abilities Able to feed self    Patient Positioning Upright in chair    Baseline Vocal Quality Normal;Wet    Volitional Cough Strong    Volitional Swallow Able to elicit    Anatomy Within functional limits   Epiglottis was difficult to visualize   Pharyngeal Secretions Not observed secondary MBS              Oral Preparation/Oral Phase - 08/23/20 1608      Oral Preparation/Oral Phase   Oral Phase Within functional limits      Electrical stimulation - Oral Phase   Was Electrical Stimulation Used No            Pharyngeal Phase - 08/23/20 1609      Pharyngeal Phase   Pharyngeal Phase Impaired      Pharyngeal - Nectar   Pharyngeal- Nectar Cup Swallow initiation at vallecula;Reduced epiglottic inversion;Reduced airway/laryngeal closure;Pharyngeal residue - pyriform      Pharyngeal - Thin   Pharyngeal- Thin Teaspoon Swallow initiation at vallecula;Reduced epiglottic inversion;Reduced airway/laryngeal closure;Penetration/Apiration after swallow;Trace aspiration;Pharyngeal residue - pyriform;Inter-arytenoid space residue    Pharyngeal Material enters airway, passes BELOW cords without attempt by patient to eject out (silent aspiration)    Pharyngeal- Thin Cup Swallow initiation at vallecula;Reduced epiglottic inversion;Reduced airway/laryngeal closure;Penetration/Apiration after swallow;Trace aspiration;Penetration/Aspiration during swallow;Pharyngeal residue - pyriform;Inter-arytenoid space residue     Pharyngeal Material enters airway, passes BELOW cords without attempt by patient to eject out (silent aspiration)      Pharyngeal - Solids   Pharyngeal- Puree Swallow initiation at vallecula;Reduced epiglottic inversion;Lateral channel residue;Pharyngeal residue - pyriform;Pharyngeal residue - posterior pharnyx    Pharyngeal- Mechanical Soft Pharyngeal residue - pyriform;Swallow initiation at vallecula;Reduced epiglottic inversion;Pharyngeal residue - posterior pharnyx    Pharyngeal- Pill Within functional limits      Electrical Stimulation - Pharyngeal Phase   Was Electrical Stimulation Used No            Cricopharyngeal Phase - 08/23/20 1614      Cervical Esophageal Phase   Cervical Esophageal Phase Within functional limits                SLP Short Term Goals - 08/23/20 1620      SLP SHORT TERM GOAL #1   Title Complete MBSS    Time 2    Period Weeks  Status Achieved   completed 08/23/2020   Target Date 08/25/20      SLP SHORT TERM GOAL #2   Title Pt will verbalize 3+ signs/symptoms of possible aspiration with min cues from SLP and written source as needed.    Baseline Presented this date    Time 2    Period Months    Status On-going    Target Date 10/13/20      SLP SHORT TERM GOAL #3   Title Pt will complete oropharyngeal swallowing exercises x10 each after introduced by SLP and given mi/mod cues for accurate execution.    Baseline Will introduce after MBSS    Time 2    Period Months    Status On-going    Target Date 10/13/20            SLP Long Term Goals - 08/23/20 1621      SLP LONG TERM GOAL #1   Title Pt will demonstrate safe and efficient consumption of self-regulated regular textures and unrestricted liquids with use of compensatory strategies as needed    Baseline Pt consuming soft solids with difficulty, all liquids    Time 3    Period Months    Status On-going            Plan - 08/23/20 1616    Clinical Impression Statement MBSS  reveals moderate pharyngeal phase dysphagia characterized by reduced epiglottic deflection with incomplete laryngeal vestibule closure and approximation with posterior pharyngeal wall due to epiglottic mass resulting in penetration during and after swallow and aspiration after the swallow in trace/min amounts with thins (and strongly suspect with secretions) due to residuals in pyriforms/arytenoids. Pt reported not feeling aspiration and did not produce a cough, however he presented with wet vocal quality and throat clearing and aspirate could be seen mixed with secretions bobbing in and out of laryngeal vestibule. Although aspiration was not observed with NTL, residuals were greater in the pyriforms and did not completely clear. Pt with mi/mod pyriform residue with mechanical soft textures which were diminished with sips of liquid and repeat swallows. Barium tablet was swallowed without issue with cup thin, however trace aspiration of thins after the swallow. Head turns Left and Right were attempted and found to be of little benefit (Pt also turned AP). Breath hold was mildly effective on 50% of trials thin when cued to do just before swallow and followed by a throat clear/cough and repeat swallow, however some residuals still remained near arytenoids and in pyriforms. Recommend that Pt continue to eat by mouth (mech soft, purees, and thins ok) with excellent oral care and focus on strong cough post swallow and cough/throat clear whenever Pt notices wet vocal quality. Also recommend dysphagia treatment with swallowing exercises throughout the course of his cancer treatment and thereafter to help manage the effects of post radiation sequelae. Pt just had PEG placed yesterday and will follow up with tube feedings per RD. SLP will encourage Pt to attend SLP treatment once per week during his treatment.    Speech Therapy Frequency 1x /week    Duration --   6 weeks   Treatment/Interventions Aspiration precaution  training;SLP instruction and feedback;Compensatory strategies;Pharyngeal strengthening exercises;Diet toleration management by SLP;Compensatory techniques;Patient/family education    Potential to Achieve Goals Good    Potential Considerations Other (comment)   chemoradiation sequelae   SLP Home Exercise Plan Pt will completed HEP as assigned to facilitate carryover of treatment strategies and techniques in home environment with use of written cues  Consulted and Agree with Plan of Care Patient;Family member/caregiver    Family Member Consulted [name], significant other           Patient will benefit from skilled therapeutic intervention in order to improve the following deficits and impairments:   Dysphagia, oropharyngeal phase     Recommendations/Treatment - 08/23/20 1614      Swallow Evaluation Recommendations   SLP Diet Recommendations Dysphagia 3 (mechanical soft);Thin    Liquid Administration via Cup;No straw    Medication Administration Whole meds with puree    Supervision Patient able to self feed    Compensations Multiple dry swallows after each bite/sip;Clear throat after each swallow    Postural Changes Seated upright at 90 degrees;Remain upright for at least 30 minutes after feeds/meals            Prognosis - 08/23/20 1614      Prognosis   Prognosis for Safe Diet Advancement Fair    Barriers to Reach Goals Severity of deficits    Barriers/Prognosis Comment radiation sequelae      Individuals Consulted   Consulted and Agree with Results and Recommendations Patient    Report Sent to  Referring physician           Problem List Patient Active Problem List   Diagnosis Date Noted  . 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 Abdominal pain, epigastric 12/31/2017  . Gastric ulcer 08/12/2017  . ETOH abuse   . Atypical chest pain   . Hypercalcemia 10/08/2016  . Hypokalemia 10/08/2016  . Diarrhea 10/09/2015  . Pancreatitis, alcoholic, acute     . Alcohol-induced chronic pancreatitis ([place])   . Acute pancreatitis   . Pancreatitis 12/26/2014  . Pancreatic pseudocyst 12/26/2014  . Hyponatremia 12/26/2014  . HTN (hypertension) 07/27/2013  . Abdominal pain 07/11/2012  . Macrocytosis 08/17/2011  . Marijuana abuse 08/17/2011  . Bradycardia 08/17/2011  . Alcohol withdrawal ([place]) 08/17/2011  . Elevated blood pressure 07/08/2011  . Left against medical advice 07/08/2011  . Acute alcoholic pancreatitis 62/83/6629  . Alcohol abuse, continuous 07/07/2011  . Tobacco abuse 07/07/2011  . Seizure disorder ([place]) 07/07/2011  . Adynamic ileus ([place]) 07/07/2011  . Wheezing 07/07/2011  . COPD (chronic obstructive pulmonary disease) ([place]) 07/07/2011   Thank you,  Genene Churn, [place]  [place] 08/23/2020, 4:22 PM  [facility] [address] [place], [place], [address] Phone: [phone]   Fax:  [phone]  Name: [name] MRN: [id number] Date of Birth: November 17, 1975

## 2020-08-23 NOTE — Progress Notes (Signed)
Patient was here today at hospital for swallow study with SLP.  He came to cancer center prior to study to ask about pain in his abdomen.  It is new pain that started after PEG placement.  He states that he has other pains also but his medication is not touching the pain in his abdomen.  I spoke with Dr. Delton Coombes and he wants patient evaluated by surgeon prior to [place] giving more pain medication.  I have left VM for patient to contact Dr. Arnoldo Morale tomorrow morning to be evaluated.

## 2020-08-24 ENCOUNTER — Other Ambulatory Visit (HOSPITAL_COMMUNITY): Payer: Self-pay | Admitting: Hematology

## 2020-08-24 ENCOUNTER — Encounter (HOSPITAL_COMMUNITY): Payer: Self-pay

## 2020-08-24 NOTE — Progress Notes (Signed)
START ON PATHWAY REGIMEN - Head and Neck     A cycle is every 7 days:     Cisplatin   **Always confirm dose/schedule in your pharmacy ordering system**  Patient Characteristics: Oropharynx, HPV Negative/Unknown, Preoperative or Nonsurgical Candidate (Clinical Staging), Stage IVA/B Disease Classification: Oropharynx HPV Status: Awaiting Test Results Therapeutic Status: Preoperative or Nonsurgical Candidate (Clinical Staging) AJCC T Category: cT3 AJCC 8 Stage Grouping: IVA AJCC N Category: cN2c AJCC M Category: cM0 Intent of Therapy: Curative Intent, Discussed with Patient

## 2020-08-24 NOTE — Progress Notes (Signed)
P16 for HPV testing requested on accession # [id number]. I spoke with Shantel at [facility] to place this order per Dr. Delton Coombes.

## 2020-08-25 ENCOUNTER — Inpatient Hospital Stay (HOSPITAL_COMMUNITY): Payer: Medicaid Other

## 2020-08-25 ENCOUNTER — Other Ambulatory Visit (HOSPITAL_COMMUNITY): Payer: Self-pay

## 2020-08-25 ENCOUNTER — Ambulatory Visit (INDEPENDENT_AMBULATORY_CARE_PROVIDER_SITE_OTHER): Payer: Medicaid Other | Admitting: General Surgery

## 2020-08-25 ENCOUNTER — Encounter (HOSPITAL_COMMUNITY): Payer: Self-pay

## 2020-08-25 ENCOUNTER — Encounter: Payer: Self-pay | Admitting: General Surgery

## 2020-08-25 ENCOUNTER — Other Ambulatory Visit: Payer: Self-pay

## 2020-08-25 VITALS — BP 146/87 | HR 70 | Temp 96.7°F | Resp 18 | Ht 67.0 in | Wt 109.0 lb

## 2020-08-25 DIAGNOSIS — C01 Malignant neoplasm of base of tongue: Secondary | ICD-10-CM | POA: Diagnosis not present

## 2020-08-25 DIAGNOSIS — Z09 Encounter for follow-up examination after completed treatment for conditions other than malignant neoplasm: Secondary | ICD-10-CM

## 2020-08-25 DIAGNOSIS — C321 Malignant neoplasm of supraglottis: Secondary | ICD-10-CM

## 2020-08-25 LAB — COMPREHENSIVE METABOLIC PANEL
ALT: 13 U/L (ref 0–44)
AST: 23 U/L (ref 15–41)
Albumin: 4 g/dL (ref 3.5–5.0)
Alkaline Phosphatase: 83 U/L (ref 38–126)
Anion gap: 12 (ref 5–15)
BUN: <5 mg/dL — ABNORMAL LOW (ref 6–20)
CO2: 26 mmol/L (ref 22–32)
Calcium: 9 mg/dL (ref 8.9–10.3)
Chloride: 92 mmol/L — ABNORMAL LOW (ref 98–111)
Creatinine, Ser: <0.3 mg/dL — ABNORMAL LOW (ref 0.61–1.24)
Glucose, Bld: 78 mg/dL (ref 70–99)
Potassium: 3.3 mmol/L — ABNORMAL LOW (ref 3.5–5.1)
Sodium: 130 mmol/L — ABNORMAL LOW (ref 135–145)
Total Bilirubin: 0.4 mg/dL (ref 0.3–1.2)
Total Protein: 8.5 g/dL — ABNORMAL HIGH (ref 6.5–8.1)

## 2020-08-25 LAB — PHOSPHORUS: Phosphorus: 3.4 mg/dL (ref 2.5–4.6)

## 2020-08-25 LAB — MAGNESIUM: Magnesium: 2.1 mg/dL (ref 1.7–2.4)

## 2020-08-25 MED ORDER — OXYCODONE HCL 5 MG PO TABS: 10.0000 mg | ORAL_TABLET | ORAL | 0 refills | Status: DC | PRN

## 2020-08-25 MED ORDER — PROCHLORPERAZINE MALEATE 10 MG PO TABS
10.0000 mg | ORAL_TABLET | Freq: Four times a day (QID) | ORAL | 2 refills | Status: DC | PRN
Start: 2020-08-25 — End: 2021-04-12

## 2020-08-25 NOTE — Progress Notes (Signed)

## 2020-08-25 NOTE — Progress Notes (Signed)
Subjective:     [name]  Patient here for follow-up status post PEG and Port-A-Cath placement.  He is still having a significant amount of pain at the PEG site.  No drainage has been noted.  It has not been used yet.  He has been on 10 mg of Oxycodone. Objective:    BP (!) 146/87   Pulse 70   Temp (!) 96.7 F (35.9 C) (Other (Comment))   Resp 18   Ht 5\' 7"  (1.702 m)   Wt 109 lb (49.4 kg)   SpO2 97%   BMI 17.07 kg/m   General:  alert, cooperative and no distress  Abdomen soft, PEG site clean and dry.  The exit site is at the 3 cm [name] which is unchanged from before.     Assessment:    PEG site pain.  The patient has been on narcotics before and he may be having some breakthrough pain.    Plan:   Will let oncology know that they can increase the dose of oxycodone.  This appears to be more muscular pain from the PEG site.  The PEG can be used.  Should he continue to have pain in several weeks, a muscle relaxant may be indicated.

## 2020-08-25 NOTE — Progress Notes (Signed)
I met with this patient and his significant other, [name], today during chemo education meeting. Patient seemed uninterested in chemo education, stating that he "didn't care for this shit." I encouraged the patient to be engaged in chemo education so that he had a clear understanding of his treatment regimen. Patient states that he would like his pain medication increased. Dr. Delton Coombes made aware of uncontrolled pain, medication adjustments made per Dr. Delton Coombes. I also discussed the importance of PEG tube care with this patient. Patient states that his insertion site hurts too much to use, but that the surgeon told him to utilize the tube because the soreness is coming from the surgical incision made in the muscle during the procedure. I reinforce that it is important to at least regularly flush the PEG tube with at least 38m of water daily to ensure PEG tube remains patent. Patient and significant other verbalize understanding and state that they plan to begin PEG tube flushes today.

## 2020-08-25 NOTE — Progress Notes (Signed)
Return phone call from [facility] received, they have received the P16 order and will begin working on this stain. They advise me that they will update me with the status of this testing once it has been sent out.

## 2020-08-26 ENCOUNTER — Encounter (HOSPITAL_COMMUNITY): Payer: Self-pay

## 2020-08-26 ENCOUNTER — Ambulatory Visit (HOSPITAL_COMMUNITY): Payer: Medicaid Other

## 2020-08-26 NOTE — Progress Notes (Signed)
Nutrition Follow-up:  Patient with squamous cell carcinoma of epiglottis.  Planning chemotherapy and radiation therapy.  Patient s/p PEG placement on 10/25.  Spoke with patient via phone.  Patient has not flushed tube today but plans to do it later today and says he is going to give a carton of formula in tube. Patient reports pain from PEG placement and has been seen by surgeon.   Reports that he has been eating soup and potted meat sandwiches   Medications: reviewed  Labs: reviewed  Anthropometrics:   Weight 109 lb on 10/28 increased from 107 lb   Estimated Energy Needs  Kcals: 1500-1700 Protein: 75-85 g Fluid: 1.5 L  NUTRITION DIAGNOSIS: Inadequate oral intake continues   INTERVENTION:  Discussed importance of patient flushing tube and starting nutrition.  Patient already malnourished before starting treatment due to weight loss, only eating 1 meal per day, dysphagia.  Patient states that he will flush tube with water and give 1 carton of tube feeding today and flush afterwards.  Planning to give 2 cartons of formula tomorrow.  Encouraged patient to give tube feeding 3 hours are more apart.   Contact phone number given    MONITORING, EVALUATION, GOAL: weight trends, intake, tube feeding   NEXT VISIT: Friday, Nov 5th, phone  Tommie Bohlken B. Zenia Resides, [place], [place] Registered Dietitian [phone] (mobile)

## 2020-08-26 NOTE — Progress Notes (Signed)
Call received from [facility] at [facility]. Rodman Key informed me that there is no tissue available to do P16 testing. Dr. Delton Coombes made aware.

## 2020-08-29 ENCOUNTER — Other Ambulatory Visit (HOSPITAL_COMMUNITY): Payer: Medicaid Other

## 2020-08-29 ENCOUNTER — Ambulatory Visit (HOSPITAL_COMMUNITY): Payer: Medicaid Other | Admitting: Hematology

## 2020-08-29 ENCOUNTER — Ambulatory Visit (HOSPITAL_COMMUNITY): Payer: Medicaid Other

## 2020-08-30 ENCOUNTER — Ambulatory Visit (HOSPITAL_COMMUNITY): Payer: Medicaid Other

## 2020-08-30 NOTE — Progress Notes (Signed)
. °  Pharmacist Chemotherapy Monitoring - Initial Assessment    Anticipated start date: 09/05/20    Regimen:   Are orders appropriate based on the patients diagnosis, regimen, and cycle? Yes  Does the plan date match the patients scheduled date? Yes  Is the sequencing of drugs appropriate? Yes  Are the premedications appropriate for the patients regimen? Yes  Prior Authorization for treatment is: Approved o If applicable, is the correct biosimilar selected based on the patient's insurance? not applicable  Organ Function and Labs:  Are dose adjustments needed based on the patient's renal function, hepatic function, or hematologic function? No  Are appropriate labs ordered prior to the start of patient's treatment? Yes  Other organ system assessment, if indicated: cisplatin: baseline audiogram  The following baseline labs, if indicated, have been ordered: N/A  Dose Assessment:  Are the drug doses appropriate? Yes  Are the following correct: o Drug concentrations Yes o IV fluid compatible with drug Yes o Administration routes Yes o Timing of therapy Yes  If applicable, does the patient have documented access for treatment and/or plans for port-a-cath placement? yes  If applicable, have lifetime cumulative doses been properly documented and assessed? not applicable Lifetime Dose Tracking  No doses have been documented on this patient for the following tracked chemicals: Doxorubicin, Epirubicin, Idarubicin, Daunorubicin, Mitoxantrone, Bleomycin, Oxaliplatin, Carboplatin, Liposomal Doxorubicin  o   Toxicity Monitoring/Prevention:  The patient has the following take home antiemetics prescribed: Prochlorperazine  The patient has the following take home medications prescribed: N/A  Medication allergies and previous infusion related reactions, if applicable, have been reviewed and addressed. Yes  The patient's current medication list has been assessed for drug-drug  interactions with their chemotherapy regimen. no significant drug-drug interactions were identified on review.  Order Review:  Are the treatment plan orders signed? No  Is the patient scheduled to see a provider prior to their treatment? Yes  I verify that I have reviewed each item in the above checklist and answered each question accordingly.  [name] 08/30/2020 2:14 PM

## 2020-08-31 ENCOUNTER — Ambulatory Visit (HOSPITAL_COMMUNITY): Payer: Medicaid Other

## 2020-09-02 ENCOUNTER — Telehealth (HOSPITAL_COMMUNITY): Payer: Self-pay

## 2020-09-02 ENCOUNTER — Ambulatory Visit (HOSPITAL_COMMUNITY): Payer: Medicaid Other

## 2020-09-02 NOTE — Progress Notes (Addendum)
Nutrition Follow-up:  Patient with squamous cell carcinoma of epiglottis.  Patient to start concurrent chemotherapy and radiation therapy on 11/8.  PEG placed on 10/25  Patient called RD back this pm for nutrition follow-up.  Patient reports that his niece has been giving him about 2 cartons of tube feeding daily.  Reports that she is giving it to him slowly and spacing them out at least 4 hours apart.  Reports bowel movement this am (hard, large stool).  Reports that he felt bloating some after giving tube feeding.  Says he was drinking beer after feeding and thinks that is why he was bloated.  Says he drinks 12 pack per day.   Reports he got choked on chicken recently.  "I eat what I can."    Medications: reviewed   Labs: reviewed  Anthropometrics:   No new weight   Estimated Energy Needs  Kcals: 1500-1700 Protein: 75-85 g Fluid: 1.5 L  NUTRITION DIAGNOSIS: Inadequate oral intake continues   INTERVENTION:  Recommend patient give 3 cartons of osmolite 1.5 per day to provide more nutrition.  Flush with at least 8ml of water before and after each feeding.   Encouraged cutting back on alcohol and to discuss amounts with Dr Raliegh Ip.  Encouraged drinking fluids, not alcohol to help with constipation.   Patient has contact information     MONITORING, EVALUATION, GOAL: weight trends, tube feeding   NEXT VISIT: Nov 12 phone f/u  Earnestine Tuohey B. Zenia Resides, [place], [place] Registered Dietitian [phone] (mobile)

## 2020-09-02 NOTE — Telephone Encounter (Signed)
Nutrition  Called patient for scheduled nutrition follow-up. No answer. Left message on voicemail with call back number.   Marcellus Pulliam B. Zenia Resides, [place], [place] Registered Dietitian [phone] (mobile)

## 2020-09-04 ENCOUNTER — Encounter (HOSPITAL_COMMUNITY): Payer: Self-pay | Admitting: Hematology

## 2020-09-04 DIAGNOSIS — Z95828 Presence of other vascular implants and grafts: Secondary | ICD-10-CM

## 2020-09-04 HISTORY — DX: Presence of other vascular implants and grafts: Z95.828

## 2020-09-04 MED ORDER — LIDOCAINE-PRILOCAINE 2.5-2.5 % EX CREA: TOPICAL_CREAM | CUTANEOUS | 3 refills | Status: DC

## 2020-09-05 ENCOUNTER — Inpatient Hospital Stay (HOSPITAL_COMMUNITY): Payer: Medicaid Other | Attending: Hematology

## 2020-09-05 ENCOUNTER — Inpatient Hospital Stay (HOSPITAL_COMMUNITY): Payer: Medicaid Other

## 2020-09-05 ENCOUNTER — Other Ambulatory Visit (HOSPITAL_COMMUNITY): Payer: Self-pay | Admitting: *Deleted

## 2020-09-05 ENCOUNTER — Other Ambulatory Visit: Payer: Self-pay

## 2020-09-05 ENCOUNTER — Inpatient Hospital Stay (HOSPITAL_BASED_OUTPATIENT_CLINIC_OR_DEPARTMENT_OTHER): Payer: Medicaid Other | Admitting: Hematology

## 2020-09-05 ENCOUNTER — Encounter (HOSPITAL_COMMUNITY): Payer: Self-pay | Admitting: Hematology

## 2020-09-05 VITALS — BP 123/73 | HR 99 | Temp 98.0°F | Resp 18 | Wt 107.1 lb

## 2020-09-05 VITALS — BP 149/75 | HR 76 | Temp 97.1°F | Resp 18

## 2020-09-05 DIAGNOSIS — Z7289 Other problems related to lifestyle: Secondary | ICD-10-CM | POA: Diagnosis not present

## 2020-09-05 DIAGNOSIS — E871 Hypo-osmolality and hyponatremia: Secondary | ICD-10-CM | POA: Insufficient documentation

## 2020-09-05 DIAGNOSIS — C321 Malignant neoplasm of supraglottis: Secondary | ICD-10-CM | POA: Diagnosis present

## 2020-09-05 DIAGNOSIS — Z95828 Presence of other vascular implants and grafts: Secondary | ICD-10-CM

## 2020-09-05 DIAGNOSIS — Z5111 Encounter for antineoplastic chemotherapy: Secondary | ICD-10-CM | POA: Insufficient documentation

## 2020-09-05 DIAGNOSIS — F17210 Nicotine dependence, cigarettes, uncomplicated: Secondary | ICD-10-CM | POA: Insufficient documentation

## 2020-09-05 DIAGNOSIS — M542 Cervicalgia: Secondary | ICD-10-CM | POA: Insufficient documentation

## 2020-09-05 DIAGNOSIS — R634 Abnormal weight loss: Secondary | ICD-10-CM | POA: Diagnosis not present

## 2020-09-05 DIAGNOSIS — C770 Secondary and unspecified malignant neoplasm of lymph nodes of head, face and neck: Secondary | ICD-10-CM | POA: Insufficient documentation

## 2020-09-05 DIAGNOSIS — C01 Malignant neoplasm of base of tongue: Secondary | ICD-10-CM | POA: Diagnosis present

## 2020-09-05 LAB — CBC WITH DIFFERENTIAL/PLATELET
Abs Immature Granulocytes: 0.03 10*3/uL (ref 0.00–0.07)
Basophils Absolute: 0.1 10*3/uL (ref 0.0–0.1)
Basophils Relative: 1 %
Eosinophils Absolute: 0.2 10*3/uL (ref 0.0–0.5)
Eosinophils Relative: 2 %
HCT: 38.3 % — ABNORMAL LOW (ref 39.0–52.0)
Hemoglobin: 13 g/dL (ref 13.0–17.0)
Immature Granulocytes: 0 %
Lymphocytes Relative: 22 %
Lymphs Abs: 2.7 10*3/uL (ref 0.7–4.0)
MCH: 34.5 pg — ABNORMAL HIGH (ref 26.0–34.0)
MCHC: 33.9 g/dL (ref 30.0–36.0)
MCV: 101.6 fL — ABNORMAL HIGH (ref 80.0–100.0)
Monocytes Absolute: 1.5 10*3/uL — ABNORMAL HIGH (ref 0.1–1.0)
Monocytes Relative: 13 %
Neutro Abs: 7.5 10*3/uL (ref 1.7–7.7)
Neutrophils Relative %: 62 %
Platelets: 306 10*3/uL (ref 150–400)
RBC: 3.77 MIL/uL — ABNORMAL LOW (ref 4.22–5.81)
RDW: 12.6 % (ref 11.5–15.5)
WBC: 12 10*3/uL — ABNORMAL HIGH (ref 4.0–10.5)
nRBC: 0 % (ref 0.0–0.2)

## 2020-09-05 LAB — COMPREHENSIVE METABOLIC PANEL
ALT: 13 U/L (ref 0–44)
AST: 21 U/L (ref 15–41)
Albumin: 3.9 g/dL (ref 3.5–5.0)
Alkaline Phosphatase: 95 U/L (ref 38–126)
Anion gap: 10 (ref 5–15)
BUN: <5 mg/dL — ABNORMAL LOW (ref 6–20)
CO2: 27 mmol/L (ref 22–32)
Calcium: 8.9 mg/dL (ref 8.9–10.3)
Chloride: 90 mmol/L — ABNORMAL LOW (ref 98–111)
Creatinine, Ser: 0.3 mg/dL — ABNORMAL LOW (ref 0.61–1.24)
GFR, Estimated: >60 mL/min (ref >60)
Glucose, Bld: 110 mg/dL — ABNORMAL HIGH (ref 70–99)
Potassium: 3.5 mmol/L (ref 3.5–5.1)
Sodium: 127 mmol/L — ABNORMAL LOW (ref 135–145)
Total Bilirubin: 0.5 mg/dL (ref 0.3–1.2)
Total Protein: 8.2 g/dL — ABNORMAL HIGH (ref 6.5–8.1)

## 2020-09-05 LAB — MAGNESIUM: Magnesium: 2.1 mg/dL (ref 1.7–2.4)

## 2020-09-05 MED ORDER — SODIUM CHLORIDE 0.9% FLUSH
10.0000 mL | INTRAVENOUS | Status: DC | PRN
  Administered 2020-09-05: 10 mL

## 2020-09-05 MED ORDER — SODIUM CHLORIDE 0.9 % IV SOLN: Freq: Once | INTRAVENOUS | Status: AC

## 2020-09-05 MED ORDER — SODIUM CHLORIDE 0.9 % IV SOLN
Freq: Once | INTRAVENOUS | Status: AC
  Filled 2020-09-05: qty 10

## 2020-09-05 MED ORDER — SODIUM CHLORIDE 0.9 % IV SOLN
150.0000 mg | Freq: Once | INTRAVENOUS | Status: AC
  Administered 2020-09-05: 150 mg via INTRAVENOUS
  Filled 2020-09-05: qty 150

## 2020-09-05 MED ORDER — HEPARIN SOD (PORK) LOCK FLUSH 100 UNIT/ML IV SOLN
500.0000 [IU] | Freq: Once | INTRAVENOUS | Status: AC | PRN
  Administered 2020-09-05: 500 [IU]

## 2020-09-05 MED ORDER — OXYCODONE HCL 5 MG PO TABS
10.0000 mg | ORAL_TABLET | Freq: Once | ORAL | Status: AC
  Administered 2020-09-05: 10 mg via ORAL
  Filled 2020-09-05: qty 2

## 2020-09-05 MED ORDER — SODIUM CHLORIDE 0.9 % IV SOLN
40.0000 mg/m2 | Freq: Once | INTRAVENOUS | Status: AC
  Administered 2020-09-05: 60 mg via INTRAVENOUS
  Filled 2020-09-05: qty 60

## 2020-09-05 MED ORDER — CHLORDIAZEPOXIDE HCL 25 MG PO CAPS: 25.0000 mg | ORAL_CAPSULE | Freq: Four times a day (QID) | ORAL | 0 refills | Status: DC

## 2020-09-05 MED ORDER — PALONOSETRON HCL INJECTION 0.25 MG/5ML
0.2500 mg | Freq: Once | INTRAVENOUS | Status: AC
  Administered 2020-09-05: 0.25 mg via INTRAVENOUS

## 2020-09-05 MED ORDER — PALONOSETRON HCL INJECTION 0.25 MG/5ML
INTRAVENOUS | Status: AC
  Filled 2020-09-05: qty 5

## 2020-09-05 MED ORDER — SODIUM CHLORIDE 0.9 % IV SOLN
10.0000 mg | Freq: Once | INTRAVENOUS | Status: AC
  Administered 2020-09-05: 10 mg via INTRAVENOUS
  Filled 2020-09-05: qty 10

## 2020-09-05 NOTE — Patient Instructions (Signed)
[facility] at [facility] Discharge Instructions  Labs drawn from portacath today   Thank you for choosing [facility] at [facility] to provide your oncology and hematology care.  To afford each patient quality time with our provider, please arrive at least 15 minutes before your scheduled appointment time.   If you have a lab appointment with the Cancer Center please come in thru the Main Entrance and check in at the main information desk.  You need to re-schedule your appointment should you arrive 10 or more minutes late.  We strive to give you quality time with our providers, and arriving late affects you and other patients whose appointments are after yours.  Also, if you no show three or more times for appointments you may be dismissed from the clinic at the providers discretion.     Again, thank you for choosing [facility].  Our hope is that these requests will decrease the amount of time that you wait before being seen by our physicians.       _____________________________________________________________  Should you have questions after your visit to [facility], please contact our office at [phone] and follow the prompts.  Our office hours are 8:00 a.m. and 4:30 p.m. Monday - Friday.  Please note that voicemails left after 4:00 p.m. may not be returned until the following business day.  We are closed weekends and major holidays.  You do have access to a nurse 24-7, just call the main number to the clinic [phone] and do not press any options, hold on the line and a nurse will answer the phone.    For prescription refill requests, have your pharmacy contact our office and allow 72 hours.    Due to Covid, you will need to wear a mask upon entering the hospital. If you do not have a mask, a mask will be given to you at the Main Entrance upon arrival. For doctor visits, patients may have 1 support person age 18  or older with them. For treatment visits, patients can not have anyone with them due to social distancing guidelines and our immunocompromised population.     

## 2020-09-05 NOTE — Progress Notes (Signed)
Patient was assessed by Dr. Delton Coombes and labs have been reviewed.  Patient forgot to take his pain medication today, orders received for one pain pill dose today.  Patient is okay to proceed with treatment today. Primary RN and pharmacy aware.

## 2020-09-05 NOTE — Progress Notes (Signed)
Patient presents today for treatment and follow up visit with Dr. Delton Coombes. MAR reviewed. Vital signs stable.   Verbal order received Dr. Delton Coombes may give one hour of post Cisplatin hydration today only. Patient to return tomorrow for hydration only.   Voided 50 mls Voided 150 mls Voided 500 mls  Treatment given today per MD orders. Tolerated infusion without adverse affects. Vital signs stable. No complaints at this time. Discharged from clinic ambulatory in stable condition. Alert and oriented x 3. F/U with [facility] as scheduled.

## 2020-09-05 NOTE — Patient Instructions (Signed)
[place] at [facility] Discharge Instructions  You were seen today by Dr. Delton Coombes. He went over your recent results. You received your first treatment today; start taking Compazine every 6 hours to prevent you from getting nauseous after getting your chemo. Cut down your drinking to 1 can of beer daily and drink more water daily to flush out the chemo from your kidneys. Increase your intake of Boost/Ensure to 4-5 cans daily. Dr. Delton Coombes will see you back in 1 week for labs and follow up.   Thank you for choosing [place] at [facility] to provide your oncology and hematology care.  To afford each patient quality time with our provider, please arrive at least 15 minutes before your scheduled appointment time.   If you have a lab appointment with the [facility] please come in thru the Main Entrance and check in at the main information desk  You need to re-schedule your appointment should you arrive 10 or more minutes late.  We strive to give you quality time with our providers, and arriving late affects you and other patients whose appointments are after yours.  Also, if you no show three or more times for appointments you may be dismissed from the clinic at the providers discretion.     Again, thank you for choosing [facility].  Our hope is that these requests will decrease the amount of time that you wait before being seen by our physicians.       _____________________________________________________________  Should you have questions after your visit to [facility], please contact our office at [phone] between the hours of 8:00 a.m. and 4:30 p.m.  Voicemails left after 4:00 p.m. will not be returned until the following business day.  For prescription refill requests, have your pharmacy contact our office and allow 72 hours.    Cancer Center Support Programs:   > Cancer Support Group  2nd Tuesday of the  month 1pm-2pm, Journey Room

## 2020-09-05 NOTE — Progress Notes (Signed)
[place], [place] [address]   CLINIC:  Medical Oncology/Hematology  PCP:  Jani Gravel, MD 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      CANCER STAGING: Cancer Staging No matching staging information was found for the patient.  INTERVAL HISTORY:  [name], a 44 y.o. male, returns for routine follow-up and consideration for firstcycle of chemotherapy. [name] was last seen on 08/08/2020.  Due for initiating cycle #1 of cisplatin today.   Today he is accompanied by his niece. Overall, he tells me he has been feeling okay. He had his first radiation treatment today. He complains of pain in his throat and right side of neck and reports that his left neck is beginning to hurt as well. He is taking oxycodone 6 times daily. He is drinking 2 cans of beer by mouth but is not tolerating food by mouth; he was drinking 12 cans of beer daily recently. He is putting 2.5 cans of Ensure through his tube and is not able to take in 3 cans of Ensure because he becomes bloated. He has had DT's and had to be hospitalized previously.  His niece lives nearby and checks in on him every day.  Overall, he  feels ready for first cycle of chemo today.    REVIEW OF SYSTEMS:  Review of Systems  Constitutional: Positive for fatigue (depleted).  Respiratory: Positive for shortness of breath.   Musculoskeletal: Positive for back pain (8/10 lower back pain) and neck pain (R neck pain).  Neurological: Positive for numbness (toes).  Psychiatric/Behavioral: The patient is nervous/anxious.   All other systems reviewed and are negative.   PAST MEDICAL/SURGICAL HISTORY:  Past Medical History:  Diagnosis Date   Back pain    Bradycardia 08/17/2011   COPD (chronic obstructive pulmonary disease) (HCC)    ETOH abuse    GERD (gastroesophageal reflux disease)    Macrocytosis 08/17/2011   Pancreatitis    Pneumonia    Port-A-Cath in place 09/04/2020   SAH (subarachnoid hemorrhage) ([place])    Seizures ([place])    last one 6 mos ago   Past Surgical History:  Procedure Laterality Date   BACK SURGERY     ESOPHAGOGASTRODUODENOSCOPY (EGD) WITH PROPOFOL N/A 08/14/2017   Procedure: ESOPHAGOGASTRODUODENOSCOPY (EGD) WITH PROPOFOL;  Surgeon: Daneil Dolin, MD;  [facility];  Service: Endoscopy;  Laterality: N/A;  1:30pm   ESOPHAGOGASTRODUODENOSCOPY (EGD) WITH PROPOFOL N/A 03/13/2018   Procedure: ESOPHAGOGASTRODUODENOSCOPY (EGD) WITH PROPOFOL;  Surgeon: Daneil Dolin, MD;  [facility];  Service: Endoscopy;  Laterality: N/A;  2:30pm   ESOPHAGOGASTRODUODENOSCOPY (EGD) WITH PROPOFOL N/A 08/22/2020   Procedure: ESOPHAGOGASTRODUODENOSCOPY (EGD) WITH PROPOFOL;  Surgeon: Aviva Signs, MD;  Location: [facility];  Service: General;  Laterality: N/A;   PEG PLACEMENT N/A 08/22/2020   Procedure: PERCUTANEOUS ENDOSCOPIC GASTROSTOMY (PEG) PLACEMENT;  Surgeon: Aviva Signs, MD;  Location: [facility];  Service: General;  Laterality: N/A;   PORTACATH PLACEMENT Left 08/22/2020   Procedure: INSERTION PORT-A-CATH;  Surgeon: Aviva Signs, MD;  Location: [facility];  Service: General;  Laterality: Left;     SEPTOPLASTY      SOCIAL HISTORY:  Social History   Socioeconomic History   Marital status: Single    Spouse name: Not on file   Number of children: Not on file   Years of education: Not on file   Highest education level: Not on file  Occupational History   Not on file  Tobacco Use   Smoking status: Current Every Day Smoker    Packs/day: 1.00    Years: 20.00    Pack years: 20.00    Types: Cigarettes   Smokeless tobacco: Never Used  Vaping Use   Vaping Use: Never used  Substance and Sexual Activity   Alcohol use: Yes    Comment: couple of beers daily   Drug use: Not Currently    Types: Marijuana    Comment: months    Sexual activity: Not Currently  Other Topics Concern   Not on file  Social History Narrative   Not on file   Social Determinants of Health   Financial Resource Strain: Low Risk    Difficulty of Paying Living Expenses: Not very hard  Food Insecurity: No Food Insecurity   Worried About Charity fundraiser in the Last Year: Never true   Ran Out of Food in the Last Year: Never true  Transportation Needs: No Transportation Needs   Lack of Transportation (Medical): No   Lack of Transportation (Non-Medical): No  Physical Activity: Sufficiently Active   Days of Exercise per Week: 7 days   Minutes of Exercise per Session: 60 min  Stress: Stress Concern Present   Feeling of Stress : To some extent  Social Connections: Moderately Isolated   Frequency of Communication with Friends and Family: More than three times a week   Frequency of Social Gatherings with Friends and Family: More than three times a week   Attends Religious Services: Never   Marine scientist or Organizations: No   Attends Music therapist: Never   Marital Status: Living with partner  Intimate Partner Violence: Not At Risk   Fear of Current or Ex-Partner: No   Emotionally Abused: No   Physically Abused: No   Sexually Abused: No     FAMILY HISTORY:  Family History  Problem Relation Age of Onset   Coronary artery disease Mother        deceased age 83   Seizures Father    Alcohol abuse Father    Colon cancer Neg Hx     CURRENT MEDICATIONS:  Current Outpatient Medications  Medication Sig Dispense Refill   albuterol (PROVENTIL HFA;VENTOLIN HFA) 108 (90 BASE) MCG/ACT inhaler Inhale 2 puffs into the lungs every 6 (six) hours as needed for wheezing or shortness of breath.     albuterol (PROVENTIL) (2.5 MG/3ML) 0.083% nebulizer solution Take 2.5 mg by nebulization every 6 (six) hours as needed for wheezing or shortness of breath.     amLODipine (NORVASC) 10 MG tablet Take 10 mg by mouth daily.      carbamazepine (TEGRETOL) 200 MG tablet Take 400 mg by mouth 2 (two) times daily.  CISPLATIN IV Inject into the vein once a week.     diclofenac Sodium (VOLTAREN) 1 % GEL Apply 2 g topically daily as needed (pain).      gabapentin (NEURONTIN) 300 MG capsule Take 300 mg by mouth 3 (three) times daily.     levETIRAcetam (KEPPRA) 500 MG tablet Take 1,000 mg by mouth 2 (two) times daily.      lidocaine-prilocaine (EMLA) cream Apply small amount to port a cath site and cover with plastic wrap 1 hour prior to chemotherapy appointments 30 g 3   oxyCODONE (OXY IR/ROXICODONE) 5 MG immediate release tablet Take 2 tablets (10 mg total) by mouth every 4 (four) hours as needed for severe pain. 240 tablet 0   pantoprazole (PROTONIX) 40 MG tablet Take 1 tablet (40 mg total) by mouth 2 (two) times daily before a meal. 60 tablet 5   prochlorperazine (COMPAZINE) 10 MG tablet Take 1 tablet (10 mg total) by mouth every 6 (six) hours as needed for nausea or vomiting. 30 tablet 2   No current facility-administered medications for this visit.    ALLERGIES:  No Known Allergies  PHYSICAL EXAM:  Performance status (ECOG): 1 - Symptomatic but completely ambulatory  Vitals:   09/05/20 1026  BP: 123/73  Pulse: 99  Resp: 18   Temp: 98 F (36.7 C)  SpO2: 93%   Wt Readings from Last 3 Encounters:  09/05/20 107 lb 1.6 oz (48.6 kg)  08/25/20 109 lb (49.4 kg)  08/09/20 107 lb (48.5 kg)   Physical Exam Vitals reviewed.  Constitutional:      Appearance: Normal appearance.  Cardiovascular:     Rate and Rhythm: Normal rate and regular rhythm.     Pulses: Normal pulses.     Heart sounds: Normal heart sounds.  Pulmonary:     Effort: Pulmonary effort is normal.     Breath sounds: Normal breath sounds.  Chest:     Comments: Port-a-Cath in L chest Abdominal:     Comments: PEG tube  Lymphadenopathy:     Cervical: Cervical adenopathy present.     Right cervical: Superficial cervical adenopathy present.  Neurological:     General: No focal deficit present.     Mental Status: He is alert and oriented to person, place, and time.  Psychiatric:        Mood and Affect: Mood normal.        Behavior: Behavior normal.     LABORATORY DATA:  I have reviewed the labs as listed.  CBC Latest Ref Rng & Units 09/05/2020 08/08/2020 09/04/2019  WBC 4.0 - 10.5 K/uL 12.0(H) 8.7 11.4(H)  Hemoglobin 13.0 - 17.0 g/dL 13.0 14.8 14.5  Hematocrit 39 - 52 % 38.3(L) 42.7 43.0  Platelets 150 - 400 K/uL 306 171 237   CMP Latest Ref Rng & Units 09/05/2020 08/25/2020 08/22/2020  Glucose 70 - 99 mg/dL 110(H) 78 115(H)  BUN 6 - 20 mg/dL <5(L) <5(L) <5(L)  Creatinine 0.61 - 1.24 mg/dL 0.30(L) <0.30(L) 0.30(L)  Sodium 135 - 145 mmol/L 127(L) 130(L) 128(L)  Potassium 3.5 - 5.1 mmol/L 3.5 3.3(L) 3.5  Chloride 98 - 111 mmol/L 90(L) 92(L) 91(L)  CO2 22 - 32 mmol/L 27 26 27   Calcium 8.9 - 10.3 mg/dL 8.9 9.0 9.0  Total Protein 6.5 - 8.1 g/dL 8.2(H) 8.5(H) -  Total Bilirubin 0.3 - 1.2 mg/dL 0.5 0.4 -  Alkaline Phos 38 - 126 U/L 95 83 -  AST 15 - 41 U/L 21 23 -  ALT  0 - 44 U/L 13 13 -    DIAGNOSTIC IMAGING:  I have independently reviewed the scans and discussed with the patient. DG Chest Port 1 View  Result Date: 08/22/2020 CLINICAL  DATA:  Port-A-Cath placement EXAM: PORTABLE CHEST 1 VIEW COMPARISON:  August 07, 2017 FINDINGS: Port-A-Cath tip is in the superior vena cava. No pneumothorax. There is fibrosis in the upper lobes bilaterally, more on the left than on the right as well as mild bibasilar atelectasis. There is underlying hyperexpansion. Heart size and pulmonary vascularity are normal. No adenopathy. No bone lesions IMPRESSION: Central catheter tip in superior vena cava. No pneumothorax. Areas of fibrosis in the upper lobes, slightly more on the left than on the right. Mild bibasilar atelectasis. Lungs hyperexpanded. Stable cardiac silhouette. Electronically Signed   By: Lowella Grip III M.D.   On: 08/22/2020 09:35   DG Swallowing Func-Speech Pathology  Result Date: 08/23/2020 [place], [place], [address] Phone: [phone]   Fax:  [phone]  Modified Barium Swallow  Patient Details Name: [name] MRN: [id number] Date of Birth: 05-May-1976 No data recorded  Encounter Date: 08/23/2020       End of Session - 08/23/20 1615       Visit Number 1    Number of Visits 6    Date for SLP Re-Evaluation 10/12/20    Authorization Type Medicaid [employer]   27 visits per calendar year, [phone] does not require auth   SLP Start Time 1405    SLP Stop Time  47    SLP Time Calculation (min) 27 min    Activity Tolerance Patient tolerated treatment well           Past Medical History: Diagnosis Date  Back pain   Bradycardia 08/17/2011  COPD (chronic obstructive pulmonary disease) ([place])   ETOH abuse   GERD (gastroesophageal reflux disease)   Macrocytosis 08/17/2011  Pancreatitis   Pneumonia   SAH (subarachnoid hemorrhage) ([place])   Seizures ([place])   last one 6 mos ago       Past Surgical History: Procedure Laterality Date  BACK SURGERY    ESOPHAGOGASTRODUODENOSCOPY (EGD) WITH PROPOFOL N/A 08/14/2017  Procedure:  ESOPHAGOGASTRODUODENOSCOPY (EGD) WITH PROPOFOL;  Surgeon: Daneil Dolin, MD;  [facility];  Service: Endoscopy;  Laterality: N/A;  1:30pm  ESOPHAGOGASTRODUODENOSCOPY (EGD) WITH PROPOFOL N/A 03/13/2018  Procedure: ESOPHAGOGASTRODUODENOSCOPY (EGD) WITH PROPOFOL;  Surgeon: Daneil Dolin, MD;  [facility];  Service: Endoscopy;  Laterality: N/A;  2:30pm  ESOPHAGOGASTRODUODENOSCOPY (EGD) WITH PROPOFOL N/A 08/22/2020  Procedure: ESOPHAGOGASTRODUODENOSCOPY (EGD) WITH PROPOFOL;  Surgeon: Aviva Signs, MD;  Location: [facility];  Service: General;  Laterality: N/A;  PEG PLACEMENT N/A 08/22/2020  Procedure: PERCUTANEOUS ENDOSCOPIC GASTROSTOMY (PEG) PLACEMENT;  Surgeon: Aviva Signs, MD;  Location: [facility];  Service: General;  Laterality: N/A;  PORTACATH PLACEMENT Left 08/22/2020  Procedure: INSERTION PORT-A-CATH;  Surgeon: Aviva Signs, MD;  Location: [facility];  Service: General;  Laterality: Left;  SEPTOPLASTY     There were no vitals filed for this visit.       Subjective Assessment - 08/23/20 1607       Subjective "I can swallow ok."    Special Tests MBSS    Currently in Pain? Yes    Pain Score 5     Pain Location Epigastric              General - 08/23/20 1607  General Information   Date of Onset 07/26/20    HPI Rose Hippler is a 44 yo male who was referred for a clinical swallow evaluation by Dr. Derek Jack due to recent diagnosis of epiglottic squamous cell carcinoma. Pt had an ultrasound of his right neck for right neck mass 04/28/2020. PET CT scan on 08/05/2020 shows epiglottis is thickened and irregular with hypermetabolic.  Left level 2 lymph node positive.  Right-sided level 2 nodal metastasis measuring 2.7 x 2.2 cm.  Bilateral areas of peribronchovascular reticulonodular opacities with hypermetabolism favoring infection.  No extra cervical metastatic disease.  Thoracic nodes with low-level of hypermetabolism favored to be reactive. Fiberoptic  exam by Dr. Benjamine Mola on 08/02/2020 showed tongue base mass noted that involved the superior aspect of the epiglottis.  Arytenoid mucosa was edematous with slight erythema.  True vocal cords were pale yellow and edematous but without mass or lesion. FNA of the right neck lymph node on 08/02/2020 consistent with squamous cell carcinoma. 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 He is an active smoker (1 pack/day for 30 years) and drinks 3 beers per day for the past 10 years. He will likely undergo chemoradiation therapy. He is scheduled for port placement and PEG tube on 08/22/2020 with Dr. Arnoldo Morale. He reports dysphagia to solid foods and has lost at least 23 pounds in the past few months. He reports that he typically only eats one meal per day. BSE was completed on 08/11/20 with recommendation for MBSS.    Type of Study MBS-Modified Barium Swallow Study    Diet Prior to this Study Regular;Thin liquids    Temperature Spikes Noted No    Respiratory Status Room air    History of Recent Intubation No    Behavior/Cognition Cooperative;Alert    Oral Cavity Assessment Within Functional Limits    Oral Care Completed by SLP No    Oral Cavity - Dentition Edentulous    Vision Functional for self feeding    Self-Feeding Abilities Able to feed self    Patient Positioning Upright in chair    Baseline Vocal Quality Normal;Wet    Volitional Cough Strong    Volitional Swallow Able to elicit    Anatomy Within functional limits   Epiglottis was difficult to visualize   Pharyngeal Secretions Not observed secondary MBS              Oral Preparation/Oral Phase - 08/23/20 1608           Oral Preparation/Oral Phase   Oral Phase Within functional limits        Electrical stimulation - Oral Phase   Was Electrical Stimulation Used No             Pharyngeal Phase - 08/23/20 1609           Pharyngeal Phase   Pharyngeal Phase Impaired        Pharyngeal - Nectar   Pharyngeal- Nectar Cup Swallow  initiation at vallecula;Reduced epiglottic inversion;Reduced airway/laryngeal closure;Pharyngeal residue - pyriform        Pharyngeal - Thin   Pharyngeal- Thin Teaspoon Swallow initiation at vallecula;Reduced epiglottic inversion;Reduced airway/laryngeal closure;Penetration/Apiration after swallow;Trace aspiration;Pharyngeal residue - pyriform;Inter-arytenoid space residue    Pharyngeal Material enters airway, passes BELOW cords without attempt by patient to eject out (silent aspiration)    Pharyngeal- Thin Cup Swallow initiation at vallecula;Reduced epiglottic inversion;Reduced airway/laryngeal closure;Penetration/Apiration after swallow;Trace aspiration;Penetration/Aspiration during swallow;Pharyngeal residue - pyriform;Inter-arytenoid space residue    Pharyngeal Material enters airway, passes  BELOW cords without attempt by patient to eject out (silent aspiration)        Pharyngeal - Solids   Pharyngeal- Puree Swallow initiation at vallecula;Reduced epiglottic inversion;Lateral channel residue;Pharyngeal residue - pyriform;Pharyngeal residue - posterior pharnyx    Pharyngeal- Mechanical Soft Pharyngeal residue - pyriform;Swallow initiation at vallecula;Reduced epiglottic inversion;Pharyngeal residue - posterior pharnyx    Pharyngeal- Pill Within functional limits        Electrical Stimulation - Pharyngeal Phase   Was Electrical Stimulation Used No             Cricopharyngeal Phase - 08/23/20 1614           Cervical Esophageal Phase   Cervical Esophageal Phase Within functional limits               SLP Short Term Goals - 08/23/20 1620           SLP SHORT TERM GOAL #1   Title Complete MBSS    Time 2    Period Weeks    Status Achieved   completed 08/23/2020   Target Date 08/25/20        SLP SHORT TERM GOAL #2   Title Pt will verbalize 3+ signs/symptoms of possible aspiration with min cues from SLP and written source as needed.    Baseline Presented this date    Time 2     Period Months    Status On-going    Target Date 10/13/20        SLP SHORT TERM GOAL #3   Title Pt will complete oropharyngeal swallowing exercises x10 each after introduced by SLP and given mi/mod cues for accurate execution.    Baseline Will introduce after MBSS    Time 2    Period Months    Status On-going    Target Date 10/13/20             SLP Long Term Goals - 08/23/20 1621           SLP LONG TERM GOAL #1   Title Pt will demonstrate safe and efficient consumption of self-regulated regular textures and unrestricted liquids with use of compensatory strategies as needed    Baseline Pt consuming soft solids with difficulty, all liquids    Time 3    Period Months    Status On-going             Plan - 08/23/20 1616       Clinical Impression Statement MBSS reveals moderate pharyngeal phase dysphagia characterized by reduced epiglottic deflection with incomplete laryngeal vestibule closure and approximation with posterior pharyngeal wall due to epiglottic mass resulting in penetration during and after swallow and aspiration after the swallow in trace/min amounts with thins (and strongly suspect with secretions) due to residuals in pyriforms/arytenoids. Pt reported not feeling aspiration and did not produce a cough, however he presented with wet vocal quality and throat clearing and aspirate could be seen mixed with secretions bobbing in and out of laryngeal vestibule. Although aspiration was not observed with NTL, residuals were greater in the pyriforms and did not completely clear. Pt with mi/mod pyriform residue with mechanical soft textures which were diminished with sips of liquid and repeat swallows. Barium tablet was swallowed without issue with cup thin, however trace aspiration of thins after the swallow. Head turns Left and Right were attempted and found to be of little benefit (Pt also turned AP). Breath hold was mildly effective on 50% of trials thin when cued to do just  before  swallow and followed by a throat clear/cough and repeat swallow, however some residuals still remained near arytenoids and in pyriforms. Recommend that Pt continue to eat by mouth (mech soft, purees, and thins ok) with excellent oral care and focus on strong cough post swallow and cough/throat clear whenever Pt notices wet vocal quality. Also recommend dysphagia treatment with swallowing exercises throughout the course of his cancer treatment and thereafter to help manage the effects of post radiation sequelae. Pt just had PEG placed yesterday and will follow up with tube feedings per RD. SLP will encourage Pt to attend SLP treatment once per week during his treatment.    Speech Therapy Frequency 1x /week    Duration --   6 weeks   Treatment/Interventions Aspiration precaution training;SLP instruction and feedback;Compensatory strategies;Pharyngeal strengthening exercises;Diet toleration management by SLP;Compensatory techniques;Patient/family education    Potential to Achieve Goals Good    Potential Considerations Other (comment)   chemoradiation sequelae   SLP Home Exercise Plan Pt will completed HEP as assigned to facilitate carryover of treatment strategies and techniques in home environment with use of written cues    Consulted and Agree with Plan of Care Patient;Family member/caregiver    Family Member Consulted Lavone Orn, significant other        Patient will benefit from skilled therapeutic intervention in order to improve the following deficits and impairments:  Dysphagia, oropharyngeal phase        Recommendations/Treatment - 08/23/20 1614           Swallow Evaluation Recommendations   SLP Diet Recommendations Dysphagia 3 (mechanical soft);Thin    Liquid Administration via Cup;No straw    Medication Administration Whole meds with puree    Supervision Patient able to self feed    Compensations Multiple dry swallows after each bite/sip;Clear throat after each swallow    Postural  Changes Seated upright at 90 degrees;Remain upright for at least 30 minutes after feeds/meals             Prognosis - 08/23/20 1614           Prognosis   Prognosis for Safe Diet Advancement Fair    Barriers to Reach Goals Severity of deficits    Barriers/Prognosis Comment radiation sequelae        Individuals Consulted   Consulted and Agree with Results and Recommendations Patient    Report Sent to  Referring physician        Problem List Patient Active Problem List  Diagnosis Date Noted  Squamous cell carcinoma of epiglottis (HCC)   Abdominal pain, epigastric 12/31/2017  Gastric ulcer 08/12/2017  ETOH abuse   Atypical chest pain   Hypercalcemia 10/08/2016  Hypokalemia 10/08/2016  Diarrhea 10/09/2015  Pancreatitis, alcoholic, acute   Alcohol-induced chronic pancreatitis ([place])   Acute pancreatitis   Pancreatitis 12/26/2014  Pancreatic pseudocyst 12/26/2014  Hyponatremia 12/26/2014  HTN (hypertension) 07/27/2013  Abdominal pain 07/11/2012  Macrocytosis 08/17/2011  Marijuana abuse 08/17/2011  Bradycardia 08/17/2011  Alcohol withdrawal ([place]) 08/17/2011  Elevated blood pressure 07/08/2011  Left against medical advice 70/96/2836  Acute alcoholic pancreatitis 62/94/7654  Alcohol abuse, continuous 07/07/2011  Tobacco abuse 07/07/2011  Seizure disorder ([place]) 07/07/2011  Adynamic ileus ([place]) 07/07/2011  Wheezing 07/07/2011  COPD (chronic obstructive pulmonary disease) ([place]) 07/07/2011  Thank you,  Genene Churn, [place]  [place] 08/23/2020, 4:22 PM  [facility] [address] [place], [place], [address] Phone: [phone]   Fax:  [phone]  Name: [name] MRN:  [id number] Date of Birth: [birth year]    Electronically signed by Ephraim Hamburger, CCC-SLP at 08/23/2020 4:36 PM   DG C-Arm 1-60 Min-No Report  Result Date: 08/22/2020 Fluoroscopy was utilized by the requesting physician.  No radiographic  interpretation.     ASSESSMENT:  1.  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 -Ultrasound of the neck on 04/28/2020 with 2.8 x 1.7 cm mass on the right neck and several relatively small hypoechoic lymph nodes in the left side. -Weight loss of 22 pounds in the last 3 months. No fevers or night sweats. -Positive for dysphagia. -PET CT scan on 08/05/2020 shows epiglottis is thickened and irregular with hypermetabolic.  Left level 2 lymph node positive.  Right-sided level 2 nodal metastasis measuring 2.7 x 2.2 cm.  Bilateral areas of peribronchovascular reticulonodular opacities with hypermetabolism favoring infection.  No extra cervical metastatic disease.  Thoracic nodes with low-level of hypermetabolism favored to be reactive. -Fiberoptic exam by Dr. Benjamine Mola on 08/02/2020 showed tongue base mass noted that involved the superior aspect of the epiglottis.  Arytenoid mucosa was edematous with slight erythema.  True vocal cords were pale yellow and edematous but without mass or lesion. -FNA of the right neck lymph node on 08/02/2020 consistent with squamous cell carcinoma.  2. Social/family history: -Worked in the roofing, now on disability. -1 pack/day for 30 years, active smoker. -3 beers per day for the last 10 years.   PLAN:  1.  Base of the tongue/epiglottic squamous cell carcinoma (TX N2 cM0): -He started radiation this morning. -We reviewed labs.  He has hyponatremia secondary to malignancy with sodium 127.  Normal LFTs. -As he is not a candidate for high-dose cisplatin, we will proceed with weekly cisplatin. -We discussed the side effects in detail. -We will give him hydration tomorrow and Wednesday also.  We have encouraged him to drink plenty of fluids. -We will reevaluate him in 1 week.  2. Weight loss: -He is currently taking in two cans of tube feeds. -He is also admitted drinking two 40 ounce beers  daily. -I have asked him to cut back to 1 beer daily.  He was told to increase tube feeds to 5 cans daily.  3.  Right neck pain: -Continue oxycodone 10 mg every 4 hours as needed.  4.  Ongoing EtOH use: -He is continuing to drink 2 cans of 40 ounces beers daily. -I have asked him to cut back to one 40 oz can daily. -I have given him prescription for Librium 25 mg to be taken every 6 hours and titrated up once he completely stops drinking.   Orders placed this encounter:  No orders of the defined types were placed in this encounter.    Derek Jack, MD [place] [phone]   I, Milinda Antis, am acting as a scribe for Dr. Sanda Linger.  I, Derek Jack MD, have reviewed the above documentation for accuracy and completeness, and I agree with the above.

## 2020-09-05 NOTE — Patient Instructions (Signed)
[facility] Discharge Instructions for Patients Receiving Chemotherapy  Today you received the following chemotherapy agents   To help prevent nausea and vomiting after your treatment, we encourage you to take your nausea medication   If you develop nausea and vomiting that is not controlled by your nausea medication, call the clinic.   BELOW ARE SYMPTOMS THAT SHOULD BE REPORTED IMMEDIATELY:  *FEVER GREATER THAN 100.5 F  *CHILLS WITH OR WITHOUT FEVER  NAUSEA AND VOMITING THAT IS NOT CONTROLLED WITH YOUR NAUSEA MEDICATION  *UNUSUAL SHORTNESS OF BREATH  *UNUSUAL BRUISING OR BLEEDING  TENDERNESS IN MOUTH AND THROAT WITH OR WITHOUT PRESENCE OF ULCERS  *URINARY PROBLEMS  *BOWEL PROBLEMS  UNUSUAL RASH Items with * indicate a potential emergency and should be followed up as soon as possible.  Feel free to call the clinic should you have any questions or concerns. The clinic phone number is [phone].  Please show the CHEMO ALERT CARD at check-in to the Emergency Department and triage nurse.   

## 2020-09-06 ENCOUNTER — Inpatient Hospital Stay (HOSPITAL_COMMUNITY): Payer: Medicaid Other

## 2020-09-06 VITALS — BP 132/65 | HR 77 | Temp 98.2°F | Resp 18

## 2020-09-06 DIAGNOSIS — Z5111 Encounter for antineoplastic chemotherapy: Secondary | ICD-10-CM | POA: Diagnosis not present

## 2020-09-06 DIAGNOSIS — C321 Malignant neoplasm of supraglottis: Secondary | ICD-10-CM

## 2020-09-06 MED ORDER — SODIUM CHLORIDE 0.9 % IV SOLN: Freq: Once | INTRAVENOUS | Status: AC

## 2020-09-06 MED ORDER — SODIUM CHLORIDE 0.9% FLUSH
10.0000 mL | Freq: Once | INTRAVENOUS | Status: AC
  Administered 2020-09-06: 10 mL via INTRAVENOUS

## 2020-09-06 MED ORDER — HEPARIN SOD (PORK) LOCK FLUSH 100 UNIT/ML IV SOLN
500.0000 [IU] | Freq: Once | INTRAVENOUS | Status: AC
  Administered 2020-09-06: 500 [IU] via INTRAVENOUS

## 2020-09-06 MED ORDER — SODIUM CHLORIDE 0.9 % IV SOLN
Freq: Once | INTRAVENOUS | Status: AC
  Filled 2020-09-06: qty 10

## 2020-09-06 NOTE — Progress Notes (Signed)
Patient presents today for fluids only. Patient denies any side effects related to treatment. Patient denies nausea, vomiting. Patient states he ate and drank through the night per patient's words. Patient had an episode of diarrhea yesterday after treatment but resolved on it's own with no intervention. Patient denies diarrhea today.   Verbal order received from Dr. Delton Coombes to infuse one liter of house fluids and 556ml bolus of Normal Saline over 30 minutes post house fluids.   House fluids given today per MD orders. Tolerated infusion without adverse affects. Vital signs stable. No complaints at this time. Discharged from clinic ambulatory in stable condition. Alert and oriented x 3. F/U with [facility] as scheduled.

## 2020-09-07 ENCOUNTER — Other Ambulatory Visit: Payer: Self-pay

## 2020-09-07 ENCOUNTER — Inpatient Hospital Stay (HOSPITAL_COMMUNITY): Payer: Medicaid Other

## 2020-09-07 VITALS — BP 112/68 | HR 81 | Temp 98.0°F | Resp 17

## 2020-09-07 DIAGNOSIS — Z5111 Encounter for antineoplastic chemotherapy: Secondary | ICD-10-CM | POA: Diagnosis not present

## 2020-09-07 DIAGNOSIS — C321 Malignant neoplasm of supraglottis: Secondary | ICD-10-CM

## 2020-09-07 MED ORDER — SODIUM CHLORIDE 0.9 % IV SOLN: INTRAVENOUS | Status: DC

## 2020-09-07 MED ORDER — HEPARIN SOD (PORK) LOCK FLUSH 100 UNIT/ML IV SOLN
500.0000 [IU] | Freq: Once | INTRAVENOUS | Status: AC
  Administered 2020-09-07: 500 [IU] via INTRAVENOUS

## 2020-09-07 MED ORDER — SODIUM CHLORIDE 0.9 % IV SOLN
Freq: Once | INTRAVENOUS | Status: AC
  Filled 2020-09-07: qty 10

## 2020-09-07 NOTE — Progress Notes (Signed)
Tolerated infusions w/o adverse effects.  Alert, in no distress.  Discharged via wheelchair in stable condition.

## 2020-09-09 ENCOUNTER — Ambulatory Visit (HOSPITAL_COMMUNITY): Payer: Medicaid Other

## 2020-09-09 ENCOUNTER — Other Ambulatory Visit (HOSPITAL_COMMUNITY): Payer: Self-pay | Admitting: *Deleted

## 2020-09-09 NOTE — Progress Notes (Addendum)
Nutrition Follow-up:  Patient with squamous cell carcinoma of epiglottis.  Patient receiving concurrent chemotherapy and radiation therapy.  PEG placed on 10/25.  Spoke with patient via phone for nutrition follow-up.  Patient reports that he just gave 2 cartons of tube feeding around 12:30 and does not feel bloated.  Has been giving about 2 cartons per day.  Reports that he has cut back on drinking beer.   Reports that he had a bowel movement this am and did not make it to the bathroom.  Says it was not diarrhea and previous BM was several days ago.  No more stool since this am.  Denies nausea.  Reports that he is drinking gingerale and ate some macaroni and cheese but little oral intake.      Medications: reviewed  Labs: reviewed  Anthropometrics:   Weight 107 lb on 11/8 decreased from 109 lb   Estimated Energy Needs  Kcals: 1500-1700 Protein: 75-85 g Fluid: 1.5 L  NUTRITION DIAGNOSIS: Inadequate oral intake continues   INTERVENTION:  Recommend patient to give 5 cartons of tube feeding per day with little oral intake.  Can give 1 carton q 3-4 hours or 2 cartons at 2 feeding and 1 carton at 3rd feeding of the day as long as does not feel bloated, full.  Stressed importance of patient to continue to drink fluids (water, gatorade, gingerale) for hydration.  Patient can continue to eat orally as able in additional to giving prescribed amount of tube feeding.    MONITORING, EVALUATION, GOAL: weight trends, intake, tube feeding   NEXT VISIT: Friday, Nov 19 phone f/u  [name], [place] Registered Dietitian [phone] (mobile)

## 2020-09-10 ENCOUNTER — Emergency Department (HOSPITAL_COMMUNITY)
Admission: EM | Admit: 2020-09-10 | Discharge: 2020-09-10 | Disposition: A | Discharge status: Home / Self Care | Payer: Medicaid Other | Attending: Emergency Medicine | Admitting: Emergency Medicine

## 2020-09-10 ENCOUNTER — Encounter (HOSPITAL_COMMUNITY): Payer: Self-pay | Admitting: *Deleted

## 2020-09-10 ENCOUNTER — Other Ambulatory Visit: Payer: Self-pay

## 2020-09-10 DIAGNOSIS — Z8521 Personal history of malignant neoplasm of larynx: Secondary | ICD-10-CM | POA: Insufficient documentation

## 2020-09-10 DIAGNOSIS — F17210 Nicotine dependence, cigarettes, uncomplicated: Secondary | ICD-10-CM | POA: Diagnosis not present

## 2020-09-10 DIAGNOSIS — Z20822 Contact with and (suspected) exposure to covid-19: Secondary | ICD-10-CM | POA: Diagnosis not present

## 2020-09-10 DIAGNOSIS — Z79899 Other long term (current) drug therapy: Secondary | ICD-10-CM | POA: Insufficient documentation

## 2020-09-10 DIAGNOSIS — I10 Essential (primary) hypertension: Secondary | ICD-10-CM | POA: Diagnosis not present

## 2020-09-10 DIAGNOSIS — Z1152 Encounter for screening for COVID-19: Secondary | ICD-10-CM | POA: Insufficient documentation

## 2020-09-10 DIAGNOSIS — J449 Chronic obstructive pulmonary disease, unspecified: Secondary | ICD-10-CM | POA: Diagnosis not present

## 2020-09-10 HISTORY — DX: Malignant (primary) neoplasm, unspecified: C80.1

## 2020-09-10 LAB — RESPIRATORY PANEL BY RT PCR (FLU A&B, COVID)
Influenza A by PCR: NEGATIVE
Influenza B by PCR: NEGATIVE
SARS Coronavirus 2 by RT PCR: NEGATIVE

## 2020-09-10 NOTE — Discharge Instructions (Addendum)
It was our pleasure to provide your ER care today - we hope that you feel better.  Your covid test will be results in approximately 2 hours - you may check MyChart or call for results.   See covid information.  Please consider getting covid vaccine as soon as able.   Return  to ER if worse, new symptoms, trouble breathing, or other concern.

## 2020-09-10 NOTE — ED Triage Notes (Signed)
Pt is a cancer pt and taking radiation and chemo.  Pt with positive exposure to someone with covid few days ago.

## 2020-09-10 NOTE — ED Provider Notes (Signed)
[facility] EMERGENCY DEPARTMENT Provider Note   CSN: [id number] Arrival date & time: 09/10/20  1612     History Chief Complaint  Patient presents with  . wants covid test    [name] is a 44 y.o. male.  Patient indicates last week was exposed to someone with covid and wants covid test. Patient indicates he has neck ca, and is receiving tx for same, so just wanted to get test. Denies feeling acutely sick or ill. No new symptoms. No cough or sore throat. No chest pain or trouble breathing. No body/muscle aches. No fever or chills.   The history is provided by the patient.       Past Medical History:  Diagnosis Date  . Back pain   . Bradycardia 08/17/2011  . Cancer ([place])   . COPD (chronic obstructive pulmonary disease) ([place])   . ETOH abuse   . GERD (gastroesophageal reflux disease)   . Macrocytosis 08/17/2011  . Pancreatitis   . Pneumonia   . Port-A-Cath in place 09/04/2020  . SAH (subarachnoid hemorrhage) ([place])   . Seizures ([place])    last one 6 mos ago    Patient Active Problem List   Diagnosis Date Noted  . Port-A-Cath in place 09/04/2020  . 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 Abdominal pain, epigastric 12/31/2017  . Gastric ulcer 08/12/2017  . ETOH abuse   . Atypical chest pain   . Hypercalcemia 10/08/2016  . Hypokalemia 10/08/2016  . Diarrhea 10/09/2015  . Pancreatitis, alcoholic, acute   . Alcohol-induced chronic pancreatitis ([place])   . Acute pancreatitis   . Pancreatitis 12/26/2014  . Pancreatic pseudocyst 12/26/2014  . Hyponatremia 12/26/2014  . HTN (hypertension) 07/27/2013  . Abdominal pain 07/11/2012  . Macrocytosis 08/17/2011  . Marijuana abuse 08/17/2011  . Bradycardia 08/17/2011  . Alcohol withdrawal ([place]) 08/17/2011  . Elevated blood pressure 07/08/2011  . Left against medical advice 07/08/2011  . Acute alcoholic pancreatitis 85/11/7739  . Alcohol abuse, continuous 07/07/2011  . Tobacco abuse 07/07/2011  . Seizure  disorder ([place]) 07/07/2011  . Adynamic ileus ([place]) 07/07/2011  . Wheezing 07/07/2011  . COPD (chronic obstructive pulmonary disease) ([place]) 07/07/2011    Past Surgical History:  Procedure Laterality Date  . BACK SURGERY    . ESOPHAGOGASTRODUODENOSCOPY (EGD) WITH PROPOFOL N/A 08/14/2017   Procedure: ESOPHAGOGASTRODUODENOSCOPY (EGD) WITH PROPOFOL;  Surgeon: Daneil Dolin, MD;  Location: [facility] ENDO [facility];  Service: Endoscopy;  Laterality: N/A;  1:30pm  . ESOPHAGOGASTRODUODENOSCOPY (EGD) WITH PROPOFOL N/A 03/13/2018   Procedure: ESOPHAGOGASTRODUODENOSCOPY (EGD) WITH PROPOFOL;  Surgeon: Daneil Dolin, MD;  Location: [facility];  Service: Endoscopy;  Laterality: N/A;  2:30pm  . ESOPHAGOGASTRODUODENOSCOPY (EGD) WITH PROPOFOL N/A 08/22/2020   Procedure: ESOPHAGOGASTRODUODENOSCOPY (EGD) WITH PROPOFOL;  Surgeon: Aviva Signs, MD;  Location: [facility] ORS;  Service: General;  Laterality: N/A;  . PEG PLACEMENT N/A 08/22/2020   Procedure: PERCUTANEOUS ENDOSCOPIC GASTROSTOMY (PEG) PLACEMENT;  Surgeon: Aviva Signs, MD;  Location: [facility] ORS;  Service: General;  Laterality: N/A;  . PORTACATH PLACEMENT Left 08/22/2020   Procedure: INSERTION PORT-A-CATH;  Surgeon: Aviva Signs, MD;  Location: [facility] ORS;  Service: General;  Laterality: Left;  . SEPTOPLASTY         Family History  Problem Relation Age of Onset  . Coronary artery disease Mother        deceased age 33  . Seizures Father   . Alcohol abuse Father   . Colon cancer Neg Hx     Social  History   Tobacco Use  . Smoking status: Current Every Day Smoker    Packs/day: 1.00    Years: 20.00    Pack years: 20.00    Types: Cigarettes  . Smokeless tobacco: Never Used  Vaping Use  . Vaping Use: Never used  Substance Use Topics  . Alcohol use: Yes    Comment: couple of beers daily  . Drug use: Not Currently    Types: Marijuana    Comment: months     Home Medications Prior to Admission medications   Medication Sig Start Date End Date Taking?  Authorizing Provider  albuterol (PROVENTIL HFA;VENTOLIN HFA) 108 (90 BASE) MCG/ACT inhaler Inhale 2 puffs into the lungs every 6 (six) hours as needed for wheezing or shortness of breath.    [provider]  albuterol (PROVENTIL) (2.5 MG/3ML) 0.083% nebulizer solution Take 2.5 mg by nebulization every 6 (six) hours as needed for wheezing or shortness of breath.    [provider]  amLODipine (NORVASC) 10 MG tablet Take 10 mg by mouth daily.  06/22/20   [provider]  carbamazepine (TEGRETOL) 200 MG tablet Take 400 mg by mouth 2 (two) times daily.    [provider]  chlordiazePOXIDE (LIBRIUM) 25 MG capsule Take 1 capsule (25 mg total) by mouth every 6 (six) hours. 09/05/20   Derek Jack, MD  CISPLATIN IV Inject into the vein once a week. 09/05/20   [provider]  diclofenac Sodium (VOLTAREN) 1 % GEL Apply 2 g topically daily as needed (pain).  07/28/20   [provider]  gabapentin (NEURONTIN) 300 MG capsule Take 300 mg by mouth 3 (three) times daily. 07/21/20   [provider]  levETIRAcetam (KEPPRA) 500 MG tablet Take 1,000 mg by mouth 2 (two) times daily.     [provider]  lidocaine-prilocaine (EMLA) cream Apply small amount to port a cath site and cover with plastic wrap 1 hour prior to chemotherapy appointments 09/04/20   Derek Jack, MD  oxyCODONE (OXY IR/ROXICODONE) 5 MG immediate release tablet Take 2 tablets (10 mg total) by mouth every 4 (four) hours as needed for severe pain. 08/25/20   Derek Jack, MD  pantoprazole (PROTONIX) 40 MG tablet Take 1 tablet (40 mg total) by mouth 2 (two) times daily before a meal. 08/12/17   Mahala Menghini, PA-C  prochlorperazine (COMPAZINE) 10 MG tablet Take 1 tablet (10 mg total) by mouth every 6 (six) hours as needed for nausea or vomiting. 08/25/20   Derek Jack, MD    Allergies    Patient has no known allergies.  Review of Systems   Review of  Systems  Constitutional: Negative for fever.  HENT: Negative for sore throat and trouble swallowing.   Respiratory: Negative for cough and shortness of breath.   Cardiovascular: Negative for chest pain.  Musculoskeletal: Negative for myalgias.  Neurological: Negative for headaches.    Physical Exam Updated Vital Signs BP 135/82 (BP Location: Left Arm)   Pulse (!) 114   Temp 98.8 F (37.1 C) (Oral)   Resp (!) 22   Ht 1.702 m (5\' 7" )   Wt 48.5 kg   SpO2 91%   BMI 16.76 kg/m   Physical Exam Vitals and nursing note reviewed.  Constitutional:      Appearance: Normal appearance. He is well-developed.  HENT:     Head: Atraumatic.     Nose: Nose normal.     Mouth/Throat:     Mouth: Mucous membranes are moist.  Eyes:     General: No scleral icterus.    Conjunctiva/sclera: Conjunctivae normal.  Neck:     Trachea: No tracheal deviation.     Comments: Right neck mass.  Cardiovascular:     Rate and Rhythm: Regular rhythm.     Pulses: Normal pulses.  Pulmonary:     Effort: Pulmonary effort is normal. No accessory muscle usage or respiratory distress.     Breath sounds: Normal breath sounds. No stridor.  Abdominal:     General: There is no distension.     Palpations: Abdomen is soft.     Tenderness: There is no abdominal tenderness.  Genitourinary:    Comments: No cva tenderness. Musculoskeletal:        General: No swelling.     Cervical back: Normal range of motion and neck supple.  Skin:    General: Skin is warm and dry.     Findings: No rash.  Neurological:     Mental Status: He is alert.     Comments: Alert, speech clear.   Psychiatric:        Mood and Affect: Mood normal.     ED Results / Procedures / Treatments   Labs (all labs ordered are listed, but only abnormal results are displayed) Labs Reviewed  RESPIRATORY PANEL BY RT PCR (FLU A&B, COVID)    EKG None  Radiology No results found.  Procedures Procedures (including critical care  time)  Medications Ordered in ED Medications - No data to display  ED Course  I have reviewed the triage vital signs and the nursing notes.  Pertinent labs & imaging results that were available during my care of the patient were reviewed by me and considered in my medical decision making (see chart for details).    MDM Rules/Calculators/A&P                         Pt requests covid test.   Reviewed nursing notes and prior charts for additional history.   Pt denies cough, sob, or fever.   COVID swab sent - results pending.   Pt denies new/acute symptoms and appears stable for d/c.   FIRMIN BELISLE was evaluated in Emergency Department on 09/10/2020 for the symptoms described in the history of present illness. He was evaluated in the context of the global COVID-19 pandemic, which necessitated consideration that the patient might be at risk for infection with the SARS-CoV-2 virus that causes COVID-19. Institutional protocols and algorithms that pertain to the evaluation of patients at risk for COVID-19 are in a state of rapid change based on information released by regulatory bodies including the CDC and federal and state organizations. These policies and algorithms were followed during the patient's care in the ED.  Final Clinical Impression(s) / ED Diagnoses Final diagnoses:  None    Rx / DC Orders ED Discharge Orders    None       Lajean Saver, MD 09/10/20 1715

## 2020-09-10 NOTE — ED Notes (Signed)
Pt reports he uis a Ca pt   Found out this morning that a relative has covid   Here requesting a covid test

## 2020-09-12 ENCOUNTER — Inpatient Hospital Stay (HOSPITAL_COMMUNITY): Payer: Medicaid Other

## 2020-09-12 ENCOUNTER — Other Ambulatory Visit: Payer: Self-pay

## 2020-09-12 ENCOUNTER — Inpatient Hospital Stay (HOSPITAL_BASED_OUTPATIENT_CLINIC_OR_DEPARTMENT_OTHER): Payer: Medicaid Other | Admitting: Hematology

## 2020-09-12 VITALS — BP 137/66 | HR 77 | Temp 97.0°F | Resp 20 | Wt 110.8 lb

## 2020-09-12 DIAGNOSIS — Z95828 Presence of other vascular implants and grafts: Secondary | ICD-10-CM

## 2020-09-12 DIAGNOSIS — C321 Malignant neoplasm of supraglottis: Secondary | ICD-10-CM | POA: Diagnosis not present

## 2020-09-12 DIAGNOSIS — Z5111 Encounter for antineoplastic chemotherapy: Secondary | ICD-10-CM | POA: Diagnosis not present

## 2020-09-12 LAB — CBC WITH DIFFERENTIAL/PLATELET
Abs Immature Granulocytes: 0.04 10*3/uL (ref 0.00–0.07)
Basophils Absolute: 0 10*3/uL (ref 0.0–0.1)
Basophils Relative: 0 %
Eosinophils Absolute: 0.1 10*3/uL (ref 0.0–0.5)
Eosinophils Relative: 2 %
HCT: 32.7 % — ABNORMAL LOW (ref 39.0–52.0)
Hemoglobin: 11.2 g/dL — ABNORMAL LOW (ref 13.0–17.0)
Immature Granulocytes: 1 %
Lymphocytes Relative: 23 %
Lymphs Abs: 1.2 10*3/uL (ref 0.7–4.0)
MCH: 33.8 pg (ref 26.0–34.0)
MCHC: 34.3 g/dL (ref 30.0–36.0)
MCV: 98.8 fL (ref 80.0–100.0)
Monocytes Absolute: 1.2 10*3/uL — ABNORMAL HIGH (ref 0.1–1.0)
Monocytes Relative: 22 %
Neutro Abs: 2.7 10*3/uL (ref 1.7–7.7)
Neutrophils Relative %: 52 %
Platelets: 241 10*3/uL (ref 150–400)
RBC: 3.31 MIL/uL — ABNORMAL LOW (ref 4.22–5.81)
RDW: 12.2 % (ref 11.5–15.5)
WBC: 5.2 10*3/uL (ref 4.0–10.5)
nRBC: 0 % (ref 0.0–0.2)

## 2020-09-12 LAB — MAGNESIUM: Magnesium: 1.7 mg/dL (ref 1.7–2.4)

## 2020-09-12 LAB — COMPREHENSIVE METABOLIC PANEL
ALT: 15 U/L (ref 0–44)
AST: 20 U/L (ref 15–41)
Albumin: 3.3 g/dL — ABNORMAL LOW (ref 3.5–5.0)
Alkaline Phosphatase: 70 U/L (ref 38–126)
Anion gap: 9 (ref 5–15)
BUN: 7 mg/dL (ref 6–20)
CO2: 27 mmol/L (ref 22–32)
Calcium: 8.6 mg/dL — ABNORMAL LOW (ref 8.9–10.3)
Chloride: 86 mmol/L — ABNORMAL LOW (ref 98–111)
Creatinine, Ser: 0.33 mg/dL — ABNORMAL LOW (ref 0.61–1.24)
GFR, Estimated: >60 mL/min (ref >60)
Glucose, Bld: 229 mg/dL — ABNORMAL HIGH (ref 70–99)
Potassium: 3.3 mmol/L — ABNORMAL LOW (ref 3.5–5.1)
Sodium: 122 mmol/L — ABNORMAL LOW (ref 135–145)
Total Bilirubin: 0.3 mg/dL (ref 0.3–1.2)
Total Protein: 7.3 g/dL (ref 6.5–8.1)

## 2020-09-12 MED ORDER — SODIUM CHLORIDE 0.9 % IV SOLN
Freq: Once | INTRAVENOUS | Status: AC
  Filled 2020-09-12: qty 10

## 2020-09-12 MED ORDER — SODIUM CHLORIDE 0.9 % IV SOLN
10.0000 mg | Freq: Once | INTRAVENOUS | Status: AC
  Administered 2020-09-12: 10 mg via INTRAVENOUS
  Filled 2020-09-12: qty 10

## 2020-09-12 MED ORDER — SODIUM CHLORIDE 0.9 % IV SOLN: Freq: Once | INTRAVENOUS | Status: AC

## 2020-09-12 MED ORDER — SODIUM CHLORIDE 0.9 % IV SOLN
150.0000 mg | Freq: Once | INTRAVENOUS | Status: AC
  Administered 2020-09-12: 150 mg via INTRAVENOUS
  Filled 2020-09-12: qty 150

## 2020-09-12 MED ORDER — OXYCODONE HCL 5 MG PO TABS
ORAL_TABLET | ORAL | Status: AC
  Filled 2020-09-12: qty 2

## 2020-09-12 MED ORDER — OXYCODONE HCL 5 MG PO TABS
10.0000 mg | ORAL_TABLET | Freq: Once | ORAL | Status: AC
  Administered 2020-09-12: 10 mg via ORAL

## 2020-09-12 MED ORDER — HEPARIN SOD (PORK) LOCK FLUSH 100 UNIT/ML IV SOLN
500.0000 [IU] | Freq: Once | INTRAVENOUS | Status: AC | PRN
  Administered 2020-09-12: 500 [IU]

## 2020-09-12 MED ORDER — SODIUM CHLORIDE 0.9 % IV SOLN
40.0000 mg/m2 | Freq: Once | INTRAVENOUS | Status: AC
  Administered 2020-09-12: 60 mg via INTRAVENOUS
  Filled 2020-09-12: qty 60

## 2020-09-12 MED ORDER — OXYCODONE HCL 10 MG PO TABS: 10.0000 mg | ORAL_TABLET | ORAL | 0 refills | Status: DC | PRN

## 2020-09-12 MED ORDER — PALONOSETRON HCL INJECTION 0.25 MG/5ML
0.2500 mg | Freq: Once | INTRAVENOUS | Status: AC
  Administered 2020-09-12: 0.25 mg via INTRAVENOUS
  Filled 2020-09-12: qty 5

## 2020-09-12 MED ORDER — SODIUM CHLORIDE 0.9% FLUSH
10.0000 mL | INTRAVENOUS | Status: DC | PRN
  Administered 2020-09-12: 10 mL

## 2020-09-12 NOTE — Progress Notes (Signed)
[place], [place] [address]   CLINIC:  Medical Oncology/Hematology  PCP:  Jani Gravel, MD 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      CANCER STAGING: Cancer Staging No matching staging information was found for the patient.  INTERVAL HISTORY:  [name], a 44 y.o. male, returns for routine follow-up and consideration for next cycle of chemotherapy. Demetris was last seen on 09/05/2020.  Due for cycle #2 of cisplatin today.   Today he is accompanied by his sister. Overall, he tells me he has been feeling okay. He tolerated the previous treatment well and he denies N/V/D, though he continues having headaches and sore throat. He is taking oxycodone six times daily, every 4 hours, and he was able to eat a sandwich and a bowl of mac and cheese yesterday. He is putting 3 cans of Osmolyte daily through his PEG tube. He continues drinking 20 ounces of beer daily but reports that his craving for beer is going down; he has not started taking Librium yet. His smoking is also  decreasing.  Overall, he feels ready for next cycle of chemo today.    REVIEW OF SYSTEMS:  Review of Systems  Constitutional: Positive for appetite change (30%) and fatigue (50%).  HENT:   Positive for sore throat.   Genitourinary: Positive for frequency.   Musculoskeletal: Positive for back pain (7/10 back R leg pain).  Neurological: Positive for headaches.  All other systems reviewed and are negative.   PAST MEDICAL/SURGICAL HISTORY:  Past Medical History:  Diagnosis Date  . Back pain   . Bradycardia 08/17/2011  . Cancer ([place])   . COPD (chronic obstructive pulmonary disease) ([facility])   . ETOH abuse   . GERD (gastroesophageal reflux disease)   . Macrocytosis 08/17/2011  . Pancreatitis   . Pneumonia   . Port-A-Cath in place 09/04/2020  . SAH (subarachnoid hemorrhage) ([facility])   . Seizures ([facility])    last one 6 mos ago   Past Surgical History:  Procedure Laterality Date  . BACK SURGERY    . ESOPHAGOGASTRODUODENOSCOPY (EGD) WITH PROPOFOL N/A 08/14/2017   Procedure: ESOPHAGOGASTRODUODENOSCOPY (EGD) WITH PROPOFOL;  Surgeon: Daneil Dolin, MD;  [facility];  Service: Endoscopy;  Laterality: N/A;  1:30pm  . ESOPHAGOGASTRODUODENOSCOPY (EGD) WITH PROPOFOL N/A 03/13/2018   Procedure: ESOPHAGOGASTRODUODENOSCOPY (EGD) WITH PROPOFOL;  Surgeon: Daneil Dolin, MD;  [facility];  Service: Endoscopy;  Laterality: N/A;  2:30pm  . ESOPHAGOGASTRODUODENOSCOPY (EGD) WITH PROPOFOL N/A 08/22/2020   Procedure: ESOPHAGOGASTRODUODENOSCOPY (EGD) WITH PROPOFOL;  Surgeon: Aviva Signs, MD;  Location: [facility];  Service: General;  Laterality: N/A;  . PEG PLACEMENT N/A 08/22/2020   Procedure: PERCUTANEOUS ENDOSCOPIC GASTROSTOMY (PEG) PLACEMENT;  Surgeon: Aviva Signs, MD;  Location: [facility];  Service: General;  Laterality: N/A;  . PORTACATH PLACEMENT Left 08/22/2020   Procedure: INSERTION PORT-A-CATH;  Surgeon: Aviva Signs, MD;  Location: [facility];  Service: General;  Laterality: Left;    . SEPTOPLASTY      SOCIAL HISTORY:  Social History   Socioeconomic History  . Marital status: Single    Spouse name: Not on file  . Number of children: Not on file  . Years of education: Not on file  . Highest education level: Not on file  Occupational History  . Not on file  Tobacco Use  . Smoking status: Current Every Day Smoker    Packs/day: 1.00    Years: 20.00    Pack years: 20.00    Types: Cigarettes  . Smokeless tobacco: Never Used  Vaping Use  . Vaping Use: Never used  Substance and Sexual Activity  . Alcohol use: Yes    Comment: couple of beers daily  . Drug use: Not Currently    Types: Marijuana    Comment: months   . Sexual activity: Not Currently  Other Topics Concern  . Not on file  Social History Narrative  . Not on file   Social Determinants of Health   Financial Resource Strain: Low Risk   . Difficulty of Paying Living Expenses: Not very hard  Food Insecurity: No Food Insecurity  . Worried About Charity fundraiser in the Last Year: Never true  . Ran Out of Food in the Last Year: Never true  Transportation Needs: No Transportation Needs  . Lack of Transportation (Medical): No  . Lack of Transportation (Non-Medical): No  Physical Activity: Sufficiently Active  . Days of Exercise per Week: 7 days  . Minutes of Exercise per Session: 60 min  Stress: Stress Concern Present  . Feeling of Stress : To some extent  Social Connections: Moderately Isolated  . Frequency of Communication with Friends and Family: More than three times a week  . Frequency of Social Gatherings with Friends and Family: More than three times a week  . Attends Religious Services: Never  . Active Member of Clubs or Organizations: No  . Attends Archivist Meetings: Never  . Marital Status: Living with partner  Intimate Partner Violence: Not At Risk  . Fear of Current or Ex-Partner: No  . Emotionally Abused: No  . Physically Abused: No  . Sexually Abused: No     FAMILY HISTORY:  Family History  Problem Relation Age of Onset  . Coronary artery disease Mother        deceased age 43  . Seizures Father   . Alcohol abuse Father   . Colon cancer Neg Hx     CURRENT MEDICATIONS:  Current Outpatient Medications  Medication Sig Dispense Refill  . albuterol (PROVENTIL HFA;VENTOLIN HFA) 108 (90 BASE) MCG/ACT inhaler Inhale 2 puffs into the lungs every 6 (six) hours as needed for wheezing or shortness of breath.    Marland Kitchen albuterol (PROVENTIL) (2.5 MG/3ML) 0.083% nebulizer solution Take 2.5 mg by nebulization every 6 (six) hours as needed for wheezing or shortness of breath.    Marland Kitchen amLODipine (NORVASC) 10 MG tablet Take 10 mg by mouth daily.     . carbamazepine (TEGRETOL) 200 MG tablet Take 400 mg by mouth 2 (two) times daily.    . chlordiazePOXIDE (LIBRIUM) 25 MG capsule Take  1 capsule (25 mg total) by mouth every 6 (six) hours. 30 capsule 0  . CISPLATIN IV Inject into the vein once a week.    . diclofenac Sodium (VOLTAREN) 1 % GEL Apply 2 g topically daily as needed (pain).     Marland Kitchen gabapentin (NEURONTIN) 300 MG capsule Take 300 mg by mouth 3 (three) times daily.    Marland Kitchen levETIRAcetam (KEPPRA) 500 MG tablet Take 1,000 mg by mouth 2 (two) times daily.     Marland Kitchen oxyCODONE (OXY IR/ROXICODONE) 5 MG immediate release tablet Take 2 tablets (10 mg total) by mouth every 4 (four) hours as needed for severe pain. 240 tablet 0  . pantoprazole (PROTONIX) 40 MG tablet Take 1 tablet (40 mg total) by mouth 2 (two) times daily before a meal. 60 tablet 5  . lidocaine-prilocaine (EMLA) cream Apply small amount to port a cath site and cover with plastic wrap 1 hour prior to chemotherapy appointments (Patient not taking: Reported on 09/12/2020) 30 g 3  . prochlorperazine (COMPAZINE) 10 MG tablet Take 1 tablet (10 mg total) by mouth every 6 (six) hours as needed for nausea or vomiting. (Patient not taking: Reported on 09/12/2020) 30 tablet 2   No current facility-administered medications  for this visit.    ALLERGIES:  No Known Allergies  PHYSICAL EXAM:  Performance status (ECOG): 1 - Symptomatic but completely ambulatory  Vitals:   09/12/20 0858  BP: 137/66  Pulse: 77  Resp: 20  Temp: (!) 97 F (36.1 C)  SpO2: 100%   Wt Readings from Last 3 Encounters:  09/12/20 110 lb 12.8 oz (50.3 kg)  09/10/20 107 lb (48.5 kg)  09/05/20 107 lb 1.6 oz (48.6 kg)   Physical Exam Vitals reviewed.  Constitutional:      Appearance: Normal appearance.  Neck:   Cardiovascular:     Rate and Rhythm: Normal rate and regular rhythm.     Pulses: Normal pulses.     Heart sounds: Normal heart sounds.  Pulmonary:     Effort: Pulmonary effort is normal.     Breath sounds: Normal breath sounds.  Chest:     Comments: Port-a-Cath in L chest Abdominal:     Comments: PEG tube  Neurological:     General: No focal deficit present.     Mental Status: He is alert and oriented to person, place, and time.  Psychiatric:        Mood and Affect: Mood normal.        Behavior: Behavior normal.     LABORATORY DATA:  I have reviewed the labs as listed.  CBC Latest Ref Rng & Units 09/12/2020 09/05/2020 08/08/2020  WBC 4.0 - 10.5 K/uL 5.2 12.0(H) 8.7  Hemoglobin 13.0 - 17.0 g/dL 11.2(L) 13.0 14.8  Hematocrit 39 - 52 % 32.7(L) 38.3(L) 42.7  Platelets 150 - 400 K/uL 241 306 171   CMP Latest Ref Rng & Units 09/12/2020 09/05/2020 08/25/2020  Glucose 70 - 99 mg/dL 229(H) 110(H) 78  BUN 6 - 20 mg/dL 7 <5(L) <5(L)  Creatinine 0.61 - 1.24 mg/dL 0.33(L) 0.30(L) <0.30(L)  Sodium 135 - 145 mmol/L 122(L) 127(L) 130(L)  Potassium 3.5 - 5.1 mmol/L 3.3(L) 3.5 3.3(L)  Chloride 98 - 111 mmol/L 86(L) 90(L) 92(L)  CO2 22 - 32 mmol/L _0 Calcium 8.9 - 10.3 mg/dL 8.6(L) 8.9 9.0  Total Protein 6.5 - 8.1 g/dL 7.3 8.2(H) 8.5(H)  Total Bilirubin 0.3 - 1.2 mg/dL 0.3 0.5 0.4  Alkaline Phos 38 - 126 U/L  70 95 83  AST 15 - 41 U/L _0 ALT 0 - 44 U/L _1 DIAGNOSTIC IMAGING:  I have  independently reviewed the scans and discussed with the patient. DG Chest Port 1 View  Result Date: 08/22/2020 CLINICAL DATA:  Port-A-Cath placement EXAM: PORTABLE CHEST 1 VIEW COMPARISON:  August 07, 2017 FINDINGS: Port-A-Cath tip is in the superior vena cava. No pneumothorax. There is fibrosis in the upper lobes bilaterally, more on the left than on the right as well as mild bibasilar atelectasis. There is underlying hyperexpansion. Heart size and pulmonary vascularity are normal. No adenopathy. No bone lesions IMPRESSION: Central catheter tip in superior vena cava. No pneumothorax. Areas of fibrosis in the upper lobes, slightly more on the left than on the right. Mild bibasilar atelectasis. Lungs hyperexpanded. Stable cardiac silhouette. Electronically Signed   By: Lowella Grip III M.D.   On: 08/22/2020 09:35   DG Swallowing Func-Speech Pathology  Result Date: 08/23/2020 [place], [place], [address] Phone: [phone]   Fax:  [phone]  Modified Barium Swallow  Patient Details Name: KYIAN OBST MRN: [id number] Date of Birth: 04-13-1976 No data recorded  Encounter Date: 08/23/2020       End of Session - 08/23/20 1615       Visit Number 1    Number of Visits 6    Date for SLP Re-Evaluation 10/12/20    Authorization Type Medicaid [employer]   27 visits per calendar year, [phone] does not require auth   SLP Start Time 1405    SLP Stop Time  1432    SLP Time Calculation (min) 27 min    Activity Tolerance Patient tolerated treatment well           Past Medical History: Diagnosis Date . Back pain  . Bradycardia 08/17/2011 . COPD (chronic obstructive pulmonary disease) ([facility])  . ETOH abuse  . GERD (gastroesophageal reflux disease)  . Macrocytosis 08/17/2011 . Pancreatitis  . Pneumonia  . SAH (subarachnoid hemorrhage) ([facility])  . Seizures ([facility])   last one 6 mos ago       Past Surgical History: Procedure  Laterality Date . BACK SURGERY   . ESOPHAGOGASTRODUODENOSCOPY (EGD) WITH PROPOFOL N/A 08/14/2017  Procedure: ESOPHAGOGASTRODUODENOSCOPY (EGD) WITH PROPOFOL;  Surgeon: Daneil Dolin, MD;  [facility];  Service: Endoscopy;  Laterality: N/A;  1:30pm . ESOPHAGOGASTRODUODENOSCOPY (EGD) WITH PROPOFOL N/A 03/13/2018  Procedure: ESOPHAGOGASTRODUODENOSCOPY (EGD) WITH PROPOFOL;  Surgeon: Daneil Dolin, MD;  [facility];  Service: Endoscopy;  Laterality: N/A;  2:30pm . ESOPHAGOGASTRODUODENOSCOPY (EGD) WITH PROPOFOL N/A 08/22/2020  Procedure: ESOPHAGOGASTRODUODENOSCOPY (EGD) WITH PROPOFOL;  Surgeon: Aviva Signs, MD;  Location: [facility];  Service: General;  Laterality: N/A; . PEG PLACEMENT N/A 08/22/2020  Procedure: PERCUTANEOUS ENDOSCOPIC GASTROSTOMY (PEG) PLACEMENT;  Surgeon: Aviva Signs, MD;  Location: [facility];  Service: General;  Laterality: N/A; . PORTACATH PLACEMENT Left 08/22/2020  Procedure: INSERTION PORT-A-CATH;  Surgeon: Aviva Signs, MD;  Location: [facility];  Service: General;  Laterality: Left; . SEPTOPLASTY     There were no vitals filed for this visit.       Subjective Assessment - 08/23/20 1607       Subjective "I can swallow ok."    Special Tests MBSS    Currently in Pain? Yes    Pain Score 5     Pain Location Epigastric  General - 08/23/20 1607           General Information   Date of Onset 07/26/20    HPI [name] is a 44 yo male who was referred for a clinical swallow evaluation by Dr. Derek Jack due to recent diagnosis of epiglottic squamous cell carcinoma. Pt had an ultrasound of his right neck for right neck mass 04/28/2020. PET CT scan on 08/05/2020 shows epiglottis is thickened and irregular with hypermetabolic.  Left level 2 lymph node positive.  Right-sided level 2 nodal metastasis measuring 2.7 x 2.2 cm.  Bilateral areas of peribronchovascular reticulonodular opacities with hypermetabolism favoring infection.  No extra  cervical metastatic disease.  Thoracic nodes with low-level of hypermetabolism favored to be reactive. Fiberoptic exam by Dr. Benjamine Mola on 08/02/2020 showed tongue base mass noted that involved the superior aspect of the epiglottis.  Arytenoid mucosa was edematous with slight erythema.  True vocal cords were pale yellow and edematous but without mass or lesion. FNA of the right neck lymph node on 08/02/2020 consistent with squamous cell carcinoma. 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 He is an active smoker (1 pack/day for 30 years) and drinks 3 beers per day for the past 10 years. He will likely undergo chemoradiation therapy. He is scheduled for port placement and PEG tube on 08/22/2020 with Dr. Arnoldo Morale. He reports dysphagia to solid foods and has lost at least 23 pounds in the past few months. He reports that he typically only eats one meal per day. BSE was completed on 08/11/20 with recommendation for MBSS.    Type of Study MBS-Modified Barium Swallow Study    Diet Prior to this Study Regular;Thin liquids    Temperature Spikes Noted No    Respiratory Status Room air    History of Recent Intubation No    Behavior/Cognition Cooperative;Alert    Oral Cavity Assessment Within Functional Limits    Oral Care Completed by SLP No    Oral Cavity - Dentition Edentulous    Vision Functional for self feeding    Self-Feeding Abilities Able to feed self    Patient Positioning Upright in chair    Baseline Vocal Quality Normal;Wet    Volitional Cough Strong    Volitional Swallow Able to elicit    Anatomy Within functional limits   Epiglottis was difficult to visualize   Pharyngeal Secretions Not observed secondary MBS              Oral Preparation/Oral Phase - 08/23/20 1608           Oral Preparation/Oral Phase   Oral Phase Within functional limits        Electrical stimulation - Oral Phase   Was Electrical Stimulation Used No             Pharyngeal Phase - 08/23/20 1609            Pharyngeal Phase   Pharyngeal Phase Impaired        Pharyngeal - Nectar   Pharyngeal- Nectar Cup Swallow initiation at vallecula;Reduced epiglottic inversion;Reduced airway/laryngeal closure;Pharyngeal residue - pyriform        Pharyngeal - Thin   Pharyngeal- Thin Teaspoon Swallow initiation at vallecula;Reduced epiglottic inversion;Reduced airway/laryngeal closure;Penetration/Apiration after swallow;Trace aspiration;Pharyngeal residue - pyriform;Inter-arytenoid space residue    Pharyngeal Material enters airway, passes BELOW cords without attempt by patient to eject out (silent aspiration)    Pharyngeal- Thin Cup Swallow initiation at vallecula;Reduced epiglottic inversion;Reduced airway/laryngeal closure;Penetration/Apiration after swallow;Trace aspiration;Penetration/Aspiration during  swallow;Pharyngeal residue - pyriform;Inter-arytenoid space residue    Pharyngeal Material enters airway, passes BELOW cords without attempt by patient to eject out (silent aspiration)        Pharyngeal - Solids   Pharyngeal- Puree Swallow initiation at vallecula;Reduced epiglottic inversion;Lateral channel residue;Pharyngeal residue - pyriform;Pharyngeal residue - posterior pharnyx    Pharyngeal- Mechanical Soft Pharyngeal residue - pyriform;Swallow initiation at vallecula;Reduced epiglottic inversion;Pharyngeal residue - posterior pharnyx    Pharyngeal- Pill Within functional limits        Electrical Stimulation - Pharyngeal Phase   Was Electrical Stimulation Used No             Cricopharyngeal Phase - 08/23/20 1614           Cervical Esophageal Phase   Cervical Esophageal Phase Within functional limits               SLP Short Term Goals - 08/23/20 1620           SLP SHORT TERM GOAL #1   Title Complete MBSS    Time 2    Period Weeks    Status Achieved   completed 08/23/2020   Target Date 08/25/20        SLP SHORT TERM GOAL #2   Title Pt will verbalize 3+ signs/symptoms of possible  aspiration with min cues from SLP and written source as needed.    Baseline Presented this date    Time 2    Period Months    Status On-going    Target Date 10/13/20        SLP SHORT TERM GOAL #3   Title Pt will complete oropharyngeal swallowing exercises x10 each after introduced by SLP and given mi/mod cues for accurate execution.    Baseline Will introduce after MBSS    Time 2    Period Months    Status On-going    Target Date 10/13/20             SLP Long Term Goals - 08/23/20 1621           SLP LONG TERM GOAL #1   Title Pt will demonstrate safe and efficient consumption of self-regulated regular textures and unrestricted liquids with use of compensatory strategies as needed    Baseline Pt consuming soft solids with difficulty, all liquids    Time 3    Period Months    Status On-going             Plan - 08/23/20 1616       Clinical Impression Statement MBSS reveals moderate pharyngeal phase dysphagia characterized by reduced epiglottic deflection with incomplete laryngeal vestibule closure and approximation with posterior pharyngeal wall due to epiglottic mass resulting in penetration during and after swallow and aspiration after the swallow in trace/min amounts with thins (and strongly suspect with secretions) due to residuals in pyriforms/arytenoids. Pt reported not feeling aspiration and did not produce a cough, however he presented with wet vocal quality and throat clearing and aspirate could be seen mixed with secretions bobbing in and out of laryngeal vestibule. Although aspiration was not observed with NTL, residuals were greater in the pyriforms and did not completely clear. Pt with mi/mod pyriform residue with mechanical soft textures which were diminished with sips of liquid and repeat swallows. Barium tablet was swallowed without issue with cup thin, however trace aspiration of thins after the swallow. Head turns Left and Right were attempted and found to be of little  benefit (Pt also turned AP).  Breath hold was mildly effective on 50% of trials thin when cued to do just before swallow and followed by a throat clear/cough and repeat swallow, however some residuals still remained near arytenoids and in pyriforms. Recommend that Pt continue to eat by mouth (mech soft, purees, and thins ok) with excellent oral care and focus on strong cough post swallow and cough/throat clear whenever Pt notices wet vocal quality. Also recommend dysphagia treatment with swallowing exercises throughout the course of his cancer treatment and thereafter to help manage the effects of post radiation sequelae. Pt just had PEG placed yesterday and will follow up with tube feedings per RD. SLP will encourage Pt to attend SLP treatment once per week during his treatment.    Speech Therapy Frequency 1x /week    Duration --   6 weeks   Treatment/Interventions Aspiration precaution training;SLP instruction and feedback;Compensatory strategies;Pharyngeal strengthening exercises;Diet toleration management by SLP;Compensatory techniques;Patient/family education    Potential to Achieve Goals Good    Potential Considerations Other (comment)   chemoradiation sequelae   SLP Home Exercise Plan Pt will completed HEP as assigned to facilitate carryover of treatment strategies and techniques in home environment with use of written cues    Consulted and Agree with Plan of Care Patient;Family member/caregiver    Family Member Consulted Lavone Orn, significant other        Patient will benefit from skilled therapeutic intervention in order to improve the following deficits and impairments:  Dysphagia, oropharyngeal phase        Recommendations/Treatment - 08/23/20 1614           Swallow Evaluation Recommendations   SLP Diet Recommendations Dysphagia 3 (mechanical soft);Thin    Liquid Administration via Cup;No straw    Medication Administration Whole meds with puree    Supervision Patient able to  self feed    Compensations Multiple dry swallows after each bite/sip;Clear throat after each swallow    Postural Changes Seated upright at 90 degrees;Remain upright for at least 30 minutes after feeds/meals             Prognosis - 08/23/20 1614           Prognosis   Prognosis for Safe Diet Advancement Fair    Barriers to Reach Goals Severity of deficits    Barriers/Prognosis Comment radiation sequelae        Individuals Consulted   Consulted and Agree with Results and Recommendations Patient    Report Sent to  Referring physician        Problem List Patient Active Problem List  Diagnosis Date Noted . Squamous cell carcinoma of epiglottis ([facility])  . Abdominal pain, epigastric 12/31/2017 . Gastric ulcer 08/12/2017 . ETOH abuse  . Atypical chest pain  . Hypercalcemia 10/08/2016 . Hypokalemia 10/08/2016 . Diarrhea 10/09/2015 . Pancreatitis, alcoholic, acute  . Alcohol-induced chronic pancreatitis ([place])  . Acute pancreatitis  . Pancreatitis 12/26/2014 . Pancreatic pseudocyst 12/26/2014 . Hyponatremia 12/26/2014 . HTN (hypertension) 07/27/2013 . Abdominal pain 07/11/2012 . Macrocytosis 08/17/2011 . Marijuana abuse 08/17/2011 . Bradycardia 08/17/2011 . Alcohol withdrawal ([place]) 08/17/2011 . Elevated blood pressure 07/08/2011 . Left against medical advice 07/08/2011 . Acute alcoholic pancreatitis 67/59/1638 . Alcohol abuse, continuous 07/07/2011 . Tobacco abuse 07/07/2011 . Seizure disorder ([place]) 07/07/2011 . Adynamic ileus ([facility]) 07/07/2011 . Wheezing 07/07/2011 . COPD (chronic obstructive pulmonary disease) ([place]) 07/07/2011  Thank you,  Genene Churn, [place]  [place] 08/23/2020, 4:22 PM  [facility] [place], [place],  [place] Phone: [phone]   Fax:  [phone]  Name: GARRELL FLAGG MRN: [id number] Date of Birth: August 12, 1976    Electronically signed by Ephraim Hamburger, CCC-SLP at 08/23/2020 4:36 PM   DG C-Arm  1-60 Min-No Report  Result Date: 08/22/2020 Fluoroscopy was utilized by the requesting physician.  No radiographic interpretation.     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 -Ultrasound of the neck on 04/28/2020 with 2.8 x 1.7 cm mass on the right neck and several relatively small hypoechoic lymph nodes in the left side. -Weight loss of 22 pounds in the last 3 months. No fevers or night sweats. -Positive for dysphagia. -PET CT scan on 08/05/2020 shows epiglottis is thickened and irregular with hypermetabolic. Left level 2 lymph node positive. Right-sided level 2 nodal metastasis measuring 2.7 x 2.2 cm. Bilateral areas of peribronchovascular reticulonodular opacities with hypermetabolism favoring infection. No extra cervical metastatic disease. Thoracic nodes with low-level of hypermetabolism favored to be reactive. -Fiberoptic exam by Dr. Benjamine Mola on 08/02/2020 showed tongue base mass noted that involved the superior aspect of the epiglottis. Arytenoid mucosa was edematous with slight erythema. True vocal cords were pale yellow and edematous but without mass or lesion. -FNA of the right neck lymph node on 08/02/2020 consistent with squamous cell carcinoma.  2. Social/family history: -Worked in the roofing, now on disability. -1 pack/day for 30 years, active smoker. -3 beers per day for the last 10 years.   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  Denied any ringing in the ears or numbness in the extremities. -Reviewed his labs.  Albumin is low at 3.3.  Rest of LFTs are normal.  White count and platelet count is adequate. -Proceed with week #2 of cisplatin.  Hydration today, tomorrow and Wednesday. -RTC 1 week.  2. Weight loss: -He is taking in 3 cans of Osmolite 1.5 daily. -He gained 2  pounds.  Recommend increasing tube feeds to 4 cans/day.  He is able to eat some solid foods starting 2 to 3 days ago.  3. Right neck pain: -Continue oxycodone 10 mg every 4 hours as needed.  We will send refill.  4.  Ongoing EtOH use: -He is currently drinking half a can of 40 ounce beer daily. -I have told him to start taking Librium 25 mg every 6 hours.  5.  Hyponatremia: -His sodium is down to 122.  This is SIADH and treatment related. -Recommend chicken broth through PEG tube and generous salt on foods.   Orders placed this encounter:  No orders of the defined types were placed in this encounter.    Derek Jack, MD [facility] [phone]   I, Milinda Antis, am acting as a scribe for Dr. Sanda Linger.  I, Derek Jack MD, have reviewed the above documentation for accuracy and completeness, and I agree with the above.

## 2020-09-12 NOTE — Patient Instructions (Signed)
[facility] Discharge Instructions for Patients Receiving Chemotherapy  Today you received the following chemotherapy agents   To help prevent nausea and vomiting after your treatment, we encourage you to take your nausea medication   If you develop nausea and vomiting that is not controlled by your nausea medication, call the clinic.   BELOW ARE SYMPTOMS THAT SHOULD BE REPORTED IMMEDIATELY:  *FEVER GREATER THAN 100.5 F  *CHILLS WITH OR WITHOUT FEVER  NAUSEA AND VOMITING THAT IS NOT CONTROLLED WITH YOUR NAUSEA MEDICATION  *UNUSUAL SHORTNESS OF BREATH  *UNUSUAL BRUISING OR BLEEDING  TENDERNESS IN MOUTH AND THROAT WITH OR WITHOUT PRESENCE OF ULCERS  *URINARY PROBLEMS  *BOWEL PROBLEMS  UNUSUAL RASH Items with * indicate a potential emergency and should be followed up as soon as possible.  Feel free to call the clinic should you have any questions or concerns. The clinic phone number is [phone].  Please show the CHEMO ALERT CARD at check-in to the Emergency Department and triage nurse.   

## 2020-09-12 NOTE — Patient Instructions (Signed)
[place] at [facility] Discharge Instructions  You were seen today by Dr. Delton Coombes. He went over your recent results. You received your treatment today. Start taking the chlordiazepoxide daily to prevent the development of DT. Start putting in 4 cans of Osmolyte daily. Dr. Delton Coombes will see you back in 1 week for labs and follow up.   Thank you for choosing [place] at [facility] to provide your oncology and hematology care.  To afford each patient quality time with our provider, please arrive at least 15 minutes before your scheduled appointment time.   If you have a lab appointment with the [facility] please come in thru the Main Entrance and check in at the main information desk  You need to re-schedule your appointment should you arrive 10 or more minutes late.  We strive to give you quality time with our providers, and arriving late affects you and other patients whose appointments are after yours.  Also, if you no show three or more times for appointments you may be dismissed from the clinic at the providers discretion.     Again, thank you for choosing [facility].  Our hope is that these requests will decrease the amount of time that you wait before being seen by our physicians.       _____________________________________________________________  Should you have questions after your visit to [facility], please contact our office at [phone] between the hours of 8:00 a.m. and 4:30 p.m.  Voicemails left after 4:00 p.m. will not be returned until the following business day.  For prescription refill requests, have your pharmacy contact our office and allow 72 hours.    Cancer Center Support Programs:   > Cancer Support Group  2nd Tuesday of the month 1pm-2pm, Journey Room

## 2020-09-12 NOTE — Progress Notes (Signed)
Patient presents today for treatment and follow up visit with Dr. Delton Coombes. Labs pending. Vital signs are within parameters for treatment. MAR reviewed. Patient has complaints of throat pain after radiation therapy. Patient states it is an acute onset. Teaching performed. Patient requests  a refill on pain medication. Patient took Oxy IR/ Roxicodone 5 mg  2 tabs PO this morning at 06:00 am and states he is out of his pain medication. Patient states he was able to eat a meal over the weekend.

## 2020-09-12 NOTE — Progress Notes (Signed)
Patient voided 300 mls.   Treatment given today per MD orders.  Tolerated infusion without adverse affects.  Vital signs stable.  No complaints at this time.  Discharge from clinic ambulatory in stable condition.  Alert and oriented X 3.  Follow up with [facility] as scheduled.

## 2020-09-12 NOTE — Progress Notes (Signed)
Patient was assessed by Dr. Katragadda and labs have been reviewed.  Patient is okay to proceed with treatment today. Primary RN and pharmacy aware.   

## 2020-09-13 ENCOUNTER — Inpatient Hospital Stay (HOSPITAL_COMMUNITY): Payer: Medicaid Other

## 2020-09-13 VITALS — BP 129/81 | HR 90 | Temp 97.5°F | Resp 18

## 2020-09-13 DIAGNOSIS — C321 Malignant neoplasm of supraglottis: Secondary | ICD-10-CM

## 2020-09-13 DIAGNOSIS — Z5111 Encounter for antineoplastic chemotherapy: Secondary | ICD-10-CM | POA: Diagnosis not present

## 2020-09-13 MED ORDER — SODIUM CHLORIDE 0.9 % IV SOLN
Freq: Once | INTRAVENOUS | Status: AC
  Filled 2020-09-13: qty 10

## 2020-09-13 MED ORDER — SODIUM CHLORIDE 0.9% FLUSH
10.0000 mL | Freq: Once | INTRAVENOUS | Status: AC
  Administered 2020-09-13: 10 mL via INTRAVENOUS

## 2020-09-13 MED ORDER — HEPARIN SOD (PORK) LOCK FLUSH 100 UNIT/ML IV SOLN
500.0000 [IU] | Freq: Once | INTRAVENOUS | Status: AC
  Administered 2020-09-13: 500 [IU] via INTRAVENOUS

## 2020-09-13 NOTE — Progress Notes (Signed)
Patient presents today for hydration.  Vital sign within parameters.  No new complaints.  Hydration given today per MD orders.  Tolerated infusion without adverse affects.  Vital signs stable.  No complaints at this time.  Discharge from clinic ambulatory in stable condition.  Alert and oriented X 3.  Follow up with [facility] as scheduled.

## 2020-09-13 NOTE — Patient Instructions (Signed)
[facility] Discharge Instructions for Patients Receiving Chemotherapy  Today you received the following chemotherapy agents   To help prevent nausea and vomiting after your treatment, we encourage you to take your nausea medication   If you develop nausea and vomiting that is not controlled by your nausea medication, call the clinic.   BELOW ARE SYMPTOMS THAT SHOULD BE REPORTED IMMEDIATELY:  *FEVER GREATER THAN 100.5 F  *CHILLS WITH OR WITHOUT FEVER  NAUSEA AND VOMITING THAT IS NOT CONTROLLED WITH YOUR NAUSEA MEDICATION  *UNUSUAL SHORTNESS OF BREATH  *UNUSUAL BRUISING OR BLEEDING  TENDERNESS IN MOUTH AND THROAT WITH OR WITHOUT PRESENCE OF ULCERS  *URINARY PROBLEMS  *BOWEL PROBLEMS  UNUSUAL RASH Items with * indicate a potential emergency and should be followed up as soon as possible.  Feel free to call the clinic should you have any questions or concerns. The clinic phone number is [phone].  Please show the CHEMO ALERT CARD at check-in to the Emergency Department and triage nurse.   

## 2020-09-14 ENCOUNTER — Encounter (HOSPITAL_COMMUNITY): Payer: Self-pay

## 2020-09-14 ENCOUNTER — Inpatient Hospital Stay (HOSPITAL_COMMUNITY): Payer: Medicaid Other

## 2020-09-14 ENCOUNTER — Other Ambulatory Visit: Payer: Self-pay

## 2020-09-14 VITALS — BP 130/76 | HR 88 | Temp 97.4°F | Resp 18

## 2020-09-14 DIAGNOSIS — C321 Malignant neoplasm of supraglottis: Secondary | ICD-10-CM

## 2020-09-14 DIAGNOSIS — Z5111 Encounter for antineoplastic chemotherapy: Secondary | ICD-10-CM | POA: Diagnosis not present

## 2020-09-14 MED ORDER — SODIUM CHLORIDE 0.9% FLUSH
10.0000 mL | INTRAVENOUS | Status: DC | PRN
  Administered 2020-09-14: 10 mL via INTRAVENOUS

## 2020-09-14 MED ORDER — HEPARIN SOD (PORK) LOCK FLUSH 100 UNIT/ML IV SOLN
500.0000 [IU] | Freq: Once | INTRAVENOUS | Status: AC
  Administered 2020-09-14: 500 [IU] via INTRAVENOUS

## 2020-09-14 MED ORDER — SODIUM CHLORIDE 0.9 % IV SOLN
Freq: Once | INTRAVENOUS | Status: AC
  Filled 2020-09-14: qty 10

## 2020-09-14 NOTE — Patient Instructions (Signed)
[facility] at [facility] °Discharge Instructions ° °Received IV hydration with magnesium and potassium today. Follow-up as scheduled ° ° °Thank you for choosing [facility] at [facility] to provide your oncology and hematology care.  To afford each patient quality time with our provider, please arrive at least 15 minutes before your scheduled appointment time.  ° °If you have a lab appointment with the Cancer Center please come in thru the Main Entrance and check in at the main information desk. ° °You need to re-schedule your appointment should you arrive 10 or more minutes late.  We strive to give you quality time with our providers, and arriving late affects you and other patients whose appointments are after yours.  Also, if you no show three or more times for appointments you may be dismissed from the clinic at the providers discretion.     °Again, thank you for choosing [facility].  Our hope is that these requests will decrease the amount of time that you wait before being seen by our physicians.       °_____________________________________________________________ ° °Should you have questions after your visit to [facility], please contact our office at [phone] and follow the prompts.  Our office hours are 8:00 a.m. and 4:30 p.m. Monday - Friday.  Please note that voicemails left after 4:00 p.m. may not be returned until the following business day.  We are closed weekends and major holidays.  You do have access to a nurse 24-7, just call the main number to the clinic [phone] and do not press any options, hold on the line and a nurse will answer the phone.   ° °For prescription refill requests, have your pharmacy contact our office and allow 72 hours.   ° °Due to Covid, you will need to wear a mask upon entering the hospital. If you do not have a mask, a mask will be given to you at the Main Entrance upon arrival. For doctor  visits, patients may have 1 support person age 18 or older with them. For treatment visits, patients can not have anyone with them due to social distancing guidelines and our immunocompromised population.  ° ° ° °

## 2020-09-14 NOTE — Progress Notes (Signed)
[name] tolerated IV hydration with magnesium and potassium well without complaints or incident. VSS upon discharge. Pt discharged via wheelchair in satisfactory condition

## 2020-09-16 ENCOUNTER — Encounter (HOSPITAL_COMMUNITY): Payer: Medicaid Other

## 2020-09-16 ENCOUNTER — Telehealth (HOSPITAL_COMMUNITY): Payer: Self-pay

## 2020-09-16 NOTE — Telephone Encounter (Signed)
Nutrition  Unable to reach patient by phone for nutrition follow-up today.    Dev Dhondt B. Zenia Resides, [place], [place] Registered Dietitian [phone] (mobile)

## 2020-09-19 ENCOUNTER — Inpatient Hospital Stay (HOSPITAL_COMMUNITY): Payer: Medicaid Other

## 2020-09-19 ENCOUNTER — Other Ambulatory Visit: Payer: Self-pay

## 2020-09-19 ENCOUNTER — Encounter (HOSPITAL_COMMUNITY): Payer: Self-pay | Admitting: Hematology

## 2020-09-19 ENCOUNTER — Inpatient Hospital Stay (HOSPITAL_BASED_OUTPATIENT_CLINIC_OR_DEPARTMENT_OTHER): Payer: Medicaid Other | Admitting: Hematology

## 2020-09-19 VITALS — BP 117/53 | HR 82 | Temp 98.7°F | Resp 18

## 2020-09-19 VITALS — BP 133/72 | HR 85 | Temp 97.5°F | Resp 18 | Wt 102.2 lb

## 2020-09-19 DIAGNOSIS — C321 Malignant neoplasm of supraglottis: Secondary | ICD-10-CM | POA: Diagnosis not present

## 2020-09-19 DIAGNOSIS — Z95828 Presence of other vascular implants and grafts: Secondary | ICD-10-CM

## 2020-09-19 DIAGNOSIS — Z5111 Encounter for antineoplastic chemotherapy: Secondary | ICD-10-CM | POA: Diagnosis not present

## 2020-09-19 LAB — MAGNESIUM: Magnesium: 1.9 mg/dL (ref 1.7–2.4)

## 2020-09-19 LAB — COMPREHENSIVE METABOLIC PANEL
ALT: 18 U/L (ref 0–44)
AST: 20 U/L (ref 15–41)
Albumin: 3.6 g/dL (ref 3.5–5.0)
Alkaline Phosphatase: 75 U/L (ref 38–126)
Anion gap: 11 (ref 5–15)
BUN: <5 mg/dL — ABNORMAL LOW (ref 6–20)
CO2: 26 mmol/L (ref 22–32)
Calcium: 9.1 mg/dL (ref 8.9–10.3)
Chloride: 89 mmol/L — ABNORMAL LOW (ref 98–111)
Creatinine, Ser: <0.3 mg/dL — ABNORMAL LOW (ref 0.61–1.24)
Glucose, Bld: 109 mg/dL — ABNORMAL HIGH (ref 70–99)
Potassium: 3.6 mmol/L (ref 3.5–5.1)
Sodium: 126 mmol/L — ABNORMAL LOW (ref 135–145)
Total Bilirubin: 0.1 mg/dL — ABNORMAL LOW (ref 0.3–1.2)
Total Protein: 7.7 g/dL (ref 6.5–8.1)

## 2020-09-19 LAB — CBC WITH DIFFERENTIAL/PLATELET
Abs Immature Granulocytes: 0.01 10*3/uL (ref 0.00–0.07)
Basophils Absolute: 0.1 10*3/uL (ref 0.0–0.1)
Basophils Relative: 1 %
Eosinophils Absolute: 0.1 10*3/uL (ref 0.0–0.5)
Eosinophils Relative: 3 %
HCT: 34.7 % — ABNORMAL LOW (ref 39.0–52.0)
Hemoglobin: 11.8 g/dL — ABNORMAL LOW (ref 13.0–17.0)
Immature Granulocytes: 0 %
Lymphocytes Relative: 23 %
Lymphs Abs: 1.2 10*3/uL (ref 0.7–4.0)
MCH: 33.9 pg (ref 26.0–34.0)
MCHC: 34 g/dL (ref 30.0–36.0)
MCV: 99.7 fL (ref 80.0–100.0)
Monocytes Absolute: 1 10*3/uL (ref 0.1–1.0)
Monocytes Relative: 20 %
Neutro Abs: 2.8 10*3/uL (ref 1.7–7.7)
Neutrophils Relative %: 53 %
Platelets: 281 10*3/uL (ref 150–400)
RBC: 3.48 MIL/uL — ABNORMAL LOW (ref 4.22–5.81)
RDW: 12.3 % (ref 11.5–15.5)
WBC: 5.2 10*3/uL (ref 4.0–10.5)
nRBC: 0 % (ref 0.0–0.2)

## 2020-09-19 MED ORDER — HEPARIN SOD (PORK) LOCK FLUSH 100 UNIT/ML IV SOLN
500.0000 [IU] | Freq: Once | INTRAVENOUS | Status: AC | PRN
  Administered 2020-09-19: 500 [IU]

## 2020-09-19 MED ORDER — SODIUM CHLORIDE 0.9% FLUSH
10.0000 mL | INTRAVENOUS | Status: DC | PRN
  Administered 2020-09-19: 10 mL

## 2020-09-19 MED ORDER — SODIUM CHLORIDE 0.9 % IV SOLN
Freq: Once | INTRAVENOUS | Status: AC
  Filled 2020-09-19: qty 10

## 2020-09-19 MED ORDER — OXYCODONE HCL 5 MG PO TABS
10.0000 mg | ORAL_TABLET | Freq: Once | ORAL | Status: AC
  Administered 2020-09-19: 10 mg via ORAL
  Filled 2020-09-19: qty 2

## 2020-09-19 MED ORDER — SODIUM CHLORIDE 0.9 % IV SOLN
10.0000 mg | Freq: Once | INTRAVENOUS | Status: AC
  Administered 2020-09-19: 10 mg via INTRAVENOUS
  Filled 2020-09-19: qty 10

## 2020-09-19 MED ORDER — SODIUM CHLORIDE 0.9 % IV SOLN: Freq: Once | INTRAVENOUS | Status: AC

## 2020-09-19 MED ORDER — SODIUM CHLORIDE 0.9 % IV SOLN
40.0000 mg/m2 | Freq: Once | INTRAVENOUS | Status: AC
  Administered 2020-09-19: 60 mg via INTRAVENOUS
  Filled 2020-09-19: qty 60

## 2020-09-19 MED ORDER — PALONOSETRON HCL INJECTION 0.25 MG/5ML
0.2500 mg | Freq: Once | INTRAVENOUS | Status: AC
  Administered 2020-09-19: 0.25 mg via INTRAVENOUS
  Filled 2020-09-19: qty 5

## 2020-09-19 MED ORDER — SODIUM CHLORIDE 0.9 % IV SOLN
150.0000 mg | Freq: Once | INTRAVENOUS | Status: AC
  Administered 2020-09-19: 150 mg via INTRAVENOUS
  Filled 2020-09-19: qty 150

## 2020-09-19 NOTE — Patient Instructions (Signed)
[place] at [facility] Discharge Instructions  You were seen today by Dr. Delton Coombes. He went over your recent results. You received your treatment today. Take at least 4 cans of Osmolite daily and spread them out to prevent abdominal distention and pain; drink chicken broth to maintain your electrolyte levels. Dr. Delton Coombes will see you back in 1 week for labs and follow up.   Thank you for choosing [place] at [facility] to provide your oncology and hematology care.  To afford each patient quality time with our provider, please arrive at least 15 minutes before your scheduled appointment time.   If you have a lab appointment with the [facility] please come in thru the Main Entrance and check in at the main information desk  You need to re-schedule your appointment should you arrive 10 or more minutes late.  We strive to give you quality time with our providers, and arriving late affects you and other patients whose appointments are after yours.  Also, if you no show three or more times for appointments you may be dismissed from the clinic at the providers discretion.     Again, thank you for choosing [facility].  Our hope is that these requests will decrease the amount of time that you wait before being seen by our physicians.       _____________________________________________________________  Should you have questions after your visit to [facility], please contact our office at [phone] between the hours of 8:00 a.m. and 4:30 p.m.  Voicemails left after 4:00 p.m. will not be returned until the following business day.  For prescription refill requests, have your pharmacy contact our office and allow 72 hours.    Cancer Center Support Programs:   > Cancer Support Group  2nd Tuesday of the month 1pm-2pm, Journey Room

## 2020-09-19 NOTE — Patient Instructions (Signed)
[facility] Discharge Instructions for Patients Receiving Chemotherapy   Beginning January 23rd 2017 lab work for the [facility] will be done in the  Main lab at [facility] on 1st floor. If you have a lab appointment with the [facility] please come in thru the  Main Entrance and check in at the main information desk   Today you received the following chemotherapy agents Cisplatin. Follow-up as scheduled  To help prevent nausea and vomiting after your treatment, we encourage you to take your nausea medication   If you develop nausea and vomiting, or diarrhea that is not controlled by your medication, call the clinic.  The clinic phone number is [phone]. Office hours are Monday-Friday 8:30am-5:00pm.  BELOW ARE SYMPTOMS THAT SHOULD BE REPORTED IMMEDIATELY:  *FEVER GREATER THAN 101.0 F  *CHILLS WITH OR WITHOUT FEVER  NAUSEA AND VOMITING THAT IS NOT CONTROLLED WITH YOUR NAUSEA MEDICATION  *UNUSUAL SHORTNESS OF BREATH  *UNUSUAL BRUISING OR BLEEDING  TENDERNESS IN MOUTH AND THROAT WITH OR WITHOUT PRESENCE OF ULCERS  *URINARY PROBLEMS  *BOWEL PROBLEMS  UNUSUAL RASH Items with * indicate a potential emergency and should be followed up as soon as possible. If you have an emergency after office hours please contact your primary care physician or go to the nearest emergency department.  Please call the clinic during office hours if you have any questions or concerns.   You may also contact the Patient Navigator at [phone] should you have any questions or need assistance in obtaining follow up care.      Resources For Cancer Patients and their Caregivers ? American Cancer Society: Can assist with transportation, wigs, general needs, runs Look Good Feel Better.        (715) 180-5367 ? Cancer Care: Provides financial assistance, online support groups, medication/co-pay assistance.  1-800-813-HOPE 8318172558) ? [facility] Assists [place] cancer patients and their families through emotional , educational and financial support.  720-446-2653 ? [place] DSS Where to apply for food stamps, Medicaid and utility assistance. (801) 568-3885 ? [employer]: Transportation to medical appointments. 8322770388 ? Social Security Administration: May apply for disability if have a Stage IV cancer. 731 674 2004 [phone] ? [employer], Disability and Transit Services: Assists with nutrition, care and transit needs. 854-384-5112

## 2020-09-19 NOTE — Progress Notes (Signed)
1030 Labs reviewed with and pt seen by Dr. Delton Coombes and pt approved for Cisplatin infusion today per MD                                 Warnell Bureau Nielson tolerated Cisplatin infusion well without complaints or incident. Portacath saline locked and flushed for use tomorrow. VSS upon discharge. Pt discharged self ambulatory in satisfactory condition

## 2020-09-19 NOTE — Progress Notes (Signed)
Patient was assessed by Dr. Katragadda and labs have been reviewed.  Patient is okay to proceed with treatment today. Primary RN and pharmacy aware.   

## 2020-09-19 NOTE — Patient Instructions (Signed)
[facility] at [facility] Discharge Instructions  Labs drawn from portacath today   Thank you for choosing [facility] at [facility] to provide your oncology and hematology care.  To afford each patient quality time with our provider, please arrive at least 15 minutes before your scheduled appointment time.   If you have a lab appointment with the Cancer Center please come in thru the Main Entrance and check in at the main information desk.  You need to re-schedule your appointment should you arrive 10 or more minutes late.  We strive to give you quality time with our providers, and arriving late affects you and other patients whose appointments are after yours.  Also, if you no show three or more times for appointments you may be dismissed from the clinic at the providers discretion.     Again, thank you for choosing [facility].  Our hope is that these requests will decrease the amount of time that you wait before being seen by our physicians.       _____________________________________________________________  Should you have questions after your visit to [facility], please contact our office at [phone] and follow the prompts.  Our office hours are 8:00 a.m. and 4:30 p.m. Monday - Friday.  Please note that voicemails left after 4:00 p.m. may not be returned until the following business day.  We are closed weekends and major holidays.  You do have access to a nurse 24-7, just call the main number to the clinic [phone] and do not press any options, hold on the line and a nurse will answer the phone.    For prescription refill requests, have your pharmacy contact our office and allow 72 hours.    Due to Covid, you will need to wear a mask upon entering the hospital. If you do not have a mask, a mask will be given to you at the Main Entrance upon arrival. For doctor visits, patients may have 1 support person age 18  or older with them. For treatment visits, patients can not have anyone with them due to social distancing guidelines and our immunocompromised population.     

## 2020-09-19 NOTE — Progress Notes (Signed)
[place], [place] [address]   CLINIC:  Medical Oncology/Hematology  PCP:  Jani Gravel, MD 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      CANCER STAGING: Cancer Staging No matching staging information was found for the patient.  INTERVAL HISTORY:  [name], a 44 y.o. male, returns for routine follow-up and consideration for next cycle of chemotherapy. [name] was last seen on 09/12/2020.  Due for cycle #3 of cisplatin and Aloxi today.   Today he is accompanied by his sister. Overall, he tells me he has been feeling poorly. He reports putting in 2 cans of Osmolite yesterday and 4 cans a couple days prior but complained of abdominal distention and pain. He put in 1 can of Osmolite this AM and he tolerates 1 can at a time well. He continues drinking 20 ounces of beer daily and takes Librium daily. He continues complaining of neck pain on his right side and lower back and he takes oxycodone 10 mg every 4 hours. He  denies having tinnitus or N/V.  Overall, he feels ready for next cycle of chemo today.    REVIEW OF SYSTEMS:  Review of Systems  Constitutional: Positive for appetite change (depleted) and fatigue (depleted).  HENT:   Positive for trouble swallowing (w/ pills). Negative for tinnitus.   Respiratory: Positive for cough (w/ white sputum).   Gastrointestinal: Positive for diarrhea. Negative for nausea and vomiting.  Musculoskeletal: Positive for neck pain (8/10 R neck and lower back pain).  Neurological: Positive for headaches.  Psychiatric/Behavioral: Positive for depression and sleep disturbance. The patient is nervous/anxious.   All other systems reviewed and are negative.   PAST MEDICAL/SURGICAL HISTORY:  Past Medical History:  Diagnosis Date  . Back pain   . Bradycardia 08/17/2011  . Cancer ([place])   . COPD (chronic obstructive pulmonary disease) ([place])   . ETOH abuse   . GERD (gastroesophageal reflux disease)   . Macrocytosis 08/17/2011  . Pancreatitis   . Pneumonia   . Port-A-Cath in place 09/04/2020  . SAH (subarachnoid hemorrhage) ([place])   . Seizures ([place])    last one 6 mos ago   Past Surgical History:  Procedure Laterality Date  . BACK SURGERY    . ESOPHAGOGASTRODUODENOSCOPY (EGD) WITH PROPOFOL N/A 08/14/2017   Procedure: ESOPHAGOGASTRODUODENOSCOPY (EGD) WITH PROPOFOL;  Surgeon: Daneil Dolin, MD;  [facility];  Service: Endoscopy;  Laterality: N/A;  1:30pm  . ESOPHAGOGASTRODUODENOSCOPY (EGD) WITH PROPOFOL N/A 03/13/2018   Procedure: ESOPHAGOGASTRODUODENOSCOPY (EGD) WITH PROPOFOL;  Surgeon: Daneil Dolin, MD;  [facility];  Service: Endoscopy;  Laterality: N/A;  2:30pm  . ESOPHAGOGASTRODUODENOSCOPY (EGD) WITH PROPOFOL N/A 08/22/2020   Procedure: ESOPHAGOGASTRODUODENOSCOPY (EGD) WITH PROPOFOL;  Surgeon: Aviva Signs, MD;  Location: [facility] OR[facility];  Service: General;  Laterality: N/A;  . PEG PLACEMENT N/A 08/22/2020   Procedure: PERCUTANEOUS ENDOSCOPIC  GASTROSTOMY (PEG) PLACEMENT;  Surgeon: Aviva Signs, MD;  Location: [facility];  Service: General;  Laterality: N/A;  . PORTACATH PLACEMENT Left 08/22/2020   Procedure: INSERTION PORT-A-CATH;  Surgeon: Aviva Signs, MD;  Location: [facility];  Service: General;  Laterality: Left;  . SEPTOPLASTY      SOCIAL HISTORY:  Social History   Socioeconomic History  . Marital status: Single    Spouse name: Not on file  . Number of children: Not on file  . Years of education: Not on file  . Highest education level: Not on file  Occupational History  . Not on file  Tobacco Use  . Smoking status: Current Every Day Smoker    Packs/day: 1.00    Years: 20.00    Pack years: 20.00    Types: Cigarettes  . Smokeless tobacco: Never Used  Vaping Use  . Vaping Use: Never used  Substance and Sexual Activity  . Alcohol use: Yes    Comment: couple of beers daily  . Drug use: Not Currently    Types: Marijuana    Comment: months   . Sexual activity: Not Currently  Other Topics Concern  . Not on file  Social History Narrative  . Not on file   Social Determinants of Health   Financial Resource Strain: Low Risk   . Difficulty of Paying Living Expenses: Not very hard  Food Insecurity: No Food Insecurity  . Worried About Charity fundraiser in the Last Year: Never true  . Ran Out of Food in the Last Year: Never true  Transportation Needs: No Transportation Needs  . Lack of Transportation (Medical): No  . Lack of Transportation (Non-Medical): No  Physical Activity: Sufficiently Active  . Days of Exercise per Week: 7 days  . Minutes of Exercise per Session: 60 min  Stress: Stress Concern Present  . Feeling of Stress : To some extent  Social Connections: Moderately Isolated  . Frequency of Communication with Friends and Family: More than three times a week  . Frequency of Social Gatherings with Friends and Family: More than three times a week  . Attends Religious Services: Never  . Active Member of  Clubs or Organizations: No  . Attends Archivist Meetings: Never  . Marital Status: Living with partner  Intimate Partner Violence: Not At Risk  . Fear of Current or Ex-Partner: No  . Emotionally Abused: No  . Physically Abused: No  . Sexually Abused: No    FAMILY HISTORY:  Family History  Problem Relation Age of Onset  . Coronary artery disease Mother        deceased age 43  . Seizures Father   . Alcohol abuse Father   . Colon cancer Neg Hx     CURRENT MEDICATIONS:  Current Outpatient Medications  Medication Sig Dispense Refill  . diphenhydrAMINE (BENADRYL) 12.5 MG/5ML elixir Take 10 mLs by mouth in the morning, at noon, in the evening, and at bedtime.    Marland Kitchen albuterol (PROVENTIL HFA;VENTOLIN HFA) 108 (90 BASE) MCG/ACT inhaler Inhale 2 puffs into the lungs every 6 (six) hours as needed for wheezing or shortness of breath.    Marland Kitchen albuterol (PROVENTIL) (2.5 MG/3ML)  0.083% nebulizer solution Take 2.5 mg by nebulization every 6 (six) hours as needed for wheezing or shortness of breath.    Marland Kitchen amLODipine (NORVASC) 10 MG tablet Take 10 mg by mouth daily.     . carbamazepine (TEGRETOL) 200 MG tablet Take 400 mg by mouth 2 (two) times daily.    . chlordiazePOXIDE (LIBRIUM) 25 MG capsule Take 1 capsule (25 mg total) by mouth every 6 (six) hours. 30 capsule 0  . CISPLATIN IV Inject into the vein once a week.    Marland Kitchen dexamethasone 0.5 MG/5ML elixir Take by mouth.    . diclofenac Sodium (VOLTAREN) 1 % GEL Apply 2 g topically daily as needed (pain).     Marland Kitchen gabapentin (NEURONTIN) 300 MG capsule Take 300 mg by mouth 3 (three) times daily.    Marland Kitchen levETIRAcetam (KEPPRA) 500 MG tablet Take 1,000 mg by mouth 2 (two) times daily.     Marland Kitchen lidocaine-prilocaine (EMLA) cream Apply small amount to port a cath site and cover with plastic wrap 1 hour prior to chemotherapy appointments 30 g 3  . oxyCODONE 10 MG TABS Take 1 tablet (10 mg total) by mouth every 4 (four) hours as needed for severe pain. 180 tablet 0   . pantoprazole (PROTONIX) 40 MG tablet Take 1 tablet (40 mg total) by mouth 2 (two) times daily before a meal. 60 tablet 5  . prochlorperazine (COMPAZINE) 10 MG tablet Take 1 tablet (10 mg total) by mouth every 6 (six) hours as needed for nausea or vomiting. 30 tablet 2   No current facility-administered medications for this visit.    ALLERGIES:  No Known Allergies  PHYSICAL EXAM:  Performance status (ECOG): 1 - Symptomatic but completely ambulatory  Vitals:   09/19/20 0911  BP: 133/72  Pulse: 85  Resp: 18  Temp: (!) 97.5 F (36.4 C)  SpO2: 100%   Wt Readings from Last 3 Encounters:  09/19/20 102 lb 3.2 oz (46.4 kg)  09/12/20 110 lb 12.8 oz (50.3 kg)  09/10/20 107 lb (48.5 kg)   Physical Exam Vitals reviewed.  Constitutional:      Appearance: Normal appearance.  Cardiovascular:     Rate and Rhythm: Normal rate and regular rhythm.     Pulses: Normal pulses.     Heart sounds: Normal heart sounds.  Pulmonary:     Effort: Pulmonary effort is normal.     Breath sounds: Normal breath sounds.  Chest:     Comments: Port-a-Cath in L chest Abdominal:     Palpations: Abdomen is soft.     Tenderness: There is no abdominal tenderness.     Comments: PEG tube  Musculoskeletal:     Right lower leg: No edema.     Left lower leg: No edema.  Neurological:     General: No focal deficit present.     Mental Status: He is alert and oriented to person, place, and time.  Psychiatric:        Mood and Affect: Mood normal.        Behavior: Behavior normal.     LABORATORY DATA:  I have reviewed the labs as listed.  CBC Latest Ref Rng & Units 09/19/2020 09/12/2020 09/05/2020  WBC 4.0 - 10.5 K/uL 5.2 5.2 12.0(H)  Hemoglobin 13.0 - 17.0 g/dL 11.8(L) 11.2(L) 13.0  Hematocrit 39 - 52 % 34.7(L) 32.7(L) 38.3(L)  Platelets 150 - 400 K/uL 281 241 306   CMP Latest Ref Rng & Units 09/19/2020 09/12/2020 09/05/2020  Glucose 70 - 99  mg/dL 109(H) 229(H) 110(H)  BUN 6 - 20 mg/dL <5(L) 7 <5(L)    Creatinine 0.61 - 1.24 mg/dL <0.30(L) 0.33(L) 0.30(L)  Sodium 135 - 145 mmol/L 126(L) 122(L) 127(L)  Potassium 3.5 - 5.1 mmol/L 3.6 3.3(L) 3.5  Chloride 98 - 111 mmol/L 89(L) 86(L) 90(L)  CO2 22 - 32 mmol/L 26 27 27   Calcium 8.9 - 10.3 mg/dL 9.1 8.6(L) 8.9  Total Protein 6.5 - 8.1 g/dL 7.7 7.3 8.2(H)  Total Bilirubin 0.3 - 1.2 mg/dL 0.1(L) 0.3 0.5  Alkaline Phos 38 - 126 U/L 75 70 95  AST 15 - 41 U/L 20 20 21   ALT 0 - 44 U/L 18 15 13     DIAGNOSTIC IMAGING:  I have independently reviewed the scans and discussed with the patient. DG Chest Port 1 View  Result Date: 08/22/2020 CLINICAL DATA:  Port-A-Cath placement EXAM: PORTABLE CHEST 1 VIEW COMPARISON:  August 07, 2017 FINDINGS: Port-A-Cath tip is in the superior vena cava. No pneumothorax. There is fibrosis in the upper lobes bilaterally, more on the left than on the right as well as mild bibasilar atelectasis. There is underlying hyperexpansion. Heart size and pulmonary vascularity are normal. No adenopathy. No bone lesions IMPRESSION: Central catheter tip in superior vena cava. No pneumothorax. Areas of fibrosis in the upper lobes, slightly more on the left than on the right. Mild bibasilar atelectasis. Lungs hyperexpanded. Stable cardiac silhouette. Electronically Signed   By: Lowella Grip III M.D.   On: 08/22/2020 09:35   DG Swallowing Func-Speech Pathology  Result Date: 08/23/2020 [place], [place], [address] Phone: [phone]   Fax:  [phone]  Modified Barium Swallow  Patient Details Name: [name] MRN: [id number] Date of Birth: 23-Sep-1976 No data recorded  Encounter Date: 08/23/2020       End of Session - 08/23/20 1615       Visit Number 1    Number of Visits 6    Date for SLP Re-Evaluation 10/12/20    Authorization Type Medicaid [employer]   27 visits per calendar year, [id number] does not require auth   SLP Start Time 1405    SLP Stop Time  1432     SLP Time Calculation (min) 27 min    Activity Tolerance Patient tolerated treatment well           Past Medical History: Diagnosis Date . Back pain  . Bradycardia 08/17/2011 . COPD (chronic obstructive pulmonary disease) ([place])  . ETOH abuse  . GERD (gastroesophageal reflux disease)  . Macrocytosis 08/17/2011 . Pancreatitis  . Pneumonia  . SAH (subarachnoid hemorrhage) ([place])  . Seizures ([place])   last one 6 mos ago       Past Surgical History: Procedure Laterality Date . BACK SURGERY   . ESOPHAGOGASTRODUODENOSCOPY (EGD) WITH PROPOFOL N/A 08/14/2017  Procedure: ESOPHAGOGASTRODUODENOSCOPY (EGD) WITH PROPOFOL;  Surgeon: Daneil Dolin, MD;  [facility];  Service: Endoscopy;  Laterality: N/A;  1:30pm . ESOPHAGOGASTRODUODENOSCOPY (EGD) WITH PROPOFOL N/A 03/13/2018  Procedure: ESOPHAGOGASTRODUODENOSCOPY (EGD) WITH PROPOFOL;  Surgeon: Daneil Dolin, MD;  [facility];  Service: Endoscopy;  Laterality: N/A;  2:30pm . ESOPHAGOGASTRODUODENOSCOPY (EGD) WITH PROPOFOL N/A 08/22/2020  Procedure: ESOPHAGOGASTRODUODENOSCOPY (EGD) WITH PROPOFOL;  Surgeon: Aviva Signs, MD;  Location: [facility];  Service: General;  Laterality: N/A; . PEG PLACEMENT N/A 08/22/2020  Procedure: PERCUTANEOUS ENDOSCOPIC GASTROSTOMY (PEG) PLACEMENT;  Surgeon: Aviva Signs, MD;  Location: [facility];  Service: General;  Laterality:  N/A; . PORTACATH PLACEMENT Left 08/22/2020  Procedure: INSERTION PORT-A-CATH;  Surgeon: Aviva Signs, MD;  Location: [facility];  Service: General;  Laterality: Left; . SEPTOPLASTY     There were no vitals filed for this visit.       Subjective Assessment - 08/23/20 1607       Subjective "I can swallow ok."    Special Tests MBSS    Currently in Pain? Yes    Pain Score 5     Pain Location Epigastric              General - 08/23/20 1607           General Information   Date of Onset 07/26/20    HPI Yerik Zeringue is a 44 yo male who was referred for a clinical swallow  evaluation by Dr. Derek Jack due to recent diagnosis of epiglottic squamous cell carcinoma. Pt had an ultrasound of his right neck for right neck mass 04/28/2020. PET CT scan on 08/05/2020 shows epiglottis is thickened and irregular with hypermetabolic.  Left level 2 lymph node positive.  Right-sided level 2 nodal metastasis measuring 2.7 x 2.2 cm.  Bilateral areas of peribronchovascular reticulonodular opacities with hypermetabolism favoring infection.  No extra cervical metastatic disease.  Thoracic nodes with low-level of hypermetabolism favored to be reactive. Fiberoptic exam by Dr. Benjamine Mola on 08/02/2020 showed tongue base mass noted that involved the superior aspect of the epiglottis.  Arytenoid mucosa was edematous with slight erythema.  True vocal cords were pale yellow and edematous but without mass or lesion. FNA of the right neck lymph node on 08/02/2020 consistent with squamous cell carcinoma. 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 He is an active smoker (1 pack/day for 30 years) and drinks 3 beers per day for the past 10 years. He will likely undergo chemoradiation therapy. He is scheduled for port placement and PEG tube on 08/22/2020 with Dr. Arnoldo Morale. He reports dysphagia to solid foods and has lost at least 23 pounds in the past few months. He reports that he typically only eats one meal per day. BSE was completed on 08/11/20 with recommendation for MBSS.    Type of Study MBS-Modified Barium Swallow Study    Diet Prior to this Study Regular;Thin liquids    Temperature Spikes Noted No    Respiratory Status Room air    History of Recent Intubation No    Behavior/Cognition Cooperative;Alert    Oral Cavity Assessment Within Functional Limits    Oral Care Completed by SLP No    Oral Cavity - Dentition Edentulous    Vision Functional for self feeding    Self-Feeding Abilities Able to feed self    Patient Positioning Upright in chair    Baseline Vocal Quality Normal;Wet     Volitional Cough Strong    Volitional Swallow Able to elicit    Anatomy Within functional limits   Epiglottis was difficult to visualize   Pharyngeal Secretions Not observed secondary MBS              Oral Preparation/Oral Phase - 08/23/20 1608           Oral Preparation/Oral Phase   Oral Phase Within functional limits        Electrical stimulation - Oral Phase   Was Electrical Stimulation Used No             Pharyngeal Phase - 08/23/20 1609           Pharyngeal  Phase   Pharyngeal Phase Impaired        Pharyngeal - Nectar   Pharyngeal- Nectar Cup Swallow initiation at vallecula;Reduced epiglottic inversion;Reduced airway/laryngeal closure;Pharyngeal residue - pyriform        Pharyngeal - Thin   Pharyngeal- Thin Teaspoon Swallow initiation at vallecula;Reduced epiglottic inversion;Reduced airway/laryngeal closure;Penetration/Apiration after swallow;Trace aspiration;Pharyngeal residue - pyriform;Inter-arytenoid space residue    Pharyngeal Material enters airway, passes BELOW cords without attempt by patient to eject out (silent aspiration)    Pharyngeal- Thin Cup Swallow initiation at vallecula;Reduced epiglottic inversion;Reduced airway/laryngeal closure;Penetration/Apiration after swallow;Trace aspiration;Penetration/Aspiration during swallow;Pharyngeal residue - pyriform;Inter-arytenoid space residue    Pharyngeal Material enters airway, passes BELOW cords without attempt by patient to eject out (silent aspiration)        Pharyngeal - Solids   Pharyngeal- Puree Swallow initiation at vallecula;Reduced epiglottic inversion;Lateral channel residue;Pharyngeal residue - pyriform;Pharyngeal residue - posterior pharnyx    Pharyngeal- Mechanical Soft Pharyngeal residue - pyriform;Swallow initiation at vallecula;Reduced epiglottic inversion;Pharyngeal residue - posterior pharnyx    Pharyngeal- Pill Within functional limits        Electrical Stimulation - Pharyngeal Phase   Was  Electrical Stimulation Used No             Cricopharyngeal Phase - 08/23/20 1614           Cervical Esophageal Phase   Cervical Esophageal Phase Within functional limits               SLP Short Term Goals - 08/23/20 1620           SLP SHORT TERM GOAL #1   Title Complete MBSS    Time 2    Period Weeks    Status Achieved   completed 08/23/2020   Target Date 08/25/20        SLP SHORT TERM GOAL #2   Title Pt will verbalize 3+ signs/symptoms of possible aspiration with min cues from SLP and written source as needed.    Baseline Presented this date    Time 2    Period Months    Status On-going    Target Date 10/13/20        SLP SHORT TERM GOAL #3   Title Pt will complete oropharyngeal swallowing exercises x10 each after introduced by SLP and given mi/mod cues for accurate execution.    Baseline Will introduce after MBSS    Time 2    Period Months    Status On-going    Target Date 10/13/20             SLP Long Term Goals - 08/23/20 1621           SLP LONG TERM GOAL #1   Title Pt will demonstrate safe and efficient consumption of self-regulated regular textures and unrestricted liquids with use of compensatory strategies as needed    Baseline Pt consuming soft solids with difficulty, all liquids    Time 3    Period Months    Status On-going             Plan - 08/23/20 1616       Clinical Impression Statement MBSS reveals moderate pharyngeal phase dysphagia characterized by reduced epiglottic deflection with incomplete laryngeal vestibule closure and approximation with posterior pharyngeal wall due to epiglottic mass resulting in penetration during and after swallow and aspiration after the swallow in trace/min amounts with thins (and strongly suspect with secretions) due to residuals in pyriforms/arytenoids. Pt reported not feeling aspiration and did not produce  a cough, however he presented with wet vocal quality and throat clearing and aspirate could be seen mixed  with secretions bobbing in and out of laryngeal vestibule. Although aspiration was not observed with NTL, residuals were greater in the pyriforms and did not completely clear. Pt with mi/mod pyriform residue with mechanical soft textures which were diminished with sips of liquid and repeat swallows. Barium tablet was swallowed without issue with cup thin, however trace aspiration of thins after the swallow. Head turns Left and Right were attempted and found to be of little benefit (Pt also turned AP). Breath hold was mildly effective on 50% of trials thin when cued to do just before swallow and followed by a throat clear/cough and repeat swallow, however some residuals still remained near arytenoids and in pyriforms. Recommend that Pt continue to eat by mouth (mech soft, purees, and thins ok) with excellent oral care and focus on strong cough post swallow and cough/throat clear whenever Pt notices wet vocal quality. Also recommend dysphagia treatment with swallowing exercises throughout the course of his cancer treatment and thereafter to help manage the effects of post radiation sequelae. Pt just had PEG placed yesterday and will follow up with tube feedings per RD. SLP will encourage Pt to attend SLP treatment once per week during his treatment.    Speech Therapy Frequency 1x /week    Duration --   6 weeks   Treatment/Interventions Aspiration precaution training;SLP instruction and feedback;Compensatory strategies;Pharyngeal strengthening exercises;Diet toleration management by SLP;Compensatory techniques;Patient/family education    Potential to Achieve Goals Good    Potential Considerations Other (comment)   chemoradiation sequelae   SLP Home Exercise Plan Pt will completed HEP as assigned to facilitate carryover of treatment strategies and techniques in home environment with use of written cues    Consulted and Agree with Plan of Care Patient;Family member/caregiver    Family Member Consulted [name], significant other        Patient will benefit from skilled therapeutic intervention in order to improve the following deficits and impairments:  Dysphagia, oropharyngeal phase        Recommendations/Treatment - 08/23/20 1614           Swallow Evaluation Recommendations   SLP Diet Recommendations Dysphagia 3 (mechanical soft);Thin    Liquid Administration via Cup;No straw    Medication Administration Whole meds with puree    Supervision Patient able to self feed    Compensations Multiple dry swallows after each bite/sip;Clear throat after each swallow    Postural Changes Seated upright at 90 degrees;Remain upright for at least 30 minutes after feeds/meals             Prognosis - 08/23/20 1614           Prognosis   Prognosis for Safe Diet Advancement Fair    Barriers to Reach Goals Severity of deficits    Barriers/Prognosis Comment radiation sequelae        Individuals Consulted   Consulted and Agree with Results and Recommendations Patient    Report Sent to  Referring physician        Problem List Patient Active Problem List  Diagnosis Date Noted . Squamous cell carcinoma of epiglottis (HCC)  . Abdominal pain, epigastric 12/31/2017 . Gastric ulcer 08/12/2017 . ETOH abuse  . Atypical chest pain  . Hypercalcemia 10/08/2016 . Hypokalemia 10/08/2016 . Diarrhea 10/09/2015 . Pancreatitis, alcoholic, acute  . Alcohol-induced chronic pancreatitis ([place])  . Acute pancreatitis  . Pancreatitis 12/26/2014 . Pancreatic pseudocyst  12/26/2014 . Hyponatremia 12/26/2014 . HTN (hypertension) 07/27/2013 . Abdominal pain 07/11/2012 . Macrocytosis 08/17/2011 . Marijuana abuse 08/17/2011 . Bradycardia 08/17/2011 . Alcohol withdrawal ([place]) 08/17/2011 . Elevated blood pressure 07/08/2011 . Left against medical advice 07/08/2011 . Acute alcoholic pancreatitis 30/16/0109 . Alcohol abuse, continuous 07/07/2011 . Tobacco abuse 07/07/2011 . Seizure disorder ([place]) 07/07/2011 . Adynamic ileus ([place])  07/07/2011 . Wheezing 07/07/2011 . COPD (chronic obstructive pulmonary disease) ([place]) 07/07/2011  Thank you,  Genene Churn, [place]  [place] 08/23/2020, 4:22 PM  [facility] [address] [place], [place], [address] Phone: [phone]   Fax:  [phone]  Name: [name] MRN: [id number] Date of Birth: [birth year]    Electronically signed by Ephraim Hamburger, CCC-SLP at 08/23/2020 4:36 PM   DG C-Arm 1-60 Min-No Report  Result Date: 08/22/2020 Fluoroscopy was utilized by the requesting physician.  No radiographic interpretation.     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 -Ultrasound of the neck on 04/28/2020 with 2.8 x 1.7 cm mass on the right neck and several relatively small hypoechoic lymph nodes in the left side. -Weight loss of 22 pounds in the last 3 months. No fevers or night sweats. -Positive for dysphagia. -PET CT scan on 08/05/2020 shows epiglottis is thickened and irregular with hypermetabolic. Left level 2 lymph node positive. Right-sided level 2 nodal metastasis measuring 2.7 x 2.2 cm. Bilateral areas of peribronchovascular reticulonodular opacities with hypermetabolism favoring infection. No extra cervical metastatic disease. Thoracic nodes with low-level of hypermetabolism favored to be reactive. -Fiberoptic exam by Dr. Benjamine Mola on 08/02/2020 showed tongue base mass noted that involved the superior aspect of the epiglottis. Arytenoid mucosa was edematous with slight erythema. True vocal cords were pale yellow and edematous but without mass or lesion. -FNA of the right neck lymph node on 08/02/2020 consistent with squamous cell carcinoma.  2. Social/family history: -Worked in the roofing, now on disability. -1 pack/day for 30 years, active smoker. -3 beers per day for the last 10 years.   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  No ringing in the ears or neuropathy. -Reviewed labs today which showed normal LFTs.  Creatinine is also normal.  White count and platelet count was adequate. -Proceed with week 3 of treatment.  RTC 1 week.  2. Weight loss: -He is taking anywhere between 2 to 4 cans of Osmolite 1.5 daily. -He lost about 5 pounds. -I have counseled him to increase his Osmolite 1.5 to 4 cans daily on a consistent basis.Marland Kitchen  3. Right neck pain: -Continue oxycodone 10 mg every 4 hours as needed.  4. Ongoing EtOH use: -He is currently drinking half can of 40 ounce beer daily. -He is also taking Librium 25 mg 1 tablet in the mornings.  Once he stops drinking beer completely, I have told him to take Librium every 6 hours.  5.  Hyponatremia: -Sodium improved to 126.  This is from SIADH and treatment related. -Recommended drinking chicken broth.   Orders placed this encounter:  No orders of the defined types were placed in this encounter.    Derek Jack, MD [place] [phone]   I, Milinda Antis, am acting as a scribe for Dr. Sanda Linger.  I, Derek Jack MD, have reviewed the above documentation for accuracy and completeness, and I agree  with the above.     

## 2020-09-20 ENCOUNTER — Encounter (HOSPITAL_COMMUNITY): Payer: Self-pay

## 2020-09-20 ENCOUNTER — Inpatient Hospital Stay (HOSPITAL_COMMUNITY): Payer: Medicaid Other

## 2020-09-20 VITALS — BP 124/61 | HR 77 | Temp 97.6°F | Resp 18

## 2020-09-20 DIAGNOSIS — C321 Malignant neoplasm of supraglottis: Secondary | ICD-10-CM

## 2020-09-20 DIAGNOSIS — Z5111 Encounter for antineoplastic chemotherapy: Secondary | ICD-10-CM | POA: Diagnosis not present

## 2020-09-20 MED ORDER — SODIUM CHLORIDE 0.9 % IV SOLN
Freq: Once | INTRAVENOUS | Status: AC
  Filled 2020-09-20: qty 10

## 2020-09-20 MED ORDER — SODIUM CHLORIDE 0.9% FLUSH
10.0000 mL | Freq: Once | INTRAVENOUS | Status: AC
  Administered 2020-09-20: 10 mL via INTRAVENOUS

## 2020-09-20 MED ORDER — HEPARIN SOD (PORK) LOCK FLUSH 100 UNIT/ML IV SOLN
500.0000 [IU] | Freq: Once | INTRAVENOUS | Status: AC
  Administered 2020-09-20: 500 [IU] via INTRAVENOUS

## 2020-09-20 NOTE — Patient Instructions (Signed)
[facility] at [facility]  Discharge Instructions:   _______________________________________________________________  Thank you for choosing [facility] at [facility] to provide your oncology and hematology care.  To afford each patient quality time with our providers, please arrive at least 15 minutes before your scheduled appointment.  You need to re-schedule your appointment if you arrive 10 or more minutes late.  We strive to give you quality time with our providers, and arriving late affects you and other patients whose appointments are after yours.  Also, if you no show three or more times for appointments you may be dismissed from the clinic.  Again, thank you for choosing [facility] at [facility]. Our hope is that these requests will allow you access to exceptional care and in a timely manner. _______________________________________________________________  If you have questions after your visit, please contact our office at [phone] between the hours of 8:30 a.m. and 5:00 p.m. Voicemails left after 4:30 p.m. will not be returned until the following business day. _______________________________________________________________  For prescription refill requests, have your pharmacy contact our office. _______________________________________________________________  Recommendations made by the consultant and any test results will be sent to your referring physician. _______________________________________________________________ 

## 2020-09-20 NOTE — Progress Notes (Signed)
Pt tolerated fluids well today without incidence. Discharged in stable condition ambulatory.

## 2020-09-21 ENCOUNTER — Other Ambulatory Visit: Payer: Self-pay

## 2020-09-21 ENCOUNTER — Inpatient Hospital Stay (HOSPITAL_COMMUNITY): Payer: Medicaid Other

## 2020-09-21 VITALS — BP 115/89 | HR 65 | Temp 97.3°F | Resp 18

## 2020-09-21 DIAGNOSIS — C321 Malignant neoplasm of supraglottis: Secondary | ICD-10-CM

## 2020-09-21 DIAGNOSIS — Z5111 Encounter for antineoplastic chemotherapy: Secondary | ICD-10-CM | POA: Diagnosis not present

## 2020-09-21 MED ORDER — SODIUM CHLORIDE 0.9% FLUSH
10.0000 mL | Freq: Once | INTRAVENOUS | Status: AC
  Administered 2020-09-21: 10 mL

## 2020-09-21 MED ORDER — OXYCODONE HCL 5 MG PO TABS
10.0000 mg | ORAL_TABLET | Freq: Once | ORAL | Status: AC
  Administered 2020-09-21: 10 mg via ORAL
  Filled 2020-09-21: qty 2

## 2020-09-21 MED ORDER — SODIUM CHLORIDE 0.9 % IV SOLN
Freq: Once | INTRAVENOUS | Status: AC
  Filled 2020-09-21: qty 10

## 2020-09-21 MED ORDER — HEPARIN SOD (PORK) LOCK FLUSH 100 UNIT/ML IV SOLN
500.0000 [IU] | Freq: Once | INTRAVENOUS | Status: AC
  Administered 2020-09-21: 500 [IU] via INTRAVENOUS

## 2020-09-21 NOTE — Progress Notes (Signed)
Patient presents today for hydration.  Vital signs stable.  No new complaints since last visit.  Hydration given today per MD orders.  Tolerated infusion without adverse affects.  Vital signs stable.  No complaints at this time.  Discharge from clinic ambulatory in stable condition.  Alert and oriented X 3.  Follow up with [facility] as scheduled.

## 2020-09-21 NOTE — Patient Instructions (Signed)
[facility] Discharge Instructions for Patients Today you received the following hydration  To help prevent nausea and vomiting after your treatment, we encourage you to take your nausea medication  If you develop nausea and vomiting that is not controlled by your nausea medication, call the clinic.   BELOW ARE SYMPTOMS THAT SHOULD BE REPORTED IMMEDIATELY:  *FEVER GREATER THAN 100.5 F  *CHILLS WITH OR WITHOUT FEVER  NAUSEA AND VOMITING THAT IS NOT CONTROLLED WITH YOUR NAUSEA MEDICATION  *UNUSUAL SHORTNESS OF BREATH  *UNUSUAL BRUISING OR BLEEDING  TENDERNESS IN MOUTH AND THROAT WITH OR WITHOUT PRESENCE OF ULCERS  *URINARY PROBLEMS  *BOWEL PROBLEMS  UNUSUAL RASH Items with * indicate a potential emergency and should be followed up as soon as possible.  Feel free to call the clinic should you have any questions or concerns. The clinic phone number is [phone].  Please show the [facility] at check-in to the Emergency Department and triage nurse.

## 2020-09-26 ENCOUNTER — Other Ambulatory Visit: Payer: Self-pay

## 2020-09-26 ENCOUNTER — Inpatient Hospital Stay (HOSPITAL_COMMUNITY): Payer: Medicaid Other

## 2020-09-26 ENCOUNTER — Inpatient Hospital Stay (HOSPITAL_BASED_OUTPATIENT_CLINIC_OR_DEPARTMENT_OTHER): Payer: Medicaid Other | Admitting: Hematology

## 2020-09-26 VITALS — BP 130/65 | HR 68 | Temp 96.8°F | Resp 18 | Wt 103.4 lb

## 2020-09-26 VITALS — BP 120/70 | HR 80 | Temp 96.8°F | Resp 18

## 2020-09-26 DIAGNOSIS — C321 Malignant neoplasm of supraglottis: Secondary | ICD-10-CM

## 2020-09-26 DIAGNOSIS — Z5111 Encounter for antineoplastic chemotherapy: Secondary | ICD-10-CM | POA: Diagnosis not present

## 2020-09-26 DIAGNOSIS — Z95828 Presence of other vascular implants and grafts: Secondary | ICD-10-CM

## 2020-09-26 LAB — MAGNESIUM: Magnesium: 1.9 mg/dL (ref 1.7–2.4)

## 2020-09-26 LAB — CBC WITH DIFFERENTIAL/PLATELET
Abs Immature Granulocytes: 0.01 10*3/uL (ref 0.00–0.07)
Basophils Absolute: 0 10*3/uL (ref 0.0–0.1)
Basophils Relative: 1 %
Eosinophils Absolute: 0.1 10*3/uL (ref 0.0–0.5)
Eosinophils Relative: 2 %
HCT: 33.7 % — ABNORMAL LOW (ref 39.0–52.0)
Hemoglobin: 11.6 g/dL — ABNORMAL LOW (ref 13.0–17.0)
Immature Granulocytes: 0 %
Lymphocytes Relative: 22 %
Lymphs Abs: 1.2 10*3/uL (ref 0.7–4.0)
MCH: 33.8 pg (ref 26.0–34.0)
MCHC: 34.4 g/dL (ref 30.0–36.0)
MCV: 98.3 fL (ref 80.0–100.0)
Monocytes Absolute: 1.3 10*3/uL — ABNORMAL HIGH (ref 0.1–1.0)
Monocytes Relative: 24 %
Neutro Abs: 2.7 10*3/uL (ref 1.7–7.7)
Neutrophils Relative %: 51 %
Platelets: 240 10*3/uL (ref 150–400)
RBC: 3.43 MIL/uL — ABNORMAL LOW (ref 4.22–5.81)
RDW: 12.9 % (ref 11.5–15.5)
WBC: 5.3 10*3/uL (ref 4.0–10.5)
nRBC: 0 % (ref 0.0–0.2)

## 2020-09-26 LAB — COMPREHENSIVE METABOLIC PANEL
ALT: 18 U/L (ref 0–44)
AST: 21 U/L (ref 15–41)
Albumin: 3.7 g/dL (ref 3.5–5.0)
Alkaline Phosphatase: 74 U/L (ref 38–126)
Anion gap: 10 (ref 5–15)
BUN: <5 mg/dL — ABNORMAL LOW (ref 6–20)
CO2: 29 mmol/L (ref 22–32)
Calcium: 9.1 mg/dL (ref 8.9–10.3)
Chloride: 84 mmol/L — ABNORMAL LOW (ref 98–111)
Creatinine, Ser: <0.3 mg/dL — ABNORMAL LOW (ref 0.61–1.24)
Glucose, Bld: 112 mg/dL — ABNORMAL HIGH (ref 70–99)
Potassium: 3.7 mmol/L (ref 3.5–5.1)
Sodium: 123 mmol/L — ABNORMAL LOW (ref 135–145)
Total Bilirubin: 0.5 mg/dL (ref 0.3–1.2)
Total Protein: 8 g/dL (ref 6.5–8.1)

## 2020-09-26 MED ORDER — SODIUM CHLORIDE 0.9 % IV SOLN
10.0000 mg | Freq: Once | INTRAVENOUS | Status: AC
  Administered 2020-09-26: 10 mg via INTRAVENOUS
  Filled 2020-09-26: qty 10

## 2020-09-26 MED ORDER — SODIUM CHLORIDE 0.9 % IV SOLN
150.0000 mg | Freq: Once | INTRAVENOUS | Status: AC
  Administered 2020-09-26: 150 mg via INTRAVENOUS
  Filled 2020-09-26: qty 150

## 2020-09-26 MED ORDER — LEVETIRACETAM 500 MG PO TABS
1000.0000 mg | ORAL_TABLET | Freq: Once | ORAL | Status: AC
  Administered 2020-09-26: 1000 mg via ORAL
  Filled 2020-09-26: qty 2

## 2020-09-26 MED ORDER — SODIUM CHLORIDE 0.9% FLUSH
10.0000 mL | INTRAVENOUS | Status: DC | PRN
  Administered 2020-09-26: 10 mL

## 2020-09-26 MED ORDER — OXYCODONE HCL 5 MG PO TABS
10.0000 mg | ORAL_TABLET | Freq: Once | ORAL | Status: AC
  Administered 2020-09-26: 10 mg via ORAL
  Filled 2020-09-26: qty 2

## 2020-09-26 MED ORDER — SODIUM CHLORIDE 0.9 % IV SOLN: Freq: Once | INTRAVENOUS | Status: AC

## 2020-09-26 MED ORDER — SODIUM CHLORIDE 0.9 % IV SOLN
Freq: Once | INTRAVENOUS | Status: AC
  Filled 2020-09-26: qty 10

## 2020-09-26 MED ORDER — CARBAMAZEPINE 200 MG PO TABS
400.0000 mg | ORAL_TABLET | Freq: Once | ORAL | Status: AC
  Administered 2020-09-26: 400 mg via ORAL
  Filled 2020-09-26: qty 2

## 2020-09-26 MED ORDER — HEPARIN SOD (PORK) LOCK FLUSH 100 UNIT/ML IV SOLN
500.0000 [IU] | Freq: Once | INTRAVENOUS | Status: AC | PRN
  Administered 2020-09-26: 500 [IU]

## 2020-09-26 MED ORDER — PALONOSETRON HCL INJECTION 0.25 MG/5ML
0.2500 mg | Freq: Once | INTRAVENOUS | Status: AC
  Administered 2020-09-26: 0.25 mg via INTRAVENOUS
  Filled 2020-09-26: qty 5

## 2020-09-26 MED ORDER — SODIUM CHLORIDE 0.9 % IV SOLN
40.0000 mg/m2 | Freq: Once | INTRAVENOUS | Status: AC
  Administered 2020-09-26: 60 mg via INTRAVENOUS
  Filled 2020-09-26: qty 60

## 2020-09-26 NOTE — Progress Notes (Signed)
Pt here for D1C4 of cisplatin.  Sodium 123, chloride 84.  Labs reviewed with Dr Raliegh Ip.   Faythe Ghee to start hydration fluids per Dr Jill Poling for treatment today. Tegretol 400 mg , keppra 1000mg  and oxycodone 10 mg given at bedside today.   Pt had 400 mL urine output.   Tolerated treatment well today without incidence.  Discharged in stable condition ambulatory. Vital signs stable prior to discharge.

## 2020-09-26 NOTE — Progress Notes (Signed)
[place], [place] [address]   CLINIC:  Medical Oncology/Hematology  PCP:  Jani Gravel, MD [address] / [place] [address] [phone]   REASON FOR VISIT:  Follow-up for epiglottic squamous cell carcinoma  PRIOR THERAPY: None  NGS Results: Not done  CURRENT THERAPY: Concurrent chemoradiation with cisplatin weekly  BRIEF ONCOLOGIC HISTORY:  Oncology History  Squamous cell carcinoma of epiglottis ([place])  08/22/2020 Initial Diagnosis   Squamous cell carcinoma of epiglottis ([place])   09/05/2020 -  Chemotherapy   The patient had palonosetron (ALOXI) injection 0.25 mg, 0.25 mg, Intravenous,  Once, 4 of 7 cycles Administration: 0.25 mg (09/05/2020), 0.25 mg (09/12/2020), 0.25 mg (09/19/2020) CISplatin (PLATINOL) 60 mg in sodium chloride 0.9 % 250 mL chemo infusion, 40 mg/m2 = 60 mg, Intravenous,  Once, 4 of 7 cycles Administration: 60 mg (09/05/2020), 60 mg (09/12/2020), 60 mg (09/19/2020) fosaprepitant (EMEND) 150 mg in sodium chloride 0.9 % 145 mL IVPB, 150 mg, Intravenous,  Once, 4 of 7 cycles Administration: 150 mg (09/05/2020), 150 mg (09/12/2020), 150 mg (09/19/2020)  for chemotherapy treatment.      CANCER STAGING: Cancer Staging No matching staging information was found for the patient.  INTERVAL HISTORY:  [name], a 44 y.o. male, returns for routine follow-up and consideration for next cycle of chemotherapy. [name] was last seen on 09/19/2020.  Due for cycle #4 of cisplatin and Aloxi today.   Today he is accompanied by his sister. Overall, he tells me he has been feeling poorly. He has not taken any of his medications this AM due to his back to back appointments. He tolerated the previous chemo well and denies having any N/V/D, tinnitus, numbness or tingling. He complains of pain in the right side of his neck. He is putting in 3-4 cans of tube feeds daily. He is taking oxycodone 1 tablet every 4 hours. He  is able to eat snacks by mouth but complains of pain with swallowing. He is not able to taste most of his food. He continues having abdominal pain around his PEG tube.  He continues drinking about 20 ounces of beer and some water by mouth per day and 1 tablet of Librium daily. He will finish radiation on 12/29.  Overall, he feels ready for next cycle of chemo today.    REVIEW OF SYSTEMS:  Review of Systems  Constitutional: Positive for appetite change (50%) and fatigue (25%).  HENT:   Positive for trouble swallowing (pain when swallowing).   Musculoskeletal: Positive for myalgias (8/10 generalized pain all over) and neck pain (R side neck pain).  Neurological: Positive for headaches.  All other systems reviewed and are negative.   PAST MEDICAL/SURGICAL HISTORY:  Past Medical History:  Diagnosis Date  . Back pain   . Bradycardia 08/17/2011  . Cancer ([place])   . COPD (chronic obstructive pulmonary disease) ([place])   . ETOH abuse   . GERD (gastroesophageal reflux disease)   . Macrocytosis 08/17/2011  . Pancreatitis   . Pneumonia   . Port-A-Cath in place 09/04/2020  . SAH (subarachnoid hemorrhage) ([place])   . Seizures ([place])    last one 6 mos ago   Past Surgical History:  Procedure Laterality Date  . BACK SURGERY    . ESOPHAGOGASTRODUODENOSCOPY (EGD) WITH PROPOFOL N/A 08/14/2017   Procedure: ESOPHAGOGASTRODUODENOSCOPY (EGD) WITH PROPOFOL;  Surgeon: Daneil Dolin, MD;  Location[facility];  Service: Endoscopy;  Laterality: N/A;  1:30pm  .  ESOPHAGOGASTRODUODENOSCOPY (EGD) WITH PROPOFOL N/A 03/13/2018   Procedure: ESOPHAGOGASTRODUODENOSCOPY (EGD) WITH PROPOFOL;  Surgeon: Daneil Dolin, MD;  Location: [facility];  Service: Endoscopy;  Laterality: N/A;  2:30pm  . ESOPHAGOGASTRODUODENOSCOPY (EGD) WITH PROPOFOL N/A 08/22/2020   Procedure: ESOPHAGOGASTRODUODENOSCOPY (EGD) WITH PROPOFOL;  Surgeon: Aviva Signs, MD;  Location: [facility] ORS;  Service: General;  Laterality: N/A;  . PEG  PLACEMENT N/A 08/22/2020   Procedure: PERCUTANEOUS ENDOSCOPIC GASTROSTOMY (PEG) PLACEMENT;  Surgeon: Aviva Signs, MD;  Location: [facility] ORS;  Service: General;  Laterality: N/A;  . PORTACATH PLACEMENT Left 08/22/2020   Procedure: INSERTION PORT-A-CATH;  Surgeon: Aviva Signs, MD;  Location: [facility] ORS;  Service: General;  Laterality: Left;  . SEPTOPLASTY      SOCIAL HISTORY:  Social History   Socioeconomic History  . Marital status: Single    Spouse name: Not on file  . Number of children: Not on file  . Years of education: Not on file  . Highest education level: Not on file  Occupational History  . Not on file  Tobacco Use  . Smoking status: Current Every Day Smoker    Packs/day: 1.00    Years: 20.00    Pack years: 20.00    Types: Cigarettes  . Smokeless tobacco: Never Used  Vaping Use  . Vaping Use: Never used  Substance and Sexual Activity  . Alcohol use: Yes    Comment: couple of beers daily  . Drug use: Not Currently    Types: Marijuana    Comment: months   . Sexual activity: Not Currently  Other Topics Concern  . Not on file  Social History Narrative  . Not on file   Social Determinants of Health   Financial Resource Strain: Low Risk   . Difficulty of Paying Living Expenses: Not very hard  Food Insecurity: No Food Insecurity  . Worried About Charity fundraiser in the Last Year: Never true  . Ran Out of Food in the Last Year: Never true  Transportation Needs: No Transportation Needs  . Lack of Transportation (Medical): No  . Lack of Transportation (Non-Medical): No  Physical Activity: Sufficiently Active  . Days of Exercise per Week: 7 days  . Minutes of Exercise per Session: 60 min  Stress: Stress Concern Present  . Feeling of Stress : To some extent  Social Connections: Moderately Isolated  . Frequency of Communication with Friends and Family: More than three times a week  . Frequency of Social Gatherings with Friends and Family: More than three times a week   . Attends Religious Services: Never  . Active Member of Clubs or Organizations: No  . Attends Archivist Meetings: Never  . Marital Status: Living with partner  Intimate Partner Violence: Not At Risk  . Fear of Current or Ex-Partner: No  . Emotionally Abused: No  . Physically Abused: No  . Sexually Abused: No    FAMILY HISTORY:  Family History  Problem Relation Age of Onset  . Coronary artery disease Mother        deceased age 76  . Seizures Father   . Alcohol abuse Father   . Colon cancer Neg Hx     CURRENT MEDICATIONS:  Current Outpatient Medications  Medication Sig Dispense Refill  . albuterol (PROVENTIL HFA;VENTOLIN HFA) 108 (90 BASE) MCG/ACT inhaler Inhale 2 puffs into the lungs every 6 (six) hours as needed for wheezing or shortness of breath.    Marland Kitchen albuterol (PROVENTIL) (2.5 MG/3ML) 0.083% nebulizer solution Take 2.5  mg by nebulization every 6 (six) hours as needed for wheezing or shortness of breath.    Marland Kitchen amLODipine (NORVASC) 10 MG tablet Take 10 mg by mouth daily.     . carbamazepine (TEGRETOL) 200 MG tablet Take 400 mg by mouth 2 (two) times daily.    . chlordiazePOXIDE (LIBRIUM) 25 MG capsule Take 1 capsule (25 mg total) by mouth every 6 (six) hours. 30 capsule 0  . CISPLATIN IV Inject into the vein once a week.    Marland Kitchen dexamethasone 0.5 MG/5ML elixir Take by mouth.    . diclofenac Sodium (VOLTAREN) 1 % GEL Apply 2 g topically daily as needed (pain).     Marland Kitchen diphenhydrAMINE (BENADRYL) 12.5 MG/5ML elixir Take 10 mLs by mouth in the morning, at noon, in the evening, and at bedtime.    . gabapentin (NEURONTIN) 300 MG capsule Take 300 mg by mouth 3 (three) times daily.    Marland Kitchen levETIRAcetam (KEPPRA) 500 MG tablet Take 1,000 mg by mouth 2 (two) times daily.     Marland Kitchen oxyCODONE 10 MG TABS Take 1 tablet (10 mg total) by mouth every 4 (four) hours as needed for severe pain. 180 tablet 0  . pantoprazole (PROTONIX) 40 MG tablet Take 1 tablet (40 mg total) by mouth 2 (two) times  daily before a meal. 60 tablet 5  . lidocaine-prilocaine (EMLA) cream Apply small amount to port a cath site and cover with plastic wrap 1 hour prior to chemotherapy appointments (Patient not taking: Reported on 09/26/2020) 30 g 3  . prochlorperazine (COMPAZINE) 10 MG tablet Take 1 tablet (10 mg total) by mouth every 6 (six) hours as needed for nausea or vomiting. (Patient not taking: Reported on 09/26/2020) 30 tablet 2   No current facility-administered medications for this visit.   Facility-Administered Medications Ordered in Other Visits  Medication Dose Route Frequency Provider Last Rate Last Admin  . carbamazepine (TEGRETOL) tablet 400 mg  400 mg Oral Once Derek Jack, MD      . levETIRAcetam (KEPPRA) tablet 1,000 mg  1,000 mg Oral Once Derek Jack, MD      . oxyCODONE (Oxy IR/ROXICODONE) immediate release tablet 10 mg  10 mg Oral Once Derek Jack, MD        ALLERGIES:  No Known Allergies  PHYSICAL EXAM:  Performance status (ECOG): 1 - Symptomatic but completely ambulatory  Vitals:   09/26/20 0923  BP: 130/65  Pulse: 68  Resp: 18  Temp: (!) 96.8 F (36 C)  SpO2: 100%   Wt Readings from Last 3 Encounters:  09/26/20 103 lb 6.4 oz (46.9 kg)  09/19/20 102 lb 3.2 oz (46.4 kg)  09/12/20 110 lb 12.8 oz (50.3 kg)   Physical Exam Vitals reviewed.  Constitutional:      Appearance: Normal appearance.  HENT:     Mouth/Throat:     Lips: No lesions.     Mouth: No oral lesions.     Tongue: No lesions.     Pharynx: Posterior oropharyngeal erythema present.  Cardiovascular:     Rate and Rhythm: Normal rate and regular rhythm.     Pulses: Normal pulses.     Heart sounds: Normal heart sounds.  Pulmonary:     Effort: Pulmonary effort is normal.     Breath sounds: Normal breath sounds.  Chest:     Comments: Port-a-Cath in L chest Abdominal:     Tenderness: There is abdominal tenderness (around PEG tube).     Comments: PEG tube  Musculoskeletal:  Right lower leg: No edema.     Left lower leg: No edema.  Lymphadenopathy:     Cervical: Cervical adenopathy (R neck mass decreasing) present.  Neurological:     General: No focal deficit present.     Mental Status: He is alert and oriented to person, place, and time.  Psychiatric:        Mood and Affect: Mood normal.        Behavior: Behavior normal.     LABORATORY DATA:  I have reviewed the labs as listed.  CBC Latest Ref Rng & Units 09/26/2020 09/19/2020 09/12/2020  WBC 4.0 - 10.5 K/uL 5.3 5.2 5.2  Hemoglobin 13.0 - 17.0 g/dL 11.6(L) 11.8(L) 11.2(L)  Hematocrit 39 - 52 % 33.7(L) 34.7(L) 32.7(L)  Platelets 150 - 400 K/uL 240 281 241   CMP Latest Ref Rng & Units 09/26/2020 09/19/2020 09/12/2020  Glucose 70 - 99 mg/dL 112(H) 109(H) 229(H)  BUN 6 - 20 mg/dL <5(L) <5(L) 7  Creatinine 0.61 - 1.24 mg/dL <0.30(L) <0.30(L) 0.33(L)  Sodium 135 - 145 mmol/L 123(L) 126(L) 122(L)  Potassium 3.5 - 5.1 mmol/L 3.7 3.6 3.3(L)  Chloride 98 - 111 mmol/L 84(L) 89(L) 86(L)  CO2 22 - 32 mmol/L 29 26 27   Calcium 8.9 - 10.3 mg/dL 9.1 9.1 8.6(L)  Total Protein 6.5 - 8.1 g/dL 8.0 7.7 7.3  Total Bilirubin 0.3 - 1.2 mg/dL 0.5 0.1(L) 0.3  Alkaline Phos 38 - 126 U/L 74 75 70  AST 15 - 41 U/L 21 20 20   ALT 0 - 44 U/L 18 18 15     DIAGNOSTIC IMAGING:  I have independently reviewed the scans and discussed with the patient. No results found.   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 -Ultrasound of the neck on 04/28/2020 with 2.8 x 1.7 cm mass on the right neck and several relatively small hypoechoic lymph nodes in the left side. -Weight loss of 22 pounds in the last 3 months. No fevers or night sweats. -Positive for dysphagia. -PET CT scan on 08/05/2020 shows epiglottis is thickened and irregular with hypermetabolic. Left level 2 lymph node positive. Right-sided level 2 nodal metastasis measuring 2.7 x 2.2  cm. Bilateral areas of peribronchovascular reticulonodular opacities with hypermetabolism favoring infection. No extra cervical metastatic disease. Thoracic nodes with low-level of hypermetabolism favored to be reactive. -Fiberoptic exam by Dr. Benjamine Mola on 08/02/2020 showed tongue base mass noted that involved the superior aspect of the epiglottis. Arytenoid mucosa was edematous with slight erythema. True vocal cords were pale yellow and edematous but without mass or lesion. -FNA of the right neck lymph node on 08/02/2020 consistent with squamous cell carcinoma.  2. Social/family history: -Worked in the roofing, now on disability. -1 pack/day for 30 years, active smoker. -3 beers per day for the last 10 years.   PLAN:  1.Base of the tongue/epiglottic squamous cell carcinoma (TX N2 cM0): -He denies any nausea vomiting or diarrhea.  No ringing in the ears or neuropathy. -Reviewed his labs.  LFTs and CBC are grossly normal. -Proceed with week four of treatment.  RTC 1 week for cycle five.  2. Weight loss: -He is taking in 3 cans of Osmolite 1.5. -Recommend increasing to 4 cans daily.  3. Right neck pain: -Continue oxycodone 10 mg every 4 hours as needed.  4. Ongoing EtOH use: -Currently drinking half a can of 40 ounce of beer daily. -Currently taking Librium 25  mg 1 tablet in the mornings.  Once he stops drinking beer completely, he was told to take Librium every 6 hours.  5. 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  Sodium today is 123. -Recommend drinking chicken broth.   Orders placed this encounter:  No orders of the defined types were placed in this encounter.    Derek Jack, MD [place] [phone]   I, Milinda Antis, am acting as a scribe for Dr. Sanda Linger.  I, Derek Jack MD, have reviewed the above documentation for accuracy and completeness, and I agree with the above.

## 2020-09-26 NOTE — Patient Instructions (Signed)
[place] at [facility] Discharge Instructions  You were seen today by Dr. Delton Coombes. He went over your recent results. You received your treatment today. Put in 4 cans of tube feeds daily to improve your strength and weight. Dr. Delton Coombes will see you back in 1 week for labs and follow up.   Thank you for choosing [facility] at [facility] to provide your oncology and hematology care.  To afford each patient quality time with our provider, please arrive at least 15 minutes before your scheduled appointment time.   If you have a lab appointment with the [facility] please come in thru the Main Entrance and check in at the main information desk  You need to re-schedule your appointment should you arrive 10 or more minutes late.  We strive to give you quality time with our providers, and arriving late affects you and other patients whose appointments are after yours.  Also, if you no show three or more times for appointments you may be dismissed from the clinic at the providers discretion.     Again, thank you for choosing [facility].  Our hope is that these requests will decrease the amount of time that you wait before being seen by our physicians.       _____________________________________________________________  Should you have questions after your visit to [facility], please contact our office at [phone] between the hours of 8:00 a.m. and 4:30 p.m.  Voicemails left after 4:00 p.m. will not be returned until the following business day.  For prescription refill requests, have your pharmacy contact our office and allow 72 hours.    Cancer Center Support Programs:   > Cancer Support Group  2nd Tuesday of the month 1pm-2pm, Journey Room

## 2020-09-26 NOTE — Patient Instructions (Signed)
[facility] Discharge Instructions for Patients Receiving Chemotherapy  Today you received the following chemotherapy agents   To help prevent nausea and vomiting after your treatment, we encourage you to take your nausea medication   If you develop nausea and vomiting that is not controlled by your nausea medication, call the clinic.   BELOW ARE SYMPTOMS THAT SHOULD BE REPORTED IMMEDIATELY:  *FEVER GREATER THAN 100.5 F  *CHILLS WITH OR WITHOUT FEVER  NAUSEA AND VOMITING THAT IS NOT CONTROLLED WITH YOUR NAUSEA MEDICATION  *UNUSUAL SHORTNESS OF BREATH  *UNUSUAL BRUISING OR BLEEDING  TENDERNESS IN MOUTH AND THROAT WITH OR WITHOUT PRESENCE OF ULCERS  *URINARY PROBLEMS  *BOWEL PROBLEMS  UNUSUAL RASH Items with * indicate a potential emergency and should be followed up as soon as possible.  Feel free to call the clinic should you have any questions or concerns. The clinic phone number is [phone].  Please show the CHEMO ALERT CARD at check-in to the Emergency Department and triage nurse.   

## 2020-09-26 NOTE — Progress Notes (Signed)
Patient was assessed by Dr. Delton Coombes and labs have been reviewed.  Patient did not take his morning medications, Dr. Delton Coombes is okay for him to get his tegretol, keppra and oxycodone today.  Patient is okay to proceed with treatment today. Primary RN and pharmacy aware.

## 2020-09-27 ENCOUNTER — Inpatient Hospital Stay (HOSPITAL_COMMUNITY): Payer: Medicaid Other

## 2020-09-27 VITALS — BP 121/68 | HR 68 | Temp 96.9°F | Resp 18

## 2020-09-27 DIAGNOSIS — Z5111 Encounter for antineoplastic chemotherapy: Secondary | ICD-10-CM | POA: Diagnosis not present

## 2020-09-27 DIAGNOSIS — C321 Malignant neoplasm of supraglottis: Secondary | ICD-10-CM

## 2020-09-27 MED ORDER — HEPARIN SOD (PORK) LOCK FLUSH 100 UNIT/ML IV SOLN
500.0000 [IU] | Freq: Once | INTRAVENOUS | Status: AC
  Administered 2020-09-27: 500 [IU] via INTRAVENOUS

## 2020-09-27 MED ORDER — SODIUM CHLORIDE 0.9% FLUSH
10.0000 mL | Freq: Once | INTRAVENOUS | Status: AC
  Administered 2020-09-27: 10 mL

## 2020-09-27 MED ORDER — SODIUM CHLORIDE 0.9 % IV SOLN
Freq: Once | INTRAVENOUS | Status: AC
  Filled 2020-09-27: qty 10

## 2020-09-27 MED ORDER — OXYCODONE HCL 5 MG PO TABS
10.0000 mg | ORAL_TABLET | Freq: Once | ORAL | Status: AC
  Administered 2020-09-27: 10 mg via ORAL
  Filled 2020-09-27: qty 2

## 2020-09-27 NOTE — Progress Notes (Signed)
Patient presents today for hydration.  No new complaints since last visit. Vital signs stable. Hydrationgiven today per MD orders.  Tolerated infusion without adverse affects.  Vital signs stable.  No complaints at this time.  Discharge from clinic ambulatory in stable condition.  Alert and oriented X 3.  Follow up with [facility] as scheduled.

## 2020-09-28 ENCOUNTER — Inpatient Hospital Stay (HOSPITAL_COMMUNITY): Payer: Medicaid Other | Attending: Hematology

## 2020-09-28 ENCOUNTER — Other Ambulatory Visit: Payer: Self-pay

## 2020-09-28 ENCOUNTER — Encounter (HOSPITAL_COMMUNITY): Payer: Self-pay

## 2020-09-28 VITALS — BP 108/63 | HR 68 | Temp 97.3°F | Resp 18

## 2020-09-28 DIAGNOSIS — Z7289 Other problems related to lifestyle: Secondary | ICD-10-CM | POA: Diagnosis not present

## 2020-09-28 DIAGNOSIS — F17210 Nicotine dependence, cigarettes, uncomplicated: Secondary | ICD-10-CM | POA: Diagnosis not present

## 2020-09-28 DIAGNOSIS — C01 Malignant neoplasm of base of tongue: Secondary | ICD-10-CM | POA: Insufficient documentation

## 2020-09-28 DIAGNOSIS — Z5111 Encounter for antineoplastic chemotherapy: Secondary | ICD-10-CM | POA: Insufficient documentation

## 2020-09-28 DIAGNOSIS — C321 Malignant neoplasm of supraglottis: Secondary | ICD-10-CM | POA: Insufficient documentation

## 2020-09-28 MED ORDER — SODIUM CHLORIDE 0.9 % IV SOLN
Freq: Once | INTRAVENOUS | Status: AC
  Filled 2020-09-28: qty 10

## 2020-09-28 MED ORDER — HEPARIN SOD (PORK) LOCK FLUSH 100 UNIT/ML IV SOLN
500.0000 [IU] | Freq: Once | INTRAVENOUS | Status: AC
  Administered 2020-09-28: 500 [IU] via INTRAVENOUS

## 2020-09-28 MED ORDER — SODIUM CHLORIDE 0.9% FLUSH
10.0000 mL | INTRAVENOUS | Status: DC | PRN
  Administered 2020-09-28: 10 mL via INTRAVENOUS

## 2020-09-28 NOTE — Progress Notes (Signed)
[name] tolerated IV hydration with magnesium and potassium well without complaints or incident. VSS upon discharge. Pt discharged self ambulatory in satisfactory condition

## 2020-09-28 NOTE — Patient Instructions (Signed)
[facility] at [facility] °Discharge Instructions ° °Received IV hydration with magnesium and potassium today. Follow-up as scheduled ° ° °Thank you for choosing [facility] at [facility] to provide your oncology and hematology care.  To afford each patient quality time with our provider, please arrive at least 15 minutes before your scheduled appointment time.  ° °If you have a lab appointment with the Cancer Center please come in thru the Main Entrance and check in at the main information desk. ° °You need to re-schedule your appointment should you arrive 10 or more minutes late.  We strive to give you quality time with our providers, and arriving late affects you and other patients whose appointments are after yours.  Also, if you no show three or more times for appointments you may be dismissed from the clinic at the providers discretion.     °Again, thank you for choosing [facility].  Our hope is that these requests will decrease the amount of time that you wait before being seen by our physicians.       °_____________________________________________________________ ° °Should you have questions after your visit to [facility], please contact our office at [phone] and follow the prompts.  Our office hours are 8:00 a.m. and 4:30 p.m. Monday - Friday.  Please note that voicemails left after 4:00 p.m. may not be returned until the following business day.  We are closed weekends and major holidays.  You do have access to a nurse 24-7, just call the main number to the clinic [phone] and do not press any options, hold on the line and a nurse will answer the phone.   ° °For prescription refill requests, have your pharmacy contact our office and allow 72 hours.   ° °Due to Covid, you will need to wear a mask upon entering the hospital. If you do not have a mask, a mask will be given to you at the Main Entrance upon arrival. For doctor  visits, patients may have 1 support person age 18 or older with them. For treatment visits, patients can not have anyone with them due to social distancing guidelines and our immunocompromised population.  ° ° ° °

## 2020-09-30 ENCOUNTER — Ambulatory Visit (HOSPITAL_COMMUNITY): Payer: Medicaid Other

## 2020-09-30 NOTE — Progress Notes (Signed)
Nutrition Follow-up:  Patient with squamous cell carcinoma of epiglottis.  Patient receiving concurrent chemotherapy and radiation therapy.  PEG placed on 10/25.    Spoke with patient via phone for nutrition follow-up.  Patient reports that he is going to give 4 cartons of osmolite 1.5 today.  Gave 4 cartons yesterday.  Has been giving 3 sometimes 4 via PEG tube.  Reports that he can't tolerate giving 2 cartons of tube feeding at one time.  Flushing with syringe (68ml) of water before and after each feeding.  Eating orally "when I can."  Eating foods such as potted meat, vienna sausages.  Reports no taste and dry mouth.      Medications: reviewed  Labs: reviewed  Anthropometrics:   Weight 103 lb 6.4 oz on 11/29 decreased from 107 lb on 11/8   Estimated Energy Needs  Kcals: 1500-1700 Protein: 75-85 g Fluid: 1.5 L  NUTRITION DIAGNOSIS: Inadequate oral intake continues   INTERVENTION:  Recommend patient increase to 5 cartons of osmolite 1.5 per day (1 carton q 3 hours).  Flush with 60 ml of water before and after each feeding.   Tube feeding provides 1775 calories, 74.5 g protein and 1500 ml free water.  Patient eating foods high in sodium(broth based soups, gatorade, potted meat) to help with low sodium level.  Patient to continue to eat orally as tolerated in addition to giving prescribed amount of tube feeding.  Provided patient with contact number for [employer] to order more enteral supplies.   Patient has RD contact information   MONITORING, EVALUATION, GOAL: weight trends, intake, tube feeding   NEXT VISIT: Dec 10 phone f/u  Lyndal Reggio B. Zenia Resides, [place], [place] Registered Dietitian [phone] (mobile)

## 2020-10-03 ENCOUNTER — Inpatient Hospital Stay (HOSPITAL_COMMUNITY): Payer: Medicaid Other

## 2020-10-03 ENCOUNTER — Other Ambulatory Visit: Payer: Self-pay

## 2020-10-03 VITALS — BP 127/58 | HR 63 | Temp 97.2°F | Resp 18

## 2020-10-03 DIAGNOSIS — Z95828 Presence of other vascular implants and grafts: Secondary | ICD-10-CM

## 2020-10-03 DIAGNOSIS — C321 Malignant neoplasm of supraglottis: Secondary | ICD-10-CM

## 2020-10-03 DIAGNOSIS — Z5111 Encounter for antineoplastic chemotherapy: Secondary | ICD-10-CM | POA: Diagnosis not present

## 2020-10-03 LAB — CBC WITH DIFFERENTIAL/PLATELET
Abs Immature Granulocytes: 0.01 10*3/uL (ref 0.00–0.07)
Basophils Absolute: 0 10*3/uL (ref 0.0–0.1)
Basophils Relative: 1 %
Eosinophils Absolute: 0.1 10*3/uL (ref 0.0–0.5)
Eosinophils Relative: 2 %
HCT: 32.8 % — ABNORMAL LOW (ref 39.0–52.0)
Hemoglobin: 11.1 g/dL — ABNORMAL LOW (ref 13.0–17.0)
Immature Granulocytes: 0 %
Lymphocytes Relative: 15 %
Lymphs Abs: 0.6 10*3/uL — ABNORMAL LOW (ref 0.7–4.0)
MCH: 34.3 pg — ABNORMAL HIGH (ref 26.0–34.0)
MCHC: 33.8 g/dL (ref 30.0–36.0)
MCV: 101.2 fL — ABNORMAL HIGH (ref 80.0–100.0)
Monocytes Absolute: 0.9 10*3/uL (ref 0.1–1.0)
Monocytes Relative: 22 %
Neutro Abs: 2.5 10*3/uL (ref 1.7–7.7)
Neutrophils Relative %: 60 %
Platelets: 157 10*3/uL (ref 150–400)
RBC: 3.24 MIL/uL — ABNORMAL LOW (ref 4.22–5.81)
RDW: 13.4 % (ref 11.5–15.5)
WBC: 4.1 10*3/uL (ref 4.0–10.5)
nRBC: 0 % (ref 0.0–0.2)

## 2020-10-03 LAB — COMPREHENSIVE METABOLIC PANEL
ALT: 18 U/L (ref 0–44)
AST: 21 U/L (ref 15–41)
Albumin: 3.7 g/dL (ref 3.5–5.0)
Alkaline Phosphatase: 83 U/L (ref 38–126)
Anion gap: 7 (ref 5–15)
BUN: 5 mg/dL — ABNORMAL LOW (ref 6–20)
CO2: 30 mmol/L (ref 22–32)
Calcium: 9.1 mg/dL (ref 8.9–10.3)
Chloride: 89 mmol/L — ABNORMAL LOW (ref 98–111)
Creatinine, Ser: <0.3 mg/dL — ABNORMAL LOW (ref 0.61–1.24)
Glucose, Bld: 121 mg/dL — ABNORMAL HIGH (ref 70–99)
Potassium: 3.6 mmol/L (ref 3.5–5.1)
Sodium: 126 mmol/L — ABNORMAL LOW (ref 135–145)
Total Bilirubin: 0.3 mg/dL (ref 0.3–1.2)
Total Protein: 7.5 g/dL (ref 6.5–8.1)

## 2020-10-03 LAB — MAGNESIUM: Magnesium: 2 mg/dL (ref 1.7–2.4)

## 2020-10-03 MED ORDER — SODIUM CHLORIDE 0.9 % IV SOLN
Freq: Once | INTRAVENOUS | Status: AC
  Filled 2020-10-03: qty 10

## 2020-10-03 MED ORDER — SODIUM CHLORIDE 0.9 % IV SOLN
40.0000 mg/m2 | Freq: Once | INTRAVENOUS | Status: AC
  Administered 2020-10-03: 60 mg via INTRAVENOUS
  Filled 2020-10-03: qty 60

## 2020-10-03 MED ORDER — PALONOSETRON HCL INJECTION 0.25 MG/5ML
0.2500 mg | Freq: Once | INTRAVENOUS | Status: AC
  Administered 2020-10-03: 0.25 mg via INTRAVENOUS
  Filled 2020-10-03: qty 5

## 2020-10-03 MED ORDER — SODIUM CHLORIDE 0.9 % IV SOLN
150.0000 mg | Freq: Once | INTRAVENOUS | Status: AC
  Administered 2020-10-03: 150 mg via INTRAVENOUS
  Filled 2020-10-03: qty 150

## 2020-10-03 MED ORDER — HEPARIN SOD (PORK) LOCK FLUSH 100 UNIT/ML IV SOLN
500.0000 [IU] | Freq: Once | INTRAVENOUS | Status: AC | PRN
  Administered 2020-10-03: 500 [IU]

## 2020-10-03 MED ORDER — SODIUM CHLORIDE 0.9 % IV SOLN: Freq: Once | INTRAVENOUS | Status: AC

## 2020-10-03 MED ORDER — SODIUM CHLORIDE 0.9 % IV SOLN
10.0000 mg | Freq: Once | INTRAVENOUS | Status: AC
  Administered 2020-10-03: 10 mg via INTRAVENOUS
  Filled 2020-10-03: qty 10

## 2020-10-03 MED ORDER — SODIUM CHLORIDE 0.9% FLUSH
10.0000 mL | INTRAVENOUS | Status: DC | PRN
  Administered 2020-10-03: 10 mL

## 2020-10-03 NOTE — Progress Notes (Signed)
Patient presents for treatment today.  Vital signs stable.  No new complaints since last visit.  Treatment given today per MD orders.  Tolerated infusion without adverse affects.  Vital signs stable.  No complaints at this time.  Discharge from clinic ambulatory in stable condition.  Alert and oriented X 3.  Follow up with [facility] as scheduled. 

## 2020-10-03 NOTE — Progress Notes (Signed)
Patient states he did not receive radiation therapy this morning due to there was no doctor at the time for his radiation.Barnett Applebaum at the [facility] verified he had to be rescheduled today. Patient to receive radiation therapy this afternoon after chemotherapy.   Patient voided 200 mls.   Verbal order received from Dr. Chryl Heck may give post Cisplatin hydration over 1 hour. Patient to be seen for radiation therapy after treatment today at the [facility].

## 2020-10-03 NOTE — Patient Instructions (Signed)
[facility] Discharge Instructions for Patients Receiving Chemotherapy  Today you received the following chemotherapy agents   To help prevent nausea and vomiting after your treatment, we encourage you to take your nausea medication   If you develop nausea and vomiting that is not controlled by your nausea medication, call the clinic.   BELOW ARE SYMPTOMS THAT SHOULD BE REPORTED IMMEDIATELY:  *FEVER GREATER THAN 100.5 F  *CHILLS WITH OR WITHOUT FEVER  NAUSEA AND VOMITING THAT IS NOT CONTROLLED WITH YOUR NAUSEA MEDICATION  *UNUSUAL SHORTNESS OF BREATH  *UNUSUAL BRUISING OR BLEEDING  TENDERNESS IN MOUTH AND THROAT WITH OR WITHOUT PRESENCE OF ULCERS  *URINARY PROBLEMS  *BOWEL PROBLEMS  UNUSUAL RASH Items with * indicate a potential emergency and should be followed up as soon as possible.  Feel free to call the clinic should you have any questions or concerns. The clinic phone number is [phone].  Please show the CHEMO ALERT CARD at check-in to the Emergency Department and triage nurse.   

## 2020-10-04 ENCOUNTER — Encounter (HOSPITAL_COMMUNITY): Payer: Self-pay

## 2020-10-04 ENCOUNTER — Inpatient Hospital Stay (HOSPITAL_COMMUNITY): Payer: Medicaid Other

## 2020-10-04 VITALS — BP 150/73 | HR 70 | Temp 97.3°F | Resp 18

## 2020-10-04 DIAGNOSIS — C321 Malignant neoplasm of supraglottis: Secondary | ICD-10-CM

## 2020-10-04 DIAGNOSIS — Z5111 Encounter for antineoplastic chemotherapy: Secondary | ICD-10-CM | POA: Diagnosis not present

## 2020-10-04 MED ORDER — SODIUM CHLORIDE 0.9% FLUSH
10.0000 mL | INTRAVENOUS | Status: DC | PRN
  Administered 2020-10-04: 10 mL via INTRAVENOUS

## 2020-10-04 MED ORDER — SODIUM CHLORIDE 0.9 % IV SOLN
Freq: Once | INTRAVENOUS | Status: AC
  Filled 2020-10-04: qty 10

## 2020-10-04 MED ORDER — HEPARIN SOD (PORK) LOCK FLUSH 100 UNIT/ML IV SOLN
500.0000 [IU] | Freq: Once | INTRAVENOUS | Status: AC
  Administered 2020-10-04: 500 [IU] via INTRAVENOUS

## 2020-10-04 NOTE — Progress Notes (Signed)
Pt tolerated IV fluids well today.  Discharged ambulatory in stable condition.

## 2020-10-04 NOTE — Patient Instructions (Signed)
[facility] at [facility]  Discharge Instructions:   _______________________________________________________________  Thank you for choosing [facility] at [facility] to provide your oncology and hematology care.  To afford each patient quality time with our providers, please arrive at least 15 minutes before your scheduled appointment.  You need to re-schedule your appointment if you arrive 10 or more minutes late.  We strive to give you quality time with our providers, and arriving late affects you and other patients whose appointments are after yours.  Also, if you no show three or more times for appointments you may be dismissed from the clinic.  Again, thank you for choosing [facility] at [facility]. Our hope is that these requests will allow you access to exceptional care and in a timely manner. _______________________________________________________________  If you have questions after your visit, please contact our office at [phone] between the hours of 8:30 a.m. and 5:00 p.m. Voicemails left after 4:30 p.m. will not be returned until the following business day. _______________________________________________________________  For prescription refill requests, have your pharmacy contact our office. _______________________________________________________________  Recommendations made by the consultant and any test results will be sent to your referring physician. _______________________________________________________________ 

## 2020-10-05 ENCOUNTER — Other Ambulatory Visit: Payer: Self-pay

## 2020-10-05 ENCOUNTER — Inpatient Hospital Stay (HOSPITAL_COMMUNITY): Payer: Medicaid Other

## 2020-10-05 VITALS — BP 121/71 | HR 92 | Temp 97.3°F | Resp 18

## 2020-10-05 DIAGNOSIS — C321 Malignant neoplasm of supraglottis: Secondary | ICD-10-CM

## 2020-10-05 DIAGNOSIS — Z5111 Encounter for antineoplastic chemotherapy: Secondary | ICD-10-CM | POA: Diagnosis not present

## 2020-10-05 MED ORDER — SODIUM CHLORIDE 0.9% FLUSH
10.0000 mL | Freq: Once | INTRAVENOUS | Status: AC
  Administered 2020-10-05: 10 mL via INTRAVENOUS

## 2020-10-05 MED ORDER — SODIUM CHLORIDE 0.9 % IV SOLN
Freq: Once | INTRAVENOUS | Status: AC
  Filled 2020-10-05: qty 10

## 2020-10-05 MED ORDER — HEPARIN SOD (PORK) LOCK FLUSH 100 UNIT/ML IV SOLN
500.0000 [IU] | Freq: Once | INTRAVENOUS | Status: AC
  Administered 2020-10-05: 500 [IU] via INTRAVENOUS

## 2020-10-05 NOTE — Progress Notes (Signed)
Patient tolerated hydration with no complaints voiced.  Port site clean and dry with good blood return noted before and after hydration.  No bruising or swelling noted with port.  Band aid applied.  VSS with discharge and left ambulatory with no s/s of distress noted.   

## 2020-10-07 ENCOUNTER — Ambulatory Visit (HOSPITAL_COMMUNITY): Payer: Medicaid Other

## 2020-10-07 NOTE — Progress Notes (Signed)
Nutrition Follow-up:   Patient with squamous cell carcinoma of epiglottis.  Patient receiving concurrent chemotherapy and radiation therapy.  PEG placed on 10/25.  Spoke with patient via phone.  Patient reports he is taking 3-4 osmolite 1.5 via feeding tube.  Reports that he didn't want to take 5 last week due to receiving all the fluids.  Denies nausea or constipation or diarrhea.    Patient reports that he is eating orally but can't remember what he ate.     Medications: reviewed  Labs: reviewed  Anthropometrics:   Weight 109 lb 1.6 on 12/6 (fluids??) increased from 103 lb on 11/29 107 lb on 11/8   Estimated Energy Needs  Kcals: 1500-1700 Protein: 75-85 g Fluid: 1.5 L  NUTRITION DIAGNOSIS: Inadequate oral intake continues   INTERVENTION:  Discussed with patient still needs to take tube feeding if getting IV fluids.  Patient needs 5 cartons of osmolite 1.5 daily (1 carton q 3 hours). Discussed importance of getting nutrition. Eating orally as tolerated Patient has RD contact information    MONITORING, EVALUATION, GOAL: weight trends, intake, Tube feeding   NEXT VISIT: Dec 17th phone call  Elley Harp B. Zenia Resides, [place], [place] Registered Dietitian [phone] (mobile)

## 2020-10-09 NOTE — Progress Notes (Signed)
[name], [place] [address]   CLINIC:  Medical Oncology/Hematology  PCP:  Jani Gravel, MD [address] [place] [address] / [place] [address] [phone]   REASON FOR VISIT:  Follow-up for epiglottic squamous cell carcinoma  PRIOR THERAPY: None  NGS Results: Not done  CURRENT THERAPY: Concurrent chemoradiation with cisplatin weekly  BRIEF ONCOLOGIC HISTORY:  Oncology History  Squamous cell carcinoma of epiglottis ([place])  08/22/2020 Initial Diagnosis   Squamous cell carcinoma of epiglottis ([place])   09/05/2020 -  Chemotherapy   The patient had palonosetron (ALOXI) injection 0.25 mg, 0.25 mg, Intravenous,  Once, 5 of 7 cycles Administration: 0.25 mg (09/05/2020), 0.25 mg (09/12/2020), 0.25 mg (09/19/2020), 0.25 mg (09/26/2020), 0.25 mg (10/03/2020) CISplatin (PLATINOL) 60 mg in sodium chloride 0.9 % 250 mL chemo infusion, 40 mg/m2 = 60 mg, Intravenous,  Once, 5 of 7 cycles Administration: 60 mg (09/05/2020), 60 mg (09/12/2020), 60 mg (09/19/2020), 60 mg (09/26/2020), 60 mg (10/03/2020) fosaprepitant (EMEND) 150 mg in sodium chloride 0.9 % 145 mL IVPB, 150 mg, Intravenous,  Once, 5 of 7 cycles Administration: 150 mg (09/05/2020), 150 mg (09/12/2020), 150 mg (09/19/2020), 150 mg (09/26/2020), 150 mg (10/03/2020)  for chemotherapy treatment.      CANCER STAGING: Cancer Staging No matching staging information was found for the patient.  INTERVAL HISTORY:  [name], a 44 y.o. male, returns for routine follow-up and consideration for next cycle of chemotherapy. [name] was last seen on 09/26/2020.  Due for cycle #6 of cisplatin and Aloxi today.   Today he is accompanied by his family member. He complains of pain in his neck and sore throat, water burns when he tries to drink it. Besides pain, he denies any nausea, vomiting, tinnitus, hearing loss or neuropathy. He is drinking one 40 oz beer daily. Using osmolite 1.5 4 cans a day,  weight stable. He will finish radiation on 12/29.  Overall, he feels ready for next cycle of chemo today.    REVIEW OF SYSTEMS:  Review of Systems  Constitutional: Positive for fatigue (25%). Negative for appetite change.  HENT:   Positive for trouble swallowing (pain when swallowing).   Gastrointestinal: Positive for constipation.  Musculoskeletal: Positive for myalgias (8/10 generalized pain all over) and neck pain (R side neck pain).  Neurological: Positive for headaches.  All other systems reviewed and are negative.   PAST MEDICAL/SURGICAL HISTORY:  Past Medical History:  Diagnosis Date  . Back pain   . Bradycardia 08/17/2011  . Cancer ([place])   . COPD (chronic obstructive pulmonary disease) ([place])   . ETOH abuse   . GERD (gastroesophageal reflux disease)   . Macrocytosis 08/17/2011  . Pancreatitis   . Pneumonia   . Port-A-Cath in place 09/04/2020  . SAH (subarachnoid hemorrhage) ([place])   . Seizures ([place])    last one 6 mos ago   Past Surgical History:  Procedure Laterality Date  . BACK SURGERY    . ESOPHAGOGASTRODUODENOSCOPY (EGD) WITH PROPOFOL N/A 08/14/2017   Procedure: ESOPHAGOGASTRODUODENOSCOPY (EGD) WITH PROPOFOL;  Surgeon: Daneil Dolin, MD;  Location: [facility] END[facility];  Service: Endoscopy;  Laterality: N/A;  1:30pm  . ESOPHAGOGASTRODUODENOSCOPY (EGD) WITH PROPOFOL N/A 03/13/2018   Procedure: ESOPHAGOGASTRODUODENOSCOPY (EGD) WITH PROPOFOL;  Surgeon: Daneil Dolin, MD;  Location: [facility];  Service: Endoscopy;  Laterality: N/A;  2:30pm  . ESOPHAGOGASTRODUODENOSCOPY (EGD) WITH PROPOFOL N/A 08/22/2020   Procedure: ESOPHAGOGASTRODUODENOSCOPY (EGD) WITH PROPOFOL;  Surgeon: Aviva Signs, MD;  Location:  [facility] ORS;  Service: General;  Laterality: N/A;  . PEG PLACEMENT N/A 08/22/2020   Procedure: PERCUTANEOUS ENDOSCOPIC GASTROSTOMY (PEG) PLACEMENT;  Surgeon: Aviva Signs, MD;  Location: [facility] ORS;  Service: General;  Laterality: N/A;  . PORTACATH PLACEMENT Left 08/22/2020    Procedure: INSERTION PORT-A-CATH;  Surgeon: Aviva Signs, MD;  Location: [facility] ORS;  Service: General;  Laterality: Left;  . SEPTOPLASTY      SOCIAL HISTORY:  Social History   Socioeconomic History  . Marital status: Single    Spouse name: Not on file  . Number of children: Not on file  . Years of education: Not on file  . Highest education level: Not on file  Occupational History  . Not on file  Tobacco Use  . Smoking status: Current Every Day Smoker    Packs/day: 1.00    Years: 20.00    Pack years: 20.00    Types: Cigarettes  . Smokeless tobacco: Never Used  Vaping Use  . Vaping Use: Never used  Substance and Sexual Activity  . Alcohol use: Yes    Comment: couple of beers daily  . Drug use: Not Currently    Types: Marijuana    Comment: months   . Sexual activity: Not Currently  Other Topics Concern  . Not on file  Social History Narrative  . Not on file   Social Determinants of Health   Financial Resource Strain: Low Risk   . Difficulty of Paying Living Expenses: Not very hard  Food Insecurity: No Food Insecurity  . Worried About Charity fundraiser in the Last Year: Never true  . Ran Out of Food in the Last Year: Never true  Transportation Needs: No Transportation Needs  . Lack of Transportation (Medical): No  . Lack of Transportation (Non-Medical): No  Physical Activity: Sufficiently Active  . Days of Exercise per Week: 7 days  . Minutes of Exercise per Session: 60 min  Stress: Stress Concern Present  . Feeling of Stress : To some extent  Social Connections: Moderately Isolated  . Frequency of Communication with Friends and Family: More than three times a week  . Frequency of Social Gatherings with Friends and Family: More than three times a week  . Attends Religious Services: Never  . Active Member of Clubs or Organizations: No  . Attends Archivist Meetings: Never  . Marital Status: Living with partner  Intimate Partner Violence: Not At Risk  .  Fear of Current or Ex-Partner: No  . Emotionally Abused: No  . Physically Abused: No  . Sexually Abused: No    FAMILY HISTORY:  Family History  Problem Relation Age of Onset  . Coronary artery disease Mother        deceased age 13  . Seizures Father   . Alcohol abuse Father   . Colon cancer Neg Hx     CURRENT MEDICATIONS:  Current Outpatient Medications  Medication Sig Dispense Refill  . albuterol (PROVENTIL HFA;VENTOLIN HFA) 108 (90 BASE) MCG/ACT inhaler Inhale 2 puffs into the lungs every 6 (six) hours as needed for wheezing or shortness of breath.    Marland Kitchen albuterol (PROVENTIL) (2.5 MG/3ML) 0.083% nebulizer solution Take 2.5 mg by nebulization every 6 (six) hours as needed for wheezing or shortness of breath.    Marland Kitchen amLODipine (NORVASC) 10 MG tablet Take 10 mg by mouth daily.     . carbamazepine (TEGRETOL) 200 MG tablet Take 400 mg by mouth 2 (two) times daily.    . chlordiazePOXIDE (  LIBRIUM) 25 MG capsule Take 1 capsule (25 mg total) by mouth every 6 (six) hours. 30 capsule 0  . CISPLATIN IV Inject into the vein once a week.    Marland Kitchen dexamethasone 0.5 MG/5ML elixir Take by mouth.    . diclofenac Sodium (VOLTAREN) 1 % GEL Apply 2 g topically daily as needed (pain).     Marland Kitchen diphenhydrAMINE (BENADRYL) 12.5 MG/5ML elixir Take 10 mLs by mouth in the morning, at noon, in the evening, and at bedtime.    . gabapentin (NEURONTIN) 300 MG capsule Take 300 mg by mouth 3 (three) times daily.    Marland Kitchen levETIRAcetam (KEPPRA) 500 MG tablet Take 1,000 mg by mouth 2 (two) times daily.     Marland Kitchen lidocaine-prilocaine (EMLA) cream Apply small amount to port a cath site and cover with plastic wrap 1 hour prior to chemotherapy appointments 30 g 3  . oxyCODONE 10 MG TABS Take 1 tablet (10 mg total) by mouth every 4 (four) hours as needed for severe pain. 180 tablet 0  . pantoprazole (PROTONIX) 40 MG tablet Take 1 tablet (40 mg total) by mouth 2 (two) times daily before a meal. 60 tablet 5  . prochlorperazine (COMPAZINE) 10  MG tablet Take 1 tablet (10 mg total) by mouth every 6 (six) hours as needed for nausea or vomiting. 30 tablet 2   No current facility-administered medications for this visit.    ALLERGIES:  No Known Allergies  PHYSICAL EXAM:  Performance status (ECOG): 1 - Symptomatic but completely ambulatory  There were no vitals filed for this visit. Wt Readings from Last 3 Encounters:  10/03/20 109 lb 1.6 oz (49.5 kg)  09/26/20 103 lb 6.4 oz (46.9 kg)  09/19/20 102 lb 3.2 oz (46.4 kg)   Physical Exam Vitals reviewed.  Constitutional:      Appearance: Normal appearance.  HENT:     Mouth/Throat:     Lips: No lesions.     Mouth: No oral lesions.     Tongue: No lesions.  Cardiovascular:     Rate and Rhythm: Normal rate and regular rhythm.     Pulses: Normal pulses.     Heart sounds: Normal heart sounds.  Pulmonary:     Effort: Pulmonary effort is normal.     Breath sounds: Normal breath sounds.  Chest:     Comments: Port-a-Cath in L chest Abdominal:     Tenderness: There is no abdominal tenderness (PEg site looks clean).     Comments: PEG tube  Musculoskeletal:     Right lower leg: No edema.     Left lower leg: No edema.  Lymphadenopathy:     Cervical: Cervical adenopathy: R neck mass, skin over the neck angry but no evidence of infection.  Neurological:     General: No focal deficit present.     Mental Status: He is alert and oriented to person, place, and time.  Psychiatric:        Mood and Affect: Mood normal.        Behavior: Behavior normal.     LABORATORY DATA:  I have reviewed the labs as listed.  CBC Latest Ref Rng & Units 10/03/2020 09/26/2020 09/19/2020  WBC 4.0 - 10.5 K/uL 4.1 5.3 5.2  Hemoglobin 13.0 - 17.0 g/dL 11.1(L) 11.6(L) 11.8(L)  Hematocrit 39.0 - 52.0 % 32.8(L) 33.7(L) 34.7(L)  Platelets 150 - 400 K/uL 157 240 281   CMP Latest Ref Rng & Units 10/03/2020 09/26/2020 09/19/2020  Glucose 70 - 99 mg/dL 121(H) 112(H)  109(H)  BUN 6 - 20 mg/dL 5(L) <5(L) <5(L)   Creatinine 0.61 - 1.24 mg/dL <0.30(L) <0.30(L) <0.30(L)  Sodium 135 - 145 mmol/L 126(L) 123(L) 126(L)  Potassium 3.5 - 5.1 mmol/L 3.6 3.7 3.6  Chloride 98 - 111 mmol/L 89(L) 84(L) 89(L)  CO2 22 - 32 mmol/L 30 29 26   Calcium 8.9 - 10.3 mg/dL 9.1 9.1 9.1  Total Protein 6.5 - 8.1 g/dL 7.5 8.0 7.7  Total Bilirubin 0.3 - 1.2 mg/dL 0.3 0.5 0.1(L)  Alkaline Phos 38 - 126 U/L 83 74 75  AST 15 - 41 U/L 21 21 20   ALT 0 - 44 U/L 18 18 18     DIAGNOSTIC IMAGING:  I have independently reviewed the scans and discussed with the patient. No results found.   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 -Ultrasound of the neck on 04/28/2020 with 2.8 x 1.7 cm mass on the right neck and several relatively small hypoechoic lymph nodes in the left side. -Weight loss of 22 pounds in the last 3 months. No fevers or night sweats. -Positive for dysphagia. -PET CT scan on 08/05/2020 shows epiglottis is thickened and irregular with hypermetabolic. Left level 2 lymph node positive. Right-sided level 2 nodal metastasis measuring 2.7 x 2.2 cm. Bilateral areas of peribronchovascular reticulonodular opacities with hypermetabolism favoring infection. No extra cervical metastatic disease. Thoracic nodes with low-level of hypermetabolism favored to be reactive. -Fiberoptic exam by Dr. Benjamine Mola on 08/02/2020 showed tongue base mass noted that involved the superior aspect of the epiglottis. Arytenoid mucosa was edematous with slight erythema. True vocal cords were pale yellow and edematous but without mass or lesion. -FNA of the right neck lymph node on 08/02/2020 consistent with squamous cell carcinoma.  2. Social/family history: -Worked in the roofing, now on disability. -1 pack/day for 30 years, active smoker. -3 beers per day for the last 10 years.   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  No ringing in the ears or neuropathy. -Labs pending - Ok to proceed with chemo if labs are within parameters  2. Weight loss stable, gained 2 lbs. -He is taking in 4 cans of Osmolite 1.5. -Encouraged to continue this.  3. Right neck pain: -Continue oxycodone 10 mg every 4 hours as needed.  4. Ongoing EtOH use: -Currently drinking a can of 40 ounce of beer daily. -Encouraged to cut it down.  5. Encouraged smoking cessation Orders placed this encounter:  No orders of the defined types were placed in this encounter.  Time  I spent 30 minutes in the care of this patient, please do not hesitate to contact us with any additional questions or concerns.  Benay Pike MD

## 2020-10-10 ENCOUNTER — Inpatient Hospital Stay (HOSPITAL_COMMUNITY): Payer: Medicaid Other

## 2020-10-10 ENCOUNTER — Encounter (HOSPITAL_COMMUNITY): Payer: Self-pay | Admitting: Hematology and Oncology

## 2020-10-10 ENCOUNTER — Other Ambulatory Visit (HOSPITAL_COMMUNITY): Payer: Self-pay | Admitting: Surgery

## 2020-10-10 ENCOUNTER — Other Ambulatory Visit: Payer: Self-pay

## 2020-10-10 ENCOUNTER — Inpatient Hospital Stay (HOSPITAL_BASED_OUTPATIENT_CLINIC_OR_DEPARTMENT_OTHER): Payer: Medicaid Other | Admitting: Hematology and Oncology

## 2020-10-10 VITALS — BP 128/66 | HR 75 | Temp 97.5°F | Resp 18 | Wt 105.9 lb

## 2020-10-10 DIAGNOSIS — Z5111 Encounter for antineoplastic chemotherapy: Secondary | ICD-10-CM | POA: Diagnosis not present

## 2020-10-10 DIAGNOSIS — C321 Malignant neoplasm of supraglottis: Secondary | ICD-10-CM

## 2020-10-10 DIAGNOSIS — Z95828 Presence of other vascular implants and grafts: Secondary | ICD-10-CM

## 2020-10-10 LAB — COMPREHENSIVE METABOLIC PANEL
ALT: 14 U/L (ref 0–44)
AST: 19 U/L (ref 15–41)
Albumin: 3.5 g/dL (ref 3.5–5.0)
Alkaline Phosphatase: 83 U/L (ref 38–126)
Anion gap: 7 (ref 5–15)
BUN: 6 mg/dL (ref 6–20)
CO2: 29 mmol/L (ref 22–32)
Calcium: 8.7 mg/dL — ABNORMAL LOW (ref 8.9–10.3)
Chloride: 88 mmol/L — ABNORMAL LOW (ref 98–111)
Creatinine, Ser: <0.3 mg/dL — ABNORMAL LOW (ref 0.61–1.24)
Glucose, Bld: 106 mg/dL — ABNORMAL HIGH (ref 70–99)
Potassium: 3.8 mmol/L (ref 3.5–5.1)
Sodium: 124 mmol/L — ABNORMAL LOW (ref 135–145)
Total Bilirubin: 0.4 mg/dL (ref 0.3–1.2)
Total Protein: 7.1 g/dL (ref 6.5–8.1)

## 2020-10-10 LAB — CBC WITH DIFFERENTIAL/PLATELET
Abs Immature Granulocytes: 0.02 10*3/uL (ref 0.00–0.07)
Basophils Absolute: 0 10*3/uL (ref 0.0–0.1)
Basophils Relative: 0 %
Eosinophils Absolute: 0.1 10*3/uL (ref 0.0–0.5)
Eosinophils Relative: 2 %
HCT: 29.5 % — ABNORMAL LOW (ref 39.0–52.0)
Hemoglobin: 10.2 g/dL — ABNORMAL LOW (ref 13.0–17.0)
Immature Granulocytes: 1 %
Lymphocytes Relative: 13 %
Lymphs Abs: 0.5 10*3/uL — ABNORMAL LOW (ref 0.7–4.0)
MCH: 34.6 pg — ABNORMAL HIGH (ref 26.0–34.0)
MCHC: 34.6 g/dL (ref 30.0–36.0)
MCV: 100 fL (ref 80.0–100.0)
Monocytes Absolute: 0.7 10*3/uL (ref 0.1–1.0)
Monocytes Relative: 20 %
Neutro Abs: 2.4 10*3/uL (ref 1.7–7.7)
Neutrophils Relative %: 64 %
Platelets: 152 10*3/uL (ref 150–400)
RBC: 2.95 MIL/uL — ABNORMAL LOW (ref 4.22–5.81)
RDW: 13.9 % (ref 11.5–15.5)
WBC: 3.7 10*3/uL — ABNORMAL LOW (ref 4.0–10.5)
nRBC: 0 % (ref 0.0–0.2)

## 2020-10-10 LAB — MAGNESIUM: Magnesium: 1.9 mg/dL (ref 1.7–2.4)

## 2020-10-10 MED ORDER — SODIUM CHLORIDE 0.9 % IV SOLN
40.0000 mg/m2 | Freq: Once | INTRAVENOUS | Status: AC
  Administered 2020-10-10: 13:00:00 60 mg via INTRAVENOUS
  Filled 2020-10-10: qty 60

## 2020-10-10 MED ORDER — SODIUM CHLORIDE 0.9 % IV SOLN
10.0000 mg | Freq: Once | INTRAVENOUS | Status: AC
  Administered 2020-10-10: 12:00:00 10 mg via INTRAVENOUS
  Filled 2020-10-10: qty 10

## 2020-10-10 MED ORDER — SODIUM CHLORIDE 0.9 % IV SOLN
Freq: Once | INTRAVENOUS | Status: AC
  Filled 2020-10-10: qty 10

## 2020-10-10 MED ORDER — OXYCODONE HCL 10 MG PO TABS: 10.0000 mg | ORAL_TABLET | ORAL | 0 refills | Status: DC | PRN

## 2020-10-10 MED ORDER — SODIUM CHLORIDE 0.9 % IV SOLN
150.0000 mg | Freq: Once | INTRAVENOUS | Status: AC
  Administered 2020-10-10: 12:00:00 150 mg via INTRAVENOUS
  Filled 2020-10-10: qty 60

## 2020-10-10 MED ORDER — HEPARIN SOD (PORK) LOCK FLUSH 100 UNIT/ML IV SOLN
500.0000 [IU] | Freq: Once | INTRAVENOUS | Status: AC | PRN
  Administered 2020-10-10: 15:00:00 500 [IU]

## 2020-10-10 MED ORDER — SODIUM CHLORIDE 0.9 % IV SOLN
Freq: Once | INTRAVENOUS | Status: AC
Start: 2020-10-10 — End: 2020-10-10

## 2020-10-10 MED ORDER — OXYCODONE HCL 5 MG PO TABS
10.0000 mg | ORAL_TABLET | Freq: Once | ORAL | Status: AC
  Administered 2020-10-10: 10:00:00 10 mg via ORAL
  Filled 2020-10-10: qty 2

## 2020-10-10 MED ORDER — SODIUM CHLORIDE 0.9% FLUSH
10.0000 mL | INTRAVENOUS | Status: DC | PRN
  Administered 2020-10-10: 10:00:00 10 mL

## 2020-10-10 MED ORDER — PALONOSETRON HCL INJECTION 0.25 MG/5ML
0.2500 mg | Freq: Once | INTRAVENOUS | Status: AC
  Administered 2020-10-10: 12:00:00 0.25 mg via INTRAVENOUS
  Filled 2020-10-10: qty 5

## 2020-10-10 NOTE — Progress Notes (Signed)
[name] presents today for D1C6 Cisplatin. Pt denies any new changes or symptoms since last treatment. Lab results and vitals have been reviewed and are stable and within parameters for treatment. Patient has been assessed by Dr. Chryl Heck who has approved proceeding with treatment today as planned.  Infusions tolerated without incident or complaint. VSS upon completion of treatment. Port flushed and left accessed per protocol for use tomorrow, see MAR and IV flowsheet for details. Discharged in satisfactory condition with follow up instructions.

## 2020-10-10 NOTE — Patient Instructions (Signed)
[facility] Discharge Instructions for Patients Receiving Chemotherapy   Beginning January 23rd 2017 lab work for the Cancer Center will be done in the  Main lab at [facility] on 1st floor. If you have a lab appointment with the Cancer Center please come in thru the  Main Entrance and check in at the main information desk   Today you received the following chemotherapy agents Cisplatin  To help prevent nausea and vomiting after your treatment, we encourage you to take your nausea medication     If you develop nausea and vomiting, or diarrhea that is not controlled by your medication, call the clinic.  The clinic phone number is [phone]. Office hours are Monday-Friday 8:30am-5:00pm.  BELOW ARE SYMPTOMS THAT SHOULD BE REPORTED IMMEDIATELY:  *FEVER GREATER THAN 101.0 F  *CHILLS WITH OR WITHOUT FEVER  NAUSEA AND VOMITING THAT IS NOT CONTROLLED WITH YOUR NAUSEA MEDICATION  *UNUSUAL SHORTNESS OF BREATH  *UNUSUAL BRUISING OR BLEEDING  TENDERNESS IN MOUTH AND THROAT WITH OR WITHOUT PRESENCE OF ULCERS  *URINARY PROBLEMS  *BOWEL PROBLEMS  UNUSUAL RASH Items with * indicate a potential emergency and should be followed up as soon as possible. If you have an emergency after office hours please contact your primary care physician or go to the nearest emergency department.  Please call the clinic during office hours if you have any questions or concerns.   You may also contact the Patient Navigator at [phone] should you have any questions or need assistance in obtaining follow up care.      Resources For Cancer Patients and their Caregivers ? American Cancer Society: Can assist with transportation, wigs, general needs, runs Look Good Feel Better.        1-888-227-6333 ? Cancer Care: Provides financial assistance, online support groups, medication/co-pay assistance.  1-800-813-HOPE (4673) ? [facility] Assists [place]  cancer patients and their families through emotional , educational and financial support.  336-427-4357 ? [place] DSS Where to apply for food stamps, Medicaid and utility assistance. 336-342-1394 ? [facility]: Transportation to medical appointments. 336-347-2287 ? Social Security Administration: May apply for disability if have a Stage IV cancer. 336-342-7796 1-800-772-1213 ? [place] Aging, Disability and Transit Services: Assists with nutrition, care and transit needs. 336-349-2343         

## 2020-10-11 ENCOUNTER — Inpatient Hospital Stay (HOSPITAL_COMMUNITY): Payer: Medicaid Other

## 2020-10-11 VITALS — BP 100/65 | HR 91 | Temp 97.8°F | Resp 18

## 2020-10-11 DIAGNOSIS — Z5111 Encounter for antineoplastic chemotherapy: Secondary | ICD-10-CM | POA: Diagnosis not present

## 2020-10-11 DIAGNOSIS — C321 Malignant neoplasm of supraglottis: Secondary | ICD-10-CM

## 2020-10-11 MED ORDER — SODIUM CHLORIDE 0.9 % IV SOLN
Freq: Once | INTRAVENOUS | Status: AC
  Filled 2020-10-11: qty 10

## 2020-10-11 MED ORDER — HEPARIN SOD (PORK) LOCK FLUSH 100 UNIT/ML IV SOLN
500.0000 [IU] | Freq: Once | INTRAVENOUS | Status: AC
  Administered 2020-10-11: 500 [IU] via INTRAVENOUS

## 2020-10-11 MED ORDER — SODIUM CHLORIDE 0.9% FLUSH
10.0000 mL | INTRAVENOUS | Status: DC | PRN
  Administered 2020-10-11: 10 mL via INTRAVENOUS

## 2020-10-11 NOTE — Patient Instructions (Signed)
[facility] at [facility]  Discharge Instructions:   _______________________________________________________________  Thank you for choosing [facility] at [facility] to provide your oncology and hematology care.  To afford each patient quality time with our providers, please arrive at least 15 minutes before your scheduled appointment.  You need to re-schedule your appointment if you arrive 10 or more minutes late.  We strive to give you quality time with our providers, and arriving late affects you and other patients whose appointments are after yours.  Also, if you no show three or more times for appointments you may be dismissed from the clinic.  Again, thank you for choosing [facility] at [facility]. Our hope is that these requests will allow you access to exceptional care and in a timely manner. _______________________________________________________________  If you have questions after your visit, please contact our office at [phone] between the hours of 8:30 a.m. and 5:00 p.m. Voicemails left after 4:30 p.m. will not be returned until the following business day. _______________________________________________________________  For prescription refill requests, have your pharmacy contact our office. _______________________________________________________________  Recommendations made by the consultant and any test results will be sent to your referring physician. _______________________________________________________________ 

## 2020-10-11 NOTE — Progress Notes (Signed)
Pt here today for house fluids. Tolerated fluids well today without incidence.  Discharged in stable condition ambulatory.

## 2020-10-12 ENCOUNTER — Other Ambulatory Visit: Payer: Self-pay

## 2020-10-12 ENCOUNTER — Inpatient Hospital Stay (HOSPITAL_COMMUNITY): Payer: Medicaid Other

## 2020-10-12 VITALS — BP 103/49 | HR 71 | Temp 97.0°F | Resp 18

## 2020-10-12 DIAGNOSIS — Z5111 Encounter for antineoplastic chemotherapy: Secondary | ICD-10-CM | POA: Diagnosis not present

## 2020-10-12 DIAGNOSIS — C321 Malignant neoplasm of supraglottis: Secondary | ICD-10-CM

## 2020-10-12 MED ORDER — SODIUM CHLORIDE 0.9 % IV SOLN: Freq: Once | INTRAVENOUS | Status: AC

## 2020-10-12 NOTE — Progress Notes (Signed)
Patient here today for IV fluids.  Patient tolerated fluids well.  Patient's port was flushed with good blood return noted.  VSS and patient left ambulatory in stable condition.

## 2020-10-14 ENCOUNTER — Encounter (HOSPITAL_COMMUNITY): Payer: Self-pay

## 2020-10-14 ENCOUNTER — Encounter (HOSPITAL_COMMUNITY): Payer: Medicaid Other

## 2020-10-14 NOTE — Progress Notes (Signed)
Nutrition  Called patient for scheduled nutrition phone follow-up.  No answer. Left message with call back number.  Habiba Treloar B. Zenia Resides, [place], [place] Registered Dietitian [phone] (mobile)

## 2020-10-14 NOTE — Progress Notes (Signed)
[facility] [address], [place] [address]  Clinic Progress Note   BEECHER FURIO [id number] 04-Aug-1976 44 y.o.  [name] is managed by Derek Jack, MD  Actively treated with chemotherapy/immunotherapy/hormonal therapy: yes  Current therapy: Concurrent chemoradiation with cisplatin  Last treated: 10/10/2020 (cycle 6)  Next scheduled appointment with provider: To be scheduled Assessment: Plan:    Squamous cell carcinoma of epiglottis (HCC) - Plan: oxyCODONE (Oxy IR/ROXICODONE) immediate release tablet 5 mg, DISCONTINUED: oxyCODONE (Oxy IR/ROXICODONE) immediate release tablet 5 mg  Hyponatremia   Squamous cell carcinoma of the epiglottis: Mr. Wey presents to the clinic today for consideration of cycle 7 of cisplatin which is given with concurrent chemoradiation.  He is scheduled to return on 10/19/2020 for IV fluids.  He will be seen next in for cycle 8 of chemotherapy.  He is scheduled to complete radiation next week.  Hyponatremia: The patient's labs returned today with a sodium of 118.  Based on this the patient was directly admitted to [facility] under the care of Dr. Shanon Brow Tat.  Please see After Visit Summary for patient specific instructions.  Future Appointments  Date Time Provider [place]  10/18/2020 11:00 AM [facility] CHAIR 1 [facility] None  10/19/2020 11:00 AM [facility] CHAIR 1 [facility] None  11/01/2020  1:00 PM [facility] INJ NURSE [facility] None  11/02/2020  4:00 PM Derek Jack, MD [facility] None  11/04/2020  2:45 PM Jennet Maduro, RD [facility] None    No orders of the defined types were placed in this encounter.      Subjective:   Patient ID:  [name] is a 44 y.o. (DOB 1975/11/24) male.  Chief Complaint:  Chief Complaint  Patient presents with  . Follow-up    HPI [name]  is a 44 y.o. male with a diagnosis of a squamous cell carcinoma of the epiglottis.  He is followed by  Derek Jack and presents to the clinic today for consideration of cycle 7 of cisplatin which is given with concurrent chemoradiation.  He reports his last radiation treatment is scheduled for later next week.  He reports a cough, shortness of breath, headaches, and throat pain.  He had nausea and vomited last evening after coughing for an extended period of time.  He denies fevers, chills, or sweats.  He continues to get the majority of his nutrition through his PEG tube.  His weight today was noted to be 103.8 pounds.  This is down from 1 week ago when his weight was noted to be 105 pounds 14.4 ounces.  He reports that his energy level is approximately a 25% and that his appetite is only half of what it should be.  He reports pain in his face today at 8/10.  His oncologic history is as follows:  BRIEF ONCOLOGIC HISTORY:     Oncology History  Squamous cell carcinoma of epiglottis ([place])  08/22/2020 Initial Diagnosis   Squamous cell carcinoma of epiglottis (HCC)   09/05/2020 -  Chemotherapy   The patient had palonosetron (ALOXI) injection 0.25 mg, 0.25 mg, Intravenous,  Once, 5 of 7 cycles Administration: 0.25 mg (09/05/2020), 0.25 mg (09/12/2020), 0.25 mg (09/19/2020), 0.25 mg (09/26/2020), 0.25 mg (10/03/2020) CISplatin (PLATINOL) 60 mg in sodium chloride 0.9 % 250 mL chemo infusion, 40 mg/m2 = 60 mg, Intravenous,  Once, 5 of 7 cycles Administration: 60 mg (09/05/2020), 60 mg (09/12/2020), 60 mg (09/19/2020), 60 mg (09/26/2020), 60 mg (10/03/2020) fosaprepitant (EMEND) 150 mg  in sodium chloride 0.9 % 145 mL IVPB, 150 mg, Intravenous,  Once, 5 of 7 cycles Administration: 150 mg (09/05/2020), 150 mg (09/12/2020), 150 mg (09/19/2020), 150 mg (09/26/2020), 150 mg (10/03/2020)  for chemotherapy treatment.     Medications: I have reviewed the patient's current medications.  Allergies: No Known Allergies  Past Medical History:  Diagnosis Date  . Back pain   . Bradycardia 08/17/2011  .  Cancer ([place])   . COPD (chronic obstructive pulmonary disease) ([place])   . ETOH abuse   . GERD (gastroesophageal reflux disease)   . Macrocytosis 08/17/2011  . Pancreatitis   . Pneumonia   . Port-A-Cath in place 09/04/2020  . SAH (subarachnoid hemorrhage) ([place])   . Seizures ([place])    last one 6 mos ago    Past Surgical History:  Procedure Laterality Date  . BACK SURGERY    . ESOPHAGOGASTRODUODENOSCOPY (EGD) WITH PROPOFOL N/A 08/14/2017   Procedure: ESOPHAGOGASTRODUODENOSCOPY (EGD) WITH PROPOFOL;  Surgeon: Daneil Dolin, MD;  [facility];  Service: Endoscopy;  Laterality: N/A;  1:30pm  . ESOPHAGOGASTRODUODENOSCOPY (EGD) WITH PROPOFOL N/A 03/13/2018   Procedure: ESOPHAGOGASTRODUODENOSCOPY (EGD) WITH PROPOFOL;  Surgeon: Daneil Dolin, MD;  [facility];  Service: Endoscopy;  Laterality: N/A;  2:30pm  . ESOPHAGOGASTRODUODENOSCOPY (EGD) WITH PROPOFOL N/A 08/22/2020   Procedure: ESOPHAGOGASTRODUODENOSCOPY (EGD) WITH PROPOFOL;  Surgeon: Aviva Signs, MD;  Location: [facility] ORS;  Service: General;  Laterality: N/A;  . PEG PLACEMENT N/A 08/22/2020   Procedure: PERCUTANEOUS ENDOSCOPIC GASTROSTOMY (PEG) PLACEMENT;  Surgeon: Aviva Signs, MD;  Location: [facility];  Service: General;  Laterality: N/A;  . PORTACATH PLACEMENT Left 08/22/2020   Procedure: INSERTION PORT-A-CATH;  Surgeon: Aviva Signs, MD;  Location: [facility];  Service: General;  Laterality: Left;  . SEPTOPLASTY      Family History  Problem Relation Age of Onset  . Coronary artery disease Mother        deceased age 39  . Seizures Father   . Alcohol abuse Father   . Colon cancer Neg Hx     Social History   Socioeconomic History  . Marital status: Single    Spouse name: Not on file  . Number of children: Not on file  . Years of education: Not on file  . Highest education level: Not on file  Occupational History  . Not on file  Tobacco Use  . Smoking status: Current Every Day Smoker    Packs/day: 1.00     Years: 20.00    Pack years: 20.00    Types: Cigarettes  . Smokeless tobacco: Never Used  Vaping Use  . Vaping Use: Never used  Substance and Sexual Activity  . Alcohol use: Yes    Comment: couple of beers daily  . Drug use: Not Currently    Types: Marijuana    Comment: months   . Sexual activity: Not Currently  Other Topics Concern  . Not on file  Social History Narrative  . Not on file   Social Determinants of Health   Financial Resource Strain: Low Risk   . Difficulty of Paying Living Expenses: Not very hard  Food Insecurity: No Food Insecurity  . Worried About Charity fundraiser in the Last Year: Never true  . Ran Out of Food in the Last Year: Never true  Transportation Needs: No Transportation Needs  . Lack of Transportation (Medical): No  . Lack of Transportation (Non-Medical): No  Physical Activity: Sufficiently Active  . Days of Exercise  per Week: 7 days  . Minutes of Exercise per Session: 60 min  Stress: Stress Concern Present  . Feeling of Stress : To some extent  Social Connections: Moderately Isolated  . Frequency of Communication with Friends and Family: More than three times a week  . Frequency of Social Gatherings with Friends and Family: More than three times a week  . Attends Religious Services: Never  . Active Member of Clubs or Organizations: No  . Attends Archivist Meetings: Never  . Marital Status: Living with partner  Intimate Partner Violence: Not At Risk  . Fear of Current or Ex-Partner: No  . Emotionally Abused: No  . Physically Abused: No  . Sexually Abused: No    Past Medical History, Surgical history, Social history, and Family history were reviewed and updated as appropriate.   Please see review of systems for further details on the patient's review from today.   Review of Systems:  Review of Systems  Constitutional: Positive for appetite change and fatigue. Negative for chills, diaphoresis and fever.  HENT: Positive for  sore throat and trouble swallowing. Negative for voice change.   Respiratory: Positive for cough and shortness of breath. Negative for chest tightness and wheezing.   Cardiovascular: Negative for chest pain and palpitations.  Gastrointestinal: Positive for nausea and vomiting. Negative for abdominal pain, constipation and diarrhea.  Musculoskeletal: Negative for back pain and myalgias.  Neurological: Positive for headaches. Negative for dizziness and light-headedness.  Psychiatric/Behavioral: Positive for sleep disturbance.    Objective:   Physical Exam:  BP 131/63 (BP Location: Right Arm, Patient Position: Sitting)   Pulse 77   Temp (!) 97.3 F (36.3 C) (Temporal)   Resp 18   Wt 103 lb 12.8 oz (47.1 kg)   SpO2 100%   BMI 16.26 kg/m  ECOG: 0  Physical Exam Constitutional:      General: He is not in acute distress.    Appearance: He is not ill-appearing.  HENT:     Head: Normocephalic and atraumatic.  Eyes:     General: No scleral icterus.       Right eye: No discharge.        Left eye: No discharge.     Conjunctiva/sclera: Conjunctivae normal.  Cardiovascular:     Rate and Rhythm: Normal rate and regular rhythm.     Heart sounds: No murmur heard. No friction rub. No gallop.   Pulmonary:     Effort: Pulmonary effort is normal. No respiratory distress.     Breath sounds: Examination of the right-upper field reveals wheezing. Examination of the left-upper field reveals wheezing. Examination of the right-middle field reveals wheezing. Examination of the left-middle field reveals wheezing. Examination of the right-lower field reveals wheezing. Examination of the left-lower field reveals wheezing. Wheezing present. No rhonchi or rales.  Abdominal:     General: Bowel sounds are normal. There is no distension.     Tenderness: There is no abdominal tenderness. There is no guarding.    Musculoskeletal:     Right lower leg: No edema.     Left lower leg: No edema.  Skin:     Coloration: Skin is not jaundiced.     Findings: No erythema or lesion.  Neurological:     Mental Status: He is alert.     Coordination: Coordination normal.     Lab Review:     Component Value Date/Time   NA 118 (LL) 10/17/2020 0923   K 3.8 10/17/2020 4599  CL 83 (L) 10/17/2020 0923   CO2 26 10/17/2020 0923   GLUCOSE 97 10/17/2020 0923   BUN 5 (L) 10/17/2020 0923   CREATININE <0.30 (L) 10/17/2020 0923   CALCIUM 8.7 (L) 10/17/2020 0923   CALCIUM 11.0 (H) 10/08/2016 0547   PROT 7.4 10/17/2020 0923   ALBUMIN 3.8 10/17/2020 0923   AST 19 10/17/2020 0923   ALT 15 10/17/2020 0923   ALKPHOS 92 10/17/2020 0923   BILITOT 0.3 10/17/2020 0923   GFRNONAA NOT CALCULATED 10/17/2020 0923   GFRAA >60 09/04/2019 0930       Component Value Date/Time   WBC 5.5 10/17/2020 0923   RBC 2.86 (L) 10/17/2020 0923   HGB 10.2 (L) 10/17/2020 0923   HCT 28.5 (L) 10/17/2020 0923   PLT 132 (L) 10/17/2020 0923   MCV 99.7 10/17/2020 0923   MCH 35.7 (H) 10/17/2020 0923   MCHC 35.8 10/17/2020 0923   RDW 14.7 10/17/2020 0923   LYMPHSABS 0.6 (L) 10/17/2020 0923   MONOABS 0.9 10/17/2020 0923   EOSABS 0.1 10/17/2020 0923   BASOSABS 0.0 10/17/2020 0923   -------------------------------  Imaging from last 24 hours (if applicable):  Radiology interpretation: No results found.      This case was discussed with Dr. Shanon Brow Tat who graciously agreed to accept Mr. Stead as a direct admission.

## 2020-10-17 ENCOUNTER — Inpatient Hospital Stay (HOSPITAL_COMMUNITY): Payer: Medicaid Other

## 2020-10-17 ENCOUNTER — Other Ambulatory Visit: Payer: Self-pay

## 2020-10-17 ENCOUNTER — Inpatient Hospital Stay (HOSPITAL_COMMUNITY)
Admission: AD | Admit: 2020-10-17 | Discharge: 2020-10-19 | DRG: 644 | Disposition: A | Discharge status: Home / Self Care | Payer: Medicaid Other | Source: Ambulatory Visit | Attending: Family Medicine | Admitting: Family Medicine

## 2020-10-17 ENCOUNTER — Other Ambulatory Visit: Payer: Self-pay | Admitting: Medical

## 2020-10-17 ENCOUNTER — Inpatient Hospital Stay (HOSPITAL_BASED_OUTPATIENT_CLINIC_OR_DEPARTMENT_OTHER): Payer: Medicaid Other | Admitting: Medical

## 2020-10-17 VITALS — BP 109/58 | HR 68 | Temp 97.5°F | Resp 18

## 2020-10-17 VITALS — BP 131/63 | HR 77 | Temp 97.3°F | Resp 18 | Wt 103.8 lb

## 2020-10-17 DIAGNOSIS — E871 Hypo-osmolality and hyponatremia: Secondary | ICD-10-CM | POA: Diagnosis not present

## 2020-10-17 DIAGNOSIS — E861 Hypovolemia: Secondary | ICD-10-CM | POA: Diagnosis present

## 2020-10-17 DIAGNOSIS — M542 Cervicalgia: Secondary | ICD-10-CM | POA: Diagnosis present

## 2020-10-17 DIAGNOSIS — Z79899 Other long term (current) drug therapy: Secondary | ICD-10-CM | POA: Diagnosis not present

## 2020-10-17 DIAGNOSIS — Z95828 Presence of other vascular implants and grafts: Secondary | ICD-10-CM

## 2020-10-17 DIAGNOSIS — C321 Malignant neoplasm of supraglottis: Secondary | ICD-10-CM | POA: Diagnosis present

## 2020-10-17 DIAGNOSIS — Z681 Body mass index (BMI) 19 or less, adult: Secondary | ICD-10-CM

## 2020-10-17 DIAGNOSIS — Z811 Family history of alcohol abuse and dependence: Secondary | ICD-10-CM

## 2020-10-17 DIAGNOSIS — Z8673 Personal history of transient ischemic attack (TIA), and cerebral infarction without residual deficits: Secondary | ICD-10-CM | POA: Diagnosis not present

## 2020-10-17 DIAGNOSIS — R627 Adult failure to thrive: Secondary | ICD-10-CM | POA: Diagnosis present

## 2020-10-17 DIAGNOSIS — R1319 Other dysphagia: Secondary | ICD-10-CM

## 2020-10-17 DIAGNOSIS — K219 Gastro-esophageal reflux disease without esophagitis: Secondary | ICD-10-CM | POA: Diagnosis present

## 2020-10-17 DIAGNOSIS — F17210 Nicotine dependence, cigarettes, uncomplicated: Secondary | ICD-10-CM | POA: Diagnosis present

## 2020-10-17 DIAGNOSIS — Z720 Tobacco use: Secondary | ICD-10-CM | POA: Diagnosis not present

## 2020-10-17 DIAGNOSIS — I10 Essential (primary) hypertension: Secondary | ICD-10-CM | POA: Diagnosis present

## 2020-10-17 DIAGNOSIS — E222 Syndrome of inappropriate secretion of antidiuretic hormone: Principal | ICD-10-CM | POA: Diagnosis present

## 2020-10-17 DIAGNOSIS — F1010 Alcohol abuse, uncomplicated: Secondary | ICD-10-CM | POA: Diagnosis present

## 2020-10-17 DIAGNOSIS — G40909 Epilepsy, unspecified, not intractable, without status epilepticus: Secondary | ICD-10-CM

## 2020-10-17 DIAGNOSIS — J449 Chronic obstructive pulmonary disease, unspecified: Secondary | ICD-10-CM | POA: Diagnosis present

## 2020-10-17 DIAGNOSIS — Z8249 Family history of ischemic heart disease and other diseases of the circulatory system: Secondary | ICD-10-CM | POA: Diagnosis not present

## 2020-10-17 LAB — TSH: TSH: 0.296 u[IU]/mL — ABNORMAL LOW (ref 0.350–4.500)

## 2020-10-17 LAB — CBC WITH DIFFERENTIAL/PLATELET
Abs Immature Granulocytes: 0.02 10*3/uL (ref 0.00–0.07)
Basophils Absolute: 0 10*3/uL (ref 0.0–0.1)
Basophils Relative: 0 %
Eosinophils Absolute: 0.1 10*3/uL (ref 0.0–0.5)
Eosinophils Relative: 1 %
HCT: 28.5 % — ABNORMAL LOW (ref 39.0–52.0)
Hemoglobin: 10.2 g/dL — ABNORMAL LOW (ref 13.0–17.0)
Immature Granulocytes: 0 %
Lymphocytes Relative: 12 %
Lymphs Abs: 0.6 10*3/uL — ABNORMAL LOW (ref 0.7–4.0)
MCH: 35.7 pg — ABNORMAL HIGH (ref 26.0–34.0)
MCHC: 35.8 g/dL (ref 30.0–36.0)
MCV: 99.7 fL (ref 80.0–100.0)
Monocytes Absolute: 0.9 10*3/uL (ref 0.1–1.0)
Monocytes Relative: 17 %
Neutro Abs: 3.8 10*3/uL (ref 1.7–7.7)
Neutrophils Relative %: 70 %
Platelets: 132 10*3/uL — ABNORMAL LOW (ref 150–400)
RBC: 2.86 MIL/uL — ABNORMAL LOW (ref 4.22–5.81)
RDW: 14.7 % (ref 11.5–15.5)
WBC: 5.5 10*3/uL (ref 4.0–10.5)
nRBC: 0 % (ref 0.0–0.2)

## 2020-10-17 LAB — COMPREHENSIVE METABOLIC PANEL
ALT: 15 U/L (ref 0–44)
AST: 19 U/L (ref 15–41)
Albumin: 3.8 g/dL (ref 3.5–5.0)
Alkaline Phosphatase: 92 U/L (ref 38–126)
Anion gap: 9 (ref 5–15)
BUN: 5 mg/dL — ABNORMAL LOW (ref 6–20)
CO2: 26 mmol/L (ref 22–32)
Calcium: 8.7 mg/dL — ABNORMAL LOW (ref 8.9–10.3)
Chloride: 83 mmol/L — ABNORMAL LOW (ref 98–111)
Creatinine, Ser: <0.3 mg/dL — ABNORMAL LOW (ref 0.61–1.24)
Glucose, Bld: 97 mg/dL (ref 70–99)
Potassium: 3.8 mmol/L (ref 3.5–5.1)
Sodium: 118 mmol/L — CL (ref 135–145)
Total Bilirubin: 0.3 mg/dL (ref 0.3–1.2)
Total Protein: 7.4 g/dL (ref 6.5–8.1)

## 2020-10-17 LAB — SODIUM, URINE, RANDOM: Sodium, Ur: 143 mmol/L

## 2020-10-17 LAB — URINALYSIS, COMPLETE (UACMP) WITH MICROSCOPIC
Bacteria, UA: NONE SEEN
Bilirubin Urine: NEGATIVE
Glucose, UA: >=500 mg/dL — AB
Hgb urine dipstick: NEGATIVE
Ketones, ur: NEGATIVE mg/dL
Leukocytes,Ua: NEGATIVE
Nitrite: NEGATIVE
Protein, ur: NEGATIVE mg/dL
Specific Gravity, Urine: 1.008 (ref 1.005–1.030)
pH: 8 (ref 5.0–8.0)

## 2020-10-17 LAB — T4, FREE: Free T4: 1.05 ng/dL (ref 0.61–1.12)

## 2020-10-17 LAB — FOLATE: Folate: 23.5 ng/mL (ref >5.9)

## 2020-10-17 LAB — MAGNESIUM: Magnesium: 1.7 mg/dL (ref 1.7–2.4)

## 2020-10-17 LAB — HIV ANTIBODY (ROUTINE TESTING W REFLEX): HIV Screen 4th Generation wRfx: NONREACTIVE

## 2020-10-17 LAB — VITAMIN B12: Vitamin B-12: 698 pg/mL (ref 180–914)

## 2020-10-17 LAB — CREATININE, URINE, RANDOM: Creatinine, Urine: 28.13 mg/dL

## 2020-10-17 LAB — PHOSPHORUS: Phosphorus: 3.2 mg/dL (ref 2.5–4.6)

## 2020-10-17 LAB — OSMOLALITY: Osmolality: 279 mOsm/kg (ref 275–295)

## 2020-10-17 MED ORDER — CARBAMAZEPINE 200 MG PO TABS
400.0000 mg | ORAL_TABLET | Freq: Two times a day (BID) | ORAL | Status: DC
  Administered 2020-10-17 – 2020-10-19 (×4): 400 mg via ORAL
  Filled 2020-10-17 (×4): qty 2

## 2020-10-17 MED ORDER — DIPHENHYDRAMINE HCL 12.5 MG/5ML PO ELIX: 25.0000 mg | ORAL_SOLUTION | Freq: Four times a day (QID) | ORAL | Status: DC | PRN

## 2020-10-17 MED ORDER — LORAZEPAM 2 MG/ML IJ SOLN: 1.0000 mg | INTRAMUSCULAR | Status: DC | PRN

## 2020-10-17 MED ORDER — SODIUM CHLORIDE 0.9 % IV SOLN: INTRAVENOUS | Status: DC

## 2020-10-17 MED ORDER — GABAPENTIN 300 MG PO CAPS
300.0000 mg | ORAL_CAPSULE | Freq: Three times a day (TID) | ORAL | Status: DC
  Administered 2020-10-17 – 2020-10-19 (×5): 300 mg via ORAL
  Filled 2020-10-17 (×5): qty 1

## 2020-10-17 MED ORDER — HEPARIN SOD (PORK) LOCK FLUSH 100 UNIT/ML IV SOLN
500.0000 [IU] | Freq: Once | INTRAVENOUS | Status: AC | PRN
  Administered 2020-10-17: 15:00:00 500 [IU]

## 2020-10-17 MED ORDER — SODIUM CHLORIDE 0.9 % IV SOLN
Freq: Once | INTRAVENOUS | Status: AC
  Filled 2020-10-17: qty 10

## 2020-10-17 MED ORDER — ADULT MULTIVITAMIN W/MINERALS CH
1.0000 | ORAL_TABLET | Freq: Every day | ORAL | Status: DC
  Administered 2020-10-17 – 2020-10-19 (×3): 1 via ORAL
  Filled 2020-10-17 (×3): qty 1

## 2020-10-17 MED ORDER — OXYCODONE HCL 5 MG PO TABS
10.0000 mg | ORAL_TABLET | ORAL | Status: DC | PRN
  Administered 2020-10-17 – 2020-10-19 (×11): 10 mg via ORAL
  Filled 2020-10-17 (×11): qty 2

## 2020-10-17 MED ORDER — ACETAMINOPHEN 650 MG RE SUPP: 650.0000 mg | Freq: Four times a day (QID) | RECTAL | Status: DC | PRN

## 2020-10-17 MED ORDER — POTASSIUM CHLORIDE IN NACL 20-0.9 MEQ/L-% IV SOLN: INTRAVENOUS | Status: DC

## 2020-10-17 MED ORDER — ALUM & MAG HYDROXIDE-SIMETH 200-200-20 MG/5ML PO SUSP: 30.0000 mL | Freq: Once | ORAL | Status: DC

## 2020-10-17 MED ORDER — SODIUM CHLORIDE 0.9% FLUSH: 10.0000 mL | INTRAVENOUS | Status: DC | PRN

## 2020-10-17 MED ORDER — LIDOCAINE VISCOUS HCL 2 % MT SOLN: 15.0000 mL | Freq: Once | OROMUCOSAL | Status: DC

## 2020-10-17 MED ORDER — OXYCODONE HCL 5 MG PO TABS
10.0000 mg | ORAL_TABLET | Freq: Once | ORAL | Status: AC
  Administered 2020-10-17: 14:00:00 10 mg via ORAL
  Filled 2020-10-17: qty 2

## 2020-10-17 MED ORDER — LORAZEPAM 1 MG PO TABS: 1.0000 mg | ORAL_TABLET | ORAL | Status: DC | PRN

## 2020-10-17 MED ORDER — ALUM & MAG HYDROXIDE-SIMETH 200-200-20 MG/5ML PO SUSP
30.0000 mL | Freq: Once | ORAL | Status: AC
  Administered 2020-10-17: 15:00:00 30 mL via ORAL
  Filled 2020-10-17: qty 30

## 2020-10-17 MED ORDER — ACETAMINOPHEN 325 MG PO TABS: 650.0000 mg | ORAL_TABLET | Freq: Four times a day (QID) | ORAL | Status: DC | PRN

## 2020-10-17 MED ORDER — ENOXAPARIN SODIUM 40 MG/0.4ML ~~LOC~~ SOLN
40.0000 mg | SUBCUTANEOUS | Status: DC
  Administered 2020-10-17 – 2020-10-18 (×2): 40 mg via SUBCUTANEOUS
  Filled 2020-10-17 (×2): qty 0.4

## 2020-10-17 MED ORDER — THIAMINE HCL 100 MG PO TABS
100.0000 mg | ORAL_TABLET | Freq: Every day | ORAL | Status: DC
  Administered 2020-10-17 – 2020-10-19 (×3): 100 mg via ORAL
  Filled 2020-10-17 (×3): qty 1

## 2020-10-17 MED ORDER — ONDANSETRON HCL 4 MG/2ML IJ SOLN
4.0000 mg | Freq: Four times a day (QID) | INTRAMUSCULAR | Status: DC | PRN
  Administered 2020-10-19: 01:00:00 4 mg via INTRAVENOUS
  Filled 2020-10-17: qty 2

## 2020-10-17 MED ORDER — SODIUM CHLORIDE 0.9 % IV SOLN
150.0000 mg | Freq: Once | INTRAVENOUS | Status: AC
  Administered 2020-10-17: 11:00:00 150 mg via INTRAVENOUS
  Filled 2020-10-17: qty 125

## 2020-10-17 MED ORDER — THIAMINE HCL 100 MG/ML IJ SOLN: 100.0000 mg | Freq: Every day | INTRAMUSCULAR | Status: DC

## 2020-10-17 MED ORDER — ONDANSETRON HCL 4 MG PO TABS: 4.0000 mg | ORAL_TABLET | Freq: Four times a day (QID) | ORAL | Status: DC | PRN

## 2020-10-17 MED ORDER — OXYCODONE HCL 5 MG PO TABS
10.0000 mg | ORAL_TABLET | Freq: Once | ORAL | Status: AC
  Administered 2020-10-17: 10:00:00 10 mg via ORAL
  Filled 2020-10-17: qty 2

## 2020-10-17 MED ORDER — PALONOSETRON HCL INJECTION 0.25 MG/5ML
0.2500 mg | Freq: Once | INTRAVENOUS | Status: AC
  Administered 2020-10-17: 11:00:00 0.25 mg via INTRAVENOUS
  Filled 2020-10-17: qty 5

## 2020-10-17 MED ORDER — OXYCODONE HCL 5 MG PO TABS: 5.0000 mg | ORAL_TABLET | Freq: Once | ORAL | Status: DC

## 2020-10-17 MED ORDER — PANTOPRAZOLE SODIUM 40 MG PO TBEC
40.0000 mg | DELAYED_RELEASE_TABLET | Freq: Two times a day (BID) | ORAL | Status: DC
  Administered 2020-10-17 – 2020-10-19 (×4): 40 mg via ORAL
  Filled 2020-10-17 (×4): qty 1

## 2020-10-17 MED ORDER — LEVETIRACETAM 500 MG PO TABS
1000.0000 mg | ORAL_TABLET | Freq: Two times a day (BID) | ORAL | Status: DC
  Administered 2020-10-17 – 2020-10-19 (×5): 1000 mg via ORAL
  Filled 2020-10-17 (×5): qty 2

## 2020-10-17 MED ORDER — SODIUM CHLORIDE 0.9 % IV SOLN
10.0000 mg | Freq: Once | INTRAVENOUS | Status: AC
  Administered 2020-10-17: 11:00:00 10 mg via INTRAVENOUS
  Filled 2020-10-17: qty 10

## 2020-10-17 MED ORDER — LIDOCAINE VISCOUS HCL 2 % MT SOLN
15.0000 mL | Freq: Once | OROMUCOSAL | Status: AC
  Administered 2020-10-17: 15:00:00 15 mL via ORAL
  Filled 2020-10-17: qty 15

## 2020-10-17 MED ORDER — FOLIC ACID 1 MG PO TABS
1.0000 mg | ORAL_TABLET | Freq: Every day | ORAL | Status: DC
  Administered 2020-10-17 – 2020-10-19 (×3): 1 mg via ORAL
  Filled 2020-10-17 (×3): qty 1

## 2020-10-17 MED ORDER — OSMOLITE 1.5 CAL PO LIQD
237.0000 mL | Freq: Four times a day (QID) | ORAL | Status: DC
  Administered 2020-10-17 – 2020-10-18 (×3): 237 mL

## 2020-10-17 MED ORDER — SODIUM CHLORIDE 0.9 % IV SOLN
40.0000 mg/m2 | Freq: Once | INTRAVENOUS | Status: AC
  Administered 2020-10-17: 12:00:00 60 mg via INTRAVENOUS
  Filled 2020-10-17: qty 60

## 2020-10-17 NOTE — Progress Notes (Signed)
NA 118 today for treatment.  Ok to treat today verbal order Sandi Mealy, PA.  Patient to receive pain medications and admission after chemotherapy for low sodium levels.    Report called to Wilford Grist, RN on third floor.    Patient tolerated chemotherapy with no complaints voiced.  Side effects with management reviewed with understanding verbalized.  Port site clean and dry with no bruising or swelling noted at site.  Good blood return noted before and after administration of chemotherapy.  Band aid applied.  Patient left in satisfactory condition with VSS and no s/s of distress noted.  Discharged to the third floor.

## 2020-10-17 NOTE — H&P (Signed)
History and Physical  [name] DOB: [birth year] DOA: (Not on file)   PCP: Jani Gravel, MD   Patient coming from: Home  Chief Complaint: Generalized weakness  HPI:  [name] is a 44 y.o. male with medical history of COPD, tobacco abuse, alcohol dependence, seizure disorder squamous cell carcinoma of the epiglottis, presented to his usual appointment to the [facility] on the morning of 10/17/2020 for his seventh cycle of cisplatin.  Routine blood work showed that he had a sodium of 118. The patient himself denies any f/c, cp, sob, diarrhea, abd pain, dysuria, hematuria.  He states that he normally gives himself 3-4 cans of Osmolite through his PEG tube daily.  He states he does take po but that the amount he has taken in has decreased in the past week, partly due to pain in his throat.  He denies any new meds.  He states that on the evening of 12/19, he had an episode of post-tussive emesis.  He has not had any emesis since then.  He denies any hemoptysis He continues to smoke about 1ppd and drink about 20 ounces of beer daily.  He denies any ilicit drug use.  He states his last XRT is due on 10/26/20 in [place].  He feels that he has had more generalized weakness in the past week than usual.  He denies any falls or syncope. At the [facility], he was afebrile and hemodynamically stable.  Direct admission was requested due to his weakness and low Na of 118.  Assessment/Plan: Hyponatremia -due to poor solute intake, hypovolemia and possible potomania and SIADH -his Tegretol may also be contributing to this -start isotonic saline and monitor serial Na -Urine Na -urine creatinine -urine osm -serum osm -TSH -Free T4 -am cortisol  Failure to Thrive -B12 -folate -TSH -UA -PT eval -continue Osmolite 1.5--4 cans daily -ask for nutrition to re-eval enteral feeding  Squamous cell carcinoma of the Epiglottis -initially diagnosed  08/22/20 -follows Dr. Delton Coombes -received cycle 7 cisplatin 12/20 -due to finish XRT 12/29  Alcohol abuse -CIWA protocol  Right neck pain -continue home dose oxycodone  Tobacco Abuse -continues to smoke up to 1ppd -has about 30 pack years -cessation discussed  Seizure disorder -continue home meds for now, but may need to be switched off of his carbamazepine -continue keppra        Past Medical History:  Diagnosis Date  . Back pain   . Bradycardia 08/17/2011  . Cancer ([place])   . COPD (chronic obstructive pulmonary disease) ([place])   . ETOH abuse   . GERD (gastroesophageal reflux disease)   . Macrocytosis 08/17/2011  . Pancreatitis   . Pneumonia   . Port-A-Cath in place 09/04/2020  . SAH (subarachnoid hemorrhage) ([place])   . Seizures ([place])    last one 6 mos ago   Past Surgical History:  Procedure Laterality Date  . BACK SURGERY    . ESOPHAGOGASTRODUODENOSCOPY (EGD) WITH PROPOFOL N/A 08/14/2017   Procedure: ESOPHAGOGASTRODUODENOSCOPY (EGD) WITH PROPOFOL;  Surgeon: Daneil Dolin, MD;  Location[facility];  Service: Endoscopy;  Laterality: N/A;  1:30pm  . ESOPHAGOGASTRODUODENOSCOPY (EGD) WITH PROPOFOL N/A 03/13/2018   Procedure: ESOPHAGOGASTRODUODENOSCOPY (EGD) WITH PROPOFOL;  Surgeon: Daneil Dolin, MD;  Location: [facility];  Service: Endoscopy;  Laterality: N/A;  2:30pm  . ESOPHAGOGASTRODUODENOSCOPY (EGD) WITH PROPOFOL N/A 08/22/2020   Procedure: ESOPHAGOGASTRODUODENOSCOPY (EGD) WITH PROPOFOL;  Surgeon: Aviva Signs, MD;  Location:  [facility] ORS;  Service: General;  Laterality: N/A;  . PEG PLACEMENT N/A 08/22/2020   Procedure: PERCUTANEOUS ENDOSCOPIC GASTROSTOMY (PEG) PLACEMENT;  Surgeon: Aviva Signs, MD;  Location: [facility] ORS;  Service: General;  Laterality: N/A;  . PORTACATH PLACEMENT Left 08/22/2020   Procedure: INSERTION PORT-A-CATH;  Surgeon: Aviva Signs, MD;  Location: [facility] ORS;  Service: General;  Laterality: Left;  . SEPTOPLASTY     Social History:  reports  that he has been smoking cigarettes. He has a 20.00 pack-year smoking history. He has never used smokeless tobacco. He reports current alcohol use. He reports previous drug use. Drug: Marijuana.   Family History  Problem Relation Age of Onset  . Coronary artery disease Mother        deceased age 48  . Seizures Father   . Alcohol abuse Father   . Colon cancer Neg Hx      No Known Allergies   Prior to Admission medications   Medication Sig Start Date End Date Taking? Authorizing Provider  albuterol (PROVENTIL HFA;VENTOLIN HFA) 108 (90 BASE) MCG/ACT inhaler Inhale 2 puffs into the lungs every 6 (six) hours as needed for wheezing or shortness of breath.    [provider]  albuterol (PROVENTIL) (2.5 MG/3ML) 0.083% nebulizer solution Take 2.5 mg by nebulization every 6 (six) hours as needed for wheezing or shortness of breath.    [provider]  amLODipine (NORVASC) 10 MG tablet Take 10 mg by mouth daily.  06/22/20   [provider]  carbamazepine (TEGRETOL) 200 MG tablet Take 400 mg by mouth 2 (two) times daily.    [provider]  chlordiazePOXIDE (LIBRIUM) 25 MG capsule Take 1 capsule (25 mg total) by mouth every 6 (six) hours. 09/05/20   Derek Jack, MD  CISPLATIN IV Inject into the vein once a week. 09/05/20   [provider]  dexamethasone 0.5 MG/5ML elixir Take by mouth. 09/15/20   [provider]  diclofenac Sodium (VOLTAREN) 1 % GEL Apply 2 g topically daily as needed (pain).  07/28/20   [provider]  diphenhydrAMINE (BENADRYL) 12.5 MG/5ML elixir Take 10 mLs by mouth in the morning, at noon, in the evening, and at bedtime. 09/15/20 11/15/20  [provider]  gabapentin (NEURONTIN) 300 MG capsule Take 300 mg by mouth 3 (three) times daily. 07/21/20   [provider]  levETIRAcetam (KEPPRA) 500 MG tablet Take 1,000 mg by mouth 2 (two) times daily.    [provider]  lidocaine-prilocaine  (EMLA) cream Apply small amount to port a cath site and cover with plastic wrap 1 hour prior to chemotherapy appointments Patient not taking: Reported on 10/17/2020 09/04/20   Derek Jack, MD  Oxycodone HCl 10 MG TABS Take 1 tablet (10 mg total) by mouth every 4 (four) hours as needed. 10/10/20   Benay Pike, MD  pantoprazole (PROTONIX) 40 MG tablet Take 1 tablet (40 mg total) by mouth 2 (two) times daily before a meal. 08/12/17   Mahala Menghini, PA-C  prochlorperazine (COMPAZINE) 10 MG tablet Take 1 tablet (10 mg total) by mouth every 6 (six) hours as needed for nausea or vomiting. 08/25/20   Derek Jack, MD  SSD 1 % cream Apply topically in the morning and at bedtime. 10/04/20   [provider]    Review of Systems:  Constitutional:  No weight loss, night sweats, Fevers, chills, fatigue.  Head&Eyes: No headache.  No vision loss.  No eye pain or scotoma ENT:  No Difficulty swallowing,Tooth/dental  problems,Sore throat,  No ear ache, post nasal drip,  Cardio-vascular:  No chest pain, Orthopnea, PND, swelling in lower extremities,  dizziness, palpitations  GI:  No  abdominal pain, nausea, vomiting, diarrhea, loss of appetite, hematochezia, melena, heartburn, indigestion, Resp:  No shortness of breath with exertion or at rest. No cough. No coughing up of blood .No wheezing.No chest wall deformity  Skin:  no rash or lesions.  GU:  no dysuria, change in color of urine, no urgency or frequency. No flank pain.  Musculoskeletal:  No joint pain or swelling. No decreased range of motion. No back pain.  Psych:  No change in mood or affect. No depression or anxiety. Neurologic: No headache, no dysesthesia, no focal weakness, no vision loss. No syncope  Physical Exam: There were no vitals filed for this visit. General:  A&O x 3, NAD, nontoxic, pleasant/cooperative Head/Eye: No conjunctival hemorrhage, no icterus, Parc/AT, No nystagmus ENT:  No icterus,  No thrush,  good dentition, no pharyngeal exudate Neck:  No masses, no lymphadenpathy, no bruits CV:  RRR, no rub, no gallop, no S3 Lung:  CTAB, good air movement, no wheeze, no rhonchi Abdomen: soft/NT, +BS, nondistended, no peritoneal signs Ext: No cyanosis, No rashes, No petechiae, No lymphangitis, No edema Neuro: CNII-XII intact, strength 4/5 in bilateral upper and lower extremities, no dysmetria  Labs on Admission:  Basic Metabolic Panel: Recent Labs  Lab 10/17/20 0923  NA 118*  K 3.8  CL 83*  CO2 26  GLUCOSE 97  BUN 5*  CREATININE <0.30*  CALCIUM 8.7*  MG 1.7   Liver Function Tests: Recent Labs  Lab 10/17/20 0923  AST 19  ALT 15  ALKPHOS 92  BILITOT 0.3  PROT 7.4  ALBUMIN 3.8   No results for input(s): LIPASE, AMYLASE in the last 168 hours. No results for input(s): AMMONIA in the last 168 hours. CBC: Recent Labs  Lab 10/17/20 0923  WBC 5.5  NEUTROABS 3.8  HGB 10.2*  HCT 28.5*  MCV 99.7  PLT 132*   Coagulation Profile: No results for input(s): INR, PROTIME in the last 168 hours. Cardiac Enzymes: No results for input(s): CKTOTAL, CKMB, CKMBINDEX, TROPONINI in the last 168 hours. BNP: Invalid input(s): POCBNP CBG: No results for input(s): GLUCAP in the last 168 hours. Urine analysis:    Component Value Date/Time   COLORURINE YELLOW 08/12/2017 0325   APPEARANCEUR CLEAR 08/12/2017 0325   LABSPEC 1.025 08/12/2017 0325   PHURINE 5.0 08/12/2017 0325   GLUCOSEU NEGATIVE 08/12/2017 0325   HGBUR NEGATIVE 08/12/2017 0325   BILIRUBINUR NEGATIVE 08/12/2017 0325   KETONESUR 5 (A) 08/12/2017 0325   PROTEINUR 100 (A) 08/12/2017 0325   UROBILINOGEN 0.2 07/18/2015 0549   NITRITE NEGATIVE 08/12/2017 0325   LEUKOCYTESUR NEGATIVE 08/12/2017 0325   Sepsis Labs: @LABRCNTIP (procalcitonin:4,lacticidven:4) )No results found for this or any previous visit (from the past 240 hour(s)).   Radiological Exams on Admission: No results found.      Time spent:60 minutes Code  Status:   FULL Family Communication:  No Family at bedside Disposition Plan: expect 2 day hospitalization Consults called:none DVT Prophylaxis: [place] Lovenox  Orson Eva, DO  [facility] Pager [phone]  If 7PM-7AM, please contact night-coverage [email or web address] Password [facility] 10/17/2020, 11:38 AM

## 2020-10-17 NOTE — Progress Notes (Signed)
OK to treat.  Moss Berry, MHS, PA-C Physician Assistant 

## 2020-10-18 ENCOUNTER — Encounter (HOSPITAL_COMMUNITY): Payer: Self-pay | Admitting: Internal Medicine

## 2020-10-18 ENCOUNTER — Ambulatory Visit (HOSPITAL_COMMUNITY): Payer: Medicaid Other

## 2020-10-18 LAB — GLUCOSE, CAPILLARY
Glucose-Capillary: 109 mg/dL — ABNORMAL HIGH (ref 70–99)
Glucose-Capillary: 145 mg/dL — ABNORMAL HIGH (ref 70–99)
Glucose-Capillary: 152 mg/dL — ABNORMAL HIGH (ref 70–99)
Glucose-Capillary: 177 mg/dL — ABNORMAL HIGH (ref 70–99)
Glucose-Capillary: 62 mg/dL — ABNORMAL LOW (ref 70–99)
Glucose-Capillary: 69 mg/dL — ABNORMAL LOW (ref 70–99)

## 2020-10-18 LAB — BASIC METABOLIC PANEL
Anion gap: 7 (ref 5–15)
Anion gap: 6 (ref 5–15)
BUN: <5 mg/dL — ABNORMAL LOW (ref 6–20)
BUN: 5 mg/dL — ABNORMAL LOW (ref 6–20)
CO2: 27 mmol/L (ref 22–32)
CO2: 26 mmol/L (ref 22–32)
Calcium: 8.9 mg/dL (ref 8.9–10.3)
Calcium: 8.2 mg/dL — ABNORMAL LOW (ref 8.9–10.3)
Chloride: 93 mmol/L — ABNORMAL LOW (ref 98–111)
Chloride: 94 mmol/L — ABNORMAL LOW (ref 98–111)
Creatinine, Ser: 0.33 mg/dL — ABNORMAL LOW (ref 0.61–1.24)
Creatinine, Ser: <0.3 mg/dL — ABNORMAL LOW (ref 0.61–1.24)
GFR, Estimated: >60 mL/min (ref >60)
Glucose, Bld: 129 mg/dL — ABNORMAL HIGH (ref 70–99)
Glucose, Bld: 72 mg/dL (ref 70–99)
Potassium: 4.4 mmol/L (ref 3.5–5.1)
Potassium: 4 mmol/L (ref 3.5–5.1)
Sodium: 127 mmol/L — ABNORMAL LOW (ref 135–145)
Sodium: 126 mmol/L — ABNORMAL LOW (ref 135–145)

## 2020-10-18 LAB — OSMOLALITY, URINE: Osmolality, Ur: 445 mOsm/kg (ref 300–900)

## 2020-10-18 LAB — CBC
HCT: 28.9 % — ABNORMAL LOW (ref 39.0–52.0)
Hemoglobin: 9.9 g/dL — ABNORMAL LOW (ref 13.0–17.0)
MCH: 36 pg — ABNORMAL HIGH (ref 26.0–34.0)
MCHC: 34.3 g/dL (ref 30.0–36.0)
MCV: 105.1 fL — ABNORMAL HIGH (ref 80.0–100.0)
Platelets: 151 10*3/uL (ref 150–400)
RBC: 2.75 MIL/uL — ABNORMAL LOW (ref 4.22–5.81)
RDW: 15.5 % (ref 11.5–15.5)
WBC: 3.4 10*3/uL — ABNORMAL LOW (ref 4.0–10.5)
nRBC: 0 % (ref 0.0–0.2)

## 2020-10-18 LAB — MAGNESIUM: Magnesium: 2 mg/dL (ref 1.7–2.4)

## 2020-10-18 LAB — CORTISOL-AM, BLOOD: Cortisol - AM: 2.8 ug/dL — ABNORMAL LOW (ref 6.7–22.6)

## 2020-10-18 MED ORDER — OSMOLITE 1.5 CAL PO LIQD
237.0000 mL | Freq: Every day | ORAL | Status: DC
  Administered 2020-10-18 – 2020-10-19 (×4): 237 mL

## 2020-10-18 MED ORDER — PHENOL 1.4 % MT LIQD
1.0000 | OROMUCOSAL | Status: DC | PRN
  Administered 2020-10-18: 22:00:00 1 via OROMUCOSAL
  Filled 2020-10-18: qty 177

## 2020-10-18 MED ORDER — OSMOLITE 1.2 CAL PO LIQD: 1000.0000 mL | ORAL | Status: DC

## 2020-10-18 MED ORDER — DEXTROSE 50 % IV SOLN
1.0000 | Freq: Once | INTRAVENOUS | Status: AC
  Administered 2020-10-18: 17:00:00 50 mL via INTRAVENOUS
  Filled 2020-10-18: qty 50

## 2020-10-18 MED ORDER — CHLORHEXIDINE GLUCONATE CLOTH 2 % EX PADS: 6.0000 | MEDICATED_PAD | Freq: Every day | CUTANEOUS | Status: DC

## 2020-10-18 NOTE — Evaluation (Signed)
Physical Therapy Evaluation Patient Details Name: [name] MRN: [id number] DOB: 1976/09/21 Today's Date: 10/18/2020   History of Present Illness  Squamous cell carcinoma of the epiglottis: [name] presents to the clinic today for consideration of cycle 7 of cisplatin which is given with concurrent chemoradiation.  He is scheduled to return on 10/19/2020 for IV fluids.  He will be seen next in for cycle 8 of chemotherapy.  He is scheduled to complete radiation next week.     Hyponatremia: The patient's labs returned today with a sodium of 118.  Based on this the patient was directly admitted to [facility] under the care of Dr. Shanon Brow Tat.    Clinical Impression  Patient functioning at baseline for functional mobility and gait.  Plan:  Patient discharged from physical therapy to care of nursing for ambulation daily as tolerated for length of stay.     Follow Up Recommendations No PT follow up    Equipment Recommendations  None recommended by PT    Recommendations for Other Services       Precautions / Restrictions Precautions Precautions: None Restrictions Weight Bearing Restrictions: No      Mobility  Bed Mobility Overal bed mobility: Independent                  Transfers Overall transfer level: Modified independent                  Ambulation/Gait Ambulation/Gait assistance: Modified independent (Device/Increase time) Gait Distance (Feet): 100 Feet Assistive device: None Gait Pattern/deviations: WFL(Within Functional Limits) Gait velocity: slightly decreased   General Gait Details: demonstrates good return for ambulation in room and hallway without loss of balance  Stairs            Wheelchair Mobility    Modified Rankin (Stroke Patients Only)       Balance Overall balance assessment: No apparent balance deficits (not formally assessed)                                           Pertinent  Vitals/Pain Pain Assessment: No/denies pain    Home Living Family/patient expects to be discharged to:: Private residence Living Arrangements: Spouse/significant other Available Help at Discharge: Family Type of Home: House Home Access: Stairs to enter Entrance Stairs-Rails: Right;Left;Can reach both Technical brewer of Steps: 3-4 Home Layout: One level Home Equipment: Wheelchair - [employer] - 2 wheels;Bedside commode Additional Comments: Patient states he takes sponge baths and uses his BSC    Prior Function Level of Independence: Independent         Comments: household ambulator without AD     Hand Dominance        Extremity/Trunk Assessment   Upper Extremity Assessment Upper Extremity Assessment: Overall WFL for tasks assessed    Lower Extremity Assessment Lower Extremity Assessment: Overall WFL for tasks assessed    Cervical / Trunk Assessment Cervical / Trunk Assessment: Normal  Communication   Communication: No difficulties  Cognition Arousal/Alertness: Awake/alert Behavior During Therapy: WFL for tasks assessed/performed Overall Cognitive Status: Within Functional Limits for tasks assessed                                        General Comments      Exercises  Assessment/Plan    PT Assessment Patent does not need any further PT services  PT Problem List         PT Treatment Interventions      PT Goals (Current goals can be found in the Care Plan section)  Acute Rehab PT Goals Patient Stated Goal: return home with family to assist PT Goal Formulation: With patient Time For Goal Achievement: 10/18/20 Potential to Achieve Goals: Good    Frequency     Barriers to discharge        Co-evaluation               AM-PAC PT "6 Clicks" Mobility  Outcome Measure Help needed turning from your back to your side while in a flat bed without using bedrails?: None Help needed moving from lying on your back to  sitting on the side of a flat bed without using bedrails?: None Help needed moving to and from a bed to a chair (including a wheelchair)?: None Help needed standing up from a chair using your arms (e.g., wheelchair or bedside chair)?: None Help needed to walk in hospital room?: None Help needed climbing 3-5 steps with a railing? : None 6 Click Score: 24    End of Session   Activity Tolerance: Patient tolerated treatment well Patient left: in bed;with call bell/phone within reach Nurse Communication: Mobility status PT Visit Diagnosis: Unsteadiness on feet (R26.81);Other abnormalities of gait and mobility (R26.89);Muscle weakness (generalized) (M62.81)    Time: 9629-5284 PT Time Calculation (min) (ACUTE ONLY): 15 min   Charges:   PT Evaluation $PT Eval Low Complexity: 1 Low PT Treatments $Therapeutic Activity: 8-22 mins        10:02 AM, 10/18/20 Lonell Grandchild, MPT Physical Therapist with [facility] 336 [phone] office [phone] mobile phone

## 2020-10-18 NOTE — TOC Initial Note (Signed)
Transition of Care (TOC) - Initial/Assessment Note    Patient Details  Name: [name] MRN: [id number] Date of Birth: 07/08/1976  Transition of Care (TOC) CM/SW Contact:     , LCSW Phone Number: 10/18/2020, 1:18 PM  Clinical Narrative:                  Pt admitted from the cancer center due to low sodium. Met with pt today to assess. Per pt, he resides with his long time significant other who does assist in his care needs including transporting to appointments and helping him with is tube feedings. Pt reports that he has been getting radiation and chemotherapy and is nearing the end of his treatment. Pt states that he has been getting 3 or 4 cans of his tube feed daily. He states that the MD wants him to do 5 feedings at home. Pt states that he will ask his Sig Other if she can help him keep to a schedule and make sure he gets them all in. Encouragement and emotional support offered to pt.  Pt does not feel he will have any need for outpatient referrals or resources at dc. TOC will follow and assist if needed.  Expected Discharge Plan: Home/Self Care Barriers to Discharge: Continued Medical Work up   Patient Goals and CMS Choice Patient states their goals for this hospitalization and ongoing recovery are:: go home      Expected Discharge Plan and Services Expected Discharge Plan: Home/Self Care In-house Referral: Clinical Social Work     Living arrangements for the past 2 months: Single Family Home                                      Prior Living Arrangements/Services Living arrangements for the past 2 months: Single Family Home Lives with:: Significant Other Patient language and need for interpreter reviewed:: Yes Do you feel safe going back to the place where you live?: Yes      Need for Family Participation in Patient Care: Yes (Comment) Care giver support system in place?: Yes (comment)   Criminal Activity/Legal Involvement Pertinent to  Current Situation/Hospitalization: No - Comment as needed  Activities of Daily Living Home Assistive Devices/Equipment: None ADL Screening (condition at time of admission) Patient's cognitive ability adequate to safely complete daily activities?: Yes Is the patient deaf or have difficulty hearing?: No Does the patient have difficulty seeing, even when wearing glasses/contacts?: No Does the patient have difficulty concentrating, remembering, or making decisions?: No Patient able to express need for assistance with ADLs?: Yes Does the patient have difficulty dressing or bathing?: No Independently performs ADLs?: Yes (appropriate for developmental age) Does the patient have difficulty walking or climbing stairs?: No Weakness of Legs: None Weakness of Arms/Hands: None  Permission Sought/Granted                  Emotional Assessment   Attitude/Demeanor/Rapport: Engaged Affect (typically observed): Pleasant Orientation: : Oriented to Self,Oriented to Place,Oriented to  Time,Oriented to Situation Alcohol / Substance Use: Alcohol Use Psych Involvement: No (comment)  Admission diagnosis:  Hyponatremia [E87.1] Patient Active Problem List   Diagnosis Date Noted  . Port-A-Cath in place 09/04/2020  . 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 Abdominal pain, epigastric 12/31/2017  . Gastric ulcer 08/12/2017  . ETOH abuse   . Atypical chest pain   . Hypercalcemia 10/08/2016  .   Hypokalemia 10/08/2016  . Diarrhea 10/09/2015  . Pancreatitis, alcoholic, acute   . Alcohol-induced chronic pancreatitis (HCC)   . Acute pancreatitis   . Pancreatitis 12/26/2014  . Pancreatic pseudocyst 12/26/2014  . Hyponatremia 12/26/2014  . HTN (hypertension) 07/27/2013  . Abdominal pain 07/11/2012  . Macrocytosis 08/17/2011  . Marijuana abuse 08/17/2011  . Bradycardia 08/17/2011  . Alcohol withdrawal (HCC) 08/17/2011  . Elevated blood pressure 07/08/2011  . Left against medical advice  07/08/2011  . Acute alcoholic pancreatitis 07/07/2011  . Alcohol abuse, continuous 07/07/2011  . Tobacco abuse 07/07/2011  . Seizure disorder (HCC) 07/07/2011  . Adynamic ileus (HCC) 07/07/2011  . Wheezing 07/07/2011  . COPD (chronic obstructive pulmonary disease) (HCC) 07/07/2011   PCP:  Kim, James, MD Pharmacy:   [facility] - [place], [place] - [address] [place]  [address] Phone: [phone] Fax: [phone]     Social Determinants of Health (SDOH) Interventions    Readmission Risk Interventions Readmission Risk Prevention Plan 10/18/2020  Transportation Screening Complete  Home Care Screening Complete  Medication Review (RN CM) Complete  Some recent data might be hidden    

## 2020-10-18 NOTE — Progress Notes (Addendum)
PROGRESS NOTE  [name] YIA:[id number] DOB: 12-29-1975 DOA: 10/17/2020 PCP: Jani Gravel, MD  Brief History:  44 y.o. male with medical history of COPD, tobacco abuse, alcohol dependence, seizure disorder squamous cell carcinoma of the epiglottis, presented to his usual appointment to the [facility] on the morning of 10/17/2020 for his seventh cycle of cisplatin.  Routine blood work showed that he had a sodium of 118. The patient himself denies any f/c, cp, sob, diarrhea, abd pain, dysuria, hematuria.  He states that he normally gives himself 3-4 cans of Osmolite through his PEG tube daily.  He states he does take po but that the amount he has taken in has decreased in the past week, partly due to pain in his throat.  He denies any new meds.  He states that on the evening of 12/19, he had an episode of post-tussive emesis.  He has not had any emesis since then.  He denies any hemoptysis He continues to smoke about 1ppd and drink about 20 ounces of beer daily.  He denies any ilicit drug use.  He states his last XRT is due on 10/26/20 in [place].  He feels that he has had more generalized weakness in the past week than usual.  He denies any falls or syncope. At the [facility], he was afebrile and hemodynamically stable.  Direct admission was requested due to his weakness and low Na of 118.   Assessment/Plan:  Hyponatremia -due to poor solute intake, hypovolemia and a degree SIADH -his Tegretol may also be contributing to this -continue isotonic saline and monitor serial Na -FeNa--1.32% -urine osm-445 -serum osm--279 -TSH--0.296 -Free T4--1.05 -am cortisol--2.9-->cortrosyn stimulation test -switch tegretol to topamax if Na does not continue to improve  Failure to Thrive -B12--698 -folate--23.5 -TSH--0.296 -UA--neg pyuria -PT eval -continue Osmolite 1.5--4 cans daily -ask for nutrition to re-eval enteral feeding-->increase to 5 cans  daily  Squamous cell carcinoma of the Epiglottis -initially diagnosed 08/22/20 -follows Dr. Delton Coombes -received cycle 7 cisplatin 12/20 -due to finish XRT 12/29  Alcohol abuse -CIWA protocol  Right neck pain -continue home dose oxycodone  Tobacco Abuse -continues to smoke up to 1ppd -has about 30 pack years -cessation discussed  Seizure disorder -continue home meds for now, but may need to be switched off of his carbamazepine -continue keppra -plan switch carbamazepine to topamax (25 bid x 1 week, then 50 mg bid) if Na does not continue to Improve -discussed with Epilleptology--Dr. Ellouise Newer      Status is: Inpatient  Remains inpatient appropriate because:IV treatments appropriate due to intensity of illness or inability to take PO   Dispo: The patient is from: Home              Anticipated d/c is to: Home              Anticipated d/c date is: 1 day              Patient currently is not medically stable to d/c.        Family Communication:  no Family at bedside  Consultants:  neurology  Code Status:  FULL   DVT Prophylaxis:  [employer] Lovenox   Procedures: As Listed in Progress Note Above  Antibiotics: None       Subjective:  Patient feels a little better, but remains weak.  Patient denies fevers, chills, headache, chest pain, dyspnea, nausea, vomiting, diarrhea, abdominal pain,  dysuria, hematuria, hematochezia, and melena.  Objective: Vitals:   10/17/20 2048 10/18/20 0430 10/18/20 0742 10/18/20 1451  BP: (!) 129/57 137/64  (!) 123/56  Pulse: 64 60  82  Resp: 18 18  20   Temp: 97.7 F (36.5 C) 98.1 F (36.7 C)  98.1 F (36.7 C)  TempSrc:    Oral  SpO2: 100% 100%  100%  Weight:   44.4 kg   Height:   5\' 9"  (1.753 m)     Intake/Output Summary (Last 24 hours) at 10/18/2020 1611 Last data filed at 10/18/2020 0500 Gross per 24 hour  Intake 971.5 ml  Output 500 ml  Net 471.5 ml   Weight change:  Exam:   General:  Pt is alert,  follows commands appropriately, not in acute distress  HEENT: No icterus, No thrush, No neck mass, [place]/AT  Cardiovascular: RRR, S1/S2, no rubs, no gallops  Respiratory: bibasilar crackles. No wheeze  Abdomen: Soft/+BS, non tender, non distended, no guarding  Extremities: No edema, No lymphangitis, No petechiae, No rashes, no synovitis   Data Reviewed: I have personally reviewed following labs and imaging studies Basic Metabolic Panel: Recent Labs  Lab 10/17/20 0923 10/17/20 1811 10/18/20 0524 10/18/20 1247  NA 118*  --  127* 126*  K 3.8  --  4.4 4.0  CL 83*  --  93* 94*  CO2 26  --  27 26  GLUCOSE 97  --  129* 72  BUN 5*  --  <5* 5*  CREATININE <0.30*  --  0.33* <0.30*  CALCIUM 8.7*  --  8.9 8.2*  MG 1.7  --  2.0  --   PHOS  --  3.2  --   --    Liver Function Tests: Recent Labs  Lab 10/17/20 0923  AST 19  ALT 15  ALKPHOS 92  BILITOT 0.3  PROT 7.4  ALBUMIN 3.8   No results for input(s): LIPASE, AMYLASE in the last 168 hours. No results for input(s): AMMONIA in the last 168 hours. Coagulation Profile: No results for input(s): INR, PROTIME in the last 168 hours. CBC: Recent Labs  Lab 10/17/20 0923 10/18/20 0524  WBC 5.5 3.4*  NEUTROABS 3.8  --   HGB 10.2* 9.9*  HCT 28.5* 28.9*  MCV 99.7 105.1*  PLT 132* 151   Cardiac Enzymes: No results for input(s): CKTOTAL, CKMB, CKMBINDEX, TROPONINI in the last 168 hours. BNP: Invalid input(s): POCBNP CBG: Recent Labs  Lab 10/18/20 1450  GLUCAP 152*   HbA1C: No results for input(s): HGBA1C in the last 72 hours. Urine analysis:    Component Value Date/Time   COLORURINE YELLOW 10/17/2020 2143   APPEARANCEUR HAZY (A) 10/17/2020 2143   LABSPEC 1.008 10/17/2020 2143   PHURINE 8.0 10/17/2020 2143   GLUCOSEU >=500 (A) 10/17/2020 2143   HGBUR NEGATIVE 10/17/2020 2143   BILIRUBINUR NEGATIVE 10/17/2020 2143   KETONESUR NEGATIVE 10/17/2020 2143   PROTEINUR NEGATIVE 10/17/2020 2143   UROBILINOGEN 0.2 07/18/2015  0549   NITRITE NEGATIVE 10/17/2020 2143   LEUKOCYTESUR NEGATIVE 10/17/2020 2143   Sepsis Labs: @LABRCNTIP (procalcitonin:4,lacticidven:4) )No results found for this or any previous visit (from the past 240 hour(s)).   Scheduled Meds: . carbamazepine  400 mg Oral BID  . Chlorhexidine Gluconate Cloth  6 each Topical Daily  . enoxaparin (LOVENOX) injection  40 mg Subcutaneous Q24H  . feeding supplement (OSMOLITE 1.5 CAL)  237 mL Per Tube 5 X Daily  . folic acid  1 mg Oral Daily  . gabapentin  300  mg Oral TID  . levETIRAcetam  1,000 mg Oral BID  . multivitamin with minerals  1 tablet Oral Daily  . pantoprazole  40 mg Oral BID AC  . thiamine  100 mg Oral Daily   Or  . thiamine  100 mg Intravenous Daily   Continuous Infusions: . 0.9 % NaCl with KCl 20 mEq / L 75 mL/hr at 10/17/20 1734    Procedures/Studies: No results found.  Orson Eva, DO  [facility]  If 7PM-7AM, please contact night-coverage [email or web address] Password [email or web address] 10/18/2020, 4:11 PM   LOS: 1 day

## 2020-10-18 NOTE — Progress Notes (Signed)
Blood sugar rechecked and is 145 mg/dl. Pt ate 25% of meal tray, tolerating small amount of melted ice cream at this time.

## 2020-10-18 NOTE — Progress Notes (Signed)
Initial Nutrition Assessment  DOCUMENTATION CODES:   Underweight (Suspect PCM)  INTERVENTION:  Increase bolus feedings - Osmolite 1.5 x 5 cartons/day via PEG with 30 ml free water flush before and after each feeding to maintain tube patency   Provides 1775 kcal, 75 grams protein, and 905 ml free water from formula (1205 ml total water with flushes)  Additional free water per MD  NUTRITION DIAGNOSIS:   Inadequate oral intake related to cancer and cancer related treatments as evidenced by  (s/p PEG placement to meet nutrition/hydration needs).    GOAL:   Patient will meet greater than or equal to 90% of their needs    MONITOR:   Labs,I & O's,Supplement acceptance,PO intake,Weight trends,TF tolerance  REASON FOR ASSESSMENT:   Consult Enteral/tube feeding initiation and management  ASSESSMENT:  RD working remotely.  44 year old male admitted for hyponatremia. Past medical history significant of pancreatitis, COPD, EtOH abuse, SAH, GERD, seizures, and squamous cell carcinoma of epiglottis currently undergoing concurrent chemoradiation therapy s/p PEG placement who presented to clinic for chemotherapy and labs returned with a sodium of 118.  12/20 - Cisplatin: Cycle 7  Patient is s/p PEG placement on 08/22/20  Patient is followed by outpt nutrition department at cancer center. Last RD contact on 10/07/20. Per notes, giving 3-4 cartons of Osmolite 1.5 and eating orally, but unable to provide recall. RD discussed importance of getting adequate nutrition and taking tube feeding regimen as prescribed.   Home tube feed regimen: - Osmolite 1.5 x 5 cartons/day with 60 ml water before and after each feeding provides 1775 kcal, 75 grams protein, and 1500 ml free water.   Spoke with pt via phone, reports eating a cold pancake, a little bit of sausage, and a cup of coffee for breakfast. Reports he has not been eating very much at home, recalls occasionally some macaroni and cheese and  chicken broth. Patient reports he continues to drink alcohol, states "I'm not drinking much, maybe one a day." He is giving 3-4 cartons of Osmolite 1.5 via PEG daily and rinses PEG with a little bit of water after feedings. He is unable to quantify how much water he is giving. Patient has not attempted to increase feedings, reports it takes too much time to set up and he is unable to tolerate 2 cartons in one feeding, states it makes him nauseas.  Per chart, weights have trended down 11 lbs (10%) in the last 2 months; significant. He is underweight and given significant history of epiglottis cancer currently undergoing chemoradiation therapies and reported inadequate nutrition intake, highly suspect malnutrition, however unable to identify at this time. Will plan to complete exam at follow-up.   RD reinforced the importance of nutrition and meeting goal of 5 cartons/day. Discussed trying 1.5 cartons for first and last feedings then 1 carton for the 2 other feedings. Educated on storing opened carton in the refrigerator and remove ~ 20 minutes prior to giving. Patient verbalizes understanding and is willing to try.   Medications reviewed and include: Carbamezapine, Folic acid, Gabapentin, Keppra, MVI, Thiamine  Labs: Na 127 (L) trending up, BUN <5 (L), Cr 0.33 (L), WBC 3.4 (L), Hgb 9.9 (L), HCT 28.9 (L)  NUTRITION - FOCUSED PHYSICAL EXAM: Unable to complete at this time, RD working remotely.   Diet Order:   Diet Order            DIET DYS 3 Room service appropriate? Yes; Fluid consistency: Thin  Diet effective now  EDUCATION NEEDS:   Education needs have been addressed  Skin:  Skin Assessment: Reviewed RN Assessment  Last BM:  12/20  Height:   Ht Readings from Last 1 Encounters:  10/18/20 5\' 9"  (1.753 m)    Weight:   Wt Readings from Last 1 Encounters:  10/18/20 44.4 kg    BMI:  Body mass index is 14.45 kg/m.  Estimated Nutritional Needs:   Kcal:   1500-1700  Protein:  75-85  Fluid:  1.5 L   Lajuan Lines, RD, LDN Clinical Nutrition After Hours/Weekend Pager # in [place]

## 2020-10-18 NOTE — Progress Notes (Signed)
Pt had blood sugar reading of 69 mg/dl per NT. Pt alert, oriented and skin warm/dry, denies any new complaint. Pt given apple juice 120 ml to drink. Blood sugar rechecked and down to 62 mg/dl. Pt remain alert, oriented, no new complaint. MD Tat notified and order received to administer one amp D50 (25gm) IV. Med admin'd without difficulty via implanted port. Pt's supper tray arrived to room and pt asked to eat as much as he felt he could and we would return to recheck blood sugar. Pt and wife state understanding.

## 2020-10-19 ENCOUNTER — Ambulatory Visit (HOSPITAL_COMMUNITY): Payer: Medicaid Other

## 2020-10-19 LAB — URINE CULTURE: Culture: NO GROWTH

## 2020-10-19 LAB — GLUCOSE, CAPILLARY
Glucose-Capillary: 77 mg/dL (ref 70–99)
Glucose-Capillary: 78 mg/dL (ref 70–99)
Glucose-Capillary: 118 mg/dL — ABNORMAL HIGH (ref 70–99)
Glucose-Capillary: 77 mg/dL (ref 70–99)

## 2020-10-19 LAB — BASIC METABOLIC PANEL
Anion gap: 7 (ref 5–15)
BUN: 8 mg/dL (ref 6–20)
CO2: 28 mmol/L (ref 22–32)
Calcium: 8.7 mg/dL — ABNORMAL LOW (ref 8.9–10.3)
Chloride: 90 mmol/L — ABNORMAL LOW (ref 98–111)
Creatinine, Ser: <0.3 mg/dL — ABNORMAL LOW (ref 0.61–1.24)
Glucose, Bld: 80 mg/dL (ref 70–99)
Potassium: 5.1 mmol/L (ref 3.5–5.1)
Sodium: 125 mmol/L — ABNORMAL LOW (ref 135–145)

## 2020-10-19 LAB — MAGNESIUM: Magnesium: 1.9 mg/dL (ref 1.7–2.4)

## 2020-10-19 MED ORDER — HEPARIN SOD (PORK) LOCK FLUSH 100 UNIT/ML IV SOLN
500.0000 [IU] | INTRAVENOUS | Status: AC | PRN
  Administered 2020-10-19: 13:00:00 500 [IU]
  Filled 2020-10-19: qty 5

## 2020-10-19 MED ORDER — LEVETIRACETAM 500 MG PO TABS: 1000.0000 mg | ORAL_TABLET | Freq: Two times a day (BID) | ORAL | 5 refills | Status: AC

## 2020-10-19 MED ORDER — THIAMINE HCL 100 MG PO TABS: 100.0000 mg | ORAL_TABLET | Freq: Every day | ORAL | 2 refills | Status: DC

## 2020-10-19 MED ORDER — TOPIRAMATE 25 MG PO TABS: 25.0000 mg | ORAL_TABLET | ORAL | 2 refills | Status: DC

## 2020-10-19 MED ORDER — ACETAMINOPHEN 325 MG PO TABS: 650.0000 mg | ORAL_TABLET | Freq: Four times a day (QID) | ORAL | 0 refills | Status: DC | PRN

## 2020-10-19 MED ORDER — PHENOL 1.4 % MT LIQD: 1.0000 | OROMUCOSAL | 0 refills | Status: DC | PRN

## 2020-10-19 MED ORDER — ONDANSETRON HCL 4 MG PO TABS: 4.0000 mg | ORAL_TABLET | Freq: Four times a day (QID) | ORAL | 0 refills | Status: DC | PRN

## 2020-10-19 MED ORDER — FOLIC ACID 1 MG PO TABS: 1.0000 mg | ORAL_TABLET | Freq: Every day | ORAL | 2 refills | Status: DC

## 2020-10-19 MED ORDER — ADULT MULTIVITAMIN W/MINERALS CH: 1.0000 | ORAL_TABLET | Freq: Every day | ORAL | 1 refills | Status: DC

## 2020-10-19 MED ORDER — DEXTROSE-NACL 5-0.9 % IV SOLN: INTRAVENOUS | Status: DC

## 2020-10-19 MED ORDER — MAGIC MOUTHWASH W/LIDOCAINE
15.0000 mL | Freq: Four times a day (QID) | ORAL | Status: DC | PRN
  Administered 2020-10-19: 15 mL via ORAL
  Filled 2020-10-19: qty 15

## 2020-10-19 MED ORDER — PANTOPRAZOLE SODIUM 40 MG PO TBEC: 40.0000 mg | DELAYED_RELEASE_TABLET | Freq: Every day | ORAL | 5 refills | Status: DC

## 2020-10-19 MED ORDER — ALBUTEROL SULFATE HFA 108 (90 BASE) MCG/ACT IN AERS: 2.0000 | INHALATION_SPRAY | Freq: Four times a day (QID) | RESPIRATORY_TRACT | 1 refills | Status: DC | PRN

## 2020-10-19 NOTE — Progress Notes (Signed)
Pt discharged to POV via wheelchair by staff. All personal belongings in his possession at time of discharge.

## 2020-10-19 NOTE — Progress Notes (Signed)
Tube feed completed, tube flushed easily with 50 cc water. Pt tolerated well. Port-a-cath deacessed without issue. Discharge instructions reviewed with pt and questions answered. Pt states understanding. Is currently waiting for ride to come pick him up.

## 2020-10-19 NOTE — Discharge Instructions (Signed)
1)Alcohol (Beer) is making your sodium low--- low sodium can cause seizures and tiredness 2)Stop Tegretol, start Topamax instead with 1 tablet twice a day for 1 week and then 2 tablets twice daily after that 3)Outpatient follow-up with Neurologist advised 4) please CBC and BMP blood test report care physician within a week advised 5) continue tube feeding 6) abstinence from Alcohol and tobacco strongly advised

## 2020-10-19 NOTE — Progress Notes (Signed)
Will hold 0600 feed patient is not feeling well and complaining of gas pain and bloating. MD notified. Nausea meds admin will continue to assess patient as needed.

## 2020-10-19 NOTE — Progress Notes (Signed)
Pt ambulated entire length of hallway without difficulty or SOB, SaO2 remained at 99-100% entire time. MD Courage notified.

## 2020-10-19 NOTE — Discharge Summary (Signed)
[name], is a 44 y.o. male  DOB 04-05-1976  MRN [id number].  Admission date:  10/17/2020  Admitting Physician  Orson Eva, MD  Discharge Date:  10/19/2020   Primary MD  Jani Gravel, MD  Recommendations for primary care physician for things to follow:   1)Alcohol [facility]) is making your sodium low--- low sodium can cause seizures and tiredness 2)Stop Tegretol, start Topamax instead with 1 tablet twice a day for 1 week and then 2 tablets twice daily after that 3)Outpatient follow-up with Neurologist advised 4) please CBC and BMP blood test report care physician within a week advised 5) continue tube feeding 6) abstinence from Alcohol and tobacco strongly advised  Admission Diagnosis  Hyponatremia [E87.1]   Discharge Diagnosis  Hyponatremia [E87.1]    Active Problems:   Tobacco abuse   Seizure disorder (HCC)   HTN (hypertension)   Hyponatremia   ETOH abuse   Squamous cell carcinoma of epiglottis (HCC)      Past Medical History:  Diagnosis Date   Back pain    Bradycardia 08/17/2011   Cancer ([place])    COPD (chronic obstructive pulmonary disease) ([place])    ETOH abuse    GERD (gastroesophageal reflux disease)    Macrocytosis 08/17/2011   Pancreatitis    Pneumonia    Port-A-Cath in place 09/04/2020   SAH (subarachnoid hemorrhage) ([place])    Seizures ([place])    last one 6 mos ago    Past Surgical History:  Procedure Laterality Date   BACK SURGERY     ESOPHAGOGASTRODUODENOSCOPY (EGD) WITH PROPOFOL N/A 08/14/2017   Procedure: ESOPHAGOGASTRODUODENOSCOPY (EGD) WITH PROPOFOL;  Surgeon: Daneil Dolin, MD;  [facility];  Service: Endoscopy;  Laterality: N/A;  1:30pm   ESOPHAGOGASTRODUODENOSCOPY (EGD) WITH PROPOFOL N/A 03/13/2018   Procedure: ESOPHAGOGASTRODUODENOSCOPY (EGD) WITH PROPOFOL;  Surgeon: Daneil Dolin, MD;  [facility]: [facility];  Service: Endoscopy;   Laterality: N/A;  2:30pm   ESOPHAGOGASTRODUODENOSCOPY (EGD) WITH PROPOFOL N/A 08/22/2020   Procedure: ESOPHAGOGASTRODUODENOSCOPY (EGD) WITH PROPOFOL;  Surgeon: Aviva Signs, MD;  Location: [facility] ORS;  Service: General;  Laterality: N/A;   PEG PLACEMENT N/A 08/22/2020   Procedure: PERCUTANEOUS ENDOSCOPIC GASTROSTOMY (PEG) PLACEMENT;  Surgeon: Aviva Signs, MD;  Location: [facility];  Service: General;  Laterality: N/A;   PORTACATH PLACEMENT Left 08/22/2020   Procedure: INSERTION PORT-A-CATH;  Surgeon: Aviva Signs, MD;  Location: [facility];  Service: General;  Laterality: Left;   SEPTOPLASTY        HPI  from the history and physical done on the day of admission:     HPI:  [name] is a 44 y.o. male with medical history of COPD, tobacco abuse, alcohol dependence, seizure disorder squamous cell carcinoma of the epiglottis, presented to his usual appointment to the [facility] on the morning of 10/17/2020 for his seventh cycle of cisplatin.  Routine blood work showed that he had a sodium of 118. The patient himself denies any f/c, cp, sob, diarrhea, abd pain, dysuria, hematuria.  He states that he normally gives himself 3-4 cans of Osmolite through his PEG tube daily.  He states he does take po but that the amount he has taken in has decreased in the past week, partly due to pain in his throat.  He denies any new meds.  He states that on the evening of 12/19, he had an episode of post-tussive emesis.  He has not had any emesis since then.  He denies any hemoptysis He continues to smoke about 1ppd and drink about 20 ounces of beer daily.  He denies any ilicit drug use.  He states his last XRT is due on 10/26/20 in [place].  He feels that he has had more generalized weakness in the past week than usual.  He denies any falls or syncope. At the [facility], he was afebrile and hemodynamically stable.  Direct admission was requested due to his weakness and low Na of 118.    Hospital Course:        Brief History:  44 y.o.malewith medical history ofCOPD, tobacco abuse, alcohol dependence, seizure disorder squamous cell carcinoma of the epiglottis, presented to his usual appointment to the [facility] on the morning of 10/17/2020 for his seventh cycle of cisplatin. Routine blood work showed that he had a sodium of 118. The patient himself denies any f/c, cp, sob, diarrhea, abd pain, dysuria, hematuria. He states that he normally gives himself 3-4 cans of Osmolite through his PEG tube daily. He states he does take po but that the amount he has taken in has decreased in the past week, partly due to pain in his throat. He denies any new meds. He states that on the evening of 12/19, he had an episode of post-tussive emesis. He has not had any emesis since then. He denies any hemoptysis He continues to smoke about 1ppd and drink about 20 ounces of beer daily. He denies any ilicit drug use. He states his last XRT is due on 10/26/20 in [place]. He feels that he has had more generalized weakness in the past week than usual. He denies any falls or syncope. At the [facility], he was afebrile and hemodynamically stable. Direct admission was requested due to his weakness and low Na of 118.  A/p Hyponatremia- -due to poor solute intake, beer potomania, hypovolemia and a degree SIADH -his Tegretol may also be contributing to this -continue isotonic saline and monitor serial Na -FeNa--1.32% -urine osm-445 -serum osm--279 -TSH--0.296 -Free T4--1.05 -switch tegretol to topamax  -Follow-up with PCP for repeat cortisol level when he is not acutely ill  Failure to Thrive -B12--698 -folate--23.5 -TSH--0.296 -UA--neg pyuria -PT eval -continue Osmolite 1.5--4 to 5 cans daily   Squamous cell carcinoma of the Epiglottis -initially diagnosed 08/22/20 -follows Dr. Delton Coombes -received cycle 7 cisplatin 12/20 -due to finish XRT  12/29  Alcohol abuse -Alcohol cessation advised, multivitamin advised  Right neck pain -continue home dose oxycodone  Tobacco Abuse -continues to smoke up to 1ppd -has about 30 pack years -cessation discussed  Seizure disorder -continue home meds for now, but  switched off of his carbamazepine to Topamax due to persistent hyponatremia -continue keppra -discussed with Epilleptology--Dr. Ellouise Newer  Discharge Condition: stable  Follow UP--PCP  Diet and Activity recommendation:  As advised  Discharge Instructions    Discharge Instructions    Call MD for:  difficulty breathing, headache or visual disturbances   Complete by: As directed    Call MD for:  persistant dizziness or light-headedness   Complete by: As directed    Call MD for:  persistant nausea and vomiting   Complete by: As directed    Call MD for:  temperature >100.4   Complete by: As directed    Diet general   Complete by: As directed    Tube feeding via PEG tube   Discharge instructions   Complete by: As directed    1)Alcohol [facility]) is making your sodium low--- low sodium can cause seizures and tiredness 2)Stop Tegretol, start Topamax instead with 1 tablet twice a day for 1 week and then 2 tablets twice daily after that 3)Outpatient follow-up with Neurologist advised 4) please CBC and BMP blood test report care physician within a week advised 5) continue tube feeding 6) abstinence from Alcohol and tobacco strongly advised   Increase activity slowly   Complete by: As directed         Discharge Medications     Allergies as of 10/19/2020   No Known Allergies     Medication List    STOP taking these medications   carbamazepine 200 MG tablet Commonly known as: TEGRETOL   ibuprofen 200 MG tablet Commonly known as: ADVIL     TAKE these medications   acetaminophen 325 MG tablet Commonly known as: TYLENOL Take 2 tablets (650 mg total) by mouth every 6 (six) hours as needed for mild pain (or  Fever >/= 101).   albuterol (2.5 MG/3ML) 0.083% nebulizer solution Commonly known as: PROVENTIL Take 2.5 mg by nebulization every 6 (six) hours as needed for wheezing or shortness of breath.   albuterol 108 (90 Base) MCG/ACT inhaler Commonly known as: VENTOLIN HFA Inhale 2 puffs into the lungs every 6 (six) hours as needed for wheezing or shortness of breath.   amLODipine 10 MG tablet Commonly known as: NORVASC Take 10 mg by mouth daily.   chlordiazePOXIDE 25 MG capsule Commonly known as: LIBRIUM Take 1 capsule (25 mg total) by mouth every 6 (six) hours.   CISPLATIN IV Inject into the vein once a week.   dexamethasone 0.5 MG/5ML elixir Take by mouth.   diclofenac Sodium 1 % Gel Commonly known as: VOLTAREN Apply 2 g topically daily as needed (pain).   diphenhydrAMINE 12.5 MG/5ML elixir Commonly known as: BENADRYL Take 10 mLs by mouth in the morning, at noon, in the evening, and at bedtime.   folic acid 1 MG tablet Commonly known as: FOLVITE Take 1 tablet (1 mg total) by mouth daily. Start taking on: October 20, 2020   gabapentin 300 MG capsule Commonly known as: NEURONTIN Take 300 mg by mouth 3 (three) times daily.   levETIRAcetam 500 MG tablet Commonly known as: KEPPRA Take 2 tablets (1,000 mg total) by mouth 2 (two) times daily.   lidocaine-prilocaine cream Commonly known as: EMLA Apply small amount to port a cath site and cover with plastic wrap 1 hour prior to chemotherapy appointments   multivitamin with minerals Tabs tablet Take 1 tablet by mouth daily. Start taking on: October 20, 2020   ondansetron 4 MG tablet Commonly known as: ZOFRAN Take 1 tablet (4 mg total) by mouth every 6 (six) hours as needed for nausea.   Oxycodone HCl 10 MG Tabs Take 1 tablet (10 mg total) by mouth every 4 (four) hours as needed.   pantoprazole 40 MG tablet Commonly known as: PROTONIX Take 1 tablet (40 mg total) by mouth daily. What changed: when to take this    phenol 1.4 %  Liqd Commonly known as: CHLORASEPTIC Use as directed 1 spray in the mouth or throat as needed for throat irritation / pain.   prochlorperazine 10 MG tablet Commonly known as: COMPAZINE Take 1 tablet (10 mg total) by mouth every 6 (six) hours as needed for nausea or vomiting.   SSD 1 % cream Generic drug: silver sulfADIAZINE Apply topically in the morning and at bedtime.   thiamine 100 MG tablet Take 1 tablet (100 mg total) by mouth daily. Start taking on: October 20, 2020   topiramate 25 MG tablet Commonly known as: Topamax Take 1 tablet (25 mg total) by mouth See admin instructions. Take 1 tablet (25 mg) twice daily for 1 week, then increase to 2 tablets (50 mg) twice daily      Major procedures and Radiology Reports - PLEASE review detailed and final reports for all details, in brief -   No results found.  Micro Results   Recent Results (from the past 240 hour(s))  Culture, Urine     Status: None   Collection Time: 10/17/20  9:43 PM   Specimen: Urine, Random  Result Value Ref Range Status   Specimen Description   Final    URINE, RANDOM Performed at [facility], [address] [place], [place] [address]    Special Requests   Final    NONE Performed at [facility], [address] [place], [place] [address]    Culture   Final    NO GROWTH Performed at [facility], [place] [address] [place], [place] [address]    Report Status 10/19/2020 FINAL  Final    Today   Subjective    [name] today has no new complaints No fever  Or chills   No Nausea, Vomiting or Diarrhea        Patient has been seen and examined prior to discharge   Objective   Blood pressure 128/70, pulse 72, temperature 98.2 F (36.8 C), resp. rate 18, height 5\' 9"  (1.753 m), weight 44.4 kg, SpO2 93 %.   Intake/Output Summary (Last 24 hours) at 10/19/2020 1055 Last data filed at 10/18/2020 2153 Gross per 24 hour  Intake 240 ml  Output --  Net 240 ml     Exam Gen:- Awake Alert, no acute distress  HEENT:- [place].AT, No sclera icterus Neck-Supple Neck,No JVD,.  Lungs-  CTAB , good air movement bilaterally  CV- S1, S2 normal, regular Abd-  +ve B.Sounds, Abd Soft, No tenderness, feeding tube Extremity/Skin:- No  edema,   good pulses Psych-affect is appropriate, oriented x3 Neuro-no new focal deficits, no tremors    Data Review   CBC w Diff:  Lab Results  Component Value Date   WBC 3.4 (L) 10/18/2020   HGB 9.9 (L) 10/18/2020   HCT 28.9 (L) 10/18/2020   PLT 151 10/18/2020   LYMPHOPCT 12 10/17/2020   MONOPCT 17 10/17/2020   EOSPCT 1 10/17/2020   BASOPCT 0 10/17/2020    CMP:  Lab Results  Component Value Date   NA 125 (L) 10/19/2020   K 5.1 10/19/2020   CL 90 (L) 10/19/2020   CO2 28 10/19/2020   BUN 8 10/19/2020   CREATININE <0.30 (L) 10/19/2020   PROT 7.4 10/17/2020   ALBUMIN 3.8 10/17/2020   BILITOT 0.3 10/17/2020   ALKPHOS 92 10/17/2020   AST 19 10/17/2020   ALT 15 10/17/2020  .   Total Discharge time is about 33 minutes  Roxan Hockey M.D on 10/19/2020 at 10:55 AM  Go to [email or web address] -  for contact info  [facility] - Office  [phone]

## 2020-10-24 NOTE — Progress Notes (Signed)
CRITICAL VALUE ALERT  Critical Value:  Na++ 118  Date & Time Notied:  10/17/2020 at 0950  Provider Notified: V. Tanner, PA-C  Orders Received/Actions taken: pt seen and assessed by provider; will be admitted for hyponatremia

## 2020-11-01 ENCOUNTER — Other Ambulatory Visit: Payer: Self-pay

## 2020-11-01 ENCOUNTER — Other Ambulatory Visit (HOSPITAL_COMMUNITY): Payer: Self-pay

## 2020-11-01 ENCOUNTER — Inpatient Hospital Stay (HOSPITAL_COMMUNITY): Payer: Medicaid Other | Attending: Hematology

## 2020-11-01 DIAGNOSIS — C321 Malignant neoplasm of supraglottis: Secondary | ICD-10-CM | POA: Insufficient documentation

## 2020-11-01 DIAGNOSIS — C01 Malignant neoplasm of base of tongue: Secondary | ICD-10-CM | POA: Diagnosis not present

## 2020-11-01 DIAGNOSIS — Z931 Gastrostomy status: Secondary | ICD-10-CM | POA: Diagnosis not present

## 2020-11-01 DIAGNOSIS — E871 Hypo-osmolality and hyponatremia: Secondary | ICD-10-CM | POA: Diagnosis not present

## 2020-11-01 DIAGNOSIS — R634 Abnormal weight loss: Secondary | ICD-10-CM | POA: Diagnosis not present

## 2020-11-01 DIAGNOSIS — M542 Cervicalgia: Secondary | ICD-10-CM | POA: Insufficient documentation

## 2020-11-01 DIAGNOSIS — F17210 Nicotine dependence, cigarettes, uncomplicated: Secondary | ICD-10-CM | POA: Diagnosis not present

## 2020-11-01 LAB — COMPREHENSIVE METABOLIC PANEL
ALT: 14 U/L (ref 0–44)
AST: 17 U/L (ref 15–41)
Albumin: 3.8 g/dL (ref 3.5–5.0)
Alkaline Phosphatase: 110 U/L (ref 38–126)
Anion gap: 11 (ref 5–15)
BUN: <5 mg/dL — ABNORMAL LOW (ref 6–20)
CO2: 30 mmol/L (ref 22–32)
Calcium: 9.4 mg/dL (ref 8.9–10.3)
Chloride: 82 mmol/L — ABNORMAL LOW (ref 98–111)
Creatinine, Ser: <0.3 mg/dL — ABNORMAL LOW (ref 0.61–1.24)
Glucose, Bld: 110 mg/dL — ABNORMAL HIGH (ref 70–99)
Potassium: 3.4 mmol/L — ABNORMAL LOW (ref 3.5–5.1)
Sodium: 123 mmol/L — ABNORMAL LOW (ref 135–145)
Total Bilirubin: 0.5 mg/dL (ref 0.3–1.2)
Total Protein: 7.6 g/dL (ref 6.5–8.1)

## 2020-11-01 LAB — CBC WITH DIFFERENTIAL/PLATELET
Abs Immature Granulocytes: 0.02 10*3/uL (ref 0.00–0.07)
Basophils Absolute: 0 10*3/uL (ref 0.0–0.1)
Basophils Relative: 0 %
Eosinophils Absolute: 0.1 10*3/uL (ref 0.0–0.5)
Eosinophils Relative: 1 %
HCT: 28.6 % — ABNORMAL LOW (ref 39.0–52.0)
Hemoglobin: 10.2 g/dL — ABNORMAL LOW (ref 13.0–17.0)
Immature Granulocytes: 1 %
Lymphocytes Relative: 16 %
Lymphs Abs: 0.6 10*3/uL — ABNORMAL LOW (ref 0.7–4.0)
MCH: 36.3 pg — ABNORMAL HIGH (ref 26.0–34.0)
MCHC: 35.7 g/dL (ref 30.0–36.0)
MCV: 101.8 fL — ABNORMAL HIGH (ref 80.0–100.0)
Monocytes Absolute: 1 10*3/uL (ref 0.1–1.0)
Monocytes Relative: 28 %
Neutro Abs: 2 10*3/uL (ref 1.7–7.7)
Neutrophils Relative %: 54 %
Platelets: 139 10*3/uL — ABNORMAL LOW (ref 150–400)
RBC: 2.81 MIL/uL — ABNORMAL LOW (ref 4.22–5.81)
RDW: 16.3 % — ABNORMAL HIGH (ref 11.5–15.5)
WBC: 3.6 10*3/uL — ABNORMAL LOW (ref 4.0–10.5)
nRBC: 0 % (ref 0.0–0.2)

## 2020-11-01 LAB — MAGNESIUM: Magnesium: 1.8 mg/dL (ref 1.7–2.4)

## 2020-11-01 NOTE — Progress Notes (Signed)
Patients port flushed without difficulty.  Good blood return noted with no bruising or swelling noted at site.  Band aid applied.  VSS with discharge and left in satisfactory condition with no s/s of distress noted.   

## 2020-11-02 ENCOUNTER — Inpatient Hospital Stay (HOSPITAL_BASED_OUTPATIENT_CLINIC_OR_DEPARTMENT_OTHER): Payer: Medicaid Other | Admitting: Hematology

## 2020-11-02 VITALS — BP 117/76 | HR 90 | Temp 97.7°F | Resp 18 | Wt 99.8 lb

## 2020-11-02 DIAGNOSIS — C321 Malignant neoplasm of supraglottis: Secondary | ICD-10-CM | POA: Diagnosis not present

## 2020-11-02 DIAGNOSIS — C01 Malignant neoplasm of base of tongue: Secondary | ICD-10-CM | POA: Diagnosis not present

## 2020-11-02 NOTE — Progress Notes (Signed)
[name], [name]   CLINIC:  Medical Oncology/Hematology  PCP:  Jani Gravel, MD [address] / [place] [address] [phone]   REASON FOR VISIT:  Follow-up for epiglottic squamous cell carcinoma  PRIOR THERAPY: Concurrent chemoradiation with cisplatin x 7 cycles through 10/17/2020 and radiation through 10/31/2020  NGS Results: Not done  CURRENT THERAPY: Observation  BRIEF ONCOLOGIC HISTORY:  Oncology History  Squamous cell carcinoma of epiglottis ([place])  08/22/2020 Initial Diagnosis   Squamous cell carcinoma of epiglottis ([place])   09/05/2020 -  Chemotherapy   The patient had palonosetron (ALOXI) injection 0.25 mg, 0.25 mg, Intravenous,  Once, 7 of 7 cycles Administration: 0.25 mg (09/05/2020), 0.25 mg (09/12/2020), 0.25 mg (09/19/2020), 0.25 mg (09/26/2020), 0.25 mg (10/03/2020), 0.25 mg (10/10/2020), 0.25 mg (10/17/2020) CISplatin (PLATINOL) 60 mg in sodium chloride 0.9 % 250 mL chemo infusion, 40 mg/m2 = 60 mg, Intravenous,  Once, 7 of 7 cycles Administration: 60 mg (09/05/2020), 60 mg (09/12/2020), 60 mg (09/19/2020), 60 mg (09/26/2020), 60 mg (10/03/2020), 60 mg (10/10/2020), 60 mg (10/17/2020) fosaprepitant (EMEND) 150 mg in sodium chloride 0.9 % 145 mL IVPB, 150 mg, Intravenous,  Once, 7 of 7 cycles Administration: 150 mg (09/05/2020), 150 mg (09/12/2020), 150 mg (09/19/2020), 150 mg (09/26/2020), 150 mg (10/03/2020), 150 mg (10/10/2020), 150 mg (10/17/2020)  for chemotherapy treatment.      CANCER STAGING: Cancer Staging No matching staging information was found for the patient.  INTERVAL HISTORY:  [name], a 45 y.o. male, returns for routine follow-up of his epiglottic squamous cell carcinoma. [name] was last seen by Jose Mealy, PA, on 10/17/2020.   Today he is accompanied by his sister and he reports feeling poorly. He finished his radiation on 10/31/2020. He continues putting in 3 cans of  Osmolite into his tube feeds, but is not taking in any chicken broth. He reports that he is not putting beer into the tube feeds. He continues taking Keppra and denies having any recent seizures. He has not taken any Librium for the past several days since he ran out. He continues complaining of bilateral face and neck pain and continues taking oxycodone 10 mg every 4 hours. He denies having tinnitus, numbness or tingling, N/V/D, or recent infections.   REVIEW OF SYSTEMS:  Review of Systems  Constitutional: Positive for appetite change (depleted) and fatigue (depleted).  HENT:   Positive for trouble swallowing. Negative for tinnitus.   Gastrointestinal: Negative for diarrhea, nausea and vomiting.  Musculoskeletal: Positive for neck pain (face & neck pain).  Neurological: Negative for numbness and seizures.  All other systems reviewed and are negative.   PAST MEDICAL/SURGICAL HISTORY:  Past Medical History:  Diagnosis Date  . Back pain   . Bradycardia 08/17/2011  . Cancer ([place])   . COPD (chronic obstructive pulmonary disease) ([place])   . ETOH abuse   . GERD (gastroesophageal reflux disease)   . Macrocytosis 08/17/2011  . Pancreatitis   . Pneumonia   . Port-A-Cath in place 09/04/2020  . SAH (subarachnoid hemorrhage) ([place])   . Seizures ([place])    last one 6 mos ago   Past Surgical History:  Procedure Laterality Date  . BACK SURGERY    . ESOPHAGOGASTRODUODENOSCOPY (EGD) WITH PROPOFOL N/A 08/14/2017   Procedure: ESOPHAGOGASTRODUODENOSCOPY (EGD) WITH PROPOFOL;  Surgeon: Daneil Dolin, MD;  [facility];  Service: Endoscopy;  Laterality: N/A;  1:30pm  . ESOPHAGOGASTRODUODENOSCOPY (EGD) WITH PROPOFOL N/A 03/13/2018  Procedure: ESOPHAGOGASTRODUODENOSCOPY (EGD) WITH PROPOFOL;  Surgeon: Daneil Dolin, MD;  [facility]: [facility];  Service: Endoscopy;  Laterality: N/A;  2:30pm  . ESOPHAGOGASTRODUODENOSCOPY (EGD) WITH PROPOFOL N/A 08/22/2020   Procedure: ESOPHAGOGASTRODUODENOSCOPY  (EGD) WITH PROPOFOL;  Surgeon: Aviva Signs, MD;  Location: [facility];  Service: General;  Laterality: N/A;  . PEG PLACEMENT N/A 08/22/2020   Procedure: PERCUTANEOUS ENDOSCOPIC GASTROSTOMY (PEG) PLACEMENT;  Surgeon: Aviva Signs, MD;  Location: [facility];  Service: General;  Laterality: N/A;  . PORTACATH PLACEMENT Left 08/22/2020   Procedure: INSERTION PORT-A-CATH;  Surgeon: Aviva Signs, MD;  Location: [facility];  Service: General;  Laterality: Left;  . SEPTOPLASTY      SOCIAL HISTORY:  Social History   Socioeconomic History  . Marital status: Single    Spouse name: Not on file  . Number of children: Not on file  . Years of education: Not on file  . Highest education level: Not on file  Occupational History  . Not on file  Tobacco Use  . Smoking status: Current Every Day Smoker    Packs/day: 1.00    Years: 20.00    Pack years: 20.00    Types: Cigarettes  . Smokeless tobacco: Never Used  Vaping Use  . Vaping Use: Never used  Substance and Sexual Activity  . Alcohol use: Yes    Comment: couple of beers daily  . Drug use: Not Currently    Types: Marijuana    Comment: months   . Sexual activity: Not Currently  Other Topics Concern  . Not on file  Social History Narrative  . Not on file   Social Determinants of Health   Financial Resource Strain: Low Risk   . Difficulty of Paying Living Expenses: Not very hard  Food Insecurity: No Food Insecurity  . Worried About Charity fundraiser in the Last Year: Never true  . Ran Out of Food in the Last Year: Never true  Transportation Needs: No Transportation Needs  . Lack of Transportation (Medical): No  . Lack of Transportation (Non-Medical): No  Physical Activity: Sufficiently Active  . Days of Exercise per Week: 7 days  . Minutes of Exercise per Session: 60 min  Stress: Stress Concern Present  . Feeling of Stress : To some extent  Social Connections: Moderately Isolated  . Frequency of Communication with Friends and Family:  More than three times a week  . Frequency of Social Gatherings with Friends and Family: More than three times a week  . Attends Religious Services: Never  . Active Member of Clubs or Organizations: No  . Attends Archivist Meetings: Never  . Marital Status: Living with partner  Intimate Partner Violence: Not At Risk  . Fear of Current or Ex-Partner: No  . Emotionally Abused: No  . Physically Abused: No  . Sexually Abused: No    FAMILY HISTORY:  Family History  Problem Relation Age of Onset  . Coronary artery disease Mother        deceased age 78  . Seizures Father   . Alcohol abuse Father   . Colon cancer Neg Hx     CURRENT MEDICATIONS:  Current Outpatient Medications  Medication Sig Dispense Refill  . amLODipine (NORVASC) 10 MG tablet Take 10 mg by mouth daily.     . chlordiazePOXIDE (LIBRIUM) 25 MG capsule Take 1 capsule (25 mg total) by mouth every 6 (six) hours. 30 capsule 0  . CISPLATIN IV Inject into the vein once a week.    Marland Kitchen  dexamethasone 0.5 MG/5ML elixir Take by mouth.    . diclofenac Sodium (VOLTAREN) 1 % GEL Apply 2 g topically daily as needed (pain).     Marland Kitchen diphenhydrAMINE (BENADRYL) 12.5 MG/5ML elixir Take 10 mLs by mouth in the morning, at noon, in the evening, and at bedtime.    . folic acid (FOLVITE) 1 MG tablet Take 1 tablet (1 mg total) by mouth daily. 30 tablet 2  . gabapentin (NEURONTIN) 300 MG capsule Take 300 mg by mouth 3 (three) times daily.    Marland Kitchen levETIRAcetam (KEPPRA) 500 MG tablet Take 2 tablets (1,000 mg total) by mouth 2 (two) times daily. 60 tablet 5  . lidocaine-prilocaine (EMLA) cream Apply small amount to port a cath site and cover with plastic wrap 1 hour prior to chemotherapy appointments 30 g 3  . Multiple Vitamin (MULTIVITAMIN WITH MINERALS) TABS tablet Take 1 tablet by mouth daily. 90 tablet 1  . pantoprazole (PROTONIX) 40 MG tablet Take 1 tablet (40 mg total) by mouth daily. 30 tablet 5  . phenol (CHLORASEPTIC) 1.4 % LIQD Use as  directed 1 spray in the mouth or throat as needed for throat irritation / pain. 118 mL 0  . SSD 1 % cream Apply topically in the morning and at bedtime.    . thiamine 100 MG tablet Take 1 tablet (100 mg total) by mouth daily. 30 tablet 2  . topiramate (TOPAMAX) 25 MG tablet Take 1 tablet (25 mg total) by mouth See admin instructions. Take 1 tablet (25 mg) twice daily for 1 week, then increase to 2 tablets (50 mg) twice daily 120 tablet 2  . acetaminophen (TYLENOL) 325 MG tablet Take 2 tablets (650 mg total) by mouth every 6 (six) hours as needed for mild pain (or Fever >/= 101). (Patient not taking: Reported on 11/02/2020) 12 tablet 0  . albuterol (PROVENTIL) (2.5 MG/3ML) 0.083% nebulizer solution Take 2.5 mg by nebulization every 6 (six) hours as needed for wheezing or shortness of breath. (Patient not taking: Reported on 11/02/2020)    . albuterol (VENTOLIN HFA) 108 (90 Base) MCG/ACT inhaler Inhale 2 puffs into the lungs every 6 (six) hours as needed for wheezing or shortness of breath. (Patient not taking: Reported on 11/02/2020) 18 g 1  . ondansetron (ZOFRAN) 4 MG tablet Take 1 tablet (4 mg total) by mouth every 6 (six) hours as needed for nausea. (Patient not taking: Reported on 11/02/2020) 20 tablet 0  . Oxycodone HCl 10 MG TABS Take 1 tablet (10 mg total) by mouth every 4 (four) hours as needed. (Patient not taking: Reported on 11/02/2020) 180 tablet 0  . prochlorperazine (COMPAZINE) 10 MG tablet Take 1 tablet (10 mg total) by mouth every 6 (six) hours as needed for nausea or vomiting. (Patient not taking: Reported on 11/02/2020) 30 tablet 2   No current facility-administered medications for this visit.    ALLERGIES:  No Known Allergies  PHYSICAL EXAM:  Performance status (ECOG): 1 - Symptomatic but completely ambulatory  Vitals:   11/02/20 1507  BP: 117/76  Pulse: 90  Resp: 18  Temp: 97.7 F (36.5 C)  SpO2: 100%   Wt Readings from Last 3 Encounters:  11/02/20 99 lb 12.8 oz (45.3 kg)   10/19/20 97 lb 14.2 oz (44.4 kg)  10/17/20 103 lb 12.8 oz (47.1 kg)   Physical Exam Vitals reviewed.  Constitutional:      Appearance: Normal appearance.  HENT:     Mouth/Throat:     Lips: No lesions.  Mouth: Oral lesions (erythema on buccal mucosa) present.     Palate: Lesions (erythema on soft palate) present.  Cardiovascular:     Rate and Rhythm: Normal rate and regular rhythm.     Pulses: Normal pulses.     Heart sounds: Normal heart sounds.  Pulmonary:     Effort: Pulmonary effort is normal.     Breath sounds: Normal breath sounds.  Abdominal:     Palpations: Abdomen is soft.     Tenderness: There is no abdominal tenderness.     Comments: PEG tube  Musculoskeletal:     Right lower leg: No edema.     Left lower leg: No edema.  Lymphadenopathy:     Cervical: Cervical adenopathy present.     Right cervical: Superficial cervical adenopathy (level 3 1 cm LN on anterior neck) present.     Left cervical: No superficial cervical adenopathy.  Neurological:     General: No focal deficit present.     Mental Status: He is alert and oriented to person, place, and time.  Psychiatric:        Mood and Affect: Mood normal.        Behavior: Behavior normal.      LABORATORY DATA:  I have reviewed the labs as listed.  CBC Latest Ref Rng & Units 11/01/2020 10/18/2020 10/17/2020  WBC 4.0 - 10.5 K/uL 3.6(L) 3.4(L) 5.5  Hemoglobin 13.0 - 17.0 g/dL 10.2(L) 9.9(L) 10.2(L)  Hematocrit 39.0 - 52.0 % 28.6(L) 28.9(L) 28.5(L)  Platelets 150 - 400 K/uL 139(L) 151 132(L)   CMP Latest Ref Rng & Units 11/01/2020 10/19/2020 10/18/2020  Glucose 70 - 99 mg/dL 110(H) 80 72  BUN 6 - 20 mg/dL <5(L) 8 5(L)  Creatinine 0.61 - 1.24 mg/dL <0.30(L) <0.30(L) <0.30(L)  Sodium 135 - 145 mmol/L 123(L) 125(L) 126(L)  Potassium 3.5 - 5.1 mmol/L 3.4(L) 5.1 4.0  Chloride 98 - 111 mmol/L 82(L) 90(L) 94(L)  CO2 22 - 32 mmol/L 30 28 26   Calcium 8.9 - 10.3 mg/dL 9.4 8.7(L) 8.2(L)  Total Protein 6.5 - 8.1 g/dL  7.6 - -  Total Bilirubin 0.3 - 1.2 mg/dL 0.5 - -  Alkaline Phos 38 - 126 U/L 110 - -  AST 15 - 41 U/L 17 - -  ALT 0 - 44 U/L 14 - -    DIAGNOSTIC IMAGING:  I have independently reviewed the scans and discussed with the patient. No results found.   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 -Ultrasound of the neck on 04/28/2020 with 2.8 x 1.7 cm mass on the right neck and several relatively small hypoechoic lymph nodes in the left side. -Weight loss of 22 pounds in the last 3 months. No fevers or night sweats. -Positive for dysphagia. -PET CT scan on 08/05/2020 shows epiglottis is thickened and irregular with hypermetabolic. Left level 2 lymph node positive. Right-sided level 2 nodal metastasis measuring 2.7 x 2.2 cm. Bilateral areas of peribronchovascular reticulonodular opacities with hypermetabolism favoring infection. No extra cervical metastatic disease. Thoracic nodes with low-level of hypermetabolism favored to be reactive. -Fiberoptic exam by Dr. Benjamine Mola on 08/02/2020 showed tongue base mass noted that involved the superior aspect of the epiglottis. Arytenoid mucosa was edematous with slight erythema. True vocal cords were pale yellow and edematous but without mass or lesion. -FNA of the right neck lymph node on 08/02/2020 consistent with squamous cell carcinoma. -Chemoradiation therapy with weekly cisplatin  from 09/05/2020 through week 7 of chemotherapy on 10/17/2020 and XRT ended on 10/31/2020.  2. Social/family history: -Worked in the roofing, now on disability. -1 pack/day for 30 years, active smoker. -3 beers per day for the last 10 years.   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 -Physical examination today shows no oropharyngeal lesions.  However there is right level 3 lymph node  palpable. -Recommend CT scan of the neck with contrast in 4 to 5 weeks. -We will also check labs and TSH.  2. Weight loss: -He is taking in 3 cans of Osmolite 1.5 via PEG tube. -I have strongly recommended increasing it to 4 cans as his weight is lingering around 100 pounds.  3. Right neck pain: -He is continuing oxycodone 10 mg every 4-6 hours as needed.  4. Ongoing EtOH use: -He reports that he is not drinking beer at all. -He also completed Librium protocol.  5. 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 -He was told to increase salt intake.  Sodium today is 123.   Orders placed this encounter:  Orders Placed This Encounter  Procedures  . CT SOFT TISSUE NECK W CONTRAST  . CBC with Differential/Platelet  . Comprehensive metabolic panel  . TSH  . Magnesium     Doreatha Massed, MD [facility] [phone]   I, Drue Second, am acting as a scribe for Dr. Payton Mccallum.  I, Doreatha Massed MD, have reviewed the above documentation for accuracy and completeness, and I agree with the above.

## 2020-11-02 NOTE — Patient Instructions (Signed)
[facility] at [facility] Discharge Instructions  You were seen today by Dr. Ellin Saba. He went over your recent results. Start taking in 4 cans of Osmolite daily through the PEG tube to maintain your weight and energy levels. You will be scheduled for a CT scan of your neck before your next visit. Dr. Ellin Saba will see you back in 6 weeks for labs and follow up.   Thank you for choosing [facility] at [facility] to provide your oncology and hematology care.  To afford each patient quality time with our provider, please arrive at least 15 minutes before your scheduled appointment time.   If you have a lab appointment with the Cancer Center please come in thru the Main Entrance and check in at the main information desk  You need to re-schedule your appointment should you arrive 10 or more minutes late.  We strive to give you quality time with our providers, and arriving late affects you and other patients whose appointments are after yours.  Also, if you no show three or more times for appointments you may be dismissed from the clinic at the providers discretion.     Again, thank you for choosing [facility].  Our hope is that these requests will decrease the amount of time that you wait before being seen by our physicians.       _____________________________________________________________  Should you have questions after your visit to [facility], please contact our office at [phone] between the hours of 8:00 a.m. and 4:30 p.m.  Voicemails left after 4:00 p.m. will not be returned until the following business day.  For prescription refill requests, have your pharmacy contact our office and allow 72 hours.    Cancer Center Support Programs:   > Cancer Support Group  2nd Tuesday of the month 1pm-2pm, Journey Room

## 2020-11-04 ENCOUNTER — Ambulatory Visit (HOSPITAL_COMMUNITY): Payer: Medicaid Other

## 2020-11-04 NOTE — Progress Notes (Signed)
Nutrition Follow-up:  Patient with squamous cell carcinoma of epiglottis.  Patient has completed chemotherapy and radiation (1/3).  Noted hospital admission for hyponatremia on 12/20-22.    Spoke with patient via phone.  Reports that he is given tube feeding at this time. This is the first one of the day.  Says that he has been giving 4 cartons of osmolite 1.5 for the last few days. Previously giving 3 cartons.  Says that he is going to stay up late tonight and get 4 more cartons in today.  Denies nausea. Reports had bowel movement this am, not diarrhea.  Says that eat at a little bit of fish but has no taste buds and mouth burns.  Drinking fluids orally.      Medications: reviewed  Labs: Na 124  Anthropometrics:   Weight 99 lb on 1/5 decreased from 103 lb 12/20   Estimated Energy Needs  Kcals: 1500-1700 Protein: 75-85 g Fluid: 1.5 L  NUTRITION DIAGNOSIS: Inadequate oral intake continues receiving nutrition from feeding tube   INTERVENTION:  Reminded patient of the importance of adequate nutrition for recovery and healing after treatment is over Encouraged 5 cartons of osmolite 1.5 daily to better meet nutritional needs (1 carton q 3 hours).   Patient to eat orally as able.  We discussed beverages containing sodium that he can drink.      MONITORING, EVALUATION, GOAL: weight trends, intake, tube feeding   NEXT VISIT: Feb 4 th phone f/u  Dandrae Kustra B. Zenia Resides, [place], [place] Registered Dietitian [phone] (mobile)

## 2020-11-07 ENCOUNTER — Other Ambulatory Visit (HOSPITAL_COMMUNITY): Payer: Self-pay

## 2020-11-07 DIAGNOSIS — C321 Malignant neoplasm of supraglottis: Secondary | ICD-10-CM

## 2020-11-07 MED ORDER — OXYCODONE HCL 10 MG PO TABS: 10.0000 mg | ORAL_TABLET | ORAL | 0 refills | Status: DC | PRN

## 2020-11-20 ENCOUNTER — Emergency Department (HOSPITAL_COMMUNITY)
Admission: EM | Admit: 2020-11-20 | Discharge: 2020-11-20 | Disposition: A | Discharge status: Home / Self Care | Payer: Medicaid Other | Attending: Emergency Medicine | Admitting: Emergency Medicine

## 2020-11-20 ENCOUNTER — Other Ambulatory Visit: Payer: Self-pay

## 2020-11-20 ENCOUNTER — Emergency Department (HOSPITAL_COMMUNITY): Payer: Medicaid Other

## 2020-11-20 ENCOUNTER — Encounter (HOSPITAL_COMMUNITY): Payer: Self-pay

## 2020-11-20 DIAGNOSIS — F458 Other somatoform disorders: Secondary | ICD-10-CM

## 2020-11-20 DIAGNOSIS — F17210 Nicotine dependence, cigarettes, uncomplicated: Secondary | ICD-10-CM | POA: Diagnosis not present

## 2020-11-20 DIAGNOSIS — J029 Acute pharyngitis, unspecified: Secondary | ICD-10-CM | POA: Diagnosis not present

## 2020-11-20 DIAGNOSIS — B3781 Candidal esophagitis: Secondary | ICD-10-CM

## 2020-11-20 DIAGNOSIS — Z859 Personal history of malignant neoplasm, unspecified: Secondary | ICD-10-CM | POA: Insufficient documentation

## 2020-11-20 DIAGNOSIS — J449 Chronic obstructive pulmonary disease, unspecified: Secondary | ICD-10-CM | POA: Insufficient documentation

## 2020-11-20 MED ORDER — OXYCODONE HCL 5 MG PO TABS
10.0000 mg | ORAL_TABLET | ORAL | Status: DC | PRN
  Administered 2020-11-20: 10 mg via ORAL
  Filled 2020-11-20: qty 2

## 2020-11-20 MED ORDER — LORAZEPAM 1 MG PO TABS
1.0000 mg | ORAL_TABLET | Freq: Once | ORAL | Status: AC
  Administered 2020-11-20: 1 mg via ORAL
  Filled 2020-11-20: qty 1

## 2020-11-20 MED ORDER — LORAZEPAM 1 MG PO TABS: 1.0000 mg | ORAL_TABLET | Freq: Every day | ORAL | 0 refills | Status: DC | PRN

## 2020-11-20 MED ORDER — NYSTATIN 100000 UNIT/ML MT SUSP: 500000.0000 [IU] | Freq: Four times a day (QID) | OROMUCOSAL | 0 refills | Status: AC

## 2020-11-20 MED ORDER — LIDOCAINE VISCOUS HCL 2 % MT SOLN
15.0000 mL | Freq: Once | OROMUCOSAL | Status: AC
  Administered 2020-11-20: 15 mL via ORAL
  Filled 2020-11-20: qty 15

## 2020-11-20 MED ORDER — LEVETIRACETAM 500 MG PO TABS: 500.0000 mg | ORAL_TABLET | Freq: Two times a day (BID) | ORAL | Status: DC

## 2020-11-20 MED ORDER — LORAZEPAM 2 MG/ML PO CONC: 0.5000 mg | Freq: Once | ORAL | Status: DC

## 2020-11-20 MED ORDER — LIDOCAINE VISCOUS HCL 2 % MT SOLN: 5.0000 mL | Freq: Four times a day (QID) | OROMUCOSAL | 0 refills | Status: DC | PRN

## 2020-11-20 MED ORDER — NYSTATIN 100000 UNIT/ML MT SUSP
5.0000 mL | Freq: Once | OROMUCOSAL | Status: AC
  Administered 2020-11-20: 500000 [IU] via ORAL
  Filled 2020-11-20: qty 5

## 2020-11-20 NOTE — ED Triage Notes (Signed)
Pt to er, pt states that he has a hx of throat cancer, states that he is here for a sore throat and has just finished chemo and radiation for throat cancer.  Pt's voice is hoarse, resps even and unlabored

## 2020-11-20 NOTE — Discharge Instructions (Signed)
I prescribed you some Ativan to help with this feeling of air hunger.  Do NOT take more than prescribed.   Taking too much of this medicine puts you at risk for an overdose, which can kill you.  If the medicine is not helping, stop taking it.  Most importantly, call your cancer doctor tomorrow.  They should know about your visit to the ER.  We also prescribed you some medicine to help with the yeast infection in your throat.   Take the full course as prescribed.

## 2020-11-20 NOTE — ED Provider Notes (Signed)
[facility] EMERGENCY DEPARTMENT Provider Note   CSN: [id number] Arrival date & time: 11/20/20  1841     History Chief Complaint  Patient presents with  . Sore Throat    [name] is a 45 y.o. male.  With a past medical history of alcohol abuse, COPD, history of seizures, gastroesophageal reflux with diagnosis of squamous cell carcinoma of the epiglottis who recently underwent 7 rounds of cisplatin and concurrent radiation therapy who is currently under observation by oncology presents emergency department with chief complaint of sore throat and shortness of breath.  Patient is a very very poor historian.  Is difficult to ascertain the specific reason he came to the emergency department.  He has chronic dry mouth and sore throat which has been ongoing.  He has a lot of pain with swallowing.  The patient states that he feels like he cannot breathe.  He is unable to tell me if this began suddenly.  He is on chronic oxygen supplementation and has not had any increase in his need for oxygen.  He denies chest pain.  He denies unilateral leg swelling.  He denies cough or fevers.  HPI     Past Medical History:  Diagnosis Date  . Back pain   . Bradycardia 08/17/2011  . Cancer ([place])   . COPD (chronic obstructive pulmonary disease) ([place])   . ETOH abuse   . GERD (gastroesophageal reflux disease)   . Macrocytosis 08/17/2011  . Pancreatitis   . Pneumonia   . Port-A-Cath in place 09/04/2020  . SAH (subarachnoid hemorrhage) ([place])   . Seizures ([place])    last one 6 mos ago    Patient Active Problem List   Diagnosis Date Noted  . Port-A-Cath in place 09/04/2020  . 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 Abdominal pain, epigastric 12/31/2017  . Gastric ulcer 08/12/2017  . ETOH abuse   . Atypical chest pain   . Hypercalcemia 10/08/2016  . Hypokalemia 10/08/2016  . Diarrhea 10/09/2015  . Pancreatitis, alcoholic, acute   . Alcohol-induced chronic pancreatitis ([place])   . Acute  pancreatitis   . Pancreatitis 12/26/2014  . Pancreatic pseudocyst 12/26/2014  . Hyponatremia 12/26/2014  . HTN (hypertension) 07/27/2013  . Abdominal pain 07/11/2012  . Macrocytosis 08/17/2011  . Marijuana abuse 08/17/2011  . Bradycardia 08/17/2011  . Alcohol withdrawal ([place]) 08/17/2011  . Elevated blood pressure 07/08/2011  . Left against medical advice 07/08/2011  . Acute alcoholic pancreatitis 0000000  . Alcohol abuse, continuous 07/07/2011  . Tobacco abuse 07/07/2011  . Seizure disorder ([place]) 07/07/2011  . Adynamic ileus ([place]) 07/07/2011  . Wheezing 07/07/2011  . COPD (chronic obstructive pulmonary disease) ([place]) 07/07/2011    Past Surgical History:  Procedure Laterality Date  . BACK SURGERY    . ESOPHAGOGASTRODUODENOSCOPY (EGD) WITH PROPOFOL N/A 08/14/2017   Procedure: ESOPHAGOGASTRODUODENOSCOPY (EGD) WITH PROPOFOL;  Surgeon: Daneil Dolin, MD;  Location: [facility];  Service: Endoscopy;  Laterality: N/A;  1:30pm  . ESOPHAGOGASTRODUODENOSCOPY (EGD) WITH PROPOFOL N/A 03/13/2018   Procedure: ESOPHAGOGASTRODUODENOSCOPY (EGD) WITH PROPOFOL;  Surgeon: Daneil Dolin, MD;  Location: [facility];  Service: Endoscopy;  Laterality: N/A;  2:30pm  . ESOPHAGOGASTRODUODENOSCOPY (EGD) WITH PROPOFOL N/A 08/22/2020   Procedure: ESOPHAGOGASTRODUODENOSCOPY (EGD) WITH PROPOFOL;  Surgeon: Aviva Signs, MD;  Location: [facility] ORS;  Service: General;  Laterality: N/A;  . PEG PLACEMENT N/A 08/22/2020   Procedure: PERCUTANEOUS ENDOSCOPIC GASTROSTOMY (PEG) PLACEMENT;  Surgeon: Aviva Signs, MD;  Location: [facility] ORS;  Service: General;  Laterality: N/A;  . PORTACATH PLACEMENT Left 08/22/2020   Procedure: INSERTION PORT-A-CATH;  Surgeon: Aviva Signs, MD;  Location: [facility] ORS;  Service: General;  Laterality: Left;  . SEPTOPLASTY         Family History  Problem Relation Age of Onset  . Coronary artery disease Mother        deceased age 47  . Seizures Father   . Alcohol abuse Father   . Colon  cancer Neg Hx     Social History   Tobacco Use  . Smoking status: Current Every Day Smoker    Packs/day: 1.00    Years: 20.00    Pack years: 20.00    Types: Cigarettes  . Smokeless tobacco: Never Used  Vaping Use  . Vaping Use: Never used  Substance Use Topics  . Alcohol use: Yes    Comment: couple of beers daily  . Drug use: Not Currently    Types: Marijuana    Home Medications Prior to Admission medications   Medication Sig Start Date End Date Taking? Authorizing Provider  acetaminophen (TYLENOL) 325 MG tablet Take 2 tablets (650 mg total) by mouth every 6 (six) hours as needed for mild pain (or Fever >/= 101). Patient not taking: Reported on 11/02/2020 10/19/20   Roxan Hockey, MD  albuterol (PROVENTIL) (2.5 MG/3ML) 0.083% nebulizer solution Take 2.5 mg by nebulization every 6 (six) hours as needed for wheezing or shortness of breath. Patient not taking: Reported on 11/02/2020    [provider]  albuterol (VENTOLIN HFA) 108 (90 Base) MCG/ACT inhaler Inhale 2 puffs into the lungs every 6 (six) hours as needed for wheezing or shortness of breath. Patient not taking: Reported on 11/02/2020 10/19/20   Roxan Hockey, MD  amLODipine (NORVASC) 10 MG tablet Take 10 mg by mouth daily.  06/22/20   [provider]  chlordiazePOXIDE (LIBRIUM) 25 MG capsule Take 1 capsule (25 mg total) by mouth every 6 (six) hours. 09/05/20   Derek Jack, MD  CISPLATIN IV Inject into the vein once a week. 09/05/20   [provider]  dexamethasone 0.5 MG/5ML elixir Take by mouth. 09/15/20   [provider]  diclofenac Sodium (VOLTAREN) 1 % GEL Apply 2 g topically daily as needed (pain).  07/28/20   [provider]  folic acid (FOLVITE) 1 MG tablet Take 1 tablet (1 mg total) by mouth daily. 10/20/20   Roxan Hockey, MD  gabapentin (NEURONTIN) 300 MG capsule Take 300 mg by mouth 3 (three) times daily. 07/21/20   [provider]  levETIRAcetam  (KEPPRA) 500 MG tablet Take 2 tablets (1,000 mg total) by mouth 2 (two) times daily. 10/19/20   Roxan Hockey, MD  lidocaine-prilocaine (EMLA) cream Apply small amount to port a cath site and cover with plastic wrap 1 hour prior to chemotherapy appointments 09/04/20   Derek Jack, MD  Multiple Vitamin (MULTIVITAMIN WITH MINERALS) TABS tablet Take 1 tablet by mouth daily. 10/20/20   Roxan Hockey, MD  ondansetron (ZOFRAN) 4 MG tablet Take 1 tablet (4 mg total) by mouth every 6 (six) hours as needed for nausea. Patient not taking: Reported on 11/02/2020 10/19/20   Roxan Hockey, MD  Oxycodone HCl 10 MG TABS Take 1 tablet (10 mg total) by mouth every 4 (four) hours as needed. 11/07/20   Derek Jack, MD  pantoprazole (PROTONIX) 40 MG tablet Take 1 tablet (40 mg total) by mouth daily. 10/19/20   Emokpae, Courage, MD  phenol (CHLORASEPTIC) 1.4 % LIQD Use as directed  1 spray in the mouth or throat as needed for throat irritation / pain. 10/19/20   Roxan Hockey, MD  prochlorperazine (COMPAZINE) 10 MG tablet Take 1 tablet (10 mg total) by mouth every 6 (six) hours as needed for nausea or vomiting. Patient not taking: Reported on 11/02/2020 08/25/20   Derek Jack, MD  SSD 1 % cream Apply topically in the morning and at bedtime. 10/04/20   [provider]  thiamine 100 MG tablet Take 1 tablet (100 mg total) by mouth daily. 10/20/20   Roxan Hockey, MD  topiramate (TOPAMAX) 25 MG tablet Take 1 tablet (25 mg total) by mouth See admin instructions. Take 1 tablet (25 mg) twice daily for 1 week, then increase to 2 tablets (50 mg) twice daily 10/19/20 10/19/21  Roxan Hockey, MD    Allergies    Patient has no known allergies.  Review of Systems   Review of Systems Ten systems reviewed and are negative for acute change, except as noted in the HPI.   Physical Exam Updated Vital Signs BP 122/71 (BP Location: Right Arm)   Pulse 86   Temp 97.9 F (36.6 C) (Oral)    Resp 18   Ht 5\' 7"  (1.702 m)   Wt 45.4 kg   SpO2 100%   BMI 15.66 kg/m   Physical Exam Vitals and nursing note reviewed.  Constitutional:      General: He is not in acute distress.    Appearance: He is well-nourished. He is cachectic. He is ill-appearing. He is not diaphoretic.     Interventions: Nasal cannula in place.  HENT:     Head: Normocephalic and atraumatic.     Mouth/Throat:     Pharynx: Oropharyngeal exudate and posterior oropharyngeal erythema present.     Comments: Patient with bright red erythema of the oropharynx.  There are multiple small singular white spots over the pharyngeal arch, uvula and posterior oropharynx consistent with candidal esophagitis and oral thrush Eyes:     General: No scleral icterus.    Conjunctiva/sclera: Conjunctivae normal.  Cardiovascular:     Rate and Rhythm: Normal rate and regular rhythm.     Heart sounds: Normal heart sounds.  Pulmonary:     Effort: Pulmonary effort is normal. No respiratory distress.     Breath sounds: Normal breath sounds.  Abdominal:     Palpations: Abdomen is soft.     Tenderness: There is no abdominal tenderness.  Musculoskeletal:        General: No edema.     Cervical back: Normal range of motion and neck supple.  Skin:    General: Skin is warm and dry.  Neurological:     Mental Status: He is alert.  Psychiatric:        Behavior: Behavior normal.     ED Results / Procedures / Treatments   Labs (all labs ordered are listed, but only abnormal results are displayed) Labs Reviewed  SARS CORONAVIRUS 2 BY RT PCR ([facility])  BASIC METABOLIC PANEL  BRAIN NATRIURETIC PEPTIDE  TROPONIN I (HIGH SENSITIVITY)    EKG None  Radiology No results found.  Procedures Procedures (including critical care time)  Medications Ordered in ED Medications  lidocaine (XYLOCAINE) 2 % viscous mouth solution 15 mL (has no administration in time range)  nystatin  (MYCOSTATIN) 100000 UNIT/ML suspension 500,000 Units (has no administration in time range)    ED Course  I have reviewed the triage vital signs and the nursing  notes.  Pertinent labs & imaging results that were available during my care of the patient were reviewed by me and considered in my medical decision making (see chart for details).  Clinical Course as of 11/20/20 2129  Sun Jan 23, 686  2463 45 year old male with a history of squamous cell carcinoma who is completed chemoradiation therapy presenting emergency department with sore throat and subjective shortness of breath.  The patient is a poor historian, but states that he has been feeling short of breath for "a long time".  He also reports a sore throat that is worsening.  He takes oxycodone chronically for pain, and is due for medication now.  He does state that he attempts to eat and drink by mouth, but also has a PEG tube for supplemental feeds.  He simply feels like is a difficult time breathing due to tightness in his throat.  On exam the patient peers comfortable.  He is satting 100% on room air.  He is not tachypneic.  His lungs are clear to auscultation.  He does have a hoarse voice.  He has thrush on oral exam.  He is afebrile.  It is reasonable to treat him for thrush, as well as given him an anxiolytic for the sensation of air hunger.  I will prescribe him a short course of PO ativan solution and advised he call his oncologist office tomorrow.  I carefully explained to him the risks of benzos with opioids, which he understands, but I do feel this medication may help alleviate his discomfort. [MT]  2119 Lower suspicion for ACS, PE, PNA, or COPD exacerbation with this presentation [MT]    Clinical Course User Index [MT] Trifan, Carola Rhine, MD   MDM Rules/Calculators/A&P                          Patient seen and shared visit with Dr. Dr. Langston Masker. Suspect that his vague shortness of breath symptoms are related to air hunger. Patient  is not requiring any more oxygen than normal he is afebrile. Physical examination the patient does have oral candidiasis and will discharge the patient with symptomatic treatment would like viscous lidocaine as well as nystatin suspension. The patient will be given a few days of benzodiazepine for his anxiety and air hunger. Final Clinical Impression(s) / ED Diagnoses Final diagnoses:  None    Rx / DC Orders ED Discharge Orders    None       Margarita Mail, PA-C 11/20/20 2131    Wyvonnia Dusky, MD 11/20/20 2308

## 2020-11-22 ENCOUNTER — Telehealth (HOSPITAL_COMMUNITY): Payer: Self-pay | Admitting: Surgery

## 2020-11-22 ENCOUNTER — Encounter (HOSPITAL_COMMUNITY): Payer: Self-pay | Admitting: Surgery

## 2020-11-22 NOTE — Telephone Encounter (Signed)
This pt had left a voicemail stating that he had been to the ER on 11/20/20 due to thrush and shortness of breath. The pt was given a prescription for nystatin and Ativan in the ER.  The pt was told to follow up with our office.  I notified Dr. Delton Coombes and let him know when the pt's next appointment was with our office.  Per Dr. Delton Coombes, no new orders right now, but let our office know if the thrush does not improve.  I left the pt a message on mychart with these instructions.

## 2020-12-02 ENCOUNTER — Ambulatory Visit (HOSPITAL_COMMUNITY): Payer: Medicaid Other

## 2020-12-02 ENCOUNTER — Other Ambulatory Visit (HOSPITAL_COMMUNITY): Payer: Self-pay | Admitting: *Deleted

## 2020-12-02 NOTE — Progress Notes (Signed)
Nutrition Follow-up:   Patient with squamous cell carcinoma of epiglottis.  Patient has completed chemotherapy and radiation 1/3.    Spoke with patient via phone for follow-up.  Patient reports that he has not used feeding tube for the last few days.  "I have been eating more foods even though it hurts."  States that he ate a whole ham sandwich and 1/2 of banana sandwich so far today.  No taste but still eating.  Does not like oral nutrition supplements.      Medications: reviewed  Labs: reviewed  Anthropometrics:   Weight 101 lb noted at [facility] follow-up on 12/01/2020  99 lb on 1/5 103 lb on 12/20   Estimated Energy Needs  Kcals: 1500-1700 Protein: 75-85 g Fluid: 1.5 L  NUTRITION DIAGNOSIS: Inadequate oral intake improving    INTERVENTION:  Encouraged patient to use 2-3 cartons of osmolite 1.5 between meals for added calories and protein Encouraged high calorie, high protein soft, moist foods for ease of swallowing. Food options discussed.  If patient decides not to use feeding tube still needs to flush with 50ml of water daily to keep tube patent until decision can be made to remove   MONITORING, EVALUATION, GOAL: weight trends, intake, tube feeding   NEXT VISIT: Mar 4th phone call  Nahum Sherrer B. Zenia Resides, [place], [place] Registered Dietitian [phone] (mobile)

## 2020-12-06 ENCOUNTER — Other Ambulatory Visit (HOSPITAL_COMMUNITY): Payer: Self-pay | Admitting: Hematology

## 2020-12-06 ENCOUNTER — Other Ambulatory Visit (HOSPITAL_COMMUNITY): Payer: Self-pay

## 2020-12-06 DIAGNOSIS — C321 Malignant neoplasm of supraglottis: Secondary | ICD-10-CM

## 2020-12-07 ENCOUNTER — Other Ambulatory Visit (HOSPITAL_COMMUNITY): Payer: Self-pay | Admitting: *Deleted

## 2020-12-07 ENCOUNTER — Other Ambulatory Visit (HOSPITAL_COMMUNITY): Payer: Self-pay

## 2020-12-07 DIAGNOSIS — C321 Malignant neoplasm of supraglottis: Secondary | ICD-10-CM

## 2020-12-08 ENCOUNTER — Other Ambulatory Visit (HOSPITAL_COMMUNITY): Payer: Self-pay | Admitting: *Deleted

## 2020-12-08 DIAGNOSIS — C321 Malignant neoplasm of supraglottis: Secondary | ICD-10-CM

## 2020-12-13 ENCOUNTER — Encounter (HOSPITAL_COMMUNITY): Payer: Self-pay | Admitting: Radiology

## 2020-12-14 ENCOUNTER — Ambulatory Visit (HOSPITAL_COMMUNITY)
Admission: RE | Admit: 2020-12-14 | Discharge: 2020-12-14 | Disposition: A | Discharge status: Home / Self Care | Payer: Medicaid Other | Source: Ambulatory Visit | Attending: Hematology | Admitting: Hematology

## 2020-12-14 ENCOUNTER — Encounter (HOSPITAL_COMMUNITY): Payer: Self-pay

## 2020-12-14 ENCOUNTER — Inpatient Hospital Stay (HOSPITAL_COMMUNITY): Payer: Medicaid Other | Attending: Hematology

## 2020-12-14 ENCOUNTER — Other Ambulatory Visit: Payer: Self-pay

## 2020-12-14 DIAGNOSIS — C01 Malignant neoplasm of base of tongue: Secondary | ICD-10-CM | POA: Diagnosis present

## 2020-12-14 DIAGNOSIS — C321 Malignant neoplasm of supraglottis: Secondary | ICD-10-CM | POA: Insufficient documentation

## 2020-12-14 DIAGNOSIS — Z79899 Other long term (current) drug therapy: Secondary | ICD-10-CM | POA: Insufficient documentation

## 2020-12-14 DIAGNOSIS — R634 Abnormal weight loss: Secondary | ICD-10-CM | POA: Insufficient documentation

## 2020-12-14 DIAGNOSIS — F17210 Nicotine dependence, cigarettes, uncomplicated: Secondary | ICD-10-CM | POA: Insufficient documentation

## 2020-12-14 DIAGNOSIS — E871 Hypo-osmolality and hyponatremia: Secondary | ICD-10-CM | POA: Diagnosis not present

## 2020-12-14 LAB — CBC WITH DIFFERENTIAL/PLATELET
Abs Immature Granulocytes: 0.02 10*3/uL (ref 0.00–0.07)
Basophils Absolute: 0 10*3/uL (ref 0.0–0.1)
Basophils Relative: 1 %
Eosinophils Absolute: 0.2 10*3/uL (ref 0.0–0.5)
Eosinophils Relative: 3 %
HCT: 33.7 % — ABNORMAL LOW (ref 39.0–52.0)
Hemoglobin: 11.5 g/dL — ABNORMAL LOW (ref 13.0–17.0)
Immature Granulocytes: 0 %
Lymphocytes Relative: 28 %
Lymphs Abs: 1.5 10*3/uL (ref 0.7–4.0)
MCH: 38 pg — ABNORMAL HIGH (ref 26.0–34.0)
MCHC: 34.1 g/dL (ref 30.0–36.0)
MCV: 111.2 fL — ABNORMAL HIGH (ref 80.0–100.0)
Monocytes Absolute: 0.9 10*3/uL (ref 0.1–1.0)
Monocytes Relative: 16 %
Neutro Abs: 2.9 10*3/uL (ref 1.7–7.7)
Neutrophils Relative %: 52 %
Platelets: 188 10*3/uL (ref 150–400)
RBC: 3.03 MIL/uL — ABNORMAL LOW (ref 4.22–5.81)
RDW: 13.2 % (ref 11.5–15.5)
WBC: 5.5 10*3/uL (ref 4.0–10.5)
nRBC: 0 % (ref 0.0–0.2)

## 2020-12-14 LAB — COMPREHENSIVE METABOLIC PANEL
ALT: 14 U/L (ref 0–44)
AST: 17 U/L (ref 15–41)
Albumin: 3.4 g/dL — ABNORMAL LOW (ref 3.5–5.0)
Alkaline Phosphatase: 101 U/L (ref 38–126)
Anion gap: 9 (ref 5–15)
BUN: 6 mg/dL (ref 6–20)
CO2: 27 mmol/L (ref 22–32)
Calcium: 8.8 mg/dL — ABNORMAL LOW (ref 8.9–10.3)
Chloride: 91 mmol/L — ABNORMAL LOW (ref 98–111)
Creatinine, Ser: 0.3 mg/dL — ABNORMAL LOW (ref 0.61–1.24)
GFR, Estimated: >60 mL/min (ref >60)
Glucose, Bld: 102 mg/dL — ABNORMAL HIGH (ref 70–99)
Potassium: 3.3 mmol/L — ABNORMAL LOW (ref 3.5–5.1)
Sodium: 127 mmol/L — ABNORMAL LOW (ref 135–145)
Total Bilirubin: 0.5 mg/dL (ref 0.3–1.2)
Total Protein: 7.8 g/dL (ref 6.5–8.1)

## 2020-12-14 LAB — MAGNESIUM: Magnesium: 1.9 mg/dL (ref 1.7–2.4)

## 2020-12-14 LAB — TSH: TSH: 0.496 u[IU]/mL (ref 0.350–4.500)

## 2020-12-14 MED ORDER — HEPARIN SOD (PORK) LOCK FLUSH 100 UNIT/ML IV SOLN
500.0000 [IU] | Freq: Once | INTRAVENOUS | Status: DC
  Administered 2020-12-14: 500 [IU] via INTRAVENOUS

## 2020-12-14 MED ORDER — SODIUM CHLORIDE 0.9% FLUSH: 10.0000 mL | INTRAVENOUS | Status: DC | PRN

## 2020-12-14 MED ORDER — IOHEXOL 300 MG/ML  SOLN
75.0000 mL | Freq: Once | INTRAMUSCULAR | Status: AC | PRN
  Administered 2020-12-14: 75 mL via INTRAVENOUS

## 2020-12-14 NOTE — Progress Notes (Signed)
Warnell Bureau Deroy presented for Portacath access and flush.  Portacath located left chest wall accessed with  PH 20 needle.  Good blood return present - labs drawn from site.   Portacath flushed with 6ml NS.  Procedure tolerated well and without incident.  Left port accessed for CT soft tissue neck with contrast scheduled at 1500 today.

## 2020-12-15 ENCOUNTER — Inpatient Hospital Stay (HOSPITAL_BASED_OUTPATIENT_CLINIC_OR_DEPARTMENT_OTHER): Payer: Medicaid Other | Admitting: Hematology

## 2020-12-15 ENCOUNTER — Encounter (HOSPITAL_COMMUNITY): Payer: Self-pay | Admitting: Lab

## 2020-12-15 VITALS — BP 139/69 | HR 89 | Temp 96.9°F | Resp 16 | Wt 99.7 lb

## 2020-12-15 DIAGNOSIS — C321 Malignant neoplasm of supraglottis: Secondary | ICD-10-CM | POA: Diagnosis not present

## 2020-12-15 DIAGNOSIS — C01 Malignant neoplasm of base of tongue: Secondary | ICD-10-CM | POA: Diagnosis not present

## 2020-12-15 MED ORDER — OXYCODONE HCL 10 MG PO TABS: 10.0000 mg | ORAL_TABLET | Freq: Two times a day (BID) | ORAL | 0 refills | Status: AC | PRN

## 2020-12-15 NOTE — Progress Notes (Unsigned)
Appt made for Dr Benjamine Mola.  appt 2/21 @250 .  Pt aware of appt date and time

## 2020-12-15 NOTE — Progress Notes (Signed)
[name], [place] [address]   CLINIC:  Medical Oncology/Hematology  PCP:  Jani Gravel, MD [address] [place] / [place] [address] [phone]   REASON FOR VISIT:  Follow-up for epiglottic squamous cell carcinoma  PRIOR THERAPY: Concurrent chemoradiation with cisplatin x 7 cycles through 10/17/2020 and radiation through 10/31/2020  NGS Results: Not done  CURRENT THERAPY: Surveillance  BRIEF ONCOLOGIC HISTORY:  Oncology History  Squamous cell carcinoma of epiglottis ([place])  08/22/2020 Initial Diagnosis   Squamous cell carcinoma of epiglottis ([place])   09/05/2020 -  Chemotherapy   The patient had palonosetron (ALOXI) injection 0.25 mg, 0.25 mg, Intravenous,  Once, 7 of 7 cycles Administration: 0.25 mg (09/05/2020), 0.25 mg (09/12/2020), 0.25 mg (09/19/2020), 0.25 mg (09/26/2020), 0.25 mg (10/03/2020), 0.25 mg (10/10/2020), 0.25 mg (10/17/2020) CISplatin (PLATINOL) 60 mg in sodium chloride 0.9 % 250 mL chemo infusion, 40 mg/m2 = 60 mg, Intravenous,  Once, 7 of 7 cycles Administration: 60 mg (09/05/2020), 60 mg (09/12/2020), 60 mg (09/19/2020), 60 mg (09/26/2020), 60 mg (10/03/2020), 60 mg (10/10/2020), 60 mg (10/17/2020) fosaprepitant (EMEND) 150 mg in sodium chloride 0.9 % 145 mL IVPB, 150 mg, Intravenous,  Once, 7 of 7 cycles Administration: 150 mg (09/05/2020), 150 mg (09/12/2020), 150 mg (09/19/2020), 150 mg (09/26/2020), 150 mg (10/03/2020), 150 mg (10/10/2020), 150 mg (10/17/2020)  for chemotherapy treatment.      CANCER STAGING: Cancer Staging No matching staging information was found for the patient.  INTERVAL HISTORY:  [name], a 45 y.o. male, returns for routine follow-up of his epiglottic squamous cell carcinoma. [name] was last seen on 11/02/2020.   Today he is accompanied by his friend and he reports feeling fair. He is eating more by mouth and is putting in 2 cans of Ensure a day through his PEG tube, but he  continues losing weight. He continues having throat pain and is having trouble swallowing; he took lidocaine solution for the throat pain which is not helping and is taking oxycodone 10 mg six times daily for pain control. He is drinking water and [place] but denies drinking beer.  He continues smoking 1 PPD.   REVIEW OF SYSTEMS:  Review of Systems  Constitutional: Positive for appetite change (50%) and fatigue (25%).  HENT:   Positive for sore throat and trouble swallowing.   Respiratory: Positive for cough and wheezing.   All other systems reviewed and are negative.   PAST MEDICAL/SURGICAL HISTORY:  Past Medical History:  Diagnosis Date  . Back pain   . Bradycardia 08/17/2011  . Cancer ([place])   . COPD (chronic obstructive pulmonary disease) ([place])   . ETOH abuse   . GERD (gastroesophageal reflux disease)   . Macrocytosis 08/17/2011  . Pancreatitis   . Pneumonia   . Port-A-Cath in place 09/04/2020  . SAH (subarachnoid hemorrhage) ([place])   . Seizures ([place])    last one 6 mos ago   Past Surgical History:  Procedure Laterality Date  . BACK SURGERY    . ESOPHAGOGASTRODUODENOSCOPY (EGD) WITH PROPOFOL N/A 08/14/2017   Procedure: ESOPHAGOGASTRODUODENOSCOPY (EGD) WITH PROPOFOL;  Surgeon: Daneil Dolin, MD;  Location[facility];  Service: Endoscopy;  Laterality: N/A;  1:30pm  . ESOPHAGOGASTRODUODENOSCOPY (EGD) WITH PROPOFOL N/A 03/13/2018   Procedure: ESOPHAGOGASTRODUODENOSCOPY (EGD) WITH PROPOFOL;  Surgeon: Daneil Dolin, MD;  Location: [facility];  Service: Endoscopy;  Laterality: N/A;  2:30pm  . ESOPHAGOGASTRODUODENOSCOPY (EGD) WITH PROPOFOL N/A 08/22/2020   Procedure: ESOPHAGOGASTRODUODENOSCOPY (  EGD) WITH PROPOFOL;  Surgeon: Aviva Signs, MD;  Location: [facility] ORS;  Service: General;  Laterality: N/A;  . PEG PLACEMENT N/A 08/22/2020   Procedure: PERCUTANEOUS ENDOSCOPIC GASTROSTOMY (PEG) PLACEMENT;  Surgeon: Aviva Signs, MD;  Location: [facility];  Service: General;  Laterality:  N/A;  . PORTACATH PLACEMENT Left 08/22/2020   Procedure: INSERTION PORT-A-CATH;  Surgeon: Aviva Signs, MD;  Location: [facility];  Service: General;  Laterality: Left;  . SEPTOPLASTY      SOCIAL HISTORY:  Social History   Socioeconomic History  . Marital status: Single    Spouse name: Not on file  . Number of children: Not on file  . Years of education: Not on file  . Highest education level: Not on file  Occupational History  . Not on file  Tobacco Use  . Smoking status: Current Every Day Smoker    Packs/day: 1.00    Years: 20.00    Pack years: 20.00    Types: Cigarettes  . Smokeless tobacco: Never Used  Vaping Use  . Vaping Use: Never used  Substance and Sexual Activity  . Alcohol use: Yes    Comment: couple of beers daily  . Drug use: Not Currently    Types: Marijuana  . Sexual activity: Not Currently  Other Topics Concern  . Not on file  Social History Narrative  . Not on file   Social Determinants of Health   Financial Resource Strain: Low Risk   . Difficulty of Paying Living Expenses: Not very hard  Food Insecurity: No Food Insecurity  . Worried About Charity fundraiser in the Last Year: Never true  . Ran Out of Food in the Last Year: Never true  Transportation Needs: No Transportation Needs  . Lack of Transportation (Medical): No  . Lack of Transportation (Non-Medical): No  Physical Activity: Sufficiently Active  . Days of Exercise per Week: 7 days  . Minutes of Exercise per Session: 60 min  Stress: Stress Concern Present  . Feeling of Stress : To some extent  Social Connections: Moderately Isolated  . Frequency of Communication with Friends and Family: More than three times a week  . Frequency of Social Gatherings with Friends and Family: More than three times a week  . Attends Religious Services: Never  . Active Member of Clubs or Organizations: No  . Attends Archivist Meetings: Never  . Marital Status: Living with partner  Intimate  Partner Violence: Not At Risk  . Fear of Current or Ex-Partner: No  . Emotionally Abused: No  . Physically Abused: No  . Sexually Abused: No    FAMILY HISTORY:  Family History  Problem Relation Age of Onset  . Coronary artery disease Mother        deceased age 104  . Seizures Father   . Alcohol abuse Father   . Colon cancer Neg Hx     CURRENT MEDICATIONS:  Current Outpatient Medications  Medication Sig Dispense Refill  . acetaminophen (TYLENOL) 325 MG tablet Take 2 tablets (650 mg total) by mouth every 6 (six) hours as needed for mild pain (or Fever >/= 101). 12 tablet 0  . albuterol (PROVENTIL) (2.5 MG/3ML) 0.083% nebulizer solution Take 2.5 mg by nebulization every 6 (six) hours as needed for wheezing or shortness of breath.    Marland Kitchen albuterol (VENTOLIN HFA) 108 (90 Base) MCG/ACT inhaler Inhale 2 puffs into the lungs every 6 (six) hours as needed for wheezing or shortness of breath. 18 g 1  .  amLODipine (NORVASC) 10 MG tablet Take 10 mg by mouth daily.     . carbamazepine (TEGRETOL) 200 MG tablet Take 400 mg by mouth 2 (two) times daily.    . chlordiazePOXIDE (LIBRIUM) 25 MG capsule Take 1 capsule (25 mg total) by mouth every 6 (six) hours. 30 capsule 0  . CISPLATIN IV Inject into the vein once a week.    Marland Kitchen dexamethasone 0.5 MG/5ML elixir Take by mouth.    . diclofenac Sodium (VOLTAREN) 1 % GEL Apply 2 g topically daily as needed (pain).     . folic acid (FOLVITE) 1 MG tablet Take 1 tablet (1 mg total) by mouth daily. 30 tablet 2  . gabapentin (NEURONTIN) 300 MG capsule Take 300 mg by mouth 3 (three) times daily.    Marland Kitchen levETIRAcetam (KEPPRA) 500 MG tablet Take 2 tablets (1,000 mg total) by mouth 2 (two) times daily. 60 tablet 5  . lidocaine (XYLOCAINE) 2 % solution Use as directed 5 mLs in the mouth or throat 4 (four) times daily as needed for mouth pain. Swish and swallow 250 mL 0  . lidocaine-prilocaine (EMLA) cream Apply small amount to port a cath site and cover with plastic wrap 1  hour prior to chemotherapy appointments 30 g 3  . LORazepam (ATIVAN) 1 MG tablet Take 1 tablet (1 mg total) by mouth daily as needed for up to 10 doses for anxiety (For air hunger). 10 tablet 0  . magic mouthwash SOLN Take by mouth.    . Multiple Vitamin (MULTIVITAMIN WITH MINERALS) TABS tablet Take 1 tablet by mouth daily. 90 tablet 1  . ondansetron (ZOFRAN) 4 MG tablet Take 1 tablet (4 mg total) by mouth every 6 (six) hours as needed for nausea. 20 tablet 0  . Oxycodone HCl 10 MG TABS Take 1 tablet (10 mg total) by mouth every 4 (four) hours as needed. 180 tablet 0  . pantoprazole (PROTONIX) 40 MG tablet Take 1 tablet (40 mg total) by mouth daily. 30 tablet 5  . phenol (CHLORASEPTIC) 1.4 % LIQD Use as directed 1 spray in the mouth or throat as needed for throat irritation / pain. 118 mL 0  . prochlorperazine (COMPAZINE) 10 MG tablet Take 1 tablet (10 mg total) by mouth every 6 (six) hours as needed for nausea or vomiting. 30 tablet 2  . SSD 1 % cream Apply topically in the morning and at bedtime.    . thiamine 100 MG tablet Take 1 tablet (100 mg total) by mouth daily. 30 tablet 2  . topiramate (TOPAMAX) 25 MG tablet Take 1 tablet (25 mg total) by mouth See admin instructions. Take 1 tablet (25 mg) twice daily for 1 week, then increase to 2 tablets (50 mg) twice daily 120 tablet 2   No current facility-administered medications for this visit.    ALLERGIES:  No Known Allergies  PHYSICAL EXAM:  Performance status (ECOG): 1 - Symptomatic but completely ambulatory  Vitals:   12/15/20 1014  BP: 139/69  Pulse: 89  Resp: 16  Temp: (!) 96.9 F (36.1 C)  SpO2: 100%   Wt Readings from Last 3 Encounters:  12/15/20 99 lb 11.2 oz (45.2 kg)  12/14/20 94 lb 11.2 oz (43 kg)  11/20/20 100 lb (45.4 kg)   Physical Exam Vitals reviewed.  Constitutional:      Appearance: Normal appearance.  HENT:     Mouth/Throat:     Lips: No lesions.     Mouth: Mucous membranes are dry. No oral  lesions.      Tongue: No lesions.  Cardiovascular:     Rate and Rhythm: Normal rate and regular rhythm.     Pulses: Normal pulses.     Heart sounds: Normal heart sounds.  Pulmonary:     Effort: Pulmonary effort is normal.     Breath sounds: Normal breath sounds.  Abdominal:     Comments: PEG tube  Lymphadenopathy:     Cervical: Cervical adenopathy present.     Right cervical: Superficial cervical adenopathy (1 cm LN at level 2) present.  Neurological:     General: No focal deficit present.     Mental Status: He is alert and oriented to person, place, and time.  Psychiatric:        Mood and Affect: Mood normal.        Behavior: Behavior normal.      LABORATORY DATA:  I have reviewed the labs as listed.  CBC Latest Ref Rng & Units 12/14/2020 11/01/2020 10/18/2020  WBC 4.0 - 10.5 K/uL 5.5 3.6(L) 3.4(L)  Hemoglobin 13.0 - 17.0 g/dL 11.5(L) 10.2(L) 9.9(L)  Hematocrit 39.0 - 52.0 % 33.7(L) 28.6(L) 28.9(L)  Platelets 150 - 400 K/uL 188 139(L) 151   CMP Latest Ref Rng & Units 12/14/2020 11/01/2020 10/19/2020  Glucose 70 - 99 mg/dL 102(H) 110(H) 80  BUN 6 - 20 mg/dL 6 <5(L) 8  Creatinine 0.61 - 1.24 mg/dL 0.30(L) <0.30(L) <0.30(L)  Sodium 135 - 145 mmol/L 127(L) 123(L) 125(L)  Potassium 3.5 - 5.1 mmol/L 3.3(L) 3.4(L) 5.1  Chloride 98 - 111 mmol/L 91(L) 82(L) 90(L)  CO2 22 - 32 mmol/L 27 30 28   Calcium 8.9 - 10.3 mg/dL 8.8(L) 9.4 8.7(L)  Total Protein 6.5 - 8.1 g/dL 7.8 7.6 -  Total Bilirubin 0.3 - 1.2 mg/dL 0.5 0.5 -  Alkaline Phos 38 - 126 U/L 101 110 -  AST 15 - 41 U/L 17 17 -  ALT 0 - 44 U/L 14 14 -    DIAGNOSTIC IMAGING:  I have independently reviewed the scans and discussed with the patient. CT SOFT TISSUE NECK W CONTRAST  Result Date: 12/15/2020 CLINICAL DATA:  45 year old male 1st post treatment neck CT 4 epiglottic carcinoma. EXAM: CT NECK WITH CONTRAST TECHNIQUE: Multidetector CT imaging of the neck was performed using the standard protocol following the bolus administration of  intravenous contrast. CONTRAST:  58mL OMNIPAQUE IOHEXOL 300 MG/ML  SOLN COMPARISON:  PET-CT 08/05/2020. [facility] neck CT 08/31/2020. CTA chest 10/26/2012. FINDINGS: Pharynx and larynx: Diffuse pharyngeal mucosal space thickening compatible with sequelae of XRT. At the level of the epiglottis there is significant residual asymmetry including 7-8 mm nodularity of the right AE fold (series 2, image 53) and asymmetric low-density soft tissue along the posterior wall of the right hypopharynx (series 2, image 57). Compared to 08/31/2020 there has been substantial decreased soft tissue mass along the midline and left epiglottis. Soft tissue mass has regressed along the left AE fold, and the true cords now appear more symmetric. Diffuse superficial enhancement is compatible with a degree of mucositis. Parapharyngeal and retropharyngeal spaces appear within normal limits. p Salivary glands: Diffuse glandular hyperenhancement compatible with XRT. Negative sublingual space. Thyroid: Negative. Lymph nodes: Regressed dominant right level 2 malignant nodes, with obscured soft tissue planes following XRT. Residual soft tissue in the region of the dominant formally 2 cm node is now 10 mm on series 2, image 62. Smaller malignant left level 2A lymph node has regressed with diminutive 4-5 mm enhancing nodal  tissue on series 2, image 49. Other bilateral cervical nodes are enhancing now but remain normal by size criteria. Vascular: Advanced calcified atherosclerosis in the neck and at the skull base. Hemodynamically significant cervical ICA stenosis at the distal bulbs greater on the left (series 2, image 47 and numerically estimated at 70 % with respect to the distal vessel. Dominant left vertebral artery. Both internal jugular veins appear chronically occluded/atrophic. Limited intracranial: Small chronic right cerebellar infarct appears stable. Visualized orbits: Negative. Mastoids and visualized paranasal sinuses:  Chronic bilateral sinusitis, most pronounced in the maxillary sinuses and not significantly changed since last year. Visible tympanic cavities and mastoids remain clear. Skeleton: Absent dentition. Osteopenia. Chronic deformity left mandible condyle. Mild for age cervical spine degeneration. Stable chronic T4 and T5 superior endplate compression since 10/26/2012. Congenital incomplete ossification of the posterior C1 ring. No acute or suspicious osseous lesion identified. Upper chest: Severe upper lung disease, especially left upper lobe bronchiectasis, scarring, and architectural distortion. Superimposed paraseptal emphysema. Visible lung parenchyma appears stable since 08/31/2020. Calcified aortic atherosclerosis. Trace retained secretions in the trachea. Left chest Port-A-Cath. No superior mediastinal lymphadenopathy. Other: None. IMPRESSION: 1. NI-RADS category 2B, recommend short interval Neck CT follow-up in 3 months or repeat PET-CT. Sequelae of XRT in the neck with: - regressed but not resolved bilateral epiglottic and hypopharynx mass-like soft tissue. - regressed malignant level II lymph nodes, 10 mm residual on the right. 2. Extensive calcified atherosclerosis, including evidence of hemodynamically significant ICA stenosis greater on the left (estimated at 70%). Aortic Atherosclerosis (ICD10-I70.0). Follow-up Carotid Doppler Ultrasound may be valuable. 3. Severe upper lobe lung disease with Bronchiectasis, Emphysema (ICD10-J43.9, architectural distortion, and architectural distortion. Electronically Signed   By: Genevie Ann M.D.   On: 12/15/2020 07:56   DG Chest Port 1 View  Result Date: 11/20/2020 CLINICAL DATA:  Short of breath, chest pain, throat pain EXAM: PORTABLE CHEST 1 VIEW COMPARISON:  08/22/2020 FINDINGS: Single frontal view of the chest demonstrates stable left chest wall port. Cardiac silhouette is unremarkable. Background scarring and fibrosis again noted unchanged. No acute airspace disease,  effusion, or pneumothorax. IMPRESSION: 1. Stable scarring and fibrosis.  No acute intrathoracic process. Electronically Signed   By: Randa Ngo M.D.   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 -Ultrasound of the neck on 04/28/2020 with 2.8 x 1.7 cm mass on the right neck and several relatively small hypoechoic lymph nodes in the left side. -Weight loss of 22 pounds in the last 3 months. No fevers or night sweats. -Positive for dysphagia. -PET CT scan on 08/05/2020 shows epiglottis is thickened and irregular with hypermetabolic. Left level 2 lymph node positive. Right-sided level 2 nodal metastasis measuring 2.7 x 2.2 cm. Bilateral areas of peribronchovascular reticulonodular opacities with hypermetabolism favoring infection. No extra cervical metastatic disease. Thoracic nodes with low-level of hypermetabolism favored to be reactive. -Fiberoptic exam by Dr. Benjamine Mola on 08/02/2020 showed tongue base mass noted that involved the superior aspect of the epiglottis. Arytenoid mucosa was edematous with slight erythema. True vocal cords were pale yellow and edematous but without mass or lesion. -FNA of the right neck lymph node on 08/02/2020 consistent with squamous cell carcinoma. -Chemoradiation therapy with weekly cisplatin from 09/05/2020 through week 7 of chemotherapy on 10/17/2020 and XRT ended on 10/31/2020.  2. Social/family history: -Worked in the roofing, now on disability. -1 pack/day for 30  years, active smoker. -3 beers per day for the last 10 years.   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 -Lymph node on the right neck level 2 region has improved in size. -I have reviewed his CT scan of the neck results.  It shows some inflammation but decrease in overall size of the base of the tongue lesion.   Lymph nodes have also decreased in size.  Largest lymph node in the right neck measures 10 mm. -TSH today 0.496. -Recommend follow-up with Dr. Benjamine Mola for direct visualization. -We will schedule him for PET scan in 4 weeks and return to clinic.  We will also check TSH.  2. Weight loss: -He is eating soft foods.  His weight is stable. -He has cut back on Osmolite 1.5 to 1 to 2 cans/day via PEG tube. -I have told him to increase to 3 cans/day.  3. Right neck pain: -He reports pain in the back of the throat when he swallows. -I have given a prescription for oxycodone 10 mg twice daily as needed #60.  Now that it has been over a month since completion of radiation, his pain should start improving.  4. Ongoing EtOH use: -He reports that he has not been drinking beer at all in the last 10 days.  Prior to that he drank 1 or 2, 12 ounce bottles. -I have counseled him to quit drinking completely.  He was also told to quit smoking completely.  5. 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  It is stable.   Orders placed this encounter:  Orders Placed This Encounter  Procedures  . CBC with Differential/Platelet  . Comprehensive metabolic panel  . TSH     Derek Jack, MD [place] [phone]   I, Milinda Antis, am acting as a scribe for Dr. Sanda Linger.  I, Derek Jack MD, have reviewed the above documentation for accuracy and completeness, and I agree with the above.

## 2020-12-15 NOTE — Patient Instructions (Signed)
[place] at [facility] Discharge Instructions  You were seen today by Dr. Delton Coombes. He went over your recent results and scans. Stop smoking and drinking alcoholic beverages to allow your throat to heal up. Put in 3 cans of Ensure through your PEG tube daily to maintain your weight and energy levels. You will be referred to Dr. Benjamine Mola (ENT specialist) to look at the cancer. You will also be scheduled to have a PET scan before your next visit. Dr. Delton Coombes will see you back in 4 weeks for labs and follow up.   Thank you for choosing [place] at [facility] to provide your oncology and hematology care.  To afford each patient quality time with our provider, please arrive at least 15 minutes before your scheduled appointment time.   If you have a lab appointment with the [facility] please come in thru the Main Entrance and check in at the main information desk  You need to re-schedule your appointment should you arrive 10 or more minutes late.  We strive to give you quality time with our providers, and arriving late affects you and other patients whose appointments are after yours.  Also, if you no show three or more times for appointments you may be dismissed from the clinic at the providers discretion.     Again, thank you for choosing [facility].  Our hope is that these requests will decrease the amount of time that you wait before being seen by our physicians.       _____________________________________________________________  Should you have questions after your visit to [facility], please contact our office at [phone] between the hours of 8:00 a.m. and 4:30 p.m.  Voicemails left after 4:00 p.m. will not be returned until the following business day.  For prescription refill requests, have your pharmacy contact our office and allow 72 hours.    Cancer Center Support Programs:   > Cancer Support Group  2nd  Tuesday of the month 1pm-2pm, Journey Room

## 2020-12-21 ENCOUNTER — Ambulatory Visit (HOSPITAL_COMMUNITY): Payer: Medicaid Other | Admitting: Hematology

## 2020-12-22 ENCOUNTER — Encounter (HOSPITAL_COMMUNITY): Payer: Self-pay | Admitting: *Deleted

## 2020-12-22 ENCOUNTER — Other Ambulatory Visit (HOSPITAL_COMMUNITY): Payer: Self-pay | Admitting: *Deleted

## 2020-12-22 NOTE — Progress Notes (Signed)
Patient called clinic today stating that he was out of his medications prescribed by Dr. Delton Coombes last week. He reports that his physician at [facility] is willing to take over his pain management.  Per Dr. Delton Coombes, he will no longer prescribe any narcotics for this patient.  Patient is aware.

## 2020-12-30 ENCOUNTER — Encounter (HOSPITAL_COMMUNITY): Payer: Medicaid Other

## 2020-12-30 ENCOUNTER — Telehealth (HOSPITAL_COMMUNITY): Payer: Self-pay

## 2020-12-30 NOTE — Telephone Encounter (Signed)
Nutrition  Called patient for nutrition follow-up.  No answer. Left message with call back number  Leeandra Ellerson B. Janely Gullickson, RD, LDN Registered Dietitian [phone] (mobile)  

## 2021-01-09 ENCOUNTER — Inpatient Hospital Stay (HOSPITAL_COMMUNITY): Payer: Medicaid Other | Attending: Hematology

## 2021-01-09 ENCOUNTER — Ambulatory Visit (HOSPITAL_COMMUNITY)
Admission: RE | Admit: 2021-01-09 | Discharge: 2021-01-09 | Disposition: A | Discharge status: Home / Self Care | Payer: Medicaid Other | Source: Ambulatory Visit | Attending: Hematology | Admitting: Hematology

## 2021-01-09 ENCOUNTER — Other Ambulatory Visit: Payer: Self-pay

## 2021-01-09 DIAGNOSIS — E871 Hypo-osmolality and hyponatremia: Secondary | ICD-10-CM | POA: Insufficient documentation

## 2021-01-09 DIAGNOSIS — C770 Secondary and unspecified malignant neoplasm of lymph nodes of head, face and neck: Secondary | ICD-10-CM | POA: Insufficient documentation

## 2021-01-09 DIAGNOSIS — R070 Pain in throat: Secondary | ICD-10-CM | POA: Insufficient documentation

## 2021-01-09 DIAGNOSIS — M542 Cervicalgia: Secondary | ICD-10-CM | POA: Insufficient documentation

## 2021-01-09 DIAGNOSIS — M549 Dorsalgia, unspecified: Secondary | ICD-10-CM | POA: Insufficient documentation

## 2021-01-09 DIAGNOSIS — F17210 Nicotine dependence, cigarettes, uncomplicated: Secondary | ICD-10-CM | POA: Insufficient documentation

## 2021-01-09 DIAGNOSIS — C321 Malignant neoplasm of supraglottis: Secondary | ICD-10-CM | POA: Insufficient documentation

## 2021-01-09 MED ORDER — FLUDEOXYGLUCOSE F - 18 (FDG) INJECTION
6.4400 | Freq: Once | INTRAVENOUS | Status: AC | PRN
  Administered 2021-01-09: 6.44 via INTRAVENOUS

## 2021-01-12 ENCOUNTER — Other Ambulatory Visit (HOSPITAL_COMMUNITY): Payer: Self-pay

## 2021-01-12 ENCOUNTER — Inpatient Hospital Stay (HOSPITAL_BASED_OUTPATIENT_CLINIC_OR_DEPARTMENT_OTHER): Payer: Medicaid Other | Admitting: Hematology

## 2021-01-12 ENCOUNTER — Other Ambulatory Visit: Payer: Self-pay

## 2021-01-12 VITALS — BP 134/73 | HR 78 | Temp 97.7°F | Resp 17 | Wt 95.4 lb

## 2021-01-12 DIAGNOSIS — F17210 Nicotine dependence, cigarettes, uncomplicated: Secondary | ICD-10-CM | POA: Diagnosis not present

## 2021-01-12 DIAGNOSIS — C321 Malignant neoplasm of supraglottis: Secondary | ICD-10-CM | POA: Diagnosis present

## 2021-01-12 DIAGNOSIS — M549 Dorsalgia, unspecified: Secondary | ICD-10-CM | POA: Diagnosis not present

## 2021-01-12 DIAGNOSIS — E871 Hypo-osmolality and hyponatremia: Secondary | ICD-10-CM | POA: Diagnosis not present

## 2021-01-12 DIAGNOSIS — C770 Secondary and unspecified malignant neoplasm of lymph nodes of head, face and neck: Secondary | ICD-10-CM | POA: Diagnosis not present

## 2021-01-12 DIAGNOSIS — M542 Cervicalgia: Secondary | ICD-10-CM | POA: Diagnosis not present

## 2021-01-12 DIAGNOSIS — R070 Pain in throat: Secondary | ICD-10-CM | POA: Diagnosis not present

## 2021-01-12 NOTE — Progress Notes (Signed)
[name], [name] [id number]   CLINIC:  Medical Oncology/Hematology  PCP:  Jani Gravel, MD 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[place])   09/05/2020 -  Chemotherapy   The patient had palonosetron (ALOXI) injection 0.25 mg, 0.25 mg, Intravenous,  Once, 7 of 7 cycles Administration: 0.25 mg (09/05/2020), 0.25 mg (09/12/2020), 0.25 mg (09/19/2020), 0.25 mg (09/26/2020), 0.25 mg (10/03/2020), 0.25 mg (10/10/2020), 0.25 mg (10/17/2020) CISplatin (PLATINOL) 60 mg in sodium chloride 0.9 % 250 mL chemo infusion, 40 mg/m2 = 60 mg, Intravenous,  Once, 7 of 7 cycles Administration: 60 mg (09/05/2020), 60 mg (09/12/2020), 60 mg (09/19/2020), 60 mg (09/26/2020), 60 mg (10/03/2020), 60 mg (10/10/2020), 60 mg (10/17/2020) fosaprepitant (EMEND) 150 mg in sodium chloride 0.9 % 145 mL IVPB, 150 mg, Intravenous,  Once, 7 of 7 cycles Administration: 150 mg (09/05/2020), 150 mg (09/12/2020), 150 mg (09/19/2020), 150 mg (09/26/2020), 150 mg (10/03/2020), 150 mg (10/10/2020), 150 mg (10/17/2020)  for chemotherapy treatment.      CANCER STAGING: Cancer Staging No matching staging information was found for the patient.  INTERVAL HISTORY:  [name], a 45 y.o. male, returns for routine follow-up of his epiglottic squamous cell carcinoma. [name] was last seen on 12/15/2020.   Today he is accompanied by his sister and he reports feeling fair. He has not seen Dr. Benjamine Mola since he is not able to get to [facility]. He complains of having  tightness in his left neck and is having trouble swallowing food and liquids. He takes oxycodone which helps his back pain, but it is not helping his throat pain. He put in 1 can of Osmolite on 03/16 but has not been doing it consistently.   REVIEW OF SYSTEMS:  Review of Systems  Constitutional: Positive for appetite change (25%) and fatigue (25%).  HENT:   Positive for sore throat (8/10 throat pain) and trouble swallowing (solids & liquids).        Nasal congestion  Respiratory: Positive for cough.   All other systems reviewed and are negative.   PAST MEDICAL/SURGICAL HISTORY:  Past Medical History:  Diagnosis Date  . Back pain   . Bradycardia 08/17/2011  . Cancer ([place])   . COPD (chronic obstructive pulmonary disease) ([place])   . ETOH abuse   . GERD (gastroesophageal reflux disease)   . Macrocytosis 08/17/2011  . Pancreatitis   . Pneumonia   . Port-A-Cath in place 09/04/2020  . SAH (subarachnoid hemorrhage) ([place])   . Seizures ([place])    last one 6 mos ago   Past Surgical History:  Procedure Laterality Date  . BACK SURGERY    . ESOPHAGOGASTRODUODENOSCOPY (EGD) WITH PROPOFOL N/A 08/14/2017   Procedure: ESOPHAGOGASTRODUODENOSCOPY (EGD) WITH PROPOFOL;  Surgeon: Daneil Dolin, MD;  Location: [facility];  Service: Endoscopy;  Laterality: N/A;  1:30pm  . ESOPHAGOGASTRODUODENOSCOPY (EGD) WITH PROPOFOL N/A 03/13/2018   Procedure: ESOPHAGOGASTRODUODENOSCOPY (EGD) WITH PROPOFOL;  Surgeon: Daneil Dolin, MD;  Location: [facility];  Service: Endoscopy;  Laterality: N/A;  2:30pm  . ESOPHAGOGASTRODUODENOSCOPY (EGD) WITH PROPOFOL N/A 08/22/2020   Procedure:  ESOPHAGOGASTRODUODENOSCOPY (EGD) WITH PROPOFOL;  Surgeon: Aviva Signs, MD;  Location: [facility] ORS;  Service: General;  Laterality: N/A;  . PEG PLACEMENT N/A 08/22/2020   Procedure: PERCUTANEOUS ENDOSCOPIC GASTROSTOMY (PEG) PLACEMENT;  Surgeon: Aviva Signs, MD;  Location: [facility] ORS;  Service: General;  Laterality: N/A;  . PORTACATH  PLACEMENT Left 08/22/2020   Procedure: INSERTION PORT-A-CATH;  Surgeon: Aviva Signs, MD;  Location: [facility] ORS;  Service: General;  Laterality: Left;  . SEPTOPLASTY      SOCIAL HISTORY:  Social History   Socioeconomic History  . Marital status: Single    Spouse name: Not on file  . Number of children: Not on file  . Years of education: Not on file  . Highest education level: Not on file  Occupational History  . Not on file  Tobacco Use  . Smoking status: Current Every Day Smoker    Packs/day: 1.00    Years: 20.00    Pack years: 20.00    Types: Cigarettes  . Smokeless tobacco: Never Used  Vaping Use  . Vaping Use: Never used  Substance and Sexual Activity  . Alcohol use: Yes    Comment: couple of beers daily  . Drug use: Not Currently    Types: Marijuana  . Sexual activity: Not Currently  Other Topics Concern  . Not on file  Social History Narrative  . Not on file   Social Determinants of Health   Financial Resource Strain: Low Risk   . Difficulty of Paying Living Expenses: Not very hard  Food Insecurity: No Food Insecurity  . Worried About Charity fundraiser in the Last Year: Never true  . Ran Out of Food in the Last Year: Never true  Transportation Needs: No Transportation Needs  . Lack of Transportation (Medical): No  . Lack of Transportation (Non-Medical): No  Physical Activity: Sufficiently Active  . Days of Exercise per Week: 7 days  . Minutes of Exercise per Session: 60 min  Stress: Stress Concern Present  . Feeling of Stress : To some extent  Social Connections: Moderately Isolated  . Frequency of Communication with Friends and Family: More than three times a week  . Frequency of Social Gatherings with Friends and Family: More than three times a week  . Attends Religious Services: Never  . Active Member of Clubs or Organizations: No  . Attends Archivist Meetings: Never  . Marital Status: Living with partner  Intimate Partner Violence: Not At  Risk  . Fear of Current or Ex-Partner: No  . Emotionally Abused: No  . Physically Abused: No  . Sexually Abused: No    FAMILY HISTORY:  Family History  Problem Relation Age of Onset  . Coronary artery disease Mother        deceased age 12  . Seizures Father   . Alcohol abuse Father   . Colon cancer Neg Hx     CURRENT MEDICATIONS:  Current Outpatient Medications  Medication Sig Dispense Refill  . acetaminophen (TYLENOL) 325 MG tablet Take 2 tablets (650 mg total) by mouth every 6 (six) hours as needed for mild pain (or Fever >/= 101). 12 tablet 0  . albuterol (PROVENTIL) (2.5 MG/3ML) 0.083% nebulizer solution Take 2.5 mg by nebulization every 6 (six) hours as needed for wheezing or shortness of breath.    Marland Kitchen albuterol (VENTOLIN HFA) 108 (90 Base) MCG/ACT inhaler Inhale 2 puffs into the lungs every 6 (six) hours as needed for wheezing or shortness of breath. 18 g 1  .  amLODipine (NORVASC) 10 MG tablet Take 10 mg by mouth daily.     . carbamazepine (TEGRETOL) 200 MG tablet Take 400 mg by mouth 2 (two) times daily.    . chlordiazePOXIDE (LIBRIUM) 25 MG capsule Take 1 capsule (25 mg total) by mouth every 6 (six) hours. 30 capsule 0  . CISPLATIN IV Inject into the vein once a week.    Marland Kitchen dexamethasone 0.5 MG/5ML elixir Take by mouth.    . diclofenac Sodium (VOLTAREN) 1 % GEL Apply 2 g topically daily as needed (pain).     . folic acid (FOLVITE) 1 MG tablet Take 1 tablet (1 mg total) by mouth daily. 30 tablet 2  . gabapentin (NEURONTIN) 300 MG capsule Take 300 mg by mouth 3 (three) times daily.    Marland Kitchen levETIRAcetam (KEPPRA) 500 MG tablet Take 2 tablets (1,000 mg total) by mouth 2 (two) times daily. 60 tablet 5  . lidocaine (XYLOCAINE) 2 % solution Use as directed 5 mLs in the mouth or throat 4 (four) times daily as needed for mouth pain. Swish and swallow 250 mL 0  . lidocaine-prilocaine (EMLA) cream Apply small amount to port a cath site and cover with plastic wrap 1 hour prior to chemotherapy  appointments 30 g 3  . LORazepam (ATIVAN) 1 MG tablet Take 1 tablet (1 mg total) by mouth daily as needed for up to 10 doses for anxiety (For air hunger). 10 tablet 0  . magic mouthwash SOLN Take by mouth.    . Multiple Vitamin (MULTIVITAMIN WITH MINERALS) TABS tablet Take 1 tablet by mouth daily. 90 tablet 1  . ondansetron (ZOFRAN) 4 MG tablet Take 1 tablet (4 mg total) by mouth every 6 (six) hours as needed for nausea. 20 tablet 0  . Oxycodone HCl 10 MG TABS Take 1 tablet (10 mg total) by mouth every 12 (twelve) hours as needed. 60 tablet 0  . pantoprazole (PROTONIX) 40 MG tablet Take 1 tablet (40 mg total) by mouth daily. 30 tablet 5  . phenol (CHLORASEPTIC) 1.4 % LIQD Use as directed 1 spray in the mouth or throat as needed for throat irritation / pain. 118 mL 0  . prochlorperazine (COMPAZINE) 10 MG tablet Take 1 tablet (10 mg total) by mouth every 6 (six) hours as needed for nausea or vomiting. 30 tablet 2  . SSD 1 % cream Apply topically in the morning and at bedtime.    . thiamine 100 MG tablet Take 1 tablet (100 mg total) by mouth daily. 30 tablet 2  . topiramate (TOPAMAX) 25 MG tablet Take 1 tablet (25 mg total) by mouth See admin instructions. Take 1 tablet (25 mg) twice daily for 1 week, then increase to 2 tablets (50 mg) twice daily 120 tablet 2   No current facility-administered medications for this visit.    ALLERGIES:  No Known Allergies  PHYSICAL EXAM:  Performance status (ECOG): 1 - Symptomatic but completely ambulatory  Vitals:   01/12/21 1034  BP: 134/73  Pulse: 78  Resp: 17  Temp: 97.7 F (36.5 C)  SpO2: 100%   Wt Readings from Last 3 Encounters:  01/12/21 95 lb 6 oz (43.3 kg)  12/15/20 99 lb 11.2 oz (45.2 kg)  12/14/20 94 lb 11.2 oz (43 kg)   Physical Exam   LABORATORY DATA:  I have reviewed the labs as listed.  CBC Latest Ref Rng & Units 12/14/2020 11/01/2020 10/18/2020  WBC 4.0 - 10.5 K/uL 5.5 3.6(L) 3.4(L)  Hemoglobin 13.0 - 17.0  g/dL 11.5(L) 10.2(L)  9.9(L)  Hematocrit 39.0 - 52.0 % 33.7(L) 28.6(L) 28.9(L)  Platelets 150 - 400 K/uL 188 139(L) 151   CMP Latest Ref Rng & Units 12/14/2020 11/01/2020 10/19/2020  Glucose 70 - 99 mg/dL 102(H) 110(H) 80  BUN 6 - 20 mg/dL 6 <5(L) 8  Creatinine 0.61 - 1.24 mg/dL 0.30(L) <0.30(L) <0.30(L)  Sodium 135 - 145 mmol/L 127(L) 123(L) 125(L)  Potassium 3.5 - 5.1 mmol/L 3.3(L) 3.4(L) 5.1  Chloride 98 - 111 mmol/L 91(L) 82(L) 90(L)  CO2 22 - 32 mmol/L 27 30 28   Calcium 8.9 - 10.3 mg/dL 8.8(L) 9.4 8.7(L)  Total Protein 6.5 - 8.1 g/dL 7.8 7.6 -  Total Bilirubin 0.3 - 1.2 mg/dL 0.5 0.5 -  Alkaline Phos 38 - 126 U/L 101 110 -  AST 15 - 41 U/L 17 17 -  ALT 0 - 44 U/L 14 14 -    DIAGNOSTIC IMAGING:  I have independently reviewed the scans and discussed with the patient. CT SOFT TISSUE NECK W CONTRAST  Result Date: 12/15/2020 CLINICAL DATA:  45 year old male 1st post treatment neck CT 4 epiglottic carcinoma. EXAM: CT NECK WITH CONTRAST TECHNIQUE: Multidetector CT imaging of the neck was performed using the standard protocol following the bolus administration of intravenous contrast. CONTRAST:  69mL OMNIPAQUE IOHEXOL 300 MG/ML  SOLN COMPARISON:  PET-CT 08/05/2020. [facility] neck CT 08/31/2020. CTA chest 10/26/2012. FINDINGS: Pharynx and larynx: Diffuse pharyngeal mucosal space thickening compatible with sequelae of XRT. At the level of the epiglottis there is significant residual asymmetry including 7-8 mm nodularity of the right AE fold (series 2, image 53) and asymmetric low-density soft tissue along the posterior wall of the right hypopharynx (series 2, image 57). Compared to 08/31/2020 there has been substantial decreased soft tissue mass along the midline and left epiglottis. Soft tissue mass has regressed along the left AE fold, and the true cords now appear more symmetric. Diffuse superficial enhancement is compatible with a degree of mucositis. Parapharyngeal and retropharyngeal spaces appear  within normal limits. p Salivary glands: Diffuse glandular hyperenhancement compatible with XRT. Negative sublingual space. Thyroid: Negative. Lymph nodes: Regressed dominant right level 2 malignant nodes, with obscured soft tissue planes following XRT. Residual soft tissue in the region of the dominant formally 2 cm node is now 10 mm on series 2, image 62. Smaller malignant left level 2A lymph node has regressed with diminutive 4-5 mm enhancing nodal tissue on series 2, image 49. Other bilateral cervical nodes are enhancing now but remain normal by size criteria. Vascular: Advanced calcified atherosclerosis in the neck and at the skull base. Hemodynamically significant cervical ICA stenosis at the distal bulbs greater on the left (series 2, image 47 and numerically estimated at 70 % with respect to the distal vessel. Dominant left vertebral artery. Both internal jugular veins appear chronically occluded/atrophic. Limited intracranial: Small chronic right cerebellar infarct appears stable. Visualized orbits: Negative. Mastoids and visualized paranasal sinuses: Chronic bilateral sinusitis, most pronounced in the maxillary sinuses and not significantly changed since last year. Visible tympanic cavities and mastoids remain clear. Skeleton: Absent dentition. Osteopenia. Chronic deformity left mandible condyle. Mild for age cervical spine degeneration. Stable chronic T4 and T5 superior endplate compression since 10/26/2012. Congenital incomplete ossification of the posterior C1 ring. No acute or suspicious osseous lesion identified. Upper chest: Severe upper lung disease, especially left upper lobe bronchiectasis, scarring, and architectural distortion. Superimposed paraseptal emphysema. Visible lung parenchyma appears stable since 08/31/2020. Calcified aortic atherosclerosis. Trace retained secretions in  the trachea. Left chest Port-A-Cath. No superior mediastinal lymphadenopathy. Other: None. IMPRESSION: 1. NI-RADS  category 2B, recommend short interval Neck CT follow-up in 3 months or repeat PET-CT. Sequelae of XRT in the neck with: - regressed but not resolved bilateral epiglottic and hypopharynx mass-like soft tissue. - regressed malignant level II lymph nodes, 10 mm residual on the right. 2. Extensive calcified atherosclerosis, including evidence of hemodynamically significant ICA stenosis greater on the left (estimated at 70%). Aortic Atherosclerosis (ICD10-I70.0). Follow-up Carotid Doppler Ultrasound may be valuable. 3. Severe upper lobe lung disease with Bronchiectasis, Emphysema (ICD10-J43.9, architectural distortion, and architectural distortion. Electronically Signed   By: Genevie Ann M.D.   On: 12/15/2020 07:56   NM PET Image Restag (PS) Skull Base To Thigh  Result Date: 01/10/2021 CLINICAL DATA:  Subsequent treatment strategy for epiglottic head neck cancer. Restaging. Chemotherapy for 1 month. Neck radiation therapy. EXAM: NUCLEAR MEDICINE PET SKULL BASE TO THIGH TECHNIQUE: For 6.4 mCi F-18 FDG was injected intravenously. Full-ring PET imaging was performed from the skull base to thigh after the radiotracer. CT data was obtained and used for attenuation correction and anatomic localization. Fasting blood glucose: 74 mg/dl COMPARISON:  08/05/2020 PET.  Neck CT 12/14/2020. FINDINGS: Mediastinal blood pool activity: SUV max 1.7 Liver activity: SUV max NA NECK: The previously described bilateral cervical nodal hypermetabolism has resolved. Hypermetabolism within the epiglottis is significantly improved, including at a S.U.V. max of 4.6 today versus a S.U.V. max of 11.2 on the prior exam. Subjacent to this is supraglottic laryngeal hypermetabolism at a S.U.V. max of 5.4. Concurrent soft tissue fullness, including on 107/3. Incidental CT findings: Bilateral carotid atherosclerosis. Mucosal thickening of both maxillary sinuses. CHEST: A high right paratracheal node measures 7 mm and a S.U.V. max of 2.8 on 129/3. New since  the prior. Hypermetabolism within other thoracic nodes has resolved. Redemonstration of extensive peribronchovascular reticulonodular opacities with hypermetabolism. Bilateral upper lobe bronchiectasis. Example posterior right upper lobe hypermetabolism and soft tissue thickening including at a S.U.V. max of 2.1 on 145/3. Slightly less nodular than on the prior exam, where it measured a S.U.V. max of 5.9. Superior segment left lower lobe interstitial thickening and bronchiectasis measure a S.U.V. max of 3.3 on 158/3, increased. No typical findings of pulmonary metastasis. Incidental CT findings: Age advanced aortic and coronary artery atherosclerosis. Centrilobular emphysema. Left Port-A-Cath tip in mid right atrium. ABDOMEN/PELVIS: Hypermetabolism along the tract of the gastrostomy tube is presumably inflammatory. No abdominopelvic nodal or parenchymal hypermetabolism to suggest metastatic disease. Incidental CT findings: Cirrhosis is again suspected. Marked chronic calcific pancreatitis. Normal adrenal glands. Normal adrenal glands. Abdominal aortic atherosclerosis. SKELETON: No abnormal marrow activity. Incidental CT findings: Osteopenia. Chronic thoracic compression deformities were detailed previously. IMPRESSION: 1. Response to therapy of head neck primary and cervical nodal metastasis. Residual epiglottic and supraglottic laryngeal hypermetabolism may be treatment related. Cannot exclude residual low volume disease in these areas. 2. Relatively similar appearance of the lungs, again favored to be due to chronic atypical infection. No typical findings of pulmonary metastasis. 3. Aortic atherosclerosis (ICD10-I70.0), coronary artery atherosclerosis and emphysema (ICD10-J43.9). 4. Chronic calcific pancreatitis.  Suspicion of mild cirrhosis. Electronically Signed   By: Abigail Miyamoto M.D.   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 -Ultrasound of the neck on 04/28/2020 with 2.8 x 1.7 cm mass on the right neck and several relatively small hypoechoic  lymph nodes in the left side. -Weight loss of 22 pounds in the last 3 months. No fevers or night sweats. -Positive for dysphagia. -PET CT scan on 08/05/2020 shows epiglottis is thickened and irregular with hypermetabolic. Left level 2 lymph node positive. Right-sided level 2 nodal metastasis measuring 2.7 x 2.2 cm. Bilateral areas of peribronchovascular reticulonodular opacities with hypermetabolism favoring infection. No extra cervical metastatic disease. Thoracic nodes with low-level of hypermetabolism favored to be reactive. -Fiberoptic exam by Dr. Benjamine Mola on 08/02/2020 showed tongue base mass noted that involved the superior aspect of the epiglottis. Arytenoid mucosa was edematous with slight erythema. True vocal cords were pale yellow and edematous but without mass or lesion. -FNA of the right neck lymph node on 08/02/2020 consistent with squamous cell carcinoma. -Chemoradiation therapy with weekly cisplatin from 09/05/2020 through week 7 of chemotherapy on 10/17/2020 and XRT ended on 10/31/2020.  2. Social/family history: -Worked in the roofing, now on disability. -1 pack/day for 30 years, active smoker. -3 beers per day for the last 10 years.   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 -Reviewed PET scan from 01/09/2021 which showed response to therapy in the primary and cervical lymph node meta stasis with residual epiglottic and supraglottic laryngeal hypermetabolism, which could be treatment related. -He has previously canceled appointment with Dr. Benjamine Mola.  I have counseled him to follow-up with Dr. Benjamine Mola for direct visualization. -He was also told to continue follow-up with radiation oncology. -RTC 6 months with repeat CT of the neck with contrast and TSH level.  2.  Weight loss: -I have encouraged him to increase Osmolite 1.5 to 4 cans daily.  3. Right neck pain: -I have given the last prescription of oxycodone at last visit.  Pain should improve gradually.  4. Ongoing EtOH use: -He reports that he has not been drinking beer lately.  5. 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   Orders placed this encounter:  No orders of the defined types were placed in this encounter.    Derek Jack, MD [place] [phone]   I, Milinda Antis, am acting as a scribe for Dr. Sanda Linger.  I, Derek Jack MD, have reviewed the above documentation for accuracy and completeness, and I agree with the above.

## 2021-01-12 NOTE — Patient Instructions (Signed)
[place] at [facility] Discharge Instructions  You were seen today by Dr. Delton Coombes. He went over your recent results and scans. Make an appointment to see Dr. Benjamine Mola in [place] to have your throat inspected. Put in 2 to 3 cans of Osmolite per day to maintain your nutrition and strength. You will be scheduled to have a CT scan of your neck before your next visit. Dr. Delton Coombes will see you back in 6 months for labs and follow up.   Thank you for choosing [place] at [facility] to provide your oncology and hematology care.  To afford each patient quality time with our provider, please arrive at least 15 minutes before your scheduled appointment time.   If you have a lab appointment with the [facility] please come in thru the Main Entrance and check in at the main information desk  You need to re-schedule your appointment should you arrive 10 or more minutes late.  We strive to give you quality time with our providers, and arriving late affects you and other patients whose appointments are after yours.  Also, if you no show three or more times for appointments you may be dismissed from the clinic at the providers discretion.     Again, thank you for choosing [facility].  Our hope is that these requests will decrease the amount of time that you wait before being seen by our physicians.       _____________________________________________________________  Should you have questions after your visit to [facility], please contact our office at [phone] between the hours of 8:00 a.m. and 4:30 p.m.  Voicemails left after 4:00 p.m. will not be returned until the following business day.  For prescription refill requests, have your pharmacy contact our office and allow 72 hours.    Cancer Center Support Programs:   > Cancer Support Group  2nd Tuesday of the month 1pm-2pm, Journey Room

## 2021-01-13 ENCOUNTER — Ambulatory Visit (HOSPITAL_COMMUNITY): Payer: Medicaid Other

## 2021-01-13 MED ORDER — OSMOLITE 1.5 CAL PO LIQD: ORAL | 0 refills | Status: AC

## 2021-01-13 NOTE — Progress Notes (Signed)
Nutrition Follow-up:  Patient with squamous cell carcinoma of epiglottis. He  completed chemotherapy and radiation therapy on 1/03.  Spoke with patient via phone this afternoon on second attempt at reaching him today. He is difficult to understand at times, reports severe soreness in throat and barely able to swallow. He tried to eat a ham sandwich earlier. He reports 3 cartons Osmolite 1.5 daily. He does not want to increase feedings, states this is too hard to do all by yourself. Patient reports his mouth his dry and hurts to drink water. He is giving some water via tube. He has not called ENT office to make appointment, says he does not have transportation. Patient significant other in background reports she would provide transportation.   Medications: reviewed  Labs: reviewed  Anthropometrics:   Weight 95.4 lb on 3/17 decreased from 101 lb (noted per [facility]) on 2/3  99 lb on 3/15 103 lb on 12/20  Estimated Energy Needs  Kcals: 1500-1700 Protein: 75-85 Fluid: 1.5 L  NUTRITION DIAGNOSIS: Inadequate oral intake continues  INTERVENTION:  Patient is agreeable to nocturnal feedings via pump, home health ([facility]) contacted.   Encouraged patient to make appointment with ENT  Bolus 1 carton (237 ml) Osmolite 1.5 daily via PEG with 60 ml free water flush before and after each feeding  Continuous Osmolite 1.5 - 4 cartons (948 ml) x 12 hours (8 am - 8pm) -Start Osmolite 1.5 at 30 ml x 12 hours, increase by 10 ml each day to goal rate 80 ml/hour x 12 hours  120 ml free water flush via PEG before and after continuous feeds  Drink orally or give via PEG additional 474 ml water (16 ounces) daily  Provides 1775 kcal, 75 grams protein, 905 ml H20 (1739 ml total water with flushes) Meets 100% calorie and protein needs   MONITORING, EVALUATION, GOAL: tube feeds, weight trends, oral intake   NEXT VISIT: Friday, April 1 via telephone

## 2021-01-17 ENCOUNTER — Telehealth: Payer: Self-pay | Admitting: Dietician

## 2021-01-17 NOTE — Telephone Encounter (Signed)
Nutrition   Nutrition follow-up with patient completed via phone. He reports pump delivered yesterday by [facility], pump education and instructions for increasing tube feed regimen daily to goal provided by [facility] to patient and significant other. Patient reports he did not use pump last night, says he is going to try it out tonight. He has no questions on how to use pump. Patient verbalized accurate tube regimen of Osmolite 1.5 @ 30 ml/hr x 12 hours, increase by 10 ml each night to goal rate 80 ml/hr x 12 hrs with 120 ml water before and after. Patient has given 1 bolus feeding today. Tube feed regimen has also been provided to patient via mail. Will follow-up with patient via telephone on Friday 3/25.

## 2021-01-27 ENCOUNTER — Inpatient Hospital Stay (HOSPITAL_COMMUNITY): Payer: Medicaid Other | Attending: Hematology

## 2021-01-27 NOTE — Progress Notes (Signed)
Nutrition Follow-up:  Patient with squamous cell carcinoma of epiglottis. He has completed chemotherapy and radiation therapy on 1/03. S/p PEG 10/25.  Spoke with patient via phone. He reports increased compliance with tube feedings, says "I'm ready to feel better" He has been using pump at night, reports tube occassionally leaks and he wakes up with tube feed in his bed. If he does not use pump, is bolusing 5 cartons Osmolite 1.5/day. Patient reports eating soft and moist foods. Today he has had a gravy biscuit with potato wedges, bowl of golden grahams, and 1 carton of Osmolite. He is flushing with 60 ml H20 before and after feedings. Reports drinking "a ton" of water by mouth.    Medications: reviewed Labs: reviewed  Anthropometrics: Patient reports he is gaining weight and currently weighs 99.5 lbs on home scale.  3/17 - 95 lbs 6 oz 2/16 - 94 lb 11.2 oz  Estimated Energy Needs  Kcals: 1500-1700 Protein: 75-85 Fluid: 1.5 L  NUTRITION DIAGNOSIS: Inadequate oral intake improving   INTERVENTION:  [place] agency ([facility]) contacted about poor tube connection, mailing patient an adaptor Encouraged patient to continue with tube feeds Encouraged continued oral intake of soft/moist high protein, high calorie foods   MONITORING, EVALUATION, GOAL:  Weight trends, oral intake, tube feeds  NEXT VISIT:  Monday, April 18 via telephone

## 2021-02-13 ENCOUNTER — Telehealth (HOSPITAL_COMMUNITY): Payer: Self-pay | Admitting: Dietician

## 2021-02-13 ENCOUNTER — Encounter (HOSPITAL_COMMUNITY): Payer: Medicaid Other | Admitting: Dietician

## 2021-02-13 NOTE — Telephone Encounter (Signed)
Nutrition Follow-up:  Patient with squamous cell carcinoma of epiglottis. He has completed chemotherapy and radiation treatment on 1/03. S/p PEG on 10/25.  Spoke with pt via telephone. He reports throat soreness has not improved, eating and drinking by mouth is painful but he continues to try. He reports eating cereals and sandwich foods. Patient reports receiving adaptor connection for feeding pump to address previously reported occasional leaking of tube feed during overnight continuous feeds. He reports he is not using the pump, states "I go too much shit everywhere to drag that thing in there" Patient reports bolusing 3 cartons Osmolite 1.5. He is flushing tube with 60 ml water 6-8 times daily. Patient reports he has appointment with ENT tomorrow (4/19) at 1:30 PM.   Medications: reviewed  Labs: reviewed  Anthropometrics: Patient reports not weighing in a while, suspects he has lost a few pounds. He weighed 99.5 lb on home scale 4 on 4/1   Estimated Energy Needs  Kcals: 1500-1700 Protein: 75-85 Fluid: >1.5 L  NUTRITION DIAGNOSIS: Inadequate oral intake ongoing    INTERVENTION:  Educated on the importance of adequate nutrition/hydration  Encouraged patient to use feeding pump or increase bolus feedings to meet goal of 5 cartons/day Patient agrees to give 4 cartons today Reviewed soft and moist high protein foods  Osmolite 1.5 - 5 cartons daily with 60 ml water before and after each bolus provides 1775 kcal, 74.5 g protein, and 1500 ml free water  MONITORING, EVALUATION, GOAL: weight trends, intake, tube feeding   NEXT VISIT: Thursday April 28 via telephone

## 2021-02-14 ENCOUNTER — Other Ambulatory Visit: Payer: Self-pay | Admitting: Otolaryngology

## 2021-02-17 ENCOUNTER — Encounter (HOSPITAL_COMMUNITY): Payer: Self-pay | Admitting: Otolaryngology

## 2021-02-17 NOTE — Progress Notes (Signed)
EKG: DOS CXR: 11/20/20 ECHO: denies Stress Test: denies Cardiac Cath: denies  Fasting Blood Sugar- na Checks Blood Sugar_na__ times a day  OSA/CPAP: No  ASA/Blood Thinners: No  Covid test DOS  Anesthesia Review: No  Patient denies shortness of breath, fever, cough, and chest pain at PAT appointment.  Patient verbalized understanding of instructions provided today at the PAT appointment.  Patient asked to review instructions at home and day of surgery.

## 2021-02-20 ENCOUNTER — Other Ambulatory Visit: Payer: Self-pay

## 2021-02-20 ENCOUNTER — Ambulatory Visit (HOSPITAL_COMMUNITY): Payer: Medicaid Other

## 2021-02-20 ENCOUNTER — Encounter (HOSPITAL_COMMUNITY)
Admission: RE | Disposition: A | Discharge status: Home / Self Care | Payer: Self-pay | Source: Home / Self Care | Attending: Otolaryngology

## 2021-02-20 ENCOUNTER — Ambulatory Visit (HOSPITAL_COMMUNITY)
Admission: RE | Admit: 2021-02-20 | Discharge: 2021-02-20 | Disposition: A | Discharge status: Home / Self Care | Payer: Medicaid Other | Attending: Otolaryngology | Admitting: Otolaryngology

## 2021-02-20 ENCOUNTER — Encounter (HOSPITAL_COMMUNITY): Payer: Self-pay | Admitting: Otolaryngology

## 2021-02-20 DIAGNOSIS — F17200 Nicotine dependence, unspecified, uncomplicated: Secondary | ICD-10-CM | POA: Diagnosis not present

## 2021-02-20 DIAGNOSIS — C321 Malignant neoplasm of supraglottis: Secondary | ICD-10-CM | POA: Diagnosis present

## 2021-02-20 DIAGNOSIS — Z20822 Contact with and (suspected) exposure to covid-19: Secondary | ICD-10-CM | POA: Insufficient documentation

## 2021-02-20 DIAGNOSIS — Z923 Personal history of irradiation: Secondary | ICD-10-CM | POA: Diagnosis not present

## 2021-02-20 DIAGNOSIS — J387 Other diseases of larynx: Secondary | ICD-10-CM | POA: Diagnosis not present

## 2021-02-20 DIAGNOSIS — Z8521 Personal history of malignant neoplasm of larynx: Secondary | ICD-10-CM | POA: Insufficient documentation

## 2021-02-20 DIAGNOSIS — Z9221 Personal history of antineoplastic chemotherapy: Secondary | ICD-10-CM | POA: Insufficient documentation

## 2021-02-20 DIAGNOSIS — Z85810 Personal history of malignant neoplasm of tongue: Secondary | ICD-10-CM | POA: Insufficient documentation

## 2021-02-20 HISTORY — DX: Essential (primary) hypertension: I10

## 2021-02-20 HISTORY — PX: DIRECT LARYNGOSCOPY: SHX5326

## 2021-02-20 LAB — COMPREHENSIVE METABOLIC PANEL
ALT: 14 U/L (ref 0–44)
AST: 20 U/L (ref 15–41)
Albumin: 3.3 g/dL — ABNORMAL LOW (ref 3.5–5.0)
Alkaline Phosphatase: 84 U/L (ref 38–126)
Anion gap: 11 (ref 5–15)
BUN: <5 mg/dL — ABNORMAL LOW (ref 6–20)
CO2: 27 mmol/L (ref 22–32)
Calcium: 9.3 mg/dL (ref 8.9–10.3)
Chloride: 87 mmol/L — ABNORMAL LOW (ref 98–111)
Creatinine, Ser: 0.31 mg/dL — ABNORMAL LOW (ref 0.61–1.24)
GFR, Estimated: >60 mL/min (ref >60)
Glucose, Bld: 109 mg/dL — ABNORMAL HIGH (ref 70–99)
Potassium: 3.7 mmol/L (ref 3.5–5.1)
Sodium: 125 mmol/L — ABNORMAL LOW (ref 135–145)
Total Bilirubin: 0.5 mg/dL (ref 0.3–1.2)
Total Protein: 7.9 g/dL (ref 6.5–8.1)

## 2021-02-20 LAB — CBC
HCT: 35.8 % — ABNORMAL LOW (ref 39.0–52.0)
Hemoglobin: 12 g/dL — ABNORMAL LOW (ref 13.0–17.0)
MCH: 35 pg — ABNORMAL HIGH (ref 26.0–34.0)
MCHC: 33.5 g/dL (ref 30.0–36.0)
MCV: 104.4 fL — ABNORMAL HIGH (ref 80.0–100.0)
Platelets: 318 10*3/uL (ref 150–400)
RBC: 3.43 MIL/uL — ABNORMAL LOW (ref 4.22–5.81)
RDW: 11.9 % (ref 11.5–15.5)
WBC: 5.3 10*3/uL (ref 4.0–10.5)
nRBC: 0 % (ref 0.0–0.2)

## 2021-02-20 LAB — SARS CORONAVIRUS 2 BY RT PCR (HOSPITAL ORDER, PERFORMED IN ~~LOC~~ HOSPITAL LAB): SARS Coronavirus 2: NEGATIVE

## 2021-02-20 SURGERY — LARYNGOSCOPY, DIRECT
Anesthesia: General | Site: Mouth

## 2021-02-20 MED ORDER — OXYCODONE HCL 5 MG PO TABS
5.0000 mg | ORAL_TABLET | Freq: Once | ORAL | Status: AC | PRN
  Administered 2021-02-20: 5 mg via ORAL

## 2021-02-20 MED ORDER — MIDAZOLAM HCL 2 MG/2ML IJ SOLN
INTRAMUSCULAR | Status: AC
  Filled 2021-02-20: qty 2

## 2021-02-20 MED ORDER — FENTANYL CITRATE (PF) 100 MCG/2ML IJ SOLN
25.0000 ug | INTRAMUSCULAR | Status: DC | PRN
  Administered 2021-02-20 (×2): 50 ug via INTRAVENOUS

## 2021-02-20 MED ORDER — PROPOFOL 10 MG/ML IV BOLUS
INTRAVENOUS | Status: DC | PRN
  Administered 2021-02-20: 80 mg via INTRAVENOUS

## 2021-02-20 MED ORDER — DEXAMETHASONE SODIUM PHOSPHATE 10 MG/ML IJ SOLN
INTRAMUSCULAR | Status: AC
  Filled 2021-02-20: qty 1

## 2021-02-20 MED ORDER — PROMETHAZINE HCL 25 MG/ML IJ SOLN: 6.2500 mg | INTRAMUSCULAR | Status: DC | PRN

## 2021-02-20 MED ORDER — CEFAZOLIN SODIUM-DEXTROSE 1-4 GM/50ML-% IV SOLN
INTRAVENOUS | Status: DC | PRN
  Administered 2021-02-20: 1 g via INTRAVENOUS

## 2021-02-20 MED ORDER — FENTANYL CITRATE (PF) 100 MCG/2ML IJ SOLN
INTRAMUSCULAR | Status: AC
  Filled 2021-02-20: qty 2

## 2021-02-20 MED ORDER — CEFAZOLIN SODIUM 1 G IJ SOLR
INTRAMUSCULAR | Status: AC
  Filled 2021-02-20: qty 10

## 2021-02-20 MED ORDER — OXYCODONE HCL 5 MG PO TABS
ORAL_TABLET | ORAL | Status: AC
  Filled 2021-02-20: qty 1

## 2021-02-20 MED ORDER — MIDAZOLAM HCL 2 MG/2ML IJ SOLN
INTRAMUSCULAR | Status: DC | PRN
  Administered 2021-02-20: 2 mg via INTRAVENOUS

## 2021-02-20 MED ORDER — ORAL CARE MOUTH RINSE: 15.0000 mL | Freq: Once | OROMUCOSAL | Status: AC

## 2021-02-20 MED ORDER — SUCCINYLCHOLINE CHLORIDE 20 MG/ML IJ SOLN
INTRAMUSCULAR | Status: DC | PRN
  Administered 2021-02-20: 60 mg via INTRAVENOUS

## 2021-02-20 MED ORDER — ONDANSETRON HCL 4 MG/2ML IJ SOLN
INTRAMUSCULAR | Status: AC
  Filled 2021-02-20: qty 2

## 2021-02-20 MED ORDER — SUGAMMADEX SODIUM 200 MG/2ML IV SOLN
INTRAVENOUS | Status: DC | PRN
  Administered 2021-02-20: 200 mg via INTRAVENOUS

## 2021-02-20 MED ORDER — CHLORHEXIDINE GLUCONATE 0.12 % MT SOLN
15.0000 mL | Freq: Once | OROMUCOSAL | Status: AC
  Administered 2021-02-20: 15 mL via OROMUCOSAL
  Filled 2021-02-20: qty 15

## 2021-02-20 MED ORDER — LACTATED RINGERS IV SOLN: INTRAVENOUS | Status: DC

## 2021-02-20 MED ORDER — OXYCODONE HCL 5 MG/5ML PO SOLN: 5.0000 mg | Freq: Once | ORAL | Status: AC | PRN

## 2021-02-20 MED ORDER — ROCURONIUM BROMIDE 10 MG/ML (PF) SYRINGE
PREFILLED_SYRINGE | INTRAVENOUS | Status: AC
  Filled 2021-02-20: qty 10

## 2021-02-20 MED ORDER — LIDOCAINE 2% (20 MG/ML) 5 ML SYRINGE
INTRAMUSCULAR | Status: AC
  Filled 2021-02-20: qty 5

## 2021-02-20 MED ORDER — DEXAMETHASONE SODIUM PHOSPHATE 10 MG/ML IJ SOLN
INTRAMUSCULAR | Status: DC | PRN
  Administered 2021-02-20: 10 mg via INTRAVENOUS

## 2021-02-20 MED ORDER — OXYCODONE HCL ER 10 MG PO T12A
10.0000 mg | EXTENDED_RELEASE_TABLET | Freq: Once | ORAL | Status: AC
  Administered 2021-02-20: 10 mg via ORAL
  Filled 2021-02-20: qty 1

## 2021-02-20 MED ORDER — LIDOCAINE 2% (20 MG/ML) 5 ML SYRINGE
INTRAMUSCULAR | Status: DC | PRN
  Administered 2021-02-20: 60 mg via INTRAVENOUS

## 2021-02-20 MED ORDER — ROCURONIUM BROMIDE 10 MG/ML (PF) SYRINGE
PREFILLED_SYRINGE | INTRAVENOUS | Status: DC | PRN
  Administered 2021-02-20: 40 mg via INTRAVENOUS

## 2021-02-20 MED ORDER — EPINEPHRINE HCL (NASAL) 0.1 % NA SOLN
NASAL | Status: AC
  Filled 2021-02-20: qty 30

## 2021-02-20 MED ORDER — FENTANYL CITRATE (PF) 250 MCG/5ML IJ SOLN
INTRAMUSCULAR | Status: AC
  Filled 2021-02-20: qty 5

## 2021-02-20 MED ORDER — FENTANYL CITRATE (PF) 100 MCG/2ML IJ SOLN
INTRAMUSCULAR | Status: DC | PRN
  Administered 2021-02-20 (×2): 50 ug via INTRAVENOUS

## 2021-02-20 MED ORDER — EPINEPHRINE HCL (NASAL) 0.1 % NA SOLN
NASAL | Status: DC | PRN
  Administered 2021-02-20: 1 [drp] via TOPICAL

## 2021-02-20 MED ORDER — PROPOFOL 10 MG/ML IV BOLUS
INTRAVENOUS | Status: AC
  Filled 2021-02-20: qty 20

## 2021-02-20 MED ORDER — ONDANSETRON HCL 4 MG/2ML IJ SOLN
INTRAMUSCULAR | Status: DC | PRN
  Administered 2021-02-20: 4 mg via INTRAVENOUS

## 2021-02-20 MED ORDER — SUCCINYLCHOLINE CHLORIDE 200 MG/10ML IV SOSY
PREFILLED_SYRINGE | INTRAVENOUS | Status: AC
  Filled 2021-02-20: qty 10

## 2021-02-20 SURGICAL SUPPLY — 28 items
ADAPTER TUBE FLEX ULTRASET (MISCELLANEOUS) IMPLANT
CANISTER SUCT 1200ML W/VALVE (MISCELLANEOUS) ×2 IMPLANT
CNTNR URN SCR LID CUP LEK RST (MISCELLANEOUS) IMPLANT
CONT SPEC 4OZ STRL OR WHT (MISCELLANEOUS)
COVER WAND RF STERILE (DRAPES) IMPLANT
GAUZE SPONGE 4X4 12PLY STRL LF (GAUZE/BANDAGES/DRESSINGS) ×4 IMPLANT
GLOVE SURG ENC MOIS LTX SZ7.5 (GLOVE) ×2 IMPLANT
GOWN STRL REUS W/ TWL LRG LVL3 (GOWN DISPOSABLE) ×1 IMPLANT
GOWN STRL REUS W/TWL LRG LVL3 (GOWN DISPOSABLE) ×2
GUARD TEETH (MISCELLANEOUS) IMPLANT
MARKER SKIN DUAL TIP RULER LAB (MISCELLANEOUS) IMPLANT
NDL HYPO 18GX1.5 BLUNT FILL (NEEDLE) ×1 IMPLANT
NDL SPNL 22GX7 QUINCKE BK (NEEDLE) IMPLANT
NDL SPNL 25GX3.5 QUINCKE BL (NEEDLE) IMPLANT
NEEDLE HYPO 18GX1.5 BLUNT FILL (NEEDLE) ×2 IMPLANT
NEEDLE SPNL 22GX7 QUINCKE BK (NEEDLE) IMPLANT
NEEDLE SPNL 25GX3.5 QUINCKE BL (NEEDLE) IMPLANT
NS IRRIG 1000ML POUR BTL (IV SOLUTION) ×2 IMPLANT
PACK BASIN DAY SURGERY FS (CUSTOM PROCEDURE TRAY) ×2 IMPLANT
PATTIES SURGICAL .5 X3 (DISPOSABLE) ×2 IMPLANT
SHEET MEDIUM DRAPE 40X70 STRL (DRAPES) ×2 IMPLANT
SLEEVE SCD COMPRESS KNEE MED (STOCKING) IMPLANT
SOLUTION BUTLER CLEAR DIP (MISCELLANEOUS) ×2 IMPLANT
SURGILUBE 2OZ TUBE FLIPTOP (MISCELLANEOUS) IMPLANT
SYR CONTROL 10ML LL (SYRINGE) IMPLANT
SYR TB 1ML LL NO SAFETY (SYRINGE) IMPLANT
TOWEL GREEN STERILE FF (TOWEL DISPOSABLE) ×2 IMPLANT
TUBE CONNECTING 20X1/4 (TUBING) ×2 IMPLANT

## 2021-02-20 NOTE — H&P (Signed)
Cc: Supraglottic squamous cell carcinoma  HPI: The patient is a 45 year old male who returns today for follow up evaluation of his bilateral neck mass. The patient was last seen 6 months ago. He was noted to have a tongue-based mass that involved the superior aspect of the epiglottis with bilateral neck masses. Pathology was consistent with squamous cell carcinoma. The patient completed his chemo/radiation treatment in January. Post treatment PET scan was done which showed hypermetabolic activity along the epiglottis. There is concern for recurrent disease. The patient is still having a lot of difficulty swallowing. He notes severe throat pain. The patient gets most of his nutriction through his G-tube.   Exam: The flexible scope was inserted into the right nasal cavity and advanced towards the nasopharynx.  Visualized mucosa over the turbinates and septum were normal.  The nasopharynx was clear.  Oropharyngeal walls were symmetric and mobile without lesion, mass, or edema.  Necrotic tissue noted along the epiglottis and supraglottic area.  True vocal folds were pale yellow and edematous but without mass or lesion.  Base of tongue was within normal limits. The patient tolerated the procedure well.    Assessment: 1. 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 The patient is s/p chemo/radiation treatment.  2. Post treatment PET scan showed activity along the epiglottis.  3. Laryngoscopy reveals necrotic tissue at the supraglottic area concerning for recurrent disease.   Plan: 1. Laryngoscopy findings are reviewed with the patient.  2. Direct Laryngoscopy with biopsy is recommended. The risks, benefits, details, and alternatives of the procedure are discussed with the patient. Questions are invited and answered. 3. The procedure will be scheduled as soon as possible.

## 2021-02-20 NOTE — Anesthesia Procedure Notes (Addendum)
Procedure Name: Intubation Date/Time: 02/20/2021 10:54 AM Performed by: Barrington Ellison, CRNA Pre-anesthesia Checklist: Patient identified, Emergency Drugs available, Suction available and Patient being monitored Patient Re-evaluated:Patient Re-evaluated prior to induction Oxygen Delivery Method: Circle System Utilized Preoxygenation: Pre-oxygenation with 100% oxygen Induction Type: IV induction and Rapid sequence Ventilation: Mask ventilation without difficulty Laryngoscope Size: Glidescope and 3 Tube type: Oral Tube size: 6.0 mm Number of attempts: 1 Airway Equipment and Method: Stylet and Oral airway Placement Confirmation: ETT inserted through vocal cords under direct vision,  positive ETCO2 and breath sounds checked- equal and bilateral Secured at: 20 cm Tube secured with: Tape Dental Injury: Teeth and Oropharynx as per pre-operative assessment  Difficulty Due To: Difficulty was anticipated and Difficult Airway-  due to edematous airway Comments: Elective Glidescope d/t presence of supraglottic tumor

## 2021-02-20 NOTE — Anesthesia Preprocedure Evaluation (Addendum)
Anesthesia Evaluation  Patient identified by MRN, date of birth, ID band Patient awake    Reviewed: Allergy & Precautions, NPO status , Patient's Chart, lab work & pertinent test results  History of Anesthesia Complications Negative for: history of anesthetic complications  Airway Mallampati: II  TM Distance: >3 FB Neck ROM: Full    Dental  (+) Edentulous Upper, Edentulous Lower   Pulmonary COPD,  COPD inhaler, Current Smoker,    Pulmonary exam normal        Cardiovascular hypertension, Pt. on medications Normal cardiovascular exam     Neuro/Psych Seizures -,  negative psych ROS   GI/Hepatic PUD, GERD  Controlled and Medicated,(+)     substance abuse  alcohol use,   Endo/Other  negative endocrine ROS  Renal/GU negative Renal ROS  negative genitourinary   Musculoskeletal negative musculoskeletal ROS (+)   Abdominal   Peds  Hematology negative hematology ROS (+)   Anesthesia Other Findings Throat ca  Reproductive/Obstetrics negative OB ROS                            Anesthesia Physical Anesthesia Plan  ASA: III  Anesthesia Plan: General   Post-op Pain Management:    Induction: Intravenous  PONV Risk Score and Plan: 2 and Treatment may vary due to age or medical condition, Ondansetron, Dexamethasone and Midazolam  Airway Management Planned: Oral ETT  Additional Equipment: None  Intra-op Plan:   Post-operative Plan: Extubation in OR  Informed Consent: I have reviewed the patients History and Physical, chart, labs and discussed the procedure including the risks, benefits and alternatives for the proposed anesthesia with the patient or authorized representative who has indicated his/her understanding and acceptance.     Dental advisory given  Plan Discussed with: CRNA  Anesthesia Plan Comments:        Anesthesia Quick Evaluation

## 2021-02-20 NOTE — Anesthesia Postprocedure Evaluation (Signed)
Anesthesia Post Note  Patient: [name]  Procedure(s) Performed: DIRECT LARYNGOSCOPY WITH BIOPSY (N/A Mouth)     Patient location during evaluation: PACU Anesthesia Type: General Level of consciousness: awake and alert and oriented Pain management: pain level controlled Vital Signs Assessment: post-procedure vital signs reviewed and stable Respiratory status: spontaneous breathing, nonlabored ventilation and respiratory function stable Cardiovascular status: blood pressure returned to baseline Postop Assessment: no apparent nausea or vomiting Anesthetic complications: no   No complications documented.  Last Vitals:  Vitals:   02/20/21 1230 02/20/21 1235  BP:    Pulse: 61 64  Resp: 12 13  Temp:  36.6 C  SpO2: 100% 99%    Last Pain:  Vitals:   02/20/21 1235  TempSrc:   PainSc: [name]

## 2021-02-20 NOTE — Transfer of Care (Signed)
Immediate Anesthesia Transfer of Care Note  Patient: [name]  Procedure(s) Performed: DIRECT LARYNGOSCOPY WITH BIOPSY (N/A Mouth)  Patient Location: PACU  Anesthesia Type:General  Level of Consciousness: awake, alert  and oriented  Airway & Oxygen Therapy: Patient Spontanous Breathing and Patient connected to nasal cannula oxygen  Post-op Assessment: Report given to RN  Post vital signs: Reviewed and stable  Last Vitals:  Vitals Value Taken Time  BP 127/101 02/20/21 1124  Temp    Pulse 95 02/20/21 1124  Resp 13 02/20/21 1125  SpO2 93 % 02/20/21 1124  Vitals shown include unvalidated device data.  Last Pain:  Vitals:   02/20/21 0844  TempSrc: Oral         Complications: No complications documented.

## 2021-02-20 NOTE — Discharge Instructions (Addendum)
The patient may resume all his previous activities and diet.  The patient will follow up in my office in 1 week.

## 2021-02-20 NOTE — Op Note (Signed)
DATE OF PROCEDURE:  02/20/2021                              OPERATIVE REPORT  SURGEON:  Leta Baptist, MD  PREOPERATIVE DIAGNOSES: 1.  Supraglottic squamous cell carcinoma  POSTOPERATIVE DIAGNOSES: 1.  Supraglottic squamous cell carcinoma  PROCEDURE PERFORMED: Direct laryngoscopy and biopsy  ANESTHESIA:  General endotracheal tube anesthesia.  COMPLICATIONS:  None.  ESTIMATED BLOOD LOSS:  Minimal.  INDICATION FOR PROCEDURE:  [name] is a 45 y.o. male with a history of a tongue-based mass that involved the superior aspect of the epiglottis with bilateral neck masses. Biopsy pathology was consistent with squamous cell carcinoma. The patient completed his chemo/radiation treatment in January. Post treatment PET scan showed hypermetabolic activity along the epiglottis. There is concern for recurrent disease. On his laryngoscopy exam, necrotic tissue was noted at the supraglottic area. Based on the above findings, the decision was made for the patient to undergo the DL and biopsy procedure. The risks, benefits, alternatives, and details of the procedure were discussed with the patient.  Questions were invited and answered.  Informed consent was obtained.  DESCRIPTION:  The patient was taken to the operating room and placed supine on the operating table.  General endotracheal tube anesthesia was administered by the anesthesiologist.  The patient was positioned and prepped and draped in a standard fashion for direct laryngoscopy.  A Dedo laryngoscope was used for the examination.  The Dedo laryngoscope was inserted via the oral cavity into the pharynx.  Examination of the tongue base showed no obvious mass.  However, the epiglottis was partially eroded, with necrotic tissue covering the rest of the epiglottis.  The vocal cords were intact and symmetric.  Piriform sinuses were normal with no lesion or mass.  Multiple biopsy specimens were then obtained from the necrotic area of the epiglottis.  The  specimens were sent to the pathology department for permanent histologic identification.  The Dedo laryngoscope was withdrawn.  The care of the patient was turned over to the anesthesiologist.  The patient was awakened from anesthesia without difficulty.  The patient was extubated and transferred to the recovery room in good condition.  OPERATIVE FINDINGS: Partial erosion of the epiglottis, with necrotic debris covering the rest of the epiglottis.  SPECIMEN: Biopsy specimens from the epiglottis.  FOLLOWUP CARE:  The patient will be discharged home once awake and alert. The patient will follow up in my office in approximately 1 week.  Sakura Denis W Xena Propst 02/20/2021 11:17 AM

## 2021-02-21 ENCOUNTER — Encounter (HOSPITAL_COMMUNITY): Payer: Self-pay | Admitting: Otolaryngology

## 2021-02-21 LAB — SURGICAL PATHOLOGY

## 2021-02-23 ENCOUNTER — Encounter (HOSPITAL_COMMUNITY): Payer: Medicaid Other | Admitting: Dietician

## 2021-02-23 ENCOUNTER — Telehealth (HOSPITAL_COMMUNITY): Payer: Self-pay | Admitting: Dietician

## 2021-02-23 NOTE — Telephone Encounter (Signed)
Nutrition Follow-up:   Patient with squamous cell carcinoma of epiglottis. S/p PEG on 08/22/20. He has completed chemotherapy and radiation therapy on 10/31/20.  Post treatment PET scan concerning for recurrent disease. Patient underwent laryngoscopy with biopsy on 4/25, pathology results currently pending.   Spoke with patient via telephone. He reports not doing good, states "throat hurts like hell" Patient is not eating or drinking by mouth. He giving tube feedings, "but not as much as I need to" Patient reports giving mostly 2 cartons of Osmolite 1.5, but sometimes he is giving 1. Patient reports he wants to gain some weight back, asking how long it will take if he gives 5 cartons/day (goal). He does not want to use feeding pump.   Medications: reviewed  Labs: 4/25 - Na 125, Glucose 109, BUN <5, Cr 0.31, Albumin 3.3   Anthropometrics: Weight 97 lb on 4/25 stable  3/17- 95 lbs 6 oz 2/16 - 94 lbs 11.2 oz   Estimated Energy Needs  Kcals: 1500-1700 Protein: 75-85 Fluid: 1.5 L  NUTRITION DIAGNOSIS: Inadequate oral intake ongoing   INTERVENTION:  Patient will increase Osmolite 1.5 x 1 carton every 2 days to goal   -Osmolite 1.5 x 5 cartons daily via PEG. Flush with 60 ml water before and after each bolus feeding. Provides 1775 kcal, 74.5 grams protein, and 1500 ml total water     MONITORING, EVALUATION, GOAL: weight trends, tube feeding   NEXT VISIT: Monday May 2 via telephone

## 2021-02-27 ENCOUNTER — Telehealth (HOSPITAL_COMMUNITY): Payer: Self-pay | Admitting: Dietician

## 2021-02-27 ENCOUNTER — Encounter (HOSPITAL_COMMUNITY): Payer: Medicaid Other | Admitting: Dietician

## 2021-02-27 NOTE — Telephone Encounter (Signed)
Nutrition Follow-up:  Spoke with patient via telephone. He is s/p treatment for squamous cell carcinoma. Patient underwent laryngoscopy with biopsy on 4/25.   Patient reports he has increased to 3 cartons Osmolite 1.5 daily and will try to give 4 today. Patient addiment about not wanting to use feeding pump. Patient reports cancelling appointment with MD today, he did not feel like going and was in bed.   Estimated Energy Needs  Kcals: 1500-1700 Protein: 75-85 Fluid: 1.5 L  NUTRITION DIAGNOSIS: Inadequate oral intake ongoing   INTERVENTION:  Patient giving 3 cartons Osmolite 1.5 via PEG daily Encouraged increasing tube feeding to goal Patient has pump, encouraged infusing 2 cartons overnight to meet goal of 5 cartons/day Patient does not want to use pump  Osmolite 1.5 - 1.5 cartons via PEG TID with 60 ml free water flush before and after each bolus. Provides 1775 kcal, 74.5 grams protein, and 1500 ml free water.     MONITORING, EVALUATION, GOAL: weight trends, tube feeding   NEXT VISIT: Monday May 9

## 2021-03-06 ENCOUNTER — Telehealth (HOSPITAL_COMMUNITY): Payer: Self-pay | Admitting: Dietician

## 2021-03-06 ENCOUNTER — Encounter (HOSPITAL_COMMUNITY): Payer: Medicaid Other | Admitting: Dietician

## 2021-03-06 NOTE — Telephone Encounter (Signed)
Nutrition Follow-up:  Spoke with patient via telephone. He has completed treatment for squamous cell carcinoma 10/31/20 S/p PEG 08/22/20 Noted recent  laryngoscopy with biopsy on 02/20/21.  Patient reports ongoing sore throat, unable to eat orally. He tried some crab earlier today, spit it out after he could not swallow it. He continues to give 2-3 Osmolite 1.5 via PEG, he is going to try giving 4 today. Patient reports spending most of the day on the couch watching television,   Patient is drinking ~4 bottles of water by mouth, says "it hurts like hell, but I do it" He denies drinking alcohol, reports having 1-2 beers every few days. He does not consider this as drinking compared to the 15-20 beers he use to drink daily. Patient continues to smoke cigarettes, says he would like to quit. He has tried the patch, but it made him "feel weird" and stopped using. Patient is interested in trying Chantix.     Medications: reviewed  Labs: reviewed  Anthropometrics: Noted 95 lb 3.2 oz at [place] office visit on 5/3  4/25 - 97 lb 3/17 - 95 lb 6 oz  Estimated Energy Needs  Kcals: 1500-1700 Protein: 75-85 Fluid: 1.5 L  NUTRITION DIAGNOSIS: Inadequate oral intake ongoing, pt relies on tube feeding   INTERVENTION:  Patient giving 2-3 Osmolite 1.5 via PEG daily Encouraged to continue increasing towards goal of 5 cartons/day Patient "will think" about using feeding pump during the day to increase daily tube feed intake Discussed formula order with [facility], patient did not order full, pt will need to contact to request second shipment. Pt aware, contact information given to pt Patient interested in quitting smoking, in-basket sent to MD  Osmolite 1.5 - 5 cartons via PEG TID with 60 ml free water flush before and after each bolus.  Provides 1775 ml kcal, 74.5 grams protein, 1500 ml free water   MONITORING, EVALUATION, GOAL: weight trends, intake, tube feeding   NEXT VISIT: Monday May 16 via  telephone

## 2021-03-13 ENCOUNTER — Ambulatory Visit (HOSPITAL_COMMUNITY): Payer: Medicaid Other | Admitting: Dietician

## 2021-03-13 ENCOUNTER — Telehealth (HOSPITAL_COMMUNITY): Payer: Self-pay | Admitting: Dietician

## 2021-03-13 NOTE — Progress Notes (Signed)
Nutrition Follow-up:  Patient has completed treatment for squamous cell carcinoma on 10/31/20. S/p PEG 10/25.  Spoke briefly with patient via telephone. He reports he is trying to sleep. Patient reports he has increased tube feedings and is giving 4 cartons of Osmolite 1.5 daily. He is giving water before and after feedings, unsure of how much. Patient says he "just pour some in there" He is not using feeding pump. He does not know if he can increase to 5 cartons/day, but will try.    Medications: reviewed  Labs: reviewed  Anthropometrics: Noted 95 lb 3.2 oz at [place] office visit on 5/3  4/25 - 97 lb 3/17 - 95 lb 6 oz  Estimated Energy Needs  Kcals: 1500-1700 Protein: 75-85 Fluid: 1.5 L  NUTRITION DIAGNOSIS: Inadequate oral intake ongoing, pt relies on feeding tube   INTERVENTION:  Patient increased tube feedings - now giving 4 cartons Osmolite 1.5 via PEG Encouraged to try increasing to goal of 5 cartons daily this week   Osmolite 1.5 - 5 cartons/day via PEG provides 1775 kcal, 74.5 grams protein, 1500 ml free water with 60 ml water flush before and each feeding   MONITORING, EVALUATION, GOAL: weight trends, intake, tube feeding   NEXT VISIT: Monday May 23 via telephone

## 2021-03-20 ENCOUNTER — Ambulatory Visit (HOSPITAL_COMMUNITY): Payer: Medicaid Other | Admitting: Dietician

## 2021-03-20 NOTE — Progress Notes (Signed)
Nutrition  Attempted to contact patient via telephone for scheduled nutrition follow-up. Patient did not answer. Message with request for return call left on voicemail. Contact information provided.

## 2021-04-06 ENCOUNTER — Emergency Department (HOSPITAL_COMMUNITY): Payer: Medicaid Other

## 2021-04-06 ENCOUNTER — Encounter (HOSPITAL_COMMUNITY): Payer: Self-pay | Admitting: Emergency Medicine

## 2021-04-06 ENCOUNTER — Inpatient Hospital Stay (HOSPITAL_COMMUNITY): Payer: Medicaid Other

## 2021-04-06 ENCOUNTER — Other Ambulatory Visit: Payer: Self-pay

## 2021-04-06 ENCOUNTER — Inpatient Hospital Stay (HOSPITAL_COMMUNITY)
Admission: EM | Admit: 2021-04-06 | Discharge: 2021-04-12 | DRG: 871 | Disposition: A | Discharge status: Hospice | Payer: Medicaid Other | Attending: Internal Medicine | Admitting: Internal Medicine

## 2021-04-06 DIAGNOSIS — Z79899 Other long term (current) drug therapy: Secondary | ICD-10-CM

## 2021-04-06 DIAGNOSIS — F1010 Alcohol abuse, uncomplicated: Secondary | ICD-10-CM | POA: Diagnosis present

## 2021-04-06 DIAGNOSIS — Z66 Do not resuscitate: Secondary | ICD-10-CM | POA: Diagnosis present

## 2021-04-06 DIAGNOSIS — E43 Unspecified severe protein-calorie malnutrition: Secondary | ICD-10-CM | POA: Insufficient documentation

## 2021-04-06 DIAGNOSIS — J9601 Acute respiratory failure with hypoxia: Secondary | ICD-10-CM | POA: Diagnosis present

## 2021-04-06 DIAGNOSIS — L8990 Pressure ulcer of unspecified site, unspecified stage: Secondary | ICD-10-CM | POA: Insufficient documentation

## 2021-04-06 DIAGNOSIS — Z20822 Contact with and (suspected) exposure to covid-19: Secondary | ICD-10-CM | POA: Diagnosis present

## 2021-04-06 DIAGNOSIS — R1310 Dysphagia, unspecified: Secondary | ICD-10-CM | POA: Diagnosis present

## 2021-04-06 DIAGNOSIS — Z7189 Other specified counseling: Secondary | ICD-10-CM | POA: Diagnosis not present

## 2021-04-06 DIAGNOSIS — J9621 Acute and chronic respiratory failure with hypoxia: Secondary | ICD-10-CM | POA: Diagnosis present

## 2021-04-06 DIAGNOSIS — F419 Anxiety disorder, unspecified: Secondary | ICD-10-CM | POA: Diagnosis present

## 2021-04-06 DIAGNOSIS — Z681 Body mass index (BMI) 19 or less, adult: Secondary | ICD-10-CM

## 2021-04-06 DIAGNOSIS — E876 Hypokalemia: Secondary | ICD-10-CM | POA: Diagnosis present

## 2021-04-06 DIAGNOSIS — E871 Hypo-osmolality and hyponatremia: Secondary | ICD-10-CM | POA: Diagnosis present

## 2021-04-06 DIAGNOSIS — F17210 Nicotine dependence, cigarettes, uncomplicated: Secondary | ICD-10-CM | POA: Diagnosis present

## 2021-04-06 DIAGNOSIS — J449 Chronic obstructive pulmonary disease, unspecified: Secondary | ICD-10-CM | POA: Diagnosis present

## 2021-04-06 DIAGNOSIS — Z9221 Personal history of antineoplastic chemotherapy: Secondary | ICD-10-CM

## 2021-04-06 DIAGNOSIS — K859 Acute pancreatitis without necrosis or infection, unspecified: Secondary | ICD-10-CM | POA: Diagnosis present

## 2021-04-06 DIAGNOSIS — Z811 Family history of alcohol abuse and dependence: Secondary | ICD-10-CM

## 2021-04-06 DIAGNOSIS — J930 Spontaneous tension pneumothorax: Secondary | ICD-10-CM | POA: Diagnosis present

## 2021-04-06 DIAGNOSIS — J690 Pneumonitis due to inhalation of food and vomit: Secondary | ICD-10-CM | POA: Diagnosis present

## 2021-04-06 DIAGNOSIS — Z859 Personal history of malignant neoplasm, unspecified: Secondary | ICD-10-CM | POA: Diagnosis not present

## 2021-04-06 DIAGNOSIS — Z8669 Personal history of other diseases of the nervous system and sense organs: Secondary | ICD-10-CM

## 2021-04-06 DIAGNOSIS — J440 Chronic obstructive pulmonary disease with acute lower respiratory infection: Secondary | ICD-10-CM | POA: Diagnosis present

## 2021-04-06 DIAGNOSIS — Z8249 Family history of ischemic heart disease and other diseases of the circulatory system: Secondary | ICD-10-CM

## 2021-04-06 DIAGNOSIS — Z8521 Personal history of malignant neoplasm of larynx: Secondary | ICD-10-CM

## 2021-04-06 DIAGNOSIS — R64 Cachexia: Secondary | ICD-10-CM | POA: Diagnosis present

## 2021-04-06 DIAGNOSIS — J939 Pneumothorax, unspecified: Secondary | ICD-10-CM

## 2021-04-06 DIAGNOSIS — Z931 Gastrostomy status: Secondary | ICD-10-CM

## 2021-04-06 DIAGNOSIS — R54 Age-related physical debility: Secondary | ICD-10-CM | POA: Diagnosis not present

## 2021-04-06 DIAGNOSIS — A419 Sepsis, unspecified organism: Secondary | ICD-10-CM | POA: Diagnosis present

## 2021-04-06 DIAGNOSIS — L89151 Pressure ulcer of sacral region, stage 1: Secondary | ICD-10-CM | POA: Diagnosis present

## 2021-04-06 DIAGNOSIS — R7989 Other specified abnormal findings of blood chemistry: Secondary | ICD-10-CM | POA: Diagnosis present

## 2021-04-06 DIAGNOSIS — Z95828 Presence of other vascular implants and grafts: Secondary | ICD-10-CM

## 2021-04-06 DIAGNOSIS — Z515 Encounter for palliative care: Secondary | ICD-10-CM | POA: Diagnosis not present

## 2021-04-06 DIAGNOSIS — J9690 Respiratory failure, unspecified, unspecified whether with hypoxia or hypercapnia: Secondary | ICD-10-CM | POA: Diagnosis present

## 2021-04-06 DIAGNOSIS — C321 Malignant neoplasm of supraglottis: Secondary | ICD-10-CM | POA: Diagnosis present

## 2021-04-06 DIAGNOSIS — J410 Simple chronic bronchitis: Secondary | ICD-10-CM | POA: Diagnosis not present

## 2021-04-06 DIAGNOSIS — J13 Pneumonia due to Streptococcus pneumoniae: Secondary | ICD-10-CM | POA: Diagnosis present

## 2021-04-06 DIAGNOSIS — I10 Essential (primary) hypertension: Secondary | ICD-10-CM | POA: Diagnosis present

## 2021-04-06 DIAGNOSIS — G40909 Epilepsy, unspecified, not intractable, without status epilepticus: Secondary | ICD-10-CM | POA: Diagnosis present

## 2021-04-06 DIAGNOSIS — Z923 Personal history of irradiation: Secondary | ICD-10-CM | POA: Diagnosis not present

## 2021-04-06 DIAGNOSIS — K219 Gastro-esophageal reflux disease without esophagitis: Secondary | ICD-10-CM | POA: Diagnosis present

## 2021-04-06 DIAGNOSIS — R001 Bradycardia, unspecified: Secondary | ICD-10-CM | POA: Diagnosis present

## 2021-04-06 DIAGNOSIS — R7401 Elevation of levels of liver transaminase levels: Secondary | ICD-10-CM | POA: Diagnosis present

## 2021-04-06 DIAGNOSIS — R069 Unspecified abnormalities of breathing: Secondary | ICD-10-CM

## 2021-04-06 DIAGNOSIS — J942 Hemothorax: Secondary | ICD-10-CM

## 2021-04-06 DIAGNOSIS — J9600 Acute respiratory failure, unspecified whether with hypoxia or hypercapnia: Secondary | ICD-10-CM

## 2021-04-06 DIAGNOSIS — E8809 Other disorders of plasma-protein metabolism, not elsewhere classified: Secondary | ICD-10-CM | POA: Diagnosis present

## 2021-04-06 LAB — GLUCOSE, CAPILLARY
Glucose-Capillary: 124 mg/dL — ABNORMAL HIGH (ref 70–99)
Glucose-Capillary: 65 mg/dL — ABNORMAL LOW (ref 70–99)
Glucose-Capillary: 125 mg/dL — ABNORMAL HIGH (ref 70–99)
Glucose-Capillary: 131 mg/dL — ABNORMAL HIGH (ref 70–99)
Glucose-Capillary: 180 mg/dL — ABNORMAL HIGH (ref 70–99)
Glucose-Capillary: 229 mg/dL — ABNORMAL HIGH (ref 70–99)
Glucose-Capillary: 259 mg/dL — ABNORMAL HIGH (ref 70–99)

## 2021-04-06 LAB — COMPREHENSIVE METABOLIC PANEL
ALT: 14 U/L (ref 0–44)
ALT: 472 U/L — ABNORMAL HIGH (ref 0–44)
AST: 35 U/L (ref 15–41)
AST: 1484 U/L — ABNORMAL HIGH (ref 15–41)
Albumin: 3 g/dL — ABNORMAL LOW (ref 3.5–5.0)
Albumin: 2.4 g/dL — ABNORMAL LOW (ref 3.5–5.0)
Alkaline Phosphatase: 129 U/L — ABNORMAL HIGH (ref 38–126)
Alkaline Phosphatase: 87 U/L (ref 38–126)
Anion gap: 16 — ABNORMAL HIGH (ref 5–15)
Anion gap: 13 (ref 5–15)
BUN: <5 mg/dL — ABNORMAL LOW (ref 6–20)
BUN: <5 mg/dL — ABNORMAL LOW (ref 6–20)
CO2: 24 mmol/L (ref 22–32)
CO2: 28 mmol/L (ref 22–32)
Calcium: 8 mg/dL — ABNORMAL LOW (ref 8.9–10.3)
Calcium: 7.4 mg/dL — ABNORMAL LOW (ref 8.9–10.3)
Chloride: 69 mmol/L — ABNORMAL LOW (ref 98–111)
Chloride: 70 mmol/L — ABNORMAL LOW (ref 98–111)
Creatinine, Ser: <0.3 mg/dL — ABNORMAL LOW (ref 0.61–1.24)
Creatinine, Ser: 0.41 mg/dL — ABNORMAL LOW (ref 0.61–1.24)
GFR, Estimated: >60 mL/min (ref >60)
Glucose, Bld: 213 mg/dL — ABNORMAL HIGH (ref 70–99)
Glucose, Bld: 147 mg/dL — ABNORMAL HIGH (ref 70–99)
Potassium: 3.2 mmol/L — ABNORMAL LOW (ref 3.5–5.1)
Potassium: 3.1 mmol/L — ABNORMAL LOW (ref 3.5–5.1)
Sodium: 109 mmol/L — CL (ref 135–145)
Sodium: 111 mmol/L — CL (ref 135–145)
Total Bilirubin: 0.6 mg/dL (ref 0.3–1.2)
Total Bilirubin: 0.5 mg/dL (ref 0.3–1.2)
Total Protein: 7.1 g/dL (ref 6.5–8.1)
Total Protein: 5.4 g/dL — ABNORMAL LOW (ref 6.5–8.1)

## 2021-04-06 LAB — RAPID URINE DRUG SCREEN, HOSP PERFORMED
Amphetamines: NOT DETECTED
Barbiturates: NOT DETECTED
Benzodiazepines: NOT DETECTED
Cocaine: NOT DETECTED
Opiates: NOT DETECTED
Tetrahydrocannabinol: NOT DETECTED

## 2021-04-06 LAB — STREP PNEUMONIAE URINARY ANTIGEN: Strep Pneumo Urinary Antigen: POSITIVE — AB

## 2021-04-06 LAB — BLOOD GAS, VENOUS
Acid-Base Excess: 0.1 mmol/L (ref 0.0–2.0)
Bicarbonate: 21.9 mmol/L (ref 20.0–28.0)
FIO2: 98
O2 Saturation: 31.8 %
Patient temperature: 36.2
pCO2, Ven: 58.7 mmHg (ref 44.0–60.0)
pH, Ven: 7.272 (ref 7.250–7.430)
pO2, Ven: <31 mmHg — CL (ref 32.0–45.0)

## 2021-04-06 LAB — PROCALCITONIN: Procalcitonin: 5.38 ng/mL

## 2021-04-06 LAB — CBC
HCT: 38 % — ABNORMAL LOW (ref 39.0–52.0)
Hemoglobin: 13.1 g/dL (ref 13.0–17.0)
MCH: 34.3 pg — ABNORMAL HIGH (ref 26.0–34.0)
MCHC: 34.5 g/dL (ref 30.0–36.0)
MCV: 99.5 fL (ref 80.0–100.0)
Platelets: 220 10*3/uL (ref 150–400)
RBC: 3.82 MIL/uL — ABNORMAL LOW (ref 4.22–5.81)
RDW: 12.2 % (ref 11.5–15.5)
WBC: 23.2 10*3/uL — ABNORMAL HIGH (ref 4.0–10.5)
nRBC: 0 % (ref 0.0–0.2)

## 2021-04-06 LAB — TYPE AND SCREEN
ABO/RH(D): A POS
Antibody Screen: NEGATIVE

## 2021-04-06 LAB — TROPONIN I (HIGH SENSITIVITY)
Troponin I (High Sensitivity): 1317 ng/L (ref <18)
Troponin I (High Sensitivity): 1459 ng/L (ref <18)
Troponin I (High Sensitivity): 1316 ng/L (ref <18)
Troponin I (High Sensitivity): 1306 ng/L (ref <18)

## 2021-04-06 LAB — RESP PANEL BY RT-PCR (FLU A&B, COVID) ARPGX2
Influenza A by PCR: NEGATIVE
Influenza B by PCR: NEGATIVE
SARS Coronavirus 2 by RT PCR: NEGATIVE

## 2021-04-06 LAB — BRAIN NATRIURETIC PEPTIDE: B Natriuretic Peptide: 624 pg/mL — ABNORMAL HIGH (ref 0.0–100.0)

## 2021-04-06 LAB — LACTIC ACID, PLASMA
Lactic Acid, Venous: 7 mmol/L (ref 0.5–1.9)
Lactic Acid, Venous: 4.5 mmol/L (ref 0.5–1.9)
Lactic Acid, Venous: 3.2 mmol/L (ref 0.5–1.9)

## 2021-04-06 LAB — PROTIME-INR
INR: 1.2 (ref 0.8–1.2)
Prothrombin Time: 15.4 seconds — ABNORMAL HIGH (ref 11.4–15.2)

## 2021-04-06 LAB — PHOSPHORUS: Phosphorus: 2.4 mg/dL — ABNORMAL LOW (ref 2.5–4.6)

## 2021-04-06 LAB — MRSA PCR SCREENING: MRSA by PCR: NEGATIVE

## 2021-04-06 LAB — APTT: aPTT: 33 seconds (ref 24–36)

## 2021-04-06 LAB — MAGNESIUM: Magnesium: 1.5 mg/dL — ABNORMAL LOW (ref 1.7–2.4)

## 2021-04-06 MED ORDER — SODIUM CHLORIDE 0.9 % IV SOLN
100.0000 mg | Freq: Two times a day (BID) | INTRAVENOUS | Status: DC
  Administered 2021-04-06 (×2): 100 mg via INTRAVENOUS
  Filled 2021-04-06 (×4): qty 100

## 2021-04-06 MED ORDER — LEVETIRACETAM 100 MG/ML PO SOLN
1000.0000 mg | Freq: Two times a day (BID) | ORAL | Status: DC
  Administered 2021-04-06 – 2021-04-12 (×12): 1000 mg
  Filled 2021-04-06 (×14): qty 10

## 2021-04-06 MED ORDER — SODIUM CHLORIDE 0.9 % IV BOLUS: 500.0000 mL | Freq: Once | INTRAVENOUS | Status: DC

## 2021-04-06 MED ORDER — DEXTROSE 50 % IV SOLN: 12.5000 g | INTRAVENOUS | Status: AC

## 2021-04-06 MED ORDER — IPRATROPIUM-ALBUTEROL 0.5-2.5 (3) MG/3ML IN SOLN: 3.0000 mL | Freq: Once | RESPIRATORY_TRACT | Status: DC

## 2021-04-06 MED ORDER — SODIUM CHLORIDE 0.9 % IV SOLN
2.0000 g | Freq: Once | INTRAVENOUS | Status: AC
  Administered 2021-04-06: 2 g via INTRAVENOUS
  Filled 2021-04-06: qty 2

## 2021-04-06 MED ORDER — ORAL CARE MOUTH RINSE: 15.0000 mL | Freq: Two times a day (BID) | OROMUCOSAL | Status: DC

## 2021-04-06 MED ORDER — CHLORHEXIDINE GLUCONATE 0.12 % MT SOLN
15.0000 mL | Freq: Two times a day (BID) | OROMUCOSAL | Status: DC
  Administered 2021-04-07 – 2021-04-12 (×9): 15 mL via OROMUCOSAL
  Filled 2021-04-06 (×9): qty 15

## 2021-04-06 MED ORDER — THIAMINE HCL 100 MG/ML IJ SOLN
100.0000 mg | Freq: Every day | INTRAMUSCULAR | Status: DC
  Administered 2021-04-06: 100 mg via INTRAVENOUS
  Filled 2021-04-06: qty 2

## 2021-04-06 MED ORDER — SODIUM CHLORIDE 0.9 % IV SOLN
1.0000 g | Freq: Three times a day (TID) | INTRAVENOUS | Status: DC
  Administered 2021-04-06 – 2021-04-07 (×3): 1 g via INTRAVENOUS
  Filled 2021-04-06 (×5): qty 1

## 2021-04-06 MED ORDER — ADULT MULTIVITAMIN W/MINERALS CH
1.0000 | ORAL_TABLET | Freq: Every day | ORAL | Status: DC
  Administered 2021-04-06 – 2021-04-12 (×7): 1
  Filled 2021-04-06 (×7): qty 1

## 2021-04-06 MED ORDER — FENTANYL CITRATE (PF) 100 MCG/2ML IJ SOLN
50.0000 ug | INTRAMUSCULAR | Status: DC | PRN
  Administered 2021-04-06: 50 ug via INTRAVENOUS
  Filled 2021-04-06: qty 2

## 2021-04-06 MED ORDER — HYDROMORPHONE HCL 2 MG PO TABS
1.0000 mg | ORAL_TABLET | ORAL | Status: DC | PRN
  Administered 2021-04-06 (×4): 2 mg
  Administered 2021-04-06: 1 mg
  Administered 2021-04-06 – 2021-04-07 (×5): 2 mg
  Filled 2021-04-06 (×10): qty 1

## 2021-04-06 MED ORDER — FENTANYL CITRATE (PF) 100 MCG/2ML IJ SOLN
50.0000 ug | Freq: Once | INTRAMUSCULAR | Status: AC
  Administered 2021-04-06: 50 ug via INTRAVENOUS
  Filled 2021-04-06: qty 2

## 2021-04-06 MED ORDER — SODIUM CHLORIDE 0.9 % IV SOLN: INTRAVENOUS | Status: DC

## 2021-04-06 MED ORDER — OSMOLITE 1.5 CAL PO LIQD
237.0000 mL | Freq: Every day | ORAL | Status: DC
  Administered 2021-04-06 – 2021-04-07 (×6): 237 mL

## 2021-04-06 MED ORDER — METHYLPREDNISOLONE SODIUM SUCC 125 MG IJ SOLR
125.0000 mg | Freq: Once | INTRAMUSCULAR | Status: AC
  Administered 2021-04-06: 125 mg via INTRAVENOUS
  Filled 2021-04-06: qty 2

## 2021-04-06 MED ORDER — ORAL CARE MOUTH RINSE
15.0000 mL | Freq: Two times a day (BID) | OROMUCOSAL | Status: DC
  Administered 2021-04-06 – 2021-04-12 (×12): 15 mL via OROMUCOSAL

## 2021-04-06 MED ORDER — METHYLPREDNISOLONE SODIUM SUCC 40 MG IJ SOLR
40.0000 mg | Freq: Two times a day (BID) | INTRAMUSCULAR | Status: DC
  Administered 2021-04-06 – 2021-04-07 (×3): 40 mg via INTRAVENOUS
  Filled 2021-04-06 (×3): qty 1

## 2021-04-06 MED ORDER — POTASSIUM CHLORIDE 20 MEQ PO PACK
40.0000 meq | PACK | ORAL | Status: AC
  Administered 2021-04-06 (×2): 40 meq
  Filled 2021-04-06 (×2): qty 2

## 2021-04-06 MED ORDER — FOLIC ACID 1 MG PO TABS
1.0000 mg | ORAL_TABLET | Freq: Every day | ORAL | Status: DC
  Administered 2021-04-07 – 2021-04-12 (×6): 1 mg
  Filled 2021-04-06 (×6): qty 1

## 2021-04-06 MED ORDER — SODIUM CHLORIDE 0.9 % IV BOLUS
1000.0000 mL | Freq: Once | INTRAVENOUS | Status: AC
  Administered 2021-04-06: 1000 mL via INTRAVENOUS

## 2021-04-06 MED ORDER — THIAMINE HCL 100 MG PO TABS
100.0000 mg | ORAL_TABLET | Freq: Every day | ORAL | Status: DC
  Administered 2021-04-07 – 2021-04-12 (×6): 100 mg
  Filled 2021-04-06 (×6): qty 1

## 2021-04-06 MED ORDER — POTASSIUM CHLORIDE 10 MEQ/100ML IV SOLN: 10.0000 meq | INTRAVENOUS | Status: DC

## 2021-04-06 MED ORDER — SODIUM CHLORIDE 0.9% FLUSH
10.0000 mL | INTRAVENOUS | Status: DC | PRN
  Administered 2021-04-08: 10 mL

## 2021-04-06 MED ORDER — SODIUM CHLORIDE 0.9 % IV SOLN
1.0000 mg | Freq: Every day | INTRAVENOUS | Status: DC
  Administered 2021-04-06: 1 mg via INTRAVENOUS
  Filled 2021-04-06: qty 0.2

## 2021-04-06 MED ORDER — LORAZEPAM 2 MG/ML IJ SOLN
0.5000 mg | Freq: Once | INTRAMUSCULAR | Status: AC
  Administered 2021-04-06: 0.5 mg via INTRAVENOUS

## 2021-04-06 MED ORDER — PROSOURCE TF PO LIQD
45.0000 mL | Freq: Every day | ORAL | Status: DC
  Administered 2021-04-06 – 2021-04-12 (×7): 45 mL
  Filled 2021-04-06 (×7): qty 45

## 2021-04-06 MED ORDER — MAGNESIUM SULFATE 2 GM/50ML IV SOLN
2.0000 g | Freq: Once | INTRAVENOUS | Status: AC
  Administered 2021-04-06: 2 g via INTRAVENOUS
  Filled 2021-04-06: qty 50

## 2021-04-06 MED ORDER — STERILE WATER FOR INJECTION IJ SOLN
INTRAMUSCULAR | Status: AC
  Filled 2021-04-06: qty 20

## 2021-04-06 MED ORDER — SODIUM CHLORIDE 0.9% FLUSH
10.0000 mL | Freq: Two times a day (BID) | INTRAVENOUS | Status: DC
  Administered 2021-04-06 – 2021-04-12 (×13): 10 mL

## 2021-04-06 MED ORDER — KETAMINE HCL 10 MG/ML IJ SOLN
INTRAMUSCULAR | Status: AC
  Filled 2021-04-06: qty 1

## 2021-04-06 MED ORDER — FREE WATER
100.0000 mL | Freq: Every day | Status: DC
  Administered 2021-04-06 – 2021-04-07 (×4): 100 mL

## 2021-04-06 MED ORDER — FOLIC ACID 5 MG/ML IJ SOLN: 1.0000 mg | Freq: Every day | INTRAMUSCULAR | Status: DC

## 2021-04-06 MED ORDER — CHLORHEXIDINE GLUCONATE CLOTH 2 % EX PADS
6.0000 | MEDICATED_PAD | Freq: Every day | CUTANEOUS | Status: DC
  Administered 2021-04-06 – 2021-04-12 (×8): 6 via TOPICAL

## 2021-04-06 MED ORDER — LORAZEPAM 2 MG/ML IJ SOLN
1.0000 mg | INTRAMUSCULAR | Status: DC | PRN
Start: 2021-04-06 — End: 2021-04-12
  Administered 2021-04-07 – 2021-04-09 (×3): 1 mg via INTRAVENOUS
  Filled 2021-04-06 (×3): qty 1

## 2021-04-06 MED ORDER — DEXTROSE 50 % IV SOLN
INTRAVENOUS | Status: AC
  Administered 2021-04-06: 12.5 g via INTRAVENOUS
  Filled 2021-04-06: qty 50

## 2021-04-06 MED ORDER — METHYLPREDNISOLONE SODIUM SUCC 40 MG IJ SOLR: 40.0000 mg | Freq: Two times a day (BID) | INTRAMUSCULAR | Status: DC

## 2021-04-06 MED ORDER — CARBAMAZEPINE 100 MG/5ML PO SUSP
400.0000 mg | Freq: Two times a day (BID) | ORAL | Status: DC
  Administered 2021-04-06 – 2021-04-12 (×12): 400 mg
  Filled 2021-04-06 (×15): qty 20

## 2021-04-06 MED ORDER — HEPARIN SODIUM (PORCINE) 5000 UNIT/ML IJ SOLN
5000.0000 [IU] | Freq: Three times a day (TID) | INTRAMUSCULAR | Status: DC
  Administered 2021-04-06 – 2021-04-12 (×18): 5000 [IU] via SUBCUTANEOUS
  Filled 2021-04-06 (×17): qty 1

## 2021-04-06 NOTE — Progress Notes (Signed)
Date and time results received: 04/06/21 0800  (use smartphrase ".now" to insert current time)  Test: trop  Critical Value: 7169  Name of Provider Notified: steve minor  Orders Received? Or Actions Taken? Recheck

## 2021-04-06 NOTE — Progress Notes (Signed)
Pharmacy Antibiotic Note  [name] is a 45 y.o. male admitted on 04/06/2021 with pneumonia.  Pharmacy has been consulted for Cefepime dosing.  Plan: Start Cefepime 1g IV q8h - assess renal function, urine output, and overall clinical status. Consider decreasing to Cefepime  1g IV q12h if UOP doesn't increase throughout the day, CrCl was estimated at 84ml/min using Cr of 1mg /dL.   Height: 5\' 7"  (170.2 cm) Weight: 43 kg (94 lb 12.8 oz) IBW/kg (Calculated) : 66.1  Temp (24hrs), Avg:97.2 F (36.2 C), Min:96.6 F (35.9 C), Max:97.9 F (36.6 C)  Recent Labs  Lab 04/06/21 0233 04/06/21 0403  WBC 23.2*  --   CREATININE <0.30*  --   LATICACIDVEN 7.0* 4.5*    CrCl cannot be calculated (This lab value cannot be used to calculate CrCl because it is not a number: <0.30).    No Known Allergies  Antimicrobials this admission: 6/9 Cefepime >>   Microbiology results: 6/9 BCx: pending 6/9 MRSA PCR: pending  Thank you for allowing pharmacy to be a part of this patient's care.  Joetta Manners, PharmD, [facility] Clinical Pharmacist  Please check AMION for all [place] phone numbers After 10:00 PM, call [place] [phone]

## 2021-04-06 NOTE — Progress Notes (Signed)
Initial Nutrition Assessment  DOCUMENTATION CODES:   Severe malnutrition in context of chronic illness, Underweight  INTERVENTION:   Initiate bolus tube feeding regimen via PEG: - 1 carton (237 ml) Osmolite 1.5 cal formula 5 x daily - ProSource 45 ml daily - Free water flushes of 50 ml before and after each bolus  Tube feeding regimen provides 1815 kcal, 85 grams of protein, and 905 ml of H2O.   Total free water with flushes: 1405 ml  - When IVF are discontinued, will consider increasing free water flushes to better meet pt's needs  - Add MVI with minerals daily per tube  NUTRITION DIAGNOSIS:   Severe Malnutrition related to chronic illness (epiglottis cancer s/p chemotherapy and radiation, EtOH abus) as evidenced by severe muscle depletion, severe fat depletion, percent weight loss (14.5% weight loss in less than 7 months).  GOAL:   Patient will meet greater than or equal to 90% of their needs  MONITOR:   Diet advancement, Labs, Weight trends, TF tolerance, Skin, I & O's  REASON FOR ASSESSMENT:   Consult Enteral/tube feeding initiation and management  ASSESSMENT:   45 year old male who presented to the ED on 6/09 with SOB. 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 Pt is also s/p PEG placement. Pt continues to smoke and drink alcohol. A right chest tube was placed for right pneumothorax.  Consult received for tube feeding initiation and management. Discussed pt with RN and during ICU rounds; pt continues to consume liquids at home including beer (about 18 per day). Per RN, pt awaiting SLP evaluation. Pt consuming ice chips at this time.  RD reviewed notes from pt's visits with [place] RD. Pt's goal tube feeding regimen is 5 cartons of Osmolite 1.5 daily via PEG. Per most recent RD note on 03/13/21, pt had increased his tube feeds from 3 cartons to 4 cartons of Osmolite 1.5  daily and was administering free water flushes before and after bolus feeds but was unsure of how much.  Spoke with pt at bedside. Pt states that he was administering his bolus tube feeds at home but only administering 3 cartons of Osmolite 1.5 daily. Pt states that his UBW prior to any weight loss was 130 lbs. Overall, pt is a poor historian. He states he was giving free water via tube but unsure how much. He also states that he was taking water by mouth but unsure how much. Pt was focused on requesting water and ice chips at time of RD visit and unable to provide much additional history due to fixation on need for water.  Reviewed weight history in chart. Pt with a 7.3 kg weight loss since encounter on 09/12/20. This is a 14.5% weight loss in less than 7 months which is severe and significant for timeframe. Based on weight loss and NFPE, pt meets criteria for severe malnutrition.  Medications reviewed and include: IV solu-medrol, thiamine, IV abx, folic acid NS: 75 ml/hr  Labs reviewed: sodium 109, potassium 3.2, lactic acid 4.5 CBG's: 65-125  NUTRITION - FOCUSED PHYSICAL EXAM:  Flowsheet Row Most Recent Value  Orbital Region Severe depletion  Upper Arm Region Severe depletion  Thoracic and Lumbar Region Severe depletion  Buccal Region Severe depletion  Temple Region Severe depletion  Clavicle Bone Region Severe depletion  Clavicle and Acromion Bone Region Severe depletion  Scapular Bone Region Severe depletion  Dorsal Hand Severe depletion  Patellar Region Severe depletion  Anterior Thigh Region Severe depletion  Posterior Calf Region Severe depletion  Edema (RD Assessment) None  Hair Reviewed  Eyes Reviewed  Mouth Reviewed  Skin Reviewed  Nails Reviewed       Diet Order:   Diet Order     None       EDUCATION NEEDS:   Not appropriate for education at this time  Skin:  Skin Assessment: Skin Integrity Issues: Stage I: coccyx  Last BM:  no documented BM  Height:    Ht Readings from Last 1 Encounters:  04/06/21 5\' 7"  (1.702 m)    Weight:   Wt Readings from Last 1 Encounters:  04/06/21 43 kg    BMI:  Body mass index is 14.85 kg/m.  Estimated Nutritional Needs:   Kcal:  1600-1800  Protein:  75-90 grams  Fluid:  1.6-1.8 L    Gustavus Bryant, MS, RD, LDN Inpatient Clinical Dietitian Please see AMiON for contact information.

## 2021-04-06 NOTE — ED Triage Notes (Signed)
Pt arrives is severe resp distress from home. Pt states he started getting short of breath 2 days ago. Pt has audible rhonchi.

## 2021-04-06 NOTE — ED Provider Notes (Signed)
[facility] Emergency Department Provider Note MRN:  [id number]  Arrival date & time: 04/06/21     Chief Complaint   Shortness of breath History of Present Illness   [name] is a 45 y.o. year-old male with a history of COPD, head neck cancer presenting to the ED with chief complaint of shortness of breath.  Worsening shortness of breath over the past 3 to 4 days, much worse this evening, hypoxic in the waiting room and 74% on room air struggling to breathe.  Review of Systems  A complete 10 system review of systems was obtained and all systems are negative except as noted in the HPI and PMH.   Patient's Health History    Past Medical History:  Diagnosis Date   Back pain    Bradycardia 08/17/2011   Cancer ([place])    COPD (chronic obstructive pulmonary disease) (HCC)    ETOH abuse    GERD (gastroesophageal reflux disease)    Hypertension    Macrocytosis 08/17/2011   Pancreatitis    Pneumonia    Port-A-Cath in place 09/04/2020   SAH (subarachnoid hemorrhage) ([place])    Seizures ([place])    last one 6 mos ago    Past Surgical History:  Procedure Laterality Date   BACK SURGERY     DIRECT LARYNGOSCOPY N/A 02/20/2021   Procedure: DIRECT LARYNGOSCOPY WITH BIOPSY;  Surgeon: Leta Baptist, MD;  Location: [facility] OR;  Service: ENT;  Laterality: N/A;   ESOPHAGOGASTRODUODENOSCOPY (EGD) WITH PROPOFOL N/A 08/14/2017   Procedure: ESOPHAGOGASTRODUODENOSCOPY (EGD) WITH PROPOFOL;  Surgeon: Daneil Dolin, MD;  Location: [facility] ENDO SUITE;  Service: Endoscopy;  Laterality: N/A;  1:30pm   ESOPHAGOGASTRODUODENOSCOPY (EGD) WITH PROPOFOL N/A 03/13/2018   Procedure: ESOPHAGOGASTRODUODENOSCOPY (EGD) WITH PROPOFOL;  Surgeon: Daneil Dolin, MD;  Location: [facility] END[facility] SUITE;  Service: Endoscopy;  Laterality: N/A;  2:30pm   ESOPHAGOGASTRODUODENOSCOPY (EGD) WITH PROPOFOL N/A 08/22/2020   Procedure: ESOPHAGOGASTRODUODENOSCOPY (EGD) WITH PROPOFOL;  Surgeon: Aviva Signs, MD;  Location: [facility] ORS;   Service: General;  Laterality: N/A;   PEG PLACEMENT N/A 08/22/2020   Procedure: PERCUTANEOUS ENDOSCOPIC GASTROSTOMY (PEG) PLACEMENT;  Surgeon: Aviva Signs, MD;  Location: [facility] ORS;  Service: General;  Laterality: N/A;   PORTACATH PLACEMENT Left 08/22/2020   Procedure: INSERTION PORT-A-CATH;  Surgeon: Aviva Signs, MD;  Location: [facility] ORS;  Service: General;  Laterality: Left;   SEPTOPLASTY      Family History  Problem Relation Age of Onset   Coronary artery disease Mother        deceased age 35   Seizures Father    Alcohol abuse Father    Colon cancer Neg Hx     Social History   Socioeconomic History   Marital status: Single    Spouse name: Not on file   Number of children: Not on file   Years of education: Not on file   Highest education level: Not on file  Occupational History   Not on file  Tobacco Use   Smoking status: Every Day    Packs/day: 1.00    Years: 20.00    Pack years: 20.00    Types: Cigarettes   Smokeless tobacco: Never  Vaping Use   Vaping Use: Never used  Substance and Sexual Activity   Alcohol use: Yes    Comment: couple of beers daily   Drug use: Not Currently    Types: Marijuana   Sexual activity: Not Currently  Other Topics Concern   Not on file  Social  History Narrative   Not on file   Social Determinants of Health   Financial Resource Strain: Low Risk    Difficulty of Paying Living Expenses: Not very hard  Food Insecurity: No Food Insecurity   Worried About Running Out of Food in the Last Year: Never true   Ran Out of Food in the Last Year: Never true  Transportation Needs: No Transportation Needs   Lack of Transportation (Medical): No   Lack of Transportation (Non-Medical): No  Physical Activity: Sufficiently Active   Days of Exercise per Week: 7 days   Minutes of Exercise per Session: 60 min  Stress: Stress Concern Present   Feeling of Stress : To some extent  Social Connections: Moderately Isolated   Frequency of Communication  with Friends and Family: More than three times a week   Frequency of Social Gatherings with Friends and Family: More than three times a week   Attends Religious Services: Never   Marine scientist or Organizations: No   Attends Music therapist: Never   Marital Status: Living with partner  Intimate Partner Violence: Not At Risk   Fear of Current or Ex-Partner: No   Emotionally Abused: No   Physically Abused: No   Sexually Abused: No     Physical Exam   Vitals:   04/06/21 0510 04/06/21 0515  BP: 96/61 91/67  Pulse: (!) 101 (!) 103  Resp: (!) 33 (!) 30  Temp:    SpO2: 100% 100%    CONSTITUTIONAL: Ill-appearing, cachectic, pale, in moderate respiratory distress NEURO:  Alert and oriented x 3, no focal deficits EYES:  eyes equal and reactive ENT/NECK:  no LAD, no JVD CARDIO: Tachycardic rate, well-perfused, normal S1 and S2 PULM: Diffuse rhonchi GI/GU:  normal bowel sounds, non-distended, non-tender MSK/SPINE:  No gross deformities, no edema SKIN:  no rash, atraumatic PSYCH:  Appropriate speech and behavior  *Additional and/or pertinent findings included in MDM below  Diagnostic and Interventional Summary    EKG Interpretation  Date/Time:  Thursday April 06 2021 02:02:32 EDT Ventricular Rate:  119 PR Interval:  143 QRS Duration: 107 QT Interval:  345 QTC Calculation: 484 R Axis:   101 Text Interpretation: Sinus tachycardia Right atrial enlargement Anterior infarct, old Abnormal T, consider ischemia, diffuse leads Confirmed by Gerlene Fee [phone]) on 04/06/2021 2:34:34 AM        Labs Reviewed  CBC - Abnormal; Notable for the following components:      Result Value   WBC 23.2 (*)    RBC 3.82 (*)    HCT 38.0 (*)    MCH 34.3 (*)    All other components within normal limits  COMPREHENSIVE METABOLIC PANEL - Abnormal; Notable for the following components:   Sodium 109 (*)    Potassium 3.2 (*)    Chloride 69 (*)    Glucose, Bld 213 (*)    BUN <5  (*)    Creatinine, Ser <0.30 (*)    Calcium 8.0 (*)    Albumin 3.0 (*)    Alkaline Phosphatase 129 (*)    Anion gap 16 (*)    All other components within normal limits  LACTIC ACID, PLASMA - Abnormal; Notable for the following components:   Lactic Acid, Venous 7.0 (*)    All other components within normal limits  BRAIN NATRIURETIC PEPTIDE - Abnormal; Notable for the following components:   B Natriuretic Peptide 624.0 (*)    All other components within normal limits  PROTIME-INR - Abnormal;  Notable for the following components:   Prothrombin Time 15.4 (*)    All other components within normal limits  BLOOD GAS, VENOUS - Abnormal; Notable for the following components:   pO2, Ven <31.0 (*)    All other components within normal limits  TROPONIN I (HIGH SENSITIVITY) - Abnormal; Notable for the following components:   Troponin I (High Sensitivity) 1,317 (*)    All other components within normal limits  RESP PANEL BY RT-PCR (FLU A&B, COVID) ARPGX2  CULTURE, BLOOD (SINGLE)  APTT  LACTIC ACID, PLASMA  TYPE AND SCREEN  TROPONIN I (HIGH SENSITIVITY)    DG Chest Port 1 View  Final Result    DG Chest Portable 1 View  Final Result    DG Chest Portable 1 View  Final Result      Medications  ipratropium-albuterol (DUONEB) 0.5-2.5 (3) MG/3ML nebulizer solution 3 mL (has no administration in time range)  ipratropium-albuterol (DUONEB) 0.5-2.5 (3) MG/3ML nebulizer solution 3 mL (has no administration in time range)  ipratropium-albuterol (DUONEB) 0.5-2.5 (3) MG/3ML nebulizer solution 3 mL (has no administration in time range)  sterile water (preservative free) injection (has no administration in time range)  methylPREDNISolone sodium succinate (SOLU-MEDROL) 125 mg/2 mL injection 125 mg (125 mg Intravenous Given 04/06/21 0324)  LORazepam (ATIVAN) injection 0.5 mg (0.5 mg Intravenous Given 04/06/21 0211)  ceFEPIme (MAXIPIME) 2 g in sodium chloride 0.9 % 100 mL IVPB (0 g Intravenous Stopped  04/06/21 0355)  sodium chloride 0.9 % bolus 1,000 mL (1,000 mLs Intravenous New Bag/Given 04/06/21 0323)  fentaNYL (SUBLIMAZE) injection 50 mcg (50 mcg Intravenous Given 04/06/21 0435)     Procedures  /  Critical Care .Critical Care  Date/Time: 04/06/2021 2:34 AM Performed by: Maudie Flakes, MD Authorized by: Maudie Flakes, MD   Critical care provider statement:    Critical care time (minutes):  45   Critical care was necessary to treat or prevent imminent or life-threatening deterioration of the following conditions:  Respiratory failure   Critical care was time spent personally by me on the following activities:  Discussions with consultants, evaluation of patient's response to treatment, examination of patient, ordering and performing treatments and interventions, ordering and review of laboratory studies, ordering and review of radiographic studies, pulse oximetry, re-evaluation of patient's condition, obtaining history from patient or surrogate and review of old charts CHEST TUBE INSERTION  Date/Time: 04/06/2021 4:04 AM Performed by: Maudie Flakes, MD Authorized by: Maudie Flakes, MD   Consent:    Consent obtained:  Emergent situation Universal protocol:    Test results available: yes     Imaging studies available: yes     Site/side marked: yes     Immediately prior to procedure, a time out was called: yes     Patient identity confirmed:  Anonymous protocol, patient vented/unresponsive Pre-procedure details:    Skin preparation:  Chlorhexidine   Preparation: Patient was prepped and draped in the usual sterile fashion   Sedation:    Sedation type:  Anxiolysis Anesthesia:    Anesthesia method:  Local infiltration   Local anesthetic:  Lidocaine 1% w/o epi Procedure details:    Placement location:  R lateral   Scalpel size:  10   Tube size (Fr):  Minicatheter (pigtail)   Ultrasound guidance: no     Tension pneumothorax: yes     Tube connected to:  Suction   Drainage  characteristics:  Air only   Suture material:  2-0 silk   Dressing:  4x4 sterile gauze Post-procedure details:    Post-insertion x-ray findings: tube in good position     Procedure completion:  Tolerated well, no immediate complications  ED Course and Medical Decision Making  I have reviewed the triage vital signs, the nursing notes, and pertinent available records from the EMR.  Listed above are laboratory and imaging tests that I personally ordered, reviewed, and interpreted and then considered in my medical decision making (see below for details).  Concern for COPD exacerbation versus pneumonia, arriving in hypoxic respiratory failure with increased effort, started on BiPAP.  Requiring small dose of Ativan to tolerate the BiPAP, monitoring the situation closely.     Chest x-ray reveals pneumothorax, and I spoke with radiology who called with results, some evidence of tension.  Chest tube placed as described above.  Prior to insertion, patient hypoxic with some soft blood pressures in the 70s to 80s.  When suction applied to the inserted pigtail catheter, pressures improved and now with oxygen saturations of 100% on nonrebreather.  Patient has known cancer of the epiglottis and would therefore be an extremely risky intubation.  Seems to be improving with the pigtail, will try to avoid intubation if possible.  Currently protecting airway.  Will admit to intensivist.   Accepted for admission and transfer to [facility] intensivist service by Dr. Ina Homes.   Barth Kirks. [place] Small, [place] [email or web address]  Final Clinical Impressions(s) / ED Diagnoses     ICD-10-CM   1. Acute respiratory failure with hypoxia (HCC)  J96.01     2. Tension pneumothorax  J93.0     3. Hyponatremia  E87.1       ED Discharge Orders     None        Discharge Instructions Discussed with and Provided to Patient:   Discharge Instructions   None        Maudie Flakes, MD 04/06/21 [phone]

## 2021-04-06 NOTE — TOC Initial Note (Signed)
Transition of Care [facility]) - Initial/Assessment Note    Patient Details  Name: [name] MRN: [id number] Date of Birth: 21-Apr-1976  [facility] CM/SW Contact:    Verdell Carmine, RN Phone Number: 04/06/2021, 1:23 PM  Clinical Narrative:                 Patient admitted to ICU with tension pneumoothorax, history of COPD, pneumonia may be necrotizing. Chest tube placed in ED. Patient drinks alchohol at home, will need SA cessation counseling. May need oxygen at home currently still has remnant of pneumothorax. May need [place] post discharge. CM will follow for needs.   Expected Discharge Plan: [place] Barriers to Discharge: Continued Medical Work up   Patient Goals and CMS Choice        Expected Discharge Plan and Services Expected Discharge Plan: [place]   Discharge Planning Services: CM Consult   Living arrangements for the past 2 months: Single Family Home                                      Prior Living Arrangements/Services Living arrangements for the past 2 months: Single Family Home Lives with:: Significant Other Patient language and need for interpreter reviewed:: Yes        Need for Family Participation in Patient Care: Yes (Comment) Care giver support system in place?: Yes (comment)   Criminal Activity/Legal Involvement Pertinent to Current Situation/Hospitalization: No - Comment as needed  Activities of Daily Living      Permission Sought/Granted                  Emotional Assessment Appearance:: Appears older than stated age     Orientation: : Oriented to Situation, Oriented to  Time, Oriented to Place, Oriented to Self Alcohol / Substance Use: Tobacco Use, Alcohol Use Psych Involvement: No (comment)  Admission diagnosis:  Respiratory failure ([place]) [J96.90] Tension pneumothorax [J93.0] Hyponatremia [E87.1] Acute respiratory failure with hypoxia ([place]) [J96.01] Patient Active  Problem List   Diagnosis Date Noted   Respiratory failure ([place]) 04/06/2021   Pressure injury of skin 04/06/2021   Port-A-Cath in place 09/04/2020   Squamous cell carcinoma of epiglottis (HCC)    Abdominal pain, epigastric 12/31/2017   Gastric ulcer 08/12/2017   ETOH abuse    Atypical chest pain    Hypercalcemia 10/08/2016   Hypokalemia 10/08/2016   Diarrhea 10/09/2015   Pancreatitis, alcoholic, acute    Alcohol-induced chronic pancreatitis ([place])    Acute pancreatitis    Pancreatitis 12/26/2014   Pancreatic pseudocyst 12/26/2014   Hyponatremia 12/26/2014   HTN (hypertension) 07/27/2013   Abdominal pain 07/11/2012   Macrocytosis 08/17/2011   Marijuana abuse 08/17/2011   Bradycardia 08/17/2011   Alcohol withdrawal ([place]) 08/17/2011   Elevated blood pressure 07/08/2011   Left against medical advice 35/46/5681   Acute alcoholic pancreatitis 27/51/7001   Alcohol abuse, continuous 07/07/2011   Tobacco abuse 07/07/2011   Seizure disorder ([place]) 07/07/2011   Adynamic ileus ([place]) 07/07/2011   Wheezing 07/07/2011   COPD (chronic obstructive pulmonary disease) ([place]) 07/07/2011   PCP:  Jani Gravel, MD Pharmacy:   [facility] [place] [facility] [address] [place] [address] Phone: [phone] Fax: [phone]     Social Determinants of Health (SDOH) Interventions    Readmission Risk Interventions Readmission Risk Prevention Plan  10/18/2020  Transportation Screening Complete  Home Care Screening Complete  Medication Review (RN CM) Complete  Some recent data might be hidden

## 2021-04-06 NOTE — ED Notes (Signed)
CRITICAL VALUES REPORTED TO EDP BERO----- SEE ORDERS TROPONIN 1317 PO2 <31 LACTIC ACID 7.0

## 2021-04-06 NOTE — Progress Notes (Signed)
Dr Vaughan Browner, CCM notified of positive strept pnmo, urine

## 2021-04-06 NOTE — Progress Notes (Signed)
eLink Physician-Brief Progress Note Patient Name: [name] DOB: 1976-04-11 MRN: [id number]   Date of Service  04/06/2021  HPI/Events of Note  Patient admitted through the ED with severe shortness of breath / acute respiratory failure secondary to bilateral necrotizing pneumonia and a large right tension pneumothorax. He is  s/p chest tube insertion.  eICU Interventions  New Patient Evaluation.        Kerry Kass Shakedra Beam 04/06/2021, 7:08 AM

## 2021-04-06 NOTE — Progress Notes (Signed)
Hypoglycemic Event  CBG: 65  Treatment: D50 25 mL (12.5 gm)  Symptoms: None  Follow-up CBG: Time: 0703 CBG Result:124  Possible Reasons for Event: Unknown     [place], Garnette Scheuermann

## 2021-04-06 NOTE — Progress Notes (Signed)
Date and time results received: 04/06/21  0900 (use smartphrase ".now" to insert current time)  Test: trop, Na, LA Critical Value:  trop 1316, Na 111, LA 3.2  Name of Provider Notified: Richardson Landry Minor  Orders Received? Or Actions Taken?: cont to monitor

## 2021-04-06 NOTE — H&P (Addendum)
NAME:  [name], MRN:  [id number], DOB:  [birth year], LOS: 0 ADMISSION DATE:  04/06/2021, CONSULTATION DATE: 04/06/2021 REFERRING MD: [facility] ED, CHIEF COMPLAINT: Hypoxia, elevated lactic acid, elevated troponins.  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  He underwent radiation and chemotherapy pleated in January 2022.  PET scan revealed hypermetabolic state of the epiglottis.  On 02/20/2021 he had biopsy by Dr. Benjamine Mola did not reveal any cancerous cells.  Of note he has continued to smoke, drink alcohol, and now presents to [facility] 04/06/2021 with 3 days of increasing shortness of breath.  In the emergency department sats are noted to be 74% chest x-ray revealed right tension pneumothorax and a right pigtail chest tube catheter was placed by the [facility] emergency department physician.  He was noted to have elevated lactic acid of 7, elevated white count of 22 chest x-ray with possible multifocal pneumonia.  Therefore he is treated as pneumonia chest tube placed supplemental oxygen of 60% on nonrebreather and transferred to [facility] for further evaluation and treatment.  Sodium was noted to be 109 suspect this is secondary to his alcohol consumption.  He will be further evaluated and treated at [facility].  When questioned he wants to be a full code  Pertinent  Medical History   Past Medical History:  Diagnosis Date   Back pain    Bradycardia 08/17/2011   Cancer ([place])    COPD (chronic obstructive pulmonary disease) ([place])    ETOH abuse    GERD (gastroesophageal reflux disease)    Hypertension    Macrocytosis 08/17/2011   Pancreatitis    Pneumonia    Port-A-Cath in place 09/04/2020   SAH (subarachnoid hemorrhage) ([place])    Seizures ([place])    last one 6 mos ago     Significant Hospital Events: Including procedures, antibiotic start and stop dates in addition to other  pertinent events   04/06/2021 right chest tube placed at [facility] for tension pneumothorax right  Interim History / Subjective:  Transferred from [facility] awake and alert complaining of pain right chest and abdomen Objective   Blood pressure 106/66, pulse (!) 103, temperature 97.9 F (36.6 C), temperature source Oral, resp. rate (!) 23, height 5\' 7"  (1.702 m), weight 43 kg, SpO2 98 %.    Vent Mode: BIPAP FiO2 (%):  [100 %] 100 % Set Rate:  [15 bmp] 15 bmp PEEP:  [5 cmH20] 5 cmH20   Intake/Output Summary (Last 24 hours) at 04/06/2021 0752 Last data filed at 04/06/2021 0355 Gross per 24 hour  Intake 100 ml  Output --  Net 100 ml   Filed Weights   04/06/21 0240 04/06/21 0630  Weight: 44.5 kg 43 kg    Examination: General: Emaciated male who is able to communicate HEENT: Edentulous, hoarse voice, no JVD or lymphadenopathy is appreciated Neuro: Grossly intact without focal defect CV: Heart sounds are regular regular rate and rhythm PULM: Coarse rhonchi bilaterally right pigtail chest tube in place with noted air leak.  Currently on 60% nonrebreather with sats of 94% GI: Tender to touch, positive bowel sounds, PEG tube in place without any major issues GU: Voids Extremities: warm/dry, negative edema  Skin: no rashes or lesions   Labs/imaging that I havepersonally reviewed  (right click and "Reselect all SmartList Selections" daily)  All labs  reviewed   Resolved Hospital Problem list  Assessment & Plan:  Acute on chronic hypoxic respiratory failure most likely multifactorial in the setting of recent head neck cancer, severe COPD with continued tobacco abuse, reported alcohol abuse despite having PEG in place since 08/22/2020.  Complicated by spontaneous tension right pneumothorax on 04/06/2021 was transferred to [facility] for further evaluation and treatment. Pna Right chest tube to 20 cm suction with airleak noted Supplemental oxygen as needed  currently on 60% nonrebreather Serial chest x-ray If necessary per patient's statement Note due to his epiglottic squamous cell cancer radiation chemo treatment he would be a difficult intubation as noted by ENT evaluation Bronchodilators as needed steroids secondary to stridor questionable acute exacerbation of COPD Empirical antimicrobial therapy   Hyponatremia 109 on admission with last of sodium on 02/20/2021 of 125. Maybe beer proteinemia Recent Labs  Lab 04/06/21 0233  NA 109*   Monitor sodium Continues to consume alcohol unknown amount Repeat sodium now and then decide on how to resolve at the appropriate rate  Squamous cell head neck cancer status post chemo/radiation therapy completed 10/30/2020.  Further biopsy 02/20/2021 by Dr. Benjamine Mola with no malignant cells identified. Continue to monitor  Elevated cardiac enzyme noted to be 1439 most likely stress Continue to monitor Questionable cardiology consult   Monitor for etoh withdrawal Thiame/folic acid     Patient and significant other updated 04/06/2021   Best practice (right click and "Reselect all SmartList Selections" daily)  Diet:  Tube Feed  Pain/Anxiety/Delirium protocol (if indicated): No VAP protocol (if indicated): Yes DVT prophylaxis: Subcutaneous Heparin GI prophylaxis: PPI Glucose control:  SSI No Central venous access:  Yes, and it is still needed Arterial line:  Yes, and it is still needed Foley:  Yes, and it is still needed Mobility:  bed rest  PT consulted: N/A Last date of multidisciplinary goals of care discussion [TBD] Code Status:  full code Disposition: ICU  Labs   CBC: Recent Labs  Lab 04/06/21 0233  WBC 23.2*  HGB 13.1  HCT 38.0*  MCV 99.5  PLT 242    Basic Metabolic Panel: Recent Labs  Lab 04/06/21 0233  NA 109*  K 3.2*  CL 69*  CO2 24  GLUCOSE 213*  BUN <5*  CREATININE <0.30*  CALCIUM 8.0*   GFR: CrCl cannot be calculated (This lab value cannot be used to calculate  CrCl because it is not a number: <0.30). Recent Labs  Lab 04/06/21 0233 04/06/21 0403  WBC 23.2*  --   LATICACIDVEN 7.0* 4.5*    Liver Function Tests: Recent Labs  Lab 04/06/21 0233  AST 35  ALT 14  ALKPHOS 129*  BILITOT 0.6  PROT 7.1  ALBUMIN 3.0*   No results for input(s): LIPASE, AMYLASE in the last 168 hours. No results for input(s): AMMONIA in the last 168 hours.  ABG    Component Value Date/Time   PHART 7.451 (H) 04/10/2007 0916   PCO2ART 32.8 (L) 04/10/2007 0916   PO2ART 58.0 (L) 04/10/2007 0916   HCO3 21.9 04/06/2021 0246   TCO2 16 10/01/2016 0021   ACIDBASEDEF 0.4 04/07/2007 0400   O2SAT 31.8 04/06/2021 0246     Coagulation Profile: Recent Labs  Lab 04/06/21 0233  INR 1.2    Cardiac Enzymes: No results for input(s): CKTOTAL, CKMB, CKMBINDEX, TROPONINI in the last 168 hours.  HbA1C: No results found for: HGBA1C  CBG: Recent Labs  Lab 04/06/21 0632 04/06/21 0703 04/06/21 0730  GLUCAP 65* 124* 125*    Review of Systems:   10  point review of system taken, please see HPI for positives and negatives.   Past Medical History:  He,  has a past medical history of Back pain, Bradycardia (08/17/2011), Cancer ([place]), COPD (chronic obstructive pulmonary disease) ([place]), ETOH abuse, GERD (gastroesophageal reflux disease), Hypertension, Macrocytosis (08/17/2011), Pancreatitis, Pneumonia, Port-A-Cath in place (09/04/2020), SAH (subarachnoid hemorrhage) ([place]), and Seizures ([place]).   Surgical History:   Past Surgical History:  Procedure Laterality Date   BACK SURGERY     DIRECT LARYNGOSCOPY N/A 02/20/2021   Procedure: DIRECT LARYNGOSCOPY WITH BIOPSY;  Surgeon: Leta Baptist, MD;  Location: [facility] OR;  Service: ENT;  Laterality: N/A;   ESOPHAGOGASTRODUODENOSCOPY (EGD) WITH PROPOFOL N/A 08/14/2017   Procedure: ESOPHAGOGASTRODUODENOSCOPY (EGD) WITH PROPOFOL;  Surgeon: Daneil Dolin, MD;  Location: [facility] ENDO SUITE;  Service: Endoscopy;  Laterality: N/A;  1:30pm    ESOPHAGOGASTRODUODENOSCOPY (EGD) WITH PROPOFOL N/A 03/13/2018   Procedure: ESOPHAGOGASTRODUODENOSCOPY (EGD) WITH PROPOFOL;  Surgeon: Daneil Dolin, MD;  Location: [facility] ENDO SUITE;  Service: Endoscopy;  Laterality: N/A;  2:30pm   ESOPHAGOGASTRODUODENOSCOPY (EGD) WITH PROPOFOL N/A 08/22/2020   Procedure: ESOPHAGOGASTRODUODENOSCOPY (EGD) WITH PROPOFOL;  Surgeon: Aviva Signs, MD;  Location: [facility] ORS;  Service: General;  Laterality: N/A;   PEG PLACEMENT N/A 08/22/2020   Procedure: PERCUTANEOUS ENDOSCOPIC GASTROSTOMY (PEG) PLACEMENT;  Surgeon: Aviva Signs, MD;  Location: [facility] ORS;  Service: General;  Laterality: N/A;   PORTACATH PLACEMENT Left 08/22/2020   Procedure: INSERTION PORT-A-CATH;  Surgeon: Aviva Signs, MD;  Location: [facility] ORS;  Service: General;  Laterality: Left;   SEPTOPLASTY       Social History:   reports that he has been smoking cigarettes. He has a 20.00 pack-year smoking history. He has never used smokeless tobacco. He reports current alcohol use. He reports previous drug use. Drug: Marijuana.   Family History:  His family history includes Alcohol abuse in his father; Coronary artery disease in his mother; Seizures in his father. There is no history of Colon cancer.   Allergies No Known Allergies   Home Medications  Prior to Admission medications   Medication Sig Start Date End Date Taking? Authorizing Provider  acetaminophen (TYLENOL) 325 MG tablet Take 2 tablets (650 mg total) by mouth every 6 (six) hours as needed for mild pain (or Fever >/= 101). 10/19/20   Roxan Hockey, MD  albuterol (PROVENTIL) (2.5 MG/3ML) 0.083% nebulizer solution Take 2.5 mg by nebulization every 6 (six) hours as needed for wheezing or shortness of breath.    [provider]  albuterol (VENTOLIN HFA) 108 (90 Base) MCG/ACT inhaler Inhale 2 puffs into the lungs every 6 (six) hours as needed for wheezing or shortness of breath. 10/19/20   Roxan Hockey, MD  amLODipine (NORVASC) 10 MG tablet  Take 10 mg by mouth daily.  06/22/20   [provider]  carbamazepine (TEGRETOL) 200 MG tablet Take 400 mg by mouth 2 (two) times daily. 11/16/20   [provider]  chlordiazePOXIDE (LIBRIUM) 25 MG capsule Take 1 capsule (25 mg total) by mouth every 6 (six) hours. 09/05/20   Derek Jack, MD  CISPLATIN IV Inject into the vein once a week. 09/05/20   [provider]  dexamethasone 0.5 MG/5ML elixir Take by mouth. 09/15/20   [provider]  diclofenac Sodium (VOLTAREN) 1 % GEL Apply 2 g topically daily as needed (pain).  07/28/20   [provider]  folic acid (FOLVITE) 1 MG tablet Take 1 tablet (1 mg total) by mouth daily. 10/20/20   Roxan Hockey, MD  gabapentin (NEURONTIN) 300 MG capsule Take 300 mg by mouth 3 (three) times daily. 07/21/20   [provider]  levETIRAcetam (KEPPRA) 500 MG tablet Take 2 tablets (1,000 mg total) by mouth 2 (two) times daily. 10/19/20   Roxan Hockey, MD  lidocaine (XYLOCAINE) 2 % solution Use as directed 5 mLs in the mouth or throat 4 (four) times daily as needed for mouth pain. Swish and swallow 11/20/20   Margarita Mail, PA-C  lidocaine-prilocaine (EMLA) cream Apply small amount to port a cath site and cover with plastic wrap 1 hour prior to chemotherapy appointments 09/04/20   Derek Jack, MD  LORazepam (ATIVAN) 1 MG tablet Take 1 tablet (1 mg total) by mouth daily as needed for up to 10 doses for anxiety (For air hunger). 11/20/20   Wyvonnia Dusky, MD  Multiple Vitamin (MULTIVITAMIN WITH MINERALS) TABS tablet Take 1 tablet by mouth daily. 10/20/20   Roxan Hockey, MD  Nutritional Supplements (FEEDING SUPPLEMENT, OSMOLITE 1.5 CAL,) LIQD Bolus 237 ml Osmolite 1.5 daily with 60 ml H20 before and after feeding via PEG Continuous Osmolite 1.5 -4 cartons (948 ml) from 8 pm to 8 am (12 hours/day) Start continuous feedings @ 30 ml/hr for 12 hours, increase by 10 ml every day to goal rate 80 ml/12 hours  120 ml free water flush before and after continuous feedings Meets 100% calorie needs 01/13/21   Derek Jack, MD  ondansetron (ZOFRAN) 4 MG tablet Take 1 tablet (4 mg total) by mouth every 6 (six) hours as needed for nausea. 10/19/20   Roxan Hockey, MD  Oxycodone HCl 10 MG TABS Take 1 tablet (10 mg total) by mouth every 12 (twelve) hours as needed. 12/15/20   Derek Jack, MD  pantoprazole (PROTONIX) 40 MG tablet Take 1 tablet (40 mg total) by mouth daily. 10/19/20   Emokpae, Courage, MD  phenol (CHLORASEPTIC) 1.4 % LIQD Use as directed 1 spray in the mouth or throat as needed for throat irritation / pain. 10/19/20   Roxan Hockey, MD  prochlorperazine (COMPAZINE) 10 MG tablet Take 1 tablet (10 mg total) by mouth every 6 (six) hours as needed for nausea or vomiting. 08/25/20   Derek Jack, MD  SSD 1 % cream Apply topically in the morning and at bedtime. 10/04/20   [provider]  thiamine 100 MG tablet Take 1 tablet (100 mg total) by mouth daily. 10/20/20   Roxan Hockey, MD  topiramate (TOPAMAX) 25 MG tablet Take 1 tablet (25 mg total) by mouth See admin instructions. Take 1 tablet (25 mg) twice daily for 1 week, then increase to 2 tablets (50 mg) twice daily 10/19/20 10/19/21  Roxan Hockey, MD     Critical care time: [place] Nurse Practitioner [place] Please consult Amion 04/06/2021, 7:54 AM

## 2021-04-06 NOTE — Evaluation (Signed)
Clinical/Bedside Swallow Evaluation Patient Details  Name: [name] MRN: [id number] Date of Birth: 06/21/1976  Today's Date: 04/06/2021 Time: SLP Start Time (ACUTE ONLY): 38 SLP Stop Time (ACUTE ONLY): 1340 SLP Time Calculation (min) (ACUTE ONLY): 21 min  Past Medical History:  Past Medical History:  Diagnosis Date   Back pain    Bradycardia 08/17/2011   Cancer ([facility])    COPD (chronic obstructive pulmonary disease) ([facility])    ETOH abuse    GERD (gastroesophageal reflux disease)    Hypertension    Macrocytosis 08/17/2011   Pancreatitis    Pneumonia    Port-A-Cath in place 09/04/2020   SAH (subarachnoid hemorrhage) ([place])    Seizures ([place])    last one 6 mos ago   Past Surgical History:  Past Surgical History:  Procedure Laterality Date   BACK SURGERY     DIRECT LARYNGOSCOPY N/A 02/20/2021   Procedure: DIRECT LARYNGOSCOPY WITH BIOPSY;  Surgeon: Leta Baptist, MD;  Location[facility] OR;  Service: ENT;  Laterality: N/A;   ESOPHAGOGASTRODUODENOSCOPY (EGD) WITH PROPOFOL N/A 08/14/2017   Procedure: ESOPHAGOGASTRODUODENOSCOPY (EGD) WITH PROPOFOL;  Surgeon: Daneil Dolin, MD;  Location[facility] ENDO [facility];  Service: Endoscopy;  Laterality: N/A;  1:30pm   ESOPHAGOGASTRODUODENOSCOPY (EGD) WITH PROPOFOL N/A 03/13/2018   Procedure: ESOPHAGOGASTRODUODENOSCOPY (EGD) WITH PROPOFOL;  Surgeon: Daneil Dolin, MD;  Location[facility];  Service: Endoscopy;  Laterality: N/A;  2:30pm   ESOPHAGOGASTRODUODENOSCOPY (EGD) WITH PROPOFOL N/A 08/22/2020   Procedure: ESOPHAGOGASTRODUODENOSCOPY (EGD) WITH PROPOFOL;  Surgeon: Aviva Signs, MD;  Location: [facility] ORS;  Service: General;  Laterality: N/A;   PEG PLACEMENT N/A 08/22/2020   Procedure: PERCUTANEOUS ENDOSCOPIC GASTROSTOMY (PEG) PLACEMENT;  Surgeon: Aviva Signs, MD;  Location: [facility] ORS;  Service: General;  Laterality: N/A;   PORTACATH PLACEMENT Left 08/22/2020   Procedure: INSERTION PORT-A-CATH;  Surgeon: Aviva Signs, MD;  Location: [facility] ORS;  Service:  General;  Laterality: Left;   SEPTOPLASTY     HPI:  45 year-old male who was 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  He underwent radiation and chemotherapy completed 10/2020. PEG placed 07/2020.  Per chart PET scan revealed hypermetabolic state of the epiglottis.,  02/20/2021 biopsy did not reveal any cancerous cells.  Pt presented to [facility] increasing shortness of breath. Chest x-ray with possible multifocal pneumonia. CXR revealed Large right tension pneumothorax. Multifocal pulmonary infiltrates, possibly with foci of cavitation  most in keeping with multifocal possibly necrotizing pneumonia. MBS 08/23/20 (prior to treatment) revealed penetration and aspiration after swallow without awareness with wet vocal quality and throat clearing, residue greater with nectar. Rec'd Dys 3, thin, cough post swallow, dysphagia treatment with swallow exercises.   Assessment / Plan / Recommendation Clinical Impression  Swallow assessment completed with cachetic pt who has history of radiation/chemo 07/2020 and PEG. He reports drinking water, sometimes soda and consuming chicken on occasion. Sister states pt coughs with each sip liquid prior to admission. He is endentulous without overt labial/lingual impairments. Efforts at cough were throat clears and voice is hoarse with decreased intensity. Palpation of neck was not appreciable for fibrotic tissue subjectively. There were indications of inadequate laryngeal closure including immediate cough, muliple swallows, wet quality of verbalizations lessened but present with nectar thick. Expectoration of mucous following reflexive cough. MBS to objectively measure function and integrity of swallow mechansim with MBS tomorrow. Until then he may have supervised sips water only, separate ice chips every few hours. SLP Visit Diagnosis: Dysphagia, unspecified (R13.10)    Aspiration  Risk  Severe aspiration risk    Diet Recommendation Ice chips PRN after  oral care;Other (Comment) (sips water)   Liquid Administration via: Cup Medication Administration: Via alternative means    Other  Recommendations Oral Care Recommendations: Oral care QID   Follow up Recommendations Other (comment) (TBD)      Frequency and Duration min 2x/week  2 weeks       Prognosis Prognosis for Safe Diet Advancement:  (fair-good) Barriers to Reach Goals:  (radiation tx)      Swallow Study   General Date of Onset: 04/06/21 HPI: 45 year-old male who was 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  He underwent radiation and chemotherapy completed 10/2020. PEG placed 07/2020.  Per chart PET scan revealed hypermetabolic state of the epiglottis.,  02/20/2021 biopsy did not reveal any cancerous cells.  Pt presented to [facility] increasing shortness of breath. Chest x-ray with possible multifocal pneumonia. CXR revealed Large right tension pneumothorax. Multifocal pulmonary infiltrates, possibly with foci of cavitation  most in keeping with multifocal possibly necrotizing pneumonia. MBS 08/23/20 (prior to treatment) revealed penetration and aspiration after swallow without awareness with wet vocal quality and throat clearing, residue greater with nectar. Rec'd Dys 3, thin, cough post swallow, dysphagia treatment with swallow exercises. Type of Study: Bedside Swallow Evaluation Previous Swallow Assessment:  (see HPI) Diet Prior to this Study: NPO Temperature Spikes Noted: No Respiratory Status: Non-rebreather History of Recent Intubation: No Behavior/Cognition: Alert;Cooperative;Requires cueing;Pleasant mood Oral Cavity Assessment: Within Functional Limits Oral Care Completed by SLP: Yes Oral Cavity - Dentition: Edentulous Vision: Functional for self-feeding Self-Feeding Abilities: Able to feed self Patient Positioning: Upright in bed Baseline Vocal Quality: Hoarse;Low vocal intensity Volitional Cough: Weak Volitional Swallow: Able to elicit     Oral/Motor/Sensory Function Overall Oral Motor/Sensory Function: Within functional limits   Ice Chips Ice chips: Impaired Presentation: Spoon Oral Phase Impairments:  (none) Oral Phase Functional Implications:  (none) Pharyngeal Phase Impairments: Throat Clearing - Delayed   Thin Liquid Thin Liquid: Impaired Presentation: Cup Oral Phase Impairments:  (none) Oral Phase Functional Implications:  (none) Pharyngeal  Phase Impairments: Cough - Immediate;Throat Clearing - Delayed    Nectar Thick Nectar Thick Liquid: Impaired Presentation: Cup Oral Phase Impairments:  (none) Oral phase functional implications:  (none) Pharyngeal Phase Impairments: Cough - Immediate;Cough - Delayed   Honey Thick Honey Thick Liquid: Not tested   Puree Puree: Not tested   Solid     Solid: Not tested      Houston Siren 04/06/2021,3:59 PM Orbie Pyo Colvin Caroli.Ed Risk analyst [phone] Office [phone]

## 2021-04-06 NOTE — ED Notes (Signed)
Date and time results received: 04/06/21 0306  Test: Sodium Critical Value: 109  Name of Provider Notified: Gerlene Fee, MD  Orders Received? Or Actions Taken?: n/a

## 2021-04-06 NOTE — ED Notes (Signed)
Pt appears to be resting comfortably at this time. No signs of distress noted. Chest tube drainage system working properly at this time. Will continue to monitor.

## 2021-04-07 ENCOUNTER — Inpatient Hospital Stay (HOSPITAL_COMMUNITY): Payer: Medicaid Other

## 2021-04-07 DIAGNOSIS — J9601 Acute respiratory failure with hypoxia: Secondary | ICD-10-CM

## 2021-04-07 DIAGNOSIS — J9621 Acute and chronic respiratory failure with hypoxia: Secondary | ICD-10-CM

## 2021-04-07 LAB — BASIC METABOLIC PANEL
Anion gap: 10 (ref 5–15)
BUN: <5 mg/dL — ABNORMAL LOW (ref 6–20)
CO2: 32 mmol/L (ref 22–32)
Calcium: 8 mg/dL — ABNORMAL LOW (ref 8.9–10.3)
Chloride: 79 mmol/L — ABNORMAL LOW (ref 98–111)
Creatinine, Ser: <0.3 mg/dL — ABNORMAL LOW (ref 0.61–1.24)
Glucose, Bld: 115 mg/dL — ABNORMAL HIGH (ref 70–99)
Potassium: 3.2 mmol/L — ABNORMAL LOW (ref 3.5–5.1)
Sodium: 121 mmol/L — ABNORMAL LOW (ref 135–145)

## 2021-04-07 LAB — CBC WITH DIFFERENTIAL/PLATELET
Abs Immature Granulocytes: 0.8 10*3/uL — ABNORMAL HIGH (ref 0.00–0.07)
Basophils Absolute: 0.1 10*3/uL (ref 0.0–0.1)
Basophils Relative: 1 %
Eosinophils Absolute: 0 10*3/uL (ref 0.0–0.5)
Eosinophils Relative: 0 %
HCT: 31 % — ABNORMAL LOW (ref 39.0–52.0)
Hemoglobin: 11.2 g/dL — ABNORMAL LOW (ref 13.0–17.0)
Immature Granulocytes: 5 %
Lymphocytes Relative: 4 %
Lymphs Abs: 0.6 10*3/uL — ABNORMAL LOW (ref 0.7–4.0)
MCH: 34.5 pg — ABNORMAL HIGH (ref 26.0–34.0)
MCHC: 36.1 g/dL — ABNORMAL HIGH (ref 30.0–36.0)
MCV: 95.4 fL (ref 80.0–100.0)
Monocytes Absolute: 0.3 10*3/uL (ref 0.1–1.0)
Monocytes Relative: 2 %
Neutro Abs: 15.2 10*3/uL — ABNORMAL HIGH (ref 1.7–7.7)
Neutrophils Relative %: 88 %
Platelets: 148 10*3/uL — ABNORMAL LOW (ref 150–400)
RBC: 3.25 MIL/uL — ABNORMAL LOW (ref 4.22–5.81)
RDW: 11.8 % (ref 11.5–15.5)
WBC: 17 10*3/uL — ABNORMAL HIGH (ref 4.0–10.5)
nRBC: 0 % (ref 0.0–0.2)

## 2021-04-07 LAB — PHOSPHORUS
Phosphorus: 1.6 mg/dL — ABNORMAL LOW (ref 2.5–4.6)
Phosphorus: 2.2 mg/dL — ABNORMAL LOW (ref 2.5–4.6)

## 2021-04-07 LAB — ECHOCARDIOGRAM COMPLETE
AR max vel: 2.83 cm2
AV Area VTI: 4.82 cm2
AV Area mean vel: 2.87 cm2
AV Mean grad: 2 mmHg
AV Peak grad: 4.4 mmHg
Ao pk vel: 1.05 m/s
Area-P 1/2: 2.76 cm2
Calc EF: 22.1 %
Height: 67 in
MV M vel: 2.4 m/s
MV Peak grad: 23 mmHg
S' Lateral: 2.8 cm
Single Plane A2C EF: 41.3 %
Single Plane A4C EF: -5.3 %
Weight: 1516.76 oz

## 2021-04-07 LAB — MAGNESIUM: Magnesium: 1.9 mg/dL (ref 1.7–2.4)

## 2021-04-07 LAB — GLUCOSE, CAPILLARY
Glucose-Capillary: 84 mg/dL (ref 70–99)
Glucose-Capillary: 135 mg/dL — ABNORMAL HIGH (ref 70–99)
Glucose-Capillary: 248 mg/dL — ABNORMAL HIGH (ref 70–99)
Glucose-Capillary: 194 mg/dL — ABNORMAL HIGH (ref 70–99)

## 2021-04-07 LAB — HEPATIC FUNCTION PANEL
ALT: 345 U/L — ABNORMAL HIGH (ref 0–44)
AST: 666 U/L — ABNORMAL HIGH (ref 15–41)
Albumin: 2.3 g/dL — ABNORMAL LOW (ref 3.5–5.0)
Alkaline Phosphatase: 66 U/L (ref 38–126)
Bilirubin, Direct: 0.2 mg/dL (ref 0.0–0.2)
Indirect Bilirubin: 0.3 mg/dL (ref 0.3–0.9)
Total Bilirubin: 0.5 mg/dL (ref 0.3–1.2)
Total Protein: 5.5 g/dL — ABNORMAL LOW (ref 6.5–8.1)

## 2021-04-07 LAB — PROCALCITONIN: Procalcitonin: 3.37 ng/mL

## 2021-04-07 MED ORDER — OXYCODONE-ACETAMINOPHEN 5-325 MG PO TABS
1.0000 | ORAL_TABLET | ORAL | Status: DC | PRN
  Administered 2021-04-07 – 2021-04-09 (×8): 2
  Administered 2021-04-09: 1
  Administered 2021-04-10 (×2): 2
  Filled 2021-04-07 (×13): qty 2

## 2021-04-07 MED ORDER — SODIUM CHLORIDE 0.9 % IV SOLN
1.0000 g | INTRAVENOUS | Status: DC
  Administered 2021-04-07 – 2021-04-08 (×2): 1 g via INTRAVENOUS
  Filled 2021-04-07: qty 1
  Filled 2021-04-07: qty 10

## 2021-04-07 MED ORDER — DOXYCYCLINE HYCLATE 100 MG PO TABS
100.0000 mg | ORAL_TABLET | Freq: Two times a day (BID) | ORAL | Status: AC
  Administered 2021-04-07 – 2021-04-11 (×10): 100 mg
  Filled 2021-04-07 (×10): qty 1

## 2021-04-07 MED ORDER — POTASSIUM PHOSPHATES 15 MMOLE/5ML IV SOLN
45.0000 mmol | Freq: Once | INTRAVENOUS | Status: AC
  Administered 2021-04-07: 45 mmol via INTRAVENOUS
  Filled 2021-04-07: qty 15

## 2021-04-07 MED ORDER — KETOROLAC TROMETHAMINE 15 MG/ML IJ SOLN
15.0000 mg | Freq: Four times a day (QID) | INTRAMUSCULAR | Status: AC | PRN
  Administered 2021-04-07 – 2021-04-08 (×2): 15 mg via INTRAVENOUS
  Filled 2021-04-07 (×3): qty 1

## 2021-04-07 MED ORDER — OSMOLITE 1.5 CAL PO LIQD
237.0000 mL | Freq: Every day | ORAL | Status: DC
  Administered 2021-04-07 – 2021-04-12 (×22): 237 mL
  Filled 2021-04-07 (×27): qty 237

## 2021-04-07 NOTE — Progress Notes (Signed)
[facility] ADULT ICU REPLACEMENT PROTOCOL   The patient does apply for the [facility] Adult ICU Electrolyte Replacment Protocol based on the criteria listed below:   1. Is GFR >/= 30 ml/min? Yes.    Patient's GFR today is  2. Is SCr </= 2? Yes.   Patient's SCr is < 0.30 ml/kg/hr 3. Did SCr increase >/= 0.5 in 24 hours? No. 4. Abnormal electrolyte(s):  K 3.2, Phos 1.6  5. Ordered repletion with: protocol 6. If a panic level lab has been reported, has the CCM MD in charge been notified? Yes.  .   Physician:  Lowella Bandy R Diyari Cherne 04/07/2021 5:10 AMl

## 2021-04-07 NOTE — Progress Notes (Signed)
eLink Physician-Brief Progress Note Patient Name: NNAMDI DACUS DOB: Dec 06, 1975 MRN: [id number]   Date of Service  04/07/2021  HPI/Events of Note  Patient complaining of chest tube insertion site pain not relieved by Percocet. Creatinine is normal.  eICU Interventions  PRN iv Toradol 15 mg Q 6 hours x 3 doses then discontinue ordered.        Kerry Kass Nickole Adamek 04/07/2021, 9:14 PM

## 2021-04-07 NOTE — Progress Notes (Addendum)
NAME:  [name], MRN:  [id number], DOB:  1976/08/31, LOS: 1 ADMISSION DATE:  04/06/2021, CONSULTATION DATE: 04/06/2021 REFERRING MD: [facility] ED, CHIEF COMPLAINT: Hypoxia, elevated lactic acid, elevated troponins.  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  He underwent radiation and chemotherapy pleated in January 2022.  PET scan revealed hypermetabolic state of the epiglottis.  On 02/20/2021 he had biopsy by Dr. Benjamine Mola did not reveal any cancerous cells.  Of note he has continued to smoke, drink alcohol, and now presents to [facility] 04/06/2021 with 3 days of increasing shortness of breath.  In the emergency department sats are noted to be 74% chest x-ray revealed right tension pneumothorax and a right pigtail chest tube catheter was placed by the [facility] emergency department physician.  He was noted to have elevated lactic acid of 7, elevated white count of 22 chest x-ray with possible multifocal pneumonia.  Therefore he is treated as pneumonia chest tube placed supplemental oxygen of 60% on nonrebreather and transferred to [facility] for further evaluation and treatment.  Sodium was noted to be 109 suspect this is secondary to his alcohol consumption.  He will be further evaluated and treated at [facility].  When questioned he wants to be a full code  Pertinent  Medical History   Past Medical History:  Diagnosis Date   Back pain    Bradycardia 08/17/2011   Cancer ([place])    COPD (chronic obstructive pulmonary disease) ([place])    ETOH abuse    GERD (gastroesophageal reflux disease)    Hypertension    Macrocytosis 08/17/2011   Pancreatitis    Pneumonia    Port-A-Cath in place 09/04/2020   SAH (subarachnoid hemorrhage) ([place])    Seizures ([place])    last one 6 mos ago     Significant Hospital Events: Including procedures, antibiotic start and stop dates in addition to other  pertinent events   04/06/2021 right chest tube placed at [facility] for tension pneumothorax right  Interim History / Subjective:  No complaints of chest pain pain at chest tube site Objective   Blood pressure 91/64, pulse 90, temperature 98.1 F (36.7 C), temperature source Oral, resp. rate 19, height 5\' 7"  (1.702 m), weight 43 kg, SpO2 99 %.    FiO2 (%):  [60 %-75 %] 75 %   Intake/Output Summary (Last 24 hours) at 04/07/2021 0920 Last data filed at 04/07/2021 0700 Gross per 24 hour  Intake 2451.91 ml  Output 4368 ml  Net -1916.09 ml   Filed Weights   04/06/21 0240 04/06/21 0630  Weight: 44.5 kg 43 kg    Examination: .General: Thin male who appears better today 04/07/2021 HEENT: Edentulous continues to have hoarse voice and stridor Neuro: Intact without focal defect much more awake and interactive today CV: Heart sounds are regular PULM: Coarse rhonchi bilaterally GI: soft, bsx4 active PEG is in place GU: Voids Extremities: warm/dry,  edema  Skin: no rashes or lesions    Labs/imaging that I havepersonally reviewed  (right click and "Reselect all SmartList Selections" daily)  All labs  reviewed   Resolved Hospital Problem list     Assessment & Plan:  Acute on chronic hypoxic respiratory failure most likely multifactorial in the setting of recent head neck cancer, severe COPD with continued tobacco abuse, reported alcohol abuse despite having PEG in place since 08/22/2020.  Complicated by spontaneous tension right  pneumothorax on 04/06/2021 was transferred to [facility] for further evaluation and treatment. Pna.  Note urine strep pneumo positive.  Currently on cefepime and Vibramycin  04/07/2021 no air leak noted on the right chest tube at this time. Will transition to waterseal with follow-up chest x-ray in a.m. 04/08/2021 if pneumo does not worsen consider removing chest tube Treating as this a community-acquired pneumonia in the setting of suspected chronic  aspiration Bronchodilator Wean oxygen as tolerated currently on 6 L with O2 saturations of 98% Continue steroid Continue antimicrobial therapy       Hyponatremia 109 on admission with last of sodium on 02/20/2021 of 125. Maybe beer proteinemia Recent Labs  Lab 04/06/21 0233 04/06/21 0849 04/07/21 0243  NA 109* 111* 121*   Monitor sodium Has self corrected to 121 No interventions required  Squamous cell head neck cancer status post chemo/radiation therapy completed 10/30/2020.  Further biopsy 02/20/2021 by Dr. Benjamine Mola with no malignant cells identified. Continue to monitor  Elevated cardiac enzyme noted to be 1439 most likely stress No evidence of acute coronary syndrome Continue to monitor    Monitor for etoh withdrawal Continue thiamine and folic acid   History of subarachnoid hemorrhage and seizure disorder Continue analeptics  Severe protein malnourishment secondary to head neck cancer Dietary consult Tube feedings  Patient and significant other updated 04/06/2021   Best practice (right click and "Reselect all SmartList Selections" daily)  Diet:  Tube Feed  Pain/Anxiety/Delirium protocol (if indicated): No VAP protocol (if indicated): Yes DVT prophylaxis: Subcutaneous Heparin GI prophylaxis: PPI Glucose control:  SSI No Central venous access:  Yes, and it is still needed Arterial line:  Yes, and it is still needed Foley:  Yes, and it is still needed Mobility:  bed rest  PT consulted: N/A Last date of multidisciplinary goals of care discussion [TBD] Code Status:  full code Disposition: ICU  Labs   CBC: Recent Labs  Lab 04/06/21 0233 04/07/21 0243  WBC 23.2* 17.0*  NEUTROABS  --  15.2*  HGB 13.1 11.2*  HCT 38.0* 31.0*  MCV 99.5 95.4  PLT 220 148*    Basic Metabolic Panel: Recent Labs  Lab 04/06/21 0233 04/06/21 0849 04/06/21 1107 04/07/21 0243  NA 109* 111*  --  121*  K 3.2* 3.1*  --  3.2*  CL 69* 70*  --  79*  CO2 24 28  --  32  GLUCOSE  213* 147*  --  115*  BUN <5* <5*  --  <5*  CREATININE <0.30* 0.41*  --  <0.30*  CALCIUM 8.0* 7.4*  --  8.0*  MG  --   --  1.5* 1.9  PHOS  --   --  2.4* 1.6*   GFR: CrCl cannot be calculated (This lab value cannot be used to calculate CrCl because it is not a number: <0.30). Recent Labs  Lab 04/06/21 0233 04/06/21 0403 04/06/21 0849 04/07/21 0243  PROCALCITON  --   --  5.38 3.37  WBC 23.2*  --   --  17.0*  LATICACIDVEN 7.0* 4.5* 3.2*  --     Liver Function Tests: Recent Labs  Lab 04/06/21 0233 04/06/21 0849 04/07/21 0243  AST 35 1,484* 666*  ALT 14 472* 345*  ALKPHOS 129* 87 66  BILITOT 0.6 0.5 0.5  PROT 7.1 5.4* 5.5*  ALBUMIN 3.0* 2.4* 2.3*   No results for input(s): LIPASE, AMYLASE in the last 168 hours. No results for input(s): AMMONIA in the last 168 hours.  ABG  Component Value Date/Time   PHART 7.451 (H) 04/10/2007 0916   PCO2ART 32.8 (L) 04/10/2007 0916   PO2ART 58.0 (L) 04/10/2007 0916   HCO3 21.9 04/06/2021 0246   TCO2 16 10/01/2016 0021   ACIDBASEDEF 0.4 04/07/2007 0400   O2SAT 31.8 04/06/2021 0246     Coagulation Profile: Recent Labs  Lab 04/06/21 0233  INR 1.2    Cardiac Enzymes: No results for input(s): CKTOTAL, CKMB, CKMBINDEX, TROPONINI in the last 168 hours.  HbA1C: No results found for: HGBA1C  CBG: Recent Labs  Lab 04/06/21 1557 04/06/21 1908 04/06/21 2319 04/07/21 0359 04/07/21 0739  GLUCAP 180* 229* 259* 84 135*     Critical care time: [place] ACNP Acute Care Nurse Practitioner [place] Please consult Amion 04/07/2021, 9:20 AM  Attending note: I have seen and examined the patient. History, labs and imaging reviewed.  45 year old with squamous cell cancer of epiglottis in 2021 status post chemoradiation.  Admitted with large right tension pneumothorax and sepsis secondary to multifocal pneumococcus pneumonia.  Also noted to have a sodium of 109  No acute events overnight.  Transitioned to 6 L high flow nasal cannula  Blood pressure 91/64, pulse 90, temperature 98.1 F (36.7 C), temperature source Oral, resp. rate 19, height 5\' 7"  (1.702 m), weight 43 kg, SpO2 99 %. Gen:      No acute distress, chronically ill-appearing, malnourished HEENT:  EOMI, sclera anicteric Neck:     No masses; no thyromegaly Lungs:    Clear to auscultation bilaterally; normal respiratory effort, chest tube with no air leak CV:         Regular rate and rhythm; no murmurs Abd:      + bowel sounds; soft, non-tender; no palpable masses, no distension Ext:    No edema; adequate peripheral perfusion Skin:      Warm and dry; no rash Neuro: Awake, oriented  Labs/Imaging personally reviewed, significant for Sodium improved to 121, creatinine less than 0.30, PCT 3.37 Urine strep pneumo is positive  Chest x-ray reviewed with reduced right pneumothorax, persistent bilateral infiltrates  Assessment/plan: Sepsis present on admit  Multifocal pneumococcus pneumonia, right tension pneumothorax Can narrow antibiotics to ceftriaxone Chest tube to water seal, follow chest x ray Continue steroids, bronchodilators     Hyponatremia may be secondary to alcohol intake, pneumonia  Sodium 121 today which is his baseline Mental status is good DC normal saline    Elevated cardiac, flat trend Monitor, check echocardiogram     Severe protein calorie malnutrition Tube feeds, nutrition consult Watch for etoh withdrawal   Stable for transfer out of ICU and to hospitalist service.  PCCM will follow for chest tube  Marshell Garfinkel MD [place] [facility] 04/07/2021, 9:09 AM

## 2021-04-07 NOTE — Plan of Care (Signed)

## 2021-04-07 NOTE — Progress Notes (Signed)
Modified Barium Swallow Progress Note  Patient Details  Name: [name] MRN: [id number] Date of Birth: 05/24/1976  Today's Date: 04/07/2021  Modified Barium Swallow completed.  Full report located under Chart Review in the Imaging Section.  Brief recommendations include the following:  Clinical Impression  Pt exhibits severe pharyngeal dysphagia significantly worsened since radiation/chemo treatement. MBS 07/2020 and today's were compared revealing gross aspiration of thin, nectar and puree consistencies barium. His epiglottis was not appreciated during today's study cooresponding to results of direct laryngoscopy 4/45/22 which revealed partial erosion of the epiglottis, with necrotic debris. There was no movement of hyoid during swallows with thin, nectar and minimal anterior elevation. Barium directed into laryngeal vestiuble onto vocal cords and aspirated silently with thin and nectar without awareness. Minimal barium was able to pass UES into esophagus with thin. Maximum puree residue at pyriform sinuses that fell onto vocal cords initiating a throat clear that propelled back to oral cavity to be suctioned. Therapist cued pt to hold breath when material in oral cavity just prior to swallow. This was challenging for pt and he was unable to demonstrate. It is recommended that he continue NPO status using PEG for nutrition allowing ice chips after oral care intermittently.He is significantly malnourished with inadequate reserve. Recommend thorough oral care. Palliative care may be beneficial for pt. SLP can each pt pharyngeal exercises to decrease laryngeal/pharyngeal atrophy and fibrotic tissue.   Swallow Evaluation Recommendations       NPO, ice chips after oral care, Palliative care.                                                                                                    Houston Siren 04/07/2021,3:01 PM

## 2021-04-07 NOTE — Progress Notes (Signed)
2D echocardiogram completed.  04/07/2021 2:07 PM Kelby Aline., MHA, RVT, RDCS, RDMS

## 2021-04-08 ENCOUNTER — Inpatient Hospital Stay (HOSPITAL_COMMUNITY): Payer: Medicaid Other

## 2021-04-08 DIAGNOSIS — E43 Unspecified severe protein-calorie malnutrition: Secondary | ICD-10-CM

## 2021-04-08 DIAGNOSIS — J930 Spontaneous tension pneumothorax: Secondary | ICD-10-CM

## 2021-04-08 LAB — BASIC METABOLIC PANEL
Anion gap: 13 (ref 5–15)
Anion gap: 7 (ref 5–15)
BUN: 10 mg/dL (ref 6–20)
BUN: 8 mg/dL (ref 6–20)
CO2: 29 mmol/L (ref 22–32)
CO2: 32 mmol/L (ref 22–32)
Calcium: 8.3 mg/dL — ABNORMAL LOW (ref 8.9–10.3)
Calcium: 7.6 mg/dL — ABNORMAL LOW (ref 8.9–10.3)
Chloride: 79 mmol/L — ABNORMAL LOW (ref 98–111)
Chloride: 82 mmol/L — ABNORMAL LOW (ref 98–111)
Creatinine, Ser: 0.32 mg/dL — ABNORMAL LOW (ref 0.61–1.24)
Creatinine, Ser: <0.3 mg/dL — ABNORMAL LOW (ref 0.61–1.24)
GFR, Estimated: >60 mL/min (ref >60)
Glucose, Bld: 110 mg/dL — ABNORMAL HIGH (ref 70–99)
Glucose, Bld: 128 mg/dL — ABNORMAL HIGH (ref 70–99)
Potassium: 3.6 mmol/L (ref 3.5–5.1)
Potassium: 3.1 mmol/L — ABNORMAL LOW (ref 3.5–5.1)
Sodium: 121 mmol/L — ABNORMAL LOW (ref 135–145)
Sodium: 121 mmol/L — ABNORMAL LOW (ref 135–145)

## 2021-04-08 LAB — LEGIONELLA PNEUMOPHILA SEROGP 1 UR AG: L. pneumophila Serogp 1 Ur Ag: NEGATIVE

## 2021-04-08 LAB — PHOSPHORUS: Phosphorus: 1.9 mg/dL — ABNORMAL LOW (ref 2.5–4.6)

## 2021-04-08 LAB — PROCALCITONIN: Procalcitonin: 1.49 ng/mL

## 2021-04-08 LAB — MAGNESIUM: Magnesium: 1.9 mg/dL (ref 1.7–2.4)

## 2021-04-08 LAB — TSH: TSH: 1.867 u[IU]/mL (ref 0.350–4.500)

## 2021-04-08 MED ORDER — POTASSIUM CHLORIDE 20 MEQ PO PACK
40.0000 meq | PACK | Freq: Once | ORAL | Status: AC
  Administered 2021-04-08: 40 meq
  Filled 2021-04-08: qty 2

## 2021-04-08 MED ORDER — HYDRALAZINE HCL 20 MG/ML IJ SOLN: 10.0000 mg | INTRAMUSCULAR | Status: DC | PRN

## 2021-04-08 MED ORDER — SODIUM CHLORIDE 0.9 % IV SOLN: INTRAVENOUS | Status: AC

## 2021-04-08 MED ORDER — IPRATROPIUM-ALBUTEROL 0.5-2.5 (3) MG/3ML IN SOLN: 3.0000 mL | RESPIRATORY_TRACT | Status: DC | PRN

## 2021-04-08 MED ORDER — POTASSIUM CHLORIDE 10 MEQ/50ML IV SOLN
10.0000 meq | INTRAVENOUS | Status: AC
  Administered 2021-04-08 – 2021-04-09 (×4): 10 meq via INTRAVENOUS
  Filled 2021-04-08 (×4): qty 50

## 2021-04-08 NOTE — Progress Notes (Signed)
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  Chest tube catheter was placed and transferred to [facility].  Initially also hyponatremic thought to be due to alcohol use but improved spontaneously.   Assessment & Plan:   Active Problems:   Respiratory failure (HCC)   Pressure injury of skin   Protein-calorie malnutrition, severe   Acute on chronic hypoxic respiratory failure secondary to right-sided tension pneumothorax - Status post chest tube placement 6/9.  Pulmonary team following. - Wean oxygen as appropriate - Currently to waterseal, management per pulmonary - Out of bed to chair.  I-S/flutter - Nebs as needed  Community-acquired pneumonia - Initially suspicion of chronic aspiration. - Antibiotics-Rocephin, doxycycline - Steroids  Hyponatremia - Suspect due to alcohol use.  Will give gentle hydration.  Elevated troponin - Suspect secondary to demand ischemia.  No evidence of ACS.  Continue to monitor. - Echocardiogram EF 50-55%  Alcohol use - Alcohol withdrawal protocol - Multivitamin, thiamine, folate  Transaminitis - Suspect alcohol hepatitis.  Supportive care.  Monitor LFTs.  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 Benjamine Mola.  Supportive care  History of subarachnoid hemorrhage and seizure disorder - On Tegretol and Keppra  Severe protein calorie malnutrition - Dietitian following.  On tube feeds. -Seen by speech.  Continues to be n.p.o. Nutritional status  Nutrition Problem: Severe Malnutrition Etiology: chronic illness  (epiglottis cancer s/p chemotherapy and radiation, EtOH abus)  Signs/Symptoms: severe muscle depletion, severe fat depletion, percent weight loss (14.5% weight loss in less than 7 months) Percent weight loss: 14.5 %  Interventions: MVI, Prostat, Tube feeding   DVT prophylaxis: Subcu heparin Code Status: Full code Family Communication:    Status is: Inpatient    Dispo: The patient is from: Home              Anticipated d/c is to: Home              Patient currently is not medically stable to d/c.  Currently has chest tube in place and ongoing evaluation for his feeding.  Unsafe discharge.   Difficult to place patient No         Body mass index is 14.85 kg/m.  Pressure Injury 04/06/21 Coccyx Mid Stage 1 -  Intact skin with non-blanchable redness of a localized area usually over a bony prominence. stage 1 sacrum (Active)  04/06/21 0630  Location: Coccyx  Location Orientation: Mid  Staging: Stage 1 -  Intact skin with non-blanchable redness of a localized area usually over a bony prominence.  Wound Description (Comments): stage 1 sacrum  Present on Admission: Yes    Subjective: No new complaints. Asking for his pain medications.  Coughing with ICE chips during my visit.   Review of Systems Otherwise negative except as per HPI, including: General = no fevers, chills, dizziness,  fatigue HEENT/EYES = negative for loss of vision, double vision, blurred vision,  sore throa Cardiovascular= negative for chest pain, palpitation Respiratory/lungs= negative for shortness of breath, cough, wheezing; hemoptysis,  Gastrointestinal= negative for nausea, vomiting, abdominal pain Genitourinary= negative for Dysuria MSK = Negative for arthralgia, myalgias  Neurology= Negative for headache, numbness, tingling  Psychiatry= Negative for suicidal and homocidal ideation Skin= Negative for Rash   Examination:  Constitutional: Not in acute distress, temporal wasting, chronically ill   Respiratory: Clear to auscultation bilaterally Cardiovascular: Normal sinus rhythm, no rubs Abdomen: Nontender nondistended good bowel sounds Musculoskeletal: No edema noted Skin: No rashes seen Neurologic: CN 2-12 grossly intact.  And nonfocal Psychiatric: Normal judgment and insight. Alert and oriented x 3. Normal mood.    PEG tube in place Chest tube in place  Objective: Vitals:   04/08/21 0230 04/08/21 0300 04/08/21 0400 04/08/21 0447  BP: 103/64 111/74 123/78   Pulse: 91  100   Resp: 20  (!) 22   Temp:   97.9 F (36.6 C)   TempSrc:   Oral   SpO2: 91%  97% 95%  Weight:      Height:        Intake/Output Summary (Last 24 hours) at 04/08/2021 0755 Last data filed at 04/08/2021 0550 Gross per 24 hour  Intake 1241 ml  Output 1539 ml  Net -298 ml   Filed Weights   04/06/21 0240 04/06/21 0630  Weight: 44.5 kg 43 kg     Data Reviewed:   CBC: Recent Labs  Lab 04/06/21 0233 04/07/21 0243  WBC 23.2* 17.0*  NEUTROABS  --  15.2*  HGB 13.1 11.2*  HCT 38.0* 31.0*  MCV 99.5 95.4  PLT 220 128*   Basic Metabolic Panel: Recent Labs  Lab 04/06/21 0233 04/06/21 0849 04/06/21 1107 04/07/21 0243 04/07/21 2028 04/08/21 0343  NA 109* 111*  --  121*  --  121*  K 3.2* 3.1*  --  3.2*  --  3.6  CL 69* 70*  --  79*  --  79*  CO2 24 28  --  32  --  29  GLUCOSE 213* 147*  --  115*  --  110*  BUN <5* <5*  --  <5*  --  10  CREATININE <0.30* 0.41*  --  <0.30*  --  0.32*  CALCIUM 8.0* 7.4*  --  8.0*  --  8.3*  MG  --   --  1.5* 1.9  --  1.9  PHOS  --   --  2.4* 1.6* 2.2* 1.9*   GFR: Estimated Creatinine Clearance: 70.2 mL/min (A) (by C-G formula based on SCr of 0.32 mg/dL (L)). Liver Function Tests: Recent Labs  Lab 04/06/21 0233 04/06/21 0849 04/07/21 0243  AST 35 1,484* 666*  ALT 14 472* 345*  ALKPHOS 129* 87 66  BILITOT 0.6 0.5 0.5  PROT 7.1 5.4* 5.5*  ALBUMIN 3.0* 2.4* 2.3*   No results for input(s): LIPASE, AMYLASE in the last 168 hours. No results for  input(s): AMMONIA in the last 168 hours. Coagulation Profile: Recent Labs  Lab 04/06/21 0233  INR 1.2   Cardiac Enzymes: No results for input(s): CKTOTAL, CKMB, CKMBINDEX, TROPONINI in the last 168 hours. BNP (last 3 results) No results for input(s): PROBNP in the last 8760 hours. HbA1C: No results for input(s): HGBA1C in the last 72 hours. CBG: Recent Labs  Lab 04/06/21 2319 04/07/21 0359 04/07/21 0739 04/07/21 1145 04/07/21 1613  GLUCAP 259* 84 135* 248* 194*   Lipid Profile: No results for input(s): CHOL, HDL, LDLCALC, TRIG, CHOLHDL, LDLDIRECT in the last 72 hours. Thyroid Function Tests: No results for input(s): TSH, T4TOTAL, FREET4, T3FREE, THYROIDAB in the last 72 hours. Anemia Panel: No results for input(s): VITAMINB12, FOLATE, FERRITIN, TIBC, IRON, RETICCTPCT in the  last 72 hours. Sepsis Labs: Recent Labs  Lab 04/06/21 0233 04/06/21 0403 04/06/21 0849 04/07/21 0243 04/08/21 0343  PROCALCITON  --   --  5.38 3.37 1.49  LATICACIDVEN 7.0* 4.5* 3.2*  --   --     Recent Results (from the past 240 hour(s))  Culture, blood (single) w Reflex to ID Panel     Status: None (Preliminary result)   Collection Time: 04/06/21  2:33 AM   Specimen: BLOOD  Result Value Ref Range Status   Specimen Description BLOOD PORTA CATH  Final   Special Requests   Final    BOTTLES DRAWN AEROBIC AND ANAEROBIC Blood Culture adequate volume   Culture   Final    NO GROWTH 1 DAY Performed at [facility], [address], [place], [place] [address]    Report Status PENDING  Incomplete  Resp Panel by RT-PCR (Flu A&B, Covid) Nasopharyngeal Swab     Status: None   Collection Time: 04/06/21  4:04 AM   Specimen: Nasopharyngeal Swab; Nasopharyngeal(NP) swabs in vial transport medium  Result Value Ref Range Status   SARS Coronavirus 2 by RT PCR NEGATIVE NEGATIVE Final    Comment: (NOTE) SARS-CoV-2 target nucleic acids are NOT DETECTED.  The SARS-CoV-2 RNA is generally detectable in upper  respiratory specimens during the acute phase of infection. The lowest concentration of SARS-CoV-2 viral copies this assay can detect is 138 copies/mL. A negative result does not preclude SARS-Cov-2 infection and should not be used as the sole basis for treatment or other patient management decisions. A negative result may occur with  improper specimen collection/handling, submission of specimen other than nasopharyngeal swab, presence of viral mutation(s) within the areas targeted by this assay, and inadequate number of viral copies(<138 copies/mL). A negative result must be combined with clinical observations, patient history, and epidemiological information. The expected result is Negative.  Fact Sheet for Patients:  EntrepreneurPulse.com.au  Fact Sheet for Healthcare Providers:  IncredibleEmployment.be  This test is no t yet approved or cleared by the [place] FDA and  has been authorized for detection and/or diagnosis of SARS-CoV-2 by FDA under an Emergency Use Authorization (EUA). This EUA will remain  in effect (meaning this test can be used) for the duration of the COVID-19 declaration under Section 564(b)(1) of the Act, 21 U.S.C.section 360bbb-3(b)(1), unless the authorization is terminated  or revoked sooner.       Influenza A by PCR NEGATIVE NEGATIVE Final   Influenza B by PCR NEGATIVE NEGATIVE Final    Comment: (NOTE) The Xpert Xpress SARS-CoV-2/FLU/RSV plus assay is intended as an aid in the diagnosis of influenza from Nasopharyngeal swab specimens and should not be used as a sole basis for treatment. Nasal washings and aspirates are unacceptable for Xpert Xpress SARS-CoV-2/FLU/RSV testing.  Fact Sheet for Patients: EntrepreneurPulse.com.au  Fact Sheet for Healthcare Providers: IncredibleEmployment.be  This test is not yet approved or cleared by the [place] FDA and has been  authorized for detection and/or diagnosis of SARS-CoV-2 by FDA under an Emergency Use Authorization (EUA). This EUA will remain in effect (meaning this test can be used) for the duration of the COVID-19 declaration under Section 564(b)(1) of the Act, 21 U.S.C. section 360bbb-3(b)(1), unless the authorization is terminated or revoked.  Performed at [facility], [address], [place], [place] [address]   MRSA PCR Screening     Status: None   Collection Time: 04/06/21  6:31 AM   Specimen: Nasal Mucosa; Nasopharyngeal  Result Value Ref Range  Status   MRSA by PCR NEGATIVE NEGATIVE Final    Comment:        The GeneXpert MRSA Assay (FDA approved for NASAL specimens only), is one component of a comprehensive MRSA colonization surveillance program. It is not intended to diagnose MRSA infection nor to guide or monitor treatment for MRSA infections. Performed at [facility], [place] [address], [place], [place] [address]          Radiology Studies: DG CHEST PORT 1 VIEW  Result Date: 04/07/2021 CLINICAL DATA:  Follow-up right-sided pneumothorax EXAM: PORTABLE CHEST 1 VIEW COMPARISON:  Film from earlier in the same day. FINDINGS: Pigtail catheter is again noted on the right. Small apical pneumothorax is again seen and stable. Patchy airspace opacities are identified throughout both lungs consistent with multifocal pneumonia. Left chest wall port is again seen. Cardiac shadow is stable. IMPRESSION: No significant interval change from the prior exam. Electronically Signed   By: Inez Catalina M.D.   On: 04/07/2021 16:28   DG Chest Port 1 View  Result Date: 04/07/2021 CLINICAL DATA:  Abnormal respirations. EXAM: PORTABLE CHEST 1 VIEW COMPARISON:  04/06/2021 FINDINGS: Right chest tube remains in place. Tiny amount of pleural air at the apex, less than seen yesterday. Widespread pulmonary infiltrates persist, more extensive in the left lung than the right, similar to yesterday's study.  IMPRESSION: Reduction in size of right pneumothorax. Persistent bilateral pulmonary infiltrates consistent with pneumonia. Electronically Signed   By: Nelson Chimes M.D.   On: 04/07/2021 07:35   DG Swallowing Func-Speech Pathology  Result Date: 04/07/2021 Formatting of this result is different from the original. Objective Swallowing Evaluation: Type of Study: MBS-Modified Barium Swallow Study  Patient Details Name: [name] MRN: [id number] Date of Birth: 1976-06-09 Today's Date: 04/07/2021 Time: SLP Start Time (ACUTE ONLY): 35 -SLP Stop Time (ACUTE ONLY): 1340 SLP Time Calculation (min) (ACUTE ONLY): 21 min Past Medical History: Past Medical History: Diagnosis Date  Back pain   Bradycardia 08/17/2011  Cancer ([place])   COPD (chronic obstructive pulmonary disease) (HCC)   ETOH abuse   GERD (gastroesophageal reflux disease)   Hypertension   Macrocytosis 08/17/2011  Pancreatitis   Pneumonia   Port-A-Cath in place 09/04/2020  SAH (subarachnoid hemorrhage) ([place])   Seizures ([place])   last one 6 mos ago Past Surgical History: Past Surgical History: Procedure Laterality Date  BACK SURGERY    DIRECT LARYNGOSCOPY N/A 02/20/2021  Procedure: DIRECT LARYNGOSCOPY WITH BIOPSY;  Surgeon: Leta Baptist, MD;  Location: [facility] OR;  Service: ENT;  Laterality: N/A;  ESOPHAGOGASTRODUODENOSCOPY (EGD) WITH PROPOFOL N/A 08/14/2017  Procedure: ESOPHAGOGASTRODUODENOSCOPY (EGD) WITH PROPOFOL;  Surgeon: Daneil Dolin, MD;  Location: [facility] END[facility] SUITE;  Service: Endoscopy;  Laterality: N/A;  1:30pm  ESOPHAGOGASTRODUODENOSCOPY (EGD) WITH PROPOFOL N/A 03/13/2018  Procedure: ESOPHAGOGASTRODUODENOSCOPY (EGD) WITH PROPOFOL;  Surgeon: Daneil Dolin, MD;  Location: [facility];  Service: Endoscopy;  Laterality: N/A;  2:30pm  ESOPHAGOGASTRODUODENOSCOPY (EGD) WITH PROPOFOL N/A 08/22/2020  Procedure: ESOPHAGOGASTRODUODENOSCOPY (EGD) WITH PROPOFOL;  Surgeon: Aviva Signs, MD;  Location: [facility] ORS;  Service: General;  Laterality: N/A;  PEG PLACEMENT N/A 08/22/2020   Procedure: PERCUTANEOUS ENDOSCOPIC GASTROSTOMY (PEG) PLACEMENT;  Surgeon: Aviva Signs, MD;  Location: [facility] ORS;  Service: General;  Laterality: N/A;  PORTACATH PLACEMENT Left 08/22/2020  Procedure: INSERTION PORT-A-CATH;  Surgeon: Aviva Signs, MD;  Location: [facility] ORS;  Service: General;  Laterality: Left;  SEPTOPLASTY   HPI: 45 year-old male who was diagnosed with squamous cell cancer of epiglottis in  October 2021.  He underwent radiation and chemotherapy completed 10/2020. PEG placed 07/2020.  Per chart PET scan revealed hypermetabolic state of the epiglottis. hypermetabolic state of the epiglottis. Direct laryngoscopy 02/20/21 with ENT revealed partial erosion of the epiglottis, with necrotic debris covering the rest of the epiglottis, biopsy taken did not reveal any cancerous cells. Pt presented to [facility] increasing shortness of breath. Chest x-ray with possible multifocal pneumonia. CXR revealed Large right tension pneumothorax. Multifocal pulmonary infiltrates, possibly with foci of cavitation  most in keeping with multifocal possibly necrotizing pneumonia. MBS 08/23/20 (prior to treatment) revealed penetration and aspiration after swallow without awareness with wet vocal quality and throat clearing, residue greater with nectar. Rec'd Dys 3, thin, cough post swallow, dysphagia treatment with swallow exercises.  No data recorded Assessment / Plan / Recommendation CHL IP CLINICAL IMPRESSIONS 04/07/2021 Clinical Impression Pt exhibits severe pharyngeal dysphagia significantly worsened since radiation/chemo treatement. MBS 07/2020 and today's were compared revealing gross aspiration of thin, nectar and puree consistencies barium. His epiglottis was not appreciated during today's study cooresponding to results of direct laryngoscopy 4/45/22 which revealed partial erosion of the epiglottis, with necrotic debris. There was no movement of hyoid during swallows with thin, nectar and minimal anterior elevation. Barium  directed into laryngeal vestiuble onto vocal cords and aspirated silently with thin and nectar without awareness. Minimal barium was able to pass UES into esophagus with thin. Maximum puree residue at pyriform sinuses that fell onto vocal cords initiating a throat clear that propelled back to oral cavity to be suctioned. Therapist cued pt to hold breath when material in oral cavity just prior to swallow. This was challenging for pt and he was unable to demonstrate. It is recommended that he continue NPO status using PEG for nutrition allowing ice chips after oral care intermittently.He is significantly malnourished with inadequate reserve. Recommend thorough oral care. Palliative care may be beneficial for pt. SLP can each pt pharyngeal exercises to decrease laryngeal/pharyngeal atrophy and fibrotic tissue. SLP Visit Diagnosis -- Attention and concentration deficit following -- Frontal lobe and executive function deficit following -- Impact on safety and function --   CHL IP TREATMENT RECOMMENDATION 04/06/2021 Treatment Recommendations Therapy as outlined in treatment plan below   Prognosis 04/06/2021 Prognosis for Safe Diet Advancement (No Data) Barriers to Reach Goals (No Data) Barriers/Prognosis Comment -- No flowsheet data found.  No flowsheet data found.  CHL IP FOLLOW UP RECOMMENDATIONS 04/06/2021 Follow up Recommendations Other (comment)   CHL IP FREQUENCY AND DURATION 04/06/2021 Speech Therapy Frequency (ACUTE ONLY) min 2x/week Treatment Duration 2 weeks      CHL IP ORAL PHASE 04/07/2021 Oral Phase WFL Oral - Pudding Teaspoon -- Oral - Pudding Cup -- Oral - Honey Teaspoon -- Oral - Honey Cup -- Oral - Nectar Teaspoon -- Oral - Nectar Cup -- Oral - Nectar Straw -- Oral - Thin Teaspoon -- Oral - Thin Cup -- Oral - Thin Straw -- Oral - Puree -- Oral - Mech Soft -- Oral - Regular -- Oral - Multi-Consistency -- Oral - Pill -- Oral Phase - Comment --  CHL IP PHARYNGEAL PHASE 04/07/2021 Pharyngeal Phase Impaired Pharyngeal-  Pudding Teaspoon -- Pharyngeal -- Pharyngeal- Pudding Cup -- Pharyngeal -- Pharyngeal- Honey Teaspoon -- Pharyngeal -- Pharyngeal- Honey Cup -- Pharyngeal -- Pharyngeal- Nectar Teaspoon Penetration/Aspiration before swallow;Penetration/Aspiration during swallow;Pharyngeal residue - pyriform;Reduced pharyngeal peristalsis;Reduced epiglottic inversion;Reduced anterior laryngeal mobility;Reduced laryngeal elevation;Reduced airway/laryngeal closure;Reduced tongue base retraction Pharyngeal Material enters airway, passes BELOW cords without attempt by patient to  eject out (silent aspiration) Pharyngeal- Nectar Cup -- Pharyngeal -- Pharyngeal- Nectar Straw -- Pharyngeal -- Pharyngeal- Thin Teaspoon Penetration/Aspiration before swallow;Penetration/Aspiration during swallow;Pharyngeal residue - pyriform;Reduced pharyngeal peristalsis Pharyngeal Material enters airway, passes BELOW cords without attempt by patient to eject out (silent aspiration) Pharyngeal- Thin Cup -- Pharyngeal -- Pharyngeal- Thin Straw -- Pharyngeal -- Pharyngeal- Puree Penetration/Aspiration before swallow;Penetration/Aspiration during swallow;Pharyngeal residue - pyriform;Reduced pharyngeal peristalsis;Reduced tongue base retraction;Reduced airway/laryngeal closure;Reduced epiglottic inversion;Reduced anterior laryngeal mobility;Reduced laryngeal elevation Pharyngeal Material enters airway, passes BELOW cords without attempt by patient to eject out (silent aspiration) Pharyngeal- Mechanical Soft -- Pharyngeal -- Pharyngeal- Regular -- Pharyngeal -- Pharyngeal- Multi-consistency -- Pharyngeal -- Pharyngeal- Pill -- Pharyngeal -- Pharyngeal Comment --  CHL IP CERVICAL ESOPHAGEAL PHASE 04/07/2021 Cervical Esophageal Phase Impaired Pudding Teaspoon -- Pudding Cup -- Honey Teaspoon -- Honey Cup -- Nectar Teaspoon Reduced cricopharyngeal relaxation Nectar Cup -- Nectar Straw -- Thin Teaspoon -- Thin Cup -- Thin Straw -- Puree -- Mechanical Soft -- Regular --  Multi-consistency -- Pill -- Cervical Esophageal Comment -- [place] Siren 04/07/2021, 3:00 PM              ECHOCARDIOGRAM COMPLETE  Result Date: 04/07/2021    ECHOCARDIOGRAM REPORT   Patient Name:   [name] Date of Exam: 04/07/2021 Medical Rec #:  [id number]        Height:       67.0 in Accession #:    [id number]       Weight:       94.8 lb Date of Birth:  1975-11-01        BSA:          1.472 m Patient Age:    62 years         BP:           108/78 mmHg Patient Gender: M                HR:           97 bpm. Exam Location:  Inpatient Procedure: 2D Echo, Cardiac Doppler and Color Doppler Indications:    CHF  History:        Patient has no prior history of Echocardiogram examinations.                 COPD; Risk Factors:Hypertension.  Sonographer:    Maudry Mayhew MHA, RDMS, RVT, RDCS Referring Phys: [place]  1. Left ventricular ejection fraction, by estimation, is 50 to 55%. The left ventricle has low normal function. The left ventricle has no regional wall motion abnormalities. Left ventricular diastolic parameters were normal.  2. Right ventricular systolic function is mildly reduced. The right ventricular size is normal. There is mildly elevated pulmonary artery systolic pressure. The estimated right ventricular systolic pressure is 72.0 mmHg.  3. The mitral valve is normal in structure. Trivial mitral valve regurgitation. No evidence of mitral stenosis.  4. The aortic valve was not well visualized. Aortic valve regurgitation is not visualized. Mild aortic valve sclerosis is present, with no evidence of aortic valve stenosis.  5. The inferior vena cava is normal in size with <50% respiratory variability, suggesting right atrial pressure of 8 mmHg. FINDINGS  Left Ventricle: Left ventricular ejection fraction, by estimation, is 50 to 55%. The left ventricle has low normal function. The left ventricle has no regional wall motion abnormalities. Definity contrast agent was  given IV to delineate the left ventricular endocardial borders. The left ventricular internal  cavity size was normal in size. There is no left ventricular hypertrophy. Left ventricular diastolic parameters were normal. Normal left ventricular filling pressure. Right Ventricle: The right ventricular size is normal. No increase in right ventricular wall thickness. Right ventricular systolic function is mildly reduced. There is mildly elevated pulmonary artery systolic pressure. The tricuspid regurgitant velocity  is 2.89 m/s, and with an assumed right atrial pressure of 8 mmHg, the estimated right ventricular systolic pressure is 54.6 mmHg. Left Atrium: Left atrial size was normal in size. Right Atrium: Right atrial size was normal in size. Pericardium: There is no evidence of pericardial effusion. Mitral Valve: The mitral valve is normal in structure. Mild mitral annular calcification. Trivial mitral valve regurgitation. No evidence of mitral valve stenosis. Tricuspid Valve: The tricuspid valve is normal in structure. Tricuspid valve regurgitation is trivial. No evidence of tricuspid stenosis. Aortic Valve: The aortic valve was not well visualized. Aortic valve regurgitation is not visualized. Mild aortic valve sclerosis is present, with no evidence of aortic valve stenosis. Aortic valve mean gradient measures 2.0 mmHg. Aortic valve peak gradient measures 4.4 mmHg. Aortic valve area, by VTI measures 4.82 cm. Pulmonic Valve: The pulmonic valve was normal in structure. Pulmonic valve regurgitation is not visualized. No evidence of pulmonic stenosis. Aorta: The aortic root is normal in size and structure. Venous: The inferior vena cava is normal in size with less than 50% respiratory variability, suggesting right atrial pressure of 8 mmHg. IAS/Shunts: No atrial level shunt detected by color flow Doppler.  LEFT VENTRICLE PLAX 2D LVIDd:         3.90 cm     Diastology LVIDs:         2.80 cm     LV e' medial:    9.75 cm/s  LV PW:         0.90 cm     LV E/e' medial:  9.0 LV IVS:        0.90 cm     LV e' lateral:   17.50 cm/s LVOT diam:     2.30 cm     LV E/e' lateral: 5.0 LV SV:         86 LV SV Index:   59 LVOT Area:     4.15 cm  LV Volumes (MOD) LV vol d, MOD A2C: 89.4 ml LV vol d, MOD A4C: 36.2 ml LV vol s, MOD A2C: 52.5 ml LV vol s, MOD A4C: 38.1 ml LV SV MOD A2C:     36.9 ml LV SV MOD A4C:     36.2 ml LV SV MOD BP:      12.9 ml RIGHT VENTRICLE RV S prime:     6.31 cm/s TAPSE (M-mode): 1.6 cm LEFT ATRIUM             Index       RIGHT ATRIUM           Index LA diam:        3.00 cm 2.04 cm/m  RA Area:     12.00 cm LA Vol (A2C):   43.6 ml 29.58 ml/m RA Volume:   30.60 ml  20.78 ml/m LA Vol (A4C):   28.2 ml 19.15 ml/m LA Biplane Vol: 40.2 ml 27.30 ml/m  AORTIC VALVE AV Area (Vmax):    2.83 cm AV Area (Vmean):   2.87 cm AV Area (VTI):     4.82 cm AV Vmax:           105.00 cm/s AV Vmean:  66.100 cm/s AV VTI:            0.179 m AV Peak Grad:      4.4 mmHg AV Mean Grad:      2.0 mmHg LVOT Vmax:         71.45 cm/s LVOT Vmean:        45.650 cm/s LVOT VTI:          0.208 m LVOT/AV VTI ratio: 1.16  AORTA Ao Root diam: 3.20 cm MITRAL VALVE               TRICUSPID VALVE MV Area (PHT): 2.76 cm    TR Peak grad:   33.4 mmHg MV Decel Time: 275 msec    TR Vmax:        289.00 cm/s MR Peak grad: 23.0 mmHg MR Vmax:      240.00 cm/s  SHUNTS MV E velocity: 87.30 cm/s  Systemic VTI:  0.21 m MV A velocity: 27.70 cm/s  Systemic Diam: 2.30 cm MV E/A ratio:  3.15 Fransico Him MD Electronically signed by Fransico Him MD Signature Date/Time: 04/07/2021/2:16:56 PM    Final         Scheduled Meds:  carBAMazepine  400 mg Per Tube BID   chlorhexidine  15 mL Mouth Rinse BID   Chlorhexidine Gluconate Cloth  6 each Topical Daily   doxycycline  100 mg Per Tube Q12H   feeding supplement (OSMOLITE 1.5 CAL)  237 mL Per Tube 5 X Daily   feeding supplement (PROSource TF)  45 mL Per Tube Daily   folic acid  1 mg Per Tube Daily   heparin  injection (subcutaneous)  5,000 Units Subcutaneous Q8H   ipratropium-albuterol  3 mL Nebulization Once   ipratropium-albuterol  3 mL Nebulization Once   ipratropium-albuterol  3 mL Nebulization Once   levETIRAcetam  1,000 mg Per Tube BID   mouth rinse  15 mL Mouth Rinse BID   multivitamin with minerals  1 tablet Per Tube Daily   sodium chloride flush  10-40 mL Intracatheter Q12H   thiamine  100 mg Per Tube Daily   Continuous Infusions:  cefTRIAXone (ROCEPHIN)  IV Stopped (04/07/21 1430)     LOS: 2 days   Time spent= 35 mins    Jaquelyne Firkus Arsenio Loader, MD [facility]  If 7PM-7AM, please contact night-coverage  04/08/2021, 7:55 AM

## 2021-04-08 NOTE — Progress Notes (Addendum)
eLink Physician-Brief Progress Note Patient Name: [name] DOB: [birth year] MRN: [id number]   Date of Service  04/08/2021  HPI/Events of Note  Pt c/o nausea. Qtc 520 ms. Has Ativan 1-2mg  IV PRN ordered. I also note he has been on NS mIVF throughout today and his last Na was 121 this morning. Was 121 yesterday morning and 111 the day prior.   eICU Interventions  OK to give Ativan 1mg  for nausea given prolonged Qtc. Will check a BMP now to reassess Na in setting of NS infusion.   ADDENDUM 04/08/21 10:19 PM - Na remains 121. - K 3.2 (down from 3.6). Normal renal function. - Plan: 40 mEq KCl IV and 40 mEq KCl per tube.  Intervention Category Minor Interventions: Other:  Charlott Rakes 04/08/2021, 9:17 PM

## 2021-04-08 NOTE — Progress Notes (Signed)
NAME:  [name], MRN:  [id number], DOB:  Nov 23, 1975, LOS: 2 ADMISSION DATE:  04/06/2021, CONSULTATION DATE: 04/06/2021 REFERRING MD: [facility] ED, CHIEF COMPLAINT: Hypoxia, elevated lactic acid, elevated troponins.  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  He underwent radiation and chemotherapy pleated in January 2022.  PET scan revealed hypermetabolic state of the epiglottis.  On 02/20/2021 he had biopsy by Dr. Benjamine Mola did not reveal any cancerous cells.  Of note he has continued to smoke, drink alcohol, and now presents to [facility] 04/06/2021 with multilobar pneumococcus pneumonia and right pneumothorax s/p chest tube placement  Pertinent  Medical History    has a past medical history of Back pain, Bradycardia (08/17/2011), Cancer ([place]), COPD (chronic obstructive pulmonary disease) ([place]), ETOH abuse, GERD (gastroesophageal reflux disease), Hypertension, Macrocytosis (08/17/2011), Pancreatitis, Pneumonia, Port-A-Cath in place (09/04/2020), SAH (subarachnoid hemorrhage) ([place]), and Seizures ([place]).   Significant Hospital Events: Including procedures, antibiotic start and stop dates in addition to other pertinent events   04/06/2021 right chest tube placed at [facility] for tension pneumothorax right. Antibiotics for pneumococcal PNA 6/11 transfer to [facility]  Interim History / Subjective:  No complaints of chest pain pain at chest tube site Objective   Blood pressure 125/68, pulse 97, temperature 98.7 F (37.1 C), temperature source Oral, resp. rate 20, height 5\' 7"  (1.702 m), weight 43 kg, SpO2 96 %.        Intake/Output Summary (Last 24 hours) at 04/08/2021 1541 Last data filed at 04/08/2021 0550 Gross per 24 hour  Intake 1004 ml  Output 1304 ml  Net -300 ml   Filed Weights   04/06/21 0240 04/06/21 0630  Weight: 44.5 kg 43 kg    Examination: Gen:      No acute distress,  malnourished, chronically ill HEENT:  EOMI, sclera anicteric Neck:     No masses; no thyromegaly Lungs:    Scattered rhonchi CV:         Regular rate and rhythm; no murmurs Abd:      + bowel sounds; soft, non-tender; no palpable masses, no distension Ext:    No edema; adequate peripheral perfusion Skin:      Warm and dry; no rash Neuro:, Somnolent, arousable  Labs/imaging that I havepersonally reviewed  (right click and "Reselect all SmartList Selections" daily)   Chest x-ray reviewed today bilateral infiltrates, tiny stable right apical pneumothorax  Resolved Hospital Problem list     Assessment & Plan:  Acute on chronic hypoxic respiratory failure secondary to multilobar Streptococcus pneumonia, right pneumothorax History of recent head neck cancer, severe COPD with continued tobacco abuse, reported alcohol abuse despite having PEG in place since 08/22/2020.   Chest tube has been to waterseal for over 24 hours with stable trace right apical pneumothorax I have have removed chest tube today and will follow-up with chest x-ray Continue antibiotics, wean on supplemental oxygen  Hyponatremia 109 on admission with last of sodium on 02/20/2021 of 125. 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  Further biopsy 02/20/2021 by Dr. Benjamine Mola with no malignant cells identified. Continue to monitor  Elevated cardiac enzyme noted to be 1439 most likely stress No evidence of acute coronary syndrome Continue to monitor  Monitor for etoh withdrawal Continue thiamine and folic acid  History of subarachnoid hemorrhage and seizure disorder Continue analeptics  Severe  protein malnourishment secondary to head neck cancer Dietary consult Tube feedings  PCCM will follow sign off if follow-up chest x-ray after chest tube removal looks okay  Best practice (right click and "Reselect all SmartList Selections"  daily)  Per primary team  Signature:   Marshell Garfinkel MD [facility] See Amion for pager  If no response to pager , please call [phone] until 7pm After 7:00 pm call Elink  [phone] 04/08/2021, 3:48 PM

## 2021-04-08 NOTE — Progress Notes (Signed)
Chest tube pulled by MD. Tolerated well. See vitals

## 2021-04-09 ENCOUNTER — Inpatient Hospital Stay (HOSPITAL_COMMUNITY): Payer: Medicaid Other

## 2021-04-09 LAB — COMPREHENSIVE METABOLIC PANEL
ALT: 139 U/L — ABNORMAL HIGH (ref 0–44)
AST: 73 U/L — ABNORMAL HIGH (ref 15–41)
Albumin: 2.1 g/dL — ABNORMAL LOW (ref 3.5–5.0)
Alkaline Phosphatase: 79 U/L (ref 38–126)
Anion gap: 6 (ref 5–15)
BUN: 5 mg/dL — ABNORMAL LOW (ref 6–20)
CO2: 31 mmol/L (ref 22–32)
Calcium: 8 mg/dL — ABNORMAL LOW (ref 8.9–10.3)
Chloride: 84 mmol/L — ABNORMAL LOW (ref 98–111)
Creatinine, Ser: <0.3 mg/dL — ABNORMAL LOW (ref 0.61–1.24)
Glucose, Bld: 105 mg/dL — ABNORMAL HIGH (ref 70–99)
Potassium: 4.3 mmol/L (ref 3.5–5.1)
Sodium: 121 mmol/L — ABNORMAL LOW (ref 135–145)
Total Bilirubin: 0.4 mg/dL (ref 0.3–1.2)
Total Protein: 5.4 g/dL — ABNORMAL LOW (ref 6.5–8.1)

## 2021-04-09 LAB — CBC
HCT: 30.5 % — ABNORMAL LOW (ref 39.0–52.0)
Hemoglobin: 10.6 g/dL — ABNORMAL LOW (ref 13.0–17.0)
MCH: 34.2 pg — ABNORMAL HIGH (ref 26.0–34.0)
MCHC: 34.8 g/dL (ref 30.0–36.0)
MCV: 98.4 fL (ref 80.0–100.0)
Platelets: 127 10*3/uL — ABNORMAL LOW (ref 150–400)
RBC: 3.1 MIL/uL — ABNORMAL LOW (ref 4.22–5.81)
RDW: 12 % (ref 11.5–15.5)
WBC: 12.2 10*3/uL — ABNORMAL HIGH (ref 4.0–10.5)
nRBC: 0 % (ref 0.0–0.2)

## 2021-04-09 LAB — MAGNESIUM: Magnesium: 1.4 mg/dL — ABNORMAL LOW (ref 1.7–2.4)

## 2021-04-09 MED ORDER — PIPERACILLIN-TAZOBACTAM 3.375 G IVPB
3.3750 g | Freq: Three times a day (TID) | INTRAVENOUS | Status: DC
  Administered 2021-04-09 – 2021-04-12 (×9): 3.375 g via INTRAVENOUS
  Filled 2021-04-09 (×9): qty 50

## 2021-04-09 MED ORDER — MAGNESIUM SULFATE 4 GM/100ML IV SOLN
4.0000 g | Freq: Once | INTRAVENOUS | Status: AC
  Administered 2021-04-09: 4 g via INTRAVENOUS
  Filled 2021-04-09: qty 100

## 2021-04-09 MED ORDER — IPRATROPIUM-ALBUTEROL 0.5-2.5 (3) MG/3ML IN SOLN
3.0000 mL | Freq: Three times a day (TID) | RESPIRATORY_TRACT | Status: DC
  Administered 2021-04-09 (×2): 3 mL via RESPIRATORY_TRACT
  Filled 2021-04-09 (×3): qty 3

## 2021-04-09 MED ORDER — FUROSEMIDE 10 MG/ML IJ SOLN
20.0000 mg | Freq: Once | INTRAMUSCULAR | Status: AC
  Administered 2021-04-09: 20 mg via INTRAVENOUS
  Filled 2021-04-09: qty 2

## 2021-04-09 NOTE — Evaluation (Signed)
Physical Therapy Evaluation Patient Details Name: [name] MRN: [id number] DOB: 13-Nov-1975 Today's Date: 04/09/2021   History of Present Illness  45 year old Admitted 04/06/21 with large right tension pneumothorax and sepsis secondary to multifocal pneumonia. Elevated troponins due to demand ischemia.  PMH squamous cell cancer of epiglottis in 2021 status post chemoradiation; COPD  Clinical Impression   Pt admitted secondary to problem above with deficits below. PTA patient was reportedly walking without a device (per pt) however this is highly questionable given his performance today.  Pt currently requires +2 max assist for ambulating 3 ft. Patient with difficulty coordinating turning and backing up to sit on chair and would have fallen without 2 person assist. Unclear level of assist cousin/family have been providing. Patient can benefit from PT to address the problems listed below.  Will continue to follow acutely to maximize functional mobility independence and safety.       Follow Up Recommendations SNF;Supervision/Assistance - 24 hour (if family can provide 24/7 care at max assist level could return home)    Equipment Recommendations  3in1 (PT);Wheelchair (measurements PT);Wheelchair cushion (measurements PT);Hospital bed (if pt were to discharge home)    Recommendations for Other Services       Precautions / Restrictions Precautions Precautions: Fall;Other (comment) Precaution Comments: Watch SpO2 and RR      Mobility  Bed Mobility Overal bed mobility: Needs Assistance Bed Mobility: Supine to Sit     Supine to sit: Min assist;HOB elevated     General bed mobility comments: Min A to hold therapist's hand and pull into upright posture. Requiring increased time and effort    Transfers Overall transfer level: Needs assistance Equipment used: 2 person hand held assist Transfers: Sit to/from [facility] Sit to Stand: Min assist;+2 physical  assistance Stand pivot transfers: Max assist;+2 physical assistance       General transfer comment: Min A +2 for power up into standing with two person hand held. Once pt turned to recliner, difficulty with backwards stepping and requiring Max A to block knees and faciltiate hips safely to recliner.  Ambulation/Gait Ambulation/Gait assistance: Max assist;+2 physical assistance Gait Distance (Feet): 3 Feet Assistive device: 2 person hand held assist Gait Pattern/deviations: Step-to pattern;Shuffle     General Gait Details: pt took side steps with +2 HHA min assist, howeve when needed to take several steps backwards toward chair, he could not coordinate stepping backwards and was max assist to reach chair  Stairs            Wheelchair Mobility    Modified Rankin (Stroke Patients Only)       Balance Overall balance assessment: Needs assistance Sitting-balance support: No upper extremity supported;Feet supported Sitting balance-Leahy Scale: Fair     Standing balance support: Bilateral upper extremity supported;During functional activity Standing balance-Leahy Scale: Poor Standing balance comment: Reliant on UE support                             Pertinent Vitals/Pain Pain Assessment: Faces Faces Pain Scale: Hurts a little bit Pain Location: neck; all over Pain Descriptors / Indicators: Discomfort Pain Intervention(s): Monitored during session;Repositioned    Home Living Family/patient expects to be discharged to:: Private residence Living Arrangements: Other relatives (cousin) Available Help at Discharge: Family Type of Home: Apartment Home Access: Level entry (ground floor apt)     Home Layout: One level Home Equipment: Wheelchair - manual (1L home O2; pt very fatigued and  SOB and unable to answer further questions) Additional Comments: Patient states he takes sponge baths and uses his BSC    Prior Function Level of Independence: Needs assistance    Gait / Transfers Assistance Needed: walks with no device (pt fatigued and unable to answer more details re: activity and assist); appears very weak and likely needing assist  ADL's / Homemaking Assistance Needed: cousin cooks and cleans; assists pt with dressing        Hand Dominance        Extremity/Trunk Assessment   Upper Extremity Assessment Upper Extremity Assessment: Defer to OT evaluation    Lower Extremity Assessment Lower Extremity Assessment: Generalized weakness (legs frossly 3+ to 4/5 with poor muscular endurance)    Cervical / Trunk Assessment Cervical / Trunk Assessment: Kyphotic  Communication   Communication: Other (comment) (SOB and soft spoken)  Cognition Arousal/Alertness: Lethargic Behavior During Therapy: Flat affect Overall Cognitive Status: Difficult to assess                                 General Comments: Decreased arousal level. Moments of awakeness and then eyes rolling back/"zonning out" while answering questions. Pt following one steps commands during bed mobility and transfers. Oriented to place and year; too fatigued to ask further questions.      General Comments General comments (skin integrity, edema, etc.): Upon arrival, SpO2 89% and RR 35 with pt's nasal canuala under his chin. Placing pt back on 4L and SpO2 rising to 95%; RR 28 and HR 100s. During activity, SpO2 maintaining >89% on 4L and RR 30-40s. End of session, SpO2 96% on 4L, RR 28, and HR 100s.    Exercises     Assessment/Plan    PT Assessment Patient needs continued PT services  PT Problem List Decreased strength;Decreased activity tolerance;Decreased balance;Decreased mobility;Decreased coordination;Decreased cognition;Decreased knowledge of use of DME;Cardiopulmonary status limiting activity       PT Treatment Interventions DME instruction;Gait training;Functional mobility training;Therapeutic activities;Therapeutic exercise;Balance training;Patient/family  education    PT Goals (Current goals can be found in the Care Plan section)  Acute Rehab PT Goals Patient Stated Goal: Agreeable to OOB to chair PT Goal Formulation: With patient Time For Goal Achievement: 04/23/21 Potential to Achieve Goals: Good    Frequency Min 3X/week   Barriers to discharge Decreased caregiver support unclear level of assist cousin can provide    Co-evaluation               AM-PAC PT "6 Clicks" Mobility  Outcome Measure Help needed turning from your back to your side while in a flat bed without using bedrails?: A Little Help needed moving from lying on your back to sitting on the side of a flat bed without using bedrails?: A Little Help needed moving to and from a bed to a chair (including a wheelchair)?: A Little Help needed standing up from a chair using your arms (e.g., wheelchair or bedside chair)?: A Little Help needed to walk in hospital room?: Total Help needed climbing 3-5 steps with a railing? : Total 6 Click Score: 14    End of Session Equipment Utilized During Treatment: Oxygen Activity Tolerance: Patient limited by fatigue Patient left: in chair;with call bell/phone within reach;with chair alarm set Nurse Communication: Mobility status PT Visit Diagnosis: Unsteadiness on feet (R26.81);Muscle weakness (generalized) (M62.81);Other abnormalities of gait and mobility (R26.89)    Time: 4098-1191 PT Time Calculation (min) (ACUTE ONLY): 26  min   Charges:   PT Evaluation $PT Eval Moderate Complexity: 1 Mod           Arby Barrette, PT Pager [phone]   Rexanne Mano 04/09/2021, 10:10 AM

## 2021-04-09 NOTE — Progress Notes (Signed)
eLink Physician-Brief Progress Note Patient Name: [name] DOB: May 20, 1976 MRN: [id number]   Date of Service  04/09/2021  HPI/Events of Note  Mg 1.4  eICU Interventions  Ordered 4 g magnesium IV     Intervention Category Minor Interventions: Electrolytes abnormality - evaluation and management  Charlott Rakes 04/09/2021, 6:53 AM

## 2021-04-09 NOTE — Progress Notes (Signed)
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  Chest tube catheter was placed and transferred to [facility].  Initially also hyponatremic thought to be due to alcohol use but improved spontaneously.   Assessment & Plan:   Principal Problem:   Tension pneumothorax Active Problems:   COPD (chronic obstructive pulmonary disease) (HCC)   HTN (hypertension)   Respiratory failure (HCC)   Pressure injury of skin   Protein-calorie malnutrition, severe   Acute on chronic hypoxic respiratory failure secondary to right-sided tension pneumothorax - Status post chest tube placement 6/9.  Pulmonary team following.  Chest tube removed 6/11.  Follow-up chest x-ray stable. - Wean oxygen as appropriate - Out of bed to chair.  I-S/flutter - Nebs as needed  Aspiration pneumonia, worsening.  Multifocal - Initially suspicion of chronic aspiration. - Antibiotics-continue doxycycline.  Add Zosyn. - I-S/flutter - Bronchodilators scheduled and as needed -Stop IV fluids.  Lasix 20 mg IV once.  Hyponatremia, 121.  Around his baseline - Suspect due to alcohol use.    Elevated troponin - Suspect secondary to demand ischemia.  No evidence of ACS.  Continue to monitor. 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  Supportive care.  Monitor LFTs.  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 Benjamine Mola.  Supportive care  History of subarachnoid hemorrhage and  seizure disorder - On Tegretol and Keppra  Severe protein calorie malnutrition - Dietitian following.  On tube feeds. -Seen by speech.  Continues to be n.p.o. Nutritional status  Nutrition Problem: Severe Malnutrition Etiology: chronic illness (epiglottis cancer s/p chemotherapy and radiation, EtOH abus)  Signs/Symptoms: severe muscle depletion, severe fat depletion, percent weight loss (14.5% weight loss in less than 7 months) Percent weight loss: 14.5 %  Interventions: MVI, Prostat, Tube feeding   DVT prophylaxis: Subcu heparin Code Status: Full code Family Communication:  [place] 336 [phone]. No answer  Status is: Inpatient    Dispo: The patient is from: Home              Anticipated d/c is to: Home              Patient currently is not medically stable to d/c.  Chest tube out, awaiting PT/OT. Hopefully home in next 24-48 hrs.    Difficult to place patient No  Body mass index is 14.85 kg/m.  Pressure Injury 04/06/21 Coccyx Mid Stage 1 -  Intact skin with non-blanchable redness of a localized area usually over a bony prominence. stage 1 sacrum (Active)  04/06/21 0630  Location: Coccyx  Location Orientation: Mid  Staging: Stage 1 -  Intact skin with non-blanchable redness of a localized area usually over a bony prominence.  Wound Description (Comments): stage 1 sacrum  Present on Admission: Yes    Subjective: Sitting up in the chair coughing quite a bit.  Exertional dyspnea.  Review of Systems Otherwise negative except as per HPI, including: General = no fevers, chills, dizziness,  fatigue HEENT/EYES = negative for loss of vision, double vision, blurred vision,  sore throa Cardiovascular= negative for chest pain, palpitation Respiratory/lungs= negative for shortness of breath, cough, wheezing; hemoptysis,  Gastrointestinal= negative for nausea, vomiting, abdominal pain Genitourinary= negative for Dysuria MSK = Negative for arthralgia, myalgias Neurology=  Negative for headache, numbness, tingling  Psychiatry= Negative for suicidal and homocidal ideation Skin= Negative for Rash    Examination:  Constitutional: Not in acute distress, b/l temporal wasting. cachectic Respiratory: b/l coarse BS Cardiovascular: Normal sinus rhythm, no rubs Abdomen: Nontender nondistended good bowel sounds Musculoskeletal: No edema noted Skin: No rashes seen Neurologic: CN 2-12 grossly intact.  And nonfocal Psychiatric: Normal judgment and insight. Alert and oriented x 3. Normal mood.   PEG tube in place Chest tube in place  Objective: Vitals:   04/09/21 0000 04/09/21 0152 04/09/21 0400 04/09/21 0700  BP: (!) 146/87  135/78   Pulse: 98  100 96  Resp: (!) 25  (!) 22 (!) 24  Temp: 97.8 F (36.6 C)  98.2 F (36.8 C) 98.4 F (36.9 C)  TempSrc: Oral  Axillary Axillary  SpO2: 93% 98% 98% 97%  Weight:      Height:        Intake/Output Summary (Last 24 hours) at 04/09/2021 0802 Last data filed at 04/09/2021 0555 Gross per 24 hour  Intake 2440.96 ml  Output 3325 ml  Net -884.04 ml   Filed Weights   04/06/21 0240 04/06/21 0630  Weight: 44.5 kg 43 kg     Data Reviewed:   CBC: Recent Labs  Lab 04/06/21 0233 04/07/21 0243 04/09/21 0233  WBC 23.2* 17.0* 12.2*  NEUTROABS  --  15.2*  --   HGB 13.1 11.2* 10.6*  HCT 38.0* 31.0* 30.5*  MCV 99.5 95.4 98.4  PLT 220 148* 027*   Basic Metabolic Panel: Recent Labs  Lab 04/06/21 0849 04/06/21 1107 04/07/21 0243 04/07/21 2028 04/08/21 0343 04/08/21 2117 04/09/21 0233  NA 111*  --  121*  --  121* 121* 121*  K 3.1*  --  3.2*  --  3.6 3.1* 4.3  CL 70*  --  79*  --  79* 82* 84*  CO2 28  --  32  --  29 32 31  GLUCOSE 147*  --  115*  --  110* 128* 105*  BUN <5*  --  <5*  --  10 8 5*  CREATININE 0.41*  --  <0.30*  --  0.32* <0.30* <0.30*  CALCIUM 7.4*  --  8.0*  --  8.3* 7.6* 8.0*  MG  --  1.5* 1.9  --  1.9  --  1.4*  PHOS  --  2.4* 1.6* 2.2* 1.9*  --   --    GFR: CrCl cannot be calculated  (This lab value cannot be used to calculate CrCl because it is not a number: <0.30). Liver Function Tests: Recent Labs  Lab 04/06/21 0233 04/06/21 0849 04/07/21 0243 04/09/21 0233  AST 35 1,484* 666* 73*  ALT 14 472* 345* 139*  ALKPHOS 129* 87 66 79  BILITOT 0.6 0.5 0.5 0.4  PROT 7.1 5.4* 5.5* 5.4*  ALBUMIN 3.0* 2.4* 2.3* 2.1*   No results for input(s): LIPASE, AMYLASE in the last 168 hours. No results for input(s): AMMONIA in the last 168 hours. Coagulation Profile: Recent Labs  Lab 04/06/21 0233  INR 1.2   Cardiac Enzymes: No results for input(s): CKTOTAL, CKMB, CKMBINDEX, TROPONINI in the last 168 hours. BNP (last 3 results) No results for input(s): PROBNP in the last 8760 hours. HbA1C: No results for input(s):  HGBA1C in the last 72 hours. CBG: Recent Labs  Lab 04/06/21 2319 04/07/21 0359 04/07/21 0739 04/07/21 1145 04/07/21 1613  GLUCAP 259* 84 135* 248* 194*   Lipid Profile: No results for input(s): CHOL, HDL, LDLCALC, TRIG, CHOLHDL, LDLDIRECT in the last 72 hours. Thyroid Function Tests: Recent Labs    04/08/21 0803  TSH 1.867   Anemia Panel: No results for input(s): VITAMINB12, FOLATE, FERRITIN, TIBC, IRON, RETICCTPCT in the last 72 hours. Sepsis Labs: Recent Labs  Lab 04/06/21 0233 04/06/21 0403 04/06/21 0849 04/07/21 0243 04/08/21 0343  PROCALCITON  --   --  5.38 3.37 1.49  LATICACIDVEN 7.0* 4.5* 3.2*  --   --     Recent Results (from the past 240 hour(s))  Culture, blood (single) w Reflex to ID Panel     Status: None (Preliminary result)   Collection Time: 04/06/21  2:33 AM   Specimen: BLOOD  Result Value Ref Range Status   Specimen Description BLOOD PORTA CATH  Final   Special Requests   Final    BOTTLES DRAWN AEROBIC AND ANAEROBIC Blood Culture adequate volume   Culture   Final    NO GROWTH 2 DAYS Performed at [facility], [address], [place], [place] [address]    Report Status PENDING  Incomplete  Resp Panel by RT-PCR (Flu  A&B, Covid) Nasopharyngeal Swab     Status: None   Collection Time: 04/06/21  4:04 AM   Specimen: Nasopharyngeal Swab; Nasopharyngeal(NP) swabs in vial transport medium  Result Value Ref Range Status   SARS Coronavirus 2 by RT PCR NEGATIVE NEGATIVE Final    Comment: (NOTE) SARS-CoV-2 target nucleic acids are NOT DETECTED.  The SARS-CoV-2 RNA is generally detectable in upper respiratory specimens during the acute phase of infection. The lowest concentration of SARS-CoV-2 viral copies this assay can detect is 138 copies/mL. A negative result does not preclude SARS-Cov-2 infection and should not be used as the sole basis for treatment or other patient management decisions. A negative result may occur with  improper specimen collection/handling, submission of specimen other than nasopharyngeal swab, presence of viral mutation(s) within the areas targeted by this assay, and inadequate number of viral copies(<138 copies/mL). A negative result must be combined with clinical observations, patient history, and epidemiological information. The expected result is Negative.  Fact Sheet for Patients:  EntrepreneurPulse.com.au  Fact Sheet for Healthcare Providers:  IncredibleEmployment.be  This test is no t yet approved or cleared by the [place] FDA and  has been authorized for detection and/or diagnosis of SARS-CoV-2 by FDA under an Emergency Use Authorization (EUA). This EUA will remain  in effect (meaning this test can be used) for the duration of the COVID-19 declaration under Section 564(b)(1) of the Act, 21 U.S.C.section 360bbb-3(b)(1), unless the authorization is terminated  or revoked sooner.       Influenza A by PCR NEGATIVE NEGATIVE Final   Influenza B by PCR NEGATIVE NEGATIVE Final    Comment: (NOTE) The Xpert Xpress SARS-CoV-2/FLU/RSV plus assay is intended as an aid in the diagnosis of influenza from Nasopharyngeal swab specimens  and should not be used as a sole basis for treatment. Nasal washings and aspirates are unacceptable for Xpert Xpress SARS-CoV-2/FLU/RSV testing.  Fact Sheet for Patients: EntrepreneurPulse.com.au  Fact Sheet for Healthcare Providers: IncredibleEmployment.be  This test is not yet approved or cleared by the [place] FDA and has been authorized for detection and/or diagnosis of SARS-CoV-2 by FDA under an Emergency Use Authorization (EUA). This EUA  will remain in effect (meaning this test can be used) for the duration of the COVID-19 declaration under Section 564(b)(1) of the Act, 21 U.S.C. section 360bbb-3(b)(1), unless the authorization is terminated or revoked.  Performed at [facility], [address] [place], [place] [address]   MRSA PCR Screening     Status: None   Collection Time: 04/06/21  6:31 AM   Specimen: Nasal Mucosa; Nasopharyngeal  Result Value Ref Range Status   MRSA by PCR NEGATIVE NEGATIVE Final    Comment:        The GeneXpert MRSA Assay (FDA approved for NASAL specimens only), is one component of a comprehensive MRSA colonization surveillance program. It is not intended to diagnose MRSA infection nor to guide or monitor treatment for MRSA infections. Performed at [facility], [place] [address], [place], [place] [address]          Radiology Studies: DG CHEST PORT 1 VIEW  Result Date: 04/08/2021 CLINICAL DATA:  Acute respiratory failure EXAM: PORTABLE CHEST 1 VIEW COMPARISON:  April 08, 2021 FINDINGS: The cardiomediastinal silhouette is unchanged in contour.LEFT chest port with tip terminating over the superior cavoatrial junction. Removal of RIGHT-sided chest tube. There is a small RIGHT hydropneumothorax, similar in comparison to prior. Persistent heterogeneous opacification of the LEFT lung. Unchanged interstitial opacities throughout the RIGHT lung base. Visualized abdomen is unremarkable. Multilevel  degenerative changes of the thoracic spine. IMPRESSION: Small RIGHT hydropneumothorax, similar in comparison to prior status post RIGHT chest tube removal. Electronically Signed   By: Valentino Saxon MD   On: 04/08/2021 16:35   DG Chest Port 1 View  Result Date: 04/08/2021 CLINICAL DATA:  Abnormal respirations. Recent pneumothorax with right-sided chest tube in place. EXAM: PORTABLE CHEST 1 VIEW COMPARISON:  Chest x-ray 04/07/2021; PET-CT 01/09/2021 FINDINGS: Left subclavian approach single-lumen power injectable port catheter in good position with the tip overlying the superior cavoatrial junction. Right-sided thoracostomy tube in good position overlying the right mid lung. Stable trace residual right apical pneumothorax. Overall inspiratory volumes are low. Progressive patchy and confluent areas of mixed interstitial and airspace opacity throughout the left lung and also developing in the right lower lobe. Trace right pleural effusion. Cardiac and mediastinal contours remain within normal limits. Left apical pleuroparenchymal scarring and bullous change similar compared to prior. No acute osseous abnormality. IMPRESSION: 1. Lower inspiratory volumes with worsening bilateral (left worse than right) patchy foci of mixed interstitial and airspace opacities throughout the entire left lung and within the base of the right lung. Findings raise concern for multifocal versus atypical/viral pneumonia. 2. Stable trace right apical pneumothorax with well-positioned right-sided pigtail thoracostomy tube. Electronically Signed   By: Jacqulynn Cadet M.D.   On: 04/08/2021 09:59   DG CHEST PORT 1 VIEW  Result Date: 04/07/2021 CLINICAL DATA:  Follow-up right-sided pneumothorax EXAM: PORTABLE CHEST 1 VIEW COMPARISON:  Film from earlier in the same day. FINDINGS: Pigtail catheter is again noted on the right. Small apical pneumothorax is again seen and stable. Patchy airspace opacities are identified throughout both  lungs consistent with multifocal pneumonia. Left chest wall port is again seen. Cardiac shadow is stable. IMPRESSION: No significant interval change from the prior exam. Electronically Signed   By: Inez Catalina M.D.   On: 04/07/2021 16:28   DG Swallowing Func-Speech Pathology  Result Date: 04/07/2021 Formatting of this result is different from the original. Objective Swallowing Evaluation: Type of Study: MBS-Modified Barium Swallow Study  Patient Details Name: SARTHAK RUBENSTEIN MRN: [id number]  Date of Birth: 1976-05-02 Today's Date: 04/07/2021 Time: SLP Start Time (ACUTE ONLY): 26 -SLP Stop Time (ACUTE ONLY): 1340 SLP Time Calculation (min) (ACUTE ONLY): 21 min Past Medical History: Past Medical History: Diagnosis Date  Back pain   Bradycardia 08/17/2011  Cancer (HCC)   COPD (chronic obstructive pulmonary disease) (HCC)   ETOH abuse   GERD (gastroesophageal reflux disease)   Hypertension   Macrocytosis 08/17/2011  Pancreatitis   Pneumonia   Port-A-Cath in place 09/04/2020  SAH (subarachnoid hemorrhage) ([place])   Seizures ([place])   last one 6 mos ago Past Surgical History: Past Surgical History: Procedure Laterality Date  BACK SURGERY    DIRECT LARYNGOSCOPY N/A 02/20/2021  Procedure: DIRECT LARYNGOSCOPY WITH BIOPSY;  Surgeon: Leta Baptist, MD;  Location: [facility] OR;  Service: ENT;  Laterality: N/A;  ESOPHAGOGASTRODUODENOSCOPY (EGD) WITH PROPOFOL N/A 08/14/2017  Procedure: ESOPHAGOGASTRODUODENOSCOPY (EGD) WITH PROPOFOL;  Surgeon: Daneil Dolin, MD;  Location: [facility] ENDO SUITE;  Service: Endoscopy;  Laterality: N/A;  1:30pm  ESOPHAGOGASTRODUODENOSCOPY (EGD) WITH PROPOFOL N/A 03/13/2018  Procedure: ESOPHAGOGASTRODUODENOSCOPY (EGD) WITH PROPOFOL;  Surgeon: Daneil Dolin, MD;  Location: [facility] END[facility] SUITE;  Service: Endoscopy;  Laterality: N/A;  2:30pm  ESOPHAGOGASTRODUODENOSCOPY (EGD) WITH PROPOFOL N/A 08/22/2020  Procedure: ESOPHAGOGASTRODUODENOSCOPY (EGD) WITH PROPOFOL;  Surgeon: Aviva Signs, MD;  Location: [facility] ORS;  Service: General;   Laterality: N/A;  PEG PLACEMENT N/A 08/22/2020  Procedure: PERCUTANEOUS ENDOSCOPIC GASTROSTOMY (PEG) PLACEMENT;  Surgeon: Aviva Signs, MD;  Location: [facility] ORS;  Service: General;  Laterality: N/A;  PORTACATH PLACEMENT Left 08/22/2020  Procedure: INSERTION PORT-A-CATH;  Surgeon: Aviva Signs, MD;  Location: [facility] ORS;  Service: General;  Laterality: Left;  SEPTOPLASTY   HPI: 45 year-old male who was 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  He underwent radiation and chemotherapy completed 10/2020. PEG placed 07/2020.  Per chart PET scan revealed hypermetabolic state of the epiglottis. hypermetabolic state of the epiglottis. Direct laryngoscopy 02/20/21 with ENT revealed partial erosion of the epiglottis, with necrotic debris covering the rest of the epiglottis, biopsy taken did not reveal any cancerous cells. Pt presented to [facility] increasing shortness of breath. Chest x-ray with possible multifocal pneumonia. CXR revealed Large right tension pneumothorax. Multifocal pulmonary infiltrates, possibly with foci of cavitation  most in keeping with multifocal possibly necrotizing pneumonia. MBS 08/23/20 (prior to treatment) revealed penetration and aspiration after swallow without awareness with wet vocal quality and throat clearing, residue greater with nectar. Rec'd Dys 3, thin, cough post swallow, dysphagia treatment with swallow exercises.  No data recorded Assessment / Plan / Recommendation CHL IP CLINICAL IMPRESSIONS 04/07/2021 Clinical Impression Pt exhibits severe pharyngeal dysphagia significantly worsened since radiation/chemo treatement. MBS 07/2020 and today's were compared revealing gross aspiration of thin, nectar and puree consistencies barium. His epiglottis was not appreciated during today's study cooresponding to results of direct laryngoscopy 4/45/22 which revealed partial erosion of the epiglottis, with necrotic debris. There was no movement of hyoid during swallows with thin, nectar  and minimal anterior elevation. Barium directed into laryngeal vestiuble onto vocal cords and aspirated silently with thin and nectar without awareness. Minimal barium was able to pass UES into esophagus with thin. Maximum puree residue at pyriform sinuses that fell onto vocal cords initiating a throat clear that propelled back to oral cavity to be suctioned. Therapist cued pt to hold breath when material in oral cavity just prior to swallow. This was challenging for pt and he was unable to demonstrate. It is recommended that he continue NPO status using  PEG for nutrition allowing ice chips after oral care intermittently.He is significantly malnourished with inadequate reserve. Recommend thorough oral care. Palliative care may be beneficial for pt. SLP can each pt pharyngeal exercises to decrease laryngeal/pharyngeal atrophy and fibrotic tissue. SLP Visit Diagnosis -- Attention and concentration deficit following -- Frontal lobe and executive function deficit following -- Impact on safety and function --   CHL IP TREATMENT RECOMMENDATION 04/06/2021 Treatment Recommendations Therapy as outlined in treatment plan below   Prognosis 04/06/2021 Prognosis for Safe Diet Advancement (No Data) Barriers to Reach Goals (No Data) Barriers/Prognosis Comment -- No flowsheet data found.  No flowsheet data found.  CHL IP FOLLOW UP RECOMMENDATIONS 04/06/2021 Follow up Recommendations Other (comment)   CHL IP FREQUENCY AND DURATION 04/06/2021 Speech Therapy Frequency (ACUTE ONLY) min 2x/week Treatment Duration 2 weeks      CHL IP ORAL PHASE 04/07/2021 Oral Phase WFL Oral - Pudding Teaspoon -- Oral - Pudding Cup -- Oral - Honey Teaspoon -- Oral - Honey Cup -- Oral - Nectar Teaspoon -- Oral - Nectar Cup -- Oral - Nectar Straw -- Oral - Thin Teaspoon -- Oral - Thin Cup -- Oral - Thin Straw -- Oral - Puree -- Oral - Mech Soft -- Oral - Regular -- Oral - Multi-Consistency -- Oral - Pill -- Oral Phase - Comment --  CHL IP PHARYNGEAL PHASE 04/07/2021  Pharyngeal Phase Impaired Pharyngeal- Pudding Teaspoon -- Pharyngeal -- Pharyngeal- Pudding Cup -- Pharyngeal -- Pharyngeal- Honey Teaspoon -- Pharyngeal -- Pharyngeal- Honey Cup -- Pharyngeal -- Pharyngeal- Nectar Teaspoon Penetration/Aspiration before swallow;Penetration/Aspiration during swallow;Pharyngeal residue - pyriform;Reduced pharyngeal peristalsis;Reduced epiglottic inversion;Reduced anterior laryngeal mobility;Reduced laryngeal elevation;Reduced airway/laryngeal closure;Reduced tongue base retraction Pharyngeal Material enters airway, passes BELOW cords without attempt by patient to eject out (silent aspiration) Pharyngeal- Nectar Cup -- Pharyngeal -- Pharyngeal- Nectar Straw -- Pharyngeal -- Pharyngeal- Thin Teaspoon Penetration/Aspiration before swallow;Penetration/Aspiration during swallow;Pharyngeal residue - pyriform;Reduced pharyngeal peristalsis Pharyngeal Material enters airway, passes BELOW cords without attempt by patient to eject out (silent aspiration) Pharyngeal- Thin Cup -- Pharyngeal -- Pharyngeal- Thin Straw -- Pharyngeal -- Pharyngeal- Puree Penetration/Aspiration before swallow;Penetration/Aspiration during swallow;Pharyngeal residue - pyriform;Reduced pharyngeal peristalsis;Reduced tongue base retraction;Reduced airway/laryngeal closure;Reduced epiglottic inversion;Reduced anterior laryngeal mobility;Reduced laryngeal elevation Pharyngeal Material enters airway, passes BELOW cords without attempt by patient to eject out (silent aspiration) Pharyngeal- Mechanical Soft -- Pharyngeal -- Pharyngeal- Regular -- Pharyngeal -- Pharyngeal- Multi-consistency -- Pharyngeal -- Pharyngeal- Pill -- Pharyngeal -- Pharyngeal Comment --  CHL IP CERVICAL ESOPHAGEAL PHASE 04/07/2021 Cervical Esophageal Phase Impaired Pudding Teaspoon -- Pudding Cup -- Honey Teaspoon -- Honey Cup -- Nectar Teaspoon Reduced cricopharyngeal relaxation Nectar Cup -- Nectar Straw -- Thin Teaspoon -- Thin Cup -- Thin Straw --  Puree -- Mechanical Soft -- Regular -- Multi-consistency -- Pill -- Cervical Esophageal Comment -- [place] Siren 04/07/2021, 3:00 PM              ECHOCARDIOGRAM COMPLETE  Result Date: 04/07/2021    ECHOCARDIOGRAM REPORT   Patient Name:   KOUPER SPINELLA Date of Exam: 04/07/2021 Medical Rec #:  [id number]        Height:       67.0 in Accession #:    [id number]       Weight:       94.8 lb Date of Birth:  12-14-1975        BSA:          1.472 m Patient Age:    9 years  BP:           108/78 mmHg Patient Gender: M                HR:           97 bpm. Exam Location:  Inpatient Procedure: 2D Echo, Cardiac Doppler and Color Doppler Indications:    CHF  History:        Patient has no prior history of Echocardiogram examinations.                 COPD; Risk Factors:Hypertension.  Sonographer:    Maudry Mayhew MHA, RDMS, RVT, RDCS Referring Phys: [place]  1. Left ventricular ejection fraction, by estimation, is 50 to 55%. The left ventricle has low normal function. The left ventricle has no regional wall motion abnormalities. Left ventricular diastolic parameters were normal.  2. Right ventricular systolic function is mildly reduced. The right ventricular size is normal. There is mildly elevated pulmonary artery systolic pressure. The estimated right ventricular systolic pressure is 06.3 mmHg.  3. The mitral valve is normal in structure. Trivial mitral valve regurgitation. No evidence of mitral stenosis.  4. The aortic valve was not well visualized. Aortic valve regurgitation is not visualized. Mild aortic valve sclerosis is present, with no evidence of aortic valve stenosis.  5. The inferior vena cava is normal in size with <50% respiratory variability, suggesting right atrial pressure of 8 mmHg. FINDINGS  Left Ventricle: Left ventricular ejection fraction, by estimation, is 50 to 55%. The left ventricle has low normal function. The left ventricle has no regional wall motion  abnormalities. Definity contrast agent was given IV to delineate the left ventricular endocardial borders. The left ventricular internal cavity size was normal in size. There is no left ventricular hypertrophy. Left ventricular diastolic parameters were normal. Normal left ventricular filling pressure. Right Ventricle: The right ventricular size is normal. No increase in right ventricular wall thickness. Right ventricular systolic function is mildly reduced. There is mildly elevated pulmonary artery systolic pressure. The tricuspid regurgitant velocity  is 2.89 m/s, and with an assumed right atrial pressure of 8 mmHg, the estimated right ventricular systolic pressure is 01.6 mmHg. Left Atrium: Left atrial size was normal in size. Right Atrium: Right atrial size was normal in size. Pericardium: There is no evidence of pericardial effusion. Mitral Valve: The mitral valve is normal in structure. Mild mitral annular calcification. Trivial mitral valve regurgitation. No evidence of mitral valve stenosis. Tricuspid Valve: The tricuspid valve is normal in structure. Tricuspid valve regurgitation is trivial. No evidence of tricuspid stenosis. Aortic Valve: The aortic valve was not well visualized. Aortic valve regurgitation is not visualized. Mild aortic valve sclerosis is present, with no evidence of aortic valve stenosis. Aortic valve mean gradient measures 2.0 mmHg. Aortic valve peak gradient measures 4.4 mmHg. Aortic valve area, by VTI measures 4.82 cm. Pulmonic Valve: The pulmonic valve was normal in structure. Pulmonic valve regurgitation is not visualized. No evidence of pulmonic stenosis. Aorta: The aortic root is normal in size and structure. Venous: The inferior vena cava is normal in size with less than 50% respiratory variability, suggesting right atrial pressure of 8 mmHg. IAS/Shunts: No atrial level shunt detected by color flow Doppler.  LEFT VENTRICLE PLAX 2D LVIDd:         3.90 cm     Diastology LVIDs:          2.80 cm     LV e' medial:    9.75  cm/s LV PW:         0.90 cm     LV E/e' medial:  9.0 LV IVS:        0.90 cm     LV e' lateral:   17.50 cm/s LVOT diam:     2.30 cm     LV E/e' lateral: 5.0 LV SV:         86 LV SV Index:   59 LVOT Area:     4.15 cm  LV Volumes (MOD) LV vol d, MOD A2C: 89.4 ml LV vol d, MOD A4C: 36.2 ml LV vol s, MOD A2C: 52.5 ml LV vol s, MOD A4C: 38.1 ml LV SV MOD A2C:     36.9 ml LV SV MOD A4C:     36.2 ml LV SV MOD BP:      12.9 ml RIGHT VENTRICLE RV S prime:     6.31 cm/s TAPSE (M-mode): 1.6 cm LEFT ATRIUM             Index       RIGHT ATRIUM           Index LA diam:        3.00 cm 2.04 cm/m  RA Area:     12.00 cm LA Vol (A2C):   43.6 ml 29.58 ml/m RA Volume:   30.60 ml  20.78 ml/m LA Vol (A4C):   28.2 ml 19.15 ml/m LA Biplane Vol: 40.2 ml 27.30 ml/m  AORTIC VALVE AV Area (Vmax):    2.83 cm AV Area (Vmean):   2.87 cm AV Area (VTI):     4.82 cm AV Vmax:           105.00 cm/s AV Vmean:          66.100 cm/s AV VTI:            0.179 m AV Peak Grad:      4.4 mmHg AV Mean Grad:      2.0 mmHg LVOT Vmax:         71.45 cm/s LVOT Vmean:        45.650 cm/s LVOT VTI:          0.208 m LVOT/AV VTI ratio: 1.16  AORTA Ao Root diam: 3.20 cm MITRAL VALVE               TRICUSPID VALVE MV Area (PHT): 2.76 cm    TR Peak grad:   33.4 mmHg MV Decel Time: 275 msec    TR Vmax:        289.00 cm/s MR Peak grad: 23.0 mmHg MR Vmax:      240.00 cm/s  SHUNTS MV E velocity: 87.30 cm/s  Systemic VTI:  0.21 m MV A velocity: 27.70 cm/s  Systemic Diam: 2.30 cm MV E/A ratio:  3.15 Fransico Him MD Electronically signed by Fransico Him MD Signature Date/Time: 04/07/2021/2:16:56 PM    Final         Scheduled Meds:  carBAMazepine  400 mg Per Tube BID   chlorhexidine  15 mL Mouth Rinse BID   Chlorhexidine Gluconate Cloth  6 each Topical Daily   doxycycline  100 mg Per Tube Q12H   feeding supplement (OSMOLITE 1.5 CAL)  237 mL Per Tube 5 X Daily   feeding supplement (PROSource TF)  45 mL Per Tube Daily   folic  acid  1 mg Per Tube Daily   heparin injection (subcutaneous)  5,000 Units Subcutaneous Q8H   levETIRAcetam  1,000 mg  Per Tube BID   mouth rinse  15 mL Mouth Rinse BID   multivitamin with minerals  1 tablet Per Tube Daily   sodium chloride flush  10-40 mL Intracatheter Q12H   thiamine  100 mg Per Tube Daily   Continuous Infusions:  sodium chloride 75 mL/hr at 04/09/21 0554   cefTRIAXone (ROCEPHIN)  IV 1 g (04/08/21 1027)   magnesium sulfate bolus IVPB       LOS: 3 days   Time spent= 35 mins    Zaiyah Sottile Arsenio Loader, MD [facility]  If 7PM-7AM, please contact night-coverage  04/09/2021, 8:02 AM

## 2021-04-09 NOTE — Progress Notes (Signed)
Pharmacy Antibiotic Note  [name] is a 45 y.o. male admitted on 04/06/2021 with necrotizing pneumonia seen on CXR. Patient initially on doxycycline + cefepime. Antibiotics were then narrowed to doxycycline + ceftriaxone. Patient now out of ICU but with continued worsening pneumonia noted. Pharmacy has now been consulted for Zosyn dosing.   Plan: Stop ceftriaxone  Continue doxycyline  Start Zosyn 3.375g IV q8h (EI)   Height: 5\' 7"  (170.2 cm) Weight: 43 kg (94 lb 12.8 oz) IBW/kg (Calculated) : 66.1  Temp (24hrs), Avg:98.3 F (36.8 C), Min:97.8 F (36.6 C), Max:98.7 F (37.1 C)  Recent Labs  Lab 04/06/21 0233 04/06/21 0403 04/06/21 0849 04/07/21 0243 04/08/21 0343 04/08/21 2117 04/09/21 0233  WBC 23.2*  --   --  17.0*  --   --  12.2*  CREATININE <0.30*  --  0.41* <0.30* 0.32* <0.30* <0.30*  LATICACIDVEN 7.0* 4.5* 3.2*  --   --   --   --      CrCl cannot be calculated (This lab value cannot be used to calculate CrCl because it is not a number: <0.30).    No Known Allergies  Antimicrobials this admission: Cefepime 6/9 >> 6/10 Ceftriaxone 6/10 >> 6/12 Doxy 6/9 >> Zosyn 6/12 >>   Microbiology results: 6/9 BCx: ngtd 6/9 MRSA PCR: negative   Thank you for allowing pharmacy to be a part of this patient's care.  Claudina Lick, PharmD PGY1 Acute Care Pharmacy Resident 04/09/2021 11:10 AM  Please check [email or web address] for unit-specific pharmacy phone numbers.

## 2021-04-09 NOTE — Plan of Care (Signed)

## 2021-04-09 NOTE — Evaluation (Signed)
Occupational Therapy Evaluation Patient Details Name: [name] MRN: [id number] DOB: 1976/06/08 Today's Date: 04/09/2021    History of Present Illness 45 year old Admitted 04/06/21 with large right tension pneumothorax and sepsis secondary to multifocal pneumonia. Elevated troponins due to demand ischemia.  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 Difficulty collecting information as pt fatigued quickly with conversation and answering questions. Pt currently requiring Min-Mod A for UB ADLs, Max A for LB ADLs, and Min-Max A +2 for functional mobility. Pt presenting with poor balance, strength, cognition, arousal, and activity tolerance. Pt quickly fatigues and requiring significant time and effort throughout. Pt would benefit from further acute OT to facilitate safe dc. Recommend dc to SNF for further OT to optimize safety, independence with ADLs, and return to PLOF.   Upon arrival, SpO2 89% and RR 35 with pt's nasal canuala under his chin.  At rest, SpO2 95% on 4L, RR 28, and HR 100s.  During activity, SpO2 maintaining >89% on 4L and RR 30-40s.  End of session, SpO2 96% on 4L, RR 28, and HR 100s.    Follow Up Recommendations  SNF    Equipment Recommendations  Other (comment) (Defer to next venue)    Recommendations for Other Services PT consult     Precautions / Restrictions Precautions Precautions: Fall;Other (comment) Precaution Comments: Watch SpO2 and RR      Mobility Bed Mobility Overal bed mobility: Needs Assistance Bed Mobility: Supine to Sit     Supine to sit: Min assist;HOB elevated     General bed mobility comments: Min A to hold therapist's hand and pull into upright posture. Requiring increased time and effort    Transfers Overall  transfer level: Needs assistance Equipment used: 2 person hand held assist Transfers: Sit to/from [employer] Sit to Stand: Min assist;+2 physical assistance Stand pivot transfers: Min assist;Max assist;+2 physical assistance       General transfer comment: Min A +2 for power up into standing with two person hand held. Once pt turned to recliner, difficulty with backwards stepping and requiring Max A to block knees and faciltiate hips safely to recliner.    Balance Overall balance assessment: Needs assistance Sitting-balance support: No upper extremity supported;Feet supported Sitting balance-Leahy Scale: Fair     Standing balance support: Bilateral upper extremity supported;During functional activity Standing balance-Leahy Scale: Poor Standing balance comment: Reliant on UE support                           ADL either performed or assessed with clinical judgement   ADL Overall ADL's : Needs assistance/impaired Eating/Feeding: NPO   Grooming: Wash/dry face;Set up;Supervision/safety;Sitting   Upper Body Bathing: Minimal assistance;Sitting   Lower Body Bathing: Maximal assistance;Sit to/from stand   Upper Body Dressing : Moderate assistance;Sitting   Lower Body Dressing: Minimal assistance;Bed level;Maximal assistance Lower Body Dressing Details (indicate cue type and reason): Pt presenting with poor FM skills and strength; difficulty locating end of sock and then placing thumbs inside. Pt dropping sock demonstrating poor grasp. Min A for initating donning socks. Pt then using figure four to pull sock over heel with significant time and effort.  RR elevating to 47 with figure four position in bed. Max A for dynamic stanidng  balance Toilet Transfer: Minimal assistance;Maximal assistance;+2 for physical assistance;Stand-pivot (simulated to recliner) Toilet Transfer Details (indicate cue type and reason): Min A +2 for power up into standing with two person  hand held. Once pt turned to recliner, difficulty with backwards stepping and requiring Max A to block knees and faciltiate hips safely to recliner.         Functional mobility during ADLs: Minimal assistance;+2 for physical assistance (stand pivot only) General ADL Comments: Pt with poor strength, balance, coorindation, and activity tolerance. Fatigues VERY quickly. RR elevating with simple movement.     Vision         Perception     Praxis      Pertinent Vitals/Pain Pain Assessment: Faces Faces Pain Scale: Hurts a little bit Pain Location: neck; all over Pain Descriptors / Indicators: Discomfort Pain Intervention(s): Monitored during session;Limited activity within patient's tolerance;Repositioned     Hand Dominance     Extremity/Trunk Assessment Upper Extremity Assessment Upper Extremity Assessment: Generalized weakness   Lower Extremity Assessment Lower Extremity Assessment: Generalized weakness   Cervical / Trunk Assessment Cervical / Trunk Assessment: Kyphotic   Communication Communication Communication: Other (comment) (SOB and soft spoken)   Cognition Arousal/Alertness: Lethargic Behavior During Therapy: Flat affect Overall Cognitive Status: Difficult to assess                                 General Comments: Decreased arousal level. Moments of awakeness and then eyes rolling back/"zonning out" while answering questions. Pt following one steps commands during bed mobility and transfers. Oriented to place and year; too fatigued to ask further questions.   General Comments  Upon arrival, SpO2 89% and RR 35 with pt's nasal canuala under his chin. Placing pt back on 4L and SpO2 rising to 95%; RR 28 and HR 100s. During activity, SpO2 maintaining >89% on 4L and RR 30-40s. End of session, SpO2 96% on 4L, RR 28, and HR 100s.    Exercises     Shoulder Instructions      Home Living Family/patient expects to be discharged to:: Private  residence Living Arrangements: Other relatives (cousin) Available Help at Discharge: Family Type of Home: Apartment Home Access: Level entry (ground floor apt)     Home Layout: One level     Bathroom Shower/Tub: Teacher, early years/pre: Standard     Home Equipment: Wheelchair - manual (1L home O2; pt very fatigued and SOB and unable to answer further questions)          Prior Functioning/Environment Level of Independence: Needs assistance  Gait / Transfers Assistance Needed: walks with no device (pt fatigued and unable to answer more details re: activity and assist); appears very weak and likely needing assist ADL's / Homemaking Assistance Needed: cousin cooks and cleans; assists pt with dressing            OT Problem List: Decreased strength;Decreased range of motion;Impaired balance (sitting and/or standing);Decreased activity tolerance;Decreased coordination;Decreased cognition;Decreased safety awareness;Decreased knowledge of use of DME or AE;Decreased knowledge of precautions;Cardiopulmonary status limiting activity      OT Treatment/Interventions: Self-care/ADL training;Therapeutic exercise;Energy conservation;DME and/or AE instruction;Therapeutic activities;Patient/family education    OT Goals(Current goals can be found in the care plan section) Acute Rehab OT Goals Patient Stated Goal: Agreeable to OOB to chair OT Goal Formulation: With patient Time For Goal Achievement: 04/23/21 Potential to Achieve Goals: Good  OT Frequency: Min 2X/week  Barriers to D/C:            Co-evaluation              AM-PAC OT "6 Clicks" Daily Activity     Outcome Measure Help from another person eating meals?: Total Help from another person taking care of personal grooming?: A Little Help from another person toileting, which includes using toliet, bedpan, or urinal?: A Lot Help from another person bathing (including washing, rinsing, drying)?: A Lot Help from  another person to put on and taking off regular upper body clothing?: A Lot Help from another person to put on and taking off regular lower body clothing?: A Lot 6 Click Score: 12   End of Session Equipment Utilized During Treatment: Gait belt;Oxygen (4L) Nurse Communication: Mobility status;Other (comment) (SPO2)  Activity Tolerance: Patient limited by fatigue Patient left: in chair;with call bell/phone within reach;with chair alarm set  OT Visit Diagnosis: Unsteadiness on feet (R26.81);Other abnormalities of gait and mobility (R26.89);Muscle weakness (generalized) (M62.81)                Time: 6808-8110 OT Time Calculation (min): 29 min Charges:  OT General Charges $OT Visit: 1 Visit OT Evaluation $OT Eval Moderate Complexity: [place], OTR/L Acute Rehab Pager: [phone] Office: [place] 04/09/2021, 9:30 AM

## 2021-04-10 ENCOUNTER — Inpatient Hospital Stay (HOSPITAL_COMMUNITY): Payer: Medicaid Other

## 2021-04-10 DIAGNOSIS — J410 Simple chronic bronchitis: Secondary | ICD-10-CM

## 2021-04-10 DIAGNOSIS — R64 Cachexia: Secondary | ICD-10-CM

## 2021-04-10 DIAGNOSIS — J13 Pneumonia due to Streptococcus pneumoniae: Secondary | ICD-10-CM

## 2021-04-10 LAB — COMPREHENSIVE METABOLIC PANEL
ALT: 85 U/L — ABNORMAL HIGH (ref 0–44)
AST: 43 U/L — ABNORMAL HIGH (ref 15–41)
Albumin: 2 g/dL — ABNORMAL LOW (ref 3.5–5.0)
Alkaline Phosphatase: 79 U/L (ref 38–126)
Anion gap: 8 (ref 5–15)
BUN: 6 mg/dL (ref 6–20)
CO2: 35 mmol/L — ABNORMAL HIGH (ref 22–32)
Calcium: 8 mg/dL — ABNORMAL LOW (ref 8.9–10.3)
Chloride: 78 mmol/L — ABNORMAL LOW (ref 98–111)
Creatinine, Ser: <0.3 mg/dL — ABNORMAL LOW (ref 0.61–1.24)
Glucose, Bld: 102 mg/dL — ABNORMAL HIGH (ref 70–99)
Potassium: 2.4 mmol/L — CL (ref 3.5–5.1)
Sodium: 121 mmol/L — ABNORMAL LOW (ref 135–145)
Total Bilirubin: 0.2 mg/dL — ABNORMAL LOW (ref 0.3–1.2)
Total Protein: 5.6 g/dL — ABNORMAL LOW (ref 6.5–8.1)

## 2021-04-10 LAB — BASIC METABOLIC PANEL
Anion gap: 9 (ref 5–15)
BUN: 6 mg/dL (ref 6–20)
CO2: 33 mmol/L — ABNORMAL HIGH (ref 22–32)
Calcium: 8.5 mg/dL — ABNORMAL LOW (ref 8.9–10.3)
Chloride: 79 mmol/L — ABNORMAL LOW (ref 98–111)
Creatinine, Ser: <0.3 mg/dL — ABNORMAL LOW (ref 0.61–1.24)
Glucose, Bld: 103 mg/dL — ABNORMAL HIGH (ref 70–99)
Potassium: 3.6 mmol/L (ref 3.5–5.1)
Sodium: 121 mmol/L — ABNORMAL LOW (ref 135–145)

## 2021-04-10 LAB — CBC
HCT: 33.5 % — ABNORMAL LOW (ref 39.0–52.0)
Hemoglobin: 11.7 g/dL — ABNORMAL LOW (ref 13.0–17.0)
MCH: 33.2 pg (ref 26.0–34.0)
MCHC: 34.9 g/dL (ref 30.0–36.0)
MCV: 95.2 fL (ref 80.0–100.0)
Platelets: 115 10*3/uL — ABNORMAL LOW (ref 150–400)
RBC: 3.52 MIL/uL — ABNORMAL LOW (ref 4.22–5.81)
RDW: 12 % (ref 11.5–15.5)
WBC: 10 10*3/uL (ref 4.0–10.5)
nRBC: 0 % (ref 0.0–0.2)

## 2021-04-10 LAB — BRAIN NATRIURETIC PEPTIDE: B Natriuretic Peptide: 1886 pg/mL — ABNORMAL HIGH (ref 0.0–100.0)

## 2021-04-10 LAB — MAGNESIUM: Magnesium: 1.7 mg/dL (ref 1.7–2.4)

## 2021-04-10 MED ORDER — OXYCODONE-ACETAMINOPHEN 5-325 MG PO TABS
1.0000 | ORAL_TABLET | Freq: Three times a day (TID) | ORAL | Status: DC | PRN
Start: 2021-04-10 — End: 2021-04-12
  Administered 2021-04-10 – 2021-04-12 (×5): 1
  Filled 2021-04-10 (×5): qty 1

## 2021-04-10 MED ORDER — REVEFENACIN 175 MCG/3ML IN SOLN
175.0000 ug | Freq: Every day | RESPIRATORY_TRACT | Status: DC
  Administered 2021-04-11 – 2021-04-12 (×2): 175 ug via RESPIRATORY_TRACT
  Filled 2021-04-10 (×3): qty 3

## 2021-04-10 MED ORDER — POTASSIUM CHLORIDE 10 MEQ/100ML IV SOLN
10.0000 meq | INTRAVENOUS | Status: AC
  Administered 2021-04-10: 10 meq via INTRAVENOUS

## 2021-04-10 MED ORDER — ARFORMOTEROL TARTRATE 15 MCG/2ML IN NEBU
15.0000 ug | INHALATION_SOLUTION | Freq: Two times a day (BID) | RESPIRATORY_TRACT | Status: DC
  Administered 2021-04-10 – 2021-04-12 (×4): 15 ug via RESPIRATORY_TRACT
  Filled 2021-04-10 (×4): qty 2

## 2021-04-10 MED ORDER — ALBUMIN HUMAN 25 % IV SOLN
25.0000 g | Freq: Four times a day (QID) | INTRAVENOUS | Status: AC
  Administered 2021-04-10 – 2021-04-11 (×4): 25 g via INTRAVENOUS
  Filled 2021-04-10 (×5): qty 100

## 2021-04-10 MED ORDER — POTASSIUM CHLORIDE 20 MEQ PO PACK
40.0000 meq | PACK | Freq: Once | ORAL | Status: AC
  Administered 2021-04-10: 40 meq
  Filled 2021-04-10: qty 2

## 2021-04-10 MED ORDER — POTASSIUM CHLORIDE 10 MEQ/100ML IV SOLN
10.0000 meq | INTRAVENOUS | Status: AC
  Administered 2021-04-10 (×3): 10 meq via INTRAVENOUS
  Filled 2021-04-10 (×4): qty 100

## 2021-04-10 NOTE — Progress Notes (Signed)
Physical Therapy Treatment Patient Details Name: [name] MRN: [id number] DOB: [birth year] Today's Date: 04/10/2021    History of Present Illness 45 year old Admitted 04/06/21 with large right tension pneumothorax and sepsis secondary to multifocal pneumonia. Elevated troponins due to demand ischemia.  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 Pt was able to ambulate however needing max to mod assist to walk with RW with pt flexed at knees and trunk needing cues and assist for safety.  Pt could not stay close to RW even with max cues and assist. Pt needs mod to max assist for safety and is very impulsive as well with poor safety awareness. Able to ambulate but needing a lot of support.  Pt currently with functional limitations due to balance and endurance deficits. Pt will benefit from skilled PT to increase their independence and safety with mobility to allow discharge to the venue listed below.      Follow Up Recommendations  SNF;Supervision/Assistance - 24 hour (if family can provide 24/7 care at max assist level could return home)     Equipment Recommendations  3in1 (PT);Wheelchair (measurements PT);Wheelchair cushion (measurements PT);Hospital bed (if pt were to discharge home)    Recommendations for Other Services       Precautions / Restrictions Precautions Precautions: Fall;Other (comment) Precaution Comments: Watch SpO2 and RR Restrictions Weight Bearing Restrictions: No    Mobility  Bed Mobility Overal bed mobility: Needs Assistance Bed Mobility: Supine to Sit     Supine to sit: Min assist;HOB elevated     General bed mobility comments: Min A to hold therapist's hand and pull into upright posture. Requiring increased time and effort    Transfers Overall transfer level: Needs assistance Equipment used: Rolling walker (2 wheeled) Transfers: Sit to/from [employer] Sit to Stand: Min assist;Mod assist         General transfer comment: mod assist  for power up into standing to RW and pt not achieving full upright posture. Max cues for hand placement and pt stays flexed at trunk and knees.  Ambulation/Gait Ambulation/Gait assistance: Max assist;Min assist;Mod assist Gait Distance (Feet): 35 Feet Assistive device: Rolling walker (2 wheeled) Gait Pattern/deviations: Step-to pattern;Shuffle;Decreased stride length;Decreased step length - right;Decreased step length - left;Staggering left;Staggering right;Drifts right/left;Trunk flexed;Wide base of support   Gait velocity interpretation: <1.31 ft/sec, indicative of household ambulator General Gait Details: Pt needed max cues and assist and even with this could not stay close enough to RW without a lot of assist and cues. Pt maintains flexed posture at trunk and bil LEs.  Pt walked quickly to sink and immediately turned on water and put his mouth under the faucet and got water and then spit it with phlegm into the sink, into the trashcan and on the floor. Did not appear to swallow any. Sister present and fussing at pt. Pt impulsive and could not get him to move away from the sink until he got the water and spit it out. Pt continued to walk after that but needed alot of assist for safety.Very poor safety awareness.   Stairs             Wheelchair Mobility    Modified Rankin (Stroke Patients Only)       Balance Overall balance assessment: Needs assistance Sitting-balance support: No upper extremity supported;Feet supported Sitting balance-Leahy Scale: Fair     Standing balance support: Bilateral  upper extremity supported;During functional activity Standing balance-Leahy Scale: Poor Standing balance comment: Reliant on UE support and external support                            Cognition Arousal/Alertness: Awake/alert Behavior During Therapy: Flat affect Overall Cognitive  Status: Impaired/Different from baseline Area of Impairment: Following commands;Safety/judgement;Problem solving                       Following Commands: Follows one step commands with increased time Safety/Judgement: Decreased awareness of safety;Decreased awareness of deficits   Problem Solving: Slow processing;Decreased initiation;Difficulty sequencing;Requires verbal cues;Requires tactile cues        Exercises      General Comments General comments (skin integrity, edema, etc.): Pt was on 3LO2 with sats >90%.  With ambulation on 3L, desat to 86% therefore had to incr to 4L to keep sats >88%.  Other vSS.      Pertinent Vitals/Pain Pain Assessment: No/denies pain    Home Living                      Prior Function            PT Goals (current goals can now be found in the care plan section) Progress towards PT goals: Progressing toward goals    Frequency    Min 3X/week      PT Plan Current plan remains appropriate    Co-evaluation              AM-PAC PT "6 Clicks" Mobility   Outcome Measure  Help needed turning from your back to your side while in a flat bed without using bedrails?: A Little Help needed moving from lying on your back to sitting on the side of a flat bed without using bedrails?: A Little Help needed moving to and from a bed to a chair (including a wheelchair)?: A Lot Help needed standing up from a chair using your arms (e.g., wheelchair or bedside chair)?: A Lot Help needed to walk in hospital room?: A Lot Help needed climbing 3-5 steps with a railing? : Total 6 Click Score: 13    End of Session Equipment Utilized During Treatment: Oxygen;Gait belt Activity Tolerance: Patient limited by fatigue Patient left: in chair;with call bell/phone within reach;with chair alarm set;with family/visitor present Nurse Communication: Mobility status PT Visit Diagnosis: Unsteadiness on feet (R26.81);Muscle weakness (generalized)  (M62.81);Other abnormalities of gait and mobility (R26.89)     Time: 1447-1520 PT Time Calculation (min) (ACUTE ONLY): 33 min  Charges:  $Gait Training: 8-22 mins $Therapeutic Activity: 8-22 mins                     Kimbrely Buckel M,PT Acute Rehab Services [phone] (pager)    Alvira Philips 04/10/2021, 4:07 PM

## 2021-04-10 NOTE — Progress Notes (Signed)
  Speech Language Pathology Treatment: Dysphagia  Patient Details Name: [name] MRN: [id number] DOB: [birth year] Today's Date: 04/10/2021 Time: 3833-3832 SLP Time Calculation (min) (ACUTE ONLY): 25 min  Assessment / Plan / Recommendation Clinical Impression  [name] demonstrates ongoing decline with severely impaired swallow function. He was lethargic throughout this session, but participatory. Oral care was provided with use of suction; trace white coating on lingual body. He is being treated for thrush. He has very congested cough and breath quality at baseline and was noted with ice water at bedside. He was seen with very small sips of water with immediate congested cough response. He was provided ice chip via tsp with cough response as well. Pt was provided handout of swallowing exercises and they were explained to him, specifically, effortful swallows with a great deal of reps. He nodded understanding, but upon leaving room, SLP asked, "what is the most important exercise for when you go home?" And pt was unable to recall any of the exercises discussed. Incentive spirometry was utilized to address cough response. Pt with max inhaled volume of 500 mL x5 trials. He was unable to hold breath despite max cues. Given severity of weakness, he does not appear appropriate for use of expiratory muscle strengthening device. Recommend palliative consult. ST service to follow for exercise as pt is able.    HPI HPI: 45 year-old male who was 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  He underwent radiation and chemotherapy completed 10/2020. PEG placed 07/2020.  Per chart PET scan revealed hypermetabolic state of the epiglottis. hypermetabolic state of the epiglottis. Direct laryngoscopy 02/20/21 with ENT revealed partial erosion of the epiglottis, with necrotic debris covering the rest of the epiglottis, biopsy taken did not reveal any cancerous cells. Pt presented to [facility]  increasing shortness of breath. Chest x-ray with possible multifocal pneumonia. CXR revealed Large right tension pneumothorax. Multifocal pulmonary infiltrates, possibly with foci of cavitation  most in keeping with multifocal possibly necrotizing pneumonia. MBS 08/23/20 (prior to treatment) revealed penetration and aspiration after swallow without awareness with wet vocal quality and throat clearing, residue greater with nectar. Rec'd Dys 3, thin, cough post swallow, dysphagia treatment with swallow exercises.      SLP Plan  Continue with current plan of care       Recommendations  Diet recommendations: NPO Medication Administration: Via alternative means                Oral Care Recommendations: Oral care QID Follow up Recommendations: Other (comment) (TBD) SLP Visit Diagnosis: Dysphagia, unspecified (R13.10) Plan: Continue with current plan of care                   [name] P. [name], M.S., [place] Pathologist Acute Rehabilitation Services Pager: [phone] 04/10/2021, 11:16 AM

## 2021-04-10 NOTE — Plan of Care (Signed)
Patient remains on 7L HF. Sats remain 95%. Occassional confusion,  pulling off O2.. Safety precausions maintained.

## 2021-04-10 NOTE — Progress Notes (Signed)
CSW introduced self to pt, [name] (significant other) and Hailey (niece).  They are expecting another family member and waiting on the family meeting.  Pt recommended for SNF but is pending palliative consult. CSW will continue to follow for discharge needs. Lurline Idol, MSW, LCSW 6/13/20221:35 PM

## 2021-04-10 NOTE — Progress Notes (Signed)
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  Chest tube catheter was placed and transferred to [facility].  Initially also hyponatremic thought to be due to alcohol use but improved spontaneously.   Assessment & Plan:   Principal Problem:   Tension pneumothorax Active Problems:   COPD (chronic obstructive pulmonary disease) (HCC)   HTN (hypertension)   Respiratory failure (HCC)   Pressure injury of skin   Protein-calorie malnutrition, severe   Acute on chronic hypoxic respiratory failure secondary to right-sided tension pneumothorax - Status post chest tube placement 6/9.  Chest tube removed 6/11.  Chest x-ray shows slight worsening infiltrate. - Wean oxygen as appropriate - Out of bed to chair.  I-S/flutter - Nebs 3 times daily and as needed  Aspiration pneumonia, worsening.  Multifocal - Initially suspicion of chronic aspiration. - Antibiotics-continue Zosyn and doxycycline - I-S/flutter - Bronchodilators scheduled and as needed  Hypokalemia - Aggressive repletion.  Hypoalbuminemia - Likely third spacing.  Will give albumin 25% every 6 hours X5 doses  Hyponatremia, 121.  Around his baseline - Suspect due to alcohol use.    Elevated troponin - Suspect secondary to demand ischemia.  No evidence of ACS.  Continue to monitor. 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  Supportive care.  Monitor LFTs.  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 Benjamine Mola.  Supportive care  History of subarachnoid hemorrhage and seizure disorder - On Tegretol and Keppra  Severe protein calorie malnutrition - Dietitian following.  On tube feeds. -Seen by speech.  Continues to be n.p.o.   After long goals of care discussion-family and patient has decided they would want full scope of medical treatment and wants to remain full code.  Given his advanced medical condition and poor nutritional status, overall he has poor prognosis therefore will also consult palliative care team.   Nutritional status  Nutrition Problem: Severe Malnutrition Etiology: chronic illness (epiglottis cancer s/p chemotherapy and radiation, EtOH abus)  Signs/Symptoms: severe muscle depletion, severe fat depletion, percent weight loss (14.5% weight loss in less than 7 months) Percent weight loss: 14.5 %  Interventions: MVI, Prostat, Tube feeding   DVT prophylaxis: Subcu heparin Code Status: Full code Family Communication: Spoke with his niece Museum/gallery conservator over the phone again today.  Status is: Inpatient    Dispo: The patient is from: Home              Anticipated d/c is to: Home              Patient currently is not medically stable to d/c.  Chest tube out, awaiting PT/OT. Hopefully home in next 24-48 hrs.    Difficult to place patient No  Body mass index is 14.85 kg/m.  Pressure Injury 04/06/21 Coccyx Mid Stage 1 -  Intact skin with non-blanchable redness of a localized area usually over a bony prominence. stage 1 sacrum (Active)  04/06/21 0630  Location: Coccyx  Location Orientation: Mid  Staging: Stage 1 -  Intact skin with non-blanchable redness of a  localized area usually over a bony prominence.  Wound Description (Comments): stage 1 sacrum  Present on Admission: Yes    Subjective: Sitting up in the bed getting his tube feeds via peg. He is coughing a little. Continue to have poor effort on incentive spirometer.  He does not appear  to be motivated at all.  I explained to him that if he continues to lay in bed and does not participate in therapy, use a spirometer and flutter valve is pneumonia/atelectasis will worsen and his breathing will deteriorate.  I also emphasized this to his family.  Review of Systems Otherwise negative except as per HPI, including: General = no fevers, chills, dizziness,  fatigue HEENT/EYES = negative for loss of vision, double vision, blurred vision,  sore throa Cardiovascular= negative for chest pain, palpitation Respiratory/lungs= negative for hemoptysis Gastrointestinal= negative for nausea, vomiting, abdominal pain Genitourinary= negative for Dysuria MSK = Negative for arthralgia, myalgias Neurology= Negative for headache, numbness, tingling  Psychiatry= Negative for suicidal and homocidal ideation Skin= Negative for Rash  Examination: Constitutional: Not in acute distress, temporal wasting. cachectic Respiratory: b/l rhonchi Cardiovascular: Normal sinus rhythm, no rubs Abdomen: Nontender nondistended good bowel sounds Musculoskeletal: No edema noted Skin: No rashes seen Neurologic: CN 2-12 grossly intact.  And nonfocal Psychiatric: Normal judgment and insight. Alert and oriented x 3. Normal mood.   PEG tube in place Chest tube in place  Objective: Vitals:   04/09/21 1923 04/09/21 2357 04/10/21 0003 04/10/21 0400  BP:  (!) 156/81 (!) 156/81 126/61  Pulse:  (!) 116 90 (!) 101  Resp:  20  19  Temp:  98.4 F (36.9 C)  98.3 F (36.8 C)  TempSrc:  Oral  Oral  SpO2: 96% 95%  98%  Weight:      Height:        Intake/Output Summary (Last 24 hours) at 04/10/2021 0831 Last data filed at 04/10/2021 0437 Gross per 24 hour  Intake 2446.06 ml  Output 2700 ml  Net -253.94 ml   Filed Weights   04/06/21 0240 04/06/21 0630  Weight: 44.5 kg 43 kg     Data Reviewed:   CBC: Recent Labs  Lab 04/06/21 0233 04/07/21 0243 04/09/21 0233 04/10/21 0718  WBC 23.2* 17.0* 12.2*  10.0  NEUTROABS  --  15.2*  --   --   HGB 13.1 11.2* 10.6* 11.7*  HCT 38.0* 31.0* 30.5* 33.5*  MCV 99.5 95.4 98.4 95.2  PLT 220 148* 127* 235*   Basic Metabolic Panel: Recent Labs  Lab 04/06/21 1107 04/07/21 0243 04/07/21 2028 04/08/21 0343 04/08/21 2117 04/09/21 0233 04/10/21 0718  NA  --  121*  --  121* 121* 121* 121*  K  --  3.2*  --  3.6 3.1* 4.3 2.4*  CL  --  79*  --  79* 82* 84* 78*  CO2  --  32  --  29 32 31 35*  GLUCOSE  --  115*  --  110* 128* 105* 102*  BUN  --  <5*  --  10 8 5* 6  CREATININE  --  <0.30*  --  0.32* <0.30* <0.30* <0.30*  CALCIUM  --  8.0*  --  8.3* 7.6* 8.0* 8.0*  MG 1.5* 1.9  --  1.9  --  1.4* 1.7  PHOS 2.4* 1.6* 2.2* 1.9*  --   --   --    GFR: CrCl cannot be calculated (This lab value cannot be used to calculate CrCl because it is not  a number: <0.30). Liver Function Tests: Recent Labs  Lab 04/06/21 0233 04/06/21 0849 04/07/21 0243 04/09/21 0233 04/10/21 0718  AST 35 1,484* 666* 73* 43*  ALT 14 472* 345* 139* 85*  ALKPHOS 129* 87 66 79 79  BILITOT 0.6 0.5 0.5 0.4 0.2*  PROT 7.1 5.4* 5.5* 5.4* 5.6*  ALBUMIN 3.0* 2.4* 2.3* 2.1* 2.0*   No results for input(s): LIPASE, AMYLASE in the last 168 hours. No results for input(s): AMMONIA in the last 168 hours. Coagulation Profile: Recent Labs  Lab 04/06/21 0233  INR 1.2   Cardiac Enzymes: No results for input(s): CKTOTAL, CKMB, CKMBINDEX, TROPONINI in the last 168 hours. BNP (last 3 results) No results for input(s): PROBNP in the last 8760 hours. HbA1C: No results for input(s): HGBA1C in the last 72 hours. CBG: Recent Labs  Lab 04/06/21 2319 04/07/21 0359 04/07/21 0739 04/07/21 1145 04/07/21 1613  GLUCAP 259* 84 135* 248* 194*   Lipid Profile: No results for input(s): CHOL, HDL, LDLCALC, TRIG, CHOLHDL, LDLDIRECT in the last 72 hours. Thyroid Function Tests: Recent Labs    04/08/21 0803  TSH 1.867   Anemia Panel: No results for input(s): VITAMINB12, FOLATE, FERRITIN, TIBC,  IRON, RETICCTPCT in the last 72 hours. Sepsis Labs: Recent Labs  Lab 04/06/21 0233 04/06/21 0403 04/06/21 0849 04/07/21 0243 04/08/21 0343  PROCALCITON  --   --  5.38 3.37 1.49  LATICACIDVEN 7.0* 4.5* 3.2*  --   --     Recent Results (from the past 240 hour(s))  Culture, blood (single) w Reflex to ID Panel     Status: None (Preliminary result)   Collection Time: 04/06/21  2:33 AM   Specimen: BLOOD  Result Value Ref Range Status   Specimen Description BLOOD PORTA CATH  Final   Special Requests   Final    BOTTLES DRAWN AEROBIC AND ANAEROBIC Blood Culture adequate volume   Culture   Final    NO GROWTH 3 DAYS Performed at [facility], [address], [place], [place] [address]    Report Status PENDING  Incomplete  Resp Panel by RT-PCR (Flu A&B, Covid) Nasopharyngeal Swab     Status: None   Collection Time: 04/06/21  4:04 AM   Specimen: Nasopharyngeal Swab; Nasopharyngeal(NP) swabs in vial transport medium  Result Value Ref Range Status   SARS Coronavirus 2 by RT PCR NEGATIVE NEGATIVE Final    Comment: (NOTE) SARS-CoV-2 target nucleic acids are NOT DETECTED.  The SARS-CoV-2 RNA is generally detectable in upper respiratory specimens during the acute phase of infection. The lowest concentration of SARS-CoV-2 viral copies this assay can detect is 138 copies/mL. A negative result does not preclude SARS-Cov-2 infection and should not be used as the sole basis for treatment or other patient management decisions. A negative result may occur with  improper specimen collection/handling, submission of specimen other than nasopharyngeal swab, presence of viral mutation(s) within the areas targeted by this assay, and inadequate number of viral copies(<138 copies/mL). A negative result must be combined with clinical observations, patient history, and epidemiological information. The expected result is Negative.  Fact Sheet for Patients:  EntrepreneurPulse.com.au  Fact  Sheet for Healthcare Providers:  IncredibleEmployment.be  This test is no t yet approved or cleared by the [place] FDA and  has been authorized for detection and/or diagnosis of SARS-CoV-2 by FDA under an Emergency Use Authorization (EUA). This EUA will remain  in effect (meaning this test can be used) for the duration of the COVID-19 declaration under Section  564(b)(1) of the Act, 21 U.S.C.section 360bbb-3(b)(1), unless the authorization is terminated  or revoked sooner.       Influenza A by PCR NEGATIVE NEGATIVE Final   Influenza B by PCR NEGATIVE NEGATIVE Final    Comment: (NOTE) The Xpert Xpress SARS-CoV-2/FLU/RSV plus assay is intended as an aid in the diagnosis of influenza from Nasopharyngeal swab specimens and should not be used as a sole basis for treatment. Nasal washings and aspirates are unacceptable for Xpert Xpress SARS-CoV-2/FLU/RSV testing.  Fact Sheet for Patients: EntrepreneurPulse.com.au  Fact Sheet for Healthcare Providers: IncredibleEmployment.be  This test is not yet approved or cleared by the [place] FDA and has been authorized for detection and/or diagnosis of SARS-CoV-2 by FDA under an Emergency Use Authorization (EUA). This EUA will remain in effect (meaning this test can be used) for the duration of the COVID-19 declaration under Section 564(b)(1) of the Act, 21 U.S.C. section 360bbb-3(b)(1), unless the authorization is terminated or revoked.  Performed at [facility] [address] [place], [place] [address]   MRSA PCR Screening     Status: None   Collection Time: 04/06/21  6:31 AM   Specimen: Nasal Mucosa; Nasopharyngeal  Result Value Ref Range Status   MRSA by PCR NEGATIVE NEGATIVE Final    Comment:        The GeneXpert MRSA Assay (FDA approved for NASAL specimens only), is one component of a comprehensive MRSA colonization surveillance program. It is not intended to  diagnose MRSA infection nor to guide or monitor treatment for MRSA infections. Performed at [facility], [place] [address] [place], [place] [address]          Radiology Studies: DG CHEST PORT 1 VIEW  Result Date: 04/10/2021 CLINICAL DATA:  Pneumothorax. EXAM: PORTABLE CHEST 1 VIEW COMPARISON:  April 09, 2021. FINDINGS: Slight patient rotation. Similar small right pneumothorax. Multifocal airspace opacities, slightly improved on the right and worsened on the left. Similar small right pleural effusion. Left subclavian approach Port-A-Cath with the tip projecting at the superior cavoatrial junction, similar. Similar cardiomediastinal silhouette. Aortic atherosclerosis. IMPRESSION: 1. Similar small right pneumothorax. 2. Multifocal airspace opacities, slightly improved on the right and worsened on the left. 3. Similar small right pleural effusion. Electronically Signed   By: Margaretha Sheffield MD   On: 04/10/2021 08:21   DG CHEST PORT 1 VIEW  Result Date: 04/09/2021 CLINICAL DATA:  Acute respiratory failure EXAM: PORTABLE CHEST 1 VIEW COMPARISON:  1 day prior FINDINGS: Left-sided Port-A-Cath tip at superior caval/atrial junction. Midline trachea. Mild cardiomegaly. Atherosclerosis in the transverse aorta. Tiny right pleural effusion. 5% right apical pneumothorax is minimally increased. Worsened right mid and upper lung airspace disease. Similar diffuse left-sided and right lower lobe pulmonary opacities. IMPRESSION: Minimal increase in 5% right sided pneumothorax. Worsened multifocal airspace disease, likely pneumonia. Aortic Atherosclerosis (ICD10-I70.0). Tiny right pleural effusion. Electronically Signed   By: Abigail Miyamoto M.D.   On: 04/09/2021 08:19   DG CHEST PORT 1 VIEW  Result Date: 04/08/2021 CLINICAL DATA:  Acute respiratory failure EXAM: PORTABLE CHEST 1 VIEW COMPARISON:  April 08, 2021 FINDINGS: The cardiomediastinal silhouette is unchanged in contour.LEFT chest port with tip  terminating over the superior cavoatrial junction. Removal of RIGHT-sided chest tube. There is a small RIGHT hydropneumothorax, similar in comparison to prior. Persistent heterogeneous opacification of the LEFT lung. Unchanged interstitial opacities throughout the RIGHT lung base. Visualized abdomen is unremarkable. Multilevel degenerative changes of the thoracic spine. IMPRESSION: Small RIGHT hydropneumothorax, similar in comparison  to prior status post RIGHT chest tube removal. Electronically Signed   By: Valentino Saxon MD   On: 04/08/2021 16:35        Scheduled Meds:  carBAMazepine  400 mg Per Tube BID   chlorhexidine  15 mL Mouth Rinse BID   Chlorhexidine Gluconate Cloth  6 each Topical Daily   doxycycline  100 mg Per Tube Q12H   feeding supplement (OSMOLITE 1.5 CAL)  237 mL Per Tube 5 X Daily   feeding supplement (PROSource TF)  45 mL Per Tube Daily   folic acid  1 mg Per Tube Daily   heparin injection (subcutaneous)  5,000 Units Subcutaneous Q8H   ipratropium-albuterol  3 mL Nebulization TID   levETIRAcetam  1,000 mg Per Tube BID   mouth rinse  15 mL Mouth Rinse BID   multivitamin with minerals  1 tablet Per Tube Daily   potassium chloride  40 mEq Per Tube Once   sodium chloride flush  10-40 mL Intracatheter Q12H   thiamine  100 mg Per Tube Daily   Continuous Infusions:  albumin human     piperacillin-tazobactam (ZOSYN)  IV 3.375 g (04/10/21 0637)   potassium chloride       LOS: 4 days   Time spent= 35 mins    Inaara Tye Arsenio Loader, MD [facility]  If 7PM-7AM, please contact night-coverage  04/10/2021, 8:31 AM

## 2021-04-10 NOTE — Progress Notes (Signed)
NAME:  [name], MRN:  [id number], DOB:  1976/07/11, LOS: 4 ADMISSION DATE:  04/06/2021, CONSULTATION DATE: 04/06/2021 REFERRING MD: [facility] ED, CHIEF COMPLAINT: Hypoxia, elevated lactic acid, elevated troponins.  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  He underwent radiation and chemotherapy pleated in January 2022.  PET scan revealed hypermetabolic state of the epiglottis.  On 02/20/2021 he had biopsy by Dr. Benjamine Mola did not reveal any cancerous cells.  Of note he has continued to smoke, drink alcohol, and now presents to [facility] 04/06/2021 with multilobar pneumococcus pneumonia and right pneumothorax s/p chest tube placement  Pertinent  Medical History    has a past medical history of Back pain, Bradycardia (08/17/2011), Cancer ([place]), COPD (chronic obstructive pulmonary disease) ([place]), ETOH abuse, GERD (gastroesophageal reflux disease), Hypertension, Macrocytosis (08/17/2011), Pancreatitis, Pneumonia, Port-A-Cath in place (09/04/2020), SAH (subarachnoid hemorrhage) ([place]), and Seizures ([place]).   Significant Hospital Events: Including procedures, antibiotic start and stop dates in addition to other pertinent events   04/06/2021 right chest tube [facility] at [facility] for tension pneumothorax right. Antibiotics for pneumococcal PNA 6/11 transfer to [facility]; chest tube removed with stable tiny pneumothorax. 6/12 stable vs slightly increased R pneumothorax after tube removed 6/13   Interim History / Subjective:  He denies worse   Objective   Blood pressure 126/61, pulse (!) 101, temperature 98.3 F (36.8 C), temperature source Oral, resp. rate 19, height 5\' 7"  (1.702 m), weight 43 kg, SpO2 98 %.        Intake/Output Summary (Last 24 hours) at 04/10/2021 1028 Last data filed at 04/10/2021 0437 Gross per 24 hour  Intake 2446.06 ml  Output 2700 ml  Net -253.94 ml    Filed Weights    04/06/21 0240 04/06/21 0630  Weight: 44.5 kg 43 kg    Examination: Gen:     Chronically ill appearing frail man lying in bed in NAD HEENT:  [place]/AT, eyes anicteric Lungs:    tachypnea, no accessory muscle use. Poor inspiratory function with ~250cc on IS. Rhonchi bilaterally, symmetric breath sounds. Weak voice. CV:         S1S2, RRR Abd:      Thin, soft, PEG tube Ext:    No cyanosis Skin:      no rashes, warm, dry Neuro:   awake, globally weak but no focal deficits  Labs/imaging that I havepersonally reviewed  (right click and "Reselect all SmartList Selections" daily)   CXR 6/13> stable apical and lateral small areas of pleural effusion.  Resolved Hospital Problem list     Assessment & Plan:  Acute on chronic hypoxic respiratory failure secondary to multilobar Streptococcus pneumonia, right pneumothorax History of recent head neck cancer, severe COPD with continued tobacco abuse, reported alcohol abuse despite having PEG in place since 08/22/2020.   -follow up CXR reviewed. Appears stable with tiny areas of residual pneumothorax that grossly look unchanged on review of 3 most recent CXRs. -con't antibiotics to complete 7 days -con't PRN  duonebs. Switching to yupelri and brovana. I do not think he has enough respiratory muscle strength at this point to generate enough flow for DPIs yet. -con't pulmonary hygiene; OOB mobility when able -Anticipate he will have recurrent issues with aspiration and with his cachexia and deconditioning, he is high risk for poor outcome related to poor pulmonary mechanics. Recommend [facility] consult for advanced care planning as I anticipate this is likely  to happen again.  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  Further biopsy 02/20/2021 by Dr. Benjamine Mola with no malignant cells identified. -per primary  Severe protein malnourishment secondary to head neck cancer -Cont tube feedings  PCCM will sign off.  Please call with questions.  Best practice (right click and "Reselect all SmartList Selections" daily)  Per primary team  Signature:   Julian Hy, DO 04/10/21 10:31 AM  Pulmonary & Critical Care

## 2021-04-11 DIAGNOSIS — Z7189 Other specified counseling: Secondary | ICD-10-CM

## 2021-04-11 DIAGNOSIS — Z515 Encounter for palliative care: Secondary | ICD-10-CM

## 2021-04-11 LAB — COMPREHENSIVE METABOLIC PANEL
ALT: 59 U/L — ABNORMAL HIGH (ref 0–44)
AST: 37 U/L (ref 15–41)
Albumin: 2.5 g/dL — ABNORMAL LOW (ref 3.5–5.0)
Alkaline Phosphatase: 62 U/L (ref 38–126)
Anion gap: 11 (ref 5–15)
BUN: 5 mg/dL — ABNORMAL LOW (ref 6–20)
CO2: 30 mmol/L (ref 22–32)
Calcium: 8.4 mg/dL — ABNORMAL LOW (ref 8.9–10.3)
Chloride: 79 mmol/L — ABNORMAL LOW (ref 98–111)
Creatinine, Ser: 0.32 mg/dL — ABNORMAL LOW (ref 0.61–1.24)
GFR, Estimated: >60 mL/min (ref >60)
Glucose, Bld: 188 mg/dL — ABNORMAL HIGH (ref 70–99)
Potassium: 2.9 mmol/L — ABNORMAL LOW (ref 3.5–5.1)
Sodium: 120 mmol/L — ABNORMAL LOW (ref 135–145)
Total Bilirubin: 0.3 mg/dL (ref 0.3–1.2)
Total Protein: 5.7 g/dL — ABNORMAL LOW (ref 6.5–8.1)

## 2021-04-11 LAB — CBC
HCT: 27.6 % — ABNORMAL LOW (ref 39.0–52.0)
Hemoglobin: 9.6 g/dL — ABNORMAL LOW (ref 13.0–17.0)
MCH: 33.2 pg (ref 26.0–34.0)
MCHC: 34.8 g/dL (ref 30.0–36.0)
MCV: 95.5 fL (ref 80.0–100.0)
Platelets: 107 10*3/uL — ABNORMAL LOW (ref 150–400)
RBC: 2.89 MIL/uL — ABNORMAL LOW (ref 4.22–5.81)
RDW: 12 % (ref 11.5–15.5)
WBC: 5.8 10*3/uL (ref 4.0–10.5)
nRBC: 0 % (ref 0.0–0.2)

## 2021-04-11 LAB — MAGNESIUM: Magnesium: 1.7 mg/dL (ref 1.7–2.4)

## 2021-04-11 LAB — CULTURE, BLOOD (SINGLE)
Culture: NO GROWTH
Special Requests: ADEQUATE

## 2021-04-11 MED ORDER — POTASSIUM CHLORIDE 20 MEQ PO PACK
40.0000 meq | PACK | ORAL | Status: AC
  Administered 2021-04-11 (×3): 40 meq via ORAL
  Filled 2021-04-11 (×3): qty 2

## 2021-04-11 MED ORDER — MAGNESIUM SULFATE 2 GM/50ML IV SOLN
2.0000 g | Freq: Once | INTRAVENOUS | Status: AC
  Administered 2021-04-11: 2 g via INTRAVENOUS
  Filled 2021-04-11: qty 50

## 2021-04-11 NOTE — Consult Note (Signed)
Consultation Note Date: 04/11/2021   Patient Name: [name]  DOB: [birth year]  MRN: [id number]  Age / Sex: 45 y.o., male  PCP: Jani Gravel, MD Referring Physician: Damita Lack, MD  Reason for Consultation:  Code status discussion  HPI/Patient Profile: 45 y.o. 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  He is s/p chest tube placement with resolution of pneumo. He has a PEG tube in place. Palliative medicine consulted for [place] and code status discussion.   Clinical Assessment and Goals of Care: Chart reviewed and patient discussed with Dr. Reesa Chew.  Silvia is awake, but lethargic on my visit. His niece- [name] is at bedside.  Discussed surrogate decision maker with [name]- his first preference is for his significant other- [name] to be his Media planner, with Museum/gallery conservator as an alternate if [name] is unreachable.  I reviewed HCPOA paperwork with him.  After completing HCPOA paperwork he was very tired and requested to rest.  Spoke with Amber outside his room. She is aware of recommendation for DNR status. We discussed what DNR means, and how it differs from "comfort measures only".  Amber asked if visitation changes for patients who are DNR- it doesn't- only for patient's who are dying and who we expect death is imminent or patients who are comfort measures only.   Primary Decision Maker PATIENT    SUMMARY OF RECOMMENDATIONS -HCPOA completed and left at bedside -Request chaplain to assist in facilitating notarizing -Plan made to meet tomorrow at 1030 with patient, [name], and Museum/gallery conservator for further discussion of [place]     Code Status/Advance Care  Planning: Full code   Primary Diagnoses: Present on Admission:  Respiratory failure ([place])  COPD (chronic obstructive pulmonary disease) ([place])  HTN (hypertension)   I have reviewed the medical record, interviewed the patient and family, and examined the patient. The following aspects are pertinent.  Past Medical History:  Diagnosis Date   Back pain    Bradycardia 08/17/2011   Cancer ([place])    COPD (chronic obstructive pulmonary disease) ([facility])    ETOH abuse    GERD (gastroesophageal reflux disease)    Hypertension    Macrocytosis 08/17/2011   Pancreatitis    Pneumonia    Port-A-Cath in place 09/04/2020   SAH (subarachnoid hemorrhage) ([place])    Seizures ([place])    last one 6 mos ago   Social History   Socioeconomic History   Marital status: Single    Spouse name: Not on file   Number of children: Not on file   Years of education: Not on file   Highest education level: Not on file  Occupational History   Not on file  Tobacco Use   Smoking status: Every Day    Packs/day: 1.00    Years: 20.00    Pack years: 20.00    Types: Cigarettes  Smokeless tobacco: Never  Vaping Use   Vaping Use: Never used  Substance and Sexual Activity   Alcohol use: Yes    Comment: couple of beers daily   Drug use: Not Currently    Types: Marijuana   Sexual activity: Not Currently  Other Topics Concern   Not on file  Social History Narrative   Not on file   Social Determinants of Health   Financial Resource Strain: Low Risk    Difficulty of Paying Living Expenses: Not very hard  Food Insecurity: No Food Insecurity   Worried About Running Out of Food in the Last Year: Never true   Ran Out of Food in the Last Year: Never true  Transportation Needs: No Transportation Needs   Lack of Transportation (Medical): No   Lack of Transportation (Non-Medical): No  Physical Activity: Sufficiently Active   Days of Exercise per Week: 7 days   Minutes of Exercise per Session: 60 min  Stress:  Stress Concern Present   Feeling of Stress : To some extent  Social Connections: Moderately Isolated   Frequency of Communication with Friends and Family: More than three times a week   Frequency of Social Gatherings with Friends and Family: More than three times a week   Attends Religious Services: Never   Marine scientist or Organizations: No   Attends Music therapist: Never   Marital Status: Living with partner   Scheduled Meds:  arformoterol  15 mcg Nebulization BID   carBAMazepine  400 mg Per Tube BID   chlorhexidine  15 mL Mouth Rinse BID   Chlorhexidine Gluconate Cloth  6 each Topical Daily   doxycycline  100 mg Per Tube Q12H   feeding supplement (OSMOLITE 1.5 CAL)  237 mL Per Tube 5 X Daily   feeding supplement (PROSource TF)  45 mL Per Tube Daily   folic acid  1 mg Per Tube Daily   heparin injection (subcutaneous)  5,000 Units Subcutaneous Q8H   levETIRAcetam  1,000 mg Per Tube BID   mouth rinse  15 mL Mouth Rinse BID   multivitamin with minerals  1 tablet Per Tube Daily   potassium chloride  40 mEq Oral Q4H   revefenacin  175 mcg Nebulization Daily   sodium chloride flush  10-40 mL Intracatheter Q12H   thiamine  100 mg Per Tube Daily   Continuous Infusions:  piperacillin-tazobactam (ZOSYN)  IV 3.375 g (04/11/21 1319)   PRN Meds:.hydrALAZINE, ipratropium-albuterol, LORazepam, oxyCODONE-acetaminophen, sodium chloride flush Medications Prior to Admission:  Prior to Admission medications   Medication Sig Start Date End Date Taking? Authorizing Provider  acetaminophen (TYLENOL) 325 MG tablet Take 2 tablets (650 mg total) by mouth every 6 (six) hours as needed for mild pain (or Fever >/= 101). 10/19/20  Yes Emokpae, Courage, MD  albuterol (PROVENTIL) (2.5 MG/3ML) 0.083% nebulizer solution Take 2.5 mg by nebulization every 6 (six) hours as needed for wheezing or shortness of breath.   Yes [provider]  albuterol (VENTOLIN HFA) 108 (90 Base)  MCG/ACT inhaler Inhale 2 puffs into the lungs every 6 (six) hours as needed for wheezing or shortness of breath. 10/19/20  Yes Emokpae, Courage, MD  amLODipine (NORVASC) 10 MG tablet Take 10 mg by mouth daily.  06/22/20  Yes [provider]  carbamazepine (TEGRETOL) 200 MG tablet Take 400 mg by mouth 2 (two) times daily. 11/16/20  Yes [provider]  diclofenac Sodium (VOLTAREN) 1 % GEL Apply 2 g topically daily as  needed (pain).  07/28/20  Yes [provider]  folic acid (FOLVITE) 1 MG tablet Take 1 tablet (1 mg total) by mouth daily. 10/20/20  Yes Emokpae, Courage, MD  gabapentin (NEURONTIN) 300 MG capsule Take 300 mg by mouth 3 (three) times daily. 07/21/20  Yes [provider]  levETIRAcetam (KEPPRA) 500 MG tablet Take 2 tablets (1,000 mg total) by mouth 2 (two) times daily. 10/19/20  Yes Emokpae, Courage, MD  nicotine (NICODERM CQ - DOSED IN MG/24 HOURS) 21 mg/24hr patch Place 21 mg onto the skin daily. 01/27/21  Yes [provider]  Nutritional Supplements (FEEDING SUPPLEMENT, OSMOLITE 1.5 CAL,) LIQD Bolus 237 ml Osmolite 1.5 daily with 60 ml H20 before and after feeding via PEG Continuous Osmolite 1.5 -4 cartons (948 ml) from 8 pm to 8 am (12 hours/day) Start continuous feedings @ 30 ml/hr for 12 hours, increase by 10 ml every day to goal rate 80 ml/12 hours 120 ml free water flush before and after continuous feedings Meets 100% calorie needs 01/13/21  Yes Derek Jack, MD  Oxycodone HCl 10 MG TABS Take 1 tablet (10 mg total) by mouth every 12 (twelve) hours as needed. Patient taking differently: Take 10 mg by mouth 3 (three) times daily. 12/15/20  Yes Derek Jack, MD  pantoprazole (PROTONIX) 40 MG tablet Take 1 tablet (40 mg total) by mouth daily. 10/19/20  Yes Emokpae, Courage, MD  SSD 1 % cream Apply topically in the morning and at bedtime. 10/04/20  Yes [provider]  chlordiazePOXIDE (LIBRIUM) 25 MG capsule Take 1 capsule (25  mg total) by mouth every 6 (six) hours. Patient not taking: Reported on 04/06/2021 09/05/20   Derek Jack, MD  lidocaine (XYLOCAINE) 2 % solution Use as directed 5 mLs in the mouth or throat 4 (four) times daily as needed for mouth pain. Swish and swallow Patient not taking: Reported on 04/06/2021 11/20/20   Margarita Mail, PA-C  lidocaine-prilocaine (EMLA) cream Apply small amount to port a cath site and cover with plastic wrap 1 hour prior to chemotherapy appointments Patient not taking: Reported on 04/06/2021 09/04/20   Derek Jack, MD  LORazepam (ATIVAN) 1 MG tablet Take 1 tablet (1 mg total) by mouth daily as needed for up to 10 doses for anxiety (For air hunger). Patient not taking: Reported on 04/06/2021 11/20/20   Wyvonnia Dusky, MD  Multiple Vitamin (MULTIVITAMIN WITH MINERALS) TABS tablet Take 1 tablet by mouth daily. Patient not taking: Reported on 04/06/2021 10/20/20   Roxan Hockey, MD  ondansetron (ZOFRAN) 4 MG tablet Take 1 tablet (4 mg total) by mouth every 6 (six) hours as needed for nausea. Patient not taking: Reported on 04/06/2021 10/19/20   Roxan Hockey, MD  phenol (CHLORASEPTIC) 1.4 % LIQD Use as directed 1 spray in the mouth or throat as needed for throat irritation / pain. Patient not taking: Reported on 04/06/2021 10/19/20   Roxan Hockey, MD  prochlorperazine (COMPAZINE) 10 MG tablet Take 1 tablet (10 mg total) by mouth every 6 (six) hours as needed for nausea or vomiting. Patient not taking: No sig reported 08/25/20   Derek Jack, MD  thiamine 100 MG tablet Take 1 tablet (100 mg total) by mouth daily. Patient not taking: No sig reported 10/20/20   Roxan Hockey, MD  topiramate (TOPAMAX) 25 MG tablet Take 1 tablet (25 mg total) by mouth See admin instructions. Take 1 tablet (25 mg) twice daily for 1 week, then increase to 2 tablets (50 mg) twice daily Patient  not taking: No sig reported 10/19/20 10/19/21  Roxan Hockey, MD   No Known  Allergies Review of Systems  Constitutional:  Positive for activity change, appetite change and fatigue.   Physical Exam Vitals and nursing note reviewed.  Constitutional:      Appearance: He is ill-appearing and toxic-appearing.     Comments: cachectic  Neurological:     Mental Status: He is oriented to person, place, and time.     Comments: sleepy    Vital Signs: BP 132/73 (BP Location: Right Arm)   Pulse 95   Temp 97.7 F (36.5 C) (Oral)   Resp 20   Ht 5\' 7"  (1.702 m)   Wt 43 kg   SpO2 100%   BMI 14.85 kg/m  Pain Scale: 0-10   Pain Score: 0-No pain   SpO2: SpO2: 100 % O2 Device:SpO2: 100 % O2 Flow Rate: .O2 Flow Rate (L/min): 3 L/min  IO: Intake/output summary:  Intake/Output Summary (Last 24 hours) at 04/11/2021 1650 Last data filed at 04/11/2021 0758 Gross per 24 hour  Intake 460.83 ml  Output 2100 ml  Net -1639.17 ml    LBM: Last BM Date: 04/09/21 Baseline Weight: Weight: 44.5 kg Most recent weight: Weight: 43 kg     Palliative Assessment/Data: PPS: 20%     Thank you for this consult. Palliative medicine will continue to follow and assist as needed.   Time In: 1549 Time Out: 1701 Time Total: 72 minutes Greater than 50%  of this time was spent counseling and coordinating care related to the above assessment and plan.  Signed by: Mariana Kaufman, AGNP-C Palliative Medicine    Please contact Palliative Medicine Team phone at [phone] for questions and concerns.  For individual provider: See Shea Evans

## 2021-04-11 NOTE — Progress Notes (Signed)
  Speech Language Pathology Treatment: Dysphagia  Patient Details Name: [name] MRN: [id number] DOB: [birth year] Today's Date: 04/11/2021 Time: 6767-2094 SLP Time Calculation (min) (ACUTE ONLY): 21 min  Assessment / Plan / Recommendation Clinical Impression  Pt seen for dysphagia intervention with focus on pharyngeal strength exercises and respiratory muscle strength training. Performed diligent oral care, oral hygiene appearing xerostomic (as expected with NPO status and hx of XRT). Pt utilized incentive spirometer x10 with up to 500 mL. He did require cueing for proper sequencing and pacing. Requested flutter valve by RT per MD request. Pt continues to have very congested breath sounds and weak inefficient cough. Pt positioned fully upright at bedside. Instructed for completion of effortful swallow with single ice chips. Pt able to complete 2-3 effortful swallows with 3-4 single ice chips. Congested cough persists consistent with recent instrumental exam and compromised airway protection. Pt with palliative consult ordered. Will continue to follow for goals of care.     HPI HPI: 45 year-old male who was 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  He underwent radiation and chemotherapy completed 10/2020. PEG placed 07/2020.  Per chart PET scan revealed hypermetabolic state of the epiglottis. hypermetabolic state of the epiglottis. Direct laryngoscopy 02/20/21 with ENT revealed partial erosion of the epiglottis, with necrotic debris covering the rest of the epiglottis, biopsy taken did not reveal any cancerous cells. Pt presented to [facility] increasing shortness of breath. Chest x-ray with possible multifocal pneumonia. CXR revealed Large right tension pneumothorax. Multifocal pulmonary infiltrates, possibly with foci of cavitation  most in keeping with multifocal possibly necrotizing pneumonia. MBS 08/23/20 (prior to treatment) revealed penetration and aspiration after swallow  without awareness with wet vocal quality and throat clearing, residue greater with nectar. Rec'd Dys 3, thin, cough post swallow, dysphagia treatment with swallow exercises. MBSS this admission revealed gross aspiration with POs and NPO with therapeutic exercise and ice chips.      SLP Plan  Continue with current plan of care       Recommendations  Diet recommendations: NPO Medication Administration: Via alternative means                Plan: Continue with current plan of care       GO                Hayden Rasmussen MA, CCC-SLP Acute Rehabilitation Services   04/11/2021, 3:04 PM

## 2021-04-11 NOTE — Progress Notes (Signed)
Palliative-   Consult received. Called patient's spouse to arrange a time for [place] discussion.   Left message requesting return call.   Mariana Kaufman, AGNP-C Palliative Medicine  No charge

## 2021-04-11 NOTE — Progress Notes (Signed)
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  Chest tube catheter was placed and transferred to [facility].  Initially also hyponatremic thought to be due to alcohol use but improved spontaneously.  He has poor respiratory reserve and some signs of recurrent aspiration with cachexia along with deconditioning.  High risk patient therefore palliative care team consulted.   Assessment & Plan:   Principal Problem:   Tension pneumothorax Active Problems:   COPD (chronic obstructive pulmonary disease) (HCC)   HTN (hypertension)   Respiratory failure (HCC)   Pressure injury of skin   Protein-calorie malnutrition, severe   Acute on chronic hypoxic respiratory failure secondary to right-sided tension pneumothorax, 3-4 L nasal cannula Bilateral diffuse rhonchi. - Status post chest tube placement 6/9.  Chest tube removed 6/11.  Chest x-ray shows slight worsening infiltrate. - Wean oxygen as appropriate - Out of bed to chair.  I-S/flutter - Continue bronchodilators.  Aspiration pneumonia, worsening.  Multifocal - Initially suspicion of chronic aspiration. - Antibiotics-continue Zosyn/Doxy.  Complete 7-day course. - I-S/flutter - Bronchodilators scheduled and as needed  Hypokalemia - Aggressive repletion  Hypoalbuminemia - Likely third spacing.  Received IV albumin  Hyponatremia, 121.  Around his baseline - Suspect due to alcohol use.    Elevated troponin - Suspect secondary to demand ischemia.  No evidence of ACS.  Continue to monitor. - Echocardiogram EF 50-55%  Alcohol use - Alcohol withdrawal protocol - Multivitamin, thiamine,  folate  Transaminitis; improved - Suspect alcohol hepatitis.  Supportive care.  Monitor LFTs.  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 Benjamine Mola.  Supportive care  History of subarachnoid hemorrhage and seizure disorder - On Tegretol and Keppra  Severe protein calorie malnutrition - Dietitian following.  On tube feeds. -Seen by speech.  Continues to be n.p.o.   After long goals of care discussion-family and patient has decided they would want full scope of medical treatment and wants to remain full code.  Given his advanced medical condition and poor nutritional status, overall he has poor prognosis.  This could be a recurring issue in the future which have discussed with the family.  Palliative care consulted.  Nutritional status  Nutrition Problem: Severe Malnutrition Etiology: chronic illness (epiglottis cancer s/p chemotherapy and radiation, EtOH abus)  Signs/Symptoms: severe muscle depletion, severe fat depletion, percent weight loss (14.5% weight loss in less than 7 months) Percent weight loss: 14.5 %  Interventions: MVI, Prostat, Tube feeding   DVT prophylaxis: Subcu heparin Code Status: Full code Family Communication: [name] at bedside  Status is: Inpatient    Dispo: The patient is from: Home              Anticipated d/c is to: Home              Patient currently is not medically stable to d/c.  Chest tube out, awaiting PT/OT. Hopefully home in next 24-48 hrs.    Difficult to place patient No  Body mass index is 14.85 kg/m.  Pressure Injury 04/06/21 Coccyx Mid Stage 1 -  Intact skin with non-blanchable redness of a localized area usually over a  bony prominence. stage 1 sacrum (Active)  04/06/21 0630  Location: Coccyx  Location Orientation: Mid  Staging: Stage 1 -  Intact skin with non-blanchable redness of a localized area usually over a bony prominence.  Wound  Description (Comments): stage 1 sacrum  Present on Admission: Yes    Subjective: No acute events overnight.  Doing slightly better with incentive spirometer this morning-bringing it up to 500 cc.  Asking me to increase his pain medication but have advised him that current dose is probably appropriate for him to avoid any further respiratory depression.    Examination: Constitutional: Not in acute distress but appears chronically ill, cachectic frail.  Appears older than stated age.  Bilateral temporal wasting.  Extremely weak. Respiratory: Clear to auscultation bilaterally Cardiovascular: Normal sinus rhythm, no rubs Abdomen: Nontender nondistended good bowel sounds Musculoskeletal: No edema noted Skin: No rashes seen Neurologic: CN 2-12 grossly intact.  And nonfocal Psychiatric: Alert to name and place.  PEG tube in place Chest tube in place  Objective: Vitals:   04/10/21 2315 04/10/21 2318 04/11/21 0300 04/11/21 0721  BP: 120/68  138/78 137/81  Pulse: (!) 110 (!) 109 (!) 103 (!) 110  Resp: (!) 28 (!) 23 18 20   Temp: 99.4 F (37.4 C)  98 F (36.7 C) 98.2 F (36.8 C)  TempSrc: Axillary  Oral Oral  SpO2: 93% 93% 91% 90%  Weight:      Height:        Intake/Output Summary (Last 24 hours) at 04/11/2021 0728 Last data filed at 04/11/2021 0500 Gross per 24 hour  Intake 299.15 ml  Output 2100 ml  Net -1800.85 ml   Filed Weights   04/06/21 0240 04/06/21 0630  Weight: 44.5 kg 43 kg     Data Reviewed:   CBC: Recent Labs  Lab 04/06/21 0233 04/07/21 0243 04/09/21 0233 04/10/21 0718 04/11/21 0046  WBC 23.2* 17.0* 12.2* 10.0 5.8  NEUTROABS  --  15.2*  --   --   --   HGB 13.1 11.2* 10.6* 11.7* 9.6*  HCT 38.0* 31.0* 30.5* 33.5* 27.6*  MCV 99.5 95.4 98.4 95.2 95.5  PLT 220 148* 127* 115* 536*   Basic Metabolic Panel: Recent Labs  Lab 04/06/21 1107 04/07/21 0243 04/07/21 2028 04/08/21 0343 04/08/21 2117 04/09/21 0233 04/10/21 0718 04/10/21 1621 04/11/21 0046   NA  --  121*  --  121* 121* 121* 121* 121* 120*  K  --  3.2*  --  3.6 3.1* 4.3 2.4* 3.6 2.9*  CL  --  79*  --  79* 82* 84* 78* 79* 79*  CO2  --  32  --  29 32 31 35* 33* 30  GLUCOSE  --  115*  --  110* 128* 105* 102* 103* 188*  BUN  --  <5*  --  10 8 5* 6 6 5*  CREATININE  --  <0.30*  --  0.32* <0.30* <0.30* <0.30* <0.30* 0.32*  CALCIUM  --  8.0*  --  8.3* 7.6* 8.0* 8.0* 8.5* 8.4*  MG 1.5* 1.9  --  1.9  --  1.4* 1.7  --  1.7  PHOS 2.4* 1.6* 2.2* 1.9*  --   --   --   --   --    GFR: Estimated Creatinine Clearance: 70.2 mL/min (A) (by C-G formula based on SCr of 0.32 mg/dL (L)). Liver Function Tests: Recent Labs  Lab 04/06/21 0849 04/07/21 0243 04/09/21 0233 04/10/21 0718 04/11/21 0046  AST 1,484* 666* 73* 43* 37  ALT 472* 345* 139* 85* 59*  ALKPHOS 87 66 79 79 62  BILITOT 0.5 0.5 0.4 0.2* 0.3  PROT 5.4* 5.5* 5.4* 5.6* 5.7*  ALBUMIN 2.4* 2.3* 2.1* 2.0* 2.5*   No results for input(s): LIPASE, AMYLASE in the last 168 hours. No results for input(s): AMMONIA in the last 168 hours. Coagulation Profile: Recent Labs  Lab 04/06/21 0233  INR 1.2   Cardiac Enzymes: No results for input(s): CKTOTAL, CKMB, CKMBINDEX, TROPONINI in the last 168 hours. BNP (last 3 results) No results for input(s): PROBNP in the last 8760 hours. HbA1C: No results for input(s): HGBA1C in the last 72 hours. CBG: Recent Labs  Lab 04/06/21 2319 04/07/21 0359 04/07/21 0739 04/07/21 1145 04/07/21 1613  GLUCAP 259* 84 135* 248* 194*   Lipid Profile: No results for input(s): CHOL, HDL, LDLCALC, TRIG, CHOLHDL, LDLDIRECT in the last 72 hours. Thyroid Function Tests: Recent Labs    04/08/21 0803  TSH 1.867   Anemia Panel: No results for input(s): VITAMINB12, FOLATE, FERRITIN, TIBC, IRON, RETICCTPCT in the last 72 hours. Sepsis Labs: Recent Labs  Lab 04/06/21 0233 04/06/21 0403 04/06/21 0849 04/07/21 0243 04/08/21 0343  PROCALCITON  --   --  5.38 3.37 1.49  LATICACIDVEN 7.0* 4.5* 3.2*  --    --     Recent Results (from the past 240 hour(s))  Culture, blood (single) w Reflex to ID Panel     Status: None   Collection Time: 04/06/21  2:33 AM   Specimen: BLOOD  Result Value Ref Range Status   Specimen Description BLOOD PORTA CATH  Final   Special Requests   Final    BOTTLES DRAWN AEROBIC AND ANAEROBIC Blood Culture adequate volume   Culture   Final    NO GROWTH 5 DAYS Performed at [facility], [address], [place], [place] [address]    Report Status 04/11/2021 FINAL  Final  Resp Panel by RT-PCR (Flu A&B, Covid) Nasopharyngeal Swab     Status: None   Collection Time: 04/06/21  4:04 AM   Specimen: Nasopharyngeal Swab; Nasopharyngeal(NP) swabs in vial transport medium  Result Value Ref Range Status   SARS Coronavirus 2 by RT PCR NEGATIVE NEGATIVE Final    Comment: (NOTE) SARS-CoV-2 target nucleic acids are NOT DETECTED.  The SARS-CoV-2 RNA is generally detectable in upper respiratory specimens during the acute phase of infection. The lowest concentration of SARS-CoV-2 viral copies this assay can detect is 138 copies/mL. A negative result does not preclude SARS-Cov-2 infection and should not be used as the sole basis for treatment or other patient management decisions. A negative result may occur with  improper specimen collection/handling, submission of specimen other than nasopharyngeal swab, presence of viral mutation(s) within the areas targeted by this assay, and inadequate number of viral copies(<138 copies/mL). A negative result must be combined with clinical observations, patient history, and epidemiological information. The expected result is Negative.  Fact Sheet for Patients:  EntrepreneurPulse.com.au  Fact Sheet for Healthcare Providers:  IncredibleEmployment.be  This test is no t yet approved or cleared by the [place] FDA and  has been authorized for detection and/or diagnosis of SARS-CoV-2 by FDA under an  Emergency Use Authorization (EUA). This EUA will remain  in effect (meaning this test can be used) for the duration of the COVID-19 declaration under Section 564(b)(1) of the Act, 21 U.S.C.section 360bbb-3(b)(1), unless the authorization is terminated  or revoked sooner.       Influenza A by PCR NEGATIVE NEGATIVE Final  Influenza B by PCR NEGATIVE NEGATIVE Final    Comment: (NOTE) The Xpert Xpress SARS-CoV-2/FLU/RSV plus assay is intended as an aid in the diagnosis of influenza from Nasopharyngeal swab specimens and should not be used as a sole basis for treatment. Nasal washings and aspirates are unacceptable for Xpert Xpress SARS-CoV-2/FLU/RSV testing.  Fact Sheet for Patients: EntrepreneurPulse.com.au  Fact Sheet for Healthcare Providers: IncredibleEmployment.be  This test is not yet approved or cleared by the [place] FDA and has been authorized for detection and/or diagnosis of SARS-CoV-2 by FDA under an Emergency Use Authorization (EUA). This EUA will remain in effect (meaning this test can be used) for the duration of the COVID-19 declaration under Section 564(b)(1) of the Act, 21 U.S.C. section 360bbb-3(b)(1), unless the authorization is terminated or revoked.  Performed at [facility], [address], [place], [place] [address]   MRSA PCR Screening     Status: None   Collection Time: 04/06/21  6:31 AM   Specimen: Nasal Mucosa; Nasopharyngeal  Result Value Ref Range Status   MRSA by PCR NEGATIVE NEGATIVE Final    Comment:        The GeneXpert MRSA Assay (FDA approved for NASAL specimens only), is one component of a comprehensive MRSA colonization surveillance program. It is not intended to diagnose MRSA infection nor to guide or monitor treatment for MRSA infections. Performed at [facility], [place] [address] [place], [place] [address]          Radiology Studies: DG CHEST PORT 1 VIEW  Result Date:  04/10/2021 CLINICAL DATA:  Pneumothorax. EXAM: PORTABLE CHEST 1 VIEW COMPARISON:  April 09, 2021. FINDINGS: Slight patient rotation. Similar small right pneumothorax. Multifocal airspace opacities, slightly improved on the right and worsened on the left. Similar small right pleural effusion. Left subclavian approach Port-A-Cath with the tip projecting at the superior cavoatrial junction, similar. Similar cardiomediastinal silhouette. Aortic atherosclerosis. IMPRESSION: 1. Similar small right pneumothorax. 2. Multifocal airspace opacities, slightly improved on the right and worsened on the left. 3. Similar small right pleural effusion. Electronically Signed   By: Margaretha Sheffield MD   On: 04/10/2021 08:21        Scheduled Meds:  arformoterol  15 mcg Nebulization BID   carBAMazepine  400 mg Per Tube BID   chlorhexidine  15 mL Mouth Rinse BID   Chlorhexidine Gluconate Cloth  6 each Topical Daily   doxycycline  100 mg Per Tube Q12H   feeding supplement (OSMOLITE 1.5 CAL)  237 mL Per Tube 5 X Daily   feeding supplement (PROSource TF)  45 mL Per Tube Daily   folic acid  1 mg Per Tube Daily   heparin injection (subcutaneous)  5,000 Units Subcutaneous Q8H   levETIRAcetam  1,000 mg Per Tube BID   mouth rinse  15 mL Mouth Rinse BID   multivitamin with minerals  1 tablet Per Tube Daily   potassium chloride  40 mEq Oral Q4H   revefenacin  175 mcg Nebulization Daily   sodium chloride flush  10-40 mL Intracatheter Q12H   thiamine  100 mg Per Tube Daily   Continuous Infusions:  albumin human 25 g (04/11/21 0359)   magnesium sulfate bolus IVPB     piperacillin-tazobactam (ZOSYN)  IV 3.375 g (04/11/21 0519)     LOS: 5 days   Time spent= 35 mins    Brandy Zuba Arsenio Loader, MD [facility]  If 7PM-7AM, please contact night-coverage  04/11/2021, 7:28 AM

## 2021-04-12 ENCOUNTER — Other Ambulatory Visit (HOSPITAL_COMMUNITY): Payer: Self-pay

## 2021-04-12 ENCOUNTER — Encounter (HOSPITAL_COMMUNITY): Payer: Self-pay | Admitting: Hematology

## 2021-04-12 DIAGNOSIS — Z859 Personal history of malignant neoplasm, unspecified: Secondary | ICD-10-CM

## 2021-04-12 DIAGNOSIS — R54 Age-related physical debility: Secondary | ICD-10-CM

## 2021-04-12 LAB — BASIC METABOLIC PANEL
Anion gap: 12 (ref 5–15)
BUN: 6 mg/dL (ref 6–20)
CO2: 27 mmol/L (ref 22–32)
Calcium: 8.3 mg/dL — ABNORMAL LOW (ref 8.9–10.3)
Chloride: 83 mmol/L — ABNORMAL LOW (ref 98–111)
Creatinine, Ser: <0.3 mg/dL — ABNORMAL LOW (ref 0.61–1.24)
Glucose, Bld: 109 mg/dL — ABNORMAL HIGH (ref 70–99)
Potassium: 3.3 mmol/L — ABNORMAL LOW (ref 3.5–5.1)
Sodium: 122 mmol/L — ABNORMAL LOW (ref 135–145)

## 2021-04-12 LAB — CBC
HCT: 27.6 % — ABNORMAL LOW (ref 39.0–52.0)
Hemoglobin: 9.8 g/dL — ABNORMAL LOW (ref 13.0–17.0)
MCH: 33.7 pg (ref 26.0–34.0)
MCHC: 35.5 g/dL (ref 30.0–36.0)
MCV: 94.8 fL (ref 80.0–100.0)
Platelets: 97 10*3/uL — ABNORMAL LOW (ref 150–400)
RBC: 2.91 MIL/uL — ABNORMAL LOW (ref 4.22–5.81)
RDW: 12.1 % (ref 11.5–15.5)
WBC: 6.8 10*3/uL (ref 4.0–10.5)
nRBC: 0 % (ref 0.0–0.2)

## 2021-04-12 LAB — MAGNESIUM: Magnesium: 1.6 mg/dL — ABNORMAL LOW (ref 1.7–2.4)

## 2021-04-12 MED ORDER — OXYCODONE HCL 5 MG/5ML PO SOLN: 10.0000 mg | ORAL | Status: DC | PRN

## 2021-04-12 MED ORDER — LORAZEPAM 2 MG/ML PO CONC: 2.0000 mg | ORAL | Status: DC | PRN

## 2021-04-12 MED ORDER — MAGNESIUM SULFATE 4 GM/100ML IV SOLN
4.0000 g | Freq: Once | INTRAVENOUS | Status: DC
  Filled 2021-04-12 (×2): qty 100

## 2021-04-12 MED ORDER — HEPARIN SOD (PORK) LOCK FLUSH 100 UNIT/ML IV SOLN
500.0000 [IU] | INTRAVENOUS | Status: AC | PRN
  Administered 2021-04-12: 500 [IU]
  Filled 2021-04-12: qty 5

## 2021-04-12 MED ORDER — LORAZEPAM 1 MG PO TABS: ORAL_TABLET | ORAL | 0 refills | Status: AC

## 2021-04-12 MED ORDER — LORAZEPAM 1 MG PO TABS
ORAL_TABLET | ORAL | 0 refills | Status: DC
  Filled 2021-04-12: qty 25, 4d supply, fill #0

## 2021-04-12 MED ORDER — LORAZEPAM 2 MG/ML PO CONC: 0.5000 mg | Freq: Two times a day (BID) | ORAL | Status: DC

## 2021-04-12 MED ORDER — OXYCODONE HCL 5 MG/5ML PO SOLN
10.0000 mg | Freq: Two times a day (BID) | ORAL | Status: DC
  Administered 2021-04-12: 10 mg
  Filled 2021-04-12: qty 10

## 2021-04-12 MED ORDER — SPIRITUS FRUMENTI
1.0000 | Freq: Every evening | ORAL | Status: DC
  Filled 2021-04-12: qty 1

## 2021-04-12 MED ORDER — LORAZEPAM 2 MG/ML PO CONC: 2.0000 mg | Freq: Two times a day (BID) | ORAL | Status: DC

## 2021-04-12 MED ORDER — LORAZEPAM 2 MG/ML PO CONC: 0.5000 mg | ORAL | Status: DC | PRN

## 2021-04-12 MED ORDER — POTASSIUM CHLORIDE 20 MEQ PO PACK
60.0000 meq | PACK | Freq: Once | ORAL | Status: DC
  Filled 2021-04-12: qty 3

## 2021-04-12 MED ORDER — SUCRALFATE 1 GM/10ML PO SUSP
1.0000 g | Freq: Four times a day (QID) | ORAL | Status: DC
  Administered 2021-04-12: 1 g via ORAL
  Filled 2021-04-12 (×2): qty 10

## 2021-04-12 NOTE — Progress Notes (Signed)
PT Cancellation Note  Patient Details Name: [name] MRN: [id number] DOB: Jul 08, 1976   Cancelled Treatment:    Reason Eval/Treat Not Completed: Other (comment) (Acknowledge transfer to comfort care and discontinue of therapy services.  Will sign off.) 04/12/2021  Ginger Carne., PT Acute Rehabilitation Services [phone]  (pager) [phone]  (office)  Tessie Fass Meagan Spease 04/12/2021, 2:06 PM

## 2021-04-12 NOTE — Progress Notes (Signed)
O2 tank delivered, discharged home accompanied by cousin JED, discharge instruction given  to pt/cousin. Belongings taken home.

## 2021-04-12 NOTE — TOC Initial Note (Addendum)
Transition of Care [facility]) - Initial/Assessment Note    Patient Details  Name: [name] MRN: [id number] Date of Birth: [birth year]  Transition of Care [facility]) CM/SW Contact:    Joanne Chars, LCSW Phone Number: 04/12/2021, 12:54 PM  Clinical Narrative:    CSW met in room with pt, [name] (significant other) and niece Museum/gallery conservator.  Pt awake today, engaged.  Pt and others confirmed choice of home with hospice, choice document given, they are from [place] and would like [facility].  [name] is primary contact but does not have her phone with her, they asked that Hospice reach out to [employer].  CSW spoke with [place] at [place] and she will contact the family to initiate services.   1530: CSW spoke with [place], [facility].  Family is declining any DME.  They are ready to initiate services as soon as pt can discharge.                Expected Discharge Plan: Home w Hospice Care Barriers to Discharge: No Barriers Identified   Patient Goals and CMS Choice Patient states their goals for this hospitalization and ongoing recovery are:: comfort CMS Medicare.gov Compare Post Acute Care list provided to:: Patient Represenative (must comment) Choice offered to / list presented to : Spouse  Expected Discharge Plan and Services Expected Discharge Plan: Home w Hospice Care In-house Referral: Clinical Social Work Discharge Planning Services: CM Consult Post Acute Care Choice: Hospice Living arrangements for the past 2 months: [place] Date [place]: 04/12/21 Time [place]: 1252 Representative spoke with at [place]: [place]  Prior Living Arrangements/Services Living arrangements for the past 2 months: [place] with:: Significant Other Patient language and need for interpreter reviewed:: Yes Do you feel safe going back to the place  where you live?: Yes      Need for Family Participation in Patient Care: Yes (Comment) Care giver support system in place?: Yes (comment) Current home services: Other (comment) (none) Criminal Activity/Legal Involvement Pertinent to Current Situation/Hospitalization: No - Comment as needed  Activities of Daily Living      Permission Sought/Granted Permission sought to share information with : Family Supports Permission granted to share information with : Yes, Verbal Permission Granted  Share Information with NAME: [name] significant other, [name] niece           Emotional Assessment Appearance:: Appears older than stated age Attitude/Demeanor/Rapport: Unable to Assess Affect (typically observed): Unable to Assess Orientation: : Oriented to Self, Oriented to Place, Oriented to  Time Alcohol / Substance Use: Alcohol Use Psych Involvement: No (comment)  Admission diagnosis:  Respiratory failure ([place]) [J96.90] Tension pneumothorax [J93.0] Hyponatremia [E87.1] Acute respiratory failure with hypoxia (HCC) [J96.01] Patient Active Problem List   Diagnosis Date Noted   Tension pneumothorax 04/08/2021   Respiratory failure ([place]) 04/06/2021   Pressure injury of skin 04/06/2021   Protein-calorie malnutrition, severe 04/06/2021   Port-A-Cath in place 09/04/2020   Squamous cell carcinoma of epiglottis (HCC)    Abdominal pain, epigastric 12/31/2017   Gastric ulcer 08/12/2017   ETOH abuse    Atypical chest pain    Hypercalcemia 10/08/2016   Hypokalemia 10/08/2016   Diarrhea 10/09/2015   Pancreatitis, alcoholic, acute    Alcohol-induced chronic pancreatitis ([place])  Acute pancreatitis    Pancreatitis 12/26/2014   Pancreatic pseudocyst 12/26/2014   Hyponatremia 12/26/2014   HTN (hypertension) 07/27/2013   Abdominal pain 07/11/2012   Macrocytosis 08/17/2011   Marijuana abuse 08/17/2011   Bradycardia 08/17/2011   Alcohol withdrawal ([place]) 08/17/2011   Elevated blood pressure  07/08/2011   Left against medical advice 19/69/4098   Acute alcoholic pancreatitis 28/67/5198   Alcohol abuse, continuous 07/07/2011   Tobacco abuse 07/07/2011   Seizure disorder ([place]) 07/07/2011   Adynamic ileus ([place]) 07/07/2011   Wheezing 07/07/2011   COPD (chronic obstructive pulmonary disease) ([place]) 07/07/2011   PCP:  Jani Gravel, MD Pharmacy:   [facility], [place] [address] [place] [address] Phone: [phone] Fax: [phone]     Social Determinants of Health ([place]) Interventions    Readmission Risk Interventions Readmission Risk Prevention Plan 10/18/2020  Transportation Screening Complete  Home Care Screening Complete  Medication Review (RN CM) Complete  Some recent data might be hidden

## 2021-04-12 NOTE — Progress Notes (Signed)
Daily Progress Note   Patient Name: [name]       Date: 04/12/2021 DOB: 1975/12/10  Age: 45 y.o. MRN#: [id number] Attending Physician: Jose Hazel, MD Primary Care Physician: Jose Gravel, MD Admit Date: 04/06/2021  Reason for Consultation/Follow-up: Establishing goals of care  Subjective: I met with the patient, and his niece, [name], and his girlfriend, [name].  We discussed goals of care. [name] finds joy in being at home or at his friend's house drinking beer and smoking cigarettes.  He also has a cat named [name] that brings him joy.  Prior to admission he was staying at his friend's house pretty much just sitting on the couch all day.  He was not doing his Osmolite 5 times a day as he had been instructed to do due to it just took a lot to get all of the equipment out to do it.  Also he would fill up on the Osmolite and not have room for his beer. In April he had a biopsy of his esophagus that noted necrotic changes likely as a result of treatment that were not healing due to poor nutrition and continued intake of beer and cigarettes. Discussed with patient his poor overall status and his barriers to healing and getting stronger including his severe malnutrition.  I discussed with [name] the options for continued aggressive care treating what is treatable, and noting that he would need to increase his Osmolite intake in order to achieve those goals and if it was something important to him that it is doable but the decision is his.  I also discussed options for comfort measures only, pain medicine, anxiety medicine, Osmolite if he feels like it, comfort feedings.  Stopping labs, maintenance medications, and antibiotics.  Allowing for nature to take its course and he would experience  end-of-life and dying but would be supported with comfort.  Discussed that this was a hospice type care. When I asked [name] how he felt things were going, he expressed "I think I am dying". [name] clearly expressed his preference for comfort measures only.  He wishes to go home with [name] and receive hospice care at home.  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 Symptom management was discussed he has pain in his neck and back he was using oxycodone  at home with some relief.  He also is very anxious at times.  Review of Systems  Constitutional:  Positive for malaise/fatigue and weight loss.  Musculoskeletal:  Positive for back pain.   Length of Stay: 6  Current Medications: Scheduled Meds:  . arformoterol  15 mcg Nebulization BID  . carBAMazepine  400 mg Per Tube BID  . chlorhexidine  15 mL Mouth Rinse BID  . Chlorhexidine Gluconate Cloth  6 each Topical Daily  . feeding supplement (OSMOLITE 1.5 CAL)  237 mL Per Tube 5 X Daily  . levETIRAcetam  1,000 mg Per Tube BID  . mouth rinse  15 mL Mouth Rinse BID  . oxyCODONE  10 mg Per Tube BID  . revefenacin  175 mcg Nebulization Daily  . sodium chloride flush  10-40 mL Intracatheter Q12H  . spiritus frumenti  1 each Oral QPM  . sucralfate  1 g Oral Q6H    Continuous Infusions:   PRN Meds: ipratropium-albuterol, oxyCODONE, sodium chloride flush  Physical Exam Vitals and nursing note reviewed.  Constitutional:      Comments: Extremely frail, cachetic  Pulmonary:     Comments: Increased effort  Skin:    Coloration: Skin is pale.  Neurological:     Mental Status: He is oriented to person, place, and time.  Psychiatric:     Comments: Flat affect            Vital Signs: BP (!) 144/84 (BP Location: Left Arm)   Pulse 100   Temp 98.3 F (36.8 C) (Oral)   Resp 20   Ht _0  (1.702 m)   Wt 43 kg   SpO2 100%   BMI 14.85 kg/m  SpO2: SpO2: 100 % O2 Device: O2 Device: Nasal Cannula O2 Flow Rate: O2 Flow Rate (L/min):  3 L/min  Intake/output summary:  Intake/Output Summary (Last 24 hours) at 04/12/2021 1147 Last data filed at 04/12/2021 2683 Gross per 24 hour  Intake 838.94 ml  Output 1500 ml  Net -661.06 ml   LBM: Last BM Date: 04/11/21 Baseline Weight: Weight: 44.5 kg Most recent weight: Weight: 43 kg       Palliative Assessment/Data: PPS: 20%      Patient Active Problem List   Diagnosis Date Noted  . Tension pneumothorax 04/08/2021  . Respiratory failure ([place]) 04/06/2021  . Pressure injury of skin 04/06/2021  . Protein-calorie malnutrition, severe 04/06/2021  . Port-A-Cath in place 09/04/2020  . 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 Abdominal pain, epigastric 12/31/2017  . Gastric ulcer 08/12/2017  . ETOH abuse   . Atypical chest pain   . Hypercalcemia 10/08/2016  . Hypokalemia 10/08/2016  . Diarrhea 10/09/2015  . Pancreatitis, alcoholic, acute   . Alcohol-induced chronic pancreatitis ([place])   . Acute pancreatitis   . Pancreatitis 12/26/2014  . Pancreatic pseudocyst 12/26/2014  . Hyponatremia 12/26/2014  . HTN (hypertension) 07/27/2013  . Abdominal pain 07/11/2012  . Macrocytosis 08/17/2011  . Marijuana abuse 08/17/2011  . Bradycardia 08/17/2011  . Alcohol withdrawal ([place]) 08/17/2011  . Elevated blood pressure 07/08/2011  . Left against medical advice 07/08/2011  . Acute alcoholic pancreatitis 41/96/2229  . Alcohol abuse, continuous 07/07/2011  . Tobacco abuse 07/07/2011  . Seizure disorder ([place]) 07/07/2011  . Adynamic ileus ([place]) 07/07/2011  . Wheezing 07/07/2011  . COPD (chronic obstructive pulmonary disease) ([place]) 07/07/2011    Palliative Care Assessment & Plan   Patient Profile: 45 y.o. 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  He is s/p chest tube placement with resolution of pneumothorax, but continues to have a poor respiratory status. He has a PEG tube in place.  Palliative medicine consulted for Laurie and code status discussion.   Assessment/Recommendations/Plan  Transition to comfort measures only Oxycodone liquid 27m  via tube BID and q4hr prn for SOB, pain Lorazepam solution 2101mBID and q4rh prn for anxiety Carafate solution- oral q6hrs for throat pain  Goals of Care and Additional Recommendations: Limitations on Scope of Treatment: Full Comfort Care  Code Status: DNR  Prognosis:  < 6 weeks  Discharge Planning: Home with Hospice  Care plan was discussed with patient and his family.  Thank you for allowing the Palliative Medicine Team to assist in the care of this patient.   Total time: 92 mins Prolonged services billed Greater than 50%  of this time was spent counseling and coordinating care related to the above assessment and plan.  KaMariana KaufmanAGNP-C Palliative Medicine   Please contact Palliative Medicine Team phone at [phone] questions and concerns.

## 2021-04-12 NOTE — Progress Notes (Signed)
Seen by palliative care, placed on comfort care, DNR arm band applied.

## 2021-04-12 NOTE — Discharge Summary (Signed)
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  Chest tube catheter was placed and transferred to [facility].  Initially also hyponatremic thought to be due to alcohol use but improved spontaneously.  He has poor respiratory reserve and some signs of recurrent aspiration with cachexia along with deconditioning.  High risk patient therefore palliative care team consulted.  Discharge Diagnoses:  Principal Problem:   Tension pneumothorax Active Problems:   COPD (chronic obstructive pulmonary disease) (HCC)   HTN (hypertension)   Respiratory failure (HCC)   Pressure injury of skin   Protein-calorie malnutrition, severe  Acute on chronic hypoxic respiratory failure secondary to right-sided tension pneumothorax, 3-4 L nasal cannula Bilateral diffuse rhonchi. - Status post chest tube placement 6/9.  Chest tube removed 6/11.  Chest x-ray shows slight worsening infiltrate. - Pt was continued on supplemental O2 - Continue bronchodilators as needed -See below. After discussion with Palliative Care, pt's wishes were noted to be DNR with transition to [place]   Aspiration pneumonia, worsening.  Multifocal - Initially suspicion of chronic aspiration. - Antibiotics were initially continued on Zosyn/Doxy.   -See below, pt's wishes are now full comfort. Abx were discontinued per Palliative Care    Hypokalemia - Focus on comfort measures   Hypoalbuminemia - Likely third spacing.  Received IV albumin   Hyponatremia, 121.  Around his baseline - Suspect due to alcohol use.   -Now focus on comfort measures only   Elevated troponin - Suspect secondary to demand ischemia.  No evidence of ACS.  Continue to monitor. 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 Benjamine Mola. -Now focus is on comfort measures on ly   History of subarachnoid hemorrhage and seizure disorder - On Tegretol and Keppra  Severe protein calorie malnutrition - Dietitian following.  On tube feeds. -Seen by speech.  -Given comfort status  End of Life -Appreciate input by Palliative Care -After family meeting with patient, decision was made for transition to DNR status and Comfort Measures only -Met with pt and family  and pt is eager go home, f/u with Hospice services  Discharge Instructions   Allergies as of 04/12/2021   No Known Allergies      Medication List     STOP taking these medications    acetaminophen 325 MG tablet Commonly known as: TYLENOL   amLODipine 10 MG tablet Commonly known as: NORVASC   chlordiazePOXIDE 25 MG capsule Commonly known as: LIBRIUM   diclofenac Sodium 1 % Gel Commonly known as: VOLTAREN   folic acid 1 MG tablet Commonly known as: FOLVITE   gabapentin 300 MG capsule Commonly known as: NEURONTIN   lidocaine 2 % solution Commonly known as: XYLOCAINE  lidocaine-prilocaine cream Commonly known as: EMLA   multivitamin with minerals Tabs tablet   nicotine 21 mg/24hr patch Commonly known as: NICODERM CQ -  dosed in mg/24 hours   ondansetron 4 MG tablet Commonly known as: ZOFRAN   pantoprazole 40 MG tablet Commonly known as: PROTONIX   phenol 1.4 % Liqd Commonly known as: CHLORASEPTIC   prochlorperazine 10 MG tablet Commonly known as: COMPAZINE   SSD 1 % cream Generic drug: silver sulfADIAZINE   thiamine 100 MG tablet   topiramate 25 MG tablet Commonly known as: Topamax       TAKE these medications    albuterol (2.5 MG/3ML) 0.083% nebulizer solution Commonly known as: PROVENTIL Take 2.5 mg by nebulization every 6 (six) hours as needed for wheezing or shortness of breath. What changed: Another medication with the same name was removed. Continue taking this medication, and follow the directions you see here.   carbamazepine 200 MG tablet Commonly known as: TEGRETOL Take 400 mg by mouth 2 (two) times daily.   feeding supplement (OSMOLITE 1.5 CAL) Liqd Bolus 237 ml Osmolite 1.5 daily with 60 ml H20 before and after feeding via PEG Continuous Osmolite 1.5 -4 cartons (948 ml) from 8 pm to 8 am (12 hours/day) Start continuous feedings @ 30 ml/hr for 12 hours, increase by 10 ml every day to goal rate 80 ml/12 hours 120 ml free water flush before and after continuous feedings Meets 100% calorie needs   levETIRAcetam 500 MG tablet Commonly known as: KEPPRA Take 2 tablets (1,000 mg total) by mouth 2 (two) times daily.   LORazepam 1 MG tablet Commonly known as: Ativan take 1 tablet by mouth twice daily. May take additional tablet every 4 hours as needed for anxiety What changed:  how much to take how to take this when to take this reasons to take this additional instructions   Oxycodone HCl 10 MG Tabs Take 1 tablet (10 mg total) by mouth every 12 (twelve) hours as needed. What changed: when to take this        Follow-up Information     Jani Gravel, MD Follow up.   Specialty: Internal Medicine Why: As needed Contact information: [address]  [place] [address] [phone]                No Known Allergies  Consultations: PCCM Palliative Care  Procedures/Studies: DG CHEST PORT 1 VIEW  Result Date: 04/10/2021 CLINICAL DATA:  Pneumothorax. EXAM: PORTABLE CHEST 1 VIEW COMPARISON:  April 09, 2021. FINDINGS: Slight patient rotation. Similar small right pneumothorax. Multifocal airspace opacities, slightly improved on the right and worsened on the left. Similar small right pleural effusion. Left subclavian approach Port-A-Cath with the tip projecting at the superior cavoatrial junction, similar. Similar cardiomediastinal silhouette. Aortic atherosclerosis. IMPRESSION: 1. Similar small right pneumothorax. 2. Multifocal airspace opacities, slightly improved on the right and worsened on the left. 3. Similar small right pleural effusion. Electronically Signed   By: Margaretha Sheffield MD   On: 04/10/2021 08:21   DG CHEST PORT 1 VIEW  Result Date: 04/09/2021 CLINICAL DATA:  Acute respiratory failure EXAM: PORTABLE CHEST 1 VIEW COMPARISON:  1 day prior FINDINGS: Left-sided Port-A-Cath tip at superior caval/atrial junction. Midline trachea. Mild cardiomegaly. Atherosclerosis in the transverse aorta. Tiny right pleural effusion. 5% right apical pneumothorax is minimally increased. Worsened right mid and upper lung airspace disease. Similar diffuse left-sided and right lower lobe pulmonary opacities. IMPRESSION: Minimal increase in 5% right sided pneumothorax. Worsened multifocal  airspace disease, likely pneumonia. Aortic Atherosclerosis (ICD10-I70.0). Tiny right pleural effusion. Electronically Signed   By: Abigail Miyamoto M.D.   On: 04/09/2021 08:19   DG CHEST PORT 1 VIEW  Result Date: 04/08/2021 CLINICAL DATA:  Acute respiratory failure EXAM: PORTABLE CHEST 1 VIEW COMPARISON:  April 08, 2021 FINDINGS: The cardiomediastinal silhouette is unchanged in contour.LEFT chest port with tip terminating over the superior cavoatrial junction.  Removal of RIGHT-sided chest tube. There is a small RIGHT hydropneumothorax, similar in comparison to prior. Persistent heterogeneous opacification of the LEFT lung. Unchanged interstitial opacities throughout the RIGHT lung base. Visualized abdomen is unremarkable. Multilevel degenerative changes of the thoracic spine. IMPRESSION: Small RIGHT hydropneumothorax, similar in comparison to prior status post RIGHT chest tube removal. Electronically Signed   By: Valentino Saxon MD   On: 04/08/2021 16:35   DG Chest Port 1 View  Result Date: 04/08/2021 CLINICAL DATA:  Abnormal respirations. Recent pneumothorax with right-sided chest tube in place. EXAM: PORTABLE CHEST 1 VIEW COMPARISON:  Chest x-ray 04/07/2021; PET-CT 01/09/2021 FINDINGS: Left subclavian approach single-lumen power injectable port catheter in good position with the tip overlying the superior cavoatrial junction. Right-sided thoracostomy tube in good position overlying the right mid lung. Stable trace residual right apical pneumothorax. Overall inspiratory volumes are low. Progressive patchy and confluent areas of mixed interstitial and airspace opacity throughout the left lung and also developing in the right lower lobe. Trace right pleural effusion. Cardiac and mediastinal contours remain within normal limits. Left apical pleuroparenchymal scarring and bullous change similar compared to prior. No acute osseous abnormality. IMPRESSION: 1. Lower inspiratory volumes with worsening bilateral (left worse than right) patchy foci of mixed interstitial and airspace opacities throughout the entire left lung and within the base of the right lung. Findings raise concern for multifocal versus atypical/viral pneumonia. 2. Stable trace right apical pneumothorax with well-positioned right-sided pigtail thoracostomy tube. Electronically Signed   By: Jacqulynn Cadet M.D.   On: 04/08/2021 09:59   DG CHEST PORT 1 VIEW  Result Date: 04/07/2021 CLINICAL DATA:   Follow-up right-sided pneumothorax EXAM: PORTABLE CHEST 1 VIEW COMPARISON:  Film from earlier in the same day. FINDINGS: Pigtail catheter is again noted on the right. Small apical pneumothorax is again seen and stable. Patchy airspace opacities are identified throughout both lungs consistent with multifocal pneumonia. Left chest wall port is again seen. Cardiac shadow is stable. IMPRESSION: No significant interval change from the prior exam. Electronically Signed   By: Inez Catalina M.D.   On: 04/07/2021 16:28   DG Chest Port 1 View  Result Date: 04/07/2021 CLINICAL DATA:  Abnormal respirations. EXAM: PORTABLE CHEST 1 VIEW COMPARISON:  04/06/2021 FINDINGS: Right chest tube remains in place. Tiny amount of pleural air at the apex, less than seen yesterday. Widespread pulmonary infiltrates persist, more extensive in the left lung than the right, similar to yesterday's study. IMPRESSION: Reduction in size of right pneumothorax. Persistent bilateral pulmonary infiltrates consistent with pneumonia. Electronically Signed   By: Nelson Chimes M.D.   On: 04/07/2021 07:35   DG Chest Port 1 View  Result Date: 04/06/2021 CLINICAL DATA:  Follow-up pneumothorax EXAM: PORTABLE CHEST 1 VIEW COMPARISON:  Earlier today, most recently 3:54 a.m. FINDINGS: Right chest tube remains looped over the right mid chest. The right pneumothorax is seen at the apex and base and is decreased in size with slight reinflation of the right lower lobe. Bilateral pulmonary infiltrates. Porta catheter on the left with tip at the SVC. Normal heart size. IMPRESSION:  Recent right chest tube placement with improving pneumothorax. Electronically Signed   By: Monte Fantasia M.D.   On: 04/06/2021 04:39   DG Chest Portable 1 View  Result Date: 04/06/2021 CLINICAL DATA:  Chest tube placement EXAM: PORTABLE CHEST 1 VIEW COMPARISON:  Earlier today FINDINGS: Right chest tube which is looped over the lateral chest. The right pneumothorax is large and  increased from before. There is in progress chest x-ray. Reticulonodular density bilaterally from cavitary airspace disease. The right lower lobe is collapsed. Normal heart size. Left port with tip at the lower SVC. IMPRESSION: New right chest tube but mildly increased right pneumothorax which is large. Electronically Signed   By: Monte Fantasia M.D.   On: 04/06/2021 04:31   DG Chest Portable 1 View  Result Date: 04/06/2021 CLINICAL DATA:  Dyspnea EXAM: PORTABLE CHEST 1 VIEW COMPARISON:  11/20/2020 FINDINGS: Extensive multifocal infiltrate has developed throughout the left lung and likely within the right lung base most in keeping with multifocal pneumonia in the acute setting. Focal lucency within an area of a patchy consolidation at the left lung base may represent a focal area of cavitation, as can be seen with necrotizing pneumonia. Large right pneumothorax is present. There is tension physiology present with expansion of the right hemithorax, mild mediastinal shift to the left, and mild depression of the right hemidiaphragm. No pleural effusion. Cardiac size within normal limits. Left subclavian chest port tip is seen at the superior cavoatrial junction. No acute bone abnormality. IMPRESSION: Large right tension pneumothorax. Multifocal pulmonary infiltrates, possibly with foci of cavitation most in keeping with multifocal possibly necrotizing pneumonia in the acute setting. Attempts are being made at this time to contact the managing clinician for direct verbal communication of these findings. Electronically Signed   By: Fidela Salisbury MD   On: 04/06/2021 03:21   DG Swallowing Func-Speech Pathology  Result Date: 04/07/2021 Formatting of this result is different from the original. Objective Swallowing Evaluation: Type of Study: MBS-Modified Barium Swallow Study  Patient Details Name: [name] MRN: [id number] Date of Birth: Mar 12, 1976 Today's Date: 04/07/2021 Time: SLP Start Time (ACUTE ONLY): 45  -SLP Stop Time (ACUTE ONLY): 1340 SLP Time Calculation (min) (ACUTE ONLY): 21 min Past Medical History: Past Medical History: Diagnosis Date  Back pain   Bradycardia 08/17/2011  Cancer ([place])   COPD (chronic obstructive pulmonary disease) (HCC)   ETOH abuse   GERD (gastroesophageal reflux disease)   Hypertension   Macrocytosis 08/17/2011  Pancreatitis   Pneumonia   Port-A-Cath in place 09/04/2020  SAH (subarachnoid hemorrhage) ([place])   Seizures ([place])   last one 6 mos ago Past Surgical History: Past Surgical History: Procedure Laterality Date  BACK SURGERY    DIRECT LARYNGOSCOPY N/A 02/20/2021  Procedure: DIRECT LARYNGOSCOPY WITH BIOPSY;  Surgeon: Leta Baptist, MD;  Location: [facility] OR;  Service: ENT;  Laterality: N/A;  ESOPHAGOGASTRODUODENOSCOPY (EGD) WITH PROPOFOL N/A 08/14/2017  Procedure: ESOPHAGOGASTRODUODENOSCOPY (EGD) WITH PROPOFOL;  Surgeon: Daneil Dolin, MD;  Location: [facility] SUITE;  Service: Endoscopy;  Laterality: N/A;  1:30pm  ESOPHAGOGASTRODUODENOSCOPY (EGD) WITH PROPOFOL N/A 03/13/2018  Procedure: ESOPHAGOGASTRODUODENOSCOPY (EGD) WITH PROPOFOL;  Surgeon: Daneil Dolin, MD;  Location: [facility];  Service: Endoscopy;  Laterality: N/A;  2:30pm  ESOPHAGOGASTRODUODENOSCOPY (EGD) WITH PROPOFOL N/A 08/22/2020  Procedure: ESOPHAGOGASTRODUODENOSCOPY (EGD) WITH PROPOFOL;  Surgeon: Aviva Signs, MD;  Location: [facility] ORS;  Service: General;  Laterality: N/A;  PEG PLACEMENT N/A 08/22/2020  Procedure: PERCUTANEOUS ENDOSCOPIC GASTROSTOMY (PEG) PLACEMENT;  Surgeon: Aviva Signs,  MD;  Location: [facility] ORS;  Service: General;  Laterality: N/A;  PORTACATH PLACEMENT Left 08/22/2020  Procedure: INSERTION PORT-A-CATH;  Surgeon: Aviva Signs, MD;  Location: [facility] ORS;  Service: General;  Laterality: Left;  SEPTOPLASTY   HPI: 45 year-old male who was 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  He underwent radiation and chemotherapy completed 10/2020. PEG placed 07/2020.  Per chart PET scan revealed hypermetabolic  state of the epiglottis. hypermetabolic state of the epiglottis. Direct laryngoscopy 02/20/21 with ENT revealed partial erosion of the epiglottis, with necrotic debris covering the rest of the epiglottis, biopsy taken did not reveal any cancerous cells. Pt presented to [facility] increasing shortness of breath. Chest x-ray with possible multifocal pneumonia. CXR revealed Large right tension pneumothorax. Multifocal pulmonary infiltrates, possibly with foci of cavitation  most in keeping with multifocal possibly necrotizing pneumonia. MBS 08/23/20 (prior to treatment) revealed penetration and aspiration after swallow without awareness with wet vocal quality and throat clearing, residue greater with nectar. Rec'd Dys 3, thin, cough post swallow, dysphagia treatment with swallow exercises.  No data recorded Assessment / Plan / Recommendation CHL IP CLINICAL IMPRESSIONS 04/07/2021 Clinical Impression Pt exhibits severe pharyngeal dysphagia significantly worsened since radiation/chemo treatement. MBS 07/2020 and today's were compared revealing gross aspiration of thin, nectar and puree consistencies barium. His epiglottis was not appreciated during today's study cooresponding to results of direct laryngoscopy 4/45/22 which revealed partial erosion of the epiglottis, with necrotic debris. There was no movement of hyoid during swallows with thin, nectar and minimal anterior elevation. Barium directed into laryngeal vestiuble onto vocal cords and aspirated silently with thin and nectar without awareness. Minimal barium was able to pass UES into esophagus with thin. Maximum puree residue at pyriform sinuses that fell onto vocal cords initiating a throat clear that propelled back to oral cavity to be suctioned. Therapist cued pt to hold breath when material in oral cavity just prior to swallow. This was challenging for pt and he was unable to demonstrate. It is recommended that he continue NPO status using PEG for nutrition allowing  ice chips after oral care intermittently.He is significantly malnourished with inadequate reserve. Recommend thorough oral care. Palliative care may be beneficial for pt. SLP can each pt pharyngeal exercises to decrease laryngeal/pharyngeal atrophy and fibrotic tissue. SLP Visit Diagnosis -- Attention and concentration deficit following -- Frontal lobe and executive function deficit following -- Impact on safety and function --   CHL IP TREATMENT RECOMMENDATION 04/06/2021 Treatment Recommendations Therapy as outlined in treatment plan below   Prognosis 04/06/2021 Prognosis for Safe Diet Advancement (No Data) Barriers to Reach Goals (No Data) Barriers/Prognosis Comment -- No flowsheet data found.  No flowsheet data found.  CHL IP FOLLOW UP RECOMMENDATIONS 04/06/2021 Follow up Recommendations Other (comment)   CHL IP FREQUENCY AND DURATION 04/06/2021 Speech Therapy Frequency (ACUTE ONLY) min 2x/week Treatment Duration 2 weeks      CHL IP ORAL PHASE 04/07/2021 Oral Phase WFL Oral - Pudding Teaspoon -- Oral - Pudding Cup -- Oral - Honey Teaspoon -- Oral - Honey Cup -- Oral - Nectar Teaspoon -- Oral - Nectar Cup -- Oral - Nectar Straw -- Oral - Thin Teaspoon -- Oral - Thin Cup -- Oral - Thin Straw -- Oral - Puree -- Oral - Mech Soft -- Oral - Regular -- Oral - Multi-Consistency -- Oral - Pill -- Oral Phase - Comment --  CHL IP PHARYNGEAL PHASE 04/07/2021 Pharyngeal Phase Impaired Pharyngeal- Pudding Teaspoon -- Pharyngeal -- Pharyngeal- Pudding  Cup -- Pharyngeal -- Pharyngeal- Honey Teaspoon -- Pharyngeal -- Pharyngeal- Honey Cup -- Pharyngeal -- Pharyngeal- Nectar Teaspoon Penetration/Aspiration before swallow;Penetration/Aspiration during swallow;Pharyngeal residue - pyriform;Reduced pharyngeal peristalsis;Reduced epiglottic inversion;Reduced anterior laryngeal mobility;Reduced laryngeal elevation;Reduced airway/laryngeal closure;Reduced tongue base retraction Pharyngeal Material enters airway, passes BELOW cords without attempt  by patient to eject out (silent aspiration) Pharyngeal- Nectar Cup -- Pharyngeal -- Pharyngeal- Nectar Straw -- Pharyngeal -- Pharyngeal- Thin Teaspoon Penetration/Aspiration before swallow;Penetration/Aspiration during swallow;Pharyngeal residue - pyriform;Reduced pharyngeal peristalsis Pharyngeal Material enters airway, passes BELOW cords without attempt by patient to eject out (silent aspiration) Pharyngeal- Thin Cup -- Pharyngeal -- Pharyngeal- Thin Straw -- Pharyngeal -- Pharyngeal- Puree Penetration/Aspiration before swallow;Penetration/Aspiration during swallow;Pharyngeal residue - pyriform;Reduced pharyngeal peristalsis;Reduced tongue base retraction;Reduced airway/laryngeal closure;Reduced epiglottic inversion;Reduced anterior laryngeal mobility;Reduced laryngeal elevation Pharyngeal Material enters airway, passes BELOW cords without attempt by patient to eject out (silent aspiration) Pharyngeal- Mechanical Soft -- Pharyngeal -- Pharyngeal- Regular -- Pharyngeal -- Pharyngeal- Multi-consistency -- Pharyngeal -- Pharyngeal- Pill -- Pharyngeal -- Pharyngeal Comment --  CHL IP CERVICAL ESOPHAGEAL PHASE 04/07/2021 Cervical Esophageal Phase Impaired Pudding Teaspoon -- Pudding Cup -- Honey Teaspoon -- Honey Cup -- Nectar Teaspoon Reduced cricopharyngeal relaxation Nectar Cup -- Nectar Straw -- Thin Teaspoon -- Thin Cup -- Thin Straw -- Puree -- Mechanical Soft -- Regular -- Multi-consistency -- Pill -- Cervical Esophageal Comment -- [place] Siren 04/07/2021, 3:00 PM              ECHOCARDIOGRAM COMPLETE  Result Date: 04/07/2021    ECHOCARDIOGRAM REPORT   Patient Name:   [name] Date of Exam: 04/07/2021 Medical Rec #:  [id number]        Height:       67.0 in Accession #:    [id number]       Weight:       94.8 lb Date of Birth:  April 13, 1976        BSA:          1.472 m Patient Age:    56 years         BP:           108/78 mmHg Patient Gender: M                HR:           97 bpm. Exam Location:   Inpatient Procedure: 2D Echo, Cardiac Doppler and Color Doppler Indications:    CHF  History:        Patient has no prior history of Echocardiogram examinations.                 COPD; Risk Factors:Hypertension.  Sonographer:    Maudry Mayhew MHA, RDMS, RVT, RDCS Referring Phys: [place]  1. Left ventricular ejection fraction, by estimation, is 50 to 55%. The left ventricle has low normal function. The left ventricle has no regional wall motion abnormalities. Left ventricular diastolic parameters were normal.  2. Right ventricular systolic function is mildly reduced. The right ventricular size is normal. There is mildly elevated pulmonary artery systolic pressure. The estimated right ventricular systolic pressure is 82.9 mmHg.  3. The mitral valve is normal in structure. Trivial mitral valve regurgitation. No evidence of mitral stenosis.  4. The aortic valve was not well visualized. Aortic valve regurgitation is not visualized. Mild aortic valve sclerosis is present, with no evidence of aortic valve stenosis.  5. The inferior vena cava is normal in size with <50% respiratory variability, suggesting  right atrial pressure of 8 mmHg. FINDINGS  Left Ventricle: Left ventricular ejection fraction, by estimation, is 50 to 55%. The left ventricle has low normal function. The left ventricle has no regional wall motion abnormalities. Definity contrast agent was given IV to delineate the left ventricular endocardial borders. The left ventricular internal cavity size was normal in size. There is no left ventricular hypertrophy. Left ventricular diastolic parameters were normal. Normal left ventricular filling pressure. Right Ventricle: The right ventricular size is normal. No increase in right ventricular wall thickness. Right ventricular systolic function is mildly reduced. There is mildly elevated pulmonary artery systolic pressure. The tricuspid regurgitant velocity  is 2.89 m/s, and with an  assumed right atrial pressure of 8 mmHg, the estimated right ventricular systolic pressure is 32.2 mmHg. Left Atrium: Left atrial size was normal in size. Right Atrium: Right atrial size was normal in size. Pericardium: There is no evidence of pericardial effusion. Mitral Valve: The mitral valve is normal in structure. Mild mitral annular calcification. Trivial mitral valve regurgitation. No evidence of mitral valve stenosis. Tricuspid Valve: The tricuspid valve is normal in structure. Tricuspid valve regurgitation is trivial. No evidence of tricuspid stenosis. Aortic Valve: The aortic valve was not well visualized. Aortic valve regurgitation is not visualized. Mild aortic valve sclerosis is present, with no evidence of aortic valve stenosis. Aortic valve mean gradient measures 2.0 mmHg. Aortic valve peak gradient measures 4.4 mmHg. Aortic valve area, by VTI measures 4.82 cm. Pulmonic Valve: The pulmonic valve was normal in structure. Pulmonic valve regurgitation is not visualized. No evidence of pulmonic stenosis. Aorta: The aortic root is normal in size and structure. Venous: The inferior vena cava is normal in size with less than 50% respiratory variability, suggesting right atrial pressure of 8 mmHg. IAS/Shunts: No atrial level shunt detected by color flow Doppler.  LEFT VENTRICLE PLAX 2D LVIDd:         3.90 cm     Diastology LVIDs:         2.80 cm     LV e' medial:    9.75 cm/s LV PW:         0.90 cm     LV E/e' medial:  9.0 LV IVS:        0.90 cm     LV e' lateral:   17.50 cm/s LVOT diam:     2.30 cm     LV E/e' lateral: 5.0 LV SV:         86 LV SV Index:   59 LVOT Area:     4.15 cm  LV Volumes (MOD) LV vol d, MOD A2C: 89.4 ml LV vol d, MOD A4C: 36.2 ml LV vol s, MOD A2C: 52.5 ml LV vol s, MOD A4C: 38.1 ml LV SV MOD A2C:     36.9 ml LV SV MOD A4C:     36.2 ml LV SV MOD BP:      12.9 ml RIGHT VENTRICLE RV S prime:     6.31 cm/s TAPSE (M-mode): 1.6 cm LEFT ATRIUM             Index       RIGHT ATRIUM            Index LA diam:        3.00 cm 2.04 cm/m  RA Area:     12.00 cm LA Vol (A2C):   43.6 ml 29.58 ml/m RA Volume:   30.60 ml  20.78 ml/m LA Vol (A4C):  28.2 ml 19.15 ml/m LA Biplane Vol: 40.2 ml 27.30 ml/m  AORTIC VALVE AV Area (Vmax):    2.83 cm AV Area (Vmean):   2.87 cm AV Area (VTI):     4.82 cm AV Vmax:           105.00 cm/s AV Vmean:          66.100 cm/s AV VTI:            0.179 m AV Peak Grad:      4.4 mmHg AV Mean Grad:      2.0 mmHg LVOT Vmax:         71.45 cm/s LVOT Vmean:        45.650 cm/s LVOT VTI:          0.208 m LVOT/AV VTI ratio: 1.16  AORTA Ao Root diam: 3.20 cm MITRAL VALVE               TRICUSPID VALVE MV Area (PHT): 2.76 cm    TR Peak grad:   33.4 mmHg MV Decel Time: 275 msec    TR Vmax:        289.00 cm/s MR Peak grad: 23.0 mmHg MR Vmax:      240.00 cm/s  SHUNTS MV E velocity: 87.30 cm/s  Systemic VTI:  0.21 m MV A velocity: 27.70 cm/s  Systemic Diam: 2.30 cm MV E/A ratio:  3.15 Fransico Him MD Electronically signed by Fransico Him MD Signature Date/Time: 04/07/2021/2:16:56 PM    Final     Subjective: Eager to go home  Discharge Exam: Vitals:   04/12/21 0808 04/12/21 1144  BP:  (!) 144/84  Pulse: (!) 110 100  Resp: (!) 28 20  Temp:  98.3 F (36.8 C)  SpO2: 100% 100%   Vitals:   04/12/21 0516 04/12/21 0742 04/12/21 0808 04/12/21 1144  BP: (!) 159/93 (!) 152/79  (!) 144/84  Pulse: (!) 110 (!) 113 (!) 110 100  Resp: 20 20 (!) 28 20  Temp: 98.2 F (36.8 C) 98 F (36.7 C)  98.3 F (36.8 C)  TempSrc: Oral Oral  Oral  SpO2: 98% 94% 100% 100%  Weight:      Height:        General: Pt is alert, awake, not in acute distress Cardiovascular: RRR, S1/S2 + Respiratory: CTA bilaterally, no wheezing, no rhonchi Abdominal: Soft, NT, ND, bowel sounds + Extremities: no edema, no cyanosis   The results of significant diagnostics from this hospitalization (including imaging, microbiology, ancillary and laboratory) are listed below for reference.     Microbiology: Recent  Results (from the past 240 hour(s))  Culture, blood (single) w Reflex to ID Panel     Status: None   Collection Time: 04/06/21  2:33 AM   Specimen: BLOOD  Result Value Ref Range Status   Specimen Description BLOOD PORTA CATH  Final   Special Requests   Final    BOTTLES DRAWN AEROBIC AND ANAEROBIC Blood Culture adequate volume   Culture   Final    NO GROWTH 5 DAYS Performed at [facility], [address], [place], [place] [address]    Report Status 04/11/2021 FINAL  Final  Resp Panel by RT-PCR (Flu A&B, Covid) Nasopharyngeal Swab     Status: None   Collection Time: 04/06/21  4:04 AM   Specimen: Nasopharyngeal Swab; Nasopharyngeal(NP) swabs in vial transport medium  Result Value Ref Range Status   SARS Coronavirus 2 by RT PCR NEGATIVE NEGATIVE Final    Comment: (NOTE)  SARS-CoV-2 target nucleic acids are NOT DETECTED.  The SARS-CoV-2 RNA is generally detectable in upper respiratory specimens during the acute phase of infection. The lowest concentration of SARS-CoV-2 viral copies this assay can detect is 138 copies/mL. A negative result does not preclude SARS-Cov-2 infection and should not be used as the sole basis for treatment or other patient management decisions. A negative result may occur with  improper specimen collection/handling, submission of specimen other than nasopharyngeal swab, presence of viral mutation(s) within the areas targeted by this assay, and inadequate number of viral copies(<138 copies/mL). A negative result must be combined with clinical observations, patient history, and epidemiological information. The expected result is Negative.  Fact Sheet for Patients:  EntrepreneurPulse.com.au  Fact Sheet for Healthcare Providers:  IncredibleEmployment.be  This test is no t yet approved or cleared by the [place] FDA and  has been authorized for detection and/or diagnosis of SARS-CoV-2 by FDA under an Emergency Use  Authorization (EUA). This EUA will remain  in effect (meaning this test can be used) for the duration of the COVID-19 declaration under Section 564(b)(1) of the Act, 21 U.S.C.section 360bbb-3(b)(1), unless the authorization is terminated  or revoked sooner.       Influenza A by PCR NEGATIVE NEGATIVE Final   Influenza B by PCR NEGATIVE NEGATIVE Final    Comment: (NOTE) The Xpert Xpress SARS-CoV-2/FLU/RSV plus assay is intended as an aid in the diagnosis of influenza from Nasopharyngeal swab specimens and should not be used as a sole basis for treatment. Nasal washings and aspirates are unacceptable for Xpert Xpress SARS-CoV-2/FLU/RSV testing.  Fact Sheet for Patients: EntrepreneurPulse.com.au  Fact Sheet for Healthcare Providers: IncredibleEmployment.be  This test is not yet approved or cleared by the [place] FDA and has been authorized for detection and/or diagnosis of SARS-CoV-2 by FDA under an Emergency Use Authorization (EUA). This EUA will remain in effect (meaning this test can be used) for the duration of the COVID-19 declaration under Section 564(b)(1) of the Act, 21 U.S.C. section 360bbb-3(b)(1), unless the authorization is terminated or revoked.  Performed at [facility], [address], [place], [place] [address]   MRSA PCR Screening     Status: None   Collection Time: 04/06/21  6:31 AM   Specimen: Nasal Mucosa; Nasopharyngeal  Result Value Ref Range Status   MRSA by PCR NEGATIVE NEGATIVE Final    Comment:        The GeneXpert MRSA Assay (FDA approved for NASAL specimens only), is one component of a comprehensive MRSA colonization surveillance program. It is not intended to diagnose MRSA infection nor to guide or monitor treatment for MRSA infections. Performed at [facility], [place] [address], [place], [place] [address]      Labs: BNP (last 3 results) Recent Labs    04/06/21 0233 04/10/21 0718  BNP  624.0* 3,614.4*   Basic Metabolic Panel: Recent Labs  Lab 04/06/21 1107 04/07/21 0243 04/07/21 2028 04/08/21 0343 04/08/21 2117 04/09/21 0233 04/10/21 0718 04/10/21 1621 04/11/21 0046 04/12/21 0434  NA  --  121*  --  121*   < > 121* 121* 121* 120* 122*  K  --  3.2*  --  3.6   < > 4.3 2.4* 3.6 2.9* 3.3*  CL  --  79*  --  79*   < > 84* 78* 79* 79* 83*  CO2  --  32  --  29   < > 31 35* 33* 30 27  GLUCOSE  --  115*  --  110*   < > 105* 102* 103* 188* 109*  BUN  --  <5*  --  10   < > 5* 6 6 5* 6  CREATININE  --  <0.30*  --  0.32*   < > <0.30* <0.30* <0.30* 0.32* <0.30*  CALCIUM  --  8.0*  --  8.3*   < > 8.0* 8.0* 8.5* 8.4* 8.3*  MG 1.5* 1.9  --  1.9  --  1.4* 1.7  --  1.7 1.6*  PHOS 2.4* 1.6* 2.2* 1.9*  --   --   --   --   --   --    < > = values in this interval not displayed.   Liver Function Tests: Recent Labs  Lab 04/06/21 0849 04/07/21 0243 04/09/21 0233 04/10/21 0718 04/11/21 0046  AST 1,484* 666* 73* 43* 37  ALT 472* 345* 139* 85* 59*  ALKPHOS 87 66 79 79 62  BILITOT 0.5 0.5 0.4 0.2* 0.3  PROT 5.4* 5.5* 5.4* 5.6* 5.7*  ALBUMIN 2.4* 2.3* 2.1* 2.0* 2.5*   No results for input(s): LIPASE, AMYLASE in the last 168 hours. No results for input(s): AMMONIA in the last 168 hours. CBC: Recent Labs  Lab 04/07/21 0243 04/09/21 0233 04/10/21 0718 04/11/21 0046 04/12/21 0434  WBC 17.0* 12.2* 10.0 5.8 6.8  NEUTROABS 15.2*  --   --   --   --   HGB 11.2* 10.6* 11.7* 9.6* 9.8*  HCT 31.0* 30.5* 33.5* 27.6* 27.6*  MCV 95.4 98.4 95.2 95.5 94.8  PLT 148* 127* 115* 107* 97*   Cardiac Enzymes: No results for input(s): CKTOTAL, CKMB, CKMBINDEX, TROPONINI in the last 168 hours. BNP: Invalid input(s): POCBNP CBG: Recent Labs  Lab 04/06/21 2319 04/07/21 0359 04/07/21 0739 04/07/21 1145 04/07/21 1613  GLUCAP 259* 84 135* 248* 194*   D-Dimer No results for input(s): DDIMER in the last 72 hours. Hgb A1c No results for input(s): HGBA1C in the last 72 hours. Lipid  Profile No results for input(s): CHOL, HDL, LDLCALC, TRIG, CHOLHDL, LDLDIRECT in the last 72 hours. Thyroid function studies No results for input(s): TSH, T4TOTAL, T3FREE, THYROIDAB in the last 72 hours.  Invalid input(s): FREET3 Anemia work up No results for input(s): VITAMINB12, FOLATE, FERRITIN, TIBC, IRON, RETICCTPCT in the last 72 hours. Urinalysis    Component Value Date/Time   COLORURINE YELLOW 10/17/2020 2143   APPEARANCEUR HAZY (A) 10/17/2020 2143   LABSPEC 1.008 10/17/2020 2143   PHURINE 8.0 10/17/2020 2143   GLUCOSEU >=500 (A) 10/17/2020 2143   HGBUR NEGATIVE 10/17/2020 2143   BILIRUBINUR NEGATIVE 10/17/2020 2143   KETONESUR NEGATIVE 10/17/2020 2143   PROTEINUR NEGATIVE 10/17/2020 2143   UROBILINOGEN 0.2 07/18/2015 0549   NITRITE NEGATIVE 10/17/2020 2143   LEUKOCYTESUR NEGATIVE 10/17/2020 2143   Sepsis Labs Invalid input(s): PROCALCITONIN,  WBC,  LACTICIDVEN Microbiology Recent Results (from the past 240 hour(s))  Culture, blood (single) w Reflex to ID Panel     Status: None   Collection Time: 04/06/21  2:33 AM   Specimen: BLOOD  Result Value Ref Range Status   Specimen Description BLOOD PORTA CATH  Final   Special Requests   Final    BOTTLES DRAWN AEROBIC AND ANAEROBIC Blood Culture adequate volume   Culture   Final    NO GROWTH 5 DAYS Performed at [facility], [address], [place], [place] [address]    Report Status 04/11/2021 FINAL  Final  Resp Panel by RT-PCR (Flu A&B, Covid) Nasopharyngeal  Swab     Status: None   Collection Time: 04/06/21  4:04 AM   Specimen: Nasopharyngeal Swab; Nasopharyngeal(NP) swabs in vial transport medium  Result Value Ref Range Status   SARS Coronavirus 2 by RT PCR NEGATIVE NEGATIVE Final    Comment: (NOTE) SARS-CoV-2 target nucleic acids are NOT DETECTED.  The SARS-CoV-2 RNA is generally detectable in upper respiratory specimens during the acute phase of infection. The lowest concentration of SARS-CoV-2 viral copies this  assay can detect is 138 copies/mL. A negative result does not preclude SARS-Cov-2 infection and should not be used as the sole basis for treatment or other patient management decisions. A negative result may occur with  improper specimen collection/handling, submission of specimen other than nasopharyngeal swab, presence of viral mutation(s) within the areas targeted by this assay, and inadequate number of viral copies(<138 copies/mL). A negative result must be combined with clinical observations, patient history, and epidemiological information. The expected result is Negative.  Fact Sheet for Patients:  EntrepreneurPulse.com.au  Fact Sheet for Healthcare Providers:  IncredibleEmployment.be  This test is no t yet approved or cleared by the [place] FDA and  has been authorized for detection and/or diagnosis of SARS-CoV-2 by FDA under an Emergency Use Authorization (EUA). This EUA will remain  in effect (meaning this test can be used) for the duration of the COVID-19 declaration under Section 564(b)(1) of the Act, 21 U.S.C.section 360bbb-3(b)(1), unless the authorization is terminated  or revoked sooner.       Influenza A by PCR NEGATIVE NEGATIVE Final   Influenza B by PCR NEGATIVE NEGATIVE Final    Comment: (NOTE) The Xpert Xpress SARS-CoV-2/FLU/RSV plus assay is intended as an aid in the diagnosis of influenza from Nasopharyngeal swab specimens and should not be used as a sole basis for treatment. Nasal washings and aspirates are unacceptable for Xpert Xpress SARS-CoV-2/FLU/RSV testing.  Fact Sheet for Patients: EntrepreneurPulse.com.au  Fact Sheet for Healthcare Providers: IncredibleEmployment.be  This test is not yet approved or cleared by the [place] FDA and has been authorized for detection and/or diagnosis of SARS-CoV-2 by FDA under an Emergency Use Authorization (EUA). This EUA will  remain in effect (meaning this test can be used) for the duration of the COVID-19 declaration under Section 564(b)(1) of the Act, 21 U.S.C. section 360bbb-3(b)(1), unless the authorization is terminated or revoked.  Performed at [facility], [address] [place], [place] [address]   MRSA PCR Screening     Status: None   Collection Time: 04/06/21  6:31 AM   Specimen: Nasal Mucosa; Nasopharyngeal  Result Value Ref Range Status   MRSA by PCR NEGATIVE NEGATIVE Final    Comment:        The GeneXpert MRSA Assay (FDA approved for NASAL specimens only), is one component of a comprehensive MRSA colonization surveillance program. It is not intended to diagnose MRSA infection nor to guide or monitor treatment for MRSA infections. Performed at [facility], [place] [address], [place], [place] [address]    Time spent: 30 min  SIGNED:   Marylu Lund, MD  [facility] 04/12/2021, 4:23 PM  If 7PM-7AM, please contact night-coverage

## 2021-04-12 NOTE — Progress Notes (Signed)
This chaplain is present for F/U spiritual care.  The Pt. is joined by family at the bedside who are thankful visitor restrictions are less restrictive.  The Pt. and family are looking forward to the Pt. coming home with Hospice care.   The family asked questions about the level of Hospice care at home.  The chaplain encouraged them to [name] or [name] for answers to their questions and then the Hospice liaison. The Pt. communicates with the family "I am uncomfortable" as the family shares the day's events and the Pt. change to DNR.  This chaplain will F/U with spiritual care on Thursday.

## 2021-04-12 NOTE — Progress Notes (Signed)
All set for discharge home, awaiting delivery of o2 tank.

## 2021-04-12 NOTE — Progress Notes (Addendum)
This chaplain responded to PMT consult for notarizing the Pt. HCPOA.  The Pt. significant other-[name] and niece-[name] are at the Pt. Bedside along with PMT-NP Mariana Kaufman.  The chaplain understands the Pt. is ready to proceed with notarizing HCPOA.  The Pt. named [name] as his healthcare agent.  [name] Little will serve in this role if the healthcare agent is unable or unwilling to serve.  The chaplain gave the Pt. the original and two copies of the AD. A copy of the Advance Directive was scanned into the Pt. EMR.  The chaplain offered spiritual and emotional support through reflective listening.  The chaplain understands the Pt. family is often a source of love for the Pt.   The Pt. and family accepted the chaplain's invitation for F/U spiritual care and prayer.

## 2021-04-12 NOTE — TOC Transition Note (Signed)
Transition of Care [facility]) - CM/SW Discharge Note   Patient Details  Name: [name] MRN: [id number] Date of Birth: December 05, 1975  [facility] of [facility]) CM/SW Contact:  Joanne Chars, LCSW Phone Number: 04/12/2021, 5:12 PM   Clinical Narrative:   Pt discharging home with [facility].  Family has asked to transport pt home so PTAR will not be used.  Pt needs home O2 tank for transport home.  [facility] is O2 provider, CSW spoke with Ashely at [facility] who researched the client and found that pt was night O2 only, has concentrator at home but no tanks.  Ashely contacted [facility] who agreed to obtain O2 through their contracted provider, [employer], who will deliver shortly.     Final next level of care: Home w Hospice Care Barriers to Discharge: No Barriers Identified   Patient Goals and CMS Choice Patient states their goals for this hospitalization and ongoing recovery are:: comfort CMS Medicare.gov Compare Post Acute Care list provided to:: Patient Represenative (must comment) Choice offered to / list presented to : Spouse  Discharge Placement                       Discharge Plan and Services In-house Referral: Clinical Social Work Discharge Planning Services: CM Consult Post Acute Care Choice: Hospice          DME Arranged: N/A (Family declined DME through [facility])           [place] Date [place]: 04/12/21 Time [place]: 1252 Representative spoke with at [place]: [place] Determinants of Health ([place]) Interventions     Readmission Risk Interventions Readmission Risk Prevention Plan 10/18/2020  Transportation Screening Complete  Home Care Screening Complete  Medication Review (RN CM) Complete  Some recent data might be hidden

## 2021-04-28 DEATH — deceased

## 2021-07-11 ENCOUNTER — Ambulatory Visit (HOSPITAL_COMMUNITY): Payer: Medicaid Other

## 2021-07-11 ENCOUNTER — Other Ambulatory Visit (HOSPITAL_COMMUNITY): Payer: Medicaid Other

## 2021-07-19 ENCOUNTER — Ambulatory Visit (HOSPITAL_COMMUNITY): Payer: Medicaid Other | Admitting: Hematology

## 2021-09-28 ENCOUNTER — Encounter (HOSPITAL_COMMUNITY): Payer: Self-pay | Admitting: Speech Pathology
